# Patient Record
Sex: Female | Born: 1960 | Race: Black or African American | Hispanic: No | Marital: Married | Smoking: Former smoker
Health system: Southern US, Community
[De-identification: ages and names within clinical notes are randomized; demographics above are authoritative.]

## PROBLEM LIST (undated history)

## (undated) DIAGNOSIS — R079 Chest pain, unspecified: Secondary | ICD-10-CM

## (undated) DIAGNOSIS — I10 Essential (primary) hypertension: Secondary | ICD-10-CM

## (undated) DIAGNOSIS — C50919 Malignant neoplasm of unspecified site of unspecified female breast: Secondary | ICD-10-CM

## (undated) DIAGNOSIS — K529 Noninfective gastroenteritis and colitis, unspecified: Secondary | ICD-10-CM

## (undated) DIAGNOSIS — F329 Major depressive disorder, single episode, unspecified: Secondary | ICD-10-CM

## (undated) DIAGNOSIS — B009 Herpesviral infection, unspecified: Secondary | ICD-10-CM

## (undated) DIAGNOSIS — F32A Depression, unspecified: Secondary | ICD-10-CM

## (undated) DIAGNOSIS — T7840XA Allergy, unspecified, initial encounter: Secondary | ICD-10-CM

## (undated) DIAGNOSIS — R002 Palpitations: Secondary | ICD-10-CM

## (undated) DIAGNOSIS — L509 Urticaria, unspecified: Secondary | ICD-10-CM

## (undated) DIAGNOSIS — M1990 Unspecified osteoarthritis, unspecified site: Secondary | ICD-10-CM

## (undated) DIAGNOSIS — Z9221 Personal history of antineoplastic chemotherapy: Secondary | ICD-10-CM

## (undated) DIAGNOSIS — D649 Anemia, unspecified: Secondary | ICD-10-CM

## (undated) DIAGNOSIS — R011 Cardiac murmur, unspecified: Secondary | ICD-10-CM

## (undated) DIAGNOSIS — E7800 Pure hypercholesterolemia, unspecified: Secondary | ICD-10-CM

## (undated) DIAGNOSIS — R519 Headache, unspecified: Secondary | ICD-10-CM

## (undated) DIAGNOSIS — R51 Headache: Secondary | ICD-10-CM

## (undated) DIAGNOSIS — E119 Type 2 diabetes mellitus without complications: Secondary | ICD-10-CM

## (undated) DIAGNOSIS — Z973 Presence of spectacles and contact lenses: Secondary | ICD-10-CM

## (undated) DIAGNOSIS — G039 Meningitis, unspecified: Secondary | ICD-10-CM

## (undated) DIAGNOSIS — F419 Anxiety disorder, unspecified: Secondary | ICD-10-CM

## (undated) DIAGNOSIS — K219 Gastro-esophageal reflux disease without esophagitis: Secondary | ICD-10-CM

## (undated) DIAGNOSIS — I499 Cardiac arrhythmia, unspecified: Secondary | ICD-10-CM

## (undated) DIAGNOSIS — J309 Allergic rhinitis, unspecified: Secondary | ICD-10-CM

## (undated) DIAGNOSIS — C801 Malignant (primary) neoplasm, unspecified: Secondary | ICD-10-CM

## (undated) DIAGNOSIS — Z923 Personal history of irradiation: Secondary | ICD-10-CM

## (undated) DIAGNOSIS — G935 Compression of brain: Secondary | ICD-10-CM

## (undated) HISTORY — DX: Essential (primary) hypertension: I10

## (undated) HISTORY — DX: Anemia, unspecified: D64.9

## (undated) HISTORY — DX: Herpesviral infection, unspecified: B00.9

## (undated) HISTORY — DX: Allergy, unspecified, initial encounter: T78.40XA

## (undated) HISTORY — PX: EYE SURGERY: SHX253

## (undated) HISTORY — PX: BRAIN SURGERY: SHX531

## (undated) HISTORY — PX: TUBAL LIGATION: SHX77

## (undated) HISTORY — DX: Palpitations: R00.2

## (undated) HISTORY — DX: Malignant (primary) neoplasm, unspecified: C80.1

## (undated) HISTORY — DX: Depression, unspecified: F32.A

## (undated) HISTORY — DX: Urticaria, unspecified: L50.9

## (undated) HISTORY — DX: Chest pain, unspecified: R07.9

## (undated) HISTORY — DX: Type 2 diabetes mellitus without complications: E11.9

## (undated) HISTORY — DX: Major depressive disorder, single episode, unspecified: F32.9

## (undated) HISTORY — DX: Malignant neoplasm of unspecified site of unspecified female breast: C50.919

## (undated) HISTORY — DX: Unspecified osteoarthritis, unspecified site: M19.90

## (undated) HISTORY — PX: BREAST SURGERY: SHX581

## (undated) HISTORY — DX: Noninfective gastroenteritis and colitis, unspecified: K52.9

## (undated) HISTORY — DX: Cardiac murmur, unspecified: R01.1

## (undated) HISTORY — DX: Allergic rhinitis, unspecified: J30.9

---

## 2002-12-13 ENCOUNTER — Encounter: Payer: Self-pay | Admitting: Family Medicine

## 2002-12-13 ENCOUNTER — Ambulatory Visit (HOSPITAL_COMMUNITY): Admission: RE | Admit: 2002-12-13 | Discharge: 2002-12-13 | Payer: Self-pay | Admitting: Family Medicine

## 2003-04-06 HISTORY — PX: BREAST EXCISIONAL BIOPSY: SUR124

## 2003-04-20 ENCOUNTER — Ambulatory Visit (HOSPITAL_COMMUNITY): Admission: RE | Admit: 2003-04-20 | Discharge: 2003-04-20 | Payer: Self-pay | Admitting: General Surgery

## 2004-04-09 ENCOUNTER — Ambulatory Visit (HOSPITAL_COMMUNITY): Admission: RE | Admit: 2004-04-09 | Discharge: 2004-04-09 | Payer: Self-pay | Admitting: Family Medicine

## 2005-04-21 ENCOUNTER — Ambulatory Visit (HOSPITAL_COMMUNITY): Admission: RE | Admit: 2005-04-21 | Discharge: 2005-04-21 | Payer: Self-pay | Admitting: Family Medicine

## 2006-09-29 ENCOUNTER — Ambulatory Visit (HOSPITAL_COMMUNITY): Admission: RE | Admit: 2006-09-29 | Discharge: 2006-09-29 | Payer: Self-pay | Admitting: Internal Medicine

## 2006-10-08 ENCOUNTER — Ambulatory Visit (HOSPITAL_COMMUNITY): Admission: RE | Admit: 2006-10-08 | Discharge: 2006-10-08 | Payer: Self-pay | Admitting: Family Medicine

## 2006-10-12 ENCOUNTER — Inpatient Hospital Stay (HOSPITAL_COMMUNITY): Admission: AD | Admit: 2006-10-12 | Discharge: 2006-10-16 | Payer: Self-pay | Admitting: Family Medicine

## 2006-11-25 ENCOUNTER — Ambulatory Visit (HOSPITAL_COMMUNITY): Admission: RE | Admit: 2006-11-25 | Discharge: 2006-11-25 | Payer: Self-pay | Admitting: Family Medicine

## 2007-03-13 HISTORY — PX: ABDOMINAL HYSTERECTOMY: SHX81

## 2007-03-13 HISTORY — PX: RIGHT OOPHORECTOMY: SHX2359

## 2007-03-23 ENCOUNTER — Inpatient Hospital Stay (HOSPITAL_COMMUNITY): Admission: RE | Admit: 2007-03-23 | Discharge: 2007-03-25 | Payer: Self-pay | Admitting: Obstetrics & Gynecology

## 2007-03-23 ENCOUNTER — Encounter: Payer: Self-pay | Admitting: Obstetrics & Gynecology

## 2007-04-28 ENCOUNTER — Ambulatory Visit (HOSPITAL_COMMUNITY): Admission: RE | Admit: 2007-04-28 | Discharge: 2007-04-28 | Payer: Self-pay | Admitting: Family Medicine

## 2007-07-07 DIAGNOSIS — R079 Chest pain, unspecified: Secondary | ICD-10-CM

## 2007-07-07 HISTORY — DX: Chest pain, unspecified: R07.9

## 2007-08-18 ENCOUNTER — Ambulatory Visit: Payer: Self-pay | Admitting: Orthopedic Surgery

## 2007-09-28 ENCOUNTER — Ambulatory Visit: Payer: Self-pay | Admitting: Orthopedic Surgery

## 2007-09-30 ENCOUNTER — Telehealth: Payer: Self-pay | Admitting: Orthopedic Surgery

## 2007-10-06 ENCOUNTER — Ambulatory Visit (HOSPITAL_COMMUNITY): Admission: RE | Admit: 2007-10-06 | Discharge: 2007-10-06 | Payer: Self-pay | Admitting: Orthopedic Surgery

## 2007-10-24 ENCOUNTER — Ambulatory Visit: Payer: Self-pay | Admitting: Orthopedic Surgery

## 2007-10-24 DIAGNOSIS — IMO0002 Reserved for concepts with insufficient information to code with codable children: Secondary | ICD-10-CM | POA: Insufficient documentation

## 2007-11-10 ENCOUNTER — Telehealth: Payer: Self-pay | Admitting: Orthopedic Surgery

## 2007-11-18 ENCOUNTER — Encounter: Admission: RE | Admit: 2007-11-18 | Discharge: 2007-11-18 | Payer: Self-pay | Admitting: Orthopedic Surgery

## 2007-12-09 ENCOUNTER — Encounter: Admission: RE | Admit: 2007-12-09 | Discharge: 2007-12-09 | Payer: Self-pay | Admitting: Orthopedic Surgery

## 2008-01-02 ENCOUNTER — Ambulatory Visit: Payer: Self-pay | Admitting: Orthopedic Surgery

## 2008-02-28 ENCOUNTER — Encounter: Admission: RE | Admit: 2008-02-28 | Discharge: 2008-02-28 | Payer: Self-pay | Admitting: Orthopedic Surgery

## 2008-03-29 ENCOUNTER — Ambulatory Visit (HOSPITAL_COMMUNITY): Admission: RE | Admit: 2008-03-29 | Discharge: 2008-03-29 | Payer: Self-pay | Admitting: Family Medicine

## 2008-04-04 ENCOUNTER — Ambulatory Visit: Payer: Self-pay | Admitting: Orthopedic Surgery

## 2008-04-12 ENCOUNTER — Emergency Department (HOSPITAL_COMMUNITY): Admission: EM | Admit: 2008-04-12 | Discharge: 2008-04-12 | Payer: Self-pay | Admitting: Emergency Medicine

## 2008-05-01 ENCOUNTER — Telehealth: Payer: Self-pay | Admitting: Orthopedic Surgery

## 2008-05-01 ENCOUNTER — Encounter: Payer: Self-pay | Admitting: Orthopedic Surgery

## 2008-06-05 ENCOUNTER — Ambulatory Visit: Payer: Self-pay | Admitting: Cardiology

## 2008-06-06 ENCOUNTER — Encounter (INDEPENDENT_AMBULATORY_CARE_PROVIDER_SITE_OTHER): Payer: Self-pay | Admitting: Family Medicine

## 2008-06-06 ENCOUNTER — Observation Stay (HOSPITAL_COMMUNITY): Admission: EM | Admit: 2008-06-06 | Discharge: 2008-06-06 | Payer: Self-pay | Admitting: Emergency Medicine

## 2008-06-22 ENCOUNTER — Encounter: Payer: Self-pay | Admitting: Orthopedic Surgery

## 2008-06-28 ENCOUNTER — Emergency Department (HOSPITAL_COMMUNITY): Admission: EM | Admit: 2008-06-28 | Discharge: 2008-06-29 | Payer: Self-pay | Admitting: Emergency Medicine

## 2008-07-03 ENCOUNTER — Encounter (HOSPITAL_COMMUNITY): Admission: RE | Admit: 2008-07-03 | Discharge: 2008-08-02 | Payer: Self-pay | Admitting: Family Medicine

## 2008-07-06 DIAGNOSIS — K529 Noninfective gastroenteritis and colitis, unspecified: Secondary | ICD-10-CM

## 2008-07-06 HISTORY — DX: Noninfective gastroenteritis and colitis, unspecified: K52.9

## 2008-10-26 ENCOUNTER — Encounter
Admission: RE | Admit: 2008-10-26 | Discharge: 2008-10-26 | Payer: Self-pay | Admitting: Physical Medicine & Rehabilitation

## 2009-07-01 ENCOUNTER — Ambulatory Visit (HOSPITAL_COMMUNITY): Admission: RE | Admit: 2009-07-01 | Discharge: 2009-07-01 | Payer: Self-pay | Admitting: Family Medicine

## 2009-09-11 ENCOUNTER — Ambulatory Visit (HOSPITAL_COMMUNITY): Admission: RE | Admit: 2009-09-11 | Discharge: 2009-09-11 | Payer: Self-pay | Admitting: Family Medicine

## 2009-09-11 ENCOUNTER — Encounter: Payer: Self-pay | Admitting: Orthopedic Surgery

## 2009-10-03 ENCOUNTER — Ambulatory Visit: Payer: Self-pay | Admitting: Orthopedic Surgery

## 2009-10-03 DIAGNOSIS — IMO0002 Reserved for concepts with insufficient information to code with codable children: Secondary | ICD-10-CM | POA: Insufficient documentation

## 2009-10-03 DIAGNOSIS — M1710 Unilateral primary osteoarthritis, unspecified knee: Secondary | ICD-10-CM

## 2010-03-25 ENCOUNTER — Ambulatory Visit (HOSPITAL_COMMUNITY): Admission: RE | Admit: 2010-03-25 | Discharge: 2010-03-25 | Payer: Self-pay | Admitting: Family Medicine

## 2010-07-27 ENCOUNTER — Encounter: Payer: Self-pay | Admitting: Internal Medicine

## 2010-07-27 ENCOUNTER — Encounter: Payer: Self-pay | Admitting: Orthopedic Surgery

## 2010-08-05 NOTE — Miscellaneous (Signed)
Summary: neuro ref  Clinical Lists Changes  Orders: Added new Referral order of Neurosurgeon Referral (Neurosurgeon) - Signed

## 2010-08-05 NOTE — Progress Notes (Signed)
Summary: RE: ESI appt  Phone Note Outgoing Call   Call placed by: Cammie Sickle,  Nov 10, 2007 2:13 PM Call placed to: Patient Summary of Call: I called patient & lft msg re: ESI appt fol'g phone w/DRI/Judy.  Darel Hong has called pt & lft msg w/pt re: sched'g of first ESI. Initial call taken by: Cammie Sickle,  Nov 10, 2007 2:15 PM  Follow-up for Phone Call        called pt to fol/up. Lft msg on machine. Follow-up by: Cammie Sickle,  Nov 15, 2007 11:37 AM  Additional Follow-up for Phone Call Additional follow up Details #1::        pt had appt at [facility] today Additional Follow-up by: Cammie Sickle,  Nov 18, 2007 8:34 AM

## 2010-08-05 NOTE — Progress Notes (Signed)
Summary: Neurosurgeon referral.  Phone Note Outgoing Call   Call placed by: Waldon Reining,  May 01, 2008 12:06 PM Call placed to: Specialist Action Taken: Information Sent Summary of Call: I faxed a referral to [facility] on this patient for DDD of L-spine.

## 2010-08-05 NOTE — Assessment & Plan Note (Signed)
Summary: F/U AFTER 2ND ESI/MEDICAID/BSF    History of Present Illness: I saw [name] [name] in the office today for a followup visit.  She is a 50 years old woman with the complaint of: back pain and  leg pain    Follow up after 2nd injection, ESI.  After 2nd injection, pain came back, but not as bad.  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 1-2 as needed pain    Nabumetone 500 Mg Tabs (Nabumetone) .Marland Kitchen... 1 by mouth two times a day  Orders: Est. Patient Level II (09811)   Medications Added to Medication List This Visit: 1)  Neurontin 100 Mg Caps (Gabapentin) .... 2 by mouth hs   Patient Instructions: 1)  Continue etodolac and neurontin 2)  Walk at least 15 minutes a day 3)  Come back in 3 months for recheck on back   Prescriptions: NEURONTIN 100 MG  CAPS (GABAPENTIN) 2 by mouth hs  #60 x 2   Entered and Authorized by:   Fuller Canada MD   Signed by:   Fuller Canada MD on 01/02/2008   Method used:   Print then Give to Patient   RxID:   [id number]  ]

## 2010-08-05 NOTE — Assessment & Plan Note (Signed)
Summary: REVIEW MRI RESULTS/LUMBAR SPINE/[place] MEDICAID/CAF    History of Present Illness: I saw [name] [name] in the office today for a followup visit.  She is a 50 years old woman with the complaint of:  reviewing MRI results of lumbar spine taken 10-06-07 at [facility].  Pain still comes and goes in left hip down left leg.  Mostly at night. Not constant.  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 1-2 as needed pain    Nabumetone 500 Mg Tabs (Nabumetone) .Marland Kitchen... 1 by mouth two times a day  Orders: Misc. Referral (Misc. Ref) Est. 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  Orders: Est. Patient Level III (60454)   Problem # 3:  ARTHRITIS, BACK (ICD-721.90) Assessment: New  Orders: Est. Patient Level III (09811)   Medications Added to Medication List This Visit: 1)  Neurontin 100 Mg Caps (Gabapentin) .... 2 hs 2)  Ultracet 37.5-325 Mg Tabs (Tramadol-acetaminophen) .Marland Kitchen.. 1-2 as needed pain 3)  Nabumetone 500 Mg Tabs (Nabumetone) .Marland Kitchen.. 1 by mouth two times a day   Patient Instructions: 1)  ESI L4-5 2)  start antiinflammatory and ultracet  3)  call us after 2nd epidural shot to get appt to see [place].    Prescriptions: NABUMETONE 500 MG  TABS (NABUMETONE) 1 by mouth two times a day  #60 x 1   Entered and Authorized by:   Fuller Canada MD   Signed by:   Fuller Canada MD on 10/24/2007   Method used:   Print then Give to Patient   RxID:   [id number] ULTRACET 37.5-325 MG  TABS (TRAMADOL-ACETAMINOPHEN) 1-2 as needed pain  #90  x 1   Entered and Authorized by:   Fuller Canada MD   Signed by:   Fuller Canada MD on 10/24/2007   Method used:   Historical   RxID:   [id number]  ]

## 2010-08-05 NOTE — Progress Notes (Signed)
Summary: Neurosurgeon appointment.  Phone Note Outgoing Call   Call placed by: Waldon Reining,  May 01, 2008 4:34 PM Call placed to: Patient Action Taken: Phone Call Completed, Appt scheduled Summary of Call: I called to give the patient her appointment at [facility] with Dr. Lovell Sheehan on 06-22-08 at 9:40am. Patient is aware to take her films.

## 2010-08-05 NOTE — Letter (Signed)
Summary: External Correspondence  External Correspondence   Imported By: Elvera Maria 07/17/2008 10:55:45  _____________________________________________________________________  External Attachment:    Type:   Image     Comment:   Nelida Meuse

## 2010-08-05 NOTE — Assessment & Plan Note (Signed)
Summary: left leg still hurting wants diff. med/medicaid/bsf    History of Present Illness: I saw Alton Miskell in the office today for a followup visit.  She is a 50 years old woman with the complaint of:  left hip pain radiating to her calf.  Patient was last seen on 08-18-07 here for this and was given a dose pack. She states that it helped for a few days, but then her pain came back.    She still takes etodolac. The pain is worse at night when lying down.      Current Allergies: No known allergies      Review of Systems  Neuro      Complains of weakness.   Physical Exam  Msk:      The general apperance was normal.  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 Patient Level II (16109)    Patient Instructions: 1)  MRI L spine  2)  f/u visit     ]

## 2010-08-05 NOTE — Progress Notes (Signed)
Summary: MRI APPOINTMENT  Phone Note Outgoing Call   Call placed by: Waldon Reining,  September 30, 2007 10:29 AM Call placed to: Patient Action Taken: Phone Call Completed, Appt scheduled, Information Sent Reason for Call: Confirm/change Appt Summary of Call: I GAVE THE PATIENT HER MRI APPOINTMENT AT [place] ON 10-06-07 AT 11:00, 10:30 TO REGISTER. PATIENT HAS MEDICAID, NO PRECERT REQUIRED. PATIENT WIL FOLLOW UP HERE FOR HER RESULTS.

## 2010-08-05 NOTE — Letter (Signed)
Summary: History form  History form   Imported By: Jacklynn Ganong 10/09/2009 09:12:29  _____________________________________________________________________  External Attachment:    Type:   Image     Comment:   External Document

## 2010-08-05 NOTE — Assessment & Plan Note (Signed)
Summary: new problem rt knee pain [facility]r at [facility] 09/11/09/bcbs/bsf   Vital Signs:  Patient profile:   50 year old female Height:      62 inches Weight:      174 pounds Pulse rate:   72 / minute Resp:     16 per minute  Vitals Entered By: Fuller Canada MD (October 03, 2009 9:30 AM)  Visit Type:  new problem Referring Provider:  Dr. Lubertha South Primary Provider:  Dr. Gerda Diss  CC:  right knee pain.  History of Present Illness:  50 year-old female with RIGHT knee pain for several years. Denies any injury. She is on diclofenac for back pain. We saw her for her back sometime in the past, and we send her to neurosurgery and she is now on diclofenac twice a day. This does not seem to help her knee pain. She denies any swelling complains of numbness and tingling pain when she is climbing stairs. Her pain is dull and throbbing it comes and goes. She gets up in the morning and night not after exercise. Other than the stairclimbing. It started gradually    Xrays right knee [facility] 09/11/09.  Meds: Atenolol, Citalopram,, Diclofenac 75 two times a day, Tylenol arthritis.  FYI has been seen in our office for back pain, we referred to Neurosurgeon.    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 Cardiovascular:  Complains of palpitations and murmurs; denies chest pain and fainting. Respiratory:  Complains of short of breath; denies wheezing, couch, tightness, pain on inspiration, and snoring . Gastrointestinal:  Complains of nausea; denies heartburn, vomiting, diarrhea, constipation, and blood in your stools. Genitourinary:  Denies frequency, urgency, difficulty urinating, painful urination, flank pain, and bleeding in urine. Neurologic:  Complains of numbness and tingling; denies unsteady  gait, dizziness, tremors, and seizure. Musculoskeletal:  Complains of joint pain and stiffness; denies swelling, instability, redness, heat, and muscle pain. Endocrine:  Denies excessive thirst, exessive urination, and heat or cold intolerance. Psychiatric:  Complains of anxiety; denies nervousness, depression, and hallucinations. Skin:  Denies changes in the skin, poor healing, rash, itching, and redness. HEENT:  Denies blurred or double vision, eye pain, redness, and watering. Immunology:  Denies seasonal allergies, sinus problems, and allergic to bee stings. Hemoatologic:  Complains of brusing; denies easy bleeding.  Physical Exam  Additional Exam:  GEN: well developed, well nourished, normal grooming and hygiene, no deformity and normal body habitus.   CDV: pulses are normal, no edema, no erythema. no tenderness  Lymph: normal lymph nodes   Skin: no rashes, skin lesions or open sores   NEURO: normal coordination, reflexes, sensation.   Psyche: awake, alert and oriented. Mood normal   Gait: minimal  RIGHT knee No effusion, but quite a bit of medial joint line tenderness Full range of motion Normal strength No instability  Her McMurray's signs seem to be positive     Impression & Recommendations:  Problem # 1:  DEGENERATIVE JOINT DISEASE, RIGHT KNEE (ICD-715.96) Assessment New  4 views of the knee were done at the hospital. She has mild arthritic changes noted. Report reviewed and films reviewed.  We gave her a RIGHT knee injection started her on some exercises. No surgical treatment needed at this time.  Verbal consent was obtained. The knee was prepped with alcohol and  ethyl chloride. 1 cc of depomedrol 40mg /cc and 4 cc of lidocaine 1% was injected. there were no complications.  Her updated medication list for this problem includes:    Diclofenac Sodium 75 Mg Tbec (Diclofenac sodium) .Marland Kitchen... 1 by mouth two times a day  Orders: New Patient Level III (16109) Joint  Aspirate / Injection, Large (20610) Depo- Medrol 40mg  (J1030)  Patient Instructions: 1)  You have received an injection of cortisone today. You may experience increased pain at the injection site. Apply ice pack to the area for 20 minutes every 2 hours and take 2 xtra strength tylenol every 8 hours. This increased pain will usually resolve in 24 hours. The injection will take effect in 3-10 days.  2)  Exercises for the knee 3 x a week for the next 6 weeks  3)  Please schedule a follow-up appointment as needed.

## 2010-10-01 ENCOUNTER — Ambulatory Visit (INDEPENDENT_AMBULATORY_CARE_PROVIDER_SITE_OTHER): Payer: BC Managed Care – PPO | Admitting: Internal Medicine

## 2010-10-01 DIAGNOSIS — N824 Other female intestinal-genital tract fistulae: Secondary | ICD-10-CM

## 2010-10-01 DIAGNOSIS — R198 Other specified symptoms and signs involving the digestive system and abdomen: Secondary | ICD-10-CM

## 2010-10-05 HISTORY — PX: COLONOSCOPY: SHX174

## 2010-10-06 ENCOUNTER — Encounter (INDEPENDENT_AMBULATORY_CARE_PROVIDER_SITE_OTHER): Payer: BC Managed Care – PPO | Admitting: Internal Medicine

## 2010-10-06 ENCOUNTER — Ambulatory Visit (HOSPITAL_COMMUNITY)
Admission: RE | Admit: 2010-10-06 | Discharge: 2010-10-06 | Disposition: A | Discharge status: Home / Self Care | Payer: BC Managed Care – PPO | Source: Ambulatory Visit | Attending: Internal Medicine | Admitting: Internal Medicine

## 2010-10-06 ENCOUNTER — Other Ambulatory Visit (INDEPENDENT_AMBULATORY_CARE_PROVIDER_SITE_OTHER): Payer: Self-pay | Admitting: Internal Medicine

## 2010-10-06 DIAGNOSIS — K6289 Other specified diseases of anus and rectum: Secondary | ICD-10-CM | POA: Insufficient documentation

## 2010-10-06 DIAGNOSIS — R198 Other specified symptoms and signs involving the digestive system and abdomen: Secondary | ICD-10-CM | POA: Insufficient documentation

## 2010-10-06 DIAGNOSIS — R6889 Other general symptoms and signs: Secondary | ICD-10-CM

## 2010-10-06 DIAGNOSIS — K6389 Other specified diseases of intestine: Secondary | ICD-10-CM

## 2010-10-12 NOTE — Consult Note (Addendum)
[name], [name]             ACCOUNT NO.:  [id number]  MEDICAL RECORD NO.:  [id number]           PATIENT TYPE:  LOCATION:                                 FACILITY:  PHYSICIAN:  Lionel December, M.D.    DATE OF BIRTH:  1961-06-17  DATE OF CONSULTATION: DATE OF DISCHARGE:                                CONSULTATION   REASON FOR CONSULTATION:  Change in her bowel habits.  HISTORY OF PRESENT ILLNESS:  [name] [name] is a 50 year old black female, presenting today with complaints that when she eats she feels bubbles in her stomach and her stomach gurgles.  She states that she is not having any abdominal pain.  She also states when she has BM, she has to push and strain to have a BM.  Her symptoms have been occurring for over a year.  She also states that after she eats that within 30 minutes, she feels the urge to go to the bathroom and does not make it.  She will have stool coming out of her vagina.  This has been occurring for about a month.  She says when her stools come from her vagina they are mushy. Her last GYN exam was 6 months ago and it was normal.  Her last episode of stool coming from her vagina was about a month ago.  She says her appetite has been good.  There has been no weight loss.  She usually has a bowel movement about 3 times a week.  She has had no rectal bleeding. She does give a history of having blood in her urine and she has followed up with a urologist with that.  When she has a bowel movement, they are usually normal size.  She does say that sometimes her stools are long and pencil thin.  She does state she occasionally has mucous if she uses a suppository, though she is not constipated.  She denies any acid reflux.  There has been no melena or rectal bleeding.  ALLERGIES:  There are no known allergies.  HOME MEDICATIONS:  Include: 1. Protonix 40 mg a day. 2. Estrogen 1 mg a day. 3. Atenolol 50 mg a day. 4. Meloxicam 50 mg as needed. 5. Citalopram 20 mg 3  a day. 6. Valacyclovir 1 g a day. 7. Tylenol PM one at night. 8. Mega Red fish oil one a day.  She has had a partial hysterectomy.  She has a history of heart palpitations and arthritis.  FAMILY HISTORY:  Her mother is deceased from a brain aneurysm.  Her father is deceased from an MI at age 33.  She has 3 sisters in good health, 6 brothers in good health, 1 brother is deceased with lung cancer.  SOCIAL HISTORY:  She is married.  She works at [employer].  She does not drink, smoke, or do drugs.  She has 4 children in good health.  OBJECTIVE:  She has natural teeth.  Her oral mucosa is moist.  Her conjunctivae is pink.  Her sclerae are anicteric.  Her thyroid is normal.  There is no cervical lymphadenopathy.  Her lungs are clear. Her abdomen is  soft.  Bowel sounds are positive.  Her stool was guaiac negative today.  There is no edema to her extremities.  ASSESSMENT:  [name] [name] is a 50 year old female presenting with changes in her bowel habits.  She also is given a history that she is having stool from her vagina at times.  A fistula does need to be ruled out.  RECOMMENDATIONS:  We will schedule a diagnostic colonoscopy with Dr. Karilyn Cota in the near future.  Once we have the colonoscopy, she will probably be referred to a gynecologist and further recommendations once the colonoscopy is complete.    ______________________________ Dorene Ar, NP   ______________________________ Lionel December, M.D.    TS/MEDQ  D:  10/01/2010  T:  10/02/2010  Job:  [id number]  cc:   Dr. Gerda Diss  Electronically Signed by Dorene Ar PA on 10/07/2010 08:43:23 AM Electronically Signed by Lionel December M.D. on 10/12/2010 02:02:12 PM

## 2010-10-12 NOTE — Op Note (Signed)
  [name] [name], [name] [name]             ACCOUNT NO.:  [id number]  MEDICAL RECORD NO.:  [id number]           PATIENT TYPE:  O  LOCATION:  [facility]                          FACILITY:  [facility]  PHYSICIAN:  Lionel December, M.D.    DATE OF BIRTH:  [birth year]  DATE OF PROCEDURE: DATE OF DISCHARGE:                              OPERATIVE REPORT   PROCEDURE:  Colonoscopy.  INDICATIONS:  [name] [name] is a 50 year old African American female who has noted change in her bowel habits for the last 1 year.  At times, she feels constipated, other times she is unable to defecate or may have an urgency.  She also gives history of passing stool per vagina.  She has not experienced any rectal bleeding or melena.  FAMILY HISTORY:  Negative for IBD or CRC.  Procedure risk reviewed with the patient, informed consent was obtained.  MEDS FOR CONSCIOUS SEDATION:  Demerol 50 mg IV, Versed 6 mg IV in divided dose.  FINDINGS:  Procedure performed in endoscopy suite.  The patient's vital signs and O2 sat were monitored during the procedure and remained stable.  The patient was placed in left lateral recumbent position. Rectal examination performed.  No abnormality noted on external or digital exam.  Pentax videoscope was placed through rectum and advanced under vision into sigmoid colon and beyond.  Preparation was excellent. Scope was passed into cecum which was identified by appendiceal orifice and ileocecal valve.  Short segment of GI was also examined and was normal.  As the scope was withdrawn, colonic mucosa was carefully examined and was normal throughout.  Mucosa at proximal rectum was normal.  The distal rectal mucosa revealed loss of vascularity and friability and few small linear erosions.  Biopsy was taken for histology.  Scope was retroflexed to examine anorectal junction, which was otherwise unremarkable.  Endoscope was then withdrawn.  Withdrawal time was 11 minutes.  The patient tolerated the procedure  well.  FINAL DIAGNOSES: 1. Normal terminal ileum. 2. 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 3. No changes or abnormalities noted to suggest rectovaginal fistula. 4. 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  RECOMMENDATIONS:  High-fiber diet plus fiber supplement 3 to 4 g daily. If she becomes constipated, she can take Colace 2 tablets daily.  She will make an appointment to see Dr. Despina Hidden for pelvic exam.     Lionel December, M.D.     NR/MEDQ  D:  10/06/2010  T:  10/07/2010  Job:  [id number]  cc:   Lorin Picket A. Gerda Diss, MD Fax: [phone]  Lazaro Arms, M.D. Fax: [phone]  Electronically Signed by Lionel December M.D. on 10/12/2010 02:02:17 PM

## 2010-11-18 NOTE — Consult Note (Signed)
[name] [name], [name] [name]             ACCOUNT NO.:  [id number]   MEDICAL RECORD NO.:  [id number]          PATIENT TYPE:  OBV   LOCATION:  A318                          FACILITY:  [facility]   PHYSICIAN:  Gerrit Friends. Dietrich Pates, MD, FACCDATE OF BIRTH:  1960-10-16   DATE OF CONSULTATION:  06/06/2008  DATE OF DISCHARGE:                                 CONSULTATION   REFERRING PHYSICIAN:  Donna Bernard, M.D.   REASON FOR CONSULTATION:  Chest pain.   HISTORY OF PRESENT ILLNESS:  Ms. Allinson is a 50 year old female patient  with no known history of CAD who has taken atenolol for a number of  years secondary to history of palpitations who was sitting in class  yesterday around 8 p.m. when she developed sudden onset of mid thoracic  back pain that she described as a dull ache.  She had never had this  before.  There was no radiation of her pain but she did begin to note  some discomfort in her left arm as well as her jaw.  She tried multiple  things to make herself feel better.  Lying down on her back actually  made her chest feel somewhat heavy.  She denied any associated shortness  of breath, nausea or diaphoresis.  She denied any syncope.  She really  denies any aggravating factors.  She came to the emergency room by  private vehicle and was given nitroglycerin.  She says that one  sublingual nitroglycerin help resolve her back pain.  She did undergo CT  scanning secondary to an elevated D-dimer that was negative for  pulmonary embolism, aortic aneurysm or dissection.  Her cardiac enzymes  have been negative thus far and she is currently pain-free.  We are now  asked to further evaluate.   PAST MEDICAL HISTORY:  She has a history of iron deficiency anemia in  the setting of fibroid tumors.  She is status post total abdominal  hysterectomy and right salpingo-oophorectomy in September 2008.  She  also has a history palpitations on beta-blocker therapy as outlined  above.  She is status post  bilateral tubal ligation as well as left  lumpectomy secondary to a benign mass in October of 2004.  She did have  an echocardiogram in October 2005 that demonstrated a normal ejection  fraction of 55-65%.  She denies any history of hypertension, diabetes,  hyperlipidemia.  Anxiety.   ALLERGIES:  DOXYCYCLINE.   HOME MEDICATIONS:  1. Atenolol 50 mg 1 tablet daily.  2. Pepcid 20 mg 1 tablet daily p.r.n.  3. Clonazepam as directed.  4. Trazodone 50 mg q.h.s.   SOCIAL HISTORY:  The patient lives in [place].  She is unmarried.  She has four children.  She used to work part time at the [employer] but is currently unemployed.  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  She smoked cigarettes for approximately 8 years at less than a pack per  day and quit some 10 years ago.  She denies alcohol or drug abuse.   FAMILY HISTORY:  Significant for a stroke in her mother  in her 69s and  myocardial infarction in her father in his 2s.   REVIEW OF SYSTEMS:  Please see HPI.  She denies any fevers, chills,  dysuria, hematuria, nausea, vomiting, diarrhea, recent bright red blood  per rectum or melena.  She does have a history of what sounds like  hemorrhoidal bleeding in the distant past.  She denies dysphagia or  odynophagia.  She denies orthopnea, PND or pedal edema.  The rest of the  review of systems are negative.   PHYSICAL EXAM:  GENERAL:  She is a well-nourished, well-developed female  in no acute distress.  VITAL SIGNS:  Blood pressure is 118/57, pulse 66, respirations 18,  temperature 98.1, oxygen saturation is 99% on room air.  HEENT:  Normal.  NECK:  Without JVD.  LYMPHS:  Without lymphadenopathy.  ENDOCRINE:  Without thyromegaly.  CARDIAC:  Normal S1, S2.  Regular rate and rhythm.  No murmurs, clicks  or gallops.  LUNGS:  Clear to auscultation bilaterally.  No wheezing, no rhonchi, no  rales.  SKIN:  Without rash.  ABDOMEN:  Soft, nontender with normoactive bowel sounds.   No  organomegaly.  EXTREMITIES:  Without clubbing, cyanosis or edema.  MUSCULOSKELETAL:  Without joint deformity.  NEUROLOGIC:  She is alert and oriented x3.  Cranial nerves II-XII  grossly intact.   Chest x-ray no acute disease.  Chest CT as outlined above.  EKG sinus  rhythm, heart rate 71, normal axis, nonspecific ST-T wave changes, poor  R-wave progression.   LABS:  White count 12,400, hemoglobin 11.7, hematocrit 35.7, platelet  count 200,000, sodium 139, potassium 3.8, BUN 6, creatinine 0.77,  glucose 112, D-dimer 2.3, BNP less than 30.  Point of care markers  negative x2.  CK 114, CK-MB 4.5, troponin-I 0.05, INR 1.0, magnesium  1.9.   ASSESSMENT AND PLAN:  1. Atypical chest and back pain in a 50 year old female patient with a      remote history of tobacco abuse in the past and cardiac risk      factors only significant for coronary artery disease albeit in a      female over 9 years old.  Her cardiac enzymes have been negative      thus far.  Her chest x-ray and chest CT have also been unremarkable      as well as her EKG.  Her pain is resolved now.  She does note some      discomfort with lying down yesterday.  Also her discomfort did      improve with nitroglycerin.  We will obtain an echocardiogram as      well as cycle her enzymes further.  After further review with Dr.      Dietrich Pates we have decided to proceed with inpatient stress Myoview      testing today.  She will be done later this afternoon.  She will be      continued on aspirin therapy as well as p.r.n. nitroglycerin for      now.  Further recommendations to follow once we know the results of      her stress test.  2. Palpitations.  She is probably experiencing symptomatic PVCs.  She      has a good number of these on telemetry.  Her atenolol will be      increased to 75 mg daily.  As noted above an echocardiogram will be      checked and TSH will also be checked.  3. Leukocytosis.  This will be per her primary  care physician.   Thank you very much for the consultation.  We will be glad to follow the  patient throughout the remainder of this admission.  Further  recommendations to follow.      Tereso Newcomer, PA-C      Gerrit Friends. Dietrich Pates, MD, [facility]  Electronically Signed    SW/MEDQ  D:  06/06/2008  T:  06/06/2008  Job:  [id number]

## 2010-11-18 NOTE — H&P (Signed)
NAMERUMAYSA, SABATINO NO.:  [id number]   MEDICAL RECORD NO.:  [id number]          PATIENT TYPE:  AMB   LOCATION:  DAY                           FACILITY:  [facility]   PHYSICIAN:  Lazaro Arms, M.D.   DATE OF BIRTH:  April 10, 1961   DATE OF ADMISSION:  03/23/2007  DATE OF DISCHARGE:  LH                              HISTORY & PHYSICAL   [name] [name] is a 50 year old African-American female whom I saw in  consultation in the hospital back in April of this year.  She is gravida  4, para 4, status post tubal ligation who was admitted for acute onset  of abdominal pain.  She was found to have a significantly enlarged  fibroid uterus with degeneration present.  Her uterus was 18 to 20-week  size.  She was just beginning a new job and wanted to delay surgery  until she started it and at this point has subsequently unfortunately  been laid off.  As a result, now would be a good time to proceed.  Again, on exam, she does have an 18 to 20-week size fibroid uterus.  She  was also significantly anemic, and we gave her intravenous iron when she  was in the hospital previously.  Her most recent hemoglobin and  hematocrit were stable at 11 and 33, respectively.   PAST MEDICAL HISTORY:  Hypertension for which she takes atenolol.   PAST SURGICAL HISTORY:  1. Tubal ligation.  2. Breast biopsy.   PAST OBSTETRICAL HISTORY:  Four vaginal deliveries.   REVIEW OF SYSTEMS:  As per the HPI, otherwise negative.   PHYSICAL EXAMINATION:  HEENT:  Unremarkable.  NECK:  Thyroid is normal.  LUNGS:  Clear.  HEART:  Regular rate and rhythm without regurgitation or gallop.  BREASTS:  Deferred.  ABDOMEN:  Benign.  No hepatosplenomegaly.  She does have palpated uterus  in the midline up to the umbilicus.  PELVIC:  Normal external genitalia.  Vagina pink and moist without  discharge.  Cervix is parous without lesions.  Uterus is 18 to 20-week  size.  Adnexa cannot be palpated because the uterus extends  to the  pelvic sidewalls.  EXTREMITIES:  Warm with no edema.  NEUROLOGIC:  Exam is grossly intact.   IMPRESSION:  1. Enlarged fibroid uterus, 18 to 20-week size.  2. Pelvic pain.  3. Menorrhagia.  4. Resolved anemia.   PLAN:  The patient is admitted for TAH.  She understands the risks,  benefits, indications, and alternatives and will proceed.      Lazaro Arms, M.D.  Electronically Signed     LHE/MEDQ  D:  03/22/2007  T:  03/22/2007  Job:  [id number]

## 2010-11-18 NOTE — Discharge Summary (Signed)
[name], [name]             ACCOUNT NO.:  [id number]   MEDICAL RECORD NO.:  [id number]          PATIENT TYPE:  INP   LOCATION:  A320                          FACILITY:  [facility]   PHYSICIAN:  Lazaro Arms, M.D.   DATE OF BIRTH:  Jun 05, 1961   DATE OF ADMISSION:  03/23/2007  DATE OF DISCHARGE:  09/19/2008LH                               DISCHARGE SUMMARY   DISCHARGE DIAGNOSES:  1. Status post total abdominal hysterectomy and right salpingo-      oophorectomy.  2. Unremarkable postoperative course.   PROCEDURE:  TAH/RSO.   Please refer to the history and physical and the operative report for  details of admission to the hospital.   HOSPITAL COURSE:  The patient was admitted postoperatively.  She did  quite well.  She tolerated clear liquids and a regular diet well without  symptoms, was ambulatory and progression with normal early bowel  function.  Her incision was clean, dry, and intact.  She remained  afebrile.  Her hemoglobin and hematocrit dropped down to her levels that  she had back in April at 9 and 27 and then 8.6 and 26.7.  At the time of  discharge, her white count was 11,500.  This was an expected drop with  her intraoperative blood loss and the size of her uterus.  She was  extensively ambulatory without symptoms.  She was discharged to home on  the morning of postop #2 in good, stable condition to follow up in the  office next week on the 24th as scheduled.  She was given Percocet 5/325  for pain.  We will stop her iron for now.  We will restart it probably  about 2 weeks after surgery.      Lazaro Arms, M.D.  Electronically Signed     LHE/MEDQ  D:  03/25/2007  T:  03/25/2007  Job:  [id number]

## 2010-11-18 NOTE — H&P (Signed)
NAMEPHELAN, GOERS             ACCOUNT NO.:  [id number]   MEDICAL RECORD NO.:  [id number]          PATIENT TYPE:  OBV   LOCATION:  A318                          FACILITY:  [facility]   PHYSICIAN:  Dorris Singh, DO    DATE OF BIRTH:  [birth year]   DATE OF ADMISSION:  06/05/2008  DATE OF DISCHARGE:  LH                              HISTORY & PHYSICAL   CHIEF COMPLAINT:  The patient is a 50 year old African American female  who presented to the emergency room with a chief complaint of back pain.   HISTORY OF PRESENT ILLNESS:  The patient is a patient Dr. Fletcher Anon and  she came in because this back pain on the left side started to radiate  to her jaw.  She took a Zantac without any relief.  She did not say that  she had any chest pain or epigastric pain but the back pain started  today and it was waxing and waning.  It was located in the bilateral  midline of her back and radiated to the right jaw and right neck and is  characterized as a dull pain, 8 out of 10 at is worst.  The pain is now  2 out of 10 while she was the emergency room.  It was aggravated by  nothing.  It was relieved by nothing.   PAST MEDICAL HISTORY:  The patient's past medical history significant  for:  1. Arthritis.  2. Palpitations.   FAMILY HISTORY:  Significant for CAD and cancer.   PAST SURGICAL HISTORY:  She has had hysterectomy.   SOCIAL HISTORY:  She is nonsmoker, nondrinker.  No drug abuse.  Lives  with daughter.   ALLERGIES:  None.   MEDICATIONS:  1. Atenolol 50 mg once a day.  2. Clonazepam.  There is 40, but I do not think that is the right dose      once a day.  3. Pepcid 20 mg.   She has some other medication she does not know the need for.   REVIEW OF SYSTEMS:  CONSTITUTIONALLY: Negative for fever, chills.  HEENT:  Negative for eye pain or visual changes.  Negative for nasal  congestion and sore throat.  CARDIOVASCULAR:  Positive for right-sided  chest pain.  RESPIRATORY:  Negative for  cough, shortness of breath.  GASTROINTESTINAL:  Negative for vomiting or diarrhea.  GU: Negative  dysuria, frequency. MUSCULOSKELETAL: Positive for back pain.  SKIN:  Negative for pruritus or rash.  NEURO:  Negative for headache and  weakness.  PSYCHIATRIC:  Negative for anxiety, depression.   PHYSICAL EXAMINATION:  VITAL SIGNS:  Blood pressure 120/64, pulse rate  64, respirations 20, temperature 97.8.  GENERAL:  The patient is a well-developed, well-nourished [place]  American female who is in no acute distress.  HEENT:  Head is normocephalic, atraumatic.  Eyes are EOMI.  PERRLA.  Ears: PMI visualized bilaterally.  Turbinates are moist.  NECK:  Throat is clear.  No erythema noted.  HEART:  Regular rate and rhythm.  No murmur, rub or gallop.  LUNGS:  Clear to auscultation bilaterally.  No rales, wheezes or  rhonchi.  ABDOMEN:  Soft, nontender and no organomegaly.  Bowel sounds are  present.  EXTREMITIES:  No edema, ecchymosis or cyanosis.  Good strength.  NEURO:  Cranial II-XII are grossly intact and the patient is alert and  oriented x3.   LABORATORY DATA:  The patient's EKG is 69 beats per minute. Normal sinus  rhythm with PVCs.  CT scan shows no acute changes.  Her cardiac markers  were negative. Two-view of the chest showed no acute disease.  She had  two sets of cardiac markers.  Second set was also negative.  Sodium was  137, potassium 3.8, chloride 104, carbon dioxide 28, glucose 108, BUN 9,  creatinine 0.79.  White count 13.4, hemoglobin 12.8, hematocrit 31.8,  platelet count 234,000.   ASSESSMENT/PLAN:  Chest pain.  Will have the patient admitted to  observation.  Will go ahead and order cardiology consult.  Will repeat  her cardiac markers.  Due to it being on the right side as well, will  get an abdominal ultrasound and do deep venous thrombosis and  gastrointestinal prophylaxis.  Will also place her on her home  medications and will transfer her care to Dr. Gerda Diss on  December 2.  We  will continue to monitor and change therapy as necessary.      Dorris Singh, DO  Electronically Signed     CB/MEDQ  D:  06/06/2008  T:  06/06/2008  Job:  [id number]

## 2010-11-18 NOTE — Op Note (Signed)
NAMECORI, JUSTUS             ACCOUNT NO.:  [id number]   MEDICAL RECORD NO.:  [id number]          PATIENT TYPE:  INP   LOCATION:  A320                          FACILITY:  [facility]   PHYSICIAN:  Lazaro Arms, M.D.   DATE OF BIRTH:  05-29-1961   DATE OF PROCEDURE:  03/23/2007  DATE OF DISCHARGE:                               OPERATIVE REPORT   PREOPERATIVE DIAGNOSES:  1. Enlarged fibroid uterus.  2. Pelvic pain.  3. Dysmenorrhea.  4. Anemia, resolved on intravenous iron.   POSTOPERATIVE DIAGNOSES:  1. Enlarged fibroid uterus.  2. Pelvic pain.  3. Dysmenorrhea.  4. Anemia, resolved on intravenous iron.   PROCEDURE:  Total abdominal hysterectomy and right salpingo-  oophorectomy.   SURGEON:  Lazaro Arms, M.D.   ANESTHESIA:  General endotracheal anesthesia.   FINDINGS:  The patient had an 18-20-week size fibroid uterus with  multiple, probably greater than 10 fibroids present.  Her right tube and  ovary, I did try to salvage those.  It was because of an enlarged  uterus, really there was no room between the ovary and the tube, and in  taking the utero-ovarian ligament down, I was unable to spare the  ovarian vein.  I left it in to see if it would stop bleeding by the time  we finished, so I had to take that because there was really no room  between the ovary and the uterus.   DESCRIPTION OF PROCEDURE:  The patient was taken to the operating room  and placed in the supine position where she underwent general  endotracheal anesthesia.  The vagina was prepped.  The Foley catheter  was placed.  The abdomen was prepped and draped in the usual sterile  fashion.  A Pfannenstiel skin incision was made and carried down sharply  to the rectus fascia and scored in the midline and extended laterally.  The fascia was taken off the muscle superiorly and inferiorly without  difficulty.  The muscles were divided.  The peritoneal cavity was  entered.  The uterus was delivered through  the incision.  The uterine  cornu were grasped.  The round ligament was suture ligated and cut.  The  utero-ovarian ligaments were isolated.  A vascular window was made  bilaterally.  They were clamped, cut and double suture ligated.  There  was really no room between the right ovary and the uterus, and  liberating the ovary, the ovarian vein was oozing, and I decided to wait  until the end of the procedure to see if it would stop.  Of course it  did not.  The vesicouterine serosal flap was created.  The uterine  vessels were skeletonized, clamped, cut and suture ligated.  A pedicle  was taken down the cervix through the Cardinal ligament and the pedicle  was clamped, cut and suture ligated.  The uterine fundus was amputated.  An Alexis self-retaining wound retractor was then placed.  The upper  abdomen was then packed away.  Serial pedicles were taken down the  cervix through the Cardinal ligament, each pedicle being clamped, cut  and suture  ligated.  The vagina was then cross-clamped.  The cervix was  removed.  The vaginal angles were sutured in place.  The vagina was  closed with interrupted figure-of-eight sutures.  The pelvis was  irrigated vigorously.  She did have a large vein  running horizontally  along the bladder, and a couple of interrupted #3-0 Monocryl sutures  were placed, because of the bleeding from this sort of venous plexus on  the bladder surface.  We stayed very superficial.  Really did more of an  imbricating suture than anything else.  The pelvis was irrigated  vigorously.  I then clamped the infundibular pelvic ligament on the  right.  There was a double suture ligation.  I removed the right tube  and ovary.  All pedicles were then found to be hemostatic.  Intercede  was placed over the vaginal cuff.  All specimens were removed and sent  to pathology.  The self-retaining retractor was removed.  The upper  abdomen wound packs were removed.  The muscles and peritoneum  were  reapproximated loosely.  The fascia was closed using #0 Vicryl running.  The subcutaneous tissues were made hemostatic and irrigated.  The skin  was closed using skin staples.   The patient tolerated the procedure well.  She experienced 500 mL of  blood loss.  She was taken to the recovery room in good stable  condition.  All counts were correct x3.  She received Ancef  prophylactically.      Lazaro Arms, M.D.  Electronically Signed     LHE/MEDQ  D:  03/23/2007  T:  03/23/2007  Job:  [id number]

## 2010-11-21 NOTE — Op Note (Signed)
   NAMENORVELL, URESTE                       ACCOUNT NO.:  [id number]   MEDICAL RECORD NO.:  [id number]                   PATIENT TYPE:  AMB   LOCATION:  DAY                                  FACILITY:  [facility]   PHYSICIAN:  Jerolyn Shin C. Katrinka Blazing, M.D.                DATE OF BIRTH:  1960/09/23   DATE OF PROCEDURE:  04/20/2003  DATE OF DISCHARGE:                                 OPERATIVE REPORT   PREOPERATIVE DIAGNOSIS:  Left breast mass.   POSTOPERATIVE DIAGNOSIS:  Left breast mass.   PROCEDURE:  Left partial mastectomy.   SURGEON:  Dirk Dress. Katrinka Blazing, M.D.   DESCRIPTION:  Under general anesthesia, the patient's left breast was  prepped and draped in a sterile field.  Supraareolar curvilinear incision  was made from about the 11 o'clock position to the 1 o'clock position.  The  mass was grasped with an Allis clamp.  Using electrocautery, the mass was  totally excised.  It was sent to pathology.  Deep tissues were closed with 3-  0 and 2-0 Biosyn.  Skin was closed with subcuticular 4-0 Dexon.  The patient  tolerated the procedure well.  Sterile dressing was placed.  She was  awakened from anesthesia uneventfully, transferred to a bed, an taken to the  postanesthetic care unit for further monitoring.      ___________________________________________                                            Dirk Dress. Katrinka Blazing, M.D.   LCS/MEDQ  D:  04/20/2003  T:  04/20/2003  Job:  [id number]

## 2010-11-21 NOTE — Consult Note (Signed)
[name] [name], [name] [name]             ACCOUNT NO.:  [id number]   MEDICAL RECORD NO.:  [id number]          PATIENT TYPE:  INP   LOCATION:  A302                          FACILITY:  [facility]   PHYSICIAN:  Lazaro Arms, M.D.   DATE OF BIRTH:  Mar 14, 1961   DATE OF CONSULTATION:  10/13/2006  DATE OF DISCHARGE:                                 CONSULTATION   [name] [name] is a 50 year old African-American lady gravida 4, para 4,  status post a tubal ligation who has been having lower pelvic and  abdominal pain for the past 4 days.  She is known to have an enlarged  fibroid uterus over the past couple of years.  She states her periods  remain pretty regular, she has, occasionally they are heaving with  clotting but for the most part they are just pretty normal according to  her history.  Unrelated to bleeding, she began hurting 4 days ago,  having low grade fever, a little nausea, no vomiting, no diarrhea.  She  presented to Dr. Gerda Diss, did a white count, she had a white count done  yesterday which was 20,000 and he called me, we talked about the  clinical scenario and I thought it was a good chance she could have a  degenerating myoma vs. pelvic inflammatory disease and we discussed  those possibilities.  He felt like she needed to come in for hospital  management.  He also decided to get a CT scan, to evaluate for any other  intraabdominal or pelvic processes, which I concurred with.  She was  admitted, placed on doxycycline and Rocephin and is to get her CT scan  this morning.  Her white blood cell count this morning is down to  15,000.  Her white blood cell count this morning is down to 15,000.  She  does have iron deficiency anemia, her hemoglobin is 9.8, hematocrit  29.5, her MCV is 74.5 and RDW is high at 15.6, all pointing to an iron  deficiency process probably consistent with chronically heavy menses.  Additionally, the patient has an elevated glucose of 215 and Dr. Gerda Diss  is going to  evaluate that as well.   PAST MEDICAL HISTORY:  Hypertension.  She takes atenolol.   PAST SURGICAL HISTORY:  She has had a tubal ligation and a breast biopsy  which was benign.   PAST OB:  She has had 4 vaginal deliveries.   REVIEW OF SYSTEMS:  As per HPI, otherwise negative.   SOCIALLY:  She is unemployed.  Does not drink or smoke.   DIRECT HISTORY AND PHYSICAL:  UPPER ABDOMINAL EXAM:  Benign.  No  tenderness at all.  LOWER ABDOMINAL EXAM:  She is quite tender with some voluntary guarding  but no rebound.  PELVIC EXAM:  Deferred.  Dr. Gerda Diss performed that yesterday.  She has  had an ultrasound which revealed, that was on the 4th, she had an  ultrasound which showed normal endometrial stripe, enlarged fibroid,  uterus some fibroids, the largest of which was 6.1 x 5.5 x 4.5 cm and  there was no mention of central necrosis  or fluid consistent with  degeneration.   IMPRESSION:  Enlarged fibroid uterus with degeneration versus pelvic  inflammatory disease or both.   PLAN:  Patient is admitted to have IV doxycycline and Rocephin.  Certainly, in light of an elevated white count and tenderness and some  increased discharge that she reports, cultures are pending, but I want  to cover her for the next week or so to ensure there is no infectious  process.  If indeed her pain continues, and I think it will, as well as  her overall history, I have recommended an abdominal hysterectomy and  she understands and agrees with that recommendation.  At the present,  barring any unforeseen problems, I recommend she have 48 hours of IV  antibiotics and then to be discharged home on oral antibiotics and I  will set her up for a hysterectomy next week.  We will await CT scan  report and, of course, follow her serial white counts.  I am also going  to, in the meantime, order some intravenous iron while she is in the  hospital to help build up her iron stores both in the short term and  long  term.      Lazaro Arms, M.D.  Electronically Signed     LHE/MEDQ  D:  10/13/2006  T:  10/13/2006  Job:  [id number]   cc:   Donna Bernard, M.D.  Fax: [phone]

## 2010-11-21 NOTE — H&P (Signed)
   [name] [name], [name] [name]                       ACCOUNT NO.:  [id number]   MEDICAL RECORD NO.:  [id number]                   PATIENT TYPE:  AMB   LOCATION:  DAY                                  FACILITY:  [facility]   PHYSICIAN:  Jerolyn Shin C. Katrinka Blazing, M.D.                DATE OF BIRTH:  1960-10-31   DATE OF ADMISSION:  04/19/2003  DATE OF DISCHARGE:                                HISTORY & PHYSICAL   A 50 year old female with a history of a palpable mass of her left breast  for about three weeks.  The patient had a mammogram done three months ago  that was normal. She denies pain.  She has not had nipple drainage.  There  is no family history of breast disease, tumors, or cancers. She has not had  hormonal therapy.  She took oral contraceptives for six years but stopped  over 14 years ago.  She has mild breast tenderness around the time of her  periods, but she does not have breast tenderness at this time.  She has a  definite poorly defined but palpable mass in the upper portion of her  breast.  She has great concern and does not wish to have it followed up.  She has asked that the mass be excised.   PAST MEDICAL HISTORY:  Long history of depression.   MEDICATIONS:  1. Zoloft 100 mg daily.  2. Trazodone 50 mg q.h.s.  3. Valtrex 1 g daily.   PAST SURGICAL HISTORY:  Tubal ligation in 1991.   FAMILY HISTORY:  Positive for hypertension, atherosclerotic heart disease.   REVIEW OF SYSTEMS:  Otherwise negative.   PHYSICAL EXAMINATION:  VITAL SIGNS:  Blood pressure 142/82, pulse 80,  respirations 20, weight 155 pounds.  HEENT:  Unremarkable.  NECK:  Supple without JVD or bruits.  CHEST:  Clear to auscultation.  HEART:  Regular rate and rhythm without murmur, gallop, or rub.  ABDOMEN:  Soft, nontender.  No masses.  BREASTS:  Unremarkable except for fullness at the 12o'clock position of the  left breast.  There is a slightly mobile, poorly defined mass in this area.  Axilla without  adenopathy.  EXTREMITIES:  No cyanosis, clubbing, or edema.  NEUROLOGIC:  No focal motor, sensory, or cerebellar deficits.    IMPRESSION:  1. Left breast mass.  2. Depression.   PLAN:  Left partial mastectomy.     ___________________________________________                                         Dirk Dress Katrinka Blazing, M.D.   LCS/MEDQ  D:  04/19/2003  T:  04/19/2003  Job:  [id number]

## 2010-11-21 NOTE — Procedures (Signed)
[name] [name], [name] [name]             ACCOUNT NO.:  [id number]   MEDICAL RECORD NO.:  [id number]          PATIENT TYPE:  OUT   LOCATION:  RAD                           FACILITY:  [facility]   PHYSICIAN:  Madaline Savage, M.D.DATE OF BIRTH:  [birth year]   DATE OF PROCEDURE:  04/09/2004  DATE OF DISCHARGE:                                  ECHOCARDIOGRAM   INDICATIONS FOR PROCEDURE:  New diagnosis of hypertension, palpitations.  The study is being done to assess left ventricular function.   STUDY:  Technically the study was good and adequate for interpretation.   RESULTS:  1.  Aortic valve:  The aortic valve appears to be trileaflet with excellent      leaflet excursion and no evidence of either stenosis or regurgitation.  2.  Mitral valve:  The mitral apparatus was structurally normal and there      was normal function.  3.  Tricuspid valve:  The tricuspid had delicate leaflets with normal      leaflet excursion and was normal.  4.  Pulmonic valve:  Not well seen.  5.  Aorta:  Normal aortic root dimension 2.3.  No sclerotic change noted in      the root.  6.  Left atrium:  Normal left atrial size of 4.  No evidence of mass or      thrombus.  7.  Right atrium:  Grossly normal.  8.  Right ventricle:  Normal.  9.  Left ventricle:  Normal LV size, normal LV thickness, normal LV systolic      function.  Ejection fraction estimate 55-65%.  LV end-systolic dimension      2.9.  LV end-diastolic dimension 4.1.  Septal and posterior wall dimensions 1.  1.  Pericardium:  No effusion noted.   FINAL DIAGNOSES:  1.  Normal echocardiogram.  2.  Normal left ventricular systolic function.  3.  No evidence of left ventricular hypertrophy.  4.  No pericardial effusion.  5.  No valvular pathology.       ___________________________________________  Madaline Savage, M.D.    WHG/MEDQ  D:  04/10/2004  T:  04/10/2004  Job:  [id number]   cc:   Donna Bernard, M.D.  [address]  [place]  [place] [address]  Fax: [phone]

## 2010-11-21 NOTE — Discharge Summary (Signed)
[name] [name], [name] [name]             ACCOUNT NO.:  [id number]   MEDICAL RECORD NO.:  [id number]          PATIENT TYPE:  INP   LOCATION:  A302                          FACILITY:  [facility]   PHYSICIAN:  Donna Bernard, M.D.DATE OF BIRTH:  12-31-1960   DATE OF ADMISSION:  10/12/2006  DATE OF DISCHARGE:  04/12/2008LH                               DISCHARGE SUMMARY   FINAL DIAGNOSES:  1. Abdominal pain secondary to degenerative fibroid with potential      infectious component.  2. Iron deficiency anemia.  3. Impaired fasting glucose.   FINAL DISPOSITION:  1. Patient discharged to home.  2. Patient to follow up with Dr. Despina Hidden in 2 weeks.  3. Biaxin 500 mg twice per day for 10 days.  4. Vicodin one every 4-6 hours as needed for pain.  5. Hold aspirin.  6. Maintain other usual medications.   HISTORY AND PHYSICAL:  Please see H&P as dictated.   HOSPITAL COURSE:  This patient is a 50 year old female who presented to  the office the day of admission with complaints of fairly severe low  abdominal pain.  She had been seen recently with probable PID.  The  patient returned stating she has had nausea, significant abdominal pain  and low grade fever.  We admitted her to the hospital.  She was started  on IV antibiotics.  CBC showed elevated white blood count at 15,000.  Due to pelvic component, GYN folks were consulted.  A scan was  performed.  Dr. Despina Hidden felt this was likely demonstrative of a  degenerative fibroid with potentially an infectious component.  He  recommended hysterectomy.  Patient is in the midst of starting at a very  crucial job and would like to hold off for now and Dr. Despina Hidden was in  agreement with the patient's plans.   Over several days of IV antibiotics, the patient gradually improved.  The day of discharge, she was feeling better and discharged home with  the diagnosis and disposition as noted above.  The patient was cautioned  at length that if she worsens or abdominal  pain persists or she develops  fever, vomiting or worsening discomfort, that she needs to get on with  definitive therapy of her problem.  Patient expresses understanding.  She was discharged home with diagnosis and disposition as noted above.     Donna Bernard, M.D.  Electronically Signed    WSL/MEDQ  D:  11/01/2006  T:  11/01/2006  Job:  [id number]

## 2010-11-21 NOTE — H&P (Signed)
[name] [name], [name] [name]             ACCOUNT NO.:  [id number]   MEDICAL RECORD NO.:  [id number]          PATIENT TYPE:  INP   LOCATION:  A302                          FACILITY:  [facility]   PHYSICIAN:  Donna Bernard, M.D.DATE OF BIRTH:  Dec 18, 1960   DATE OF ADMISSION:  10/12/2006  DATE OF DISCHARGE:  LH                              HISTORY & PHYSICAL   CHIEF COMPLAINT:  Abdominal pain.   SUBJECTIVE:  This patient is a 50 year old female with a prior  relatively benign medical history. She comes to our office episodically  and in recent years has mainly gone [facility] the [facility]. She is on  several medicines, and she has no idea of what they are, and she gets  them free at the free clinic, so there is no pharmacy to call at the  time of admission. The patient notes low abdominal pain.  This has been  going off and on for the past 4-5 days. Of note, she had some fatigue  and tiredness in the last couple weeks.  In fact, she presented to our  nurse practitioner last week with nonspecific fatigue and achiness. The  patient was having minimal discomfort in her pelvic area at that time.  Pelvic exam revealed an enlarged uterus. The patient was sent for an  ultrasound.  This showed a very significant grouping of fibroids.  Consultation with the GYN folks was initiated, but the patient did not  have a chance to go there by the time she represented to office. The  patient notes lower abdominal pain, worsening in the last several days.  This is associated with discharge at times.  The patient does have a  history of PID.  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  The patient has had nausea.  Her highest measured temperature is been  100.  No significant vomiting.  The patient claims compliance with her  medicines with what appears to be acyclovir for recurrent herpes,  unknown dose; none known blood pressure pill; an unknown medicine for  anxiety.   PAST SURGERIES:  Remote tubal  ligation.   FAMILY HISTORY:  Positive for hypertension, coronary artery disease,  hyperlipidemia   ALLERGIES:  None true but sensitive to DOXYCYCLINE.   SOCIAL HISTORY:  The patient is unemployed x6 months, has a daughter at  home who has four children and now has a fifth one on the way, and the  patient admits this being an obvious stress for her. No current tobacco  or alcohol use.   PHYSICAL EXAMINATION:  VITAL SIGNS:  Temperature 99.9, blood pressure  104/68.  GENERAL:  Alert, some distress.  HEENT: Normal.  NECK:  Supple.  LUNGS:  Clear.  HEART:  Regular rate and rhythm.  ABDOMEN:  Upper abdomen nontender.  Diffuse lower abdominal tenderness.  Positive guarding.  No true rebound.  Bowel sounds present.  PELVIC:  Very inflamed cervix, no true ulceration.  GC, chlamydia,  herpes tests all done. Positive adnexal tenderness.  Positive suprapubic  mass.  EXTREMITIES:  Thin, pulses good.  NEUROLOGIC:  No focal neurological deficit.  SIGNIFICANT LABORATORY DATA:  CBC 15,000 white blood count.  MET-7:  Glucose a couple of hours postprandial is 215.  Potassium level showed  3.2.   Recent ultrasound as noted.   IMPRESSION:  1. Progressive lower abdominal pain with white blood count and fever.      Question pelvic inflammatory disease equivalent versus degeneration      of a fibroid.  2. Hyperglycemia.  3. Unknown medications through the free clinic.  4. Significant stress.   PLAN:  1. Admit for IV antibiotics, specifically Rocephin and doxycycline.  2. GYN consultation.  3. CT scan of the abdomen and pelvis tomorrow.  4. Further orders as noted chart, frequent Accu-Cheks.      Donna Bernard, M.D.  Electronically Signed     WSL/MEDQ  D:  10/13/2006  T:  10/13/2006  Job:  [id number]

## 2010-12-24 ENCOUNTER — Other Ambulatory Visit: Payer: Self-pay | Admitting: Family Medicine

## 2010-12-24 DIAGNOSIS — Z139 Encounter for screening, unspecified: Secondary | ICD-10-CM

## 2010-12-29 ENCOUNTER — Ambulatory Visit (HOSPITAL_COMMUNITY)
Admission: RE | Admit: 2010-12-29 | Discharge: 2010-12-29 | Disposition: A | Discharge status: Home / Self Care | Payer: BC Managed Care – PPO | Source: Ambulatory Visit | Attending: Family Medicine | Admitting: Family Medicine

## 2010-12-29 DIAGNOSIS — Z1231 Encounter for screening mammogram for malignant neoplasm of breast: Secondary | ICD-10-CM | POA: Insufficient documentation

## 2010-12-29 DIAGNOSIS — Z139 Encounter for screening, unspecified: Secondary | ICD-10-CM

## 2011-04-06 LAB — COMPREHENSIVE METABOLIC PANEL
ALT: 28
AST: 21
Albumin: 4.1
Alkaline Phosphatase: 62
BUN: 9
CO2: 30
Calcium: 9.3
Chloride: 105
Creatinine, Ser: 0.96
GFR calc Af Amer: >60
GFR calc non Af Amer: >60
Glucose, Bld: 87
Potassium: 4.1
Sodium: 138
Total Bilirubin: 0.5
Total Protein: 7

## 2011-04-06 LAB — CBC
HCT: 39.2
Hemoglobin: 12.9
MCHC: 33.1
MCV: 85.7
Platelets: 219
RBC: 4.57
RDW: 14.2
WBC: 11.2 — ABNORMAL HIGH

## 2011-04-06 LAB — DIFFERENTIAL
Basophils Absolute: 0
Basophils Relative: 0
Eosinophils Absolute: 0.2
Eosinophils Relative: 2
Lymphocytes Relative: 23
Lymphs Abs: 2.6
Monocytes Absolute: 0.7
Monocytes Relative: 6
Neutro Abs: 7.7
Neutrophils Relative %: 69

## 2011-04-06 LAB — URINALYSIS, ROUTINE W REFLEX MICROSCOPIC
Bilirubin Urine: NEGATIVE
Glucose, UA: NEGATIVE
Hgb urine dipstick: NEGATIVE
Ketones, ur: NEGATIVE
Nitrite: NEGATIVE
Protein, ur: NEGATIVE
Specific Gravity, Urine: 1.015
Urobilinogen, UA: 0.2
pH: 6

## 2011-04-06 LAB — PREGNANCY, URINE: Preg Test, Ur: NEGATIVE

## 2011-04-06 LAB — LIPASE, BLOOD: Lipase: 21

## 2011-04-10 LAB — DIFFERENTIAL
Basophils Absolute: 0 10*3/uL (ref 0.0–0.1)
Basophils Absolute: 0 10*3/uL (ref 0.0–0.1)
Basophils Relative: 0 % (ref 0–1)
Basophils Relative: 0 % (ref 0–1)
Eosinophils Absolute: 0.1 10*3/uL (ref 0.0–0.7)
Eosinophils Absolute: 0.2 10*3/uL (ref 0.0–0.7)
Eosinophils Relative: 1 % (ref 0–5)
Eosinophils Relative: 1 % (ref 0–5)
Lymphocytes Relative: 23 % (ref 12–46)
Lymphocytes Relative: 24 % (ref 12–46)
Lymphs Abs: 3 10*3/uL (ref 0.7–4.0)
Lymphs Abs: 3.1 10*3/uL (ref 0.7–4.0)
Monocytes Absolute: 0.7 10*3/uL (ref 0.1–1.0)
Monocytes Absolute: 0.9 10*3/uL (ref 0.1–1.0)
Monocytes Relative: 6 % (ref 3–12)
Monocytes Relative: 6 % (ref 3–12)
Neutro Abs: 8.5 10*3/uL — ABNORMAL HIGH (ref 1.7–7.7)
Neutro Abs: 9.2 10*3/uL — ABNORMAL HIGH (ref 1.7–7.7)
Neutrophils Relative %: 68 % (ref 43–77)
Neutrophils Relative %: 69 % (ref 43–77)

## 2011-04-10 LAB — CARDIAC PANEL(CRET KIN+CKTOT+MB+TROPI)
CK, MB: 1.2 ng/mL (ref 0.3–4.0)
CK, MB: 1.7 ng/mL (ref 0.3–4.0)
CK, MB: 4.5 ng/mL — ABNORMAL HIGH (ref 0.3–4.0)
Relative Index: 1 (ref 0.0–2.5)
Relative Index: 1.3 (ref 0.0–2.5)
Relative Index: 3.9 — ABNORMAL HIGH (ref 0.0–2.5)
Total CK: 114 U/L (ref 7–177)
Total CK: 126 U/L (ref 7–177)
Total CK: 127 U/L (ref 7–177)
Troponin I: 0.01 ng/mL (ref 0.00–0.06)
Troponin I: 0.02 ng/mL (ref 0.00–0.06)
Troponin I: 0.05 ng/mL (ref 0.00–0.06)

## 2011-04-10 LAB — BASIC METABOLIC PANEL
BUN: 6 mg/dL (ref 6–23)
BUN: 9 mg/dL (ref 6–23)
CO2: 26 mEq/L (ref 19–32)
CO2: 28 mEq/L (ref 19–32)
Calcium: 8.7 mg/dL (ref 8.4–10.5)
Calcium: 9.4 mg/dL (ref 8.4–10.5)
Chloride: 104 mEq/L (ref 96–112)
Chloride: 104 mEq/L (ref 96–112)
Creatinine, Ser: 0.77 mg/dL (ref 0.4–1.2)
Creatinine, Ser: 0.79 mg/dL (ref 0.4–1.2)
GFR calc Af Amer: >60 mL/min (ref >60)
GFR calc Af Amer: >60 mL/min (ref >60)
GFR calc non Af Amer: >60 mL/min (ref >60)
GFR calc non Af Amer: >60 mL/min (ref >60)
Glucose, Bld: 108 mg/dL — ABNORMAL HIGH (ref 70–99)
Glucose, Bld: 112 mg/dL — ABNORMAL HIGH (ref 70–99)
Potassium: 3.8 mEq/L (ref 3.5–5.1)
Potassium: 3.8 mEq/L (ref 3.5–5.1)
Sodium: 137 mEq/L (ref 135–145)
Sodium: 139 mEq/L (ref 135–145)

## 2011-04-10 LAB — CBC
HCT: 35.7 % — ABNORMAL LOW (ref 36.0–46.0)
HCT: 38.1 % (ref 36.0–46.0)
Hemoglobin: 11.7 g/dL — ABNORMAL LOW (ref 12.0–15.0)
Hemoglobin: 12.8 g/dL (ref 12.0–15.0)
MCHC: 32.8 g/dL (ref 30.0–36.0)
MCHC: 33.5 g/dL (ref 30.0–36.0)
MCV: 84.3 fL (ref 78.0–100.0)
MCV: 84.4 fL (ref 78.0–100.0)
Platelets: 200 10*3/uL (ref 150–400)
Platelets: 234 10*3/uL (ref 150–400)
RBC: 4.23 MIL/uL (ref 3.87–5.11)
RBC: 4.52 MIL/uL (ref 3.87–5.11)
RDW: 14.4 % (ref 11.5–15.5)
RDW: 14.5 % (ref 11.5–15.5)
WBC: 12.4 10*3/uL — ABNORMAL HIGH (ref 4.0–10.5)
WBC: 13.4 10*3/uL — ABNORMAL HIGH (ref 4.0–10.5)

## 2011-04-10 LAB — PHOSPHORUS: Phosphorus: 4.7 mg/dL — ABNORMAL HIGH (ref 2.3–4.6)

## 2011-04-10 LAB — PROTIME-INR
INR: 1 (ref 0.00–1.49)
Prothrombin Time: 13.1 seconds (ref 11.6–15.2)

## 2011-04-10 LAB — POCT CARDIAC MARKERS
CKMB, poc: <1 ng/mL — ABNORMAL LOW (ref 1.0–8.0)
CKMB, poc: <1 ng/mL — ABNORMAL LOW (ref 1.0–8.0)
Myoglobin, poc: 103 ng/mL (ref 12–200)
Myoglobin, poc: 80 ng/mL (ref 12–200)
Troponin i, poc: <0.05 ng/mL (ref 0.00–0.09)
Troponin i, poc: <0.05 ng/mL (ref 0.00–0.09)

## 2011-04-10 LAB — B-NATRIURETIC PEPTIDE (CONVERTED LAB): Pro B Natriuretic peptide (BNP): <30 pg/mL (ref 0.0–100.0)

## 2011-04-10 LAB — APTT: aPTT: 33 seconds (ref 24–37)

## 2011-04-10 LAB — D-DIMER, QUANTITATIVE (NOT AT ARMC): D-Dimer, Quant: 2.3 ug/mL-FEU — ABNORMAL HIGH (ref 0.00–0.48)

## 2011-04-10 LAB — TSH: TSH: 4.643 u[IU]/mL — ABNORMAL HIGH (ref 0.350–4.500)

## 2011-04-10 LAB — MAGNESIUM: Magnesium: 1.9 mg/dL (ref 1.5–2.5)

## 2011-04-16 LAB — COMPREHENSIVE METABOLIC PANEL
ALT: 17
AST: 16
Albumin: 3.8
Alkaline Phosphatase: 48
BUN: 8
CO2: 30
Calcium: 8.9
Chloride: 106
Creatinine, Ser: 0.84
GFR calc Af Amer: >60
GFR calc non Af Amer: >60
Glucose, Bld: 101 — ABNORMAL HIGH
Potassium: 3.8
Sodium: 140
Total Bilirubin: 0.6
Total Protein: 6.6

## 2011-04-16 LAB — URINALYSIS, ROUTINE W REFLEX MICROSCOPIC
Bilirubin Urine: NEGATIVE
Glucose, UA: NEGATIVE
Hgb urine dipstick: NEGATIVE
Ketones, ur: NEGATIVE
Nitrite: NEGATIVE
Protein, ur: NEGATIVE
Specific Gravity, Urine: 1.015
Urobilinogen, UA: 1
pH: 8

## 2011-04-16 LAB — CBC
HCT: 26.7 — ABNORMAL LOW
HCT: 27.7 — ABNORMAL LOW
HCT: 35.2 — ABNORMAL LOW
Hemoglobin: 11.6 — ABNORMAL LOW
Hemoglobin: 8.9 — ABNORMAL LOW
Hemoglobin: 9.3 — ABNORMAL LOW
MCHC: 33
MCHC: 33.4
MCHC: 33.5
MCV: 83.1
MCV: 83.2
MCV: 83.3
Platelets: 171
Platelets: 197
Platelets: 228
RBC: 3.21 — ABNORMAL LOW
RBC: 3.33 — ABNORMAL LOW
RBC: 4.23
RDW: 15.5 — ABNORMAL HIGH
RDW: 15.6 — ABNORMAL HIGH
RDW: 15.7 — ABNORMAL HIGH
WBC: 11 — ABNORMAL HIGH
WBC: 11.5 — ABNORMAL HIGH
WBC: 16.6 — ABNORMAL HIGH

## 2011-04-16 LAB — DIFFERENTIAL
Basophils Absolute: 0
Basophils Absolute: 0
Basophils Absolute: 0
Basophils Relative: 0
Basophils Relative: 0
Basophils Relative: 0
Eosinophils Absolute: 0
Eosinophils Absolute: 0.1
Eosinophils Absolute: 0.1
Eosinophils Relative: 0
Eosinophils Relative: 1
Eosinophils Relative: 1
Lymphocytes Relative: 18
Lymphocytes Relative: 26
Lymphocytes Relative: 9 — ABNORMAL LOW
Lymphs Abs: 1.5
Lymphs Abs: 2
Lymphs Abs: 2.9
Monocytes Absolute: 0.1 — ABNORMAL LOW
Monocytes Absolute: 0.6
Monocytes Absolute: 0.6
Monocytes Relative: 1 — ABNORMAL LOW
Monocytes Relative: 6
Monocytes Relative: 6
Neutro Abs: 14.9 — ABNORMAL HIGH
Neutro Abs: 7.8 — ABNORMAL HIGH
Neutro Abs: 8.2 — ABNORMAL HIGH
Neutrophils Relative %: 68
Neutrophils Relative %: 75
Neutrophils Relative %: 90 — ABNORMAL HIGH

## 2011-04-16 LAB — URINE MICROSCOPIC-ADD ON

## 2011-04-16 LAB — CROSSMATCH
ABO/RH(D): O POS
Antibody Screen: NEGATIVE

## 2011-04-16 LAB — HCG, QUANTITATIVE, PREGNANCY: hCG, Beta Chain, Quant, S: 2

## 2011-04-16 LAB — ABO/RH: ABO/RH(D): O POS

## 2012-01-21 ENCOUNTER — Other Ambulatory Visit: Payer: Self-pay | Admitting: Family Medicine

## 2012-01-21 ENCOUNTER — Ambulatory Visit (HOSPITAL_COMMUNITY)
Admission: RE | Admit: 2012-01-21 | Discharge: 2012-01-21 | Disposition: A | Discharge status: Home / Self Care | Payer: BC Managed Care – PPO | Source: Ambulatory Visit | Attending: Family Medicine | Admitting: Family Medicine

## 2012-01-21 DIAGNOSIS — R0602 Shortness of breath: Secondary | ICD-10-CM

## 2012-01-21 DIAGNOSIS — M79609 Pain in unspecified limb: Secondary | ICD-10-CM | POA: Insufficient documentation

## 2012-01-21 DIAGNOSIS — R079 Chest pain, unspecified: Secondary | ICD-10-CM | POA: Insufficient documentation

## 2012-06-27 ENCOUNTER — Ambulatory Visit (INDEPENDENT_AMBULATORY_CARE_PROVIDER_SITE_OTHER): Payer: BC Managed Care – PPO | Admitting: Cardiology

## 2012-06-27 ENCOUNTER — Encounter: Payer: Self-pay | Admitting: Cardiology

## 2012-06-27 ENCOUNTER — Encounter: Payer: Self-pay | Admitting: *Deleted

## 2012-06-27 VITALS — BP 92/60 | HR 64 | Ht 61.0 in | Wt 173.0 lb

## 2012-06-27 DIAGNOSIS — M1710 Unilateral primary osteoarthritis, unspecified knee: Secondary | ICD-10-CM

## 2012-06-27 DIAGNOSIS — R002 Palpitations: Secondary | ICD-10-CM

## 2012-06-27 DIAGNOSIS — Z0189 Encounter for other specified special examinations: Secondary | ICD-10-CM

## 2012-06-27 DIAGNOSIS — IMO0002 Reserved for concepts with insufficient information to code with codable children: Secondary | ICD-10-CM

## 2012-06-27 DIAGNOSIS — M479 Spondylosis, unspecified: Secondary | ICD-10-CM

## 2012-06-27 DIAGNOSIS — R079 Chest pain, unspecified: Secondary | ICD-10-CM

## 2012-06-27 NOTE — Progress Notes (Deleted)
Name: [name] [name]    DOB: 1961/03/21  Age: 51 y.o.  MR#: [id number]       PCP:  Harlow Asa, MD      Insurance: @PAYORNAME @   CC:   No chief complaint on file.  MEDICATION BOTTLES  LAST SEEN BY [facility] IN HOSPITAL 2009 FOR CP - NEVER HAD CHEST PAIN SINCE THAT EPISODE C/O PALPITATIONS - LIMITED CAFFEINE USE   VS BP 92/60  Pulse 64  Ht 5\' 1"  (1.549 m)  Wt 173 lb (78.472 kg)  BMI 32.69 kg/m2  SpO2 98%  Weights Current Weight  06/27/12 173 lb (78.472 kg)  10/03/09 174 lb (78.926 kg)  08/18/07 162 lb (73.483 kg)    Blood Pressure  BP Readings from Last 3 Encounters:  06/27/12 92/60     Admit date:  (Not on file) Last encounter with RMR:  Visit date not found   Allergy Allergies  Allergen Reactions  . Doxycycline Other (See Comments)    Unknown    Current Outpatient Prescriptions  Medication Sig Dispense Refill  . atenolol (TENORMIN) 50 MG tablet Take 50 mg by mouth daily.       . citalopram (CELEXA) 40 MG tablet Take 20 mg by mouth daily.       . indomethacin (INDOCIN) 25 MG capsule Take 25 mg by mouth as needed.      . pantoprazole (PROTONIX) 40 MG tablet Take 40 mg by mouth daily.      . valACYclovir (VALTREX) 1000 MG tablet         Discontinued Meds:   There are no discontinued medications.  Patient Active Problem List  Diagnosis  . DEGENERATIVE JOINT DISEASE, RIGHT KNEE  . ARTHRITIS, BACK  . DISC DEGENERATION  . SCIATICA    LABS No visits with results within 3 Month(s) from this visit. Latest known visit with results is:  Admission on 06/06/2008, Discharged on 06/06/2008  Component Date Value  . Sodium 06/05/2008 137   . Potassium 06/05/2008 3.8   . Chloride 06/05/2008 104   . CO2 06/05/2008 28   . Glucose, Bld 06/05/2008 108*  . BUN 06/05/2008 9   . Creatinine, Ser 06/05/2008 0.79   . Calcium 06/05/2008 9.4   . GFR calc non Af Amer 06/05/2008 >60   . GFR calc Af Amer 06/05/2008                     Value:>60                                The  eGFR has been calculated                         using the MDRD equation.                         This calculation has not been                         validated in all clinical  . WBC 06/05/2008 13.4*  . RBC 06/05/2008 4.52   . Hemoglobin 06/05/2008 12.8   . HCT 06/05/2008 38.1   . MCV 06/05/2008 84.3   . MCHC 06/05/2008 33.5   . RDW 06/05/2008 14.5   . Platelets 06/05/2008 234   . Neutrophils Relative 06/05/2008 69   . Neutro Abs  06/05/2008 9.2*  . Lymphocytes Relative 06/05/2008 23   . Lymphs Abs 06/05/2008 3.1   . Monocytes Relative 06/05/2008 6   . Monocytes Absolute 06/05/2008 0.9   . Eosinophils Relative 06/05/2008 1   . Eosinophils Absolute 06/05/2008 0.2   . Basophils Relative 06/05/2008 0   . Basophils Absolute 06/05/2008 0.0   . D-Dimer, Quant 06/05/2008 *                   Value:2.30                                AT THE INHOUSE ESTABLISHED CUTOFF                         VALUE OF 0.48 ug/mL FEU,                         THIS ASSAY HAS BEEN DOCUMENTED                         IN THE LITERATURE TO HAVE  . Myoglobin, poc 06/06/2008 80.0   . CKMB, poc 06/06/2008 <1.0*  . Troponin i, poc 06/06/2008 <0.05   . Comment 06/06/2008                     Value:                               TROPONIN VALUES IN THE RANGE                         OF 0.00-0.09 ng/mL SHOW                         NO INDICATION OF                         MYOCARDIAL INJURY.  . Myoglobin, poc 06/06/2008 103   . CKMB, poc 06/06/2008 <1.0*  . Troponin i, poc 06/06/2008 <0.05   . Comment 06/06/2008                     Value:                               TROPONIN VALUES IN THE RANGE                         OF 0.00-0.09 ng/mL SHOW                         NO INDICATION OF                         MYOCARDIAL INJURY.  . Sodium 06/06/2008 139   . Potassium 06/06/2008 3.8   . Chloride 06/06/2008 104   . CO2 06/06/2008 26   . Glucose, Bld 06/06/2008 112*  . BUN 06/06/2008 6   . Creatinine, Ser 06/06/2008  0.77   . Calcium 06/06/2008 8.7   . GFR calc non Af Amer 06/06/2008 >60   . GFR calc Af Denyse Dago 06/06/2008  Value:>60                                The eGFR has been calculated                         using the MDRD equation.                         This calculation has not been                         validated in all clinical  . WBC 06/06/2008 12.4*  . RBC 06/06/2008 4.23   . Hemoglobin 06/06/2008 11.7*  . HCT 06/06/2008 35.7*  . MCV 06/06/2008 84.4   . MCHC 06/06/2008 32.8   . RDW 06/06/2008 14.4   . Platelets 06/06/2008 200   . Neutrophils Relative 06/06/2008 68   . Neutro Abs 06/06/2008 8.5*  . Lymphocytes Relative 06/06/2008 24   . Lymphs Abs 06/06/2008 3.0   . Monocytes Relative 06/06/2008 6   . Monocytes Absolute 06/06/2008 0.7   . Eosinophils Relative 06/06/2008 1   . Eosinophils Absolute 06/06/2008 0.1   . Basophils Relative 06/06/2008 0   . Basophils Absolute 06/06/2008 0.0   . Magnesium 06/06/2008 1.9   . Phosphorus 06/06/2008 4.7*  . Prothrombin Time 06/06/2008 13.1   . INR 06/06/2008 1.0   . aPTT 06/06/2008 33   . TSH 06/06/2008 4.643 ***Test methodology is 3rd generation TSH****  . Pro B Natriuretic peptid* 06/06/2008 <30.0   . Total CK 06/06/2008 114   . CK, MB 06/06/2008 4.5*  . Troponin I 06/06/2008                     Value:0.05                                NO INDICATION OF                         MYOCARDIAL INJURY.  . Relative Index 06/06/2008 3.9*  . Total CK 06/06/2008 127   . CK, MB 06/06/2008 1.7   . Troponin I 06/06/2008                     Value:0.02                                NO INDICATION OF                         MYOCARDIAL INJURY.  . Relative Index 06/06/2008 1.3   . Total CK 06/06/2008 126   . CK, MB 06/06/2008 1.2   . Troponin I 06/06/2008                     Value:0.01                                NO INDICATION OF                         MYOCARDIAL INJURY.  Marland Kitchen  Relative Index 06/06/2008 1.0      Results  for this Opt Visit:     Results for orders placed during the hospital encounter of 06/06/08  BASIC METABOLIC PANEL      Component Value Range   Sodium 137  135 - 145 mEq/L   Potassium 3.8  3.5 - 5.1 mEq/L   Chloride 104  96 - 112 mEq/L   CO2 28  19 - 32 mEq/L   Glucose, Bld 108 (*) 70 - 99 mg/dL   BUN 9  6 - 23 mg/dL   Creatinine, Ser 8.29  0.4 - 1.2 mg/dL   Calcium 9.4  8.4 - 56.2 mg/dL   GFR calc non Af Amer >60  >60 mL/min   GFR calc Af Amer    >60 mL/min   Value: >60            The eGFR has been calculated     using the MDRD equation.     This calculation has not been     validated in all clinical  CBC      Component Value Range   WBC 13.4 (*) 4.0 - 10.5 K/uL   RBC 4.52  3.87 - 5.11 MIL/uL   Hemoglobin 12.8  12.0 - 15.0 g/dL   HCT 13.0  86.5 - 78.4 %   MCV 84.3  78.0 - 100.0 fL   MCHC 33.5  30.0 - 36.0 g/dL   RDW 69.6  29.5 - 28.4 %   Platelets 234  150 - 400 K/uL  DIFFERENTIAL      Component Value Range   Neutrophils Relative 69  43 - 77 %   Neutro Abs 9.2 (*) 1.7 - 7.7 K/uL   Lymphocytes Relative 23  12 - 46 %   Lymphs Abs 3.1  0.7 - 4.0 K/uL   Monocytes Relative 6  3 - 12 %   Monocytes Absolute 0.9  0.1 - 1.0 K/uL   Eosinophils Relative 1  0 - 5 %   Eosinophils Absolute 0.2  0.0 - 0.7 K/uL   Basophils Relative 0  0 - 1 %   Basophils Absolute 0.0  0.0 - 0.1 K/uL  D-DIMER, QUANTITATIVE      Component Value Range   D-Dimer, Quant   (*) 0.00 - 0.48 ug/mL-FEU   Value: 2.30            AT THE INHOUSE ESTABLISHED CUTOFF     VALUE OF 0.48 ug/mL FEU,     THIS ASSAY HAS BEEN DOCUMENTED     IN THE LITERATURE TO HAVE  POCT CARDIAC MARKERS      Component Value Range   Myoglobin, poc 80.0  12 - 200 ng/mL   CKMB, poc <1.0 (*) 1.0 - 8.0 ng/mL   Troponin i, poc <0.05  0.00 - 0.09 ng/mL   Comment       Value:            TROPONIN VALUES IN THE RANGE     OF 0.00-0.09 ng/mL SHOW     NO INDICATION OF     MYOCARDIAL INJURY.  POCT CARDIAC MARKERS      Component Value Range    Myoglobin, poc 103  12 - 200 ng/mL   CKMB, poc <1.0 (*) 1.0 - 8.0 ng/mL   Troponin i, poc <0.05  0.00 - 0.09 ng/mL   Comment       Value:            TROPONIN VALUES  IN THE RANGE     OF 0.00-0.09 ng/mL SHOW     NO INDICATION OF     MYOCARDIAL INJURY.  BASIC METABOLIC PANEL      Component Value Range   Sodium 139  135 - 145 mEq/L   Potassium 3.8  3.5 - 5.1 mEq/L   Chloride 104  96 - 112 mEq/L   CO2 26  19 - 32 mEq/L   Glucose, Bld 112 (*) 70 - 99 mg/dL   BUN 6  6 - 23 mg/dL   Creatinine, Ser 5.62  0.4 - 1.2 mg/dL   Calcium 8.7  8.4 - 13.0 mg/dL   GFR calc non Af Amer >60  >60 mL/min   GFR calc Af Amer    >60 mL/min   Value: >60            The eGFR has been calculated     using the MDRD equation.     This calculation has not been     validated in all clinical  CBC      Component Value Range   WBC 12.4 (*) 4.0 - 10.5 K/uL   RBC 4.23  3.87 - 5.11 MIL/uL   Hemoglobin 11.7 (*) 12.0 - 15.0 g/dL   HCT 86.5 (*) 78.4 - 69.6 %   MCV 84.4  78.0 - 100.0 fL   MCHC 32.8  30.0 - 36.0 g/dL   RDW 29.5  28.4 - 13.2 %   Platelets 200  150 - 400 K/uL  DIFFERENTIAL      Component Value Range   Neutrophils Relative 68  43 - 77 %   Neutro Abs 8.5 (*) 1.7 - 7.7 K/uL   Lymphocytes Relative 24  12 - 46 %   Lymphs Abs 3.0  0.7 - 4.0 K/uL   Monocytes Relative 6  3 - 12 %   Monocytes Absolute 0.7  0.1 - 1.0 K/uL   Eosinophils Relative 1  0 - 5 %   Eosinophils Absolute 0.1  0.0 - 0.7 K/uL   Basophils Relative 0  0 - 1 %   Basophils Absolute 0.0  0.0 - 0.1 K/uL  MAGNESIUM      Component Value Range   Magnesium 1.9  1.5 - 2.5 mg/dL  PHOSPHORUS      Component Value Range   Phosphorus 4.7 (*) 2.3 - 4.6 mg/dL  PROTIME-INR      Component Value Range   Prothrombin Time 13.1  11.6 - 15.2 seconds   INR 1.0  0.00 - 1.49  APTT      Component Value Range   aPTT 33  24 - 37 seconds  TSH      Component Value Range   TSH 4.643 ***Test methodology is 3rd generation TSH*** (*) 0.350 - 4.500 uIU/mL   B-NATRIURETIC PEPTIDE (CONVERTED LAB)      Component Value Range   Pro B Natriuretic peptide (BNP) <30.0  0.0 - 100.0 pg/mL  CARDIAC PANEL(CRET KIN+CKTOT+MB+TROPI)      Component Value Range   Total CK 114  7 - 177 U/L   CK, MB 4.5 (*) 0.3 - 4.0 ng/mL   Troponin I    0.00 - 0.06 ng/mL   Value: 0.05            NO INDICATION OF     MYOCARDIAL INJURY.   Relative Index 3.9 (*) 0.0 - 2.5  CARDIAC PANEL(CRET KIN+CKTOT+MB+TROPI)      Component Value Range   Total CK 127  7 - 177 U/L   CK, MB 1.7  0.3 - 4.0 ng/mL   Troponin I    0.00 - 0.06 ng/mL   Value: 0.02            NO INDICATION OF     MYOCARDIAL INJURY.   Relative Index 1.3  0.0 - 2.5  CARDIAC PANEL(CRET KIN+CKTOT+MB+TROPI)      Component Value Range   Total CK 126  7 - 177 U/L   CK, MB 1.2  0.3 - 4.0 ng/mL   Troponin I    0.00 - 0.06 ng/mL   Value: 0.01            NO INDICATION OF     MYOCARDIAL INJURY.   Relative Index 1.0  0.0 - 2.5    EKG No orders found for this or any previous visit.   Prior Assessment and Plan Problem List as of 06/27/2012          DEGENERATIVE JOINT DISEASE, RIGHT KNEE   ARTHRITIS, BACK   DISC DEGENERATION   SCIATICA       Imaging: No results found.   FRS Calculation: Score not calculated. Missing: Total Cholesterol, HDL

## 2012-06-27 NOTE — Patient Instructions (Addendum)
Your physician recommends that you schedule a follow-up appointment in: 1 month  Your physician has recommended that you wear an event monitor. Event monitors are medical devices that record the heart's electrical activity. Doctors most often [place] these monitors to diagnose arrhythmias. Arrhythmias are problems with the speed or rhythm of the heartbeat. The monitor is a small, portable device. You can wear one while you do your normal daily activities. This is usually used to diagnose what is causing palpitations/syncope (passing out).

## 2012-06-28 ENCOUNTER — Encounter: Payer: Self-pay | Admitting: Cardiology

## 2012-06-28 DIAGNOSIS — R079 Chest pain, unspecified: Secondary | ICD-10-CM | POA: Insufficient documentation

## 2012-06-28 DIAGNOSIS — Z0189 Encounter for other specified special examinations: Secondary | ICD-10-CM | POA: Insufficient documentation

## 2012-06-28 DIAGNOSIS — R002 Palpitations: Secondary | ICD-10-CM | POA: Insufficient documentation

## 2012-06-28 NOTE — Assessment & Plan Note (Signed)
The patient has had a good response to treatment with beta blocker, but continues to note intermittent mild symptoms, which likely reflect PACs and/or PVCs.  The possibility of a somewhat more worrisome arrhythmia such as PSVT or paroxysmal atrial fibrillation is certainly a consideration.  Since episodes do not occur every day or every other day, Holter monitoring will not likely be of benefit.  We will proceed with event recording and reassess this nice woman in one month.  She is relatively bradycardic at rest, which may preclude a further increase in her dose of atenolol.  A combined beta blocker/partial agonist such as acebutolol might be a reasonable alternative for her.

## 2012-06-28 NOTE — Progress Notes (Signed)
Patient ID: [name] [name], female   DOB: Apr 07, 1961, 51 y.o.   MRN: [id number]  HPI: Cardiology consultation kindly requested by Dr. Gerda Diss for evaluation of palpitations prior to planned treatment with appetite suppressant medication.  I previously had the opportunity to evaluate and this nice woman for chest discomfort and palpitations when she was admitted to hospital 4 years ago  Testing revealed no significant cardiac abnormalities.  Treatment with beta blocker has substantially suppressed her palpitations, but she continues to experience brief episodes every few days.  She does not find these particularly troublesome or worrisome.  She has regained approximately 40 pounds of a previous substantial weight loss and wishes to attempt to once again achieve an ideal weight.  Current Outpatient Prescriptions on File Prior to Visit  Medication Sig Dispense Refill  . atenolol (TENORMIN) 50 MG tablet Take 50 mg by mouth daily.       . citalopram (CELEXA) 40 MG tablet Take 20 mg by mouth daily.       . pantoprazole (PROTONIX) 40 MG tablet Take 40 mg by mouth daily.       Allergies  Allergen Reactions  . Doxycycline Other (See Comments)    Unknown     Past Medical History  Diagnosis Date  . Chest pain 2009    Consultation-[facility], negative chest CT; nl echo in 2005; h/o palpitations  . Palpitations   . Degenerative joint disease     Past Surgical History  Procedure Date  . Colonoscopy 10/2010    proctitis; melanosis coli  . Tubal ligation   . Total abdominal hysterectomy 03/2007    fibroids; iron deficiency anemia  . Breast excisional biopsy 04/2003    Left; benign disease    Family History  Problem Relation Age of Onset  . Aneurysm Mother     Cerebral  . Coronary artery disease Father   . Lung cancer Brother     History   Social History  . Marital Status: Married    Spouse Name: N/A    Number of Children: 4  . Years of Education: N/A   Occupational History  . Factory work  [employer]   Social History Main Topics  . Smoking status: Former Smoker -- 0.5 packs/day for 15 years    Types: Cigarettes    Quit date: 07/06/2001  . Smokeless tobacco: Never Used  . Alcohol Use: No  . Drug Use: No  . Sexually Active: Not on file   Other Topics Concern  . Not on file   Social History Narrative  . No narrative on file   ROS: Denies chest discomfort, orthopnea, PND, pedal edema, lightheadedness or syncope.  She notes mild dyspnea with moderate to marked exertion.  Colonoscopy in 2012 revealed proctitis and melanosis coli.  She has arthritic discomfort of her shoulders and back and reports intermittent anxiety.  All other systems reviewed and are negative.  PHYSICAL EXAM: BP 92/60  Pulse 64  Ht 5\' 1"  (1.549 m)  Wt 78.472 kg (173 lb)  BMI 32.69 kg/m2  SpO2 98%  General-Well-developed; no acute distress Body Habitus-moderately overweight HEENT-[place]/AT; PERRL; EOM intact; conjunctiva and lids nl Neck-No JVD; no carotid bruits Endocrine-No thyromegaly Lungs-Clear lung fields; resonant percussion; normal I-to-E ratio Cardiovascular- normal PMI; normal S1 and S2 Abdomen-BS normal; soft and non-tender without masses or organomegaly Musculoskeletal-No deformities, cyanosis or clubbing Neurologic-Nl cranial nerves; symmetric strength and tone Skin- Warm, no significant lesions Extremities-Nl distal pulses; no edema  EKG:  Tracing performed in  09/2010 obtained and reviewed.  Normal sinus rhythm; borderline low voltage; otherwise normal  ASSESSMENT AND PLAN:  Trenton Bing, MD 06/28/2012 10:11 AM

## 2012-07-05 ENCOUNTER — Emergency Department (HOSPITAL_COMMUNITY): Payer: BC Managed Care – PPO

## 2012-07-05 ENCOUNTER — Emergency Department (HOSPITAL_COMMUNITY)
Admission: EM | Admit: 2012-07-05 | Discharge: 2012-07-05 | Disposition: A | Discharge status: Home / Self Care | Payer: BC Managed Care – PPO | Attending: Emergency Medicine | Admitting: Emergency Medicine

## 2012-07-05 ENCOUNTER — Encounter (HOSPITAL_COMMUNITY): Payer: Self-pay | Admitting: Emergency Medicine

## 2012-07-05 DIAGNOSIS — R630 Anorexia: Secondary | ICD-10-CM | POA: Insufficient documentation

## 2012-07-05 DIAGNOSIS — R002 Palpitations: Secondary | ICD-10-CM | POA: Insufficient documentation

## 2012-07-05 DIAGNOSIS — IMO0001 Reserved for inherently not codable concepts without codable children: Secondary | ICD-10-CM | POA: Insufficient documentation

## 2012-07-05 DIAGNOSIS — F3289 Other specified depressive episodes: Secondary | ICD-10-CM | POA: Insufficient documentation

## 2012-07-05 DIAGNOSIS — J029 Acute pharyngitis, unspecified: Secondary | ICD-10-CM | POA: Insufficient documentation

## 2012-07-05 DIAGNOSIS — R51 Headache: Secondary | ICD-10-CM | POA: Insufficient documentation

## 2012-07-05 DIAGNOSIS — R059 Cough, unspecified: Secondary | ICD-10-CM | POA: Insufficient documentation

## 2012-07-05 DIAGNOSIS — F329 Major depressive disorder, single episode, unspecified: Secondary | ICD-10-CM | POA: Insufficient documentation

## 2012-07-05 DIAGNOSIS — Z8679 Personal history of other diseases of the circulatory system: Secondary | ICD-10-CM | POA: Insufficient documentation

## 2012-07-05 DIAGNOSIS — H6120 Impacted cerumen, unspecified ear: Secondary | ICD-10-CM | POA: Insufficient documentation

## 2012-07-05 DIAGNOSIS — Z87891 Personal history of nicotine dependence: Secondary | ICD-10-CM | POA: Insufficient documentation

## 2012-07-05 DIAGNOSIS — H9209 Otalgia, unspecified ear: Secondary | ICD-10-CM | POA: Insufficient documentation

## 2012-07-05 DIAGNOSIS — Z8619 Personal history of other infectious and parasitic diseases: Secondary | ICD-10-CM | POA: Insufficient documentation

## 2012-07-05 DIAGNOSIS — Z79899 Other long term (current) drug therapy: Secondary | ICD-10-CM | POA: Insufficient documentation

## 2012-07-05 DIAGNOSIS — Z8719 Personal history of other diseases of the digestive system: Secondary | ICD-10-CM | POA: Insufficient documentation

## 2012-07-05 DIAGNOSIS — R05 Cough: Secondary | ICD-10-CM | POA: Insufficient documentation

## 2012-07-05 DIAGNOSIS — Z9889 Other specified postprocedural states: Secondary | ICD-10-CM | POA: Insufficient documentation

## 2012-07-05 DIAGNOSIS — J3489 Other specified disorders of nose and nasal sinuses: Secondary | ICD-10-CM | POA: Insufficient documentation

## 2012-07-05 DIAGNOSIS — B349 Viral infection, unspecified: Secondary | ICD-10-CM

## 2012-07-05 DIAGNOSIS — Z2089 Contact with and (suspected) exposure to other communicable diseases: Secondary | ICD-10-CM | POA: Insufficient documentation

## 2012-07-05 DIAGNOSIS — B9789 Other viral agents as the cause of diseases classified elsewhere: Secondary | ICD-10-CM | POA: Insufficient documentation

## 2012-07-05 DIAGNOSIS — M5106 Intervertebral disc disorders with myelopathy, lumbar region: Secondary | ICD-10-CM | POA: Insufficient documentation

## 2012-07-05 LAB — INFLUENZA PANEL BY PCR (TYPE A & B)
H1N1 flu by pcr: NOT DETECTED
Influenza A By PCR: NEGATIVE
Influenza B By PCR: NEGATIVE

## 2012-07-05 LAB — RAPID STREP SCREEN (MED CTR MEBANE ONLY): Streptococcus, Group A Screen (Direct): NEGATIVE

## 2012-07-05 MED ORDER — IBUPROFEN 800 MG PO TABS: 800.0000 mg | ORAL_TABLET | Freq: Three times a day (TID) | ORAL | Status: DC

## 2012-07-05 MED ORDER — KETOROLAC TROMETHAMINE 60 MG/2ML IM SOLN
60.0000 mg | Freq: Once | INTRAMUSCULAR | Status: AC
Start: 2012-07-05 — End: 2012-07-05
  Administered 2012-07-05: 60 mg via INTRAMUSCULAR
  Filled 2012-07-05: qty 2

## 2012-07-05 NOTE — ED Notes (Signed)
Fever off and on for two weeks. Others symptoms started 4 days ago. Pt reports grandkids having cold symptoms.

## 2012-07-05 NOTE — ED Notes (Signed)
Pt reports that she has been sick for 2 weeks, with flu-like symptoms.  Low grade fever at times

## 2012-07-05 NOTE — ED Provider Notes (Signed)
History  This chart was scribed for [name] Octave, MD by Shari Heritage, ED Scribe. The patient was seen in room APA12/APA12. Patient's care was started at 0715.  CSN: [id number]  Arrival date & time 07/05/12  0706   First MD Initiated Contact with Patient 07/05/12 0715      Chief Complaint  Patient presents with  . Fever  . Nasal Congestion  . Cough    The history is provided by the patient. No language interpreter was used.    HPI Comments: [name] [name] is a 51 y.o. female who presents to the Emergency Department complaining of intermittent fever, nasal congestion and intermittent non-productive cough onset 2 weeks ago. Tmax at home was 101.8. There is associated generalized body aches, mild sore throat pain, moderate frontal headache and sinus pressure. Patient denies vomiting, abdominal pain or chest pain. She also reports some mild discomfort in her right ear and has decreased food and fluid intake. Patient has sick contacts - grandchildren have had similar symptoms. Patient has not had a flu shot this season. Patient states that she was seen at Urgent Care over the weekend and her flu screen was negative. She did not have a chest x-ray. She says that she was diagnosed with a bacterial infection, but could not state which one. She has been taking Cefuroxime 250 mg twice a day in addition to Mucinex. She states that she has also been taking Tylenol Cold with no relief. Patient has a medical history of heart palpitations. She has a 21-day monitor and says that she just took it off. Other medical history includes DJD (lumbosacral), herpes simplex II, colitis. Surgical history includes colonoscopy, tubal ligation, abdominal hysterectomy, breast excisional biopsy.  PCP - Gerda Diss   Past Medical History  Diagnosis Date  . Chest pain 2009    Consultation-[facility], negative chest CT; nl echo in 2005; h/o palpitations  . Palpitations   . Degenerative joint disease     + degenerative  joint disease of the lumbosacral spine  . Colitis 2010    self-limited  . Depression   . Herpes simplex type II infection     Past Surgical History  Procedure Date  . Colonoscopy 10/2010    proctitis; melanosis coli  . Tubal ligation   . Total abdominal hysterectomy 03/2007    fibroids; iron deficiency anemia  . Breast excisional biopsy 04/2003    Left; benign disease    Family History  Problem Relation Age of Onset  . Aneurysm Mother     Cerebral  . Coronary artery disease Father   . Lung cancer Brother     History  Substance Use Topics  . Smoking status: Former Smoker -- 0.5 packs/day for 15 years    Types: Cigarettes    Quit date: 07/06/2001  . Smokeless tobacco: Never Used  . Alcohol Use: No    OB History    Grav Para Term Preterm Abortions TAB SAB Ect Mult Living                  Review of Systems A complete 10 system review of systems was obtained and all systems are negative except as noted in the HPI and PMH.   Allergies  Doxycycline  Home Medications   Current Outpatient Rx  Name  Route  Sig  Dispense  Refill  . ATENOLOL 50 MG PO TABS   Oral   Take 50 mg by mouth daily.          Marland Kitchen  CITALOPRAM HYDROBROMIDE 40 MG PO TABS   Oral   Take 20 mg by mouth daily.          . INDOMETHACIN 25 MG PO CAPS   Oral   Take 25 mg by mouth as needed.         Marland Kitchen PANTOPRAZOLE SODIUM 40 MG PO TBEC   Oral   Take 40 mg by mouth daily.         Marland Kitchen VALACYCLOVIR HCL 1 G PO TABS                 BP 114/72  Pulse 100  Temp 99 F (37.2 C) (Oral)  Resp 20  Ht 5\' 1"  (1.549 m)  Wt 171 lb (77.565 kg)  BMI 32.31 kg/m2  SpO2 98%  Physical Exam  Constitutional: She is oriented to person, place, and time. She appears well-developed and well-nourished. No distress.  HENT:  Head: Normocephalic and atraumatic.  Left Ear: Tympanic membrane normal.  Nose: Rhinorrhea present. Right sinus exhibits no maxillary sinus tenderness and no frontal sinus tenderness.    Mouth/Throat: Oropharynx is clear and moist and mucous membranes are normal. No oropharyngeal exudate or posterior oropharyngeal erythema.       Right ear cerumen impaction. Oropharynx clear. No asymmetry. Uvula midline. No meningismus. Left TM normal.  Eyes: Conjunctivae normal are normal.  Neck: Neck supple.  Cardiovascular: Normal rate and regular rhythm.   No murmur heard. Pulmonary/Chest: Effort normal. No respiratory distress. She has no wheezes. She has no rales.  Abdominal: Soft. Bowel sounds are normal. She exhibits no distension. There is no tenderness. There is no rebound and no guarding.  Musculoskeletal: Normal range of motion. She exhibits no edema and no tenderness.  Lymphadenopathy:    She has no cervical adenopathy.  Neurological: She is alert and oriented to person, place, and time.  Skin: Skin is warm and dry.  Psychiatric: Her behavior is normal.    ED Course  Procedures (including critical care time) DIAGNOSTIC STUDIES: Oxygen Saturation is 98% on room air, normal by my interpretation.    COORDINATION OF CARE: 7:24 AM- Patient informed of current plan for treatment and evaluation and agrees with plan at this time.     Labs Reviewed  RAPID STREP SCREEN  INFLUENZA PANEL BY PCR    Dg Chest 2 View  07/05/2012  *RADIOLOGY REPORT*  Clinical Data: Fever, cough and congestion.  CHEST - 2 VIEW  Comparison: 01/21/2012.  Findings: The cardiac silhouette, mediastinal and hilar contours are normal and stable.  The lungs are clear.  No pleural effusion. The bony thorax is intact.  IMPRESSION: No acute cardiopulmonary findings.   Original Report Authenticated By: Rudie Meyer, M.D.      No diagnosis found.    MDM  One week of intermittent cough, congestion, body aches, chills, fever for 2 weeks of 101. Did not receive flu shot, multiple sick contacts at home. No nausea, vomiting, abdominal pain or chest pain.  Flu like symptoms.  No meningeal signs. Nontoxic  appearing, lungs clear. Supportive care for likely influenza.  Antipyretics, hydration, PCP followup. Flu swab sent.  I personally performed the services described in this documentation, which was scribed in my presence. The recorded information has been reviewed and is accurate.    [name] Octave, MD 07/05/12 [phone]

## 2012-08-04 ENCOUNTER — Other Ambulatory Visit: Payer: Self-pay | Admitting: Cardiology

## 2012-08-04 DIAGNOSIS — R002 Palpitations: Secondary | ICD-10-CM

## 2012-08-10 ENCOUNTER — Ambulatory Visit: Payer: BC Managed Care – PPO | Admitting: Cardiology

## 2012-08-19 ENCOUNTER — Ambulatory Visit: Payer: BC Managed Care – PPO | Admitting: Cardiology

## 2012-09-02 ENCOUNTER — Encounter: Payer: Self-pay | Admitting: Adult Health

## 2012-09-02 ENCOUNTER — Ambulatory Visit (INDEPENDENT_AMBULATORY_CARE_PROVIDER_SITE_OTHER): Payer: BC Managed Care – PPO | Admitting: Adult Health

## 2012-09-02 VITALS — BP 144/92 | HR 60 | Ht 61.0 in | Wt 161.4 lb

## 2012-09-02 DIAGNOSIS — R011 Cardiac murmur, unspecified: Secondary | ICD-10-CM | POA: Insufficient documentation

## 2012-09-02 DIAGNOSIS — R079 Chest pain, unspecified: Secondary | ICD-10-CM

## 2012-09-02 DIAGNOSIS — I10 Essential (primary) hypertension: Secondary | ICD-10-CM

## 2012-09-02 DIAGNOSIS — R002 Palpitations: Secondary | ICD-10-CM

## 2012-09-02 NOTE — Progress Notes (Deleted)
Name: [name] [name]    DOB: 1960/12/09  Age: 52 y.o.  MR#: [id number]       PCP:  Harlow Asa, MD      Insurance: Payor: BLUE [employer]  Plan: BCBS PPO OUT OF STATE  Product Type: *No Product type*    CC:   No chief complaint on file.   VS Filed Vitals:   09/02/12 1112  BP: 144/92  Pulse: 60  Height: 5\' 1"  (1.549 m)  Weight: 161 lb 6.4 oz (73.211 kg)  SpO2: 95%    Weights Current Weight  09/02/12 161 lb 6.4 oz (73.211 kg)  07/05/12 171 lb (77.565 kg)  06/27/12 173 lb (78.472 kg)    Blood Pressure  BP Readings from Last 3 Encounters:  09/02/12 144/92  07/05/12 114/72  06/27/12 92/60     Admit date:  (Not on file) Last encounter with RMR:  Visit date not found   Allergy Doxycycline  Current Outpatient Prescriptions  Medication Sig Dispense Refill  . atenolol (TENORMIN) 50 MG tablet Take 50 mg by mouth daily.       . citalopram (CELEXA) 40 MG tablet Take 20 mg by mouth daily.       Marland Kitchen ibuprofen (ADVIL,MOTRIN) 800 MG tablet Take 1 tablet (800 mg total) by mouth 3 (three) times daily.  21 tablet  0  . indomethacin (INDOCIN) 25 MG capsule Take 25 mg by mouth as needed.      . pantoprazole (PROTONIX) 40 MG tablet Take 40 mg by mouth daily.      Marland Kitchen PREMARIN vaginal cream Place 0.5 g vaginally as needed.       . valACYclovir (VALTREX) 1000 MG tablet        No current facility-administered medications for this visit.    Discontinued Meds:    Medications Discontinued During This Encounter  Medication Reason  . traZODone (DESYREL) 50 MG tablet Error  . cefUROXime (CEFTIN) 250 MG tablet Error    Patient Active Problem List  Diagnosis  . DEGENERATIVE JOINT DISEASE, RIGHT KNEE  . DISC DEGENERATION  . Chest pain  . Palpitations  . Laboratory test    LABS    Component Value Date/Time   NA 139 06/06/2008 0545   NA 137 06/05/2008 2338   NA 138 04/12/2008 1725   K 3.8 06/06/2008 0545   K 3.8 06/05/2008 2338   K 4.1 04/12/2008 1725   CL 104 06/06/2008 0545   CL  104 06/05/2008 2338   CL 105 04/12/2008 1725   CO2 26 06/06/2008 0545   CO2 28 06/05/2008 2338   CO2 30 04/12/2008 1725   GLUCOSE 112* 06/06/2008 0545   GLUCOSE 108* 06/05/2008 2338   GLUCOSE 87 04/12/2008 1725   BUN 6 06/06/2008 0545   BUN 9 06/05/2008 2338   BUN 9 04/12/2008 1725   CREATININE 0.77 06/06/2008 0545   CREATININE 0.79 06/05/2008 2338   CREATININE 0.96 04/12/2008 1725   CALCIUM 8.7 06/06/2008 0545   CALCIUM 9.4 06/05/2008 2338   CALCIUM 9.3 04/12/2008 1725   GFRNONAA >60 06/06/2008 0545   GFRNONAA >60 06/05/2008 2338   GFRNONAA >60 04/12/2008 1725   GFRAA  Value: >60        The eGFR has been calculated using the MDRD equation. This calculation has not been validated in all clinical 06/06/2008 0545   GFRAA  Value: >60        The eGFR has been calculated using the MDRD equation. This calculation has not  been validated in all clinical 06/05/2008 2338   GFRAA  Value: >60        The eGFR has been calculated using the MDRD equation. This calculation has not been validated in all clinical 04/12/2008 1725   CMP     Component Value Date/Time   NA 139 06/06/2008 0545   K 3.8 06/06/2008 0545   CL 104 06/06/2008 0545   CO2 26 06/06/2008 0545   GLUCOSE 112* 06/06/2008 0545   BUN 6 06/06/2008 0545   CREATININE 0.77 06/06/2008 0545   CALCIUM 8.7 06/06/2008 0545   PROT 7.0 04/12/2008 1725   ALBUMIN 4.1 04/12/2008 1725   AST 21 04/12/2008 1725   ALT 28 04/12/2008 1725   ALKPHOS 62 04/12/2008 1725   BILITOT 0.5 04/12/2008 1725   GFRNONAA >60 06/06/2008 0545   GFRAA  Value: >60        The eGFR has been calculated using the MDRD equation. This calculation has not been validated in all clinical 06/06/2008 0545       Component Value Date/Time   WBC 12.4* 06/06/2008 0545   WBC 13.4* 06/05/2008 2338   WBC 11.2* 04/12/2008 1725   HGB 11.7* 06/06/2008 0545   HGB 12.8 06/05/2008 2338   HGB 12.9 04/12/2008 1725   HCT 35.7* 06/06/2008 0545   HCT 38.1 06/05/2008 2338   HCT 39.2 04/12/2008 1725   MCV 84.4 06/06/2008 0545    MCV 84.3 06/05/2008 2338   MCV 85.7 04/12/2008 1725    Lipid Panel  No results found for this basename: chol, trig, hdl, cholhdl, vldl, ldlcalc    ABG No results found for this basename: phart, pco2, pco2art, po2, po2art, hco3, tco2, acidbasedef, o2sat     Lab Results  Component Value Date   TSH 4.643 ***Test methodology is 3rd generation TSH**** 06/06/2008   BNP (last 3 results) No results found for this basename: PROBNP,  in the last 8760 hours Cardiac Panel (last 3 results) No results found for this basename: CKTOTAL, CKMB, TROPONINI, RELINDX,  in the last 72 hours  Iron/TIBC/Ferritin No results found for this basename: iron, tibc, ferritin     EKG Orders placed in visit on 08/04/12  . CARDIAC EVENT MONITOR     Prior Assessment and Plan Problem List as of 09/02/2012     ICD-9-CM   DEGENERATIVE JOINT DISEASE, RIGHT KNEE   DISC DEGENERATION   Chest pain   Palpitations   Last Assessment & Plan   06/27/2012 Office Visit Written 06/28/2012 10:24 AM by Kathlen Brunswick, MD     The patient has had a good response to treatment with beta blocker, but continues to note intermittent mild symptoms, which likely reflect PACs and/or PVCs.  The possibility of a somewhat more worrisome arrhythmia such as PSVT or paroxysmal atrial fibrillation is certainly a consideration.  Since episodes do not occur every day or every other day, Holter monitoring will not likely be of benefit.  We will proceed with event recording and reassess this nice woman in one month.  She is relatively bradycardic at rest, which may preclude a further increase in her dose of atenolol.  A combined beta blocker/partial agonist such as acebutolol might be a reasonable alternative for her.    Laboratory test       Imaging: No results found.

## 2012-09-02 NOTE — Assessment & Plan Note (Signed)
She is asymptomatic today. Last echo was in 2009, and found to be normal. Will have echo repeated on next office visit if murmur continues to be prominent.

## 2012-09-02 NOTE — Assessment & Plan Note (Signed)
No further complaints of this.

## 2012-09-02 NOTE — Progress Notes (Signed)
   HPI: [name] [name] is a 52 y/o patient of Dr. Dietrich Pates we are seeing for ongoing assessment and treatment of chest pain, palpitations, and hypertension. She has a cardiac monitor placed to evaluate this. She had no medication changes prior to her evaluation. She comes today without complaint with the exception of fatigue. She has had no further chest discomfort, or new symptoms of dyspnea or dizziness.  Allergies  Allergen Reactions  . Doxycycline Other (See Comments)    Unknown    Current Outpatient Prescriptions  Medication Sig Dispense Refill  . atenolol (TENORMIN) 50 MG tablet Take 50 mg by mouth daily.       . citalopram (CELEXA) 40 MG tablet Take 20 mg by mouth daily.       Marland Kitchen ibuprofen (ADVIL,MOTRIN) 800 MG tablet Take 1 tablet (800 mg total) by mouth 3 (three) times daily.  21 tablet  0  . indomethacin (INDOCIN) 25 MG capsule Take 25 mg by mouth as needed.      . pantoprazole (PROTONIX) 40 MG tablet Take 40 mg by mouth daily.      Marland Kitchen PREMARIN vaginal cream Place 0.5 g vaginally as needed.       . valACYclovir (VALTREX) 1000 MG tablet        No current facility-administered medications for this visit.    Past Medical History  Diagnosis Date  . Chest pain 2009    Consultation-[facility], negative chest CT; nl echo in 2005; h/o palpitations  . Palpitations   . Degenerative joint disease     + degenerative joint disease of the lumbosacral spine  . Colitis 2010    self-limited  . Depression   . Herpes simplex type II infection     Past Surgical History  Procedure Laterality Date  . Colonoscopy  10/2010    proctitis; melanosis coli  . Tubal ligation    . Total abdominal hysterectomy  03/2007    fibroids; iron deficiency anemia  . Breast excisional biopsy  04/2003    Left; benign disease    ROS: Review of systems complete and found to be negative unless listed above  PHYSICAL EXAM BP 144/92  Pulse 60  Ht 5\' 1"  (1.549 m)  Wt 161 lb 6.4 oz (73.211 kg)  BMI 30.51 kg/m2   SpO2 95%  General: Well developed, well nourished, in no acute distress Head: Eyes PERRLA, No xanthomas.   Normal cephalic and atramatic  Lungs: Clear bilaterally to auscultation and percussion. Heart: HRRR S1 S2,1/6 systolic murmur non radiating.   Pulses are 2+ & equal.            No carotid bruit. No JVD.  No abdominal bruits. No femoral bruits. Abdomen: Bowel sounds are positive, abdomen soft and non-tender without masses or                  Hernia's noted. Msk:  Back normal, normal gait. Normal strength and tone for age. Extremities: No clubbing, cyanosis or edema.  DP +1 Neuro: Alert and oriented X 3. Psych:  Good affect, responds appropriately    ASSESSMENT AND PLAN

## 2012-09-02 NOTE — Assessment & Plan Note (Signed)
I have reviewed the cardiac monitor and she was found to be in predominately NSR, with occasional PAC's. No other arrhythmias are seen. She is given reassurance. Continue tenormin.

## 2012-09-02 NOTE — Patient Instructions (Addendum)
Your physician recommends that you schedule a follow-up appointment in: 5 MONTHS   

## 2012-09-02 NOTE — Assessment & Plan Note (Signed)
Blood pressure is mildly elevated today compared to previous readings. I have checked it manually in the exam room. Reading 146/88. Will continue to watch this, as this is the first reading with elevation. She is to keep a record of BP at home. If remains elevated, will consider adding HCTZ to medication regimen on next visit.

## 2012-09-29 ENCOUNTER — Encounter: Payer: Self-pay | Admitting: *Deleted

## 2012-09-30 ENCOUNTER — Encounter: Payer: Self-pay | Admitting: Obstetrics & Gynecology

## 2012-09-30 ENCOUNTER — Ambulatory Visit (INDEPENDENT_AMBULATORY_CARE_PROVIDER_SITE_OTHER): Payer: BC Managed Care – PPO | Admitting: Obstetrics & Gynecology

## 2012-09-30 VITALS — Ht 61.0 in | Wt 171.0 lb

## 2012-09-30 DIAGNOSIS — Z01419 Encounter for gynecological examination (general) (routine) without abnormal findings: Secondary | ICD-10-CM

## 2012-09-30 DIAGNOSIS — Z0000 Encounter for general adult medical examination without abnormal findings: Secondary | ICD-10-CM

## 2012-09-30 DIAGNOSIS — Z1212 Encounter for screening for malignant neoplasm of rectum: Secondary | ICD-10-CM

## 2012-09-30 NOTE — Patient Instructions (Signed)
Pelvic Exam A pelvic (gynecologic) exam is an exam of a woman's outer and inner genitals and reproductive organs. At age 52, or before a woman starts to have sexual intercourse, she should have her first pelvic exam. Pelvic exams allow your caregiver to check on normal development and screen for health problems. These exams should be done regularly throughout a woman's life. Usually, a general physical exam is done first. An exam of the breasts is also done. At this visit, you can ask questions about your health, body, menstrual cycles, sex, and birth control methods. Your caregiver will also ask you questions about your health, family health, menstrual periods, immunizations, and if you are sexually active. The information shared between you and your caregiver is kept confidential. REASONS FOR A PELVIC EXAM  Annual exam and Pap test. A Pap test removes cells from the cervix gently with a spatula and a small brush. The cells are tested for infection, precancer, and cancer.  A Pap test is done to screen for cervical cancer.  The first Pap test should be done at age 21.  Between ages 21 and 29, Pap tests are repeated every 2 years.  Beginning at age 30, you are advised to have a Pap test every 3 years as long as your past 3 Pap tests have been normal.  Some women have medical problems that increase the chance of getting cervical cancer. Talk to your caregiver about these problems. It is especially important to talk to your caregiver if a new problem develops soon after your last Pap test. In these cases, your caregiver may recommend more frequent screening and Pap tests.  The above recommendations are the same for women who have or have not gotten the vaccine for HPV (Human Papillomavirus).  If you had a hysterectomy for a problem that was not cancer or a condition that could lead to cancer, then you no longer need Pap tests. However, even if you no longer need a Pap test, a regular exam is a good  idea to make sure no other problems are starting.   If you are between ages 65 and 70, and you have had normal Pap tests going back 10 years, you no longer need Pap tests. However, even if you no longer need a Pap test, a regular exam is a good idea to make sure no other problems are starting.   If you have had past treatment for cervical cancer or a condition that could lead to cancer, you need Pap tests and screening for cancer for at least 20 years after your treatment.  If Pap tests have been discontinued, risk factors (such as a new sexual partner) need to be re-assessed to determine if screening should be resumed.  Some women may need screenings more often if they are at high risk for cervical cancer.  Make sure your female organs are normal and functioning correctly.  Evaluate a mass or other symptoms that suggest a reproductive system cancer.  Explore why you are not able to get pregnant (infertility).  Find a cause for vaginal discharge, itching, or burning.  Get certain types of birth control or start hormone therapy.  Look for causes of urinary incontinence or sexual problems.  Look for signs of sexually transmitted infection (STI).  Follow the progression of labor.  Determine if pregnancy is present or how far advanced the pregnancy is.  You have severe cramps during your menstrual period.  You have pain during sexual intercourse.  You have abnormal   menstrual periods.  You have no menstrual period by the age of 16. PROCEDURE   A pelvic exam is usually painless but may cause mild discomfort.  In unusual circumstances or in young girls, medicines may be used for comfort. A pelvic exam is not done routinely before a girl is sexually active. Special circumstances such as rape, trauma, or medical problems may require an exam.  You will remove all your clothes and will be given a gown. Usually, there is a nurse in the room during the exam and you can have someone  from your family with you also.  The general physical exam will be done first.  Before the pelvic exam starts, the woman lies down on her back on a special table. She puts the heels of her feet into foot rests (stirrups) with her legs apart. A gown, cloth, or paper drape is usually placed over her belly (abdomen) and legs. First, the caregiver checks the normal arrangement of body parts of the outer genitals. This includes the clitoris, vaginal opening, hymen, labia, and the perineal area between the vagina and rectum. The labia are the skin folds surrounding the vaginal opening. The tube that carries urine (urethra) is also examined.  An internal exam is done next. First, the caregiver inserts an instrument called a speculum into the vagina. The speculum has lubricant on it. The speculum helps hold the vaginal walls apart. The caregiver can then examine the vagina and cervix, which is the opening to the womb (uterus). Cultures of any discharge may be taken to check for an infection. A Pap test may be done.  After the internal exam is done, the speculum is removed. The caregiver uses latex gloves with a lubricant on the fingers to gently press against various pelvic organs from inside the vagina while the other hand is on the lower belly. The caregiver will note any tenderness or abnormalities.  If a pelvic exam is done on a woman who is thought to be in labor, her caregiver can check on the baby and how far her cervix has opened.  Following the exam, you will get dressed and can speak with your caregiver.  Ask your caregiver when and how often you should return for future visits. Finding out the results of your test Ask when your test results will be ready. Make sure you get your test results. TO HAVE A HEALTHY LIFESTYLE:  Follow your caregiver's advice regarding follow-up and future visits.  Get the necessary immunizations according to your age and any traveling you may do.  Eat a balanced,  nourishing diet.  Get plenty of rest and sleep.  Exercise regularly.  Maintain a healthy weight.  Do not smoke or take illegal drugs.  Drink alcohol in moderation or not at all.  If you are sexually active, use some form of birth control if you do not plan to get pregnant.  If you are sexually active, practice safe sex by using a condom to protect against sexually transmitted disease (STD).  Get help or counseling if you have emotional problems. Document Released: 09/12/2002 Document Revised: 09/14/2011 Document Reviewed: 09/18/2009 [employer] Patient Information 2013 [employer].  

## 2012-10-02 ENCOUNTER — Encounter: Payer: Self-pay | Admitting: Obstetrics & Gynecology

## 2012-10-02 NOTE — Progress Notes (Signed)
Patient ID: [name] [name], female   DOB: Jun 01, 1961, 52 y.o.   MRN: [id number] Subjective:     [name] [name] is a 52 y.o. female here for a routine exam.  Current complaints: passing of stool per vagina for past 2 years, colonoscopy by Dr Karilyn Cota was normal(I arranged it).  Personal health questionnaire reviewed: yes.   Gynecologic History No LMP recorded. Patient has had a hysterectomy. Contraception: status post hysterectomy Last Pap: 2008, year of her hysterectomy. Results were: normal Last mammogram: 2013. Results were: normal  Obstetric History OB History   Grav Para Term Preterm Abortions TAB SAB Ect Mult Living   4 4 4       4      # Outc Date GA Lbr Len/2nd Wgt Sex Del Anes PTL Lv   1 TRM         Yes   2 TRM         Yes   3 TRM         Yes   4 TRM         Yes       The following portions of the patient's history were reviewed and updated as appropriate: allergies, current medications, past family history, past medical history, past social history, past surgical history and problem list.  Review of Systems  Review of Systems  Constitutional: Negative for fever, chills, weight loss, malaise/fatigue and diaphoresis.  HENT: Negative for hearing loss, ear pain, nosebleeds, congestion, sore throat, neck pain, tinnitus and ear discharge.   Eyes: Negative for blurred vision, double vision, photophobia, pain, discharge and redness.  Respiratory: Negative for cough, hemoptysis, sputum production, shortness of breath, wheezing and stridor.   Cardiovascular: Negative for chest pain, palpitations, orthopnea, claudication, leg swelling and PND.  Gastrointestinal: Negative for abdominal pain. Negative for heartburn, nausea, vomiting, diarrhea, constipation, blood in stool and melena.  Genitourinary: Negative for dysuria, urgency, frequency, hematuria and flank pain, + stool per vagina when she does not go immediately when she has the sensation, no gas per vagina  Musculoskeletal:  Negative for myalgias, back pain, joint pain and falls.  Skin: Negative for itching and rash.  Neurological: Negative for dizziness, tingling, tremors, sensory change, speech change, focal weakness, seizures, loss of consciousness, weakness and headaches.  Endo/Heme/Allergies: Negative for environmental allergies and polydipsia. Does not bruise/bleed easily.  Psychiatric/Behavioral: Negative for depression, suicidal ideas, hallucinations, memory loss and substance abuse. The patient is not nervous/anxious and does not have insomnia.         Objective:    Ht 5\' 1"  (1.549 m)  Wt 171 lb (77.565 kg)  BMI 32.33 kg/m2  General Appearance:    Alert, cooperative, no distress, appears stated age  Head:    Normocephalic, without obvious abnormality, atraumatic  Eyes:    PERRL, conjunctiva/corneas clear, EOM's intact, fundi    benign, both eyes  Ears:    Normal TM's and external ear canals, both ears  Nose:   Nares normal, septum midline, mucosa normal, no drainage    or sinus tenderness  Throat:   Lips, mucosa, and tongue normal; teeth and gums normal  Neck:   Supple, symmetrical, trachea midline, no adenopathy;    thyroid:  no enlargement/tenderness/nodules; no carotid   bruit or JVD  Back:     Symmetric, no curvature, ROM normal, no CVA tenderness  Lungs:     Clear to auscultation bilaterally, respirations unlabored  Chest Wall:    No tenderness or deformity  Heart:    Regular rate and rhythm, S1 and S2 normal, no murmur, rub   or gallop  Breast Exam:    No tenderness, masses, or nipple abnormality  Abdomen:     Soft, non-tender, bowel sounds active all four quadrants,    no masses, no organomegaly  Genitalia:    Normal female without lesion, discharge or tenderness, vagina post menopausal, cuff intact, no stool redness lesion of any kind seen, no evidence of stool per vagina today  Rectal:    Normal tone, normal prostate, no masses or tenderness;   guaiac negative stool  Extremities:    Extremities normal, atraumatic, no cyanosis or edema  Pulses:   2+ and symmetric all extremities  Skin:   Skin color, texture, turgor normal, no rashes or lesions  Lymph nodes:   Cervical, supraclavicular, and axillary nodes normal  Neurologic:   CNII-XII intact, normal strength, sensation and reflexes    throughout      Assessment:    Healthy female exam.  Patient history of stool per vagina x 2 years   Plan:    i discussed with patient the difficulty at times finding a small fistula, patient aware and i said I would call and talk with dr Tyron Russell to find best study to order.

## 2012-10-15 ENCOUNTER — Other Ambulatory Visit: Payer: Self-pay | Admitting: Family Medicine

## 2012-10-17 NOTE — Telephone Encounter (Signed)
Follow up visit 09/02/12

## 2012-10-27 ENCOUNTER — Other Ambulatory Visit: Payer: Self-pay | Admitting: Family Medicine

## 2012-12-06 ENCOUNTER — Telehealth: Payer: Self-pay | Admitting: Obstetrics & Gynecology

## 2012-12-12 ENCOUNTER — Encounter: Payer: Self-pay | Admitting: *Deleted

## 2012-12-14 ENCOUNTER — Encounter: Payer: Self-pay | Admitting: Nurse Practitioner

## 2012-12-14 ENCOUNTER — Ambulatory Visit (INDEPENDENT_AMBULATORY_CARE_PROVIDER_SITE_OTHER): Payer: BC Managed Care – PPO | Admitting: Nurse Practitioner

## 2012-12-14 VITALS — BP 123/80 | HR 68 | Wt 176.6 lb

## 2012-12-14 DIAGNOSIS — N951 Menopausal and female climacteric states: Secondary | ICD-10-CM

## 2012-12-14 DIAGNOSIS — F32A Depression, unspecified: Secondary | ICD-10-CM | POA: Insufficient documentation

## 2012-12-14 DIAGNOSIS — K219 Gastro-esophageal reflux disease without esophagitis: Secondary | ICD-10-CM

## 2012-12-14 DIAGNOSIS — F3289 Other specified depressive episodes: Secondary | ICD-10-CM

## 2012-12-14 DIAGNOSIS — F329 Major depressive disorder, single episode, unspecified: Secondary | ICD-10-CM

## 2012-12-14 DIAGNOSIS — E669 Obesity, unspecified: Secondary | ICD-10-CM | POA: Insufficient documentation

## 2012-12-14 MED ORDER — PHENTERMINE HCL 37.5 MG PO TABS: 37.5000 mg | ORAL_TABLET | Freq: Every day | ORAL | Status: DC

## 2012-12-14 NOTE — Assessment & Plan Note (Signed)
Stopped Belviq, no weight loss noted. Restart phentermine 37.5 mg every morning, no patient has taken this without difficulty in the past. DC if any problems including palpitations. Recheck in 3 months.

## 2012-12-14 NOTE — Progress Notes (Signed)
Subjective:  Presents for routine followup. Is working about 56 hours per week. Has experienced more fatigue since her hours have increased. Takes Tylenol PM to help her sleep. Stopped trazodone due to headaches. Did not see any weight loss on Belviq. Would like to restart her phentermine. Has taken this without difficulty in the past. Has a history of palpitations but none specifically with phentermine use. Recent checkup with her cardiologist, heart rate is actually low but no changes in her atenolol. No chest pain or shortness of breath. Reflux is stable, uses Protonix on a when necessary basis. No abdominal pain. Bowels normal limit. Uses her estrogen cream once a month for vaginal dryness.  Objective:   BP 123/80  Pulse 68  Wt 176 lb 9.6 oz (80.105 kg)  BMI 33.39 kg/m2 NAD. Alert, oriented. Lungs clear. Heart regular rate rhythm. Abdomen soft nondistended nontender.  Assessment:Chronic depression  GERD (gastroesophageal reflux disease)  Morbid obesity  Menopausal vaginal dryness  Plan: Continue current meds as directed. Discussed importance of stress reduction. Luvena OTC as directed for vaginal health. Restart phentermine 37.5 mg 1 by mouth every morning. DC if any adverse affects including palpitations. Recheck in 3 months, call back sooner if any problems.

## 2012-12-14 NOTE — Patient Instructions (Signed)
Luvena 2-3 x per week as needed for vaginal health 

## 2012-12-14 NOTE — Assessment & Plan Note (Signed)
Currently on Celexa 40 mg daily. Working 56 hours per week. Discussed importance of stress reduction. Continue Tylenol PM as directed for insomnia.

## 2012-12-14 NOTE — Assessment & Plan Note (Signed)
Stable using Protonix on a when necessary basis.

## 2013-01-30 ENCOUNTER — Other Ambulatory Visit: Payer: Self-pay | Admitting: Family Medicine

## 2013-02-08 ENCOUNTER — Telehealth: Payer: Self-pay | Admitting: *Deleted

## 2013-02-08 NOTE — Telephone Encounter (Signed)
Referral info from Dr Aldona Bar received.  Call to patient to schedule appointment.  LMTCB.

## 2013-02-08 NOTE — Telephone Encounter (Signed)
Patient returned call and appointment scheduled for 02-22-13 at 1100.  Patient needed AM appt since works second shift.  (but not at 7am).  Is this appointment ok?

## 2013-02-08 NOTE — Telephone Encounter (Signed)
I think this date is fine.  I could not really see another date that would work well given the time of day we are targeting for her due to her work schedule.    Please inform Dr. Mechele Collin office of the appointment date and time:  [phone].  Thanks.

## 2013-02-08 NOTE — Telephone Encounter (Signed)
Call to Dr Mechele Collin office and notified Angie of patient's appointment date and time.

## 2013-02-09 ENCOUNTER — Other Ambulatory Visit (HOSPITAL_COMMUNITY): Payer: Self-pay | Admitting: Family Medicine

## 2013-02-22 ENCOUNTER — Encounter: Payer: Self-pay | Admitting: Obstetrics and Gynecology

## 2013-02-22 ENCOUNTER — Ambulatory Visit (INDEPENDENT_AMBULATORY_CARE_PROVIDER_SITE_OTHER): Payer: BC Managed Care – PPO | Admitting: Obstetrics and Gynecology

## 2013-02-22 VITALS — BP 122/80 | HR 60 | Ht 61.0 in | Wt 163.0 lb

## 2013-02-22 DIAGNOSIS — N823 Fistula of vagina to large intestine: Secondary | ICD-10-CM

## 2013-02-22 DIAGNOSIS — N824 Other female intestinal-genital tract fistulae: Secondary | ICD-10-CM

## 2013-02-22 NOTE — Progress Notes (Signed)
Patient ID: [name] [name], female   DOB: [birth year], 52 y.o.   MRN: [id number]  52 y.o.   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  Patient has bowel material coming through the inside of the vagina. No pain.  No chronic vaginal mucusy drainage. Some some rectal urgency for 2 -3 years also. No true fecal incontinence from the anal opening.  Some gas control problems.   4 vaginal deliveries.   Had some minor tearing with the second delivery, 32 year ago - delivered prematurely, 4 pounds.  Used forceps.   No other sutures with the other deliveries.  Status post abdominal hysterectomy at [facility] with Dr. Despina Hidden - fibroids.   Uncertain if ovaries were removed.   Diagnosis of colitis in 2010 - colonoscopy by Dr. Karilyn Cota. No treatment given.   Had a lot of abdominal problems in the past.  History of bloating.  Can have pain episodes, but not in a long time.    Patient's last menstrual period was 12/04/2008.           Last mammogram:  [facility] - WNL    Family History  Problem Relation Age of Onset  . Aneurysm Mother     Cerebral  . Hypertension Mother   . Hyperlipidemia Mother   . Stroke Mother   . Coronary artery disease Father   . Hypertension Father   . Hyperlipidemia Father   . Lung cancer Brother   . Cancer Brother     Patient Active Problem List   Diagnosis Date Noted  . Morbid obesity 12/14/2012  . GERD (gastroesophageal reflux disease) 12/14/2012  . Chronic depression 12/14/2012  . Heart murmur, systolic 09/02/2012  . Essential hypertension 09/02/2012  . Laboratory test 06/28/2012  . Chest pain   . Palpitations   . DEGENERATIVE JOINT DISEASE, RIGHT KNEE 10/03/2009  . DISC DEGENERATION 10/24/2007    Past Medical History  Diagnosis Date  . Chest pain 2009    Consultation-[facility], negative chest CT; nl echo in 2005; h/o palpitations  . Palpitations   . Degenerative joint  disease     + degenerative joint disease of the lumbosacral spine  . Colitis 2010    self-limited  . Depression   . Herpes simplex type II infection   . Allergic rhinitis   . Back pain   . Fibroid   . Heart murmur   . STD (sexually transmitted disease)     HSV II    Past Surgical History  Procedure Laterality Date  . Colonoscopy  10/2010    proctitis; melanosis coli  . Tubal ligation    . Breast excisional biopsy  04/2003    Left; benign disease  . Abdominal hysterectomy  03/2007    fibroids  . Right oophorectomy  03/2007  . Abdominal hysterectomy  2010    TAH ?BSO--done in [place], [place]    Allergies: Doxycycline  Current Outpatient Prescriptions  Medication Sig Dispense Refill  . atenolol (TENORMIN) 50 MG tablet TAKE 1 TABLET BY MOUTH EVERY DAY FOR PALPITATION  90 tablet  1  . celecoxib (CELEBREX) 200 MG capsule Take 200 mg by mouth daily.      . citalopram (CELEXA) 40 MG tablet TAKE 1 TABLET BY MOUTH EVERY DAY  90 tablet  1  . pantoprazole (PROTONIX) 40 MG tablet Take 40 mg by mouth daily.      Marland Kitchen  phentermine (ADIPEX-P) 37.5 MG tablet Take 1 tablet (37.5 mg total) by mouth daily before breakfast.  30 tablet  2  . PREMARIN vaginal cream Place 0.5 g vaginally as needed.       . valACYclovir (VALTREX) 1000 MG tablet TAKE 1 TABLET BY MOUTH DAILY  30 tablet  5  . ibuprofen (ADVIL,MOTRIN) 800 MG tablet Take 1 tablet (800 mg total) by mouth 3 (three) times daily.  21 tablet  0   No current facility-administered medications for this visit.    ROS: Pertinent items are noted in HPI.  Social Hx:  Passenger transport manager.  Exam:    BP 122/80  Pulse 60  Ht 5\' 1"  (1.549 m)  Wt 163 lb (73.936 kg)  BMI 30.81 kg/m2  LMP 12/04/2008   Wt Readings from Last 3 Encounters:  02/22/13 163 lb (73.936 kg)  12/14/12 176 lb 9.6 oz (80.105 kg)  09/30/12 171 lb (77.565 kg)     Ht Readings from Last 3 Encounters:  02/22/13 5\' 1"  (1.549 m)  09/30/12 5\' 1"  (1.549 m)  09/02/12 5\' 1"  (1.549 m)     General appearance: alert, cooperative and appears stated age Lungs: clear to auscultation bilaterally Breasts: Inspection negative, No nipple retraction or dimpling, No nipple discharge or bleeding, No axillary or supraclavicular adenopathy, Normal to palpation without dominant masses Heart: regular rate and rhythm Abdomen: soft, non-tender; bowel sounds normal; no masses,  no organomegaly Neurologic: Grossly normal   Pelvic: External genitalia:  no lesions              Urethra:  normal appearing urethra with no masses, tenderness or lesions              Bartholins and Skenes: normal                 Vagina: normal appearing vagina with normal color and discharge, no lesions              Cervix:  absent                   Bimanual Exam:  Uterus: absent                                      Adnexa: nontender and no masses                                      Rectovaginal: Confirms                                      Anus:  normal sphincter tone, no lesions  Colposcopy of vagina - No fistula identifiable in vagina or perineum.  Assessment Rectovaginal fistula.  No fistula identifiable on colposcopic exam today.   Diagnosis of colitis. Fecal urgency.   Plan  We discussed using a methylene soaked gauze rectally to identify the fistula tract. Will refer to Dr. Karie Soda for further evaluation and coordination of surgical care as the patient has potential for inflammatory bowel disease as well as need for a sphincter repair. I told the patient I would be happy to be present for her surgical care.  An After Visit Summary was printed and given to the patient.

## 2013-02-22 NOTE — Patient Instructions (Signed)
We will refer you to Dr. Karie Soda of [facility].

## 2013-03-01 ENCOUNTER — Telehealth: Payer: Self-pay | Admitting: Obstetrics and Gynecology

## 2013-03-01 NOTE — Telephone Encounter (Signed)
Referral has been placed but appt not scheduled.  Call back to patient and left message that I will follow up with this in am to facilitate scheduling.

## 2013-03-01 NOTE — Telephone Encounter (Signed)
Pt thought Dr. Edward Jolly was going to refer her to another doctor and she has not heard anything.

## 2013-03-02 NOTE — Telephone Encounter (Signed)
Call to home/cell and left message about appt with Dr. Michaell Cowing on 03-13-13 at 2:30, arriving at 2 pm. Phone number given to reschedule.

## 2013-03-08 ENCOUNTER — Other Ambulatory Visit: Payer: Self-pay | Admitting: Family Medicine

## 2013-03-08 DIAGNOSIS — Z09 Encounter for follow-up examination after completed treatment for conditions other than malignant neoplasm: Secondary | ICD-10-CM

## 2013-03-13 ENCOUNTER — Ambulatory Visit (HOSPITAL_COMMUNITY)
Admission: RE | Admit: 2013-03-13 | Discharge: 2013-03-13 | Disposition: A | Discharge status: Home / Self Care | Payer: BC Managed Care – PPO | Source: Ambulatory Visit | Attending: Family Medicine | Admitting: Family Medicine

## 2013-03-13 ENCOUNTER — Ambulatory Visit (INDEPENDENT_AMBULATORY_CARE_PROVIDER_SITE_OTHER): Payer: Self-pay | Admitting: Surgery

## 2013-03-13 DIAGNOSIS — Z1231 Encounter for screening mammogram for malignant neoplasm of breast: Secondary | ICD-10-CM | POA: Insufficient documentation

## 2013-03-13 DIAGNOSIS — Z09 Encounter for follow-up examination after completed treatment for conditions other than malignant neoplasm: Secondary | ICD-10-CM

## 2013-03-17 ENCOUNTER — Other Ambulatory Visit: Payer: Self-pay | Admitting: Family Medicine

## 2013-03-17 ENCOUNTER — Ambulatory Visit: Payer: BC Managed Care – PPO | Admitting: Nurse Practitioner

## 2013-03-17 DIAGNOSIS — R928 Other abnormal and inconclusive findings on diagnostic imaging of breast: Secondary | ICD-10-CM

## 2013-03-21 ENCOUNTER — Encounter (INDEPENDENT_AMBULATORY_CARE_PROVIDER_SITE_OTHER): Payer: Self-pay | Admitting: Surgery

## 2013-03-21 ENCOUNTER — Ambulatory Visit (INDEPENDENT_AMBULATORY_CARE_PROVIDER_SITE_OTHER): Payer: BC Managed Care – PPO | Admitting: Surgery

## 2013-03-21 VITALS — BP 100/78 | HR 69 | Temp 97.0°F | Ht 61.0 in | Wt 160.8 lb

## 2013-03-21 DIAGNOSIS — N824 Other female intestinal-genital tract fistulae: Secondary | ICD-10-CM

## 2013-03-21 DIAGNOSIS — N823 Fistula of vagina to large intestine: Secondary | ICD-10-CM | POA: Insufficient documentation

## 2013-03-21 NOTE — Patient Instructions (Addendum)
Please obtain x-ray study (CT scan of pelvis with rectal enema contrast) to help document evidence of a fistula.  Please call us when that result is done for further planning.  A rectovaginal fistula is an abnormal connection between the lower portion of your large intestine - your rectum - and your vagina. Contents of your bowel can leak through the fistula, meaning you might pass gas or stool through your vagina.   A rectovaginal fistula may result from an injury during childbirth, Crohn's disease or other inflammatory bowel disease, radiation treatment or cancer in the pelvic area, or a complication following surgery in the pelvic area.   The symptoms of a rectovaginal fistula often cause emotional distress as well as physical discomfort, which can impact self-esteem and intimate relationships. Though bringing up the subject with your doctor may be difficult, it's important to have a rectovaginal fistula evaluated. Some rectovaginal fistulas may close on their own, but most need to be repaired surgically.  Depending on the size and location of the fistula, you may have minor symptoms or significant problems with continence and hygiene. Signs and symptoms of a rectovaginal fistula may include:  .Passage of gas, stool or pus from your vagina  .A foul-smelling vaginal discharge  .Recurrent vaginal or urinary tract infections  .Irritation or pain in the vulva, vagina and the area between your vagina and anus (perineum)  .Pain during sexual intercourse   A rectovaginal fistula may form as a result of:  .Injuries during childbirth. Injuries during delivery are the most common cause of rectovaginal fistulas. Such injuries include tears in the perineum that extend to the bowel or an infection or tear of an episiotomy - a surgical incision to enlarge the perineum during vaginal delivery. These may happen following a long, difficult labor. Fistulas occurring from childbirth may also involve injury to your  anal sphincter, the rings of muscle at the end of the rectum that help you hold in stool.  .Crohn's disease. The second most common cause of rectovaginal fistulas, Crohn's disease is a type of inflammatory bowel disease in which the lining of your digestive tract becomes inflamed. Most women with Crohn's disease never develop a rectovaginal fistula, but having Crohn's disease does increase your risk of the condition.  .Cancer or radiation treatment in your pelvic area. A cancerous tumor in your rectum, cervix, vagina, uterus or anal canal can lead to development of a rectovaginal fistula. Radiation therapy for cancers in these areas can also put you at risk of developing a fistula. A fistula caused by radiation usually forms within two years following the treatment.  .Surgery involving your vagina, perineum, rectum or anus. Prior surgery in your lower pelvic region, such as removal of your uterus (hysterectomy), in rare cases can lead to development of a fistula.  .Other causes. Rarely, a rectovaginal fistula may be caused by infections in your anus or rectum; infections of small, bulging pouches in your digestive tract (diverticulitis); long-term inflammation of your colon and rectum (ulcerative colitis); or vaginal injury other than during childbirth.   Physical exam   Your doctor will perform a physical exam to try to locate the rectovaginal fistula and check for a possible tumor mass, infection or abscess. The doctor's exam includes inspecting your vagina, anus and the area between them (perineum) with a gloved hand.   Unless the fistula is very low in the vagina and readily visible, your doctor may use a speculum to see the inside of your vagina. An instrument similar  to a speculum, called a proctoscope, may be inserted into your anus and rectum to check for problems. Your doctor may take a sample of tissue for lab analysis (biopsy) during the procedure.   Tests for identifying fistulas   Often a  fistula isn't found during the physical exam. Your doctor may recommend other tests, such as those below, to locate and evaluate a rectovaginal fistula. These tests can also help your medical team in planning for surgery.  .Contrast tests. A vaginogram or a barium enema can help identify a fistula located in the upper rectum. These tests use a contrast material to show either the vagina or the bowel on an X-ray image.  .Blue dye test. This test involves placing a tampon into your vagina, then injecting blue dye into your rectum. Blue staining on the tampon shows the presence of a fistula.  .Computerized tomography (CT) scan. A CT scan of your abdomen and pelvis provides more detail than a standard X-ray does. The CT scan can help locate a fistula and determine its cause.  .Magnetic resonance imaging (MRI). This test creates images of soft tissues in your body. MRI can show the location of a fistula, as well as involvement of pelvic organs or the presence of a tumor.  .Anorectal ultrasound. This procedure uses sound waves to produce a video image of your anus and rectum. Your doctor inserts a narrow, wand-like instrument into your anus and rectum. Anorectal ultrasound can evaluate the structure of your anal sphincter and may show injury caused during childbirth.  .Anorectal manometry. This test measures the sensitivity and function of your rectum and can provide useful information about your rectal sphincter and your ability to control stool passage. This test does not locate fistulas, but it can help in planning to repair the fistula.  .Other tests. If your doctor suspects you have inflammatory bowel disease, he or she may order a colonoscopy to view your colon. During the procedure, your doctor can take small samples of tissue (biopsy) for lab analysis, which can help confirm the diagnosis of Crohn's disease.  Sometimes fistulas heal on their own, but most people need surgery to close or repair the abnormal  connection. Before an operation can be done, the skin and other tissue around the fistula must be healthy, with no signs of infection or inflammation. Your doctor may advise a waiting period of three to six months before surgery to ensure the surrounding tissue is healthy and see if the fistula closes on its own.   Surgery to close a fistula may be done by a gynecologic or colorectal surgeon. The goal is to remove the fistula tract and close the opening by sewing together healthy tissue. Surgical options include:  .Sewing an anal fistula plug or patch of biologic tissue into the fistula to allow your tissue to grow into the patch and heal the fistula.  .Using a tissue graft taken from a nearby part of your body or folding a flap of healthy tissue over the fistula opening.  .Doing more complicated surgical repair if the anal sphincter muscles also are damaged or if there's scarring or tissue damage from radiation or Crohn's disease.  Marland KitchenPerforming a colostomy before repairing a fistula in more complex or recurrent cases to divert stool through an opening in your abdomen instead of through your rectum. This may be needed if you've had tissue damage or scarring from previous surgery or radiation treatment, an ongoing infection or significant fecal contamination, a cancerous tumor, or an  abscess. If a colostomy is needed, your surgeon may wait eight to 12 weeks before repairing the fistula. Usually after about three to six months and confirmation that your fistula has healed, the colostomy can be reversed and normal bowel function is restored.    Good hygiene can help ease discomfort and reduce the chance of vaginal or urinary tract infections while waiting for repair.  Reyes Ivan with water. Gently wash your outer genital area with warm water each time you experience vaginal discharge or passage of stool. A shower is a good option.  Marland KitchenAvoid irritants. Soap can dry and irritate your skin, but a gentle unscented soap  may be necessary in moderation. Avoid harsh or scented soap and scented tampons and pads. Vaginal douches can increase your chance of infection.  .Dry thoroughly. Allow the area to air-dry after washing, or gently pat the area dry with toilet paper or a clean washcloth.  .Avoid rubbing with dry toilet paper. Pre-moistened, alcohol-free, unscented towelettes or wipes or moistened cotton balls may be a good alternative for cleaning the area.  .Use a cold compress. Apply a cold compress, such as a washcloth, to the folds at the opening of the vagina (labia).  Marland KitchenApply a cream or powder. Moisture-barrier creams help keep irritated skin from having direct contact with liquid or stool. Nonmedicated talcum powder or cornstarch also may help relieve discomfort. Ask your doctor to recommend a product. Be sure the area is clean and dry before you apply any cream or powder.  .Wear cotton underwear and loose clothing. Tight clothing can restrict airflow, making skin problems worse. Change soiled underwear quickly. Products such as absorbent pads, disposable underwear or adult diapers can help if you're passing liquid or stool, but be sure they have an absorbent wicking layer on top.   GETTING TO GOOD BOWEL HEALTH. Irregular bowel habits such as constipation and diarrhea can lead to many problems over time.  Having one soft bowel movement a day is the most important way to prevent further problems.  The anorectal canal is designed to handle stretching and feces to safely manage our ability to get rid of solid waste (feces, poop, stool) out of our body.  BUT, hard constipated stools can act like ripping concrete bricks and diarrhea can be a burning fire to this very sensitive area of our body, causing inflamed hemorrhoids, anal fissures, increasing risk is perirectal abscesses, abdominal pain/bloating, an making irritable bowel worse.     The goal: ONE SOFT BOWEL MOVEMENT A DAY!  To have soft, regular bowel movements:     Drink at least 8 tall glasses of water a day.     Take plenty of fiber.  Fiber is the undigested part of plant food that passes into the colon, acting s "natures broom" to encourage bowel motility and movement.  Fiber can absorb and hold large amounts of water. This results in a larger, bulkier stool, which is soft and easier to pass. Work gradually over several weeks up to 6 servings a day of fiber (25g a day even more if needed) in the form of: o Vegetables -- Root (potatoes, carrots, turnips), leafy green (lettuce, salad greens, celery, spinach), or cooked high residue (cabbage, broccoli, etc) o Fruit -- Fresh (unpeeled skin & pulp), Dried (prunes, apricots, cherries, etc ),  or stewed ( applesauce)  o Whole grain breads, pasta, etc (whole wheat)  o Bran cereals    Bulking Agents -- This type of water-retaining fiber generally is easily obtained each  day by one of the following:  o Psyllium bran -- The psyllium plant is remarkable because its ground seeds can retain so much water. This product is available as Metamucil, Konsyl, Effersyllium, Per Diem Fiber, or the less expensive generic preparation in drug and health food stores. Although labeled a laxative, it really is not a laxative.  o Methylcellulose -- This is another fiber derived from wood which also retains water. It is available as Citrucel. o Polyethylene Glycol - and "artificial" fiber commonly called Miralax or Glycolax.  It is helpful for people with gassy or bloated feelings with regular fiber o Flax Seed - a less gassy fiber than psyllium   No reading or other relaxing activity while on the toilet. If bowel movements take longer than 5 minutes, you are too constipated   AVOID CONSTIPATION.  High fiber and water intake usually takes care of this.  Sometimes a laxative is needed to stimulate more frequent bowel movements, but    Laxatives are not a good long-term solution as it can wear the colon out. o Osmotics (Milk of Magnesia,  Fleets phosphosoda, Magnesium citrate, MiraLax, GoLytely) are safer than  o Stimulants (Senokot, Castor Oil, Dulcolax, Ex Lax)    o Do not take laxatives for more than 7days in a row.    IF SEVERELY CONSTIPATED, try a Bowel Retraining Program: o Do not use laxatives.  o Eat a diet high in roughage, such as bran cereals and leafy vegetables.  o Drink six (6) ounces of prune or apricot juice each morning.  o Eat two (2) large servings of stewed fruit each day.  o Take one (1) heaping tablespoon of a psyllium-based bulking agent twice a day. Use sugar-free sweetener when possible to avoid excessive calories.  o Eat a normal breakfast.  o Set aside 15 minutes after breakfast to sit on the toilet, but do not strain to have a bowel movement.  o If you do not have a bowel movement by the third day, use an enema and repeat the above steps.    Controlling diarrhea o Switch to liquids and simpler foods for a few days to avoid stressing your intestines further. o Avoid dairy products (especially milk & ice cream) for a short time.  The intestines often can lose the ability to digest lactose when stressed. o Avoid foods that cause gassiness or bloating.  Typical foods include beans and other legumes, cabbage, broccoli, and dairy foods.  Every person has some sensitivity to other foods, so listen to our body and avoid those foods that trigger problems for you. o Adding fiber (Citrucel, Metamucil, psyllium, Miralax) gradually can help thicken stools by absorbing excess fluid and retrain the intestines to act more normally.  Slowly increase the dose over a few weeks.  Too much fiber too soon can backfire and cause cramping & bloating. o Probiotics (such as active yogurt, Align, etc) may help repopulate the intestines and colon with normal bacteria and calm down a sensitive digestive tract.  Most studies show it to be of mild help, though, and such products can be costly. o Medicines:   Bismuth subsalicylate (ex.  Kayopectate, Pepto Bismol) every 30 minutes for up to 6 doses can help control diarrhea.  Avoid if pregnant.   Loperamide (Immodium) can slow down diarrhea.  Start with two tablets (4mg  total) first and then try one tablet every 6 hours.  Avoid if you are having fevers or severe pain.  If you are not better or  start feeling worse, stop all medicines and call your doctor for advice o Call your doctor if you are getting worse or not better.  Sometimes further testing (cultures, endoscopy, X-ray studies, bloodwork, etc) may be needed to help diagnose and treat the cause of the diarrhea. o

## 2013-03-21 NOTE — Progress Notes (Addendum)
Subjective:     Patient ID: [name] [name], female   DOB: Jul 10, 1960, 52 y.o.   MRN: [id number]  HPI  TU SHIMMEL  Dec 12, 1960 [id number]  Patient Care Team: Merlyn Albert, MD as PCP - General (Family Medicine) Kathlen Brunswick, MD as Attending Physician (Cardiology) Malissa Hippo, MD as Consulting Physician (Gastroenterology) Melony Overly, MD as Consulting Physician (Obstetrics and Gynecology)  This patient is a 52 y.o.female who presents today for surgical evaluation at the request of Dr. Edward Jolly.   Reason for visit: Rectovaginal fistula  Pleasant female that has been struggling with drainage out her vagina for some time.  Has been going on for the past three years.  Moderate volume of stool coming out of the vagina.  It happens about once a month.  She usually has mild constipation with a bowel movement every other day.  No purulence.  No blood.  It is not coming out the anus.  Has noticed some mild soiling but no severe fecal incontinence.  Mild incontinence to flatus.  No difficulty with urinary incontinence.  No history of urinary tract infections.    She has had for natural vaginal deliveries with one spontaneous episiotomy - repaired.  No surgical episiotomy.  She has never had hemorrhoid problems or fissures.  No history of diverticulitis.  No history of prior colon problems.  No personal nor family history of GI/colon cancer, inflammatory bowel disease, irritable bowel syndrome, allergy such as Celiac Sprue, dietary/dairy problems, colitis, ulcers nor gastritis.  No recent sick contacts/gastroenteritis.  No travel outside the country.  No changes in diet.  She did have an episode of mild proctitis on colonoscopy two years ago.  Biopsies negative for inflammatory bowel disease.    Patient Active Problem List   Diagnosis Date Noted  . Obesity (BMI 30-39.9) 12/14/2012  . GERD (gastroesophageal reflux disease) 12/14/2012  . Chronic depression 12/14/2012  . Heart murmur,  systolic 09/02/2012  . Essential hypertension 09/02/2012  . Laboratory test 06/28/2012  . Chest pain   . Palpitations   . DEGENERATIVE JOINT DISEASE, RIGHT KNEE 10/03/2009  . DISC DEGENERATION 10/24/2007    Past Medical History  Diagnosis Date  . Chest pain 2009    Consultation-Rothbart, negative chest CT; nl echo in 2005; h/o palpitations  . Palpitations   . Degenerative joint disease     + degenerative joint disease of the lumbosacral spine  . Colitis 2010    not IBD  . Depression   . Herpes simplex type II infection   . Allergic rhinitis   . Back pain   . Fibroid   . Heart murmur     Past Surgical History  Procedure Laterality Date  . Colonoscopy  10/2010    proctitis; melanosis coli  . Tubal ligation    . Breast excisional biopsy  04/2003    Left; benign disease  . Right oophorectomy  03/13/2007  . Abdominal hysterectomy  03/13/2007    TAH ?BSO--Dr Cheron Every, [place]    History   Social History  . Marital Status: Married    Spouse Name: N/A    Number of Children: 4  . Years of Education: N/A   Occupational History  . Factory work [employer]   Social History Main Topics  . Smoking status: Former Smoker -- 0.50 packs/day for 15 years    Types: Cigarettes    Quit date: 07/06/1982  . Smokeless tobacco: Never Used  . Alcohol Use: No  .  Drug Use: No  . Sexual Activity: Yes    Partners: Male    Birth Control/ Protection: Surgical     Comment: TAH   Other Topics Concern  . Not on file   Social History Narrative  . No narrative on file    Family History  Problem Relation Age of Onset  . Aneurysm Mother     Cerebral  . Hypertension Mother   . Hyperlipidemia Mother   . Stroke Mother   . Coronary artery disease Father   . Hypertension Father   . Hyperlipidemia Father   . Lung cancer Brother   . Cancer Brother     Lung    Current Outpatient Prescriptions  Medication Sig Dispense Refill  . atenolol (TENORMIN) 50 MG tablet TAKE 1 TABLET BY  MOUTH EVERY DAY FOR PALPITATION  90 tablet  1  . citalopram (CELEXA) 40 MG tablet TAKE 1 TABLET BY MOUTH EVERY DAY  90 tablet  1  . phentermine (ADIPEX-P) 37.5 MG tablet Take 1 tablet (37.5 mg total) by mouth daily before breakfast.  30 tablet  2  . PREMARIN vaginal cream Place 0.5 g vaginally as needed.       . valACYclovir (VALTREX) 1000 MG tablet TAKE 1 TABLET BY MOUTH DAILY  30 tablet  5   No current facility-administered medications for this visit.     Allergies  Allergen Reactions  . Doxycycline Other (See Comments)    Unknown    BP 100/78  Pulse 69  Temp(Src) 97 F (36.1 C) (Temporal)  Ht 5\' 1"  (1.549 m)  Wt 160 lb 12.8 oz (72.938 kg)  BMI 30.4 kg/m2  SpO2 98%  LMP 12/04/2008  Mm Digital Screening  03/14/2013   CLINICAL DATA:  Screening.  EXAM: DIGITAL SCREENING BILATERAL MAMMOGRAM WITH CAD  COMPARISON:  Previous exam(s)  ACR Breast Density Category b: There is a scattered fibroglandular pattern.  FINDINGS: In the left breast, a possible mass warrants further evaluation with spot compression views and possibly ultrasound. In the right breast, no masses or malignant type calcifications are identified. Images were processed with CAD.  IMPRESSION: Further evaluation is suggested for possible mass in the left breast.  RECOMMENDATION: Diagnostic mammogram and possibly ultrasound of the left breast. (Code:FI-L-12M)  The patient will be contacted regarding the findings, and additional imaging will be scheduled.  BI-RADS CATEGORY  0: Incomplete. Need additional imaging evaluation and/or prior mammograms for comparison.   Electronically Signed   By: Gordan Payment   On: 03/14/2013 11:08     Review of Systems  Constitutional: Negative for fever, chills, diaphoresis, appetite change and fatigue.  HENT: Negative for ear pain, sore throat, trouble swallowing, neck pain and ear discharge.   Eyes: Negative for photophobia, discharge and visual disturbance.  Respiratory: Negative for cough,  choking, chest tightness and shortness of breath.   Cardiovascular: Positive for palpitations. Negative for chest pain and leg swelling.  Gastrointestinal: Negative for nausea, vomiting, abdominal pain, diarrhea, constipation, blood in stool, abdominal distention, anal bleeding and rectal pain.  Endocrine: Negative for cold intolerance and heat intolerance.  Genitourinary: Positive for vaginal discharge. Negative for dysuria, urgency, frequency, flank pain, vaginal bleeding, difficulty urinating, vaginal pain, menstrual problem and pelvic pain.  Musculoskeletal: Negative for myalgias and gait problem.  Skin: Negative for color change, pallor and rash.  Allergic/Immunologic: Negative for environmental allergies, food allergies and immunocompromised state.  Neurological: Negative for dizziness, speech difficulty, weakness and numbness.  Hematological: Negative for adenopathy.  Psychiatric/Behavioral: Negative  for confusion and agitation. The patient is not nervous/anxious.        Objective:   Physical Exam  Constitutional: She is oriented to person, place, and time. She appears well-developed and well-nourished. No distress.  HENT:  Head: Normocephalic.  Mouth/Throat: Oropharynx is clear and moist. No oropharyngeal exudate.  Eyes: Conjunctivae and EOM are normal. Pupils are equal, round, and reactive to light. No scleral icterus.  Neck: Normal range of motion. Neck supple. No tracheal deviation present.  Cardiovascular: Normal rate, regular rhythm and intact distal pulses.   Pulmonary/Chest: Effort normal and breath sounds normal. No stridor. No respiratory distress. She exhibits no tenderness.  Abdominal: Soft. She exhibits no distension and no mass. There is no tenderness. Hernia confirmed negative in the right inguinal area and confirmed negative in the left inguinal area.  Genitourinary: No vaginal discharge found.    Exam done with assistance of female Medical Assistant in the room.   Perianal skin clean with good hygiene.  No pruritis.  No pilonidal disease.  No fissure.  No abscess/fistula.    No external skin tags / hemorrhoids of significance.  Tolerates digital and anoscopic rectal exam.  Normal sphincter tone.  No rectal masses.  Hemorrhoidal piles WNL.  No fistula opening   Musculoskeletal: Normal range of motion. She exhibits no tenderness.       Right elbow: She exhibits normal range of motion.       Left elbow: She exhibits normal range of motion.       Right wrist: She exhibits normal range of motion.       Left wrist: She exhibits normal range of motion.       Right hand: Normal strength noted.       Left hand: Normal strength noted.  Lymphadenopathy:       Head (right side): No posterior auricular adenopathy present.       Head (left side): No posterior auricular adenopathy present.    She has no cervical adenopathy.    She has no axillary adenopathy.       Right: No inguinal adenopathy present.       Left: No inguinal adenopathy present.  Neurological: She is alert and oriented to person, place, and time. No cranial nerve deficit. She exhibits normal muscle tone. Coordination normal.  Skin: Skin is warm and dry. No rash noted. She is not diaphoretic. No erythema.  Psychiatric: She has a normal mood and affect. Her behavior is normal. Judgment and thought content normal.       Assessment:     Intermittent feculent drainage per vagina suspicious for rectovaginal fistula.  Proximal location most likely     Plan:     I reviewed her numerous studies and prior notes from other consultants.  I had a long discussion with the patient.  Discussed with the colorectal partner, Dr. Romie Levee.  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  I suspect it is more proximal at the vaginal cuff.  I need evidence objectively of this.  It is always a challenge to do it endoscopically/by physical exam.  I discussed with the radiologist, Dr. Audie Pinto.  I  think a water-soluble contrast in the rectum with a CT scan would give [place] the best 3-D picture of this area over a regular simple barium enema.  If that fails, consider trying to set up a methylene blue enema was clean tampon in the vagina to see if there is any spillage.  Backup  plan OR examination under anesthesia with rigid proctoscopy.   If such a fistula is confirmed, then the patient would benefit from low anterior rectosigmoid resection, vaginal repair, omental patching of vaginal repair.  This would be the best technique to excise the fistula and prevent recurrence:  The anatomy & physiology of the digestive tract was discussed.  The pathophysiology of rectovaginal fistula was discussed.  Natural history risks without surgery was discussed. I worked to give an overview of the disease and the frequent need to have multispecialty involvement.   I feel the risks of no intervention will lead to serious problems that outweigh the operative risks; therefore, I recommended surgery to treat the pathology.  Perineal approach with advancement flap for low fistulas was discussed.   Laparoscopic & open techniques for possible partial proctocolectomy for higher rectovaginal fistulas were discussed.  Repair of the vaginal opening with possible need for intervening material to prevent recurrence such as omentum, muscle, mesh, etc was discussed.  Possible fecal diversion by ostomy was discussed.  We will work to preserve anal & pelvic floor function without sacrificing cure.  Risks such as bleeding, infection, abscess, leak, recurrence with reoperation, possible ostomy, hernia, heart attack, death, and other risks were discussed.  I noted a good likelihood this will help address the problem.   Goals of post-operative recovery were discussed as well.  We will work to minimize complications.  An educational handout on the pathology was given as well.  Questions were answered.    The patient expresses  understanding.

## 2013-03-22 ENCOUNTER — Ambulatory Visit (INDEPENDENT_AMBULATORY_CARE_PROVIDER_SITE_OTHER): Payer: BC Managed Care – PPO | Admitting: Nurse Practitioner

## 2013-03-22 ENCOUNTER — Encounter: Payer: Self-pay | Admitting: Nurse Practitioner

## 2013-03-22 VITALS — BP 114/72 | Ht 61.0 in | Wt 162.2 lb

## 2013-03-22 DIAGNOSIS — N6320 Unspecified lump in the left breast, unspecified quadrant: Secondary | ICD-10-CM

## 2013-03-22 DIAGNOSIS — N63 Unspecified lump in unspecified breast: Secondary | ICD-10-CM

## 2013-03-22 MED ORDER — PHENTERMINE HCL 37.5 MG PO TABS: 37.5000 mg | ORAL_TABLET | Freq: Every day | ORAL | Status: DC

## 2013-03-22 NOTE — Progress Notes (Signed)
Subjective:  Has noticed a lump in her left breast x 2 weeks. Minimally tender. Also noticed on mammogram on 03/08/13. No FMH of breast cancer. Has diagnostic mammo scheduled.  Also would like to continue Adipex. Has done well with weight loss. Denies any adverse effects.  Objective:   BP 114/72  Ht 5\' 1"  (1.549 m)  Wt 162 lb 3.2 oz (73.573 kg)  BMI 30.66 kg/m2  LMP 12/04/2008 NAD. Alert, oriented. Lungs clear. Heart RRR. Left breast: dense tissue with fine nodularity. Firm mobile nodule noted outer part of breast at approx. 1 o'clock. Nontender. No lymphadenopathy noted. Has lost approx 14 lbs since her visit in June.  Assessment: Breast mass, left  Morbid obesity  Plan:  Meds ordered this encounter  Medications  . Celecoxib (CELEBREX PO)    Sig: Take by mouth daily.  . phentermine (ADIPEX-P) 37.5 MG tablet    Sig: Take 1 tablet (37.5 mg total) by mouth daily before breakfast.    Dispense:  30 tablet    Refill:  2    Order Specific Question:  Supervising Provider    Answer:  Riccardo Dubin   Will need to stop Adipex after this RX is complete. Follow-up for diagnostic mammo as planned.

## 2013-03-23 ENCOUNTER — Ambulatory Visit (HOSPITAL_COMMUNITY): Payer: BC Managed Care – PPO

## 2013-03-27 ENCOUNTER — Ambulatory Visit (HOSPITAL_COMMUNITY)
Admission: RE | Admit: 2013-03-27 | Discharge: 2013-03-27 | Disposition: A | Discharge status: Home / Self Care | Payer: BC Managed Care – PPO | Source: Ambulatory Visit | Attending: Surgery | Admitting: Surgery

## 2013-03-27 DIAGNOSIS — N898 Other specified noninflammatory disorders of vagina: Secondary | ICD-10-CM | POA: Insufficient documentation

## 2013-03-27 DIAGNOSIS — N823 Fistula of vagina to large intestine: Secondary | ICD-10-CM

## 2013-03-27 MED ORDER — IOHEXOL 300 MG/ML  SOLN: 80.0000 mL | Freq: Once | INTRAMUSCULAR | Status: AC | PRN

## 2013-03-28 ENCOUNTER — Other Ambulatory Visit: Payer: BC Managed Care – PPO

## 2013-03-29 ENCOUNTER — Other Ambulatory Visit: Payer: Self-pay | Admitting: Family Medicine

## 2013-03-29 ENCOUNTER — Ambulatory Visit (HOSPITAL_COMMUNITY)
Admission: RE | Admit: 2013-03-29 | Discharge: 2013-03-29 | Disposition: A | Discharge status: Home / Self Care | Payer: BC Managed Care – PPO | Source: Ambulatory Visit | Attending: Family Medicine | Admitting: Family Medicine

## 2013-03-29 ENCOUNTER — Encounter (HOSPITAL_COMMUNITY): Payer: Self-pay

## 2013-03-29 DIAGNOSIS — C773 Secondary and unspecified malignant neoplasm of axilla and upper limb lymph nodes: Secondary | ICD-10-CM | POA: Insufficient documentation

## 2013-03-29 DIAGNOSIS — R928 Other abnormal and inconclusive findings on diagnostic imaging of breast: Secondary | ICD-10-CM

## 2013-03-29 DIAGNOSIS — N6320 Unspecified lump in the left breast, unspecified quadrant: Secondary | ICD-10-CM

## 2013-03-29 DIAGNOSIS — C50919 Malignant neoplasm of unspecified site of unspecified female breast: Secondary | ICD-10-CM | POA: Insufficient documentation

## 2013-03-29 DIAGNOSIS — R599 Enlarged lymph nodes, unspecified: Secondary | ICD-10-CM | POA: Insufficient documentation

## 2013-03-29 MED ORDER — LIDOCAINE HCL (PF) 2 % IJ SOLN
10.0000 mL | Freq: Once | INTRAMUSCULAR | Status: AC
  Administered 2013-03-29: 10 mL

## 2013-03-29 MED ORDER — LIDOCAINE HCL (PF) 2 % IJ SOLN
INTRAMUSCULAR | Status: AC
  Administered 2013-03-29: 10 mL
  Filled 2013-03-29: qty 10

## 2013-03-29 NOTE — Progress Notes (Signed)
Biopsy complete, no signs of distress.

## 2013-03-30 ENCOUNTER — Other Ambulatory Visit (HOSPITAL_COMMUNITY): Payer: BC Managed Care – PPO

## 2013-03-31 ENCOUNTER — Telehealth: Payer: Self-pay | Admitting: Family Medicine

## 2013-03-31 DIAGNOSIS — C50919 Malignant neoplasm of unspecified site of unspecified female breast: Secondary | ICD-10-CM | POA: Diagnosis present

## 2013-03-31 HISTORY — DX: Malignant neoplasm of unspecified site of unspecified female breast: C50.919

## 2013-03-31 NOTE — Telephone Encounter (Signed)
Patient is calling because she wants her biopsy results given to her TODAY. I explained to her this may not happen due to seeing patients. But she again told me she wanted them TODAY so she would not have to go through the weekend wondering. She said she was told that they would be given to her no later than today.

## 2013-03-31 NOTE — Telephone Encounter (Signed)
Spoke with [facility] and they stated it takes at least 7 days for pathology results from biopsy to come back and that the result were not back at this time. Patient notified.

## 2013-04-03 ENCOUNTER — Telehealth (INDEPENDENT_AMBULATORY_CARE_PROVIDER_SITE_OTHER): Payer: Self-pay

## 2013-04-03 ENCOUNTER — Other Ambulatory Visit: Payer: Self-pay | Admitting: Family Medicine

## 2013-04-03 DIAGNOSIS — C50919 Malignant neoplasm of unspecified site of unspecified female breast: Secondary | ICD-10-CM

## 2013-04-03 NOTE — Telephone Encounter (Signed)
Leigh calling to notify us that our pt just got diagnosed with breast cancer last week with positive lymph node. [facility] pt to the multidisciniplinary clinic on 10//8/14. Notified Dr Michaell Cowing and definitely the breast cancer needs to be dealt with first before the rectovaginal fistula. I will contact the pt.

## 2013-04-03 NOTE — Telephone Encounter (Signed)
Called pt to let her know that we were aware of the new diagnosis of breast cancer and Dr Michaell Cowing is notified of the new diagnosis as well. Per Dr Michaell Cowing the breast cancer will definitely come first for care instead of the rectovaginal fistula. I advised pt that we were here when she is ready to deal with the fistula but for now our stuff can be put on hold until she calls [place] back after getting the breast cancer taken care of. The pt understands and will call us when she is ready to deal with the rectovaginal fistula.

## 2013-04-06 ENCOUNTER — Telehealth: Payer: Self-pay | Admitting: *Deleted

## 2013-04-06 DIAGNOSIS — Z170 Estrogen receptor positive status [ER+]: Secondary | ICD-10-CM | POA: Insufficient documentation

## 2013-04-06 DIAGNOSIS — C50412 Malignant neoplasm of upper-outer quadrant of left female breast: Secondary | ICD-10-CM

## 2013-04-06 NOTE — Telephone Encounter (Signed)
Confirmed [facility] for 04/12/13 at 0800.  Instructions and contact information given. 

## 2013-04-11 ENCOUNTER — Ambulatory Visit
Admission: RE | Admit: 2013-04-11 | Discharge: 2013-04-11 | Disposition: A | Discharge status: Home / Self Care | Payer: BC Managed Care – PPO | Source: Ambulatory Visit | Attending: Family Medicine | Admitting: Family Medicine

## 2013-04-11 DIAGNOSIS — C50919 Malignant neoplasm of unspecified site of unspecified female breast: Secondary | ICD-10-CM

## 2013-04-11 MED ORDER — GADOBENATE DIMEGLUMINE 529 MG/ML IV SOLN
15.0000 mL | Freq: Once | INTRAVENOUS | Status: AC | PRN
  Administered 2013-04-11: 15 mL via INTRAVENOUS

## 2013-04-12 ENCOUNTER — Encounter: Payer: Self-pay | Admitting: Oncology

## 2013-04-12 ENCOUNTER — Encounter: Payer: Self-pay | Admitting: *Deleted

## 2013-04-12 ENCOUNTER — Telehealth: Payer: Self-pay

## 2013-04-12 ENCOUNTER — Ambulatory Visit (HOSPITAL_BASED_OUTPATIENT_CLINIC_OR_DEPARTMENT_OTHER): Payer: BC Managed Care – PPO | Admitting: Oncology

## 2013-04-12 ENCOUNTER — Ambulatory Visit (HOSPITAL_BASED_OUTPATIENT_CLINIC_OR_DEPARTMENT_OTHER): Payer: BC Managed Care – PPO | Admitting: Surgery

## 2013-04-12 ENCOUNTER — Telehealth: Payer: Self-pay | Admitting: Oncology

## 2013-04-12 ENCOUNTER — Other Ambulatory Visit: Payer: Self-pay | Admitting: Family Medicine

## 2013-04-12 ENCOUNTER — Ambulatory Visit: Payer: BC Managed Care – PPO

## 2013-04-12 ENCOUNTER — Ambulatory Visit
Admission: RE | Admit: 2013-04-12 | Discharge: 2013-04-12 | Disposition: A | Discharge status: Home / Self Care | Payer: BC Managed Care – PPO | Source: Ambulatory Visit | Attending: Radiation Oncology | Admitting: Radiation Oncology

## 2013-04-12 ENCOUNTER — Telehealth (INDEPENDENT_AMBULATORY_CARE_PROVIDER_SITE_OTHER): Payer: Self-pay | Admitting: *Deleted

## 2013-04-12 ENCOUNTER — Other Ambulatory Visit (HOSPITAL_BASED_OUTPATIENT_CLINIC_OR_DEPARTMENT_OTHER): Payer: BC Managed Care – PPO | Admitting: Lab

## 2013-04-12 ENCOUNTER — Ambulatory Visit: Payer: BC Managed Care – PPO | Attending: Surgery | Admitting: Physical Therapy

## 2013-04-12 VITALS — BP 117/71 | HR 67 | Temp 98.4°F | Resp 19 | Ht 61.0 in | Wt 161.5 lb

## 2013-04-12 DIAGNOSIS — C50919 Malignant neoplasm of unspecified site of unspecified female breast: Secondary | ICD-10-CM | POA: Insufficient documentation

## 2013-04-12 DIAGNOSIS — C50412 Malignant neoplasm of upper-outer quadrant of left female breast: Secondary | ICD-10-CM

## 2013-04-12 DIAGNOSIS — R293 Abnormal posture: Secondary | ICD-10-CM | POA: Insufficient documentation

## 2013-04-12 DIAGNOSIS — C50419 Malignant neoplasm of upper-outer quadrant of unspecified female breast: Secondary | ICD-10-CM

## 2013-04-12 DIAGNOSIS — R928 Other abnormal and inconclusive findings on diagnostic imaging of breast: Secondary | ICD-10-CM

## 2013-04-12 DIAGNOSIS — C773 Secondary and unspecified malignant neoplasm of axilla and upper limb lymph nodes: Secondary | ICD-10-CM

## 2013-04-12 DIAGNOSIS — C50912 Malignant neoplasm of unspecified site of left female breast: Secondary | ICD-10-CM

## 2013-04-12 DIAGNOSIS — IMO0001 Reserved for inherently not codable concepts without codable children: Secondary | ICD-10-CM | POA: Insufficient documentation

## 2013-04-12 DIAGNOSIS — Z170 Estrogen receptor positive status [ER+]: Secondary | ICD-10-CM | POA: Insufficient documentation

## 2013-04-12 LAB — CBC WITH DIFFERENTIAL/PLATELET
BASO%: 0.7 % (ref 0.0–2.0)
Basophils Absolute: 0.1 10*3/uL (ref 0.0–0.1)
EOS%: 1.7 % (ref 0.0–7.0)
Eosinophils Absolute: 0.2 10*3/uL (ref 0.0–0.5)
HCT: 38.3 % (ref 34.8–46.6)
HGB: 12.6 g/dL (ref 11.6–15.9)
LYMPH%: 26.8 % (ref 14.0–49.7)
MCH: 27.4 pg (ref 25.1–34.0)
MCHC: 32.8 g/dL (ref 31.5–36.0)
MCV: 83.4 fL (ref 79.5–101.0)
MONO#: 0.7 10*3/uL (ref 0.1–0.9)
MONO%: 6.9 % (ref 0.0–14.0)
NEUT#: 6.3 10*3/uL (ref 1.5–6.5)
NEUT%: 63.9 % (ref 38.4–76.8)
Platelets: 255 10*3/uL (ref 145–400)
RBC: 4.58 10*6/uL (ref 3.70–5.45)
RDW: 14.9 % — ABNORMAL HIGH (ref 11.2–14.5)
WBC: 9.9 10*3/uL (ref 3.9–10.3)
lymph#: 2.6 10*3/uL (ref 0.9–3.3)

## 2013-04-12 LAB — COMPREHENSIVE METABOLIC PANEL (CC13)
ALT: 30 U/L (ref 0–55)
AST: 19 U/L (ref 5–34)
Albumin: 3.8 g/dL (ref 3.5–5.0)
Alkaline Phosphatase: 78 U/L (ref 40–150)
Anion Gap: 9 mEq/L (ref 3–11)
BUN: 7.1 mg/dL (ref 7.0–26.0)
CO2: 28 mEq/L (ref 22–29)
Calcium: 9.8 mg/dL (ref 8.4–10.4)
Chloride: 105 mEq/L (ref 98–109)
Creatinine: 0.8 mg/dL (ref 0.6–1.1)
Glucose: 95 mg/dl (ref 70–140)
Potassium: 3.9 mEq/L (ref 3.5–5.1)
Sodium: 143 mEq/L (ref 136–145)
Total Bilirubin: 0.4 mg/dL (ref 0.20–1.20)
Total Protein: 7.6 g/dL (ref 6.4–8.3)

## 2013-04-12 NOTE — Progress Notes (Unsigned)
[facility] Clinical Social Work Clinic Note Clinical Social Work met with Pt and her husband during breast clinic to offer support, discuss [facility] resources/programs, review distress screen and assess for other needs.  Pt appears and reports to have good support to assist her. Pt reports some concerns with anxiety and takes medication to assist her. She denied concerns with depression currently. CSW discussed other counseling resources and support groups and Pt may attend those as well. She scored a 5 on her distress screen, which indicates moderate distress. CSW discussed this with her further and other resources to assist. She is very open to additional support and is open to an Marketing executive and agrees for CSW to make this referral.   Clinical Social Work interventions: Supportive listening, problem solving, resource and referral.   Pt agrees to reach out to CSW as needed and is aware of how to contact support team.  Doreen Salvage, LCSW Clinical Social Worker [name] Wednesday, Thursday and Friday Phone: [phone] Fax: [phone]

## 2013-04-12 NOTE — Progress Notes (Addendum)
Re:   [name] KIRCHOFF DOB:   Aug 21, 1960 MRN:   [id number]  [facility]  ASSESSMENT AND PLAN: 1.  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  I discussed a multidisciplinary approach to the treatment of breast cancer, which includes medical oncology and radiation oncology.  I discussed the surgical options of lumpectomy vs. mastectomy.  If mastectomy, there is the possibility of reconstruction.   I discussed the options of lymph node biopsy.  The treatment plan depends on the pathologic staging of the tumor and the patient's personal wishes.  The risks of surgery include, but are not limited to, bleeding, infection, the need for further surgery, and nerve injury.  The final surgical decision of lumpectomy vs mastectomy is still up in the air.  If all the areas in her right breast are malignant, I still may be able to do a partial mastectomy (lumpectomy), but with a cosmetic deformity.  There is also the consideration that she could participate in one of two studies - NSABP B-51 or [facility] [phone].  Plan: 1) Biopsy of right breast x 2 this Friday, 10/9, 2) Power port placement [Scheduled 04/21/2013] , 3) Neoadjuvant chemotx (possible trial), 4)  Lumpectomy vs mastectomy, depending on path and response, 5) Axillary dissection vs. SLNBx depending on trial.  [Core biopsies 04/18/2013 - Inferior area shows high grade DCIS, Superior area shows fat necrosis.  Both thought to be concordant.  DN  04/19/2013]  2.  Palpitations  Sees Dr. Elvera Lennox. Rothbart 3.  Osteoarthritis of both hips 4.  On Celebrex  To stop this one week before surgery 5.  To stop Premarin  6.  Anxiety 7.  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 Michaell Cowing for this.  A CT scan 03/27/2013 showed no direct evidence of a rectovaginal fistula.  Dr. Michaell Cowing has suggested another test to try to "document" the  fistula.  That is on hold during for now as she goes through the treatment for breast cancer.  The patient will keep [place] informed about her symptoms.  REFERRING PHYSICIAN: Harlow Asa, MD  HISTORY OF PRESENT ILLNESS: [name] [name] is a 52 y.o. (DOB: 1960-09-06)  AA  female whose primary care physician is LUKING,W S, MD and [facility] for a new left breast cancer. Her husband is with her.  She gets a mammogram every year.  She felt a lump in the upper outer part of her left breast.  She has no family history of breast cancer.  She takes a Premarin on a PRN basis.  It sounds like she takes no more that a few tabs a month.  She will stop this.  She had a hysterectomy for fibroids in 2010.  She had a mammogram at [facility] on 03/13/2013 which found a left breast mass.  On 03/29/2013 she had a left breast biopsy.  The biopsy (Accession: [id number]) showed IDC and metastatic carcinoma to a left axillary lymph node. MRI 04/11/2013 shows - 3 abnormal enhancing masses in the upper-outer quadrant of left breast encompassing a total area of the 6.4 x 2.4 x 4 cm as described. The largest, superior and posterior mass was previously biopsied proven cancer. The 2 other smaller masses seen are suspicious for neoplasm.  She is for biopsy of these two other areas this Friday, 10/9.   Past Medical History  Diagnosis Date  .  Chest pain 2009    Consultation-Rothbart, negative chest CT; nl echo in 2005; h/o palpitations  . Palpitations   . Degenerative joint disease     + degenerative joint disease of the lumbosacral spine  . Colitis 2010    not IBD  . Depression   . Herpes simplex type II infection   . Allergic rhinitis   . Back pain   . Fibroid   . Heart murmur   . Breast cancer      Past Surgical History  Procedure Laterality Date  . Colonoscopy  10/2010    proctitis; melanosis coli  . Tubal ligation    . Breast excisional biopsy  04/2003    Left; benign disease  . Right oophorectomy   03/13/2007  . Abdominal hysterectomy  03/13/2007    TAH ?BSO--Dr Cheron Every, [place]      Current Outpatient Prescriptions  Medication Sig Dispense Refill  . atenolol (TENORMIN) 50 MG tablet TAKE 1 TABLET BY MOUTH EVERY DAY FOR PALPITATION  90 tablet  1  . Celecoxib (CELEBREX PO) Take by mouth daily.      . citalopram (CELEXA) 40 MG tablet TAKE 1 TABLET BY MOUTH EVERY DAY  90 tablet  1  . phentermine (ADIPEX-P) 37.5 MG tablet Take 1 tablet (37.5 mg total) by mouth daily before breakfast.  30 tablet  2  . PREMARIN vaginal cream Place 0.5 g vaginally as needed.       . valACYclovir (VALTREX) 1000 MG tablet TAKE 1 TABLET BY MOUTH DAILY  30 tablet  5   No current facility-administered medications for this visit.      Allergies  Allergen Reactions  . Doxycycline Other (See Comments)    Unknown    REVIEW OF SYSTEMS: Skin:  No history of rash.  No history of abnormal moles. Infection:  No history of hepatitis or HIV.  No history of MRSA. Neurologic:  No history of stroke.  No history of seizure.  No history of headaches. Cardiac:  Palpitations.  Has seen Dr. Dietrich Pates. Pulmonary:  Does not smoke cigarettes.  No asthma or bronchitis.  No OSA/CPAP.  Endocrine:  No diabetes. No thyroid disease. Gastrointestinal:  Has seen Dr. Michaell Cowing for possible colo-vesicle fistula, but CT scan was negative.  Had negative colonoscopy by Dr. Karilyn Cota in 11/03/2010.  For now, her fistula evaluation and treatment are on hold. Urologic:  No history of kidney stones.  No history of bladder infections. Musculoskeletal:  Osteoarthritis of both hips, on Celebrex.  She cannot remember the name of the orthopedists. Hematologic:  No bleeding disorder.  On Celebrex. Psycho-social:  The patient is oriented.   History of anxiety.  SOCIAL and FAMILY HISTORY: Married. Husband, Christiane Ha, with her. She has 4 children.  3 daughters and 1 son. Ages 48, 57, 85, 41. Works as a Passenger transport manager at [employer]  PHYSICAL  EXAM: LMP 12/04/2008  General: WN AA F who is alert and generally healthy appearing.  HEENT: Normal. Pupils equal. Neck: Supple. No mass.  No thyroid mass. Lymph Nodes:  No supraclavicular, cervical, or axillary nodes.  I paid particular attention to the left axilla where her biopsy was positive, but I did not feel a mass. Breasts:  Right - normal   Left - Fullness at 2 o'clock.  Not a discreet mass. Lungs: Clear to auscultation and symmetric breath sounds.  Heart:  RRR. No murmur or rub. Abdomen: Soft. No mass. No tenderness. No hernia. Normal bowel sounds.  Rectal: Not done. Extremities:  Good strength and ROM  in upper and lower extremities. Neurologic:  Grossly intact to motor and sensory function. Psychiatric: Has normal mood and affect. Behavior is normal.   DATA REVIEWED: Epic notes.  Ovidio Kin, MD,  [facility] [address]  [address]   [place], [place]    [address] Phone:  [phone] FAX:  [phone]

## 2013-04-12 NOTE — Progress Notes (Signed)
Checked in new pt with no financial concerns. °

## 2013-04-12 NOTE — Progress Notes (Signed)
Radiation Oncology         [phone]) [phone] ________________________________  Initial outpatient Consultation - Date: 04/12/2013   Name: [name] [name] MRN: [id number]   DOB: December 27, 1960  REFERRING PHYSICIAN: Kandis Cocking, MD  DIAGNOSIS: [id number]  HISTORY OF PRESENT ILLNESS::[name] [name] is a 52 y.o. female  Who underwent a screening mammogram. She was found to have a mass in the left breast. Ultrasound confirmed a 1.5 x 1.2 x 1 6 cm mass. Multiple abnormal appearing lymph nodes were also noted. MRI showed 2 additional masses anterior and inferior to the mass seen on the mammogram. In total the masses measured 6.4 x 2.4 x 4.0 cm. 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 Lymph node was also biopsied which was positive. Ultrasound guided biopsy of the other masses seen on MRI is scheduled. Neoadjuvant chemotherapy has been recommended. She lives in [place]. She is G6 P4 with her first live birth 52. Menarche at 59. She is postmenopausal. She has never been on hormone replacement.  PREVIOUS RADIATION THERAPY: No  PAST MEDICAL HISTORY:  has a past medical history of Chest pain (2009); Palpitations; Degenerative joint disease; Colitis (2010); Depression; Herpes simplex type II infection; Allergic rhinitis; Back pain; Fibroid; Heart murmur; and Breast cancer.    PAST SURGICAL HISTORY: Past Surgical History  Procedure Laterality Date  . Colonoscopy  10/2010    proctitis; melanosis coli  . Tubal ligation    . Breast excisional biopsy  04/2003    Left; benign disease  . Right oophorectomy  03/13/2007  . Abdominal hysterectomy  03/13/2007    TAH ?BSO--Dr Cheron Every, [place]    FAMILY HISTORY:  Family History  Problem Relation Age of Onset  . Aneurysm Mother     Cerebral  . Hypertension Mother   . Hyperlipidemia Mother   . Stroke Mother   . Coronary artery disease  Father   . Hypertension Father   . Hyperlipidemia Father   . Lung cancer Brother   . Cancer Brother     Lung    SOCIAL HISTORY:  History  Substance Use Topics  . Smoking status: Former Smoker -- 0.50 packs/day for 15 years    Types: Cigarettes    Quit date: 07/06/1982  . Smokeless tobacco: Never Used  . Alcohol Use: No    ALLERGIES: Doxycycline  MEDICATIONS:  Current Outpatient Prescriptions  Medication Sig Dispense Refill  . atenolol (TENORMIN) 50 MG tablet TAKE 1 TABLET BY MOUTH EVERY DAY FOR PALPITATION  90 tablet  1  . Celecoxib (CELEBREX PO) Take by mouth daily.      . citalopram (CELEXA) 40 MG tablet TAKE 1 TABLET BY MOUTH EVERY DAY  90 tablet  1  . phentermine (ADIPEX-P) 37.5 MG tablet Take 1 tablet (37.5 mg total) by mouth daily before breakfast.  30 tablet  2  . PREMARIN vaginal cream Place 0.5 g vaginally as needed.       . valACYclovir (VALTREX) 1000 MG tablet TAKE 1 TABLET BY MOUTH DAILY  30 tablet  5   No current facility-administered medications for this encounter.    REVIEW OF SYSTEMS:  A 15 point review of systems is documented in the electronic medical record. This was obtained by the nursing staff. However, I reviewed this with the patient to discuss relevant findings and make appropriate changes.  Pertinent items are noted in  HPI.  PHYSICAL EXAM:  She is a pleasant female in no distress sitting comfortably examining table. She has no lymphedema. She has good strength in her bilateral upper extremities. No palpable supra-particular or cervical lymph nodes. He has a palpable mass in the upper outer quadrant of the left breast. She has no palpable axillary lymph nodes. In particular not able to palpate any lymph nodes in the left axilla. ECoG performance status 0.  LABORATORY DATA:  Lab Results  Component Value Date   WBC 9.9 04/12/2013   HGB 12.6 04/12/2013   HCT 38.3 04/12/2013   MCV 83.4 04/12/2013   PLT 255 04/12/2013   Lab Results  Component Value Date     NA 143 04/12/2013   K 3.9 04/12/2013   CL 104 06/06/2008   CO2 28 04/12/2013   Lab Results  Component Value Date   ALT 30 04/12/2013   AST 19 04/12/2013   ALKPHOS 78 04/12/2013   BILITOT 0.40 04/12/2013     RADIOGRAPHY: Ct Pelvis W Contrast  03/27/2013   CLINICAL DATA:  Evaluate for rectovaginal fistula. Three years of drainage from vagina.  EXAM: CT PELVIS WITH CONTRAST  TECHNIQUE: Multidetector CT imaging of the pelvis was performed using the standard protocol following the bolus administration of intravenous contrast.  CONTRAST:  100 cc Omnipaque 300  COMPARISON:  Clinic note of 03/21/2013. Prior abdominal pelvic CT of 04/25/2010.  FINDINGS: A rectal catheter. Moderate opacification of the rectum and sigmoid with contrast. No evidence of direct communication to the vagina. No extra colonic fluid collection identified. Normal imaged pelvic small bowel loops.  High pelvis partially excluded. No pelvic adenopathy. Normal urinary bladder, without gas within. Hysterectomy. Normal appearance of the vaginal cuff, without air or contrast identified within. No significant free fluid. No adnexal mass identified.  No acute osseous abnormality.  IMPRESSION: 1. No direct evidence of rectovaginal fistula. Moderate sigmoid and rectal contrast opacification without contrast identified within the vagina. 2. Prior hysterectomy, without acute pelvic process. 3. High pelvis partially excluded.   Electronically Signed   By: Jeronimo Greaves   On: 03/27/2013 17:35   US Breast Left  03/29/2013   *RADIOLOGY REPORT*  Clinical Data:  The patient was recalled from screening for possible left breast mass  DIGITAL DIAGNOSTIC LEFT MAMMOGRAM  AND LEFT BREAST ULTRASOUND:  Comparison:  Priors including 03/13/2013  Findings:  ACR Breast Density Category a:  The breast tissue is almost entirely fatty.  Within the upper-outer quadrant of the left breast there is a 2.5 cm irregular mass which persists on additional spot compression views.   This will be further evaluated with ultrasound.  On physical exam, I palpate a small firm mass within the upper- outer quadrant of the left breast.  Ultrasound is performed, showing a 1.5 x 1.2 x 1.6 cm irregular hypoechoic mass within the left breast 2 o'clock position 8 cm from the nipple.  Additionally there are two adjacent enlarged left axillary lymph nodes with the largest measuring up to 2.5 cm.  IMPRESSION: Suspicious left breast mass with enlarged left axillary lymph nodes. Findings are worrisome for primary breast malignancy and metastatic adenopathy.  Further evaluation with ultrasound-guided core needle biopsy is recommended.  This will be performed today 03/29/2013.  RECOMMENDATION: Ultrasound-guided core needle biopsy of left breast mass and left axillary lymph node.  I have discussed the findings and recommendations with the patient. Results were also provided in writing at the conclusion of the visit.  If applicable, a reminder  letter will be sent to the patient regarding the next appointment.  BI-RADS CATEGORY 4:  Suspicious abnormality - biopsy should be considered.   Original Report Authenticated By: Annia Belt, M.D   Mr Breast Bilateral W Wo Contrast  04/11/2013   CLINICAL DATA:  Recent diagnosis of left breast cancer with metastatic left axillary lymph node  EXAM: MR BILATERAL BREAST WITHOUT AND WITH CONTRAST  LABS:  none  TECHNIQUE: Multiplanar, multisequence MR images of both breasts were obtained prior to and following the intravenous administration of 15ml of MultiHance.  THREE-DIMENSIONAL MR IMAGE RENDERING ON INDEPENDENT WORKSTATION:  Three-dimensional MR images were rendered by post-processing of the original MR data on an independent workstation. The three-dimensional MR images were interpreted, and findings are reported in the following complete MRI report for this study.  COMPARISON:  Previous exams  FINDINGS: Breast composition: b:  Scattered fibroglandular tissue  Background  parenchymal enhancement: Mild  Right breast: No mass or abnormal enhancement.  Left breast: In the upper outer quadrant left breast, there are 3 abnormal enhancing masses encompassing an area of 6.4 cm anterior posterior dimension, 2.4 cm in diameter, 4.0 cm craniocaudal dimension. The largest mass is the most superior and posterior and was the mass previously biopsy with a clip artifact present. This mass measures 2.7cm AP, 1.4 cm cc, 1.8 cm diameter and shows washout kinetics. The 2nd mass is inferior to the biopsy mass showing washout kinetics. This mass measures 1.6 cm AP, 2.1 cm cc, 1 cm diameter. The third mass is the most anterior and smallest mass just beneath the skin. This mass measures 1.3 cm AP, 0.5 cm diameter, 0.8 cm CC showing washout kinetics.  Lymph nodes: There are abnormal enlarged lymph nodes in the left axilla consistent with patient's known left metastatic axillary lymph node.  Ancillary findings:  None.  IMPRESSION: There are 3 abnormal enhancing masses in the upper-outer quadrant of left breast encompassing a total area of the 6.4 x 2.4 x 4 cm as described. The largest, superior and posterior mass was previously biopsied proven cancer. The 2 other smaller masses seen are suspicious for neoplasm.  RECOMMENDATION: A second-look ultrasound with biopsy of the 2 smaller masses not previously biopsied in the upper outer quadrant left breast. If the masses cannot be seen on ultrasound, then MR guided core biopsy is recommended if breast conservation is desired.   Electronically Signed   By: Sherian Rein M.D.   On: 04/11/2013 11:35   Mm Digital Diagnostic Unilat L  03/29/2013   *RADIOLOGY REPORT*  Clinical Data:  The patient was recalled from screening for possible left breast mass  DIGITAL DIAGNOSTIC LEFT MAMMOGRAM  AND LEFT BREAST ULTRASOUND:  Comparison:  Priors including 03/13/2013  Findings:  ACR Breast Density Category a:  The breast tissue is almost entirely fatty.  Within the upper-outer  quadrant of the left breast there is a 2.5 cm irregular mass which persists on additional spot compression views.  This will be further evaluated with ultrasound.  On physical exam, I palpate a small firm mass within the upper- outer quadrant of the left breast.  Ultrasound is performed, showing a 1.5 x 1.2 x 1.6 cm irregular hypoechoic mass within the left breast 2 o'clock position 8 cm from the nipple.  Additionally there are two adjacent enlarged left axillary lymph nodes with the largest measuring up to 2.5 cm.  IMPRESSION: Suspicious left breast mass with enlarged left axillary lymph nodes. Findings are worrisome for primary breast malignancy and metastatic  adenopathy.  Further evaluation with ultrasound-guided core needle biopsy is recommended.  This will be performed today 03/29/2013.  RECOMMENDATION: Ultrasound-guided core needle biopsy of left breast mass and left axillary lymph node.  I have discussed the findings and recommendations with the patient. Results were also provided in writing at the conclusion of the visit.  If applicable, a reminder letter will be sent to the patient regarding the next appointment.  BI-RADS CATEGORY 4:  Suspicious abnormality - biopsy should be considered.   Original Report Authenticated By: Annia Belt, M.D   [place] Lt Breast Bx W Loc Dev 1st Lesion Img Bx Spec US Guide  04/03/2013   **ADDENDUM** CREATED: 04/03/2013 11:22:16  I spoke with the patient by telephone on 03/31/2013 to discuss pathology results.  Pathology demonstrates  invasive ductal carcinoma and metastatic left axillary lymphadenopathy.  These results were also discussed with the referring physician's office.  The patient reports no problems at the biopsy site.  All questions were answered.  Recommendations: Surgical consultation is recommended and has been scheduled with the [facility] on 04/12/2013.  Additionally the patient will be scheduled for a breast MRI and contacted with an appointment.  **END ADDENDUM** SIGNED  BY: Antonieta Loveless, M.D  03/29/2013   *RADIOLOGY REPORT*  Clinical Data:  Patient with concerning left breast mass and enlarged left axillary lymph node.  ULTRASOUND GUIDED VACUUM ASSISTED CORE BIOPSY OF THE LEFT BREAST AND LEFT AXILLARY LYMPH NODE  Comparison: Previous exams.  I met with the patient and we discussed the procedure of ultrasound- guided biopsy, including benefits and alternatives.  We discussed the high likelihood of a successful procedure. We discussed the risks of the procedure including infection, bleeding, tissue injury, clip migration, and inadequate sampling.  Informed written consent was given. The usual time-out protocol was performed immediately prior to the procedure.  Using sterile technique and 2% Lidocaine as local anesthetic, under direct ultrasound visualization, a 12 gauge vacuum-assisted device was used to perform biopsy of irregular left breast mass using a medial approach. At the conclusion of the procedure, a wing shaped tissue marker clip was deployed into the biopsy cavity and determined to be in appropriate position by post procedure mammogram.  Using sterile technique and 2% Lidocaine as local anesthetic, under direct ultrasound visualization, a 12 gauge vacuum-assisted device was used to perform biopsy of enlarged left axillary lymph node using a medial approach. At the conclusion of the procedure, a ribbion shaped tissue marker clip was deployed into the biopsy cavity and determined to be in appropriate position by post procedure mammogram.  IMPRESSION: Ultrasound-guided biopsy of left breast mass and left axillary lymph node.  No apparent complications.   Original Report Authenticated By: Annia Belt, M.D   [place] Lt Plc Breast Loc Dev   Ea Add Lesion  Inc US Guide  04/03/2013   **ADDENDUM** CREATED: 04/03/2013 11:22:16  I spoke with the patient by telephone on 03/31/2013 to discuss pathology results.  Pathology demonstrates  invasive ductal carcinoma and metastatic left  axillary lymphadenopathy.  These results were also discussed with the referring physician's office.  The patient reports no problems at the biopsy site.  All questions were answered.  Recommendations: Surgical consultation is recommended and has been scheduled with the [facility] on 04/12/2013.  Additionally the patient will be scheduled for a breast MRI and contacted with an appointment.  **END ADDENDUM** SIGNED BY: Antonieta Loveless, M.D  03/29/2013   *RADIOLOGY REPORT*  Clinical Data:  Patient with concerning left breast mass  and enlarged left axillary lymph node.  ULTRASOUND GUIDED VACUUM ASSISTED CORE BIOPSY OF THE LEFT BREAST AND LEFT AXILLARY LYMPH NODE  Comparison: Previous exams.  I met with the patient and we discussed the procedure of ultrasound- guided biopsy, including benefits and alternatives.  We discussed the high likelihood of a successful procedure. We discussed the risks of the procedure including infection, bleeding, tissue injury, clip migration, and inadequate sampling.  Informed written consent was given. The usual time-out protocol was performed immediately prior to the procedure.  Using sterile technique and 2% Lidocaine as local anesthetic, under direct ultrasound visualization, a 12 gauge vacuum-assisted device was used to perform biopsy of irregular left breast mass using a medial approach. At the conclusion of the procedure, a wing shaped tissue marker clip was deployed into the biopsy cavity and determined to be in appropriate position by post procedure mammogram.  Using sterile technique and 2% Lidocaine as local anesthetic, under direct ultrasound visualization, a 12 gauge vacuum-assisted device was used to perform biopsy of enlarged left axillary lymph node using a medial approach. At the conclusion of the procedure, a ribbion shaped tissue marker clip was deployed into the biopsy cavity and determined to be in appropriate position by post procedure mammogram.  IMPRESSION: Ultrasound-guided  biopsy of left breast mass and left axillary lymph node.  No apparent complications.   Original Report Authenticated By: Annia Belt, M.D   IMPRESSION: T1cN1 left breast cancer  PLAN:We discussed the role of radiation and decreasing local failures in patients who undergo lumpectomy. If she ultimately has negative biopsies of these other area seen on MRI she would be a candidate for breast conservation. If she is found have more disease on her biopsy she would be a candidate for mastectomy. Either way she will require radiation because she is node positive. It is possible she undergoes chemotherapy she would be a candidate or one of our neoadjuvant sentinel lymph node studies. I will meet with her during chemotherapy to go and more detail. We talked in general terms today about the process of radiation and the placement tattoos. We discussed 6 weeks of treatment as an outpatient. We discussed skin redness and darkness. We discussed the use of breath hold technique for cardiac sparing. She met with surgery, medical oncology, physical therapy and a member of our patient family support staff. She may ultimately decide to have her care and eating. If that is the case she would not be helpful for study we refer her for standard radiation with Dr. Thersa Salt there. I spent 40 minutes  face to face with the patient and more than 50% of that time was spent in counseling and/or coordination of care.   ------------------------------------------------  Lurline Hare, MD

## 2013-04-12 NOTE — Progress Notes (Signed)
ID: Nita Sells OB: 02-23-1961  MR#: [id number]  NWG#:[id number]  PCP: Harlow Asa, MD GYN:   SU:  OTHER MD: Barron Alvine  CHIEF COMPLAINT: "I have a lump in my breast"  HISTORY OF PRESENT ILLNESS: Lakeisa palpated a mass in her left breast in late August 2014. She was already scheduled for screening mammography at any pen and this was performed 03/13/2013. Indeed a possible mass was noted in the left breast and on 03/29/2013 the patient underwent diagnostic left mammography and left ultrasonography. This showed a 2.5 cm irregular mass in the upper outer quadrant of the left breast. This was palpable.by ultrasound there was a 1.6 cm irregular hypoechoic mass in the area in question, and in addition to enlarged left axillary lymph nodes were noted, the largest measuring 2.5 cm.  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  Bilateral breast MRI 04/11/2013 shows 3 abnormal enhancing masses in the upper outer quadrant of the left breast measuring altogether 6.4 cm. The largest separate mass measured 2.7 cm. There were abnormal or large lymph nodes in the left axilla. Ultrasound guided biopsy of the 2 smaller masses has been scheduled.  The patient's subsequent history is as detailed below   INTERVAL HISTORY: Magon was seen in the multidisciplinary breast cancer clinic 04/12/2013 accompanied by her husband Christiane Ha   REVIEW OF SYSTEMS: Aside from the mass there were no specific symptoms leading to for mammography. 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 Estelle Grumbles. However CT scan of the pelvis 03/27/2013 showed no  direct evidence of a rectovaginal fistula. A detailed review of systems today was otherwise entirely negative   PAST MEDICAL HISTORY: Past Medical History  Diagnosis Date  . Chest pain 2009    Consultation-[facility], negative chest CT; nl echo in 2005; h/o palpitations  . Palpitations   . Degenerative joint disease     + degenerative joint disease of the lumbosacral spine  . Colitis 2010    not IBD  . Depression   . Herpes simplex type II infection   . Allergic rhinitis   . Back pain   . Fibroid   . Heart murmur   . Breast cancer     PAST SURGICAL HISTORY: Past Surgical History  Procedure Laterality Date  . Colonoscopy  10/2010    proctitis; melanosis coli  . Tubal ligation    . Breast excisional biopsy  04/2003    Left; benign disease  . Right oophorectomy  03/13/2007  . Abdominal hysterectomy  03/13/2007    TAH ?BSO--Dr Cheron Every, [place]    FAMILY HISTORY Family History  Problem Relation Age of Onset  . Aneurysm Mother     Cerebral  . Hypertension Mother   . Hyperlipidemia Mother   . Stroke Mother   . Coronary artery disease Father   . Hypertension Father   . Hyperlipidemia Father   . Lung cancer Brother   . Cancer Brother     Lung  The patient's father died at the age of 10 from a myocardial infarction. The patient's mother died at the age of 88 from a stroke. The patient  had 6 brothers, 2 sisters. One brother has a history of lung cancer in the setting of tobacco abuse. Another brother had multiple myeloma. There is no history of breast or ovarian cancer in the family  GYNECOLOGIC HISTORY:  Menarche age 33, first live birth age 83, the patient is GX P4. She status post abdominal hysterectomy with bilateral salpingo-oophorectomy. She never took hormone replacement.   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 Her husband [name] works in Designer, fashion/clothing. Daughter Candida Peeling lives at home with the patient. She is not  employed. Son Zanaria Morell lives in [place] and is not employed. Daughter [name] lives in [place] and is a Community education officer. Daughter [name] is a Child psychotherapist in [place]. The patient has 10 grandchildren. She attends the [employer]     ADVANCED DIRECTIVES: Not in place   HEALTH MAINTENANCE: History  Substance Use Topics  . Smoking status: Former Smoker -- 0.50 packs/day for 15 years    Types: Cigarettes    Quit date: 07/06/1982  . Smokeless tobacco: Never Used  . Alcohol Use: No     Colonoscopy:2012  PAP:2014   Bone density:  Lipid panel:  Allergies  Allergen Reactions  . Doxycycline Other (See Comments)    Unknown    Current Outpatient Prescriptions  Medication Sig Dispense Refill  . atenolol (TENORMIN) 50 MG tablet TAKE 1 TABLET BY MOUTH EVERY DAY FOR PALPITATION  90 tablet  1  . citalopram (CELEXA) 40 MG tablet TAKE 1 TABLET BY MOUTH EVERY DAY  90 tablet  1  . PREMARIN vaginal cream Place 0.5 g vaginally as needed.       . valACYclovir (VALTREX) 1000 MG tablet TAKE 1 TABLET BY MOUTH DAILY  30 tablet  5  . Celecoxib (CELEBREX PO) Take by mouth daily.      . phentermine (ADIPEX-P) 37.5 MG tablet Take 1 tablet (37.5 mg total) by mouth daily before breakfast.  30 tablet  2   No current facility-administered medications for this visit.    OBJECTIVE: Middle-aged [place] American woman who appears well  Filed Vitals:   04/12/13 0854  BP: 117/71  Pulse: 67  Temp: 98.4 F (36.9 C)  Resp: 19     Body mass index is 30.53 kg/(m^2).    ECOG FS:0 - Asymptomatic  Ocular: Sclerae unicteric, pupils equal, round and reactive to light Ear-nose-throat: Oropharynx clear, dentition fair Lymphatic: No cervical or supraclavicular adenopathy Lungs no rales or rhonchi, good excursion bilaterally Heart regular rate and rhythm, no murmur appreciated Abd soft, nontender, positive bowel sounds MSK no focal spinal tenderness, no joint  edema Neuro: non-focal, well-oriented, appropriate affect Breasts: The right breast is unremarkable. In the left breast, upper outer quadrant, it is possible to feel a hard edge of the mass, which is very hard to delineate in terms of size. There are no skin or nipple changes of concern. I do not palpate obvious left axillary adenopathy.   LAB RESULTS:  CMP     Component Value Date/Time   NA 143 04/12/2013 0839   NA 139 06/06/2008 0545   K 3.9 04/12/2013 0839   K 3.8 06/06/2008 0545   CL 104 06/06/2008 0545   CO2 28 04/12/2013 0839   CO2 26 06/06/2008 0545   GLUCOSE 95 04/12/2013 0839   GLUCOSE 112* 06/06/2008 0545   BUN 7.1 04/12/2013 0839   BUN 6 06/06/2008 0545  CREATININE 0.8 04/12/2013 0839   CREATININE 0.77 06/06/2008 0545   CALCIUM 9.8 04/12/2013 0839   CALCIUM 8.7 06/06/2008 0545   PROT 7.6 04/12/2013 0839   PROT 7.0 04/12/2008 1725   ALBUMIN 3.8 04/12/2013 0839   ALBUMIN 4.1 04/12/2008 1725   AST 19 04/12/2013 0839   AST 21 04/12/2008 1725   ALT 30 04/12/2013 0839   ALT 28 04/12/2008 1725   ALKPHOS 78 04/12/2013 0839   ALKPHOS 62 04/12/2008 1725   BILITOT 0.40 04/12/2013 0839   BILITOT 0.5 04/12/2008 1725   GFRNONAA >60 06/06/2008 0545   GFRAA  Value: >60        The eGFR has been calculated using the MDRD equation. This calculation has not been validated in all clinical 06/06/2008 0545    I No results found for this basename: SPEP, UPEP,  kappa and lambda light chains    Lab Results  Component Value Date   WBC 9.9 04/12/2013   NEUTROABS 6.3 04/12/2013   HGB 12.6 04/12/2013   HCT 38.3 04/12/2013   MCV 83.4 04/12/2013   PLT 255 04/12/2013      Chemistry      Component Value Date/Time   NA 143 04/12/2013 0839   NA 139 06/06/2008 0545   K 3.9 04/12/2013 0839   K 3.8 06/06/2008 0545   CL 104 06/06/2008 0545   CO2 28 04/12/2013 0839   CO2 26 06/06/2008 0545   BUN 7.1 04/12/2013 0839   BUN 6 06/06/2008 0545   CREATININE 0.8 04/12/2013 0839   CREATININE 0.77 06/06/2008 0545      Component  Value Date/Time   CALCIUM 9.8 04/12/2013 0839   CALCIUM 8.7 06/06/2008 0545   ALKPHOS 78 04/12/2013 0839   ALKPHOS 62 04/12/2008 1725   AST 19 04/12/2013 0839   AST 21 04/12/2008 1725   ALT 30 04/12/2013 0839   ALT 28 04/12/2008 1725   BILITOT 0.40 04/12/2013 0839   BILITOT 0.5 04/12/2008 1725       No results found for this basename: LABCA2    No components found with this basename: LABCA125    No results found for this basename: INR,  in the last 168 hours  Urinalysis    Component Value Date/Time   COLORURINE YELLOW 04/12/2008 1552   APPEARANCEUR CLEAR 04/12/2008 1552   LABSPEC 1.015 04/12/2008 1552   PHURINE 6.0 04/12/2008 1552   GLUCOSEU NEGATIVE 04/12/2008 1552   HGBUR NEGATIVE 04/12/2008 1552   BILIRUBINUR NEGATIVE 04/12/2008 1552   KETONESUR NEGATIVE 04/12/2008 1552   PROTEINUR NEGATIVE 04/12/2008 1552   UROBILINOGEN 0.2 04/12/2008 1552   NITRITE NEGATIVE 04/12/2008 1552   LEUKOCYTESUR NEGATIVE MICROSCOPIC NOT DONE ON URINES WITH NEGATIVE PROTEIN, BLOOD, LEUKOCYTES, NITRITE, OR GLUCOSE <1000 mg/dL. 04/12/2008 1552    STUDIES: Ct Pelvis W Contrast  03/27/2013   CLINICAL DATA:  Evaluate for rectovaginal fistula. Three years of drainage from vagina.  EXAM: CT PELVIS WITH CONTRAST  TECHNIQUE: Multidetector CT imaging of the pelvis was performed using the standard protocol following the bolus administration of intravenous contrast.  CONTRAST:  100 cc Omnipaque 300  COMPARISON:  Clinic note of 03/21/2013. Prior abdominal pelvic CT of 04/25/2010.  FINDINGS: A rectal catheter. Moderate opacification of the rectum and sigmoid with contrast. No evidence of direct communication to the vagina. No extra colonic fluid collection identified. Normal imaged pelvic small bowel loops.  High pelvis partially excluded. No pelvic adenopathy. Normal urinary bladder, without gas within. Hysterectomy. Normal appearance of the vaginal cuff,  without air or contrast identified within. No significant free fluid. No  adnexal mass identified.  No acute osseous abnormality.  IMPRESSION: 1. No direct evidence of rectovaginal fistula. Moderate sigmoid and rectal contrast opacification without contrast identified within the vagina. 2. Prior hysterectomy, without acute pelvic process. 3. High pelvis partially excluded.   Electronically Signed   By: Jeronimo Greaves   On: 03/27/2013 17:35   US Breast Left  03/29/2013   *RADIOLOGY REPORT*  Clinical Data:  The patient was recalled from screening for possible left breast mass  DIGITAL DIAGNOSTIC LEFT MAMMOGRAM  AND LEFT BREAST ULTRASOUND:  Comparison:  Priors including 03/13/2013  Findings:  ACR Breast Density Category a:  The breast tissue is almost entirely fatty.  Within the upper-outer quadrant of the left breast there is a 2.5 cm irregular mass which persists on additional spot compression views.  This will be further evaluated with ultrasound.  On physical exam, I palpate a small firm mass within the upper- outer quadrant of the left breast.  Ultrasound is performed, showing a 1.5 x 1.2 x 1.6 cm irregular hypoechoic mass within the left breast 2 o'clock position 8 cm from the nipple.  Additionally there are two adjacent enlarged left axillary lymph nodes with the largest measuring up to 2.5 cm.  IMPRESSION: Suspicious left breast mass with enlarged left axillary lymph nodes. Findings are worrisome for primary breast malignancy and metastatic adenopathy.  Further evaluation with ultrasound-guided core needle biopsy is recommended.  This will be performed today 03/29/2013.  RECOMMENDATION: Ultrasound-guided core needle biopsy of left breast mass and left axillary lymph node.  I have discussed the findings and recommendations with the patient. Results were also provided in writing at the conclusion of the visit.  If applicable, a reminder letter will be sent to the patient regarding the next appointment.  BI-RADS CATEGORY 4:  Suspicious abnormality - biopsy should be considered.    Original Report Authenticated By: Annia Belt, M.D   Mr Breast Bilateral W Wo Contrast  04/11/2013   CLINICAL DATA:  Recent diagnosis of left breast cancer with metastatic left axillary lymph node  EXAM: MR BILATERAL BREAST WITHOUT AND WITH CONTRAST  LABS:  none  TECHNIQUE: Multiplanar, multisequence MR images of both breasts were obtained prior to and following the intravenous administration of 15ml of MultiHance.  THREE-DIMENSIONAL MR IMAGE RENDERING ON INDEPENDENT WORKSTATION:  Three-dimensional MR images were rendered by post-processing of the original MR data on an independent workstation. The three-dimensional MR images were interpreted, and findings are reported in the following complete MRI report for this study.  COMPARISON:  Previous exams  FINDINGS: Breast composition: b:  Scattered fibroglandular tissue  Background parenchymal enhancement: Mild  Right breast: No mass or abnormal enhancement.  Left breast: In the upper outer quadrant left breast, there are 3 abnormal enhancing masses encompassing an area of 6.4 cm anterior posterior dimension, 2.4 cm in diameter, 4.0 cm craniocaudal dimension. The largest mass is the most superior and posterior and was the mass previously biopsy with a clip artifact present. This mass measures 2.7cm AP, 1.4 cm cc, 1.8 cm diameter and shows washout kinetics. The 2nd mass is inferior to the biopsy mass showing washout kinetics. This mass measures 1.6 cm AP, 2.1 cm cc, 1 cm diameter. The third mass is the most anterior and smallest mass just beneath the skin. This mass measures 1.3 cm AP, 0.5 cm diameter, 0.8 cm CC showing washout kinetics.  Lymph nodes: There are abnormal enlarged lymph nodes  in the left axilla consistent with patient's known left metastatic axillary lymph node.  Ancillary findings:  None.  IMPRESSION: There are 3 abnormal enhancing masses in the upper-outer quadrant of left breast encompassing a total area of the 6.4 x 2.4 x 4 cm as described. The  largest, superior and posterior mass was previously biopsied proven cancer. The 2 other smaller masses seen are suspicious for neoplasm.  RECOMMENDATION: A second-look ultrasound with biopsy of the 2 smaller masses not previously biopsied in the upper outer quadrant left breast. If the masses cannot be seen on ultrasound, then MR guided core biopsy is recommended if breast conservation is desired.   Electronically Signed   By: Sherian Rein M.D.   On: 04/11/2013 11:35   Mm Digital Diagnostic Unilat L  03/29/2013   *RADIOLOGY REPORT*  Clinical Data:  The patient was recalled from screening for possible left breast mass  DIGITAL DIAGNOSTIC LEFT MAMMOGRAM  AND LEFT BREAST ULTRASOUND:  Comparison:  Priors including 03/13/2013  Findings:  ACR Breast Density Category a:  The breast tissue is almost entirely fatty.  Within the upper-outer quadrant of the left breast there is a 2.5 cm irregular mass which persists on additional spot compression views.  This will be further evaluated with ultrasound.  On physical exam, I palpate a small firm mass within the upper- outer quadrant of the left breast.  Ultrasound is performed, showing a 1.5 x 1.2 x 1.6 cm irregular hypoechoic mass within the left breast 2 o'clock position 8 cm from the nipple.  Additionally there are two adjacent enlarged left axillary lymph nodes with the largest measuring up to 2.5 cm.  IMPRESSION: Suspicious left breast mass with enlarged left axillary lymph nodes. Findings are worrisome for primary breast malignancy and metastatic adenopathy.  Further evaluation with ultrasound-guided core needle biopsy is recommended.  This will be performed today 03/29/2013.  RECOMMENDATION: Ultrasound-guided core needle biopsy of left breast mass and left axillary lymph node.  I have discussed the findings and recommendations with the patient. Results were also provided in writing at the conclusion of the visit.  If applicable, a reminder letter will be sent to the  patient regarding the next appointment.  BI-RADS CATEGORY 4:  Suspicious abnormality - biopsy should be considered.   Original Report Authenticated By: Annia Belt, M.D   [place] Lt Breast Bx W Loc Dev 1st Lesion Img Bx Spec US Guide  04/03/2013   **ADDENDUM** CREATED: 04/03/2013 11:22:16  I spoke with the patient by telephone on 03/31/2013 to discuss pathology results.  Pathology demonstrates  invasive ductal carcinoma and metastatic left axillary lymphadenopathy.  These results were also discussed with the referring physician's office.  The patient reports no problems at the biopsy site.  All questions were answered.  Recommendations: Surgical consultation is recommended and has been scheduled with the [facility] on 04/12/2013.  Additionally the patient will be scheduled for a breast MRI and contacted with an appointment.  **END ADDENDUM** SIGNED BY: Antonieta Loveless, M.D  03/29/2013   *RADIOLOGY REPORT*  Clinical Data:  Patient with concerning left breast mass and enlarged left axillary lymph node.  ULTRASOUND GUIDED VACUUM ASSISTED CORE BIOPSY OF THE LEFT BREAST AND LEFT AXILLARY LYMPH NODE  Comparison: Previous exams.  I met with the patient and we discussed the procedure of ultrasound- guided biopsy, including benefits and alternatives.  We discussed the high likelihood of a successful procedure. We discussed the risks of the procedure including infection, bleeding, tissue injury, clip migration, and  inadequate sampling.  Informed written consent was given. The usual time-out protocol was performed immediately prior to the procedure.  Using sterile technique and 2% Lidocaine as local anesthetic, under direct ultrasound visualization, a 12 gauge vacuum-assisted device was used to perform biopsy of irregular left breast mass using a medial approach. At the conclusion of the procedure, a wing shaped tissue marker clip was deployed into the biopsy cavity and determined to be in appropriate position by post procedure mammogram.   Using sterile technique and 2% Lidocaine as local anesthetic, under direct ultrasound visualization, a 12 gauge vacuum-assisted device was used to perform biopsy of enlarged left axillary lymph node using a medial approach. At the conclusion of the procedure, a ribbion shaped tissue marker clip was deployed into the biopsy cavity and determined to be in appropriate position by post procedure mammogram.  IMPRESSION: Ultrasound-guided biopsy of left breast mass and left axillary lymph node.  No apparent complications.   Original Report Authenticated By: Annia Belt, M.D   [place] Lt Plc Breast Loc Dev   Ea Add Lesion  Inc US Guide  04/03/2013   **ADDENDUM** CREATED: 04/03/2013 11:22:16  I spoke with the patient by telephone on 03/31/2013 to discuss pathology results.  Pathology demonstrates  invasive ductal carcinoma and metastatic left axillary lymphadenopathy.  These results were also discussed with the referring physician's office.  The patient reports no problems at the biopsy site.  All questions were answered.  Recommendations: Surgical consultation is recommended and has been scheduled with the [facility] on 04/12/2013.  Additionally the patient will be scheduled for a breast MRI and contacted with an appointment.  **END ADDENDUM** SIGNED BY: Antonieta Loveless, M.D  03/29/2013   *RADIOLOGY REPORT*  Clinical Data:  Patient with concerning left breast mass and enlarged left axillary lymph node.  ULTRASOUND GUIDED VACUUM ASSISTED CORE BIOPSY OF THE LEFT BREAST AND LEFT AXILLARY LYMPH NODE  Comparison: Previous exams.  I met with the patient and we discussed the procedure of ultrasound- guided biopsy, including benefits and alternatives.  We discussed the high likelihood of a successful procedure. We discussed the risks of the procedure including infection, bleeding, tissue injury, clip migration, and inadequate sampling.  Informed written consent was given. The usual time-out protocol was performed immediately prior to the  procedure.  Using sterile technique and 2% Lidocaine as local anesthetic, under direct ultrasound visualization, a 12 gauge vacuum-assisted device was used to perform biopsy of irregular left breast mass using a medial approach. At the conclusion of the procedure, a wing shaped tissue marker clip was deployed into the biopsy cavity and determined to be in appropriate position by post procedure mammogram.  Using sterile technique and 2% Lidocaine as local anesthetic, under direct ultrasound visualization, a 12 gauge vacuum-assisted device was used to perform biopsy of enlarged left axillary lymph node using a medial approach. At the conclusion of the procedure, a ribbion shaped tissue marker clip was deployed into the biopsy cavity and determined to be in appropriate position by post procedure mammogram.  IMPRESSION: Ultrasound-guided biopsy of left breast mass and left axillary lymph node.  No apparent complications.   Original Report Authenticated By: Annia Belt, M.D    ASSESSMENT: 52 y.o. [place], [place] woman status post left breast biopsy 03/29/2013 for a clinical T2-T3 pN1, stage IIB-IIIA invasive ductal carcinoma, grade 2-3, estrogen receptor 39% positive (with moderate staining intensity), progesterone receptor negative, with an MIB-1 of 83%, and HER-2 amplified by CISH, with a ratio of 7.5 and 15  HER-2 copies per cell  PLAN: We spent the better part of today's hour-long appointment discussing the biology of breast cancer in general, and the specifics of the patient's tumor in particular. She understands that with a total area of tumor or possibly as large as 6.4 cm, and a documented positive lymph node, she will need staging studies, and we are setting her up for a CT of the chest and a PET scan in the near future.  She understands that whether she receives chemotherapy first or surgery first the ultimate result is the same. In her case we suggest chemotherapy first because it will make the surgery  easier and make it more likely for her to keep the breast. We do need to biopsy the 2 additional areas in the same breast which may or may not be malignant and that is being scheduled for later this week. She will need a port placed and an echocardiogram.  The plan is going to be for treatment with carboplatin, docetaxel and trastuzumab/ pertuzumab. If she obtains a complete pathologic response in the breast she will qualify for the 2 studies we have available, depending on whether she has residual positive lymph nodes or not. These studies were mentioned today, but not discussed in detail.  I have made her a return appointment with me later this month. By then we will have results of the restaging studies, echo, and she will have had her port. She may wish to receive all her chemotherapy in the evening clinic. She does understand that from that point of view of physician's that is unsettled, as there is a locums presently but we still do not know who is going to be the physician in charge they're beginning January. She may wish to start her treatments here and continue they're, however all her treatments here, or hot all her treatments there, at her discretion.  Shanice has a good understanding of the overall plan. She knows to call for any problems that may develop before her next visit here Lowella Dell, MD   04/12/2013 10:17 AM

## 2013-04-12 NOTE — Telephone Encounter (Signed)
, °

## 2013-04-12 NOTE — Telephone Encounter (Signed)
Received a call from [facility] at the [facility] who wanted to let Dr. Ezzard Standing know that they will see the patient Monday, 04/17/13.  She said there were some date discrepancies regarding which date she would be seen by them in his note.  She just wanted to make sure he was aware of the date they will see her.

## 2013-04-13 ENCOUNTER — Telehealth: Payer: Self-pay | Admitting: Obstetrics and Gynecology

## 2013-04-13 ENCOUNTER — Encounter: Payer: Self-pay | Admitting: Oncology

## 2013-04-13 ENCOUNTER — Encounter (HOSPITAL_COMMUNITY): Payer: Self-pay | Admitting: Pharmacy Technician

## 2013-04-13 ENCOUNTER — Other Ambulatory Visit: Payer: Self-pay | Admitting: *Deleted

## 2013-04-13 NOTE — Telephone Encounter (Signed)
Phone call to acknowledge that patient was just diagnosed with breast cancer and is in the planning stages for her care.  She thanked me for calling saying that she was feeling alone this morning and was glad to know that some one cares about her.  I encouraged her to follow through with any peer referrals that the Cancer Center makes for her to discuss breast cancer with another patient who has already gone through treatment.

## 2013-04-13 NOTE — Progress Notes (Signed)
Put fmla form on nurse's desk °

## 2013-04-14 ENCOUNTER — Ambulatory Visit
Admission: RE | Admit: 2013-04-14 | Discharge: 2013-04-14 | Disposition: A | Discharge status: Home / Self Care | Payer: BC Managed Care – PPO | Source: Ambulatory Visit | Attending: Family Medicine | Admitting: Family Medicine

## 2013-04-14 ENCOUNTER — Encounter: Payer: Self-pay | Admitting: Oncology

## 2013-04-14 ENCOUNTER — Other Ambulatory Visit: Payer: Self-pay | Admitting: Family Medicine

## 2013-04-14 ENCOUNTER — Telehealth: Payer: Self-pay | Admitting: Oncology

## 2013-04-14 DIAGNOSIS — R928 Other abnormal and inconclusive findings on diagnostic imaging of breast: Secondary | ICD-10-CM

## 2013-04-14 NOTE — Progress Notes (Signed)
Put fmla form in registration desk °

## 2013-04-14 NOTE — Telephone Encounter (Signed)
, °

## 2013-04-17 ENCOUNTER — Encounter (HOSPITAL_COMMUNITY): Payer: Self-pay | Admitting: Pharmacy Technician

## 2013-04-17 ENCOUNTER — Other Ambulatory Visit: Payer: BC Managed Care – PPO

## 2013-04-18 ENCOUNTER — Ambulatory Visit
Admission: RE | Admit: 2013-04-18 | Discharge: 2013-04-18 | Disposition: A | Discharge status: Home / Self Care | Payer: BC Managed Care – PPO | Source: Ambulatory Visit | Attending: Family Medicine | Admitting: Family Medicine

## 2013-04-18 DIAGNOSIS — R928 Other abnormal and inconclusive findings on diagnostic imaging of breast: Secondary | ICD-10-CM

## 2013-04-18 MED ORDER — GADOBENATE DIMEGLUMINE 529 MG/ML IV SOLN
15.0000 mL | Freq: Once | INTRAVENOUS | Status: AC | PRN
  Administered 2013-04-18: 15 mL via INTRAVENOUS

## 2013-04-18 NOTE — Progress Notes (Signed)
CMET 04/12/13 on EPIC, CBC with diff 04/12/13 on EPIC, Chest x-ray 07/05/12 on EPIC

## 2013-04-18 NOTE — Patient Instructions (Addendum)
[address] Gilkey  04/18/2013   Your procedure is scheduled on: 04/21/13  Report to [facility] at 5:15 AM.  Call this number if you have problems the morning of surgery [phone]: [phone]   Remember:   Do not eat food or drink liquids After Midnight.   Do not wear jewelry, make-up or nail polish.  Do not wear lotions, powders, or perfumes. You may wear deodorant.  Do not shave 48 hours prior to surgery. Men may shave face and neck.  Do not bring valuables to the hospital.  Contacts, dentures or bridgework may not be worn into surgery.    Patients discharged the day of surgery will not be allowed to drive home.  Name and phone number of your driver: Nicola Girt (husband) [phone]   Birdie Sons, RN  pre op nurse call if needed [phone]    FAILURE TO FOLLOW THESE INSTRUCTIONS MAY RESULT IN CANCELLATION OF YOUR SURGERY   Patient Signature: ___________________________________________

## 2013-04-19 ENCOUNTER — Encounter (HOSPITAL_COMMUNITY)
Admission: RE | Admit: 2013-04-19 | Discharge: 2013-04-19 | Disposition: A | Discharge status: Home / Self Care | Payer: BC Managed Care – PPO | Source: Ambulatory Visit | Attending: Surgery | Admitting: Surgery

## 2013-04-19 ENCOUNTER — Encounter (HOSPITAL_COMMUNITY): Payer: Self-pay

## 2013-04-19 ENCOUNTER — Telehealth: Payer: Self-pay | Admitting: *Deleted

## 2013-04-19 HISTORY — DX: Anxiety disorder, unspecified: F41.9

## 2013-04-19 HISTORY — DX: Gastro-esophageal reflux disease without esophagitis: K21.9

## 2013-04-19 HISTORY — DX: Pure hypercholesterolemia, unspecified: E78.00

## 2013-04-19 NOTE — Telephone Encounter (Signed)
Spoke with patient concerning [facility] 04/12/13.  Patient denies any questions or concerns regarding her diagnosis or treatment plan at this time.  I confirmed her scheduled appointments for ECHO,Chemo class, port placement, CT/PET and f/u with Dr. Darnelle Catalan.  I verified that she had contact information and encouraged her to call for any needs.  Patient verbalized understanding.

## 2013-04-20 ENCOUNTER — Encounter: Payer: Self-pay | Admitting: *Deleted

## 2013-04-20 ENCOUNTER — Ambulatory Visit (HOSPITAL_COMMUNITY)
Admission: RE | Admit: 2013-04-20 | Discharge: 2013-04-20 | Disposition: A | Discharge status: Home / Self Care | Payer: BC Managed Care – PPO | Source: Ambulatory Visit | Attending: Oncology | Admitting: Oncology

## 2013-04-20 ENCOUNTER — Other Ambulatory Visit: Payer: BC Managed Care – PPO

## 2013-04-20 DIAGNOSIS — I519 Heart disease, unspecified: Secondary | ICD-10-CM

## 2013-04-20 DIAGNOSIS — C50912 Malignant neoplasm of unspecified site of left female breast: Secondary | ICD-10-CM

## 2013-04-20 DIAGNOSIS — Z01818 Encounter for other preprocedural examination: Secondary | ICD-10-CM | POA: Insufficient documentation

## 2013-04-20 DIAGNOSIS — C50919 Malignant neoplasm of unspecified site of unspecified female breast: Secondary | ICD-10-CM | POA: Insufficient documentation

## 2013-04-20 NOTE — Progress Notes (Signed)
Echocardiogram 2D Echocardiogram has been performed.  [name] [name] 04/20/2013, 11:13 AM

## 2013-04-21 ENCOUNTER — Encounter (HOSPITAL_COMMUNITY): Payer: BC Managed Care – PPO | Admitting: Anesthesiology

## 2013-04-21 ENCOUNTER — Ambulatory Visit (HOSPITAL_COMMUNITY): Payer: BC Managed Care – PPO | Admitting: Anesthesiology

## 2013-04-21 ENCOUNTER — Ambulatory Visit (HOSPITAL_COMMUNITY)
Admission: RE | Admit: 2013-04-21 | Discharge: 2013-04-21 | Disposition: A | Discharge status: Home / Self Care | Payer: BC Managed Care – PPO | Source: Ambulatory Visit | Attending: Surgery | Admitting: Surgery

## 2013-04-21 ENCOUNTER — Encounter (HOSPITAL_COMMUNITY): Payer: Self-pay | Admitting: *Deleted

## 2013-04-21 ENCOUNTER — Encounter (HOSPITAL_COMMUNITY)
Admission: RE | Disposition: A | Discharge status: Home / Self Care | Payer: Self-pay | Source: Ambulatory Visit | Attending: Surgery

## 2013-04-21 ENCOUNTER — Ambulatory Visit (HOSPITAL_COMMUNITY): Payer: BC Managed Care – PPO

## 2013-04-21 DIAGNOSIS — M169 Osteoarthritis of hip, unspecified: Secondary | ICD-10-CM | POA: Insufficient documentation

## 2013-04-21 DIAGNOSIS — C50412 Malignant neoplasm of upper-outer quadrant of left female breast: Secondary | ICD-10-CM

## 2013-04-21 DIAGNOSIS — Z79899 Other long term (current) drug therapy: Secondary | ICD-10-CM | POA: Insufficient documentation

## 2013-04-21 DIAGNOSIS — C50419 Malignant neoplasm of upper-outer quadrant of unspecified female breast: Secondary | ICD-10-CM

## 2013-04-21 DIAGNOSIS — F411 Generalized anxiety disorder: Secondary | ICD-10-CM | POA: Insufficient documentation

## 2013-04-21 DIAGNOSIS — R002 Palpitations: Secondary | ICD-10-CM | POA: Insufficient documentation

## 2013-04-21 DIAGNOSIS — M1610 Unilateral primary osteoarthritis, unspecified hip: Secondary | ICD-10-CM | POA: Insufficient documentation

## 2013-04-21 DIAGNOSIS — C50919 Malignant neoplasm of unspecified site of unspecified female breast: Secondary | ICD-10-CM | POA: Insufficient documentation

## 2013-04-21 HISTORY — PX: PORTACATH PLACEMENT: SHX2246

## 2013-04-21 SURGERY — INSERTION, TUNNELED CENTRAL VENOUS DEVICE, WITH PORT
Anesthesia: General | Site: Chest | Laterality: Right | Wound class: Clean

## 2013-04-21 MED ORDER — CEFAZOLIN SODIUM-DEXTROSE 2-3 GM-% IV SOLR
INTRAVENOUS | Status: AC
  Filled 2013-04-21: qty 50

## 2013-04-21 MED ORDER — LACTATED RINGERS IV SOLN: INTRAVENOUS | Status: DC

## 2013-04-21 MED ORDER — LACTATED RINGERS IV SOLN
INTRAVENOUS | Status: DC | PRN
  Administered 2013-04-21: 07:00:00 via INTRAVENOUS

## 2013-04-21 MED ORDER — SODIUM CHLORIDE 0.9 % IR SOLN
Status: DC | PRN
  Administered 2013-04-21: 1000 mL

## 2013-04-21 MED ORDER — LIDOCAINE HCL 1 % IJ SOLN
INTRAMUSCULAR | Status: AC
  Filled 2013-04-21: qty 40

## 2013-04-21 MED ORDER — HYDROMORPHONE HCL PF 1 MG/ML IJ SOLN: 0.2500 mg | INTRAMUSCULAR | Status: DC | PRN

## 2013-04-21 MED ORDER — MIDAZOLAM HCL 5 MG/5ML IJ SOLN
INTRAMUSCULAR | Status: DC | PRN
  Administered 2013-04-21: 2 mg via INTRAVENOUS

## 2013-04-21 MED ORDER — HEPARIN SOD (PORK) LOCK FLUSH 100 UNIT/ML IV SOLN
INTRAVENOUS | Status: DC | PRN
  Administered 2013-04-21: 500 [IU] via INTRAVENOUS

## 2013-04-21 MED ORDER — CEFAZOLIN SODIUM-DEXTROSE 2-3 GM-% IV SOLR
2.0000 g | INTRAVENOUS | Status: AC
  Administered 2013-04-21: 2 g via INTRAVENOUS

## 2013-04-21 MED ORDER — ONDANSETRON HCL 4 MG/2ML IJ SOLN
INTRAMUSCULAR | Status: DC | PRN
  Administered 2013-04-21: 4 mg via INTRAMUSCULAR

## 2013-04-21 MED ORDER — PROMETHAZINE HCL 25 MG/ML IJ SOLN: 6.2500 mg | INTRAMUSCULAR | Status: DC | PRN

## 2013-04-21 MED ORDER — LIDOCAINE HCL (PF) 1 % IJ SOLN
INTRAMUSCULAR | Status: DC | PRN
  Administered 2013-04-21: 7 mL

## 2013-04-21 MED ORDER — HYDROCODONE-ACETAMINOPHEN 5-325 MG PO TABS: 1.0000 | ORAL_TABLET | Freq: Four times a day (QID) | ORAL | Status: DC | PRN

## 2013-04-21 MED ORDER — LIDOCAINE HCL (CARDIAC) 20 MG/ML IV SOLN
INTRAVENOUS | Status: DC | PRN
  Administered 2013-04-21: 50 mg via INTRAVENOUS

## 2013-04-21 MED ORDER — HEPARIN SOD (PORK) LOCK FLUSH 100 UNIT/ML IV SOLN
INTRAVENOUS | Status: AC
  Filled 2013-04-21: qty 10

## 2013-04-21 MED ORDER — FENTANYL CITRATE 0.05 MG/ML IJ SOLN
INTRAMUSCULAR | Status: DC | PRN
  Administered 2013-04-21: 100 ug via INTRAVENOUS

## 2013-04-21 MED ORDER — PROPOFOL 10 MG/ML IV BOLUS
INTRAVENOUS | Status: DC | PRN
  Administered 2013-04-21: 160 mg via INTRAVENOUS

## 2013-04-21 MED ORDER — SODIUM CHLORIDE 0.9 % IR SOLN
Freq: Once | Status: AC
  Administered 2013-04-21: 08:00:00
  Filled 2013-04-21: qty 1.2

## 2013-04-21 SURGICAL SUPPLY — 41 items
APL SKNCLS STERI-STRIP NONHPOA (GAUZE/BANDAGES/DRESSINGS) ×1
BAG DECANTER FOR FLEXI CONT (MISCELLANEOUS) ×2 IMPLANT
BENZOIN TINCTURE PRP APPL 2/3 (GAUZE/BANDAGES/DRESSINGS) ×2 IMPLANT
BLADE SURG SZ10 CARB STEEL (BLADE) ×2 IMPLANT
CHLORAPREP W/TINT 10.5 ML (MISCELLANEOUS) ×2 IMPLANT
CLOTH BEACON ORANGE TIMEOUT ST (SAFETY) IMPLANT
DECANTER SPIKE VIAL GLASS SM (MISCELLANEOUS) ×2 IMPLANT
DRAPE C-ARM 42X120 X-RAY (DRAPES) ×2 IMPLANT
DRAPE LAPAROTOMY TRNSV 102X78 (DRAPE) ×2 IMPLANT
ELECT REM PT RETURN 9FT ADLT (ELECTROSURGICAL) ×2
ELECTRODE REM PT RTRN 9FT ADLT (ELECTROSURGICAL) ×1 IMPLANT
GAUZE SPONGE 2X2 8PLY STRL LF (GAUZE/BANDAGES/DRESSINGS) IMPLANT
GAUZE SPONGE 4X4 16PLY XRAY LF (GAUZE/BANDAGES/DRESSINGS) ×2 IMPLANT
GLOVE BIOGEL PI IND STRL 7.0 (GLOVE) ×1 IMPLANT
GLOVE BIOGEL PI INDICATOR 7.0 (GLOVE) ×1
GLOVE SURG SIGNA 7.5 PF LTX (GLOVE) ×2 IMPLANT
GOWN PREVENTION PLUS LG XLONG (DISPOSABLE) IMPLANT
GOWN STRL REIN XL XLG (GOWN DISPOSABLE) ×4 IMPLANT
IV CATHETER PAT DEVICE CLC2000 (IV SETS) IMPLANT
KIT BASIN OR (CUSTOM PROCEDURE TRAY) ×2 IMPLANT
KIT PORT POWER 8FR ISP CVUE (Catheter) ×2 IMPLANT
KIT POWER CATH 8FR (Catheter) IMPLANT
MARKER SKIN DUAL TIP RULER LAB (MISCELLANEOUS) IMPLANT
NEEDLE HYPO 22GX1.5 SAFETY (NEEDLE) ×2 IMPLANT
NEEDLE HYPO 25X1 1.5 SAFETY (NEEDLE) ×2 IMPLANT
NS IRRIG 1000ML POUR BTL (IV SOLUTION) ×2 IMPLANT
PACK BASIC VI WITH GOWN DISP (CUSTOM PROCEDURE TRAY) ×2 IMPLANT
PENCIL BUTTON HOLSTER BLD 10FT (ELECTRODE) ×2 IMPLANT
SPONGE GAUZE 2X2 STER 10/PKG (GAUZE/BANDAGES/DRESSINGS)
SPONGE GAUZE 4X4 12PLY (GAUZE/BANDAGES/DRESSINGS) ×2 IMPLANT
STAPLER VISISTAT 35W (STAPLE) ×2 IMPLANT
STRIP CLOSURE SKIN 1/2X4 (GAUZE/BANDAGES/DRESSINGS) ×2 IMPLANT
STRIP CLOSURE SKIN 1/4X4 (GAUZE/BANDAGES/DRESSINGS) ×2 IMPLANT
SUT VIC AB 3-0 SH 18 (SUTURE) ×2 IMPLANT
SUT VIC AB 5-0 PS2 18 (SUTURE) ×2 IMPLANT
SYR BULB IRRIGATION 50ML (SYRINGE) ×2 IMPLANT
SYR CONTROL 10ML LL (SYRINGE) ×2 IMPLANT
SYRINGE 10CC LL (SYRINGE) ×2 IMPLANT
TAPE CLOTH SURG 4X10 WHT LF (GAUZE/BANDAGES/DRESSINGS) ×2 IMPLANT
TOWEL OR 17X26 10 PK STRL BLUE (TOWEL DISPOSABLE) ×2 IMPLANT
WATER STERILE IRR 1500ML POUR (IV SOLUTION) IMPLANT

## 2013-04-21 NOTE — Transfer of Care (Signed)
Immediate Anesthesia Transfer of Care Note  Patient: [name] [name]  Procedure(s) Performed: Procedure(s): INSERTION PORT-A-CATH (Right)  Patient Location: PACU  Anesthesia Type:General  Level of Consciousness: awake, alert  and oriented  Airway & Oxygen Therapy: Patient Spontanous Breathing and Patient connected to face mask oxygen  Post-op Assessment: Report given to PACU RN and Post -op Vital signs reviewed and stable  Post vital signs: Reviewed and stable  Complications: No apparent anesthesia complications

## 2013-04-21 NOTE — Discharge Instructions (Signed)
[facility] - DISCHARGE INSTRUCTIONS TO PATIENT  Activity:  Driving - May drive tomorrow, if doing well   Lifting - Take it easy for 2 or 3 days, but then no limit  Wound Care:   Leave incision dry for 48 hours, then may shower.  Leave steristrips until they fall off on their own.  Diet:  As tolerated  Follow up appointment:  Call Dr. Allene Pyo office [facility]) at [phone] for an appointment in 2 to 3 weeks.  Medications and dosages:  Resume your home medications.  You have a prescription for:  Vicodin.  Call Dr. Ezzard Standing or his office  [phone]) if you have:  Temperature greater than 100.4,  Persistent nausea and vomiting,  Severe uncontrolled pain,  Redness, tenderness, or signs of infection (pain, swelling, redness, odor or green/yellow discharge around the site),  Difficulty breathing, headache or visual disturbances,  Any other questions or concerns you may have after discharge.  In an emergency, call 911 or go to an Emergency Department at a nearby hospital.

## 2013-04-21 NOTE — Anesthesia Postprocedure Evaluation (Signed)
Anesthesia Post Note  Patient: [name] [name]  Procedure(s) Performed: Procedure(s) (LRB): INSERTION PORT-A-CATH (Right)  Anesthesia type: General  Patient location: PACU  Post pain: Pain level controlled  Post assessment: Post-op Vital signs reviewed  Last Vitals:  Filed Vitals:   04/21/13 1034  BP: 111/58  Pulse: 59  Temp: 36.9 C  Resp: 16    Post vital signs: Reviewed  Level of consciousness: sedated  Complications: No apparent anesthesia complications

## 2013-04-21 NOTE — Progress Notes (Signed)
Short Stay Phase 2 post op. Reported to me by Leonel Ramsay that CXR was done in PACU and report is in computer

## 2013-04-21 NOTE — Op Note (Signed)
04/21/2013  8:33 AM  PATIENT:  [name] [name], 52 y.o., female MRN: [id number] DOB: Jun 17, 1961  PREOP DIAGNOSIS:  left breast cancer, anticipate chemotherapy  POSTOP DIAGNOSIS:   Left breast cancer, anticipate chemotherapy  PROCEDURE:   Procedure(s): INSERTION PORT-A-CATH  (Clear Vue Bard Power Port)  SURGEON:   Ovidio Kin, M.D.  ASSISTANT:   None  ANESTHESIA:   general  Anesthesiologist: Einar Pheasant, MD CRNA: Enriqueta Shutter  General  EBL:  minimal  ml  COUNTS CORRECT:  YES  INDICATIONS FOR PROCEDURE:  [name] is a 52 y.o. (DOB: 1961-02-25) AA female whose primary care physician is Harlow Asa, MD and comes for power port placement for the treatment of left breast cancer.  Dr. Darnelle Catalan is her treating oncologist.   The indications and risks of the surgery were explained to the patient.  The risks include, but are not limited to, infection, bleeding, pneumothorax, nerve injury, and thrombosis of the vein.  OPERATIVE NOTE:  The patient was taken to Room #6 at [facility].  Anesthesia was provided by Anesthesiologist: Einar Pheasant, MD CRNA: Enriqueta Shutter.  At the beginning of the operation, the patient was given 2 gm Ancef, had a roll placed under her back, and had the upper chest/neck prepped with Chloroprep and draped.   A time out was held and the surgery checklist reviewed.   The patient was placed in Trendelenburg position.  The right subclavian vein was accessed with a 16 gauge needle and a guide wire threaded through the needle into the vein.  The position of the wire was checked with fluoroscopy.   I then developed a pocket in the upper inner aspect of the right chest for the port reservoir.  I used the Clear Vue Bard [employer] for venous access.  The reservoir was sewn in place with a 3-0 Vicryl suture.  The reservoir had been flushed with dilute (10 units/cc) heparin.   I then passed the silastic tubing from the reservoir incision  to the subclavian stick site and used the 8 French introducer to pass it into the vein.  The tip of the silastic catheter was position at the junction of the SVC and the right atrium under fluoroscopy.  The silastic catheter was then attached to the port with the bayonet device.     The entire port and tubing were checked with fluoroscopy and then the port was flushed with 4 cc concentrated heparin (100 units/cc).   The wounds were then closed with 3-0 vicryl subcutaneous sutures and the skin closed with a 5-0 Vicryl suture.  The skin was painted with tincture of benzoin and steri-stripped.   The patient was transferred to the recovery room in good condition.  The sponge and needle count were correct at the end of the case.  A CXR is ordered for port placement and pending at the time of this note.  Ovidio Kin, MD, [facility] Pager: [phone] Office phone:  [phone]

## 2013-04-21 NOTE — H&P (View-Only) (Signed)
 Re:   [name] [name] DOB:   12/21/1960 MRN:   [id number]  [facility]  ASSESSMENT AND PLAN: 1.  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  I discussed a multidisciplinary approach to the treatment of breast cancer, which includes medical oncology and radiation oncology.  I discussed the surgical options of lumpectomy vs. mastectomy.  If mastectomy, there is the possibility of reconstruction.   I discussed the options of lymph node biopsy.  The treatment plan depends on the pathologic staging of the tumor and the patient's personal wishes.  The risks of surgery include, but are not limited to, bleeding, infection, the need for further surgery, and nerve injury.  The final surgical decision of lumpectomy vs mastectomy is still up in the air.  If all the areas in her right breast are malignant, I still may be able to do a partial mastectomy (lumpectomy), but with a cosmetic deformity.  There is also the consideration that she could participate in one of two studies - NSABP B-51 or [facility] [id number].  Plan: 1) Biopsy of right breast x 2 this Friday, 10/9, 2) Power port placement [Scheduled 04/21/2013] , 3) Neoadjuvant chemotx (possible trial), 4)  Lumpectomy vs mastectomy, depending on path and response, 5) Axillary dissection vs. SLNBx depending on trial.  [Core biopsies 04/18/2013 - Inferior area shows high grade DCIS, Superior area shows fat necrosis.  Both thought to be concordant.  DN  04/19/2013]  2.  Palpitations  Sees Dr. R. Rothbart 3.  Osteoarthritis of both hips 4.  On Celebrex  To stop this one week before surgery 5.  To stop Premarin  6.  Anxiety 7.  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 Gross for this.  A CT scan 03/27/2013 showed no direct evidence of a rectovaginal fistula.  Dr. Gross has suggested another test to try to "document" the  fistula.  That is on hold during for now as she goes through the treatment for breast cancer.  The patient will keep us informed about her symptoms.  REFERRING PHYSICIAN: LUKING,W S, MD  HISTORY OF PRESENT ILLNESS: [name] [name] is a 52 y.o. (DOB: 04/20/1961)  AA  female whose primary care physician is LUKING,W S, MD and [facility] a new left breast cancer. Her husband is with her.  She gets a mammogram every year.  She felt a lump in the upper outer part of her left breast.  She has no family history of breast cancer.  She takes a Premarin on a PRN basis.  It sounds like she takes no more that a few tabs a month.  She will stop this.  She had a hysterectomy for fibroids in 2010.  She had a mammogram at [place] on 03/13/2013 which found a left breast mass.  On 03/29/2013 she had a left breast biopsy.  The biopsy (Accession: [id number]) showed IDC and metastatic carcinoma to a left axillary lymph node. MRI 04/11/2013 shows - 3 abnormal enhancing masses in the upper-outer quadrant of left breast encompassing a total area of the 6.4 x 2.4 x 4 cm as described. The largest, superior and posterior mass was previously biopsied proven cancer. The 2 other smaller masses seen are suspicious for neoplasm.  She is for biopsy of these two other areas this Friday, 10/9.   Past Medical History  Diagnosis Date  .   Chest pain 2009    Consultation-Rothbart, negative chest CT; nl echo in 2005; h/o palpitations  . Palpitations   . Degenerative joint disease     + degenerative joint disease of the lumbosacral spine  . Colitis 2010    not IBD  . Depression   . Herpes simplex type II infection   . Allergic rhinitis   . Back pain   . Fibroid   . Heart murmur   . Breast cancer      Past Surgical History  Procedure Laterality Date  . Colonoscopy  10/2010    proctitis; melanosis coli  . Tubal ligation    . Breast excisional biopsy  04/2003    Left; benign disease  . Right oophorectomy   03/13/2007  . Abdominal hysterectomy  03/13/2007    TAH ?BSO--Dr Eure, [place], [place]      Current Outpatient Prescriptions  Medication Sig Dispense Refill  . atenolol (TENORMIN) 50 MG tablet TAKE 1 TABLET BY MOUTH EVERY DAY FOR PALPITATION  90 tablet  1  . Celecoxib (CELEBREX PO) Take by mouth daily.      . citalopram (CELEXA) 40 MG tablet TAKE 1 TABLET BY MOUTH EVERY DAY  90 tablet  1  . phentermine (ADIPEX-P) 37.5 MG tablet Take 1 tablet (37.5 mg total) by mouth daily before breakfast.  30 tablet  2  . PREMARIN vaginal cream Place 0.5 g vaginally as needed.       . valACYclovir (VALTREX) 1000 MG tablet TAKE 1 TABLET BY MOUTH DAILY  30 tablet  5   No current facility-administered medications for this visit.      Allergies  Allergen Reactions  . Doxycycline Other (See Comments)    Unknown    REVIEW OF SYSTEMS: Skin:  No history of rash.  No history of abnormal moles. Infection:  No history of hepatitis or HIV.  No history of MRSA. Neurologic:  No history of stroke.  No history of seizure.  No history of headaches. Cardiac:  Palpitations.  Has seen Dr. Rothbart. Pulmonary:  Does not smoke cigarettes.  No asthma or bronchitis.  No OSA/CPAP.  Endocrine:  No diabetes. No thyroid disease. Gastrointestinal:  Has seen Dr. Gross for possible colo-vesicle fistula, but CT scan was negative.  Had negative colonoscopy by Dr. Rehman in 11/03/2010.  For now, her fistula evaluation and treatment are on hold. Urologic:  No history of kidney stones.  No history of bladder infections. Musculoskeletal:  Osteoarthritis of both hips, on Celebrex.  She cannot remember the name of the orthopedists. Hematologic:  No bleeding disorder.  On Celebrex. Psycho-social:  The patient is oriented.   History of anxiety.  SOCIAL and FAMILY HISTORY: Married. Husband, Jonathan, with her. She has 4 children.  3 daughters and 1 son. Ages 32, 31, 29, 22. Works as a filler operator at [employer]  PHYSICAL  EXAM: LMP 12/04/2008  General: WN AA F who is alert and generally healthy appearing.  HEENT: Normal. Pupils equal. Neck: Supple. No mass.  No thyroid mass. Lymph Nodes:  No supraclavicular, cervical, or axillary nodes.  I paid particular attention to the left axilla where her biopsy was positive, but I did not feel a mass. Breasts:  Right - normal   Left - Fullness at 2 o'clock.  Not a discreet mass. Lungs: Clear to auscultation and symmetric breath sounds.  Heart:  RRR. No murmur or rub. Abdomen: Soft. No mass. No tenderness. No hernia. Normal bowel sounds.  Rectal: Not done. Extremities:    Good strength and ROM  in upper and lower extremities. Neurologic:  Grossly intact to motor and sensory function. Psychiatric: Has normal mood and affect. Behavior is normal.   DATA REVIEWED: Epic notes.  Lenzy Kerschner, MD,  FACS [facility] [address]  [address]   [place], [place]    [id number] Phone:  [phone] FAX:  [phone]  

## 2013-04-21 NOTE — Anesthesia Preprocedure Evaluation (Addendum)
Anesthesia Evaluation  Patient identified by MRN, date of birth, ID band Patient awake    Reviewed: Allergy & Precautions, H&P , NPO status , Patient's Chart, lab work & pertinent test results  Airway Mallampati: II TM Distance: >3 FB Neck ROM: Full    Dental  (+) Teeth Intact and Dental Advisory Given   Pulmonary former smoker,  breath sounds clear to auscultation  Pulmonary exam normal       Cardiovascular hypertension, Pt. on medications and Pt. on home beta blockers + Valvular Problems/Murmurs Rhythm:Regular Rate:Normal + Systolic murmurs    Neuro/Psych Anxiety Depression negative neurological ROS     GI/Hepatic Neg liver ROS, GERD-  ,  Endo/Other  negative endocrine ROS  Renal/GU negative Renal ROS  negative genitourinary   Musculoskeletal  (+) Arthritis -,   Abdominal   Peds  Hematology negative hematology ROS (+)   Anesthesia Other Findings   Reproductive/Obstetrics negative OB ROS Breast CA                          Anesthesia Physical Anesthesia Plan  ASA: II  Anesthesia Plan: General   Post-op Pain Management:    Induction: Intravenous  Airway Management Planned: LMA  Additional Equipment:   Intra-op Plan:   Post-operative Plan: Extubation in OR  Informed Consent: I have reviewed the patients History and Physical, chart, labs and discussed the procedure including the risks, benefits and alternatives for the proposed anesthesia with the patient or authorized representative who has indicated his/her understanding and acceptance.   Dental advisory given  Plan Discussed with: CRNA  Anesthesia Plan Comments:         Anesthesia Quick Evaluation

## 2013-04-21 NOTE — Interval H&P Note (Signed)
History and Physical Interval Note:  04/21/2013 7:35 AM  [name] [name]  has presented today for surgery, with the diagnosis of left breast cancer  The various methods of treatment have been discussed with the patient and family. I reviewed path report with patient.  Her husband is here.  After consideration of risks, benefits and other options for treatment, the patient has consented to  Procedure(s): INSERTION PORT-A-CATH (Left) as a surgical intervention .    The patient's history has been reviewed, patient examined, no change in status, stable for surgery.  I have reviewed the patient's chart and labs.  Questions were answered to the patient's satisfaction.     Hunter Bachar H

## 2013-04-24 ENCOUNTER — Encounter (HOSPITAL_COMMUNITY): Payer: Self-pay | Admitting: Surgery

## 2013-04-24 ENCOUNTER — Ambulatory Visit: Payer: BC Managed Care – PPO | Admitting: Oncology

## 2013-04-26 ENCOUNTER — Encounter (HOSPITAL_COMMUNITY)
Admission: RE | Admit: 2013-04-26 | Discharge: 2013-04-26 | Disposition: A | Discharge status: Home / Self Care | Payer: BC Managed Care – PPO | Source: Ambulatory Visit | Attending: Oncology | Admitting: Oncology

## 2013-04-26 ENCOUNTER — Ambulatory Visit (HOSPITAL_COMMUNITY)
Admission: RE | Admit: 2013-04-26 | Discharge: 2013-04-26 | Disposition: A | Discharge status: Home / Self Care | Payer: BC Managed Care – PPO | Source: Ambulatory Visit | Attending: Oncology | Admitting: Oncology

## 2013-04-26 DIAGNOSIS — C773 Secondary and unspecified malignant neoplasm of axilla and upper limb lymph nodes: Secondary | ICD-10-CM | POA: Insufficient documentation

## 2013-04-26 DIAGNOSIS — C50919 Malignant neoplasm of unspecified site of unspecified female breast: Secondary | ICD-10-CM | POA: Insufficient documentation

## 2013-04-26 DIAGNOSIS — C50912 Malignant neoplasm of unspecified site of left female breast: Secondary | ICD-10-CM

## 2013-04-26 LAB — GLUCOSE, CAPILLARY: Glucose-Capillary: 90 mg/dL (ref 70–99)

## 2013-04-26 MED ORDER — FLUDEOXYGLUCOSE F - 18 (FDG) INJECTION
19.1000 | Freq: Once | INTRAVENOUS | Status: AC | PRN
  Administered 2013-04-26: 19.1 via INTRAVENOUS

## 2013-04-26 MED ORDER — IOHEXOL 300 MG/ML  SOLN
80.0000 mL | Freq: Once | INTRAMUSCULAR | Status: AC | PRN
  Administered 2013-04-26: 80 mL via INTRAVENOUS

## 2013-04-27 ENCOUNTER — Telehealth: Payer: Self-pay | Admitting: *Deleted

## 2013-04-27 ENCOUNTER — Other Ambulatory Visit: Payer: Self-pay | Admitting: Family Medicine

## 2013-04-27 ENCOUNTER — Ambulatory Visit (HOSPITAL_BASED_OUTPATIENT_CLINIC_OR_DEPARTMENT_OTHER): Payer: BC Managed Care – PPO | Admitting: Oncology

## 2013-04-27 VITALS — BP 113/69 | HR 64 | Temp 98.0°F | Resp 20 | Ht 61.0 in | Wt 167.9 lb

## 2013-04-27 DIAGNOSIS — C50419 Malignant neoplasm of upper-outer quadrant of unspecified female breast: Secondary | ICD-10-CM

## 2013-04-27 DIAGNOSIS — Z170 Estrogen receptor positive status [ER+]: Secondary | ICD-10-CM

## 2013-04-27 DIAGNOSIS — C50412 Malignant neoplasm of upper-outer quadrant of left female breast: Secondary | ICD-10-CM

## 2013-04-27 MED ORDER — PROCHLORPERAZINE MALEATE 10 MG PO TABS: 10.0000 mg | ORAL_TABLET | Freq: Four times a day (QID) | ORAL | Status: DC | PRN

## 2013-04-27 MED ORDER — LORAZEPAM 0.5 MG PO TABS: 0.5000 mg | ORAL_TABLET | Freq: Four times a day (QID) | ORAL | Status: DC | PRN

## 2013-04-27 MED ORDER — LIDOCAINE-PRILOCAINE 2.5-2.5 % EX CREA: TOPICAL_CREAM | CUTANEOUS | Status: DC | PRN

## 2013-04-27 MED ORDER — DEXAMETHASONE 4 MG PO TABS: 8.0000 mg | ORAL_TABLET | Freq: Two times a day (BID) | ORAL | Status: DC

## 2013-04-27 MED ORDER — ONDANSETRON HCL 8 MG PO TABS: 8.0000 mg | ORAL_TABLET | Freq: Two times a day (BID) | ORAL | Status: DC

## 2013-04-27 NOTE — Progress Notes (Signed)
ID: [name] [name] OB: 07-21-1960  MR#: [id number]  NWG#:[id number]  PCP: Harlow Asa, MD GYN:   SU:  OTHER MD: Barron Alvine  CHIEF COMPLAINT: "I have a lump in my breast"  HISTORY OF PRESENT ILLNESS: [name] palpated a mass in her left breast in late August 2014. She was already scheduled for screening mammography at any pen and this was performed 03/13/2013. Indeed a possible mass was noted in the left breast and on 03/29/2013 the patient underwent diagnostic left mammography and left ultrasonography. This showed a 2.5 cm irregular mass in the upper outer quadrant of the left breast. This was palpable.by ultrasound there was a 1.6 cm irregular hypoechoic mass in the area in question, and in addition to enlarged left axillary lymph nodes were noted, the largest measuring 2.5 cm.  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  Bilateral breast MRI 04/11/2013 shows 3 abnormal enhancing masses in the upper outer quadrant of the left breast measuring altogether 6.4 cm. The largest separate mass measured 2.7 cm. There were abnormal or large lymph nodes in the left axilla. Ultrasound guided biopsy of the 2 smaller masses has been scheduled.  The patient's subsequent history is as detailed below   INTERVAL HISTORY: [name] returns today for followup of her breast cancer accompanied by her husband Christiane Ha. Since her last visit here she had staging scans which luckily showed no evidence of metastatic disease. She had an echocardiogram which shows a well preserved ejection fraction. She came to "chemotherapy school" in very much appreciated the  information she received there. Finally she had a port placed. She is now ready to start chemotherapy.  REVIEW OF SYSTEMS: She tolerated the port placement without significant problems. There was in particular no significant pain, and no bleeding. She has a history of irregular heartbeat, but this has not been a recent problem. She has mild headaches occasional. She describes herself as anxious and depressed, but this is not apparent in her interactions year. A detailed review of systems was otherwise noncontributory  PAST MEDICAL HISTORY: Past Medical History  Diagnosis Date  . Chest pain 2009    Consultation-[facility], negative chest CT; nl echo in 2005; h/o palpitations  . Palpitations   . Degenerative joint disease     + degenerative joint disease of the lumbosacral spine  . Colitis 2010    not IBD  . Herpes simplex type II infection   . Allergic rhinitis   . Hypercholesteremia     "slightly high"  . Anxiety   . Heart murmur     "small"  . GERD (gastroesophageal reflux disease)     "a little"  . Breast cancer 03/31/13    left    PAST SURGICAL HISTORY: Past Surgical History  Procedure Laterality Date  . Colonoscopy  10/2010    proctitis; melanosis coli  . Tubal ligation    . Breast excisional biopsy  04/2003    Left; benign disease  . Right oophorectomy  03/13/2007  . Abdominal hysterectomy  03/13/2007    TAH ?BSO--Dr Cheron Every, [place]  . Eye surgery Left     "fix lazy eye"  .  Portacath placement Right 04/21/2013    Procedure: INSERTION PORT-A-CATH;  Surgeon: Kandis Cocking, MD;  Location: [facility];  Service: General;  Laterality: Right;    FAMILY HISTORY Family History  Problem Relation Age of Onset  . Aneurysm Mother     Cerebral  . Hypertension Mother   . Hyperlipidemia Mother   . Stroke Mother   . Coronary artery disease Father   . Hypertension Father   . Hyperlipidemia Father   . Lung cancer Brother   . Cancer Brother     Lung  The patient's father died at  the age of 46 from a myocardial infarction. The patient's mother died at the age of 60 from a stroke. The patient had 6 brothers, 2 sisters. One brother has a history of lung cancer in the setting of tobacco abuse. Another brother had multiple myeloma. There is no history of breast or ovarian cancer in the family  GYNECOLOGIC HISTORY:  Menarche age 29, first live birth age 34, the patient is GX P4. She status post abdominal hysterectomy with bilateral salpingo-oophorectomy. She never took hormone replacement.   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 Her husband [name] works in Designer, fashion/clothing. Daughter Candida Peeling lives at home with the patient. She is not employed. Son Robbye Dede lives in [place] and is not employed. Daughter [name] lives in [place] and is a Community education officer. Daughter [name] is a Child psychotherapist in [place]. The patient has 10 grandchildren. She attends the [employer]     ADVANCED DIRECTIVES: Not in place   HEALTH MAINTENANCE: History  Substance Use Topics  . Smoking status: Former Smoker -- 0.50 packs/day for 15 years    Types: Cigarettes    Quit date: 07/06/1982  . Smokeless tobacco: Never Used  . Alcohol Use: No     Colonoscopy:2012  PAP:2014   Bone density:  Lipid panel:  Allergies  Allergen Reactions  . Doxycycline Other (See Comments)    Unknown    Current Outpatient Prescriptions  Medication Sig Dispense Refill  . citalopram (CELEXA) 40 MG tablet Take 40 mg by mouth every evening.      . diphenhydramine-acetaminophen (TYLENOL PM) 25-500 MG TABS Take 2 tablets by mouth at bedtime.      . valACYclovir (VALTREX) 1000 MG tablet Take 1,000 mg by mouth daily.      Marland Kitchen atenolol (TENORMIN) 50 MG tablet TAKE 1 TABLET BY MOUTH EVERY DAY FOR PALPITATION  90 tablet  1  . dexamethasone (DECADRON) 4 MG tablet Take 2 tablets (8 mg total) by mouth 2 (two) times daily with a  meal. Take two times a day the day before Taxotere. Then take two times a day starting the day after chemo for 3 days.  30 tablet  1  . lidocaine-prilocaine (EMLA) cream Apply topically as needed.  30 g  0  . LORazepam (ATIVAN) 0.5 MG tablet Take 1 tablet (0.5 mg total) by mouth every 6 (six) hours as needed (Nausea or vomiting).  30 tablet  0  . ondansetron (ZOFRAN) 8 MG tablet Take 1 tablet (8 mg total) by mouth 2 (two) times daily. Take two times a day starting the day after chemo for 3 days. Then take two times a day as needed for nausea or vomiting.  30 tablet  1  . prochlorperazine (COMPAZINE) 10 MG tablet Take 1 tablet (10 mg total) by mouth every 6 (six)  hours as needed (Nausea or vomiting).  30 tablet  1   No current facility-administered medications for this visit.   Facility-Administered Medications Ordered in Other Visits  Medication Dose Route Frequency Provider Last Rate Last Dose  . CARBOplatin (PARAPLATIN) 730 mg in sodium chloride 0.9 % 250 mL chemo infusion  730 mg Intravenous Once Lowella Dell, MD      . DOCEtaxel (TAXOTERE) 130 mg in dextrose 5 % 250 mL chemo infusion  75 mg/m2 (Treatment Plan Actual) Intravenous Once Lowella Dell, MD      . heparin lock flush 100 unit/mL  500 Units Intracatheter Once PRN Lowella Dell, MD      . ondansetron (ZOFRAN) IVPB 16 mg  16 mg Intravenous Once Lowella Dell, MD   16 mg at 05/01/13 1318  . sodium chloride 0.9 % injection 10 mL  10 mL Intracatheter PRN Lowella Dell, MD        OBJECTIVE: Middle-aged [place] American woman in no acute distress Filed Vitals:   04/27/13 0845  BP: 113/69  Pulse: 64  Temp: 98 F (36.7 C)  Resp: 20     Body mass index is 31.74 kg/(m^2).    ECOG FS:0 - Asymptomatic  Ocular: Sclerae unicteric, pupils equal, round and reactive to light Ear-nose-throat: Oropharynx clear, dentition fair Lymphatic: No cervical or supraclavicular adenopathy Lungs no rales or rhonchi, good excursion  bilaterally Heart regular rate and rhythm, no murmur appreciated Abd soft, nontender, positive bowel sounds MSK no focal spinal tenderness, no joint edema Neuro: non-focal, well-oriented, appropriate affect Breasts: The right breast is unremarkable. In the left breast upper outer quadrant coming up laterally it is possible to palpate the hard edge of the mass, which is otherwise hard to measure overall.. There are no skin or nipple changes of concern. I do not palpate obvious left axillary adenopathy.   LAB RESULTS:  CMP     Component Value Date/Time   NA 143 04/12/2013 0839   NA 139 06/06/2008 0545   K 3.9 04/12/2013 0839   K 3.8 06/06/2008 0545   CL 104 06/06/2008 0545   CO2 28 04/12/2013 0839   CO2 26 06/06/2008 0545   GLUCOSE 95 04/12/2013 0839   GLUCOSE 112* 06/06/2008 0545   BUN 7.1 04/12/2013 0839   BUN 6 06/06/2008 0545   CREATININE 0.8 04/12/2013 0839   CREATININE 0.77 06/06/2008 0545   CALCIUM 9.8 04/12/2013 0839   CALCIUM 8.7 06/06/2008 0545   PROT 7.6 04/12/2013 0839   PROT 7.0 04/12/2008 1725   ALBUMIN 3.8 04/12/2013 0839   ALBUMIN 4.1 04/12/2008 1725   AST 19 04/12/2013 0839   AST 21 04/12/2008 1725   ALT 30 04/12/2013 0839   ALT 28 04/12/2008 1725   ALKPHOS 78 04/12/2013 0839   ALKPHOS 62 04/12/2008 1725   BILITOT 0.40 04/12/2013 0839   BILITOT 0.5 04/12/2008 1725   GFRNONAA >60 06/06/2008 0545   GFRAA  Value: >60        The eGFR has been calculated using the MDRD equation. This calculation has not been validated in all clinical 06/06/2008 0545    I No results found for this basename: SPEP,  UPEP,   kappa and lambda light chains    Lab Results  Component Value Date   WBC 9.9 04/12/2013   NEUTROABS 6.3 04/12/2013   HGB 12.6 04/12/2013   HCT 38.3 04/12/2013   MCV 83.4 04/12/2013   PLT 255 04/12/2013      Chemistry  Component Value Date/Time   NA 143 04/12/2013 0839   NA 139 06/06/2008 0545   K 3.9 04/12/2013 0839   K 3.8 06/06/2008 0545   CL 104 06/06/2008 0545   CO2 28  04/12/2013 0839   CO2 26 06/06/2008 0545   BUN 7.1 04/12/2013 0839   BUN 6 06/06/2008 0545   CREATININE 0.8 04/12/2013 0839   CREATININE 0.77 06/06/2008 0545      Component Value Date/Time   CALCIUM 9.8 04/12/2013 0839   CALCIUM 8.7 06/06/2008 0545   ALKPHOS 78 04/12/2013 0839   ALKPHOS 62 04/12/2008 1725   AST 19 04/12/2013 0839   AST 21 04/12/2008 1725   ALT 30 04/12/2013 0839   ALT 28 04/12/2008 1725   BILITOT 0.40 04/12/2013 0839   BILITOT 0.5 04/12/2008 1725       No results found for this basename: LABCA2    No components found with this basename: LABCA125    No results found for this basename: INR,  in the last 168 hours  Urinalysis    Component Value Date/Time   COLORURINE YELLOW 04/12/2008 1552   APPEARANCEUR CLEAR 04/12/2008 1552   LABSPEC 1.015 04/12/2008 1552   PHURINE 6.0 04/12/2008 1552   GLUCOSEU NEGATIVE 04/12/2008 1552   HGBUR NEGATIVE 04/12/2008 1552   BILIRUBINUR NEGATIVE 04/12/2008 1552   KETONESUR NEGATIVE 04/12/2008 1552   PROTEINUR NEGATIVE 04/12/2008 1552   UROBILINOGEN 0.2 04/12/2008 1552   NITRITE NEGATIVE 04/12/2008 1552   LEUKOCYTESUR NEGATIVE MICROSCOPIC NOT DONE ON URINES WITH NEGATIVE PROTEIN, BLOOD, LEUKOCYTES, NITRITE, OR GLUCOSE <1000 mg/dL. 04/12/2008 1552    STUDIES: Transthoracic Echocardiography  Patient: Normajean, Nash MR #: [id number] Study Date: 04/20/2013 Gender: F Age: 83 Height: 154.9cm Weight: 74.4kg BSA: 1.39m^2 Pt. Status: Room:  PERFORMING [facility] ATTENDING Magrinat, Cicero Duck ORDERING Magrinat, Cicero Duck REFERRING Magrinat, Cicero Duck REFERRING Ardyth Gal SONOGRAPHER Jimmy Reel cc:  ------------------------------------------------------------ LV EF: 60% - 65%  ------------------------------------------------------------ Indications: V58.11 Chemotherapy Evaluation.  ------------------------------------------------------------ Study Conclusions  - Left ventricle: The cavity size was normal.  Wall thickness was normal. Systolic function was normal. The estimated ejection fraction was in the range of 60% to 65%. Wall motion was normal; there were no regional wall motion abnormalities. Features are consistent with a pseudonormal left ventricular filling pattern, with concomitant abnormal relaxation and increased filling pressure (grade 2 diastolic dysfunction).    Ct Chest W Contrast  04/26/2013   CLINICAL DATA:  Breast cancer staging, new diagnosis. The chemotherapy yet.  EXAM: CT CHEST WITH CONTRAST  TECHNIQUE: Multidetector CT imaging of the chest was performed during intravenous contrast administration.  CONTRAST:  80mL OMNIPAQUE IOHEXOL 300 MG/ML  SOLN  COMPARISON:  PET-CT scan same day  FINDINGS: Surgical clips in a left lateral breast mass noted. The left lateral breast mass measures 20 mm x 14 mm in axial dimension. Enlarged rounded abnormal left axillary lymph nodes. These lymph nodes measure up to 16 mm short axis (image 7).  No evidence of supraclavicular or infraclavicular lymphadenopathy. No internal mammary adenopathy. No mediastinal adenopathy. No pericardial fluid. Esophagus is normal.  Review of the lung parenchyma demonstrates no suspicious pulmonary nodules.  Limited view of the upper abdomen demonstrates normal adrenal glands. No focal hepatic lesion in limited examination of the liver.  Insert negative then  IMPRESSION: 1. Left lateral breast mass consistent with primary carcinoma.  2. Local nodal metastasis to the left axilla.  3.  No central nodal metastasis or pulmonary metastasis.   Electronically Signed  By: Genevive Bi M.D.   On: 04/26/2013 16:41   04/11/2013   CLINICAL DATA:  Recent diagnosis of left breast cancer with metastatic left axillary lymph node  EXAM: MR BILATERAL BREAST WITHOUT AND WITH CONTRAST  LABS:  none  TECHNIQUE: Multiplanar, multisequence MR images of both breasts were obtained prior to and following the intravenous administration of 15ml  of MultiHance.  THREE-DIMENSIONAL MR IMAGE RENDERING ON INDEPENDENT WORKSTATION:  Three-dimensional MR images were rendered by post-processing of the original MR data on an independent workstation. The three-dimensional MR images were interpreted, and findings are reported in the following complete MRI report for this study.  COMPARISON:  Previous exams  FINDINGS: Breast composition: b:  Scattered fibroglandular tissue  Background parenchymal enhancement: Mild  Right breast: No mass or abnormal enhancement.  Left breast: In the upper outer quadrant left breast, there are 3 abnormal enhancing masses encompassing an area of 6.4 cm anterior posterior dimension, 2.4 cm in diameter, 4.0 cm craniocaudal dimension. The largest mass is the most superior and posterior and was the mass previously biopsy with a clip artifact present. This mass measures 2.7cm AP, 1.4 cm cc, 1.8 cm diameter and shows washout kinetics. The 2nd mass is inferior to the biopsy mass showing washout kinetics. This mass measures 1.6 cm AP, 2.1 cm cc, 1 cm diameter. The third mass is the most anterior and smallest mass just beneath the skin. This mass measures 1.3 cm AP, 0.5 cm diameter, 0.8 cm CC showing washout kinetics.  Lymph nodes: There are abnormal enlarged lymph nodes in the left axilla consistent with patient's known left metastatic axillary lymph node.  Ancillary findings:  None.  IMPRESSION: There are 3 abnormal enhancing masses in the upper-outer quadrant of left breast encompassing a total area of the 6.4 x 2.4 x 4 cm as described. The largest, superior and posterior mass was previously biopsied proven cancer. The 2 other smaller masses seen are suspicious for neoplasm.  RECOMMENDATION: A second-look ultrasound with biopsy of the 2 smaller masses not previously biopsied in the upper outer quadrant left breast. If the masses cannot be seen on ultrasound, then MR guided core biopsy is recommended if breast conservation is desired.    Electronically Signed   By: Sherian Rein M.D.   On: 04/11/2013 11:35   Nm Pet Image Initial (pi) Skull Base To Thigh  04/26/2013   CLINICAL DATA:  Initial treatment strategy for breast carcinoma. Right breast cancer with local nodal metastasis to the axilla. Invasive ductal carcinoma.  EXAM: NUCLEAR MEDICINE PET SKULL BASE TO THIGH  FASTING BLOOD GLUCOSE:  Value:  90 mg/dl  TECHNIQUE: 40.9 mCi W-11 FDG was injected intravenously. CT data was obtained and used for attenuation correction and anatomic localization only. (This was not acquired as a diagnostic CT examination.) Additional exam technical data entered on technologist worksheet.  COMPARISON:  Chest CT 04/26/2013  FINDINGS: NECK  No hypermetabolic lymph nodes in the neck.  CHEST  There is a mildly hypermetabolic 19 mm mass in the left lateral breast with associated biopsy clip (image 73, series 2). There are intensely hypermetabolic left axillary lymph nodes with SUV max = max 17.4 measuring up to 16 mm short axis. The most superior medial lymph node metastasis is a small subpectoral is noted measuring 6 mm (image 65, series 2).  There are no hypermetabolic infraclavicular lymph nodes are internal mammary lymph nodes. There is metabolic activity associated with the right Port-A-Cath injection. No hypermetabolic right axillary lymph nodes or  breast masses.  No suspicious pulmonary nodules. No hypermetabolic mediastinal lymph nodes.  ABDOMEN/PELVIS  No abnormal hypermetabolic activity within the liver, pancreas, adrenal glands, or spleen. No hypermetabolic lymph nodes in the abdomen or pelvis.  SKELETON  No focal hypermetabolic activity to suggest skeletal metastasis.  IMPRESSION: 1.  Mildly hypermetabolic left lateral breast mass.  2. Intensely hypermetabolic nodal metastasis in the left axilla.  3. No evidence of central hypermetabolic nodal metastasis.  4. No evidence of distant metastasis within the abdomen or pelvis or skeleton.   Electronically  Signed   By: Genevive Bi M.D.   On: 04/26/2013 16:53   ASSESSMENT: 52 y.o. [place], [place] woman status post left breast biopsy 03/29/2013 for a clinical T2-T3 pN1, stage IIB-IIIA invasive ductal carcinoma, grade 2-3, estrogen receptor 39% positive (with moderate staining intensity), progesterone receptor negative, with an MIB-1 of 83%, and HER-2 amplified by CISH, with a ratio of 7.5 and 15 HER-2 copies per cell  (1) trastuzumab/ pertuzumab/ carboplatin/docetaxel started 05/01/2013, to be repeated every 3 weeks x6. The patient will receive Neulasta on day 2 at [facility] per her request.   PLAN: Desiray is finally ready to start her treatments. We again reviewed the possible toxicities, side effects and complications of the agents she is receiving. In addition to Neulasta on day 2, she will have lab work on that week after each treatment. She will have echocardiograms every 3 months while receiving anti-HER-2 treatment.  Today I wrote her a prescription for dexamethasone, lorazepam, ondansetron, and prochlorperazine and gave her written instructions on how to take these medications. I also asked her to take Claritin beginning the day of Neulasta and continuing for 2 days thereafter.  The patient will see [place] again next week. I have asked her to keep a diary so that we can best help her manage side effects as they occur. Otherwise she knows to call for any problems that may develop before the next visit here, and particularly should any fever developed.  Lowella Dell, MD   05/01/2013 1:24 PM

## 2013-04-27 NOTE — Telephone Encounter (Signed)
appts made and printed. Pt is aware that tx will follow. i emailed MW to add the tx...td

## 2013-04-27 NOTE — Telephone Encounter (Signed)
Per staff message and POF I have scheduled appts.  JMW  

## 2013-04-28 ENCOUNTER — Other Ambulatory Visit: Payer: Self-pay | Admitting: Oncology

## 2013-04-28 ENCOUNTER — Telehealth: Payer: Self-pay | Admitting: *Deleted

## 2013-04-28 NOTE — Telephone Encounter (Signed)
Faxed Treatment plan and orders to [facility] in [place] at [facility] for patient to get Day 2 Neulasta. Annice Pih asks to have chemo nurse call [phone] on Monday for time on Tuesday 10/28.

## 2013-05-01 ENCOUNTER — Encounter: Payer: Self-pay | Admitting: Oncology

## 2013-05-01 ENCOUNTER — Other Ambulatory Visit: Payer: Self-pay | Admitting: Physician Assistant

## 2013-05-01 ENCOUNTER — Telehealth: Payer: Self-pay | Admitting: *Deleted

## 2013-05-01 ENCOUNTER — Telehealth: Payer: Self-pay | Admitting: Oncology

## 2013-05-01 ENCOUNTER — Ambulatory Visit (HOSPITAL_BASED_OUTPATIENT_CLINIC_OR_DEPARTMENT_OTHER): Payer: BC Managed Care – PPO

## 2013-05-01 VITALS — BP 137/74 | HR 71 | Temp 98.8°F | Resp 20

## 2013-05-01 DIAGNOSIS — C50412 Malignant neoplasm of upper-outer quadrant of left female breast: Secondary | ICD-10-CM

## 2013-05-01 DIAGNOSIS — Z5112 Encounter for antineoplastic immunotherapy: Secondary | ICD-10-CM

## 2013-05-01 DIAGNOSIS — Z5111 Encounter for antineoplastic chemotherapy: Secondary | ICD-10-CM

## 2013-05-01 DIAGNOSIS — C50419 Malignant neoplasm of upper-outer quadrant of unspecified female breast: Secondary | ICD-10-CM

## 2013-05-01 MED ORDER — HEPARIN SOD (PORK) LOCK FLUSH 100 UNIT/ML IV SOLN
500.0000 [IU] | Freq: Once | INTRAVENOUS | Status: AC | PRN
  Administered 2013-05-01: 500 [IU]
  Filled 2013-05-01: qty 5

## 2013-05-01 MED ORDER — DEXAMETHASONE SODIUM PHOSPHATE 20 MG/5ML IJ SOLN
INTRAMUSCULAR | Status: AC
  Filled 2013-05-01: qty 5

## 2013-05-01 MED ORDER — SODIUM CHLORIDE 0.9 % IV SOLN
840.0000 mg | Freq: Once | INTRAVENOUS | Status: AC
  Administered 2013-05-01: 840 mg via INTRAVENOUS
  Filled 2013-05-01: qty 28

## 2013-05-01 MED ORDER — DEXAMETHASONE SODIUM PHOSPHATE 20 MG/5ML IJ SOLN
20.0000 mg | Freq: Once | INTRAMUSCULAR | Status: AC
  Administered 2013-05-01: 20 mg via INTRAVENOUS

## 2013-05-01 MED ORDER — ONDANSETRON 16 MG/50ML IVPB (CHCC)
16.0000 mg | Freq: Once | INTRAVENOUS | Status: AC
  Administered 2013-05-01: 16 mg via INTRAVENOUS

## 2013-05-01 MED ORDER — SODIUM CHLORIDE 0.9 % IJ SOLN
10.0000 mL | INTRAMUSCULAR | Status: DC | PRN
  Administered 2013-05-01: 10 mL
  Filled 2013-05-01: qty 10

## 2013-05-01 MED ORDER — ACETAMINOPHEN 325 MG PO TABS
ORAL_TABLET | ORAL | Status: AC
  Filled 2013-05-01: qty 2

## 2013-05-01 MED ORDER — DIPHENHYDRAMINE HCL 25 MG PO CAPS
25.0000 mg | ORAL_CAPSULE | Freq: Once | ORAL | Status: AC
  Administered 2013-05-01: 25 mg via ORAL

## 2013-05-01 MED ORDER — DIPHENHYDRAMINE HCL 25 MG PO CAPS
ORAL_CAPSULE | ORAL | Status: AC
  Filled 2013-05-01: qty 1

## 2013-05-01 MED ORDER — SODIUM CHLORIDE 0.9 % IV SOLN
Freq: Once | INTRAVENOUS | Status: AC
  Administered 2013-05-01: 09:00:00 via INTRAVENOUS

## 2013-05-01 MED ORDER — TRASTUZUMAB CHEMO INJECTION 440 MG
8.0000 mg/kg | Freq: Once | INTRAVENOUS | Status: AC
  Administered 2013-05-01: 609 mg via INTRAVENOUS
  Filled 2013-05-01: qty 29

## 2013-05-01 MED ORDER — DOCETAXEL CHEMO INJECTION 160 MG/16ML
75.0000 mg/m2 | Freq: Once | INTRAVENOUS | Status: AC
  Administered 2013-05-01: 130 mg via INTRAVENOUS
  Filled 2013-05-01: qty 13

## 2013-05-01 MED ORDER — SODIUM CHLORIDE 0.9 % IV SOLN
732.6000 mg | Freq: Once | INTRAVENOUS | Status: AC
  Administered 2013-05-01: 730 mg via INTRAVENOUS
  Filled 2013-05-01: qty 73

## 2013-05-01 MED ORDER — ACETAMINOPHEN 325 MG PO TABS
650.0000 mg | ORAL_TABLET | Freq: Once | ORAL | Status: AC
  Administered 2013-05-01: 650 mg via ORAL

## 2013-05-01 NOTE — Telephone Encounter (Signed)
Patient needs to have Neulasta arranged at [facility] due to insurance/reimbursement issues. Spoke with Mazie at [facility], patient for injection at 1pm on Tuesday. Called [place] with [facility] and cancelled previously faxed orders.

## 2013-05-01 NOTE — Progress Notes (Signed)
Enrolled pt in the Neulasta First Step program.  I faxed signed form and activated card today.  °

## 2013-05-01 NOTE — Progress Notes (Signed)
Patient observed x1 hour after first dose perjeta without complications. No acute distress noted.

## 2013-05-01 NOTE — Telephone Encounter (Signed)
Pt came by , gave her appt for lab and ML, emailed michelle regarding chemo, also called [facility] , left message regarding lab request that labs on non  chemo day pr wants to draw labs @ [facility], called [facility] RN to change order on computer since labs @ [facility] use [facility] program, pt is aware  of all appt

## 2013-05-01 NOTE — Patient Instructions (Addendum)
[facility] Discharge Instructions for Patients Receiving Chemotherapy  Today you received the following chemotherapy agents: Herceptin, Perjeta, Taxotere, Carboplatin   To help prevent nausea and vomiting after your treatment, we encourage you to take your nausea medication as prescribed. Take Dexamethasone (steroid) and zofran (anti-nausea) twice a day for 3 days starting the day after chemotherapy. Then you may take zofran as needed for nausea. You received these drugs via IV while in the infusion room on the day of your treatment.    If you develop nausea and vomiting that is not controlled by your nausea medication, call the clinic.   BELOW ARE SYMPTOMS THAT SHOULD BE REPORTED IMMEDIATELY:  *FEVER GREATER THAN 100.5 F  *CHILLS WITH OR WITHOUT FEVER  NAUSEA AND VOMITING THAT IS NOT CONTROLLED WITH YOUR NAUSEA MEDICATION  *UNUSUAL SHORTNESS OF BREATH  *UNUSUAL BRUISING OR BLEEDING  TENDERNESS IN MOUTH AND THROAT WITH OR WITHOUT PRESENCE OF ULCERS  *URINARY PROBLEMS  *BOWEL PROBLEMS  UNUSUAL RASH Items with * indicate a potential emergency and should be followed up as soon as possible.  Feel free to call the clinic you have any questions or concerns. The clinic phone number is [phone].

## 2013-05-01 NOTE — Progress Notes (Signed)
Applied to [employer] for copay assistance for Herceptin and Perjeta.  Waiting for response.

## 2013-05-02 ENCOUNTER — Telehealth: Payer: Self-pay | Admitting: *Deleted

## 2013-05-02 ENCOUNTER — Encounter (HOSPITAL_COMMUNITY): Payer: BC Managed Care – PPO | Attending: Oncology

## 2013-05-02 VITALS — BP 120/72 | HR 78

## 2013-05-02 DIAGNOSIS — C50412 Malignant neoplasm of upper-outer quadrant of left female breast: Secondary | ICD-10-CM

## 2013-05-02 DIAGNOSIS — C50419 Malignant neoplasm of upper-outer quadrant of unspecified female breast: Secondary | ICD-10-CM

## 2013-05-02 DIAGNOSIS — Z5189 Encounter for other specified aftercare: Secondary | ICD-10-CM

## 2013-05-02 MED ORDER — PEGFILGRASTIM INJECTION 6 MG/0.6ML
6.0000 mg | Freq: Once | SUBCUTANEOUS | Status: AC
  Administered 2013-05-02: 6 mg via SUBCUTANEOUS

## 2013-05-02 MED ORDER — PEGFILGRASTIM INJECTION 6 MG/0.6ML
SUBCUTANEOUS | Status: AC
  Filled 2013-05-02: qty 0.6

## 2013-05-02 NOTE — Telephone Encounter (Signed)
Per staff message and POF I have scheduled appts.  JMW  

## 2013-05-02 NOTE — Progress Notes (Signed)
Neulasta 6 mg given sub-q in right lower abd.  Tolerated well.

## 2013-05-04 ENCOUNTER — Encounter (INDEPENDENT_AMBULATORY_CARE_PROVIDER_SITE_OTHER): Payer: Self-pay | Admitting: Surgery

## 2013-05-04 ENCOUNTER — Ambulatory Visit (INDEPENDENT_AMBULATORY_CARE_PROVIDER_SITE_OTHER): Payer: BC Managed Care – PPO | Admitting: Surgery

## 2013-05-04 VITALS — BP 126/78 | HR 68 | Temp 98.0°F | Resp 18 | Ht 63.0 in | Wt 164.0 lb

## 2013-05-04 DIAGNOSIS — C50412 Malignant neoplasm of upper-outer quadrant of left female breast: Secondary | ICD-10-CM

## 2013-05-04 DIAGNOSIS — C50419 Malignant neoplasm of upper-outer quadrant of unspecified female breast: Secondary | ICD-10-CM

## 2013-05-04 NOTE — Progress Notes (Signed)
Re:   [name] [name] DOB:   20-Dec-1960 MRN:   [id number]  [facility]  ASSESSMENT AND PLAN: 1.  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 SLNBx depending on trial.  She'll see me back in 2 months.  Plan for chemotx is about 16 weeks.  1a.  Power port placed 04/21/2013 - D Sharvil Hoey  Wound looks good. 1b.  Biopsy of superior area of left breast showed fat necrosis - 04/18/2013  2.  Palpitations  Sees Dr. Elvera Lennox. Rothbart 3.  Osteoarthritis of both hips 4.  On Celebrex  To stop this one week before surgery 5.  To stop Premarin  6.  Anxiety 7.  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 Michaell Cowing for this.  A CT scan 03/27/2013 showed no direct evidence of a rectovaginal fistula.  Dr. Michaell Cowing has suggested another test to try to "document" the fistula.  That is on hold during for now as she goes through the treatment for breast cancer.  The patient will keep [place] informed about her symptoms.  REFERRING PHYSICIAN: Harlow Asa, MD  HISTORY OF PRESENT ILLNESS: [name] [name] is a 52 y.o. (DOB: Mar 29, 1961)  AA  female whose primary care physician is LUKING,W S, MD and comes for follow up of right power port placement. She had her first chemotx and did well with this. She had no questions.  I'll see her back every 2 months while she is getting chemotx.  History of breast cancer: She gets a mammogram every year.  She felt a lump in the upper outer part of her left breast.  She has no family history of breast cancer.  She takes a Premarin on a PRN basis.  It sounds like she takes no more that a few tabs a month.  She will stop this.  She had a hysterectomy for fibroids in 2010.  She had a mammogram at [facility] on 03/13/2013  which found a left breast mass.  On 03/29/2013 she had a left breast biopsy.  The biopsy (Accession: [id number]) showed IDC and metastatic carcinoma to a left axillary lymph node. MRI 04/11/2013 shows - 3 abnormal enhancing masses in the upper-outer quadrant of left breast encompassing a total area of the 6.4 x 2.4 x 4 cm as described. The largest, superior and posterior mass was previously biopsied proven cancer. The 2 other smaller masses seen are suspicious for neoplasm.  She is for biopsy of these two other areas this Friday, 10/9.   Past Medical History  Diagnosis Date  . Chest pain 2009    Consultation-Rothbart, negative chest CT; nl echo in 2005; h/o palpitations  . Palpitations   . Degenerative joint disease     + degenerative joint disease of the lumbosacral spine  . Colitis 2010    not IBD  . Herpes simplex type II infection   . Allergic rhinitis   . Hypercholesteremia     "slightly high"  . Anxiety   . Heart murmur     "small"  . GERD (gastroesophageal reflux disease)     "a little"  . Breast cancer 03/31/13    left     Past Surgical History  Procedure Laterality Date  . Colonoscopy  10/2010  proctitis; melanosis coli  . Tubal ligation    . Breast excisional biopsy  04/2003    Left; benign disease  . Right oophorectomy  03/13/2007  . Abdominal hysterectomy  03/13/2007    TAH ?BSO--Dr Cheron Every, [place]  . Eye surgery Left     "fix lazy eye"  . Portacath placement Right 04/21/2013    Procedure: INSERTION PORT-A-CATH;  Surgeon: Kandis Cocking, MD;  Location: [facility];  Service: General;  Laterality: Right;      Current Outpatient Prescriptions  Medication Sig Dispense Refill  . atenolol (TENORMIN) 50 MG tablet TAKE 1 TABLET BY MOUTH EVERY DAY FOR PALPITATION  90 tablet  1  . citalopram (CELEXA) 40 MG tablet Take 40 mg by mouth every evening.      Marland Kitchen dexamethasone (DECADRON) 4 MG tablet Take 2 tablets (8 mg total) by mouth 2 (two) times daily with a meal. Take two times  a day the day before Taxotere. Then take two times a day starting the day after chemo for 3 days.  30 tablet  1  . diphenhydramine-acetaminophen (TYLENOL PM) 25-500 MG TABS Take 2 tablets by mouth at bedtime.      . lidocaine-prilocaine (EMLA) cream Apply topically as needed.  30 g  0  . LORazepam (ATIVAN) 0.5 MG tablet Take 1 tablet (0.5 mg total) by mouth every 6 (six) hours as needed (Nausea or vomiting).  30 tablet  0  . ondansetron (ZOFRAN) 8 MG tablet Take 1 tablet (8 mg total) by mouth 2 (two) times daily. Take two times a day starting the day after chemo for 3 days. Then take two times a day as needed for nausea or vomiting.  30 tablet  1  . prochlorperazine (COMPAZINE) 10 MG tablet Take 1 tablet (10 mg total) by mouth every 6 (six) hours as needed (Nausea or vomiting).  30 tablet  1  . valACYclovir (VALTREX) 1000 MG tablet Take 1,000 mg by mouth daily.       No current facility-administered medications for this visit.      Allergies  Allergen Reactions  . Doxycycline Other (See Comments)    Unknown    REVIEW OF SYSTEMS: Cardiac:  Palpitations.  Has seen Dr. Dietrich Pates. Gastrointestinal:  Has seen Dr. Michaell Cowing for possible colo-vesicle fistula, but CT scan was negative.  Had negative colonoscopy by Dr. Karilyn Cota in 11/03/2010.  For now, her fistula evaluation and treatment are on hold. Musculoskeletal:  Osteoarthritis of both hips, on Celebrex.  She cannot remember the name of the orthopedists. Hematologic:  No bleeding disorder.  On Celebrex. Psycho-social:  The patient is oriented.   History of anxiety.  SOCIAL and FAMILY HISTORY: Married. Husband, Christiane Ha, with her. She has 4 children.  3 daughters and 1 son. Ages 77, 78, 59, 54. Works as a Passenger transport manager at [employer]  PHYSICAL EXAM: BP 126/78  Pulse 68  Temp(Src) 98 F (36.7 C)  Resp 18  Ht 5\' 3"  (1.6 m)  Wt 164 lb (74.39 kg)  BMI 29.06 kg/m2  LMP 12/04/2008  General: WN AA F who is alert and generally healthy  appearing.  Breasts:  Right - port in upper inner right breast   Left - Fullness at 2 o'clock.  Not a discreet mass.  DATA REVIEWED: Epic notes.  Ovidio Kin, MD,  [facility] [address]  [address]   [place], [place]    [address] Phone:  [phone] FAX:  [phone]

## 2013-05-05 ENCOUNTER — Telehealth: Payer: Self-pay | Admitting: Oncology

## 2013-05-05 NOTE — Telephone Encounter (Signed)
Taslked to pt and gave her appt for November 2014 lab, Ml and chemo

## 2013-05-08 ENCOUNTER — Ambulatory Visit: Payer: BC Managed Care – PPO

## 2013-05-09 ENCOUNTER — Other Ambulatory Visit (HOSPITAL_BASED_OUTPATIENT_CLINIC_OR_DEPARTMENT_OTHER): Payer: BC Managed Care – PPO | Admitting: Lab

## 2013-05-09 ENCOUNTER — Encounter: Payer: Self-pay | Admitting: Physician Assistant

## 2013-05-09 ENCOUNTER — Telehealth: Payer: Self-pay | Admitting: *Deleted

## 2013-05-09 ENCOUNTER — Ambulatory Visit: Payer: BC Managed Care – PPO

## 2013-05-09 ENCOUNTER — Ambulatory Visit (HOSPITAL_BASED_OUTPATIENT_CLINIC_OR_DEPARTMENT_OTHER): Payer: BC Managed Care – PPO | Admitting: Physician Assistant

## 2013-05-09 VITALS — BP 118/62 | HR 67 | Temp 97.4°F | Resp 18 | Ht 63.0 in | Wt 162.3 lb

## 2013-05-09 DIAGNOSIS — C50419 Malignant neoplasm of upper-outer quadrant of unspecified female breast: Secondary | ICD-10-CM

## 2013-05-09 DIAGNOSIS — C50412 Malignant neoplasm of upper-outer quadrant of left female breast: Secondary | ICD-10-CM

## 2013-05-09 LAB — COMPREHENSIVE METABOLIC PANEL (CC13)
ALT: 69 U/L — ABNORMAL HIGH (ref 0–55)
AST: 25 U/L (ref 5–34)
Albumin: 4.1 g/dL (ref 3.5–5.0)
Alkaline Phosphatase: 86 U/L (ref 40–150)
Anion Gap: 15 mEq/L — ABNORMAL HIGH (ref 3–11)
BUN: 20.1 mg/dL (ref 7.0–26.0)
CO2: 25 mEq/L (ref 22–29)
Calcium: 10 mg/dL (ref 8.4–10.4)
Chloride: 98 mEq/L (ref 98–109)
Creatinine: 0.9 mg/dL (ref 0.6–1.1)
Glucose: 79 mg/dl (ref 70–140)
Potassium: 3.8 mEq/L (ref 3.5–5.1)
Sodium: 138 mEq/L (ref 136–145)
Total Bilirubin: 0.3 mg/dL (ref 0.20–1.20)
Total Protein: 8.2 g/dL (ref 6.4–8.3)

## 2013-05-09 LAB — CBC WITH DIFFERENTIAL/PLATELET
BASO%: 0.4 % (ref 0.0–2.0)
Basophils Absolute: 0.1 10*3/uL (ref 0.0–0.1)
EOS%: 0.1 % (ref 0.0–7.0)
Eosinophils Absolute: 0 10*3/uL (ref 0.0–0.5)
HCT: 40 % (ref 34.8–46.6)
HGB: 13 g/dL (ref 11.6–15.9)
LYMPH%: 20.6 % (ref 14.0–49.7)
MCH: 26.9 pg (ref 25.1–34.0)
MCHC: 32.5 g/dL (ref 31.5–36.0)
MCV: 82.8 fL (ref 79.5–101.0)
MONO#: 3.6 10*3/uL — ABNORMAL HIGH (ref 0.1–0.9)
MONO%: 12.4 % (ref 0.0–14.0)
NEUT#: 19.3 10*3/uL — ABNORMAL HIGH (ref 1.5–6.5)
NEUT%: 66.5 % (ref 38.4–76.8)
Platelets: 250 10*3/uL (ref 145–400)
RBC: 4.83 10*6/uL (ref 3.70–5.45)
RDW: 13.7 % (ref 11.2–14.5)
WBC: 29 10*3/uL — ABNORMAL HIGH (ref 3.9–10.3)
lymph#: 6 10*3/uL — ABNORMAL HIGH (ref 0.9–3.3)

## 2013-05-09 NOTE — Progress Notes (Signed)
ID: [name] [name] OB: Nov 16, 1960  MR#: [id number]  WUJ#:[id number]  PCP: Harlow Asa, MD GYN:   Conley Simmonds, MD  SU: Ovidio Kin, MD RADONC:  Lurline Hare, MD OTHER MD: Regino Schultze, MD;  Estelle Grumbles, MD  CHIEF COMPLAINT:  Left Breast Cancer  HISTORY OF PRESENT ILLNESS: [name] [name] palpated a mass in her left breast in late August 2014. She was already scheduled for screening mammography at [facility] and this was performed 03/13/2013. Indeed a possible mass was noted in the left breast and on 03/29/2013 the patient underwent diagnostic left mammography and left ultrasonography. This showed a 2.5 cm irregular mass in the upper outer quadrant of the left breast. This was palpable.by ultrasound there was a 1.6 cm irregular hypoechoic mass in the area in question, and in addition to enlarged left axillary lymph nodes were noted, the largest measuring 2.5 cm.  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  Bilateral breast MRI 04/11/2013 shows 3 abnormal enhancing masses in the upper outer quadrant of the left breast measuring altogether 6.4 cm. The largest separate mass measured 2.7 cm. There were abnormal or large lymph nodes in the left axilla. Ultrasound guided biopsy of the 2 smaller masses has been scheduled.  The patient's subsequent history is as detailed below   INTERVAL HISTORY: [name] [name] returns alone today for followup of her breast cancer. Since her last visit here she initiated her neoadjuvant chemotherapy, and is currently day 9 cycle 1 of 6 planned q. three-week doses of docetaxel/carboplatin given with trastuzumab/pertuzumab given in the  neoadjuvant setting. She receives Neulasta on day 2 for granulocyte support, and these are given through our [facility] office per her request.  Mikiah tolerate treatment well overall. Her biggest complaints are some continuing fatigue and diarrhea which lasted for approximately 3 days last week. The diarrhea began after the chemotherapy. She had 4-5 watery stools daily for 3 days, and this then resolved without any medication. She's not having regular bowel movements. She's had no blood or mucus in the stool. She denies any abdominal pain. She also denies any fevers or chills. She's had some occasional queasiness, but no actual nausea and no emesis.   REVIEW OF SYSTEMS: [name] [name] denies any skin changes or rashes and has had no abnormal bleeding. She's had no mouth ulcers or oral sensitivity. Her appetite is fairly good. She's had no change in urinary habits. She denies any cough, shortness of breath, orthopnea, chest pain, palpitations, or peripheral swelling. She had no bony pain following Neulasta injection, and in fact denies any pain at all today. She's had no signs of numbness or tingling in the upper or lower extremities. She has occasional mild headaches, not enough to take medication.  A detailed review of systems is otherwise noncontributory.    PAST MEDICAL HISTORY: Past Medical History  Diagnosis Date  . Chest pain 2009    Consultation-Rothbart, negative chest CT; nl echo in 2005; h/o palpitations  . Palpitations   . Degenerative joint disease     + degenerative joint disease of the lumbosacral spine  . Colitis 2010    not IBD  . Herpes simplex  type II infection   . Allergic rhinitis   . Hypercholesteremia     "slightly high"  . Anxiety   . Heart murmur     "small"  . GERD (gastroesophageal reflux disease)     "a little"  . Breast cancer 03/31/13    left    PAST SURGICAL HISTORY: Past Surgical History  Procedure Laterality Date  . Colonoscopy  10/2010    proctitis;  melanosis coli  . Tubal ligation    . Breast excisional biopsy  04/2003    Left; benign disease  . Right oophorectomy  03/13/2007  . Abdominal hysterectomy  03/13/2007    TAH ?BSO--Dr Cheron Every, [place]  . Eye surgery Left     "fix lazy eye"  . Portacath placement Right 04/21/2013    Procedure: INSERTION PORT-A-CATH;  Surgeon: Kandis Cocking, MD;  Location: [facility];  Service: General;  Laterality: Right;    FAMILY HISTORY Family History  Problem Relation Age of Onset  . Aneurysm Mother     Cerebral  . Hypertension Mother   . Hyperlipidemia Mother   . Stroke Mother   . Coronary artery disease Father   . Hypertension Father   . Hyperlipidemia Father   . Lung cancer Brother   . Cancer Brother     Lung  The patient's father died at the age of 22 from a myocardial infarction. The patient's mother died at the age of 40 from a stroke. The patient had 6 brothers, 2 sisters. One brother has a history of lung cancer in the setting of tobacco abuse. Another brother had multiple myeloma. There is no history of breast or ovarian cancer in the family  GYNECOLOGIC HISTORY:  Menarche age 13, first live birth age 52, the patient is GX P4. She status post abdominal hysterectomy with bilateral salpingo-oophorectomy. She never took hormone replacement.   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 Her husband [name] works in Designer, fashion/clothing. Daughter Candida Peeling lives at home with the patient. She is not employed. Son Nautica Hotz lives in [place] and is not employed. Daughter [name] lives in [place] and is a Community education officer. Daughter [name] is a Child psychotherapist in [place]. The patient has 10 grandchildren. She attends the [employer]     ADVANCED DIRECTIVES: Not in place   HEALTH MAINTENANCE: History  Substance Use Topics  . Smoking status: Former Smoker -- 0.50 packs/day for 15 years    Types: Cigarettes     Quit date: 07/06/1982  . Smokeless tobacco: Never Used  . Alcohol Use: No     Colonoscopy:2012  PAP:2014   Bone density: Never  Lipid panel:    Allergies  Allergen Reactions  . Doxycycline Other (See Comments)    Unknown    Current Outpatient Prescriptions  Medication Sig Dispense Refill  . atenolol (TENORMIN) 50 MG tablet TAKE 1 TABLET BY MOUTH EVERY DAY FOR PALPITATION  90 tablet  1  . citalopram (CELEXA) 40 MG tablet Take 40 mg by mouth every evening.      Marland Kitchen dexamethasone (DECADRON) 4 MG tablet Take 2 tablets (8 mg total) by mouth 2 (two) times daily with a meal. Take two times a day the day before Taxotere. Then take two times a day starting the day after chemo for 3 days.  30 tablet  1  . diphenhydramine-acetaminophen (TYLENOL PM) 25-500 MG TABS Take 2 tablets by mouth at  bedtime.      . lidocaine-prilocaine (EMLA) cream Apply topically as needed.  30 g  0  . LORazepam (ATIVAN) 0.5 MG tablet Take 1 tablet (0.5 mg total) by mouth every 6 (six) hours as needed (Nausea or vomiting).  30 tablet  0  . ondansetron (ZOFRAN) 8 MG tablet Take 1 tablet (8 mg total) by mouth 2 (two) times daily. Take two times a day starting the day after chemo for 3 days. Then take two times a day as needed for nausea or vomiting.  30 tablet  1  . prochlorperazine (COMPAZINE) 10 MG tablet Take 1 tablet (10 mg total) by mouth every 6 (six) hours as needed (Nausea or vomiting).  30 tablet  1  . valACYclovir (VALTREX) 1000 MG tablet Take 1,000 mg by mouth daily.       No current facility-administered medications for this visit.    OBJECTIVE: Middle-aged [place] American woman in no acute distress Filed Vitals:   05/09/13 1417  BP: 118/62  Pulse: 67  Temp: 97.4 F (36.3 C)  Resp: 18     Body mass index is 28.76 kg/(m^2).    ECOG FS:0 - Asymptomatic Filed Weights   05/09/13 1417  Weight: 162 lb 4.8 oz (73.619 kg)   Physical Exam: HEENT:  Sclerae anicteric.  Oropharynx clear. No ulcerations.  No evidence of candidiasis.  NODES:  No cervical or supraclavicular lymphadenopathy palpated.  BREAST EXAM:  Deferred. Axillae are benign bilaterally with no palpable lymphadenopathy. LUNGS:  Clear to auscultation bilaterally.  No wheezes or rhonchi HEART:  Regular rate and rhythm.  ABDOMEN:  Soft, nontender.  Positive bowel sounds.  MSK:  No focal spinal tenderness to palpation. Full range of motion in the upper extremities. EXTREMITIES:  No peripheral edema.   SKIN:  No nailbed changes. Port is intact with no erythema, edema, or evidence of infection. NEURO:  Nonfocal. Well oriented.  Positive affect.     LAB RESULTS:   Lab Results  Component Value Date   WBC 29.0* 05/09/2013   NEUTROABS 19.3* 05/09/2013   HGB 13.0 05/09/2013   HCT 40.0 05/09/2013   MCV 82.8 05/09/2013   PLT 250 05/09/2013      Chemistry      Component Value Date/Time   NA 143 04/12/2013 0839   NA 139 06/06/2008 0545   K 3.9 04/12/2013 0839   K 3.8 06/06/2008 0545   CL 104 06/06/2008 0545   CO2 28 04/12/2013 0839   CO2 26 06/06/2008 0545   BUN 7.1 04/12/2013 0839   BUN 6 06/06/2008 0545   CREATININE 0.8 04/12/2013 0839   CREATININE 0.77 06/06/2008 0545      Component Value Date/Time   CALCIUM 9.8 04/12/2013 0839   CALCIUM 8.7 06/06/2008 0545   ALKPHOS 78 04/12/2013 0839   ALKPHOS 62 04/12/2008 1725   AST 19 04/12/2013 0839   AST 21 04/12/2008 1725   ALT 30 04/12/2013 0839   ALT 28 04/12/2008 1725   BILITOT 0.40 04/12/2013 0839   BILITOT 0.5 04/12/2008 1725       STUDIES:  Echocardiogram on 04/20/2013 showed an ejection fraction of 60-65%.     ASSESSMENT: 52 y.o. [place], [place] woman   (1)  status post left breast biopsy 03/29/2013 for a clinical T2-T3 pN1, stage IIB-IIIA invasive ductal carcinoma, grade 2-3, estrogen receptor 39% positive (with moderate staining intensity), progesterone receptor negative, with an MIB-1 of 83%, and HER-2 amplified by CISH, with a ratio of 7.5 and 15 HER-2  copies per cell  (2)  receiving neoadjuvant chemotherapy, with trastuzumab/ pertuzumab/ carboplatin/docetaxel started 05/01/2013, to be repeated every 3 weeks x6. The patient will receive Neulasta on day 2 at [facility] per her request.   PLAN: Anel  tolerated her first cycle of chemotherapy quite well. We did discuss the diarrhea and instructed her to try one tablet of Imodium after every 2 watery stools if this occurs again. If it does not resolve within 24 hours she should contact her office to let us know. I encouraged her to keep herself very well hydrated as well.  I will see the patient back in 2 weeks on November 17 for repeat labs and physical exam prior to day 1 cycle 2 of chemotherapy. We reviewed her scheduled through early January, and we'll get her appointment scheduled to get her through the holiday season.  Cashlyn voices understanding and agreement with this plan, and will call with any changes or problems.    Kimmie Doren, PA-C   05/09/2013 2:40 PM

## 2013-05-09 NOTE — Telephone Encounter (Signed)
appts made and printed. Pt is aware that i lm w/ Mazie @ [facility] to get her injs. Pt is also aware that her tx's will be  Adjusted and added. i sent MW an email....td

## 2013-05-11 ENCOUNTER — Other Ambulatory Visit: Payer: Self-pay

## 2013-05-15 ENCOUNTER — Ambulatory Visit: Payer: BC Managed Care – PPO

## 2013-05-18 ENCOUNTER — Ambulatory Visit (INDEPENDENT_AMBULATORY_CARE_PROVIDER_SITE_OTHER): Payer: BC Managed Care – PPO | Admitting: Nurse Practitioner

## 2013-05-18 ENCOUNTER — Encounter: Payer: Self-pay | Admitting: Nurse Practitioner

## 2013-05-18 VITALS — BP 128/70 | Ht 61.0 in | Wt 169.4 lb

## 2013-05-18 DIAGNOSIS — F411 Generalized anxiety disorder: Secondary | ICD-10-CM

## 2013-05-18 DIAGNOSIS — R4589 Other symptoms and signs involving emotional state: Secondary | ICD-10-CM

## 2013-05-18 MED ORDER — ALPRAZOLAM 1 MG PO TABS: ORAL_TABLET | ORAL | Status: DC

## 2013-05-22 ENCOUNTER — Ambulatory Visit (HOSPITAL_BASED_OUTPATIENT_CLINIC_OR_DEPARTMENT_OTHER): Payer: BC Managed Care – PPO | Admitting: Physician Assistant

## 2013-05-22 ENCOUNTER — Encounter: Payer: Self-pay | Admitting: Oncology

## 2013-05-22 ENCOUNTER — Ambulatory Visit (HOSPITAL_BASED_OUTPATIENT_CLINIC_OR_DEPARTMENT_OTHER): Payer: BC Managed Care – PPO

## 2013-05-22 ENCOUNTER — Ambulatory Visit (HOSPITAL_BASED_OUTPATIENT_CLINIC_OR_DEPARTMENT_OTHER): Payer: BC Managed Care – PPO | Admitting: Lab

## 2013-05-22 ENCOUNTER — Encounter: Payer: Self-pay | Admitting: Physician Assistant

## 2013-05-22 ENCOUNTER — Other Ambulatory Visit: Payer: Self-pay | Admitting: Oncology

## 2013-05-22 ENCOUNTER — Encounter: Payer: Self-pay | Admitting: Nurse Practitioner

## 2013-05-22 ENCOUNTER — Encounter: Payer: Self-pay | Admitting: *Deleted

## 2013-05-22 ENCOUNTER — Other Ambulatory Visit: Payer: BC Managed Care – PPO | Admitting: Lab

## 2013-05-22 VITALS — BP 116/67 | HR 70 | Temp 98.6°F | Resp 18 | Ht 61.0 in | Wt 168.3 lb

## 2013-05-22 DIAGNOSIS — C50419 Malignant neoplasm of upper-outer quadrant of unspecified female breast: Secondary | ICD-10-CM

## 2013-05-22 DIAGNOSIS — Z170 Estrogen receptor positive status [ER+]: Secondary | ICD-10-CM

## 2013-05-22 DIAGNOSIS — L708 Other acne: Secondary | ICD-10-CM

## 2013-05-22 DIAGNOSIS — F411 Generalized anxiety disorder: Secondary | ICD-10-CM

## 2013-05-22 DIAGNOSIS — C50412 Malignant neoplasm of upper-outer quadrant of left female breast: Secondary | ICD-10-CM

## 2013-05-22 DIAGNOSIS — R197 Diarrhea, unspecified: Secondary | ICD-10-CM | POA: Insufficient documentation

## 2013-05-22 DIAGNOSIS — R0989 Other specified symptoms and signs involving the circulatory and respiratory systems: Secondary | ICD-10-CM

## 2013-05-22 DIAGNOSIS — Z5111 Encounter for antineoplastic chemotherapy: Secondary | ICD-10-CM

## 2013-05-22 DIAGNOSIS — R0609 Other forms of dyspnea: Secondary | ICD-10-CM

## 2013-05-22 DIAGNOSIS — R21 Rash and other nonspecific skin eruption: Secondary | ICD-10-CM

## 2013-05-22 DIAGNOSIS — Z5112 Encounter for antineoplastic immunotherapy: Secondary | ICD-10-CM

## 2013-05-22 LAB — CBC WITH DIFFERENTIAL/PLATELET
BASO%: 0.3 % (ref 0.0–2.0)
Basophils Absolute: 0 10*3/uL (ref 0.0–0.1)
EOS%: 0 % (ref 0.0–7.0)
Eosinophils Absolute: 0 10*3/uL (ref 0.0–0.5)
HCT: 32.5 % — ABNORMAL LOW (ref 34.8–46.6)
HGB: 10.7 g/dL — ABNORMAL LOW (ref 11.6–15.9)
LYMPH%: 20.7 % (ref 14.0–49.7)
MCH: 27.5 pg (ref 25.1–34.0)
MCHC: 32.9 g/dL (ref 31.5–36.0)
MCV: 83.5 fL (ref 79.5–101.0)
MONO#: 0.9 10*3/uL (ref 0.1–0.9)
MONO%: 9.4 % (ref 0.0–14.0)
NEUT#: 6.9 10*3/uL — ABNORMAL HIGH (ref 1.5–6.5)
NEUT%: 69.6 % (ref 38.4–76.8)
Platelets: 227 10*3/uL (ref 145–400)
RBC: 3.89 10*6/uL (ref 3.70–5.45)
RDW: 14.6 % — ABNORMAL HIGH (ref 11.2–14.5)
WBC: 10 10*3/uL (ref 3.9–10.3)
lymph#: 2.1 10*3/uL (ref 0.9–3.3)
nRBC: 0 % (ref 0–0)

## 2013-05-22 MED ORDER — DEXAMETHASONE SODIUM PHOSPHATE 20 MG/5ML IJ SOLN
INTRAMUSCULAR | Status: AC
  Filled 2013-05-22: qty 5

## 2013-05-22 MED ORDER — DOCETAXEL CHEMO INJECTION 160 MG/16ML
75.0000 mg/m2 | Freq: Once | INTRAVENOUS | Status: AC
  Administered 2013-05-22: 130 mg via INTRAVENOUS
  Filled 2013-05-22: qty 13

## 2013-05-22 MED ORDER — ACETAMINOPHEN 325 MG PO TABS
650.0000 mg | ORAL_TABLET | Freq: Once | ORAL | Status: AC
  Administered 2013-05-22: 650 mg via ORAL

## 2013-05-22 MED ORDER — ACETAMINOPHEN 325 MG PO TABS
ORAL_TABLET | ORAL | Status: AC
  Filled 2013-05-22: qty 2

## 2013-05-22 MED ORDER — DIPHENHYDRAMINE HCL 25 MG PO CAPS
ORAL_CAPSULE | ORAL | Status: AC
  Filled 2013-05-22: qty 2

## 2013-05-22 MED ORDER — CLINDAMYCIN PHOSPHATE 1 % EX GEL: Freq: Two times a day (BID) | CUTANEOUS | Status: DC

## 2013-05-22 MED ORDER — DIPHENHYDRAMINE HCL 25 MG PO CAPS
25.0000 mg | ORAL_CAPSULE | Freq: Once | ORAL | Status: AC
Start: 2013-05-22 — End: 2013-05-22
  Administered 2013-05-22: 25 mg via ORAL

## 2013-05-22 MED ORDER — ONDANSETRON 16 MG/50ML IVPB (CHCC)
INTRAVENOUS | Status: AC
  Filled 2013-05-22: qty 16

## 2013-05-22 MED ORDER — CHOLESTYRAMINE 4 G PO PACK: 4.0000 g | PACK | Freq: Two times a day (BID) | ORAL | Status: DC | PRN

## 2013-05-22 MED ORDER — SODIUM CHLORIDE 0.9 % IV SOLN
Freq: Once | INTRAVENOUS | Status: AC
  Administered 2013-05-22: 10:00:00 via INTRAVENOUS

## 2013-05-22 MED ORDER — SODIUM CHLORIDE 0.9 % IJ SOLN
10.0000 mL | INTRAMUSCULAR | Status: DC | PRN
Start: 2013-05-22 — End: 2013-05-22
  Administered 2013-05-22: 10 mL
  Filled 2013-05-22: qty 10

## 2013-05-22 MED ORDER — DEXAMETHASONE SODIUM PHOSPHATE 20 MG/5ML IJ SOLN
20.0000 mg | Freq: Once | INTRAMUSCULAR | Status: AC
  Administered 2013-05-22: 20 mg via INTRAVENOUS

## 2013-05-22 MED ORDER — SODIUM CHLORIDE 0.9 % IV SOLN
420.0000 mg | Freq: Once | INTRAVENOUS | Status: AC
  Administered 2013-05-22: 420 mg via INTRAVENOUS
  Filled 2013-05-22: qty 14

## 2013-05-22 MED ORDER — ONDANSETRON 16 MG/50ML IVPB (CHCC)
16.0000 mg | Freq: Once | INTRAVENOUS | Status: AC
  Administered 2013-05-22: 16 mg via INTRAVENOUS

## 2013-05-22 MED ORDER — TRASTUZUMAB CHEMO INJECTION 440 MG
6.0000 mg/kg | Freq: Once | INTRAVENOUS | Status: AC
  Administered 2013-05-22: 462 mg via INTRAVENOUS
  Filled 2013-05-22: qty 22

## 2013-05-22 MED ORDER — HEPARIN SOD (PORK) LOCK FLUSH 100 UNIT/ML IV SOLN
500.0000 [IU] | Freq: Once | INTRAVENOUS | Status: AC | PRN
  Administered 2013-05-22: 500 [IU]
  Filled 2013-05-22: qty 5

## 2013-05-22 MED ORDER — SODIUM CHLORIDE 0.9 % IV SOLN
732.6000 mg | Freq: Once | INTRAVENOUS | Status: AC
  Administered 2013-05-22: 730 mg via INTRAVENOUS
  Filled 2013-05-22: qty 73

## 2013-05-22 NOTE — Progress Notes (Signed)
Mailed after appt letter to pt. 

## 2013-05-22 NOTE — Assessment & Plan Note (Signed)
Use Xanax during the day for stress. Do not take within 4 hours of Ativan. Continue other meds as directed. Call back if no improvement. Otherwise, recheck in 3 months.

## 2013-05-22 NOTE — Progress Notes (Signed)
Pt is approved for a copay card for Perjeta for $24,000 from 05/01/13 - 04/30/14.  I forwarded the approval letter to Peyton Najjar with the Billing dept.

## 2013-05-22 NOTE — Patient Instructions (Signed)
[facility] Discharge Instructions for Patients Receiving Chemotherapy  Today you received the following chemotherapy agents: Taxotere, Carboplatin, Perjeta, Herceptin  To help prevent nausea and vomiting after your treatment, we encourage you to take your nausea medication.   If you develop nausea and vomiting that is not controlled by your nausea medication, call the clinic.   BELOW ARE SYMPTOMS THAT SHOULD BE REPORTED IMMEDIATELY:  *FEVER GREATER THAN 100.5 F  *CHILLS WITH OR WITHOUT FEVER  NAUSEA AND VOMITING THAT IS NOT CONTROLLED WITH YOUR NAUSEA MEDICATION  *UNUSUAL SHORTNESS OF BREATH  *UNUSUAL BRUISING OR BLEEDING  TENDERNESS IN MOUTH AND THROAT WITH OR WITHOUT PRESENCE OF ULCERS  *URINARY PROBLEMS  *BOWEL PROBLEMS  UNUSUAL RASH Items with * indicate a potential emergency and should be followed up as soon as possible.  Feel free to call the clinic you have any questions or concerns. The clinic phone number is [phone].

## 2013-05-22 NOTE — Progress Notes (Signed)
ID: [name] OB: 01/17/1961  MR#: [id number]  WUJ#:[id number]  PCP: [name] Asa, MD ; [name] Don, NP GYN:   [name] Simmonds, MD  SU: [name] Kin, MD RADONC:  [name] Hare, MD OTHER MD: [name] Schultze, MD;  [name] Grumbles, MD  CHIEF COMPLAINT:  Left Breast Cancer  HISTORY OF PRESENT ILLNESS: [name] [name] palpated a mass in her left breast in late August 2014. She was already scheduled for screening mammography at [facility] and this was performed 03/13/2013. Indeed a possible mass was noted in the left breast and on 03/29/2013 the patient underwent diagnostic left mammography and left ultrasonography. This showed a 2.5 cm irregular mass in the upper outer quadrant of the left breast. This was palpable.by ultrasound there was a 1.6 cm irregular hypoechoic mass in the area in question, and in addition to enlarged left axillary lymph nodes were noted, the largest measuring 2.5 cm.  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  Bilateral breast MRI 04/11/2013 shows 3 abnormal enhancing masses in the upper outer quadrant of the left breast measuring altogether 6.4 cm. The largest separate mass measured 2.7 cm. There were abnormal or large lymph nodes in the left axilla. Ultrasound guided biopsy of the 2 smaller masses has been scheduled.  The patient's subsequent history is as detailed below   INTERVAL HISTORY: [name] [name] returns alone today for followup of her breast cancer. She is due today for her second of 6 planned q. three-week cycles of  docetaxel/carboplatin given with trastuzumab/pertuzumab given in the neoadjuvant setting. She receives Neulasta on day  2 for granulocyte support, and these are given through our [facility] office per her request.  [name] [name] is feeling reasonably well today. She has continued to have some loose bowel movements, and developed diarrhea following cycle 1. She has tried Lomotil with some relief. She's had no blood or mucus in the stool, and denies any abdominal pain. She had only one isolated episode of fever, the highest reported temp being 101 approximately 10 days ago. This resolved, and she's had no elevated temps since that time.   She has lost most of her hair, and her scalp is still a little bit sore. She has some shortness of breath with exertion which is a little more pronounced this week. She developed acne on the face when taking the dexamethasone, but denies any additional skin changes or rashes elsewhere. Fortunately, she continues to deny any signs of peripheral neuropathy, with no numbness or tingling in either the upper or lower extremities.    REVIEW OF SYSTEMS: Otherwise [name] [name] has had no chills or night sweats. She denies any abnormal bruising or bleeding, other than some slight blood in the mucus when she blows her nose. She's had no actual epistasis. She's had no mouth ulcers or oral sensitivity. Her appetite is good, and she denies any problems with nausea or emesis. . She's had no change in urinary habits. She denies any cough,  phlegm production,  orthopnea, chest pain, palpitations, or peripheral swelling.  she currently denies any unusual myalgias, arthralgias, or bony pain, and also denies any abnormal headaches other than those apparently associated with sinus  congestion. She denies any dizziness or change in vision.  A detailed review of systems is otherwise noncontributory.    PAST MEDICAL HISTORY: Past Medical History  Diagnosis Date  . Chest pain 2009    Consultation-[name] [name], negative chest CT; nl echo in 2005; h/o palpitations  . Palpitations   . Degenerative joint disease     +  degenerative joint disease of the lumbosacral spine  . Colitis 2010    not IBD  . Herpes simplex type II infection   . Allergic rhinitis   . Hypercholesteremia     "slightly high"  . Anxiety   . Heart murmur     "small"  . GERD (gastroesophageal reflux disease)     "a little"  . Breast cancer 03/31/13    left    PAST SURGICAL HISTORY: Past Surgical History  Procedure Laterality Date  . Colonoscopy  10/2010    proctitis; melanosis coli  . Tubal ligation    . Breast excisional biopsy  04/2003    Left; benign disease  . Right oophorectomy  03/13/2007  . Abdominal hysterectomy  03/13/2007    TAH ?BSO--Dr [name] [name], [place]  . Eye surgery Left     "fix lazy eye"  . Portacath placement Right 04/21/2013    Procedure: INSERTION PORT-A-CATH;  Surgeon: [name] Cocking, MD;  Location: [facility];  Service: General;  Laterality: Right;    FAMILY HISTORY Family History  Problem Relation Age of Onset  . Aneurysm Mother     Cerebral  . Hypertension Mother   . Hyperlipidemia Mother   . Stroke Mother   . Coronary artery disease Father   . Hypertension Father   . Hyperlipidemia Father   . Lung cancer Brother   . Cancer Brother     Lung  The patient's father died at the age of 71 from a myocardial infarction. The patient's mother died at the age of 7 from a stroke. The patient had 6 brothers, 2 sisters. One brother has a history of lung cancer in the setting of tobacco abuse. Another brother had multiple myeloma. There is no history of breast or ovarian cancer in the family  GYNECOLOGIC HISTORY:  Menarche age 7, first live birth age 18, the patient is GX P4. She status post abdominal hysterectomy with bilateral salpingo-oophorectomy. She never took hormone replacement.   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 Her husband [name] [name] works in Designer, fashion/clothing. Daughter [name] [name] lives at home with the patient. She is not employed. Son [name] [name] lives in [place] and is not employed. Daughter [name] [name] lives in [place] and is a Community education officer. Daughter [name] [name] is a Child psychotherapist in [place]. The patient has 10 grandchildren. She attends [employer]     ADVANCED DIRECTIVES: Not in place   HEALTH MAINTENANCE:  (Updated 05/22/2013)  History  Substance Use Topics  . Smoking status: Former Smoker -- 0.50 packs/day for 15 years    Types: Cigarettes    Quit date: 07/06/1982  . Smokeless tobacco: Never Used  . Alcohol Use: No     Colonoscopy:2012  PAP:2014   Bone density: Never  Lipid panel:    Allergies  Allergen Reactions  . Doxycycline Other (See Comments)    Unknown    Current Outpatient Prescriptions  Medication Sig Dispense Refill  . ALPRAZolam (XANAX) 1 MG tablet 1 po TID prn anxiety  90 tablet  0  .  atenolol (TENORMIN) 50 MG tablet TAKE 1 TABLET BY MOUTH [name] DAY FOR PALPITATION  90 tablet  1  . citalopram (CELEXA) 40 MG tablet Take 40 mg by mouth [name] evening.      Marland Kitchen dexamethasone (DECADRON) 4 MG tablet Take 2 tablets (8 mg total) by mouth 2 (two) times daily with a meal. Take two times a day the day before Taxotere. Then take two times a day starting the day after chemo for 3 days.  30 tablet  1  . diphenhydramine-acetaminophen (TYLENOL PM) 25-500 MG TABS Take 2 tablets by mouth at bedtime.      . lidocaine-prilocaine (EMLA) cream Apply topically as needed.  30 g  0  . LORazepam (ATIVAN) 0.5 MG tablet Take 1 tablet (0.5 mg total) by mouth [name] 6 (six) hours as needed (Nausea or vomiting).  30 tablet  0  . ondansetron (ZOFRAN) 8 MG tablet Take 1 tablet (8 mg total) by mouth 2 (two) times daily. Take two times a day starting the day after chemo for 3 days. Then take two times a day as needed for nausea or vomiting.  30 tablet  1  . prochlorperazine (COMPAZINE) 10 MG tablet Take 1 tablet (10 mg total) by mouth [name] 6 (six) hours as needed (Nausea or vomiting).   30 tablet  1  . valACYclovir (VALTREX) 1000 MG tablet Take 1,000 mg by mouth daily.      . cholestyramine (QUESTRAN) 4 G packet Take 1 packet (4 g total) by mouth 2 (two) times daily as needed (diarrhea).  30 each  1  . clindamycin (CLINDAGEL) 1 % gel Apply topically 2 (two) times daily.  30 g  1   No current facility-administered medications for this visit.    OBJECTIVE: Middle-aged [place] American woman who appears comfortable and is in no acute distress Filed Vitals:   05/22/13 0832  BP: 116/67  Pulse: 70  Temp: 98.6 F (37 C)  Resp: 18     Body mass index is 31.82 kg/(m^2).    ECOG FS:1 Filed Weights   05/22/13 0832  Weight: 168 lb 4.8 oz (76.34 kg)   Physical Exam: HEENT:  Sclerae anicteric.  Oropharynx clear. No ulcerations. No evidence of candidiasis. Buccal mucosa is pink and moist.  NODES:  No cervical or supraclavicular lymphadenopathy palpated.  BREAST EXAM:  Right breast is unremarkable. There is a palpable thickening measuring approximately 2.5 cm in the upper outer quadrant of the left breast. Axillae are benign bilaterally with no palpable lymphadenopathy. LUNGS:  Clear to auscultation bilaterally.  No wheezes or rhonchi HEART:  Regular rate and rhythm. Soft systolic murmur  ABDOMEN:  Soft, nontender.  Positive bowel sounds.  MSK:  No focal spinal tenderness to palpation. Full range of motion in the upper extremities. EXTREMITIES:  No peripheral edema.   SKIN:   mild acneform rash on the face, primarily around the nose and mouth. No nailbed changes. Port is intact with no erythema, edema, or evidence of infection. NEURO:  Nonfocal. Well oriented.  Positive affect.     LAB RESULTS:   Lab Results  Component Value Date   WBC 10.0 05/22/2013   NEUTROABS 6.9* 05/22/2013   HGB 10.7* 05/22/2013   HCT 32.5* 05/22/2013   MCV 83.5 05/22/2013   PLT 227 05/22/2013      Chemistry      Component Value Date/Time   NA 138 05/09/2013 1402   NA 139 06/06/2008 0545    K 3.8 05/09/2013 1402  K 3.8 06/06/2008 0545   CL 104 06/06/2008 0545   CO2 25 05/09/2013 1402   CO2 26 06/06/2008 0545   BUN 20.1 05/09/2013 1402   BUN 6 06/06/2008 0545   CREATININE 0.9 05/09/2013 1402   CREATININE 0.77 06/06/2008 0545      Component Value Date/Time   CALCIUM 10.0 05/09/2013 1402   CALCIUM 8.7 06/06/2008 0545   ALKPHOS 86 05/09/2013 1402   ALKPHOS 62 04/12/2008 1725   AST 25 05/09/2013 1402   AST 21 04/12/2008 1725   ALT 69* 05/09/2013 1402   ALT 28 04/12/2008 1725   BILITOT 0.30 05/09/2013 1402   BILITOT 0.5 04/12/2008 1725       STUDIES:  Echocardiogram on 04/20/2013 showed an ejection fraction of 60-65%.     ASSESSMENT: 52 y.o. [name] [name], [place] woman   (1)  status post left breast biopsy 03/29/2013 for a clinical T2-T3 pN1, stage IIB-IIIA invasive ductal carcinoma, grade 2-3, estrogen receptor 39% positive (with moderate staining intensity), progesterone receptor negative, with an MIB-1 of 83%, and HER-2 amplified by CISH, with a ratio of 7.5 and 15 HER-2 copies per cell  (2) receiving neoadjuvant chemotherapy, with trastuzumab/ pertuzumab/ carboplatin/docetaxel started 05/01/2013, to be repeated [name] 3 weeks x6. The patient will receive Neulasta on day 2 at [facility] per her request.     PLAN: Marrianne will proceed to treatment today as scheduled for day 1 cycle 2 of docetaxel/carboplatin given along with trastuzumab/pertuzumab.  She scheduled for her Neulasta injection at [facility], November 18, and I will see her next week on November 25 for assessment of chemotoxicity. Of note, she is due for her next echo in mid January.  I am prescribing clindamycin 1% gel to apply to acneform rash up to twice daily as needed. This rash is likely secondary to the oral dexamethasone. I've also given her prescription for Questran powder to use up to twice daily as needed for diarrhea, but I have asked her contact us if the diarrhea worsens. Certainly, she also  understands to contact us with any fevers of 100.3 or above. She will again utilize Claritin following the Neulasta injection, but might want to try Claritin-D for some mild sinus congestion which is likely contributing to her headaches.    I have given Sarena all of this information in writing today, and she voices her understanding and agreement with the above. She knows to call prior to her appointment next week with any changes or problems.  I will mention that Braeleigh was having some increased anxiety and was prescribed Xanax by her primary care physician. She does use lorazepam, but only in the evenings following chemotherapy, and was advised not to use these 2 medications together.   [name] Winterhalter, PA-C   05/22/2013 9:34 AM

## 2013-05-22 NOTE — Progress Notes (Signed)
Subjective:  Presents complaints of a flareup of her anxiety due to situational stress. Patient is undergoing chemotherapy for breast cancer. Having some anxiety symptoms during the day. Takes lorazepam rarely for nausea vomiting, does not seem to help her anxiety. Denies suicidal thoughts or ideation. Overall positive outlook on her condition. Her employer has been working with her about missing work.  Objective:   NAD. Alert, oriented. Cheerful affect. Lungs clear. Heart RRR.   Assessment:Anxiety as acute reaction to exceptional stress  Chronic depression  Plan: Meds ordered this encounter  Medications  . ALPRAZolam (XANAX) 1 MG tablet    Sig: 1 po TID prn anxiety    Dispense:  90 tablet    Refill:  0    Order Specific Question:  Supervising Provider    Answer:  Merlyn Albert [[id number]]   Use Xanax during the day for stress. Do not take within 4 hours of Ativan. Continue other meds as directed. Call back if no improvement. Otherwise, recheck in 3 months.

## 2013-05-23 ENCOUNTER — Encounter (HOSPITAL_COMMUNITY): Payer: BC Managed Care – PPO | Attending: Oncology

## 2013-05-23 ENCOUNTER — Telehealth: Payer: Self-pay

## 2013-05-23 DIAGNOSIS — C50419 Malignant neoplasm of upper-outer quadrant of unspecified female breast: Secondary | ICD-10-CM | POA: Insufficient documentation

## 2013-05-23 DIAGNOSIS — C50412 Malignant neoplasm of upper-outer quadrant of left female breast: Secondary | ICD-10-CM

## 2013-05-23 DIAGNOSIS — Z5189 Encounter for other specified aftercare: Secondary | ICD-10-CM

## 2013-05-23 MED ORDER — PEGFILGRASTIM INJECTION 6 MG/0.6ML
6.0000 mg | Freq: Once | SUBCUTANEOUS | Status: AC
  Administered 2013-05-23: 6 mg via SUBCUTANEOUS

## 2013-05-23 MED ORDER — PEGFILGRASTIM INJECTION 6 MG/0.6ML
SUBCUTANEOUS | Status: AC
  Filled 2013-05-23: qty 0.6

## 2013-05-23 NOTE — Progress Notes (Signed)
[name] [name] presents today for injection per MD orders. Neulasta 6mg  administered SQ in right Abdomen. Administration without incident. Patient tolerated well.

## 2013-05-23 NOTE — Telephone Encounter (Signed)
Spoke with Ms. Ostrom and told her that this nurse confirmed with the pharmacy and mar from 05-22-13 and she did receive the Projeta.  It did not print on the after visit summary. Patient satisfied with this information. Reviewed with the patient that she can call the main office number and ask to speak with Dr. Darrall Dears nurse if she has questions so she does not have to come to the office directly.  Pt. Verbalized understanding.

## 2013-05-24 ENCOUNTER — Other Ambulatory Visit: Payer: Self-pay | Admitting: Certified Registered Nurse Anesthetist

## 2013-05-30 ENCOUNTER — Encounter: Payer: Self-pay | Admitting: Physician Assistant

## 2013-05-30 ENCOUNTER — Telehealth: Payer: Self-pay | Admitting: Oncology

## 2013-05-30 ENCOUNTER — Ambulatory Visit (HOSPITAL_BASED_OUTPATIENT_CLINIC_OR_DEPARTMENT_OTHER): Payer: BC Managed Care – PPO | Admitting: Physician Assistant

## 2013-05-30 ENCOUNTER — Other Ambulatory Visit (HOSPITAL_BASED_OUTPATIENT_CLINIC_OR_DEPARTMENT_OTHER): Payer: BC Managed Care – PPO | Admitting: Lab

## 2013-05-30 VITALS — BP 108/70 | HR 74 | Temp 98.6°F | Resp 18 | Ht 61.0 in | Wt 162.9 lb

## 2013-05-30 DIAGNOSIS — C50419 Malignant neoplasm of upper-outer quadrant of unspecified female breast: Secondary | ICD-10-CM

## 2013-05-30 DIAGNOSIS — C50412 Malignant neoplasm of upper-outer quadrant of left female breast: Secondary | ICD-10-CM

## 2013-05-30 DIAGNOSIS — R198 Other specified symptoms and signs involving the digestive system and abdomen: Secondary | ICD-10-CM

## 2013-05-30 DIAGNOSIS — D649 Anemia, unspecified: Secondary | ICD-10-CM | POA: Insufficient documentation

## 2013-05-30 HISTORY — DX: Anemia, unspecified: D64.9

## 2013-05-30 LAB — CBC WITH DIFFERENTIAL/PLATELET
BASO%: 0.4 % (ref 0.0–2.0)
Basophils Absolute: 0 10*3/uL (ref 0.0–0.1)
EOS%: 0.1 % (ref 0.0–7.0)
Eosinophils Absolute: 0 10*3/uL (ref 0.0–0.5)
HCT: 34.3 % — ABNORMAL LOW (ref 34.8–46.6)
HGB: 11.4 g/dL — ABNORMAL LOW (ref 11.6–15.9)
LYMPH%: 26.7 % (ref 14.0–49.7)
MCH: 27.6 pg (ref 25.1–34.0)
MCHC: 33.1 g/dL (ref 31.5–36.0)
MCV: 83.5 fL (ref 79.5–101.0)
MONO#: 0.7 10*3/uL (ref 0.1–0.9)
MONO%: 8.8 % (ref 0.0–14.0)
NEUT#: 5.3 10*3/uL (ref 1.5–6.5)
NEUT%: 64 % (ref 38.4–76.8)
Platelets: 196 10*3/uL (ref 145–400)
RBC: 4.11 10*6/uL (ref 3.70–5.45)
RDW: 14.5 % (ref 11.2–14.5)
WBC: 8.3 10*3/uL (ref 3.9–10.3)
lymph#: 2.2 10*3/uL (ref 0.9–3.3)

## 2013-05-30 LAB — COMPREHENSIVE METABOLIC PANEL (CC13)
ALT: 63 U/L — ABNORMAL HIGH (ref 0–55)
AST: 23 U/L (ref 5–34)
Albumin: 3.8 g/dL (ref 3.5–5.0)
Alkaline Phosphatase: 89 U/L (ref 40–150)
Anion Gap: 11 mEq/L (ref 3–11)
BUN: 12.2 mg/dL (ref 7.0–26.0)
CO2: 25 mEq/L (ref 22–29)
Calcium: 9.4 mg/dL (ref 8.4–10.4)
Chloride: 104 mEq/L (ref 98–109)
Creatinine: 0.9 mg/dL (ref 0.6–1.1)
Glucose: 97 mg/dl (ref 70–140)
Potassium: 3.5 mEq/L (ref 3.5–5.1)
Sodium: 140 mEq/L (ref 136–145)
Total Bilirubin: 0.47 mg/dL (ref 0.20–1.20)
Total Protein: 7.3 g/dL (ref 6.4–8.3)

## 2013-05-30 NOTE — Progress Notes (Signed)
ID: [name] OB: 1961-06-13  MR#: [id number]  WGN#:[id number]  PCP: Harlow Asa, MD ; Sherie Don, NP GYN:   Conley Simmonds, MD  SU: Ovidio Kin, MD RADONC:  Lurline Hare, MD OTHER MD: Regino Schultze, MD;  Estelle Grumbles, MD  CHIEF COMPLAINT:  Left Breast Cancer/Neoadjuvant Chemo  HISTORY OF PRESENT ILLNESS: [name] [name] palpated a mass in her left breast in late August 2014. She was already scheduled for screening mammography at [facility] and this was performed 03/13/2013. Indeed a possible mass was noted in the left breast and on 03/29/2013 the patient underwent diagnostic left mammography and left ultrasonography. This showed a 2.5 cm irregular mass in the upper outer quadrant of the left breast. This was palpable.by ultrasound there was a 1.6 cm irregular hypoechoic mass in the area in question, and in addition to enlarged left axillary lymph nodes were noted, the largest measuring 2.5 cm.  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  Bilateral breast MRI 04/11/2013 shows 3 abnormal enhancing masses in the upper outer quadrant of the left breast measuring altogether 6.4 cm. The largest separate mass measured 2.7 cm. There were abnormal or large lymph nodes in the left axilla. Ultrasound guided biopsy of the 2 smaller masses has been scheduled.  The patient's subsequent history is as detailed below   INTERVAL HISTORY: [name] [name] returns alone today for followup of her locally advanced left breast cancer. She is currently day 9 cycle 2 of 6 planned q. three-week cycles of  docetaxel/carboplatin given with trastuzumab/pertuzumab given in the  neoadjuvant setting. She receives Neulasta on day 2 for granulocyte support, and these are given through our [facility] office per her request.  [name] [name] is recovering slowly from cycle 2, and found a second cycle of little harder to tolerate. She's had bowel changes, and tells me that would in the same day she can go from constipated to having diarrhea. This is of course a little hard to manage. She's afraid that if she takes stool softeners the diarrhea will worsen, and if she takes anything for the diarrhea, it would cause the constipation to worsen. She also has a history of a vaginal fistula and this is causing some subsequent irritation. She denies any abnormal bleeding and no evidence of blood in the stool. She's had no excessive abdominal cramping. She is trying to keep herself well hydrated with water, and is eating a somewhat bland diet.  Otherwise [name] [name] feels tired. She has some shortness of breath with exertion but denies any orthopnea or cough. She has had no chest pain or palpitations and denies any peripheral swelling.  REVIEW OF SYSTEMS: [name] [name] has had no fevers, chills or night sweats. She denies any skin changes or rashes. She's had no mouth ulcers or oral sensitivity. Her appetite is fair  and she denies any problems with nausea or emesis. She's had no change in urinary habits, and denies dysuria or hematuria. She has occasional headache she attributes to stress. Her vision is a little blurry. She denies any dizziness. She does have some continued anxiety.   She currently denies any unusual myalgias, arthralgias, or bony pain.  A  detailed review of systems is otherwise noncontributory.    PAST MEDICAL HISTORY: Past Medical History  Diagnosis Date  . Chest pain 2009    Consultation-[facility], negative chest CT; nl echo in 2005; h/o palpitations  . Palpitations   . Degenerative joint disease     + degenerative joint disease of the lumbosacral spine  . Colitis 2010    not IBD   . Herpes simplex type II infection   . Allergic rhinitis   . Hypercholesteremia     "slightly high"  . Anxiety   . Heart murmur     "small"  . GERD (gastroesophageal reflux disease)     "a little"  . Breast cancer 03/31/13    left  . Cancer   . Depression   . Hypertension   . Anemia, unspecified 05/30/2013    PAST SURGICAL HISTORY: Past Surgical History  Procedure Laterality Date  . Colonoscopy  10/2010    proctitis; melanosis coli  . Tubal ligation    . Breast excisional biopsy  04/2003    Left; benign disease  . Right oophorectomy  03/13/2007  . Abdominal hysterectomy  03/13/2007    TAH ?BSO--Dr Cheron Every, [place]  . Eye surgery Left     "fix lazy eye"  . Portacath placement Right 04/21/2013    Procedure: INSERTION PORT-A-CATH;  Surgeon: Kandis Cocking, MD;  Location: [facility];  Service: General;  Laterality: Right;    FAMILY HISTORY Family History  Problem Relation Age of Onset  . Aneurysm Mother     Cerebral  . Hypertension Mother   . Hyperlipidemia Mother   . Stroke Mother   . Coronary artery disease Father   . Hypertension Father   . Hyperlipidemia Father   . Lung cancer Brother   . Cancer Brother     Lung  The patient's father died at the age of 51 from a myocardial infarction. The patient's mother died at the age of 62 from a stroke. The patient had 6 brothers, 2 sisters. One brother has a history of lung cancer in the setting of tobacco abuse. Another brother had multiple myeloma. There is no history of breast or ovarian cancer in the family  GYNECOLOGIC HISTORY:  Menarche age 51, first live birth age 40, the patient is GX P4. She status post abdominal hysterectomy with bilateral salpingo-oophorectomy. She never took hormone replacement.   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 Her husband [name] works in Designer, fashion/clothing. Daughter Candida Peeling lives at home with the patient. She is not employed. Son Margaretha Mahan lives in [place] and is not employed. Daughter [name] lives in [place] and is a Community education officer. Daughter [name] is a Child psychotherapist in [place]. The patient has 10 grandchildren. She attends [employer]     ADVANCED DIRECTIVES: Not in place   HEALTH MAINTENANCE:  (Updated 05/30/2013)  History  Substance Use Topics  . Smoking status: Former Smoker -- 0.50 packs/day for 15 years    Types: Cigarettes    Quit date: 07/06/1982  . Smokeless tobacco: Never Used  . Alcohol Use: No     Colonoscopy:2012  PAP:2014   Bone density: Never  Lipid panel: Not on file    Allergies  Allergen Reactions  . Doxycycline Other (See Comments)    Unknown    Current Outpatient Prescriptions  Medication Sig Dispense Refill  . ALPRAZolam (XANAX) 1 MG tablet 1  po TID prn anxiety  90 tablet  0  . atenolol (TENORMIN) 50 MG tablet TAKE 1 TABLET BY MOUTH EVERY DAY FOR PALPITATION  90 tablet  1  . cholestyramine (QUESTRAN) 4 G packet Take 1 packet (4 g total) by mouth 2 (two) times daily as needed (diarrhea).  30 each  1  . citalopram (CELEXA) 40 MG tablet Take 40 mg by mouth every evening.      . clindamycin (CLINDAGEL) 1 % gel Apply topically 2 (two) times daily.  30 g  1  . dexamethasone (DECADRON) 4 MG tablet Take 2 tablets (8 mg total) by mouth 2 (two) times daily with a meal. Take two times a day the day before Taxotere. Then take two times a day starting the day after chemo for 3 days.  30 tablet  1  . diphenhydramine-acetaminophen (TYLENOL PM) 25-500 MG TABS Take 2 tablets by mouth at bedtime.      . lidocaine-prilocaine (EMLA) cream Apply topically as needed.  30 g  0  . LORazepam (ATIVAN) 0.5 MG tablet Take 1 tablet (0.5 mg total) by mouth every 6 (six) hours as needed (Nausea or vomiting).  30 tablet  0  . ondansetron (ZOFRAN) 8 MG tablet Take 1 tablet (8 mg total) by mouth 2 (two) times daily. Take two times a day starting the day after  chemo for 3 days. Then take two times a day as needed for nausea or vomiting.  30 tablet  1  . prochlorperazine (COMPAZINE) 10 MG tablet Take 1 tablet (10 mg total) by mouth every 6 (six) hours as needed (Nausea or vomiting).  30 tablet  1  . valACYclovir (VALTREX) 1000 MG tablet Take 1,000 mg by mouth daily.       No current facility-administered medications for this visit.    OBJECTIVE: Middle-aged [place] American [name] who appears tired but is in no acute distress Filed Vitals:   05/30/13 0904  BP: 108/70  Pulse: 74  Temp: 98.6 F (37 C)  Resp: 18     Body mass index is 30.8 kg/(m^2).    ECOG FS:1 Filed Weights   05/30/13 0904  Weight: 162 lb 14.4 oz (73.891 kg)   Physical Exam: HEENT:  Sclerae anicteric.  Oropharynx clear. No ulcerations or evidence of candidiasis. NODES:  No cervical or supraclavicular lymphadenopathy palpated.  BREAST EXAM:  Deferred.  Axillae are benign bilaterally with no palpable lymphadenopathy. LUNGS:  Clear to auscultation bilaterally.  No wheezes or rhonchi. Good excursion bilaterally.  HEART:  Regular rate and rhythm. Soft systolic murmur  ABDOMEN:  Soft, nontender to palpation .  Positive bowel sounds.  MSK:  No focal spinal tenderness to palpation. Full range of motion in the upper extremities. EXTREMITIES:  No peripheral edema.   SKIN:    previously noted  acneform rash on the face has resolved. No nailbed changesare noted. Port is intact with no erythema, edema, or evidence of  cellulitis/infection. NEURO:  Nonfocal. Well oriented.  [name] [name].    LAB RESULTS:   Lab Results  Component Value Date   WBC 8.3 05/30/2013   NEUTROABS 5.3 05/30/2013   HGB 11.4* 05/30/2013   HCT 34.3* 05/30/2013   MCV 83.5 05/30/2013   PLT 196 05/30/2013      Chemistry      Component Value Date/Time   NA 138 05/09/2013 1402   NA 139 06/06/2008 0545   K 3.8 05/09/2013 1402   K 3.8 06/06/2008 0545   CL 104 06/06/2008  0545   CO2 25 05/09/2013 1402   CO2  26 06/06/2008 0545   BUN 20.1 05/09/2013 1402   BUN 6 06/06/2008 0545   CREATININE 0.9 05/09/2013 1402   CREATININE 0.77 06/06/2008 0545      Component Value Date/Time   CALCIUM 10.0 05/09/2013 1402   CALCIUM 8.7 06/06/2008 0545   ALKPHOS 86 05/09/2013 1402   ALKPHOS 62 04/12/2008 1725   AST 25 05/09/2013 1402   AST 21 04/12/2008 1725   ALT 69* 05/09/2013 1402   ALT 28 04/12/2008 1725   BILITOT 0.30 05/09/2013 1402   BILITOT 0.5 04/12/2008 1725       STUDIES:  Echocardiogram on 04/20/2013 showed an ejection fraction of 60-65%.     ASSESSMENT: 53 y.o. [name] [name], [name] [name]   (1)  status post left breast biopsy 03/29/2013 for a clinical T2-T3 pN1, stage IIB-IIIA invasive ductal carcinoma, grade 2-3, estrogen receptor 39% positive (with moderate staining intensity), progesterone receptor negative, with an MIB-1 of 83%, and HER-2 amplified by CISH, with a ratio of 7.5 and 15 HER-2 copies per cell  (2) receiving neoadjuvant chemotherapy, with trastuzumab/ pertuzumab/ carboplatin/docetaxel started 05/01/2013, to be repeated every 3 weeks x6. The patient will receive Neulasta on day 2 at [facility] per her request.     PLAN: I really don't have an easy answer regarding [name] [name]'s daily fluctuation between constipation and loose stools. I have encouraged her to keep herself well hydrated, and to stay away from foods she knows are likely to constipate her.  I also recommended that she try a product such as A&D ointment for the irritation.    Otherwise, she will return to see me on December 8 in anticipation of day 1 cycle 3 of treatment. She will have a repeat breast MRI soon thereafter, approximately December 11, to assess response to treatment. She'll need to see her surgeon, Dr. Ezzard Standing, following the breast MRI for routine physical exam in mid December.  She's also due for her next echocardiogram and will be seeing Dr. Gala Romney in late January.  All of this was reviewed with the patient  today. She voices understanding and agreement with our plan, and will call with any changes or problems prior to her appointment here on December 8.  I have asked her specifically to contact us if her bowel issues worsen.  [name] Shenoy, PA-C   05/30/2013 9:30 AM

## 2013-06-02 ENCOUNTER — Telehealth: Payer: Self-pay | Admitting: *Deleted

## 2013-06-02 ENCOUNTER — Other Ambulatory Visit: Payer: Self-pay | Admitting: Certified Registered Nurse Anesthetist

## 2013-06-02 NOTE — Telephone Encounter (Signed)
Per staff message and POF I have scheduled appts.  JMW  

## 2013-06-03 ENCOUNTER — Encounter: Payer: Self-pay | Admitting: Oncology

## 2013-06-08 ENCOUNTER — Telehealth: Payer: Self-pay | Admitting: Oncology

## 2013-06-08 NOTE — Telephone Encounter (Signed)
per 11/25 pof pt to have breast mri 12/11 - see Dr. Ezzard Standing mid dec - echo/bensimhon late jan 2015. pt already on schedule w/Dr. Dani Gobble 07/12/13 - per AB this is ok. s/w heather @ [facility] who has placed pt on recall for ech/bensimhon late jan and lmonvm for Pam (mri screener) @ [facility] re contacting pt w/breast mri appt - order in EPIC. call pt w/this inform but was not able to reach her at primary # or lm. lmonvm for pt on cell. no other orders.

## 2013-06-08 NOTE — Telephone Encounter (Signed)
both Lynden Ang and Pam from [employer] asking that I have pt call [facility] to schedule brest mri. per [facility] procedure now is that we call w/pt name/DOB and pt is to call in for appt. s/w pt she is aware and was given phone #. pt also made aware that 1st available appt w/be around 12/14 per Pam.

## 2013-06-12 ENCOUNTER — Encounter: Payer: Self-pay | Admitting: Physician Assistant

## 2013-06-12 ENCOUNTER — Ambulatory Visit
Admission: RE | Admit: 2013-06-12 | Discharge: 2013-06-12 | Disposition: A | Discharge status: Home / Self Care | Payer: BC Managed Care – PPO | Source: Ambulatory Visit | Attending: Oncology | Admitting: Oncology

## 2013-06-12 ENCOUNTER — Other Ambulatory Visit: Payer: BC Managed Care – PPO | Admitting: Lab

## 2013-06-12 ENCOUNTER — Ambulatory Visit (HOSPITAL_BASED_OUTPATIENT_CLINIC_OR_DEPARTMENT_OTHER): Payer: BC Managed Care – PPO | Admitting: Physician Assistant

## 2013-06-12 ENCOUNTER — Other Ambulatory Visit (HOSPITAL_BASED_OUTPATIENT_CLINIC_OR_DEPARTMENT_OTHER): Payer: BC Managed Care – PPO | Admitting: Lab

## 2013-06-12 ENCOUNTER — Ambulatory Visit (HOSPITAL_BASED_OUTPATIENT_CLINIC_OR_DEPARTMENT_OTHER): Payer: BC Managed Care – PPO

## 2013-06-12 VITALS — BP 114/72 | HR 81 | Temp 97.7°F | Resp 18 | Ht 61.0 in | Wt 166.3 lb

## 2013-06-12 DIAGNOSIS — Z5112 Encounter for antineoplastic immunotherapy: Secondary | ICD-10-CM

## 2013-06-12 DIAGNOSIS — Z5111 Encounter for antineoplastic chemotherapy: Secondary | ICD-10-CM

## 2013-06-12 DIAGNOSIS — C50412 Malignant neoplasm of upper-outer quadrant of left female breast: Secondary | ICD-10-CM

## 2013-06-12 DIAGNOSIS — Z170 Estrogen receptor positive status [ER+]: Secondary | ICD-10-CM

## 2013-06-12 DIAGNOSIS — C50419 Malignant neoplasm of upper-outer quadrant of unspecified female breast: Secondary | ICD-10-CM

## 2013-06-12 LAB — CBC WITH DIFFERENTIAL/PLATELET
BASO%: 0.3 % (ref 0.0–2.0)
Basophils Absolute: 0 10*3/uL (ref 0.0–0.1)
EOS%: 0.1 % (ref 0.0–7.0)
Eosinophils Absolute: 0 10*3/uL (ref 0.0–0.5)
HCT: 31 % — ABNORMAL LOW (ref 34.8–46.6)
HGB: 10.3 g/dL — ABNORMAL LOW (ref 11.6–15.9)
LYMPH%: 30.2 % (ref 14.0–49.7)
MCH: 27.8 pg (ref 25.1–34.0)
MCHC: 33.2 g/dL (ref 31.5–36.0)
MCV: 83.6 fL (ref 79.5–101.0)
MONO#: 0.6 10*3/uL (ref 0.1–0.9)
MONO%: 9 % (ref 0.0–14.0)
NEUT#: 4 10*3/uL (ref 1.5–6.5)
NEUT%: 60.4 % (ref 38.4–76.8)
Platelets: 201 10*3/uL (ref 145–400)
RBC: 3.71 10*6/uL (ref 3.70–5.45)
RDW: 15.8 % — ABNORMAL HIGH (ref 11.2–14.5)
WBC: 6.7 10*3/uL (ref 3.9–10.3)
lymph#: 2 10*3/uL (ref 0.9–3.3)
nRBC: 0 % (ref 0–0)

## 2013-06-12 MED ORDER — HEPARIN SOD (PORK) LOCK FLUSH 100 UNIT/ML IV SOLN
500.0000 [IU] | Freq: Once | INTRAVENOUS | Status: AC | PRN
  Administered 2013-06-12: 500 [IU]
  Filled 2013-06-12: qty 5

## 2013-06-12 MED ORDER — CARBOPLATIN CHEMO INTRADERMAL TEST DOSE 100MCG/0.02ML: 100.0000 ug | Freq: Once | INTRADERMAL | Status: DC

## 2013-06-12 MED ORDER — SODIUM CHLORIDE 0.9 % IV SOLN
420.0000 mg | Freq: Once | INTRAVENOUS | Status: AC
  Administered 2013-06-12: 420 mg via INTRAVENOUS
  Filled 2013-06-12: qty 14

## 2013-06-12 MED ORDER — ACETAMINOPHEN 325 MG PO TABS
ORAL_TABLET | ORAL | Status: AC
  Filled 2013-06-12: qty 2

## 2013-06-12 MED ORDER — DIPHENHYDRAMINE HCL 25 MG PO CAPS
25.0000 mg | ORAL_CAPSULE | Freq: Once | ORAL | Status: AC
  Administered 2013-06-12: 25 mg via ORAL

## 2013-06-12 MED ORDER — ONDANSETRON 16 MG/50ML IVPB (CHCC)
16.0000 mg | Freq: Once | INTRAVENOUS | Status: AC
  Administered 2013-06-12: 16 mg via INTRAVENOUS

## 2013-06-12 MED ORDER — ACETAMINOPHEN 325 MG PO TABS
650.0000 mg | ORAL_TABLET | Freq: Once | ORAL | Status: AC
  Administered 2013-06-12: 650 mg via ORAL

## 2013-06-12 MED ORDER — TRASTUZUMAB CHEMO INJECTION 440 MG
6.0000 mg/kg | Freq: Once | INTRAVENOUS | Status: AC
  Administered 2013-06-12: 462 mg via INTRAVENOUS
  Filled 2013-06-12: qty 22

## 2013-06-12 MED ORDER — SODIUM CHLORIDE 0.9 % IV SOLN
732.6000 mg | Freq: Once | INTRAVENOUS | Status: AC
  Administered 2013-06-12: 730 mg via INTRAVENOUS
  Filled 2013-06-12: qty 73

## 2013-06-12 MED ORDER — SODIUM CHLORIDE 0.9 % IV SOLN
Freq: Once | INTRAVENOUS | Status: AC
  Administered 2013-06-12: 20 mL/h via INTRAVENOUS

## 2013-06-12 MED ORDER — GADOBENATE DIMEGLUMINE 529 MG/ML IV SOLN
15.0000 mL | Freq: Once | INTRAVENOUS | Status: AC | PRN
  Administered 2013-06-12: 15 mL via INTRAVENOUS

## 2013-06-12 MED ORDER — DEXAMETHASONE SODIUM PHOSPHATE 20 MG/5ML IJ SOLN
INTRAMUSCULAR | Status: AC
  Filled 2013-06-12: qty 5

## 2013-06-12 MED ORDER — ONDANSETRON 16 MG/50ML IVPB (CHCC)
INTRAVENOUS | Status: AC
  Filled 2013-06-12: qty 16

## 2013-06-12 MED ORDER — DEXAMETHASONE SODIUM PHOSPHATE 20 MG/5ML IJ SOLN
20.0000 mg | Freq: Once | INTRAMUSCULAR | Status: AC
  Administered 2013-06-12: 20 mg via INTRAVENOUS

## 2013-06-12 MED ORDER — SODIUM CHLORIDE 0.9 % IV SOLN
75.0000 mg/m2 | Freq: Once | INTRAVENOUS | Status: AC
  Administered 2013-06-12: 130 mg via INTRAVENOUS
  Filled 2013-06-12: qty 13

## 2013-06-12 MED ORDER — DIPHENHYDRAMINE HCL 25 MG PO CAPS
ORAL_CAPSULE | ORAL | Status: AC
  Filled 2013-06-12: qty 1

## 2013-06-12 MED ORDER — SODIUM CHLORIDE 0.9 % IJ SOLN
10.0000 mL | INTRAMUSCULAR | Status: DC | PRN
  Administered 2013-06-12: 10 mL
  Filled 2013-06-12: qty 10

## 2013-06-12 NOTE — Progress Notes (Signed)
ID: [name] [name] OB: [birth year]  MR#: [id number]  WUJ#:[id number]  PCP: Harlow Asa, MD ; Sherie Don, NP GYN:   Conley Simmonds, MD  SU: Ovidio Kin, MD RADONC:  Lurline Hare, MD OTHER MD: Regino Schultze, MD;  Estelle Grumbles, MD  CHIEF COMPLAINT:  Left Breast Cancer/Neoadjuvant Chemo  HISTORY OF PRESENT ILLNESS: [name] [name] palpated a mass in her left breast in late August 2014. She was already scheduled for screening mammography at [name] and this was performed 03/13/2013. Indeed a possible mass was noted in the left breast and on 03/29/2013 the patient underwent diagnostic left mammography and left ultrasonography. This showed a 2.5 cm irregular mass in the upper outer quadrant of the left breast. This was palpable.by ultrasound there was a 1.6 cm irregular hypoechoic mass in the area in question, and in addition to enlarged left axillary lymph nodes were noted, the largest measuring 2.5 cm.  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  Bilateral breast MRI 04/11/2013 shows 3 abnormal enhancing masses in the upper outer quadrant of the left breast measuring altogether 6.4 cm. The largest separate mass measured 2.7 cm. There were abnormal or large lymph nodes in the left axilla. Ultrasound guided biopsy of the 2 smaller masses has been scheduled.  The patient's subsequent history is as detailed below   INTERVAL HISTORY: [name] [name] returns today accompanied by her husband for followup of her locally advanced left breast cancer. She is due for day 1 cycle 3 of 6 planned q. three-week cycles of  docetaxel/carboplatin given with trastuzumab/pertuzumab  given in the neoadjuvant setting. She receives Neulasta on day 2 for granulocyte support, and these are given through our [facility] office per her request.  [name] [name] a breast MRI earlier today, but unfortunately I do not yet have those results. She herself feels that the breast mass is shrinking. She has recovered well from cycle 2. Her bowels have returned to normal, currently with neither diarrhea nor constipation. She's keeping herself well hydrated.   [name] [name]  had increased pain in her shoulders and hips following the Neulasta injection, and this is gradually improving. Fortunately, she's had no problems with numbness or tingling in the extremities.    REVIEW OF SYSTEMS: [name] [name] has had no fevers, chills or night sweats. She denies any skin changes or rashes, and has had no abnormal bruising or bleeding. She's had no mouth ulcers or oral sensitivity. Her appetite is fair  and she denies any problems with nausea or emesis. She's had no change in urinary habits. She's had no cough, increased shortness of breath, orthopnea, chest pain, or palpitations.  She has had no abnormal headaches and denies any dizziness. Her vision is sometimes blurred.  She does have some continued anxiety which she feels is reasonably controlled with Xanax as prescribed by her primary care office.   She currently denies any unusual myalgias, arthralgias, or bony pain, and has had no peripheral swelling.  A detailed review of systems is otherwise noncontributory.    PAST MEDICAL HISTORY: Past Medical History  Diagnosis Date  . Chest pain 2009    Consultation-[facility], negative chest CT; nl echo in 2005;  h/o palpitations  . Palpitations   . Degenerative joint disease     + degenerative joint disease of the lumbosacral spine  . Colitis 2010    not IBD  . Herpes simplex type II infection   . Allergic rhinitis   . Hypercholesteremia     "slightly high"  . Anxiety   . Heart murmur     "small"  . GERD  (gastroesophageal reflux disease)     "a little"  . Breast cancer 03/31/13    left  . Cancer   . Depression   . Hypertension   . Anemia, unspecified 05/30/2013    PAST SURGICAL HISTORY: Past Surgical History  Procedure Laterality Date  . Colonoscopy  10/2010    proctitis; melanosis coli  . Tubal ligation    . Breast excisional biopsy  04/2003    Left; benign disease  . Right oophorectomy  03/13/2007  . Abdominal hysterectomy  03/13/2007    TAH ?BSO--Dr Cheron Every, [place]  . Eye surgery Left     "fix lazy eye"  . Portacath placement Right 04/21/2013    Procedure: INSERTION PORT-A-CATH;  Surgeon: Kandis Cocking, MD;  Location: [facility];  Service: General;  Laterality: Right;    FAMILY HISTORY Family History  Problem Relation Age of Onset  . Aneurysm Mother     Cerebral  . Hypertension Mother   . Hyperlipidemia Mother   . Stroke Mother   . Coronary artery disease Father   . Hypertension Father   . Hyperlipidemia Father   . Lung cancer Brother   . Cancer Brother     Lung  The patient's father died at the age of 65 from a myocardial infarction. The patient's mother died at the age of 49 from a stroke. The patient had 6 brothers, 2 sisters. One brother has a history of lung cancer in the setting of tobacco abuse. Another brother had multiple myeloma. There is no history of breast or ovarian cancer in the family  GYNECOLOGIC HISTORY:  Menarche age 10, first live birth age 39, the patient is GX P4. She status post abdominal hysterectomy with bilateral salpingo-oophorectomy. She never took hormone replacement.   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 Her husband [name] works in Designer, fashion/clothing. Daughter Candida Peeling lives at home with the patient. She is not employed. Son [name] lives in [place] and is not employed. Daughter [name] lives in [place] and is a Community education officer. Daughter [name] is a Child psychotherapist  in [place]. The patient has 10 grandchildren. She attends the [employer]     ADVANCED DIRECTIVES: Not in place   HEALTH MAINTENANCE:  (Updated 05/30/2013)  History  Substance Use Topics  . Smoking status: Former Smoker -- 0.50 packs/day for 15 years    Types: Cigarettes    Quit date: 07/06/1982  . Smokeless tobacco: Never Used  . Alcohol Use: No     Colonoscopy:2012  PAP:2014   Bone density: Never  Lipid panel: Not on file    Allergies  Allergen Reactions  . Doxycycline Other (See Comments)    Unknown    Current Outpatient Prescriptions  Medication Sig Dispense Refill  . ALPRAZolam (XANAX) 1 MG tablet 1 po TID prn anxiety  90 tablet  0  . atenolol (TENORMIN) 50 MG tablet TAKE 1 TABLET BY MOUTH EVERY DAY FOR PALPITATION  90 tablet  1  . cholestyramine (QUESTRAN) 4  G packet Take 1 packet (4 g total) by mouth 2 (two) times daily as needed (diarrhea).  30 each  1  . citalopram (CELEXA) 40 MG tablet Take 40 mg by mouth every evening.      . clindamycin (CLINDAGEL) 1 % gel Apply topically 2 (two) times daily.  30 g  1  . dexamethasone (DECADRON) 4 MG tablet Take 2 tablets (8 mg total) by mouth 2 (two) times daily with a meal. Take two times a day the day before Taxotere. Then take two times a day starting the day after chemo for 3 days.  30 tablet  1  . diphenhydramine-acetaminophen (TYLENOL PM) 25-500 MG TABS Take 2 tablets by mouth at bedtime.      . lidocaine-prilocaine (EMLA) cream Apply topically as needed.  30 g  0  . LORazepam (ATIVAN) 0.5 MG tablet Take 1 tablet (0.5 mg total) by mouth every 6 (six) hours as needed (Nausea or vomiting).  30 tablet  0  . ondansetron (ZOFRAN) 8 MG tablet Take 1 tablet (8 mg total) by mouth 2 (two) times daily. Take two times a day starting the day after chemo for 3 days. Then take two times a day as needed for nausea or vomiting.  30 tablet  1  . prochlorperazine (COMPAZINE) 10 MG tablet Take 1 tablet (10 mg total)  by mouth every 6 (six) hours as needed (Nausea or vomiting).  30 tablet  1  . valACYclovir (VALTREX) 1000 MG tablet Take 1,000 mg by mouth daily.       No current facility-administered medications for this visit.   Facility-Administered Medications Ordered in Other Visits  Medication Dose Route Frequency Provider Last Rate Last Dose  . sodium chloride 0.9 % injection 10 mL  10 mL Intracatheter PRN Lowella Dell, MD   10 mL at 06/12/13 1529    OBJECTIVE: Middle-aged [place] American woman who is in no acute distress Filed Vitals:   06/12/13 1016  BP: 114/72  Pulse: 81  Temp: 97.7 F (36.5 C)  Resp: 18     Body mass index is 31.44 kg/(m^2).    ECOG FS:1 Filed Weights   06/12/13 1016  Weight: 166 lb 4.8 oz (75.433 kg)   Physical Exam: HEENT:  Sclerae anicteric.  Oropharynx clear and moist. No ulcerations or evidence of candidiasis. NODES:  No cervical or supraclavicular lymphadenopathy palpated.  BREAST EXAM:     Right breast is unremarkable. In the upper outer quadrant of the left breast there is a palpable thickening measuring less than 2 cm, less mass like than when previously examined. Axillae are benign bilaterally, and I was unable to palpate any lymphadenopathy today.  LUNGS:  Clear to auscultation bilaterally.  No wheezes or rhonchi.   HEART:  Regular rate and rhythm. Soft systolic murmur  ABDOMEN:  Soft, nontender to palpation .  Positive bowel sounds.  MSK:  No focal spinal tenderness to palpation. Good range of motion in the upper extremities. EXTREMITIES:  No peripheral edema.   SKIN:   No skin changes noted. No nail dyscrasia noted.  Port is intact with no erythema, edema, or evidence of  cellulitis/infection. NEURO:  Nonfocal. Well oriented.  Fatigued affect.    LAB RESULTS:   Lab Results  Component Value Date   WBC 6.7 06/12/2013   NEUTROABS 4.0 06/12/2013   HGB 10.3* 06/12/2013   HCT 31.0* 06/12/2013   MCV 83.6 06/12/2013   PLT 201 06/12/2013       Chemistry  Component Value Date/Time   NA 140 05/30/2013 0831   NA 139 06/06/2008 0545   K 3.5 05/30/2013 0831   K 3.8 06/06/2008 0545   CL 104 06/06/2008 0545   CO2 25 05/30/2013 0831   CO2 26 06/06/2008 0545   BUN 12.2 05/30/2013 0831   BUN 6 06/06/2008 0545   CREATININE 0.9 05/30/2013 0831   CREATININE 0.77 06/06/2008 0545      Component Value Date/Time   CALCIUM 9.4 05/30/2013 0831   CALCIUM 8.7 06/06/2008 0545   ALKPHOS 89 05/30/2013 0831   ALKPHOS 62 04/12/2008 1725   AST 23 05/30/2013 0831   AST 21 04/12/2008 1725   ALT 63* 05/30/2013 0831   ALT 28 04/12/2008 1725   BILITOT 0.47 05/30/2013 0831   BILITOT 0.5 04/12/2008 1725       STUDIES:  Echocardiogram on 04/20/2013 showed an ejection fraction of 60-65%.     ASSESSMENT: 52 y.o. [place], [place] woman   (1)  status post left breast biopsy 03/29/2013 for a clinical T2-T3 pN1, stage IIB-IIIA invasive ductal carcinoma, grade 2-3, estrogen receptor 39% positive (with moderate staining intensity), progesterone receptor negative, with an MIB-1 of 83%, and HER-2 amplified by CISH, with a ratio of 7.5 and 15 HER-2 copies per cell  (2) receiving neoadjuvant chemotherapy, with trastuzumab/ pertuzumab/ carboplatin/docetaxel started 05/01/2013, to be repeated every 3 weeks x6. The patient will receive Neulasta on day 2 at [facility] per her request.     PLAN: .[name] [name] will proceed to treatment today as scheduled for her third cycle of neoadjuvant chemotherapy as scheduled. She'll receive her Neulasta injection tomorrow through our  [facility] office. I will see her again next week on December 15 for assessment of chemotoxicity, and we will be able to review her breast MRI results at that time. She's already scheduled to meet with her surgeon in early January as well.  She's also due for her next echocardiogram and will be seeing Dr. Gala Romney in late January.  All of this was reviewed with the patient today. She voices  understanding and agreement with our plan, and will call with any changes or problems prior to her appointment here next week.   [name] Scale, PA-C   06/12/2013 5:53 PM

## 2013-06-12 NOTE — Patient Instructions (Signed)
[facility] Discharge Instructions for Patients Receiving Chemotherapy  Today you received the following chemotherapy agents Taxotere/Carboplatin/Herceptin/Perjeta.  To help prevent nausea and vomiting after your treatment, we encourage you to take your nausea medication as directed.   If you develop nausea and vomiting that is not controlled by your nausea medication, call the clinic.   BELOW ARE SYMPTOMS THAT SHOULD BE REPORTED IMMEDIATELY:  *FEVER GREATER THAN 100.5 F  *CHILLS WITH OR WITHOUT FEVER  NAUSEA AND VOMITING THAT IS NOT CONTROLLED WITH YOUR NAUSEA MEDICATION  *UNUSUAL SHORTNESS OF BREATH  *UNUSUAL BRUISING OR BLEEDING  TENDERNESS IN MOUTH AND THROAT WITH OR WITHOUT PRESENCE OF ULCERS  *URINARY PROBLEMS  *BOWEL PROBLEMS  UNUSUAL RASH Items with * indicate a potential emergency and should be followed up as soon as possible.  Feel free to call the clinic you have any questions or concerns. The clinic phone number is [phone].

## 2013-06-13 ENCOUNTER — Encounter (HOSPITAL_COMMUNITY): Payer: BC Managed Care – PPO | Attending: Oncology

## 2013-06-13 ENCOUNTER — Encounter: Payer: Self-pay | Admitting: Oncology

## 2013-06-13 VITALS — BP 119/69 | HR 85 | Resp 16

## 2013-06-13 DIAGNOSIS — C50419 Malignant neoplasm of upper-outer quadrant of unspecified female breast: Secondary | ICD-10-CM | POA: Insufficient documentation

## 2013-06-13 DIAGNOSIS — C50412 Malignant neoplasm of upper-outer quadrant of left female breast: Secondary | ICD-10-CM

## 2013-06-13 DIAGNOSIS — Z5189 Encounter for other specified aftercare: Secondary | ICD-10-CM

## 2013-06-13 MED ORDER — PEGFILGRASTIM INJECTION 6 MG/0.6ML
SUBCUTANEOUS | Status: AC
  Filled 2013-06-13: qty 0.6

## 2013-06-13 MED ORDER — PEGFILGRASTIM INJECTION 6 MG/0.6ML
6.0000 mg | Freq: Once | SUBCUTANEOUS | Status: AC
  Administered 2013-06-13: 6 mg via SUBCUTANEOUS

## 2013-06-19 ENCOUNTER — Encounter: Payer: Self-pay | Admitting: Physician Assistant

## 2013-06-19 ENCOUNTER — Ambulatory Visit (HOSPITAL_BASED_OUTPATIENT_CLINIC_OR_DEPARTMENT_OTHER): Payer: BC Managed Care – PPO | Admitting: Physician Assistant

## 2013-06-19 ENCOUNTER — Other Ambulatory Visit (HOSPITAL_BASED_OUTPATIENT_CLINIC_OR_DEPARTMENT_OTHER): Payer: BC Managed Care – PPO

## 2013-06-19 ENCOUNTER — Telehealth: Payer: Self-pay | Admitting: *Deleted

## 2013-06-19 VITALS — BP 119/78 | HR 81 | Temp 98.9°F | Resp 18 | Wt 159.9 lb

## 2013-06-19 DIAGNOSIS — K219 Gastro-esophageal reflux disease without esophagitis: Secondary | ICD-10-CM

## 2013-06-19 DIAGNOSIS — R12 Heartburn: Secondary | ICD-10-CM

## 2013-06-19 DIAGNOSIS — Z170 Estrogen receptor positive status [ER+]: Secondary | ICD-10-CM

## 2013-06-19 DIAGNOSIS — G44219 Episodic tension-type headache, not intractable: Secondary | ICD-10-CM | POA: Insufficient documentation

## 2013-06-19 DIAGNOSIS — R519 Headache, unspecified: Secondary | ICD-10-CM

## 2013-06-19 DIAGNOSIS — C50412 Malignant neoplasm of upper-outer quadrant of left female breast: Secondary | ICD-10-CM

## 2013-06-19 DIAGNOSIS — F32A Depression, unspecified: Secondary | ICD-10-CM

## 2013-06-19 DIAGNOSIS — R110 Nausea: Secondary | ICD-10-CM

## 2013-06-19 DIAGNOSIS — F411 Generalized anxiety disorder: Secondary | ICD-10-CM

## 2013-06-19 DIAGNOSIS — K5900 Constipation, unspecified: Secondary | ICD-10-CM

## 2013-06-19 DIAGNOSIS — C50419 Malignant neoplasm of upper-outer quadrant of unspecified female breast: Secondary | ICD-10-CM

## 2013-06-19 DIAGNOSIS — F329 Major depressive disorder, single episode, unspecified: Secondary | ICD-10-CM

## 2013-06-19 LAB — COMPREHENSIVE METABOLIC PANEL (CC13)
ALT: 36 U/L (ref 0–55)
AST: 28 U/L (ref 5–34)
Albumin: 4.4 g/dL (ref 3.5–5.0)
Alkaline Phosphatase: 113 U/L (ref 40–150)
Anion Gap: 12 mEq/L — ABNORMAL HIGH (ref 3–11)
BUN: 19.8 mg/dL (ref 7.0–26.0)
CO2: 27 mEq/L (ref 22–29)
Calcium: 10.1 mg/dL (ref 8.4–10.4)
Chloride: 100 mEq/L (ref 98–109)
Creatinine: 1.5 mg/dL — ABNORMAL HIGH (ref 0.6–1.1)
Glucose: 106 mg/dl (ref 70–140)
Potassium: 3.4 mEq/L — ABNORMAL LOW (ref 3.5–5.1)
Sodium: 139 mEq/L (ref 136–145)
Total Bilirubin: 0.48 mg/dL (ref 0.20–1.20)
Total Protein: 7.9 g/dL (ref 6.4–8.3)

## 2013-06-19 LAB — CBC WITH DIFFERENTIAL/PLATELET
BASO%: 0.3 % (ref 0.0–2.0)
Basophils Absolute: 0 10*3/uL (ref 0.0–0.1)
EOS%: 0.1 % (ref 0.0–7.0)
Eosinophils Absolute: 0 10*3/uL (ref 0.0–0.5)
HCT: 35.6 % (ref 34.8–46.6)
HGB: 11.7 g/dL (ref 11.6–15.9)
LYMPH%: 16.5 % (ref 14.0–49.7)
MCH: 28 pg (ref 25.1–34.0)
MCHC: 32.9 g/dL (ref 31.5–36.0)
MCV: 85.3 fL (ref 79.5–101.0)
MONO#: 3.2 10*3/uL — ABNORMAL HIGH (ref 0.1–0.9)
MONO%: 17.2 % — ABNORMAL HIGH (ref 0.0–14.0)
NEUT#: 12.2 10*3/uL — ABNORMAL HIGH (ref 1.5–6.5)
NEUT%: 65.9 % (ref 38.4–76.8)
Platelets: 338 10*3/uL (ref 145–400)
RBC: 4.17 10*6/uL (ref 3.70–5.45)
RDW: 15.8 % — ABNORMAL HIGH (ref 11.2–14.5)
WBC: 18.5 10*3/uL — ABNORMAL HIGH (ref 3.9–10.3)
lymph#: 3.1 10*3/uL (ref 0.9–3.3)

## 2013-06-19 NOTE — Telephone Encounter (Signed)
appts made and printed. Pt is aware that tx will be added. i emailed MW to add the tx. Pt is also aware that i lm for [name] [name] @ [facility] to get her scheduled for her injection...td

## 2013-06-19 NOTE — Telephone Encounter (Signed)
Per staff message and POF I have scheduled appts.  JMW  

## 2013-06-19 NOTE — Progress Notes (Signed)
ID: [name] [name] OB: May 22, 1961  MR#: [id number]  WUJ#:[id number]  PCP: Harlow Asa, MD ; Sherie Don, NP GYN:   Conley Simmonds, MD  SU: Ovidio Kin, MD RADONC:  Lurline Hare, MD OTHER MD: Regino Schultze, MD;  Estelle Grumbles, MD  CHIEF COMPLAINT:  Left Breast Cancer/Neoadjuvant Chemo  HISTORY OF PRESENT ILLNESS: [name] [name] palpated a mass in her left breast in late August 2014. She was already scheduled for screening mammography at [facility] and this was performed 03/13/2013. Indeed a possible mass was noted in the left breast and on 03/29/2013 the patient underwent diagnostic left mammography and left ultrasonography. This showed a 2.5 cm irregular mass in the upper outer quadrant of the left breast. This was palpable.by ultrasound there was a 1.6 cm irregular hypoechoic mass in the area in question, and in addition to enlarged left axillary lymph nodes were noted, the largest measuring 2.5 cm.  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  Bilateral breast MRI 04/11/2013 shows 3 abnormal enhancing masses in the upper outer quadrant of the left breast measuring altogether 6.4 cm. The largest separate mass measured 2.7 cm. There were abnormal or large lymph nodes in the left axilla. Ultrasound guided biopsy of the 2 smaller masses has been scheduled.  The patient's subsequent history is as detailed below   INTERVAL HISTORY: [name] [name] returns alone today for followup of her locally advanced left breast cancer. She is currently day 8 cycle 3 of 6 planned q. three-week cycles of  docetaxel/carboplatin given with trastuzumab/pertuzumab given in the  neoadjuvant setting. She receives Neulasta on day 2 for granulocyte support, and these are given through our [facility] office per her request.  [name] [name] "feels terrible" today. She's had an increased amount of fatigue since her treatment last week. She feels very weak and tired today. Her to biggest complaints are increased constipation and a persistent headache. These have both been a problem since her treatment one week ago, and I will note that she has been taking ondansetron twice daily for continued nausea. She also has problems with reflux, but tells me that omeprazole causes diarrhea.    REVIEW OF SYSTEMS: [name] [name] has had no fevers of 100 or above.  She occasionally has episodes of chills. She denies any skin changes or rashes, and has had no abnormal bruising or bleeding. She's had no mouth ulcers or oral sensitivity, although she notes that her voice has been more hoarse than usual. Her appetite is reduced.  She's had some intermittent nausea this past week, but no emesis. She's been taking only half of the recommended dose of oral dexamethasone following treatment. She's also been taking only the ondansetron, but none of the prochlorperazine. She's had no change in urinary habits. She's had no cough or phlegm production. She continues to have shortness of breath with exertion which is stable. She denies any orthopnea, and has had no increased swelling in her feet and ankles. She's had no chest pain or pressure, but notes occasional palpitations which she says might be associated with anxiety.   She has had no dizziness. Her vision is sometimes blurred.    She currently  denies any unusual myalgias, arthralgias, or bony pain, and has had no peripheral neuropathy.  A detailed review of systems is otherwise noncontributory.    PAST MEDICAL HISTORY: Past Medical History  Diagnosis Date  . Chest pain 2009    Consultation-[facility], negative chest CT; nl echo in 2005; h/o palpitations  .  Palpitations   . Degenerative joint disease     + degenerative joint disease of the lumbosacral spine  . Colitis 2010    not IBD  . Herpes simplex type II infection   . Allergic rhinitis   . Hypercholesteremia     "slightly high"  . Anxiety   . Heart murmur     "small"  . GERD (gastroesophageal reflux disease)     "a little"  . Breast cancer 03/31/13    left  . Cancer   . Depression   . Hypertension   . Anemia, unspecified 05/30/2013    PAST SURGICAL HISTORY: Past Surgical History  Procedure Laterality Date  . Colonoscopy  10/2010    proctitis; melanosis coli  . Tubal ligation    . Breast excisional biopsy  04/2003    Left; benign disease  . Right oophorectomy  03/13/2007  . Abdominal hysterectomy  03/13/2007    TAH ?BSO--Dr Cheron Every, [place]  . Eye surgery Left     "fix lazy eye"  . Portacath placement Right 04/21/2013    Procedure: INSERTION PORT-A-CATH;  Surgeon: Kandis Cocking, MD;  Location: [facility];  Service: General;  Laterality: Right;    FAMILY HISTORY Family History  Problem Relation Age of Onset  . Aneurysm Mother     Cerebral  . Hypertension Mother   . Hyperlipidemia Mother   . Stroke Mother   . Coronary artery disease Father   . Hypertension Father   . Hyperlipidemia Father   . Lung cancer Brother   . Cancer Brother     Lung  The patient's father died at the age of 40 from a myocardial infarction. The patient's mother died at the age of 76 from a stroke. The patient had 6 brothers, 2 sisters. One brother has a history of lung cancer in the setting of tobacco abuse. Another brother had multiple myeloma. There is no history of breast or ovarian cancer in the family  GYNECOLOGIC HISTORY:  Menarche age 40, first live birth age 1, the patient is GX P4. She status post abdominal hysterectomy with bilateral salpingo-oophorectomy. She never took hormone replacement.   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 Her husband  [name] works in Designer, fashion/clothing. Daughter Candida Peeling lives at home with the patient. She is not employed. Son Thelia Tanksley lives in [place] and is not employed. Daughter [name] lives in [place] and is a Community education officer. Daughter [name] is a Child psychotherapist in [place]. The patient has 10 grandchildren. She attends the [employer]     ADVANCED DIRECTIVES: Not in place   HEALTH MAINTENANCE:  (Updated 05/30/2013)  History  Substance Use Topics  . Smoking status: Former Smoker -- 0.50 packs/day for 15 years    Types: Cigarettes    Quit date: 07/06/1982  . Smokeless tobacco: Never Used  . Alcohol Use: No     Colonoscopy:2012  PAP:2014   Bone density: Never  Lipid panel: Not on file    Allergies  Allergen Reactions  . Doxycycline Other (See Comments)    Unknown  Current Outpatient Prescriptions  Medication Sig Dispense Refill  . ALPRAZolam (XANAX) 1 MG tablet 1 po TID prn anxiety  90 tablet  0  . atenolol (TENORMIN) 50 MG tablet TAKE 1 TABLET BY MOUTH EVERY DAY FOR PALPITATION  90 tablet  1  . cholestyramine (QUESTRAN) 4 G packet Take 1 packet (4 g total) by mouth 2 (two) times daily as needed (diarrhea).  30 each  1  . citalopram (CELEXA) 40 MG tablet Take 40 mg by mouth every evening.      . clindamycin (CLINDAGEL) 1 % gel Apply topically 2 (two) times daily.  30 g  1  . dexamethasone (DECADRON) 4 MG tablet Take 2 tablets (8 mg total) by mouth 2 (two) times daily with a meal. Take two times a day the day before Taxotere. Then take two times a day starting the day after chemo for 3 days.  30 tablet  1  . diphenhydramine-acetaminophen (TYLENOL PM) 25-500 MG TABS Take 2 tablets by mouth at bedtime.      . lidocaine-prilocaine (EMLA) cream Apply topically as needed.  30 g  0  . LORazepam (ATIVAN) 0.5 MG tablet Take 1 tablet (0.5 mg total) by mouth every 6 (six) hours as needed (Nausea or vomiting).  30 tablet  0  . ondansetron  (ZOFRAN) 8 MG tablet Take 1 tablet (8 mg total) by mouth 2 (two) times daily. Take two times a day starting the day after chemo for 3 days. Then take two times a day as needed for nausea or vomiting.  30 tablet  1  . prochlorperazine (COMPAZINE) 10 MG tablet Take 1 tablet (10 mg total) by mouth every 6 (six) hours as needed (Nausea or vomiting).  30 tablet  1  . valACYclovir (VALTREX) 1000 MG tablet Take 1,000 mg by mouth daily.       No current facility-administered medications for this visit.    OBJECTIVE: Middle-aged [place] American woman who is in no acute distress Filed Vitals:   06/19/13 1121  BP: 119/78  Pulse: 81  Temp: 98.9 F (37.2 C)  Resp: 18     Body mass index is 30.23 kg/(m^2).    ECOG FS:2 Filed Weights   06/19/13 1121  Weight: 159 lb 14.4 oz (72.53 kg)   Physical Exam: HEENT:  Sclerae anicteric.  Oropharynx clear and moist. No ulcerations or evidence of candidiasis. No pharyngeal erythema. NODES:  No cervical or supraclavicular lymphadenopathy palpated.  BREAST EXAM:    Deferred. Axillae are benign bilaterally, and I was unable to palpate any lymphadenopathy today.  LUNGS:  Clear to auscultation bilaterally.  No wheezes or rhonchi.   HEART:  Regular rate and rhythm. Soft systolic murmur, unchanged  ABDOMEN:  Soft, nontender to palpation .  Positive, normoactive bowel sounds.  MSK:  No focal spinal tenderness to palpation. Good range of motion in the upper extremities. EXTREMITIES:  No peripheral edema.   SKIN:   No skin changes noted. No nail dyscrasia noted.  Port is intact with no erythema, edema, or evidence of  cellulitis/infection. NEURO:  Nonfocal. Well oriented.  Fatigued affect.    LAB RESULTS:   Lab Results  Component Value Date   WBC 18.5* 06/19/2013   NEUTROABS 12.2* 06/19/2013   HGB 11.7 06/19/2013   HCT 35.6 06/19/2013   MCV 85.3 06/19/2013   PLT 338 06/19/2013      Chemistry      Component Value Date/Time   NA 139 06/19/2013 1058   NA  139 06/06/2008 0545   K 3.4* 06/19/2013 1058   K 3.8 06/06/2008 0545   CL 104 06/06/2008 0545   CO2 27 06/19/2013 1058   CO2 26 06/06/2008 0545   BUN 19.8 06/19/2013 1058   BUN 6 06/06/2008 0545   CREATININE 1.5* 06/19/2013 1058   CREATININE 0.77 06/06/2008 0545      Component Value Date/Time   CALCIUM 10.1 06/19/2013 1058   CALCIUM 8.7 06/06/2008 0545   ALKPHOS 113 06/19/2013 1058   ALKPHOS 62 04/12/2008 1725   AST 28 06/19/2013 1058   AST 21 04/12/2008 1725   ALT 36 06/19/2013 1058   ALT 28 04/12/2008 1725   BILITOT 0.48 06/19/2013 1058   BILITOT 0.5 04/12/2008 1725       STUDIES:  Echocardiogram on 04/20/2013 showed an ejection fraction of 60-65%.  Mr Breast Bilateral W Wo Contrast  06/12/2013   CLINICAL DATA:  Personal history of left breast cancer. The patient is currently on chemotherapy. Assess for chemotherapy response.  EXAM: BILATERAL BREAST MRI WITH AND WITHOUT CONTRAST  LABS:  Does not apply  TECHNIQUE: Multiplanar, multisequence MR images of both breasts were obtained prior to and following the intravenous administration of 15ml of MultiHance.  THREE-DIMENSIONAL MR IMAGE RENDERING ON INDEPENDENT WORKSTATION:  Three-dimensional MR images were rendered by post-processing of the original MR data on an independent workstation. The three-dimensional MR images were interpreted, and findings are reported in the following complete MRI report for this study. Three dimensional images were evaluated at the independent DynaCad workstation  COMPARISON:  Previous exams  FINDINGS: Breast composition: b.  Scattered fibroglandular tissue  Background parenchymal enhancement: Mild  Right breast: No mass or abnormal enhancement.  Left breast: The previously noted 2 mass/biopsy proven cancer in the upper-outer quadrant left breast are significantly smaller compared to the previous exam. The largest mass is in the superior posterior upper-outer quadrant left breast currently measuring 1.6 cm diameter x 2  cm AP x 1.1 cm CC. The 2nd mass is inferior to the largest mass and currently measures 1.5 cm diameter x 0.8 cm AP x 0.9 cm cc. Both of these previously biopsied proving cancers demonstrate slow persistent kinetics. The previously biopsy proven fat necrosis in the superior anterior left breast is smaller currently measuring 0.8 cm diameter x 0.8 cm AP x 0.5 cm CC.  Lymph nodes: There is significant interval decrease in size of left axillary lymph nodes. The largest lymph node currently measures measures 1 cm.  Ancillary findings:  None.  IMPRESSION: Previously biopsy proven cancers in the upper-outer quadrant left breast are significantly smaller compared to the prior exam. The previously noted enlarged lymph nodes in the left axilla are significantly smaller, largest measures 1 cm currently.  RECOMMENDATION: Treatment plan  BI-RADS CATEGORY  6: Known biopsy-proven malignancy - appropriate action should be taken.   Electronically Signed   By: Sherian Rein M.D.   On: 06/12/2013 12:38     ASSESSMENT: 52 y.o. [place], [place] woman   (1)  status post left breast biopsy 03/29/2013 for a clinical T2-T3 pN1, stage IIB-IIIA invasive ductal carcinoma, grade 2-3, estrogen receptor 39% positive (with moderate staining intensity), progesterone receptor negative, with an MIB-1 of 83%, and HER-2 amplified by CISH, with a ratio of 7.5 and 15 HER-2 copies per cell  (2) receiving neoadjuvant chemotherapy, with trastuzumab/ pertuzumab/ carboplatin/docetaxel started 05/01/2013, to be repeated every 3 weeks x6. The patient will receive [facility] 2 [facility] per her request.  PLAN: Over half of our 30 minute appointment today was spent in addressing Mileydi's concerns, reviewing possible treatments, reviewing her recent MRI results, and coordinating care.   I will note that the breast MRI last week that showed significant decrease in the size of the breast masses and the left axillary lymph node. She is  pleased with those results, and is scheduled to meet with her surgeon in early January.  She scheduled to return in 2 weeks on December 29 in anticipation of day 1 cycle 4 of neoadjuvant chemotherapy. I am concerned that the ondansetron might be one of the culprit for both her constipation and her headaches. Accordingly, she will make no changes to her current dose of dexamethasone. I have, however, suggested that she replace the ondansetron with prochlorperazine, one tablet 4 times daily for 3 days following chemotherapy, then every 6 hours as needed. If she does need the ondansetron for additional nausea control, I suggested that she take it along with 12.5 mg of Benadryl to see if this helps prevent the headache.  Hopefully the decreasing ondansetron will also improve her constipation, but if not she knows to take Colace and/or MiraLAX as needed to keep her bowels moving regularly.  She does not want a prescription for an acid reducer at this time, since omeprazole has caused diarrhea in the past. She is going to try an over-the-counter product such as Zantac to see if this helps with the heartburn.  All of this was reviewed with the patient today. She voices understanding and agreement with our plan, and will call with any changes or problems prior to her appointment here next week.   Drayson Dorko, PA-C   06/19/2013 3:07 PM

## 2013-07-03 ENCOUNTER — Telehealth: Payer: Self-pay | Admitting: *Deleted

## 2013-07-03 ENCOUNTER — Telehealth: Payer: Self-pay | Admitting: Oncology

## 2013-07-03 ENCOUNTER — Ambulatory Visit (HOSPITAL_COMMUNITY)
Admission: RE | Admit: 2013-07-03 | Discharge: 2013-07-03 | Disposition: A | Discharge status: Home / Self Care | Payer: BC Managed Care – PPO | Source: Ambulatory Visit | Attending: Physician Assistant | Admitting: Physician Assistant

## 2013-07-03 ENCOUNTER — Ambulatory Visit (HOSPITAL_BASED_OUTPATIENT_CLINIC_OR_DEPARTMENT_OTHER): Payer: BC Managed Care – PPO | Admitting: Physician Assistant

## 2013-07-03 ENCOUNTER — Other Ambulatory Visit: Payer: Self-pay | Admitting: *Deleted

## 2013-07-03 ENCOUNTER — Other Ambulatory Visit: Payer: BC Managed Care – PPO | Admitting: Lab

## 2013-07-03 ENCOUNTER — Ambulatory Visit: Payer: BC Managed Care – PPO

## 2013-07-03 ENCOUNTER — Ambulatory Visit (HOSPITAL_BASED_OUTPATIENT_CLINIC_OR_DEPARTMENT_OTHER): Payer: BC Managed Care – PPO

## 2013-07-03 ENCOUNTER — Encounter: Payer: Self-pay | Admitting: Physician Assistant

## 2013-07-03 ENCOUNTER — Other Ambulatory Visit (HOSPITAL_BASED_OUTPATIENT_CLINIC_OR_DEPARTMENT_OTHER): Payer: BC Managed Care – PPO

## 2013-07-03 VITALS — BP 107/70 | HR 94 | Temp 100.7°F | Resp 20 | Ht 61.0 in | Wt 166.2 lb

## 2013-07-03 DIAGNOSIS — R509 Fever, unspecified: Secondary | ICD-10-CM

## 2013-07-03 DIAGNOSIS — F411 Generalized anxiety disorder: Secondary | ICD-10-CM

## 2013-07-03 DIAGNOSIS — D696 Thrombocytopenia, unspecified: Secondary | ICD-10-CM

## 2013-07-03 DIAGNOSIS — C50919 Malignant neoplasm of unspecified site of unspecified female breast: Secondary | ICD-10-CM | POA: Insufficient documentation

## 2013-07-03 DIAGNOSIS — G8929 Other chronic pain: Secondary | ICD-10-CM

## 2013-07-03 DIAGNOSIS — R0602 Shortness of breath: Secondary | ICD-10-CM

## 2013-07-03 DIAGNOSIS — C50412 Malignant neoplasm of upper-outer quadrant of left female breast: Secondary | ICD-10-CM

## 2013-07-03 DIAGNOSIS — R05 Cough: Secondary | ICD-10-CM

## 2013-07-03 DIAGNOSIS — R059 Cough, unspecified: Secondary | ICD-10-CM

## 2013-07-03 DIAGNOSIS — C50419 Malignant neoplasm of upper-outer quadrant of unspecified female breast: Secondary | ICD-10-CM

## 2013-07-03 DIAGNOSIS — N390 Urinary tract infection, site not specified: Secondary | ICD-10-CM

## 2013-07-03 DIAGNOSIS — M549 Dorsalgia, unspecified: Secondary | ICD-10-CM

## 2013-07-03 DIAGNOSIS — D649 Anemia, unspecified: Secondary | ICD-10-CM

## 2013-07-03 DIAGNOSIS — R51 Headache: Secondary | ICD-10-CM

## 2013-07-03 LAB — CBC WITH DIFFERENTIAL/PLATELET
BASO%: 0.2 % (ref 0.0–2.0)
Basophils Absolute: 0 10*3/uL (ref 0.0–0.1)
EOS%: 0 % (ref 0.0–7.0)
Eosinophils Absolute: 0 10*3/uL (ref 0.0–0.5)
HCT: 26.1 % — ABNORMAL LOW (ref 34.8–46.6)
HGB: 8.6 g/dL — ABNORMAL LOW (ref 11.6–15.9)
LYMPH%: 10.6 % — ABNORMAL LOW (ref 14.0–49.7)
MCH: 28.1 pg (ref 25.1–34.0)
MCHC: 33 g/dL (ref 31.5–36.0)
MCV: 85.3 fL (ref 79.5–101.0)
MONO#: 1.2 10*3/uL — ABNORMAL HIGH (ref 0.1–0.9)
MONO%: 14.8 % — ABNORMAL HIGH (ref 0.0–14.0)
NEUT#: 6.2 10*3/uL (ref 1.5–6.5)
NEUT%: 74.4 % (ref 38.4–76.8)
Platelets: 71 10*3/uL — ABNORMAL LOW (ref 145–400)
RBC: 3.06 10*6/uL — ABNORMAL LOW (ref 3.70–5.45)
RDW: 17.4 % — ABNORMAL HIGH (ref 11.2–14.5)
WBC: 8.3 10*3/uL (ref 3.9–10.3)
lymph#: 0.9 10*3/uL (ref 0.9–3.3)

## 2013-07-03 LAB — URINALYSIS, MICROSCOPIC - CHCC
Bilirubin (Urine): NEGATIVE
Glucose: NEGATIVE mg/dL
Ketones: NEGATIVE mg/dL
Nitrite: NEGATIVE
Protein: NEGATIVE mg/dL
Specific Gravity, Urine: 1.005 (ref 1.003–1.035)
Urobilinogen, UR: 0.2 mg/dL (ref 0.2–1)
pH: 8 (ref 4.6–8.0)

## 2013-07-03 MED ORDER — CIPROFLOXACIN HCL 500 MG PO TABS: 500.0000 mg | ORAL_TABLET | Freq: Two times a day (BID) | ORAL | Status: DC

## 2013-07-03 NOTE — Telephone Encounter (Signed)
Per staff message and POF I have scheduled appts.  JMW  

## 2013-07-03 NOTE — Telephone Encounter (Signed)
Per PA request. Called pt to inform her of chest x-ray and urinalysis results. Informed patient to keep taking the Cipro just in case there may be an early UTI.Told her the urine will be sent for a culture as well. Pt verbalized understanding and had no further questions. Message will be forwarded to Zollie Scale, PA-C.

## 2013-07-03 NOTE — Progress Notes (Signed)
ID: [name] OB: 03/02/1961  MR#: [id number]  WUJ#:[id number]  PCP: Harlow Asa, MD ; Sherie Don, NP GYN:   Conley Simmonds, MD  SU: Ovidio Kin, MD RADONC:  Lurline Hare, MD OTHER MD: Regino Schultze, MD;  Estelle Grumbles, MD  CHIEF COMPLAINT:  Left Breast Cancer/Neoadjuvant Chemo  HISTORY OF PRESENT ILLNESS: [name] [name] palpated a mass in her left breast in late August 2014. She was already scheduled for screening mammography at [facility] and this was performed 03/13/2013. Indeed a possible mass was noted in the left breast and on 03/29/2013 the patient underwent diagnostic left mammography and left ultrasonography. This showed a 2.5 cm irregular mass in the upper outer quadrant of the left breast. This was palpable.by ultrasound there was a 1.6 cm irregular hypoechoic mass in the area in question, and in addition to enlarged left axillary lymph nodes were noted, the largest measuring 2.5 cm.  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  Bilateral breast MRI 04/11/2013 shows 3 abnormal enhancing masses in the upper outer quadrant of the left breast measuring altogether 6.4 cm. The largest separate mass measured 2.7 cm. There were abnormal or large lymph nodes in the left axilla. Ultrasound guided biopsy of the 2 smaller masses has been scheduled.  The patient's subsequent history is as detailed below   INTERVAL HISTORY: [name] [name] returns today accompanied by her husband for followup of her locally advanced left breast cancer. She is due for day 1 cycle 4 of 6 planned q. three-week cycles of neoadjuvant docetaxel/carboplatin given with  trastuzumab/pertuzumab. She receives Neulasta on day 2 for granulocyte support, and these are given through our [facility] office per her request.  [name] [name] has had a difficult time recovering since cycle 3. She tells me she has been running "low grade fevers" for the past week, with the highest recording temp being 102.1 two days ago. In the office today, her temperature is elevated at 100.7. Fortunately, she is not neutropenic today, with an adequate ANC of 6.2.  Her appetite is decreased, but she denies any nausea, emesis, or change in bowel habits. She's had an increased cough which is dry and nonproductive. She continues to have some shortness of breath which is stable. She has chronic back pain which is stable. She continues to have headaches which have not worsened, and she denies any change in vision or dizziness.  REVIEW OF SYSTEMS: [name] [name] denies any skin changes or rashes, and has had no abnormal bruising or bleeding. She's had no mouth ulcers or oral sensitivity, and denies any problems swallowing.  She denies any orthopnea, and has had no increased swelling in her feet and ankles. She's had no chest pain or pressure.    She currently denies any unusual myalgias, arthralgias, or bony pain,  other than the back pain noted above, and has had no peripheral  swelling or neuropathy.  A detailed review of systems is otherwise noncontributory.    PAST MEDICAL HISTORY: Past Medical History  Diagnosis Date  . Chest pain 2009    Consultation-[facility], negative chest CT; nl echo in 2005; h/o palpitations  . Palpitations   . Degenerative joint  disease     + degenerative joint disease of the lumbosacral spine  . Colitis 2010    not IBD  . Herpes simplex type II infection   . Allergic rhinitis   . Hypercholesteremia     "slightly high"  . Anxiety   . Heart murmur     "small"  . GERD (gastroesophageal reflux disease)     "a little"  . Breast cancer 03/31/13    left  . Cancer   .  Depression   . Hypertension   . Anemia, unspecified 05/30/2013    PAST SURGICAL HISTORY: Past Surgical History  Procedure Laterality Date  . Colonoscopy  10/2010    proctitis; melanosis coli  . Tubal ligation    . Breast excisional biopsy  04/2003    Left; benign disease  . Right oophorectomy  03/13/2007  . Abdominal hysterectomy  03/13/2007    TAH ?BSO--Dr Cheron Every, [place]  . Eye surgery Left     "fix lazy eye"  . Portacath placement Right 04/21/2013    Procedure: INSERTION PORT-A-CATH;  Surgeon: Kandis Cocking, MD;  Location: [facility];  Service: General;  Laterality: Right;    FAMILY HISTORY Family History  Problem Relation Age of Onset  . Aneurysm Mother     Cerebral  . Hypertension Mother   . Hyperlipidemia Mother   . Stroke Mother   . Coronary artery disease Father   . Hypertension Father   . Hyperlipidemia Father   . Lung cancer Brother   . Cancer Brother     Lung  The patient's father died at the age of 16 from a myocardial infarction. The patient's mother died at the age of 32 from a stroke. The patient had 6 brothers, 2 sisters. One brother has a history of lung cancer in the setting of tobacco abuse. Another brother had multiple myeloma. There is no history of breast or ovarian cancer in the family  GYNECOLOGIC HISTORY:  Menarche age 55, first live birth age 74, the patient is GX P4. She status post abdominal hysterectomy with bilateral salpingo-oophorectomy. She never took hormone replacement.   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 Her husband [name] works in Designer, fashion/clothing. Daughter Candida Peeling lives at home with the patient. She is not employed. Son Jizel Cheeks lives in [place] and is not employed. Daughter [name] lives in [place] and is a Community education officer. Daughter [name] is a Child psychotherapist in [place]. The patient has 10 grandchildren. She attends the [employer]     ADVANCED DIRECTIVES: Not in place   HEALTH MAINTENANCE:  (Updated 05/30/2013)  History  Substance Use Topics  . Smoking status: Former Smoker -- 0.50 packs/day for 15 years    Types: Cigarettes    Quit date: 07/06/1982  . Smokeless tobacco: Never Used  . Alcohol Use: No     Colonoscopy:2012  PAP:2014   Bone density: Never  Lipid panel: Not on file    Allergies  Allergen Reactions  . Doxycycline Other (See Comments)    Unknown    Current Outpatient Prescriptions  Medication Sig Dispense Refill  . ALPRAZolam (XANAX) 1 MG tablet 1 po TID prn anxiety  90 tablet  0  . atenolol (TENORMIN) 50 MG tablet TAKE 1 TABLET BY MOUTH EVERY DAY FOR PALPITATION  90 tablet  1  . citalopram (CELEXA) 40 MG tablet Take 40 mg by mouth every evening.      Marland Kitchen  clindamycin (CLINDAGEL) 1 % gel Apply topically 2 (two) times daily.  30 g  1  . dexamethasone (DECADRON) 4 MG tablet Take 2 tablets (8 mg total) by mouth 2 (two) times daily with a meal. Take two times a day the day before Taxotere. Then take two times a day starting the day after chemo for 3 days.  30 tablet  1  . diphenhydramine-acetaminophen (TYLENOL PM) 25-500 MG TABS Take 2 tablets by mouth at bedtime.      . lidocaine-prilocaine (EMLA) cream Apply topically as needed.  30 g  0  . LORazepam (ATIVAN) 0.5 MG tablet Take 1 tablet (0.5 mg total) by mouth every 6 (six) hours as needed (Nausea or vomiting).  30 tablet  0  . ondansetron (ZOFRAN) 8 MG tablet Take 1 tablet (8 mg total) by mouth 2 (two) times daily. Take two times a day starting the day after chemo for 3 days. Then take two times a day as needed for nausea or vomiting.  30 tablet  1  . valACYclovir (VALTREX) 1000 MG tablet Take 1,000 mg by mouth daily.      . cholestyramine (QUESTRAN) 4 G packet Take 1 packet (4 g total) by mouth 2 (two) times daily as needed (diarrhea).  30 each  1  . ciprofloxacin (CIPRO) 500 MG tablet Take 1 tablet (500 mg total) by mouth 2 (two) times  daily.  14 tablet  0  . prochlorperazine (COMPAZINE) 10 MG tablet Take 1 tablet (10 mg total) by mouth every 6 (six) hours as needed (Nausea or vomiting).  30 tablet  1   No current facility-administered medications for this visit.    OBJECTIVE: Middle-aged [place] American woman who appears weak and tired but is  in no acute distress Filed Vitals:   07/03/13 0914  BP: 107/70  Pulse: 94  Temp: 100.7 F (38.2 C)  Resp: 20     Body mass index is 31.42 kg/(m^2).    ECOG FS:2 Filed Weights   07/03/13 0914  Weight: 166 lb 3.2 oz (75.388 kg)   Physical Exam: HEENT:  Sclerae anicteric.  Oropharynx clear and moist. No ulcerations or oropharyngeal  candidiasis.  Neck is supple.  NODES:  No cervical or supraclavicular lymphadenopathy palpated.  BREAST EXAM:    Deferred. Axillae are benign bilaterally, and I was unable to palpate any lymphadenopathy today.  LUNGS:  Clear to auscultation bilaterally With good excursion.  No wheezes or rhonchi.   HEART:  Regular rate and rhythm. Systolic murmur, unchanged  ABDOMEN:  Soft, nontender to palpation .  Positive bowel sounds.  MSK:  No focal spinal tenderness to palpation. Good range of motion in the upper extremities. EXTREMITIES:  No peripheral edema.   SKIN:   No skin changes noted. No nail dyscrasia noted.  Port is intact with no erythema, edema, or evidence of  cellulitis/infection. NEURO:  Nonfocal. Well oriented.  Fatigued affect.    LAB RESULTS:   Lab Results  Component Value Date   WBC 8.3 07/03/2013   NEUTROABS 6.2 07/03/2013   HGB 8.6* 07/03/2013   HCT 26.1* 07/03/2013   MCV 85.3 07/03/2013   PLT 71* 07/03/2013      Chemistry      Component Value Date/Time   NA 139 06/19/2013 1058   NA 139 06/06/2008 0545   K 3.4* 06/19/2013 1058   K 3.8 06/06/2008 0545   CL 104 06/06/2008 0545   CO2 27 06/19/2013 1058   CO2 26 06/06/2008 0545  BUN 19.8 06/19/2013 1058   BUN 6 06/06/2008 0545   CREATININE 1.5* 06/19/2013 1058    CREATININE 0.77 06/06/2008 0545      Component Value Date/Time   CALCIUM 10.1 06/19/2013 1058   CALCIUM 8.7 06/06/2008 0545   ALKPHOS 113 06/19/2013 1058   ALKPHOS 62 04/12/2008 1725   AST 28 06/19/2013 1058   AST 21 04/12/2008 1725   ALT 36 06/19/2013 1058   ALT 28 04/12/2008 1725   BILITOT 0.48 06/19/2013 1058   BILITOT 0.5 04/12/2008 1725       STUDIES:  DG Chest 2 View  07/03/2013   CLINICAL DATA: Breast cancer with new onset cough and shortness of  breath.  EXAM:  CHEST 2 VIEW  COMPARISON: CT chest 04/26/2013 and chest radiograph 04/21/2013.  FINDINGS:  Trachea is midline. Heart size normal. Right subclavian Port-A-Cath  tip is in the SVC. Lungs are clear. No pleural fluid.  IMPRESSION:  No acute findings.  Electronically Signed  By: Leanna Battles M.D.  On: 07/03/2013 11:01   Echocardiogram on 04/20/2013 showed an ejection fraction of 60-65%.  This is due to be repeated in late January.   Mr Breast Bilateral W Wo Contrast  06/12/2013   CLINICAL DATA:  Personal history of left breast cancer. The patient is currently on chemotherapy. Assess for chemotherapy response.  EXAM: BILATERAL BREAST MRI WITH AND WITHOUT CONTRAST  LABS:  Does not apply  TECHNIQUE: Multiplanar, multisequence MR images of both breasts were obtained prior to and following the intravenous administration of 15ml of MultiHance.  THREE-DIMENSIONAL MR IMAGE RENDERING ON INDEPENDENT WORKSTATION:  Three-dimensional MR images were rendered by post-processing of the original MR data on an independent workstation. The three-dimensional MR images were interpreted, and findings are reported in the following complete MRI report for this study. Three dimensional images were evaluated at the independent DynaCad workstation  COMPARISON:  Previous exams  FINDINGS: Breast composition: b.  Scattered fibroglandular tissue  Background parenchymal enhancement: Mild  Right breast: No mass or abnormal enhancement.  Left breast: The  previously noted 2 mass/biopsy proven cancer in the upper-outer quadrant left breast are significantly smaller compared to the previous exam. The largest mass is in the superior posterior upper-outer quadrant left breast currently measuring 1.6 cm diameter x 2 cm AP x 1.1 cm CC. The 2nd mass is inferior to the largest mass and currently measures 1.5 cm diameter x 0.8 cm AP x 0.9 cm cc. Both of these previously biopsied proving cancers demonstrate slow persistent kinetics. The previously biopsy proven fat necrosis in the superior anterior left breast is smaller currently measuring 0.8 cm diameter x 0.8 cm AP x 0.5 cm CC.  Lymph nodes: There is significant interval decrease in size of left axillary lymph nodes. The largest lymph node currently measures measures 1 cm.  Ancillary findings:  None.  IMPRESSION: Previously biopsy proven cancers in the upper-outer quadrant left breast are significantly smaller compared to the prior exam. The previously noted enlarged lymph nodes in the left axilla are significantly smaller, largest measures 1 cm currently.  RECOMMENDATION: Treatment plan  BI-RADS CATEGORY  6: Known biopsy-proven malignancy - appropriate action should be taken.   Electronically Signed   By: Sherian Rein M.D.   On: 06/12/2013 12:38     ASSESSMENT: 52 y.o. [place], [place] woman   (1)  status post left breast biopsy 03/29/2013 for a clinical T2-T3 pN1, stage IIB-IIIA invasive ductal carcinoma, grade 2-3, estrogen receptor 39% positive (with moderate staining intensity), progesterone  receptor negative, with an MIB-1 of 83%, and HER-2 amplified by CISH, with a ratio of 7.5 and 15 HER-2 copies per cell  (2) receiving neoadjuvant chemotherapy, with trastuzumab/ pertuzumab/ carboplatin/docetaxel started 05/01/2013, to be repeated every 3 weeks x6. The patient will receive Neulasta on day 2 at [facility] per her request.     PLAN: We're going to hold [place] as treatment today, partially due to a  febrile illness, and also due to thrombocytopenia. We will continue to follow her labs very closely. Fortunately, the chest x-ray which is detailed above showed no evidence of infection process and was totally stable. We are obtaining a urinalysis and culture for further evaluation as well.  Fortunately, she is not neutropenic at this time. With the ongoing fever, however, I am going to start her on Cipro at 500 mg twice daily for 7 days.   I will plan on seeing patient again next Monday, January 5, and we will hope to proceed with cycle 4 of her neoadjuvant chemotherapy at that time. She is already scheduled to see Dr. Ezzard Standing on January 7, and will be seeing Dr. Darnelle Catalan for assessment chemotoxicity on January 12. She will continue to receive her Neulasta injections on day 2 of each cycle at [facility].  I have asked [name] [name] to contact us with any spikes in temperature, or if any of her other complaints worsen. She and her husband both voice understanding and agreement with this plan and will call with any changes or problems.  Of note, her next echocardiogram will be due in late January, and that has been requested.   Zollie Scale, PA-C   07/03/2013 4:49 PM

## 2013-07-04 ENCOUNTER — Telehealth: Payer: Self-pay | Admitting: Oncology

## 2013-07-04 ENCOUNTER — Ambulatory Visit (HOSPITAL_COMMUNITY): Payer: BC Managed Care – PPO

## 2013-07-04 LAB — URINE CULTURE

## 2013-07-04 NOTE — Telephone Encounter (Signed)
, °

## 2013-07-07 ENCOUNTER — Other Ambulatory Visit (HOSPITAL_BASED_OUTPATIENT_CLINIC_OR_DEPARTMENT_OTHER): Payer: BC Managed Care – PPO

## 2013-07-07 DIAGNOSIS — C50419 Malignant neoplasm of upper-outer quadrant of unspecified female breast: Secondary | ICD-10-CM

## 2013-07-07 DIAGNOSIS — C50412 Malignant neoplasm of upper-outer quadrant of left female breast: Secondary | ICD-10-CM

## 2013-07-07 LAB — CBC WITH DIFFERENTIAL/PLATELET
BASO%: 0.6 % (ref 0.0–2.0)
Basophils Absolute: 0 10*3/uL (ref 0.0–0.1)
EOS%: 1.4 % (ref 0.0–7.0)
Eosinophils Absolute: 0.1 10*3/uL (ref 0.0–0.5)
HCT: 27.8 % — ABNORMAL LOW (ref 34.8–46.6)
HGB: 9.2 g/dL — ABNORMAL LOW (ref 11.6–15.9)
LYMPH%: 32 % (ref 14.0–49.7)
MCH: 28.8 pg (ref 25.1–34.0)
MCHC: 33 g/dL (ref 31.5–36.0)
MCV: 87.4 fL (ref 79.5–101.0)
MONO#: 0.7 10*3/uL (ref 0.1–0.9)
MONO%: 14.1 % — ABNORMAL HIGH (ref 0.0–14.0)
NEUT#: 2.5 10*3/uL (ref 1.5–6.5)
NEUT%: 51.9 % (ref 38.4–76.8)
Platelets: 80 10*3/uL — ABNORMAL LOW (ref 145–400)
RBC: 3.18 10*6/uL — ABNORMAL LOW (ref 3.70–5.45)
RDW: 18.6 % — ABNORMAL HIGH (ref 11.2–14.5)
WBC: 4.9 10*3/uL (ref 3.9–10.3)
lymph#: 1.6 10*3/uL (ref 0.9–3.3)

## 2013-07-10 ENCOUNTER — Ambulatory Visit (HOSPITAL_BASED_OUTPATIENT_CLINIC_OR_DEPARTMENT_OTHER): Payer: BC Managed Care – PPO

## 2013-07-10 ENCOUNTER — Encounter: Payer: Self-pay | Admitting: Physician Assistant

## 2013-07-10 ENCOUNTER — Other Ambulatory Visit: Payer: Self-pay | Admitting: Oncology

## 2013-07-10 ENCOUNTER — Telehealth: Payer: Self-pay | Admitting: *Deleted

## 2013-07-10 ENCOUNTER — Telehealth: Payer: Self-pay | Admitting: Oncology

## 2013-07-10 ENCOUNTER — Other Ambulatory Visit (HOSPITAL_BASED_OUTPATIENT_CLINIC_OR_DEPARTMENT_OTHER): Payer: BC Managed Care – PPO

## 2013-07-10 ENCOUNTER — Ambulatory Visit (HOSPITAL_BASED_OUTPATIENT_CLINIC_OR_DEPARTMENT_OTHER): Payer: BC Managed Care – PPO | Admitting: Physician Assistant

## 2013-07-10 VITALS — BP 115/74 | HR 74 | Temp 98.0°F | Resp 18 | Ht 61.0 in | Wt 158.6 lb

## 2013-07-10 DIAGNOSIS — Z5111 Encounter for antineoplastic chemotherapy: Secondary | ICD-10-CM

## 2013-07-10 DIAGNOSIS — C50419 Malignant neoplasm of upper-outer quadrant of unspecified female breast: Secondary | ICD-10-CM

## 2013-07-10 DIAGNOSIS — C50412 Malignant neoplasm of upper-outer quadrant of left female breast: Secondary | ICD-10-CM

## 2013-07-10 DIAGNOSIS — D696 Thrombocytopenia, unspecified: Secondary | ICD-10-CM

## 2013-07-10 DIAGNOSIS — L600 Ingrowing nail: Secondary | ICD-10-CM

## 2013-07-10 DIAGNOSIS — Z5112 Encounter for antineoplastic immunotherapy: Secondary | ICD-10-CM

## 2013-07-10 DIAGNOSIS — D649 Anemia, unspecified: Secondary | ICD-10-CM

## 2013-07-10 DIAGNOSIS — Z171 Estrogen receptor negative status [ER-]: Secondary | ICD-10-CM

## 2013-07-10 DIAGNOSIS — L03039 Cellulitis of unspecified toe: Secondary | ICD-10-CM | POA: Insufficient documentation

## 2013-07-10 DIAGNOSIS — L03032 Cellulitis of left toe: Secondary | ICD-10-CM

## 2013-07-10 LAB — CBC WITH DIFFERENTIAL/PLATELET
BASO%: 0.2 % (ref 0.0–2.0)
Basophils Absolute: 0 10*3/uL (ref 0.0–0.1)
EOS%: 1.6 % (ref 0.0–7.0)
Eosinophils Absolute: 0.1 10*3/uL (ref 0.0–0.5)
HCT: 28.1 % — ABNORMAL LOW (ref 34.8–46.6)
HGB: 9.3 g/dL — ABNORMAL LOW (ref 11.6–15.9)
LYMPH%: 32.8 % (ref 14.0–49.7)
MCH: 28.2 pg (ref 25.1–34.0)
MCHC: 33.1 g/dL (ref 31.5–36.0)
MCV: 85.2 fL (ref 79.5–101.0)
MONO#: 0.6 10*3/uL (ref 0.1–0.9)
MONO%: 9.2 % (ref 0.0–14.0)
NEUT#: 3.5 10*3/uL (ref 1.5–6.5)
NEUT%: 56.2 % (ref 38.4–76.8)
Platelets: 134 10*3/uL — ABNORMAL LOW (ref 145–400)
RBC: 3.3 10*6/uL — ABNORMAL LOW (ref 3.70–5.45)
RDW: 18.1 % — ABNORMAL HIGH (ref 11.2–14.5)
WBC: 6.2 10*3/uL (ref 3.9–10.3)
lymph#: 2 10*3/uL (ref 0.9–3.3)

## 2013-07-10 LAB — COMPREHENSIVE METABOLIC PANEL (CC13)
ALT: 28 U/L (ref 0–55)
AST: 25 U/L (ref 5–34)
Albumin: 3.6 g/dL (ref 3.5–5.0)
Alkaline Phosphatase: 59 U/L (ref 40–150)
Anion Gap: 9 mEq/L (ref 3–11)
BUN: 7.8 mg/dL (ref 7.0–26.0)
CO2: 26 mEq/L (ref 22–29)
Calcium: 9.1 mg/dL (ref 8.4–10.4)
Chloride: 107 mEq/L (ref 98–109)
Creatinine: 0.8 mg/dL (ref 0.6–1.1)
Glucose: 84 mg/dl (ref 70–140)
Potassium: 3.5 mEq/L (ref 3.5–5.1)
Sodium: 142 mEq/L (ref 136–145)
Total Bilirubin: 0.36 mg/dL (ref 0.20–1.20)
Total Protein: 7 g/dL (ref 6.4–8.3)

## 2013-07-10 MED ORDER — CEPHALEXIN 500 MG PO CAPS: 500.0000 mg | ORAL_CAPSULE | Freq: Three times a day (TID) | ORAL | Status: DC

## 2013-07-10 MED ORDER — ACETAMINOPHEN 325 MG PO TABS
ORAL_TABLET | ORAL | Status: AC
  Filled 2013-07-10: qty 2

## 2013-07-10 MED ORDER — DEXAMETHASONE SODIUM PHOSPHATE 20 MG/5ML IJ SOLN
INTRAMUSCULAR | Status: AC
  Filled 2013-07-10: qty 5

## 2013-07-10 MED ORDER — SODIUM CHLORIDE 0.9 % IV SOLN
420.0000 mg | Freq: Once | INTRAVENOUS | Status: AC
  Administered 2013-07-10: 420 mg via INTRAVENOUS
  Filled 2013-07-10: qty 14

## 2013-07-10 MED ORDER — DIPHENHYDRAMINE HCL 25 MG PO CAPS
ORAL_CAPSULE | ORAL | Status: AC
  Filled 2013-07-10: qty 1

## 2013-07-10 MED ORDER — ONDANSETRON 16 MG/50ML IVPB (CHCC)
INTRAVENOUS | Status: AC
  Filled 2013-07-10: qty 16

## 2013-07-10 MED ORDER — TRASTUZUMAB CHEMO INJECTION 440 MG
6.0000 mg/kg | Freq: Once | INTRAVENOUS | Status: AC
  Administered 2013-07-10: 462 mg via INTRAVENOUS
  Filled 2013-07-10: qty 22

## 2013-07-10 MED ORDER — DEXAMETHASONE SODIUM PHOSPHATE 20 MG/5ML IJ SOLN
20.0000 mg | Freq: Once | INTRAMUSCULAR | Status: AC
  Administered 2013-07-10: 20 mg via INTRAVENOUS

## 2013-07-10 MED ORDER — SODIUM CHLORIDE 0.9 % IV SOLN
75.0000 mg/m2 | Freq: Once | INTRAVENOUS | Status: AC
  Administered 2013-07-10: 130 mg via INTRAVENOUS
  Filled 2013-07-10: qty 13

## 2013-07-10 MED ORDER — SODIUM CHLORIDE 0.9 % IV SOLN
Freq: Once | INTRAVENOUS | Status: AC
  Administered 2013-07-10: 13:00:00 via INTRAVENOUS

## 2013-07-10 MED ORDER — HEPARIN SOD (PORK) LOCK FLUSH 100 UNIT/ML IV SOLN
500.0000 [IU] | Freq: Once | INTRAVENOUS | Status: AC | PRN
  Administered 2013-07-10: 500 [IU]
  Filled 2013-07-10: qty 5

## 2013-07-10 MED ORDER — SODIUM CHLORIDE 0.9 % IJ SOLN
10.0000 mL | INTRAMUSCULAR | Status: DC | PRN
  Administered 2013-07-10: 10 mL
  Filled 2013-07-10: qty 10

## 2013-07-10 MED ORDER — ONDANSETRON 16 MG/50ML IVPB (CHCC)
16.0000 mg | Freq: Once | INTRAVENOUS | Status: AC
  Administered 2013-07-10: 16 mg via INTRAVENOUS

## 2013-07-10 MED ORDER — DIPHENHYDRAMINE HCL 25 MG PO CAPS
25.0000 mg | ORAL_CAPSULE | Freq: Once | ORAL | Status: AC
  Administered 2013-07-10: 25 mg via ORAL

## 2013-07-10 MED ORDER — ACETAMINOPHEN 325 MG PO TABS
650.0000 mg | ORAL_TABLET | Freq: Once | ORAL | Status: AC
  Administered 2013-07-10: 650 mg via ORAL

## 2013-07-10 MED ORDER — SODIUM CHLORIDE 0.9 % IV SOLN
732.6000 mg | Freq: Once | INTRAVENOUS | Status: AC
  Administered 2013-07-10: 730 mg via INTRAVENOUS
  Filled 2013-07-10: qty 73

## 2013-07-10 NOTE — Progress Notes (Signed)
ID: [name] [name] OB: 05/26/1961  MR#: [id number]  WCB#:[id number]  PCP: Rubbie Battiest, MD ; Pearson Forster, NP GYN:   Josefa Half, MD  SU: Alphonsa Overall, MD [place]:  Thea Silversmith, MD OTHER MD: Freida Busman, MD;  Neysa Bonito, MD  CHIEF COMPLAINT:  Left Breast Cancer/Neoadjuvant Chemo  HISTORY OF PRESENT ILLNESS: [name] [name] palpated a mass in her left breast in late August 2014. She was already scheduled for screening mammography at [facility] and this was performed 03/13/2013. Indeed a possible mass was noted in the left breast and on 03/29/2013 the patient underwent diagnostic left mammography and left ultrasonography. This showed a 2.5 cm irregular mass in the upper outer quadrant of the left breast. This was palpable.by ultrasound there was a 1.6 cm irregular hypoechoic mass in the area in question, and in addition to enlarged left axillary lymph nodes were noted, the largest measuring 2.5 cm.  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  Bilateral breast MRI 04/11/2013 shows 3 abnormal enhancing masses in the upper outer quadrant of the left breast measuring altogether 6.4 cm. The largest separate mass measured 2.7 cm. There were abnormal or large lymph nodes in the left axilla. Ultrasound guided biopsy of the 2 smaller masses has been scheduled.  The patient's subsequent history is as detailed below   INTERVAL HISTORY: [name] [name] returns alone today for followup of her locally advanced left breast cancer. She is due for day 1 cycle 4 of 6 planned q. three-week cycles of neoadjuvant docetaxel/carboplatin given with trastuzumab/pertuzumab.   She  receives Neulasta on day 2 for granulocyte support, and these are given through our [facility] office per her request.  Treatment was held one week ago due to illness. Rip Harbour had been running "low grade fevers" but fortunately was not neutropenic.  She felt horrible.  Both a chest x-ray and urinalysis/urine culture were unremarkable. She was started on Cipro. Her fever resolved within 24 hours and she tells me she felt better within 3 days. She is feeling well today.  In fact, [name] [name]'s only other complaint today is an "ingrown toenail" on the left great toe. The area is sore to touch. She's had no nail bed changes elsewhere.  REVIEW OF SYSTEMS: [place]'s energy level has improved. She has had no additional skin changes other than hyperpigmentation, and has had no abnormal bruising or bleeding. She's had no mouth ulcers or oral sensitivity, and denies any problems swallowing.  She denies any cough, shortness of breath, orthopnea, peripheral edema, chest pain, or pressure.  She currently denies any unusual myalgias, arthralgias, or bony pain, and has had no peripheral neuropathy.  A detailed review of systems is otherwise noncontributory.    PAST MEDICAL HISTORY: Past Medical History  Diagnosis Date  . Chest pain 2009    Consultation-[facility], negative chest CT; nl echo in 2005; h/o palpitations  . Palpitations   . Degenerative joint disease     + degenerative joint disease of the lumbosacral spine  . Colitis 2010    not IBD  . Herpes simplex type II infection   . Allergic rhinitis   . Hypercholesteremia     "  slightly high"  . Anxiety   . Heart murmur     "small"  . GERD (gastroesophageal reflux disease)     "a little"  . Breast cancer 03/31/13    left  . Cancer   . Depression   . Hypertension   . Anemia, unspecified 05/30/2013    PAST SURGICAL HISTORY: Past Surgical History  Procedure Laterality Date  . Colonoscopy  10/2010    proctitis; melanosis coli  . Tubal ligation     . Breast excisional biopsy  04/2003    Left; benign disease  . Right oophorectomy  03/13/2007  . Abdominal hysterectomy  03/13/2007    TAH ?BSO--Dr Levin Bacon, [place]  . Eye surgery Left     "fix lazy eye"  . Portacath placement Right 04/21/2013    Procedure: INSERTION PORT-A-CATH;  Surgeon: [name], MD;  Location: [facility]S;  Service: General;  Laterality: Right;    FAMILY HISTORY Family History  Problem Relation Age of Onset  . Aneurysm Mother     Cerebral  . Hypertension Mother   . Hyperlipidemia Mother   . Stroke Mother   . Coronary artery disease Father   . Hypertension Father   . Hyperlipidemia Father   . Lung cancer Brother   . Cancer Brother     Lung  The patient's father died at the age of 19 from a myocardial infarction. The patient's mother died at the age of 70 from a stroke. The patient had 6 brothers, 2 sisters. One brother has a history of lung cancer in the setting of tobacco abuse. Another brother had multiple myeloma. There is no history of breast or ovarian cancer in the family  GYNECOLOGIC HISTORY:  Menarche age 27, first live birth age 89, the patient is [place] P4. She status post abdominal hysterectomy with bilateral salpingo-oophorectomy. She never took hormone replacement.   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 Her husband [name] works in Char[employer] fundraiser. Daughter [name] [name] lives at home with the patient. She is not employed. Son [name] [name] lives in [place] and is not employed. Daughter [name] [name] lives in [place] and is a Barrister's clerk. Daughter [name] [name] is a Education officer, museum in [place]. The patient has 10 grandchildren. She attends [employer]     ADVANCED DIRECTIVES: Not in place   HEALTH MAINTENANCE:  (Updated 05/30/2013)  History  Substance Use Topics  . Smoking status: Former Smoker -- 0.50 packs/day for 15 years    Types: Cigarettes    Quit  date: 07/06/1982  . Smokeless tobacco: Never Used  . Alcohol Use: No     Colonoscopy:2012  PAP:2014   Bone density: Never  Lipid panel: Not on file    Allergies  Allergen Reactions  . Doxycycline Other (See Comments)    Unknown    Current Outpatient Prescriptions  Medication Sig Dispense Refill  . ALPRAZolam (XANAX) 1 MG tablet 1 po TID prn anxiety  90 tablet  0  . atenolol (TENORMIN) 50 MG tablet TAKE 1 TABLET BY MOUTH EVERY DAY FOR PALPITATION  90 tablet  1  . citalopram (CELEXA) 40 MG tablet Take 40 mg by mouth every evening.      Marland Kitchen dexamethasone (DECADRON) 4 MG tablet Take 2 tablets (8 mg total) by mouth 2 (two) times daily with a meal. Take two times a day the day before Taxotere. Then take two times a day starting the  day after chemo for 3 days.  30 tablet  1  . diphenhydramine-acetaminophen (TYLENOL PM) 25-500 MG TABS Take 2 tablets by mouth at bedtime.      . lidocaine-prilocaine (EMLA) cream Apply topically as needed.  30 g  0  . valACYclovir (VALTREX) 1000 MG tablet Take 1,000 mg by mouth daily.      . cephALEXin (KEFLEX) 500 MG capsule Take 1 capsule (500 mg total) by mouth 3 (three) times daily. X 10 days  30 capsule  0  . cholestyramine (QUESTRAN) 4 G packet Take 1 packet (4 g total) by mouth 2 (two) times daily as needed (diarrhea).  30 each  1  . clindamycin (CLINDAGEL) 1 % gel Apply topically 2 (two) times daily.  30 g  1  . LORazepam (ATIVAN) 0.5 MG tablet Take 1 tablet (0.5 mg total) by mouth every 6 (six) hours as needed (Nausea or vomiting).  30 tablet  0  . ondansetron (ZOFRAN) 8 MG tablet Take 1 tablet (8 mg total) by mouth 2 (two) times daily. Take two times a day starting the day after chemo for 3 days. Then take two times a day as needed for nausea or vomiting.  30 tablet  1  . prochlorperazine (COMPAZINE) 10 MG tablet Take 1 tablet (10 mg total) by mouth every 6 (six) hours as needed (Nausea or vomiting).  30 tablet  1   No current facility-administered  medications for this visit.   Facility-Administered Medications Ordered in Other Visits  Medication Dose Route Frequency Provider Last Rate Last Dose  . sodium chloride 0.9 % injection 10 mL  10 mL Intracatheter PRN Chauncey Cruel, MD   10 mL at 07/10/13 1627    OBJECTIVE: Middle-aged [place] American woman who appears weak and tired but is  in no acute distress Filed Vitals:   07/10/13 1116  BP: 115/74  Pulse: 74  Temp: 98 F (36.7 C)  Resp: 18     Body mass index is 29.98 kg/(m^2).    ECOG FS:2 Filed Weights   07/10/13 1116  Weight: 158 lb 9.6 oz (71.94 kg)   Physical Exam: HEENT:  Sclerae anicteric.  Oropharynx clear.  Buccal mucosa is pink and moist. No ulcerations or oropharyngeal  candidiasis.  Neck is supple.  NODES:  No cervical or supraclavicular lymphadenopathy palpated.  BREAST EXAM:    Right breast is unremarkable. Was unable to palpate a measurable mass in the left breast today. There no obvious skin changes noted.  Axillae are benign bilaterally, and I was unable to palpate any lymphadenopathy  on either the right or left today .  LUNGS:  Clear to auscultation bilaterally.  No wheezes or rhonchi.   HEART:  Regular rate and rhythm. Systolic murmur, unchanged  ABDOMEN:  Soft, nontender to palpation .  Positive bowel sounds.  MSK:  No focal spinal tenderness to palpation. Good range of motion in the upper extremities. EXTREMITIES:  No peripheral edema.   SKIN:   No skin changes noted other than mild hyperpigmentation in the palms of the hands bilaterally. D. 10 now on the left great toe is thickened and yellow, and the surrounding tissue is erythematous and tender to touch. There is no evidence of drainage or bleeding.    Port is intact with no erythema, edema, or evidence of  cellulitis/infection. NEURO:  Nonfocal. Well oriented.   positive affect.    LAB RESULTS:   Lab Results  Component Value Date   WBC 6.2 07/10/2013  NEUTROABS 3.5 07/10/2013   HGB 9.3*  07/10/2013   HCT 28.1* 07/10/2013   MCV 85.2 07/10/2013   PLT 134* 07/10/2013      Chemistry      Component Value Date/Time   NA 142 07/10/2013 1107   NA 139 06/06/2008 0545   K 3.5 07/10/2013 1107   K 3.8 06/06/2008 0545   CL 104 06/06/2008 0545   CO2 26 07/10/2013 1107   CO2 26 06/06/2008 0545   BUN 7.8 07/10/2013 1107   BUN 6 06/06/2008 0545   CREATININE 0.8 07/10/2013 1107   CREATININE 0.77 06/06/2008 0545      Component Value Date/Time   CALCIUM 9.1 07/10/2013 1107   CALCIUM 8.7 06/06/2008 0545   ALKPHOS 59 07/10/2013 1107   ALKPHOS 62 04/12/2008 1725   AST 25 07/10/2013 1107   AST 21 04/12/2008 1725   ALT 28 07/10/2013 1107   ALT 28 04/12/2008 1725   BILITOT 0.36 07/10/2013 1107   BILITOT 0.5 04/12/2008 1725       STUDIES:  DG Chest 2 View  07/03/2013   CLINICAL DATA: Breast cancer with new onset cough and shortness of  breath.  EXAM:  CHEST 2 VIEW  COMPARISON: CT chest 04/26/2013 and chest radiograph 04/21/2013.  FINDINGS:  Trachea is midline. Heart size normal. Right subclavian Port-A-Cath  tip is in the SVC. Lungs are clear. No pleural fluid.  IMPRESSION:  No acute findings.  Electronically Signed  By: Lorin Picket M.D.  On: 07/03/2013 11:01   Echocardiogram on 04/20/2013 showed an ejection fraction of 60-65%.  This is due to be repeated in late January, and it has been ordered but not yet scheduled as of 07/10/2013.   Mr Breast Bilateral W Wo Contrast  06/12/2013   CLINICAL DATA:  Personal history of left breast cancer. The patient is currently on chemotherapy. Assess for chemotherapy response.  EXAM: BILATERAL BREAST MRI WITH AND WITHOUT CONTRAST  LABS:  Does not apply  TECHNIQUE: Multiplanar, multisequence MR images of both breasts were obtained prior to and following the intravenous administration of 38m of MultiHance.  THREE-DIMENSIONAL MR IMAGE RENDERING ON INDEPENDENT WORKSTATION:  Three-dimensional MR images were rendered by post-processing of the original MR data on an  independent workstation. The three-dimensional MR images were interpreted, and findings are reported in the following complete MRI report for this study. Three dimensional images were evaluated at the independent DynaCad workstation  COMPARISON:  Previous exams  FINDINGS: Breast composition: b.  Scattered fibroglandular tissue  Background parenchymal enhancement: Mild  Right breast: No mass or abnormal enhancement.  Left breast: The previously noted 2 mass/biopsy proven cancer in the upper-outer quadrant left breast are significantly smaller compared to the previous exam. The largest mass is in the superior posterior upper-outer quadrant left breast currently measuring 1.6 cm diameter x 2 cm AP x 1.1 cm CC. The 2nd mass is inferior to the largest mass and currently measures 1.5 cm diameter x 0.8 cm AP x 0.9 cm cc. Both of these previously biopsied proving cancers demonstrate slow persistent kinetics. The previously biopsy proven fat necrosis in the superior anterior left breast is smaller currently measuring 0.8 cm diameter x 0.8 cm AP x 0.5 cm CC.  Lymph nodes: There is significant interval decrease in size of left axillary lymph nodes. The largest lymph node currently measures measures 1 cm.  Ancillary findings:  None.  IMPRESSION: Previously biopsy proven cancers in the upper-outer quadrant left breast are significantly smaller compared to the prior exam.  The previously noted enlarged lymph nodes in the left axilla are significantly smaller, largest measures 1 cm currently.  RECOMMENDATION: Treatment plan  BI-RADS CATEGORY  6: Known biopsy-proven malignancy - appropriate action should be taken.   Electronically Signed   By: Abelardo Diesel M.D.   On: 06/12/2013 12:38     ASSESSMENT: 53 y.o. [place], [place] woman   (1)  status post left breast biopsy 03/29/2013 for a clinical T2-T3 pN1, stage IIB-IIIA invasive ductal carcinoma, grade 2-3, estrogen receptor 39% positive (with moderate staining intensity),  progesterone receptor negative, with an MIB-1 of 83%, and HER-2 amplified by CISH, with a ratio of 7.5 and 15 HER-2 copies per cell  (2) receiving neoadjuvant chemotherapy, with trastuzumab/ pertuzumab/ carboplatin/docetaxel started 05/01/2013, to be repeated every 3 weeks x6. The patient will receive Neulasta on day 2 at [facility] per her request.     PLAN: Donnetta will proceed to treatment today as scheduled for her fourth cycle of docetaxel/carboplatin given with trastuzumab/pertuzumab.  She will receive her Neulasta injection at  [facility] tomorrow. She is scheduled to see her surgeon, Dr. Lucia Gaskins, later this week to discuss her upcoming surgery.  She'll return to see Dr. Jana Hakim next week for assessment of chemotoxicity on 07/17/2013.   With regard to her left toenail, I would like to wait until after she completes chemotherapy before any procedures are performed on the toenail if at all possible. I have suggested that she soaks the foot at least twice daily with either warm water and Epsom salts, or warm water and peroxide. I am starting her on a course of Keflex, 500 mg by mouth 3 times a day for 10 days to cover her for cellulitis. I would like to start her on fluconazole, however she is also on Celexa 40 mg daily and there is a chance of a possible interaction. Accordingly, I have also recommended that she try applying Vicks Vapor Rub underneath the nail to discourage fungal infection.  I have asked her to contact us, however, if the pain worsens, the toe becomes more erythematous, become swollen, or has evidence of drainage.  As noted above, I have ordered a repeat echocardiogram for later this month, but for some reason this has not been ordered yet through Dr. Clayborne Dana office. We will followup on this next week if it has still not been scheduled.  Laveta voices her understanding and agreement with this plan and will call with any changes or problems.   Micah Flesher, PA-C    07/10/2013 4:34 PM

## 2013-07-10 NOTE — Patient Instructions (Signed)
[place] Discharge Instructions for Patients Receiving Chemotherapy  Today you received the following chemotherapy agents HERCEPTIN, PERJETA, DOCETACEL, CARBOPLATIN  To help prevent nausea and vomiting after your treatment, we encourage you to take your nausea medication AS YOU HAVE BEEN DIRECTED BY DR Jana Hakim   If you develop nausea and vomiting that is not controlled by your nausea medication, call the clinic.   BELOW ARE SYMPTOMS THAT SHOULD BE REPORTED IMMEDIATELY:  *FEVER GREATER THAN 100.5 F  *CHILLS WITH OR WITHOUT FEVER  NAUSEA AND VOMITING THAT IS NOT CONTROLLED WITH YOUR NAUSEA MEDICATION  *UNUSUAL SHORTNESS OF BREATH  *UNUSUAL BRUISING OR BLEEDING  TENDERNESS IN MOUTH AND THROAT WITH OR WITHOUT PRESENCE OF ULCERS  *URINARY PROBLEMS  *BOWEL PROBLEMS  UNUSUAL RASH Items with * indicate a potential emergency and should be followed up as soon as possible.  Feel free to call the clinic you have any questions or concerns. The clinic phone number is [phone].

## 2013-07-10 NOTE — Telephone Encounter (Signed)
, °

## 2013-07-10 NOTE — Telephone Encounter (Signed)
Per staff phone call and POF I have schedueld appts.  JMW  

## 2013-07-11 ENCOUNTER — Encounter (HOSPITAL_COMMUNITY): Payer: BC Managed Care – PPO | Attending: Oncology

## 2013-07-11 VITALS — BP 110/63 | HR 64 | Temp 98.0°F | Resp 16

## 2013-07-11 DIAGNOSIS — C50419 Malignant neoplasm of upper-outer quadrant of unspecified female breast: Secondary | ICD-10-CM | POA: Insufficient documentation

## 2013-07-11 DIAGNOSIS — Z5189 Encounter for other specified aftercare: Secondary | ICD-10-CM

## 2013-07-11 DIAGNOSIS — C50412 Malignant neoplasm of upper-outer quadrant of left female breast: Secondary | ICD-10-CM

## 2013-07-11 MED ORDER — PEGFILGRASTIM INJECTION 6 MG/0.6ML
SUBCUTANEOUS | Status: AC
  Filled 2013-07-11: qty 0.6

## 2013-07-11 MED ORDER — PEGFILGRASTIM INJECTION 6 MG/0.6ML
6.0000 mg | Freq: Once | SUBCUTANEOUS | Status: AC
  Administered 2013-07-11: 6 mg via SUBCUTANEOUS

## 2013-07-11 NOTE — Progress Notes (Signed)
[name] [name] presents today for injection per the provider's orders.  Neulasta administered without incident; see MAR for injection details.  Patient tolerated procedure well and without incident.  No questions or complaints noted at this time.

## 2013-07-12 ENCOUNTER — Ambulatory Visit (INDEPENDENT_AMBULATORY_CARE_PROVIDER_SITE_OTHER): Payer: BC Managed Care – PPO | Admitting: Surgery

## 2013-07-12 ENCOUNTER — Encounter (INDEPENDENT_AMBULATORY_CARE_PROVIDER_SITE_OTHER): Payer: Self-pay | Admitting: Surgery

## 2013-07-12 ENCOUNTER — Ambulatory Visit (HOSPITAL_COMMUNITY): Payer: BC Managed Care – PPO

## 2013-07-12 VITALS — BP 126/62 | HR 63 | Temp 98.0°F | Resp 18 | Ht 61.0 in | Wt 161.0 lb

## 2013-07-12 DIAGNOSIS — C50419 Malignant neoplasm of upper-outer quadrant of unspecified female breast: Secondary | ICD-10-CM

## 2013-07-12 DIAGNOSIS — C50412 Malignant neoplasm of upper-outer quadrant of left female breast: Secondary | ICD-10-CM

## 2013-07-12 NOTE — Progress Notes (Signed)
Re:   [name] [name] DOB:   May 31, 1961 MRN:   [id number]  [facility]  ASSESSMENT AND PLAN: 1.  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 SLNBx depending on trial.  Receiving neoadjuvant chemotherapy, with trastuzumab/ pertuzumab/ carboplatin/docetaxel started 05/01/2013.  Tumor appears to be responding by MRI and PE.  She'll see me back in about 6 weeks, which be at the end of her chemotherapy.  We will then plan our next step.  1a.  Power port placed 04/21/2013 - D Homer Pfeifer  Wound looks good. 1b.  Biopsy of superior area of left breast showed fat necrosis - 04/18/2013  2.  Palpitations  Sees Dr. Elvera Lennox. Rothbart 3.  Osteoarthritis of both hips 4.  On Celebrex  To stop this one week before surgery 5.  To stop Premarin  6.  Anxiety 7.  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  Will address this after the end of her treatment for her breast cancer.  She has seen Dr. Michaell Cowing for this.  A CT scan 03/27/2013 showed no direct evidence of a rectovaginal fistula.  Dr. Michaell Cowing has suggested another test to try to "document" the fistula.  That is on hold during for now as she goes through the treatment for breast cancer.   8.  Trouble with left great toenail.  REFERRING PHYSICIAN: Harlow Asa, MD  HISTORY OF PRESENT ILLNESS: [name] [name] is a 53 y.o. (DOB: 01-28-1961)  AA  female whose primary care physician is LUKING,W S, MD and comes for follow up of left breast cancer. She is doing well.  She has tolerated the chemotx fairly well.  She has had some trouble with   MRI - 06/12/2013 - UOQ left breast cancer smaller.  Previously enlarged left axillary lymph node is significantly smaller.  (I've tried to  look at these on Epic, but the computer is slow/quirky and I cannot easily review the films).  History of breast cancer: She gets a mammogram every year.  She felt a lump in the upper outer part of her left breast.  She has no family history of breast cancer.  She takes a Premarin on a PRN basis.  It sounds like she takes no more that a few tabs a month.  She will stop this.  She had a hysterectomy for fibroids in 2010.  She had a mammogram at [facility] on 03/13/2013 which found a left breast mass.  On 03/29/2013 she had a left breast biopsy.  The biopsy (Accession: [id number]) showed IDC and metastatic carcinoma to a left axillary lymph node. MRI 04/11/2013 shows - 3 abnormal enhancing masses in the upper-outer quadrant of left breast encompassing a total area of the 6.4 x 2.4 x 4 cm as described. The largest, superior and posterior mass was previously biopsied proven cancer. The 2 other smaller masses seen are suspicious for neoplasm.  She is for biopsy of these two other areas this Friday, 10/9.   Past Medical History  Diagnosis Date  . Chest pain 2009    Consultation-Rothbart, negative chest CT; nl echo in 2005; h/o palpitations  . Palpitations   . Degenerative joint disease     + degenerative joint disease of the lumbosacral  spine  . Colitis 2010    not IBD  . Herpes simplex type II infection   . Allergic rhinitis   . Hypercholesteremia     "slightly high"  . Anxiety   . Heart murmur     "small"  . GERD (gastroesophageal reflux disease)     "a little"  . Breast cancer 03/31/13    left  . Cancer   . Depression   . Hypertension   . Anemia, unspecified 05/30/2013     Past Surgical History  Procedure Laterality Date  . Colonoscopy  10/2010    proctitis; melanosis coli  . Tubal ligation    . Breast excisional biopsy  04/2003    Left; benign disease  . Right oophorectomy  03/13/2007  . Abdominal hysterectomy  03/13/2007    TAH ?BSO--Dr Cheron Every, [place]  . Eye surgery Left      "fix lazy eye"  . Portacath placement Right 04/21/2013    Procedure: INSERTION PORT-A-CATH;  Surgeon: Kandis Cocking, MD;  Location: [facility];  Service: General;  Laterality: Right;      Current Outpatient Prescriptions  Medication Sig Dispense Refill  . ALPRAZolam (XANAX) 1 MG tablet 1 po TID prn anxiety  90 tablet  0  . atenolol (TENORMIN) 50 MG tablet TAKE 1 TABLET BY MOUTH EVERY DAY FOR PALPITATION  90 tablet  1  . cephALEXin (KEFLEX) 500 MG capsule Take 1 capsule (500 mg total) by mouth 3 (three) times daily. X 10 days  30 capsule  0  . cholestyramine (QUESTRAN) 4 G packet Take 1 packet (4 g total) by mouth 2 (two) times daily as needed (diarrhea).  30 each  1  . citalopram (CELEXA) 40 MG tablet Take 40 mg by mouth every evening.      . clindamycin (CLINDAGEL) 1 % gel Apply topically 2 (two) times daily.  30 g  1  . dexamethasone (DECADRON) 4 MG tablet Take 2 tablets (8 mg total) by mouth 2 (two) times daily with a meal. Take two times a day the day before Taxotere. Then take two times a day starting the day after chemo for 3 days.  30 tablet  1  . diphenhydramine-acetaminophen (TYLENOL PM) 25-500 MG TABS Take 2 tablets by mouth at bedtime.      . lidocaine-prilocaine (EMLA) cream Apply topically as needed.  30 g  0  . LORazepam (ATIVAN) 0.5 MG tablet Take 1 tablet (0.5 mg total) by mouth every 6 (six) hours as needed (Nausea or vomiting).  30 tablet  0  . ondansetron (ZOFRAN) 8 MG tablet Take 1 tablet (8 mg total) by mouth 2 (two) times daily. Take two times a day starting the day after chemo for 3 days. Then take two times a day as needed for nausea or vomiting.  30 tablet  1  . prochlorperazine (COMPAZINE) 10 MG tablet Take 1 tablet (10 mg total) by mouth every 6 (six) hours as needed (Nausea or vomiting).  30 tablet  1  . valACYclovir (VALTREX) 1000 MG tablet Take 1,000 mg by mouth daily.       No current facility-administered medications for this visit.      Allergies  Allergen  Reactions  . Doxycycline Other (See Comments)    Unknown    REVIEW OF SYSTEMS: Cardiac:  Palpitations.  Has seen Dr. Dietrich Pates. Gastrointestinal:  Has seen Dr. Michaell Cowing for possible colo-vesicle fistula, but CT scan was negative.  Had negative colonoscopy by Dr. Karilyn Cota in 11/03/2010.  For now, her fistula evaluation and treatment are on hold. Musculoskeletal:  Osteoarthritis of both hips, on Celebrex.  She cannot remember the name of the orthopedists. Hematologic:  No bleeding disorder.  On Celebrex. Psycho-social:  The patient is oriented.   History of anxiety.  SOCIAL and FAMILY HISTORY: Married. Husband, Christiane Ha, with her. She has 4 children.  3 daughters and 1 son. Ages 54, 69, 35, 62. Works as a Passenger transport manager at [employer]  PHYSICAL EXAM: BP 126/62  Pulse 63  Temp(Src) 98 F (36.7 C)  Resp 18  Ht 5\' 1"  (1.549 m)  Wt 161 lb (73.029 kg)  BMI 30.44 kg/m2  LMP 12/04/2008  General: WN AA F who is alert and generally healthy appearing.  NECK:  Supple.  No thyroid mass. LYMPH NODES:  No cervical, supraclavicular, or axillary adenopathy. BREASTS -  RIGHT:  No palpable mass or nodule.  No nipple discharge.  Port in upper inner right breast   LEFT:  I cannot feel a specific mass.  But she did not have a specific mass on initial exam. UPPER EXTREMITIES:  No evidence of lymphedema.  DATA REVIEWED: Epic notes.  Ovidio Kin, MD,  [facility] [address]  [address]   [place], [place]    [address] Phone:  [phone] FAX:  [phone]

## 2013-07-13 ENCOUNTER — Ambulatory Visit (INDEPENDENT_AMBULATORY_CARE_PROVIDER_SITE_OTHER): Payer: BC Managed Care – PPO | Admitting: Surgery

## 2013-07-17 ENCOUNTER — Telehealth: Payer: Self-pay | Admitting: *Deleted

## 2013-07-17 ENCOUNTER — Other Ambulatory Visit (HOSPITAL_BASED_OUTPATIENT_CLINIC_OR_DEPARTMENT_OTHER): Payer: BC Managed Care – PPO

## 2013-07-17 ENCOUNTER — Telehealth: Payer: Self-pay | Admitting: Oncology

## 2013-07-17 ENCOUNTER — Ambulatory Visit (HOSPITAL_BASED_OUTPATIENT_CLINIC_OR_DEPARTMENT_OTHER): Payer: BC Managed Care – PPO | Admitting: Oncology

## 2013-07-17 VITALS — BP 121/78 | HR 71 | Temp 98.1°F | Resp 18 | Ht 61.0 in | Wt 158.0 lb

## 2013-07-17 DIAGNOSIS — C50412 Malignant neoplasm of upper-outer quadrant of left female breast: Secondary | ICD-10-CM

## 2013-07-17 DIAGNOSIS — Z170 Estrogen receptor positive status [ER+]: Secondary | ICD-10-CM

## 2013-07-17 DIAGNOSIS — R6889 Other general symptoms and signs: Secondary | ICD-10-CM

## 2013-07-17 DIAGNOSIS — R5383 Other fatigue: Secondary | ICD-10-CM

## 2013-07-17 DIAGNOSIS — R5381 Other malaise: Secondary | ICD-10-CM

## 2013-07-17 DIAGNOSIS — C50419 Malignant neoplasm of upper-outer quadrant of unspecified female breast: Secondary | ICD-10-CM

## 2013-07-17 DIAGNOSIS — D649 Anemia, unspecified: Secondary | ICD-10-CM

## 2013-07-17 LAB — CBC WITH DIFFERENTIAL/PLATELET
BASO%: 0 % (ref 0.0–2.0)
Basophils Absolute: 0 10*3/uL (ref 0.0–0.1)
EOS%: 0.3 % (ref 0.0–7.0)
Eosinophils Absolute: 0 10*3/uL (ref 0.0–0.5)
HCT: 28.9 % — ABNORMAL LOW (ref 34.8–46.6)
HGB: 9.7 g/dL — ABNORMAL LOW (ref 11.6–15.9)
LYMPH%: 54.2 % — ABNORMAL HIGH (ref 14.0–49.7)
MCH: 28.4 pg (ref 25.1–34.0)
MCHC: 33.6 g/dL (ref 31.5–36.0)
MCV: 84.8 fL (ref 79.5–101.0)
MONO#: 0.9 10*3/uL (ref 0.1–0.9)
MONO%: 22.1 % — ABNORMAL HIGH (ref 0.0–14.0)
NEUT#: 0.9 10*3/uL — ABNORMAL LOW (ref 1.5–6.5)
NEUT%: 23.4 % — ABNORMAL LOW (ref 38.4–76.8)
Platelets: 114 10*3/uL — ABNORMAL LOW (ref 145–400)
RBC: 3.41 10*6/uL — ABNORMAL LOW (ref 3.70–5.45)
RDW: 17.7 % — ABNORMAL HIGH (ref 11.2–14.5)
WBC: 3.9 10*3/uL (ref 3.9–10.3)
lymph#: 2.1 10*3/uL (ref 0.9–3.3)

## 2013-07-17 MED ORDER — ALPRAZOLAM 1 MG PO TABS: ORAL_TABLET | ORAL | Status: DC

## 2013-07-17 NOTE — Telephone Encounter (Signed)
, °

## 2013-07-17 NOTE — Progress Notes (Signed)
ID: [name] OB: Feb 16, 1961  MR#: [id number]  UUV#:[id number]  PCP: Rubbie Battiest, MD ; Pearson Forster, NP GYN:   Josefa Half, MD  SU: Alphonsa Overall, MD [place]:  Thea Silversmith, MD OTHER MD: Freida Busman, MD;  Neysa Bonito, MD  CHIEF COMPLAINT:  Left Breast Cancer/Neoadjuvant Chemo  HISTORY OF PRESENT ILLNESS: [name] [name] palpated a mass in her left breast in late August 2014. She was already scheduled for screening mammography at [facility] and this was performed 03/13/2013. Indeed a possible mass was noted in the left breast and on 03/29/2013 the patient underwent diagnostic left mammography and left ultrasonography. This showed a 2.5 cm irregular mass in the upper outer quadrant of the left breast. This was palpable.by ultrasound there was a 1.6 cm irregular hypoechoic mass in the area in question, and in addition to enlarged left axillary lymph nodes were noted, the largest measuring 2.5 cm.  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  Bilateral breast MRI 04/11/2013 shows 3 abnormal enhancing masses in the upper outer quadrant of the left breast measuring altogether 6.4 cm. The largest separate mass measured 2.7 cm. There were abnormal or large lymph nodes in the left axilla. Ultrasound guided biopsy of the 2 smaller masses has been scheduled.  The patient's subsequent history is as detailed below   INTERVAL HISTORY: [name] [name] returns today for followup of her breast cancer. This is day 8 cycle 4 of her 6 planned cycles of carboplatin/ docetaxel/ trastuzumab/ pertuzumab  REVIEW OF SYSTEMS: [name] [name]' has had no peripheral neuropathy symptoms. She  does feel fatigued, but part of the problem is that her daughter with 3 grandchildren age 20, 81 and 4 have moved into her house and this is creating some stress. She does describe her fatigue as mild to moderate. She keeps a runny nose. She had problems with diarrhea and constipation with her first 3 cycles of chemotherapy but not with the fourth. Her appetite is poorand she has lost approximately 11 pounds since November. There have been no unusual headaches, visual changes, cough, phlegm production, pleurisy, or problems with bladder habits. She is moderately anxious. A detailed review of systems today was otherwise noncontributory   PAST MEDICAL HISTORY: Past Medical History  Diagnosis Date  . Chest pain 2009    Consultation-Rothbart, negative chest CT; nl echo in 2005; h/o palpitations  . Palpitations   . Degenerative joint disease     + degenerative joint disease of the lumbosacral spine  . Colitis 2010    not IBD  . Herpes simplex type II infection   . Allergic rhinitis   . Hypercholesteremia     "slightly high"  . Anxiety   . Heart murmur     "small"  . GERD (gastroesophageal reflux disease)     "a little"  . Breast cancer 03/31/13    left  . Cancer   . Depression   . Hypertension   . Anemia, unspecified 05/30/2013    PAST SURGICAL HISTORY: Past Surgical History  Procedure Laterality Date  . Colonoscopy  10/2010    proctitis; melanosis coli  . Tubal ligation    . Breast excisional  biopsy  04/2003    Left; benign disease  . Right oophorectomy  03/13/2007  . Abdominal hysterectomy  03/13/2007    TAH ?BSO--Dr Levin Bacon, [place]  . Eye surgery Left     "fix lazy eye"  . Portacath placement Right 04/21/2013    Procedure: INSERTION PORT-A-CATH;  Surgeon: [name], MD;  Location: [facility];  Service: General;  Laterality: Right;    FAMILY HISTORY Family History  Problem Relation Age of Onset  . Aneurysm Mother     Cerebral  . Hypertension Mother   . Hyperlipidemia  Mother   . Stroke Mother   . Coronary artery disease Father   . Hypertension Father   . Hyperlipidemia Father   . Lung cancer Brother   . Cancer Brother     Lung  The patient's father died at the age of 83 from a myocardial infarction. The patient's mother died at the age of 29 from a stroke. The patient had 6 brothers, 2 sisters. One brother has a history of lung cancer in the setting of tobacco abuse. Another brother had multiple myeloma. There is no history of breast or ovarian cancer in the family  GYNECOLOGIC HISTORY:  Menarche age 45, first live birth age 89, the patient is [name] [name] P4. She status post abdominal hysterectomy with bilateral salpingo-oophorectomy. She never took hormone replacement.   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 Her husband [name] works in Charity fundraiser. Daughter [name] lives at home with the patient. She is not employed. Son Kennady Zimmerle lives in [place] and is not employed. Daughter Maggie Schwalbe Dillard lives in [place] and is a Barrister's clerk. Daughter Ernst Spell is a Education officer, museum in [place]. The patient has 10 grandchildren. She attends the [employer]     ADVANCED DIRECTIVES: Not in place   HEALTH MAINTENANCE:  (Updated 05/30/2013)  History  Substance Use Topics  . Smoking status: Former Smoker -- 0.50 packs/day for 15 years    Types: Cigarettes    Quit date: 07/06/1982  . Smokeless tobacco: Never Used  . Alcohol Use: No     Colonoscopy:2012  PAP:2014   Bone density: Never  Lipid panel: Not on file    Allergies  Allergen Reactions  . Doxycycline Other (See Comments)    Unknown    Current Outpatient Prescriptions  Medication Sig Dispense Refill  . ALPRAZolam (XANAX) 1 MG tablet 1 po TID prn anxiety  90 tablet  0  . atenolol (TENORMIN) 50 MG tablet TAKE 1 TABLET BY MOUTH EVERY DAY FOR PALPITATION  90 tablet  1  . cephALEXin (KEFLEX) 500 MG capsule Take 1  capsule (500 mg total) by mouth 3 (three) times daily. X 10 days  30 capsule  0  . cholestyramine (QUESTRAN) 4 G packet Take 1 packet (4 g total) by mouth 2 (two) times daily as needed (diarrhea).  30 each  1  . citalopram (CELEXA) 40 MG tablet Take 40 mg by mouth every evening.      . clindamycin (CLINDAGEL) 1 % gel Apply topically 2 (two) times daily.  30 g  1  . dexamethasone (DECADRON) 4 MG tablet Take 2 tablets (8 mg total) by mouth 2 (two) times daily with a meal. Take two times a day the day before Taxotere. Then take two times a day starting the day after chemo for 3 days.  30 tablet  1  . diphenhydramine-acetaminophen (TYLENOL  PM) 25-500 MG TABS Take 2 tablets by mouth at bedtime.      . lidocaine-prilocaine (EMLA) cream Apply topically as needed.  30 g  0  . LORazepam (ATIVAN) 0.5 MG tablet Take 1 tablet (0.5 mg total) by mouth every 6 (six) hours as needed (Nausea or vomiting).  30 tablet  0  . ondansetron (ZOFRAN) 8 MG tablet Take 1 tablet (8 mg total) by mouth 2 (two) times daily. Take two times a day starting the day after chemo for 3 days. Then take two times a day as needed for nausea or vomiting.  30 tablet  1  . prochlorperazine (COMPAZINE) 10 MG tablet Take 1 tablet (10 mg total) by mouth every 6 (six) hours as needed (Nausea or vomiting).  30 tablet  1  . valACYclovir (VALTREX) 1000 MG tablet Take 1,000 mg by mouth daily.       No current facility-administered medications for this visit.    OBJECTIVE: Middle-aged [place] American woman in no acute distress Filed Vitals:   07/17/13 1353  BP: 121/78  Pulse: 71  Temp: 98.1 F (36.7 C)  Resp: 18     Body mass index is 29.87 kg/(m^2).    ECOG FS:1 Filed Weights   07/17/13 1353  Weight: 158 lb (71.668 kg)   Sclerae unicteric, pupils equal and round Oropharynx clear and moist-- no thrush No cervical or supraclavicular adenopathy Lungs no rales or rhonchi Heart regular rate and rhythm Abd soft, nontender, positive bowel  sounds MSK no focal spinal tenderness, no upper extremity lymphedema Neuro: nonfocal, well oriented, appropriate affect Breasts: I do not palpate a left breast mass and there are no skin or nipple changes of concern. The right axilla is benign. The left breast is unremarkable NEURO:  Nonfocal. Well oriented.   Positive affect. Skin:some darkening of the skin particularly involving the fingers. Port is intact   LAB RESULTS:   Lab Results  Component Value Date   WBC 3.9 07/17/2013   NEUTROABS 0.9* 07/17/2013   HGB 9.7* 07/17/2013   HCT 28.9* 07/17/2013   MCV 84.8 07/17/2013   PLT 114* 07/17/2013      Chemistry      Component Value Date/Time   NA 142 07/10/2013 1107   NA 139 06/06/2008 0545   K 3.5 07/10/2013 1107   K 3.8 06/06/2008 0545   CL 104 06/06/2008 0545   CO2 26 07/10/2013 1107   CO2 26 06/06/2008 0545   BUN 7.8 07/10/2013 1107   BUN 6 06/06/2008 0545   CREATININE 0.8 07/10/2013 1107   CREATININE 0.77 06/06/2008 0545      Component Value Date/Time   CALCIUM 9.1 07/10/2013 1107   CALCIUM 8.7 06/06/2008 0545   ALKPHOS 59 07/10/2013 1107   ALKPHOS 62 04/12/2008 1725   AST 25 07/10/2013 1107   AST 21 04/12/2008 1725   ALT 28 07/10/2013 1107   ALT 28 04/12/2008 1725   BILITOT 0.36 07/10/2013 1107   BILITOT 0.5 04/12/2008 1725       STUDIES:  DG Chest 2 View  07/03/2013   CLINICAL DATA: Breast cancer with new onset cough and shortness of  breath.  EXAM:  CHEST 2 VIEW  COMPARISON: CT chest 04/26/2013 and chest radiograph 04/21/2013.  FINDINGS:  Trachea is midline. Heart size normal. Right subclavian Port-A-Cath  tip is in the SVC. Lungs are clear. No pleural fluid.  IMPRESSION:  No acute findings.  Electronically Signed  By: Lorin Picket M.D.  On: 07/03/2013 11:01  Echocardiogram on 04/20/2013 showed an ejection fraction of 60-65%.  This is due to be repeated in late January, and it has been ordered but not yet scheduled as of 07/10/2013.   Mr Breast Bilateral W Wo  Contrast  06/12/2013   CLINICAL DATA:  Personal history of left breast cancer. The patient is currently on chemotherapy. Assess for chemotherapy response.  EXAM: BILATERAL BREAST MRI WITH AND WITHOUT CONTRAST  LABS:  Does not apply  TECHNIQUE: Multiplanar, multisequence MR images of both breasts were obtained prior to and following the intravenous administration of 21ml of MultiHance.  THREE-DIMENSIONAL MR IMAGE RENDERING ON INDEPENDENT WORKSTATION:  Three-dimensional MR images were rendered by post-processing of the original MR data on an independent workstation. The three-dimensional MR images were interpreted, and findings are reported in the following complete MRI report for this study. Three dimensional images were evaluated at the independent DynaCad workstation  COMPARISON:  Previous exams  FINDINGS: Breast composition: b.  Scattered fibroglandular tissue  Background parenchymal enhancement: Mild  Right breast: No mass or abnormal enhancement.  Left breast: The previously noted 2 mass/biopsy proven cancer in the upper-outer quadrant left breast are significantly smaller compared to the previous exam. The largest mass is in the superior posterior upper-outer quadrant left breast currently measuring 1.6 cm diameter x 2 cm AP x 1.1 cm CC. The 2nd mass is inferior to the largest mass and currently measures 1.5 cm diameter x 0.8 cm AP x 0.9 cm cc. Both of these previously biopsied proving cancers demonstrate slow persistent kinetics. The previously biopsy proven fat necrosis in the superior anterior left breast is smaller currently measuring 0.8 cm diameter x 0.8 cm AP x 0.5 cm CC.  Lymph nodes: There is significant interval decrease in size of left axillary lymph nodes. The largest lymph node currently measures measures 1 cm.  Ancillary findings:  None.  IMPRESSION: Previously biopsy proven cancers in the upper-outer quadrant left breast are significantly smaller compared to the prior exam. The previously noted  enlarged lymph nodes in the left axilla are significantly smaller, largest measures 1 cm currently.  RECOMMENDATION: Treatment plan  BI-RADS CATEGORY  6: Known biopsy-proven malignancy - appropriate action should be taken.   Electronically Signed   By: Abelardo Diesel M.D.   On: 06/12/2013 12:38     ASSESSMENT: 53 y.o. [place], [place] woman   (1)  status post left breast biopsy 03/29/2013 for a clinical T2-T3 pN1, stage IIB-IIIA invasive ductal carcinoma, grade 2-3, estrogen receptor 39% positive (with moderate staining intensity), progesterone receptor negative, with an MIB-1 of 83%, and HER-2 amplified by CISH, with a ratio of 7.5 and 15 HER-2 copies per cell  (2) receiving neoadjuvant chemotherapy, with trastuzumab/ pertuzumab/ carboplatin/docetaxel started 05/01/2013, to be repeated every 3 weeks x6. The patient will receive Neulasta on day 2 at [facility] per her request.     PLAN: [name] [name] is tolerating treatment well overall. She does feel tired, and she is moderately anemic, accounting for that. We do have an echocardiogram scheduled before her next treatment, which will be January 26. She will then see [place] early February and then have her final chemotherapy dose on February 16.  She is already scheduled to see her surgeonDr. Varney Biles 25. On February 23 she will have a second breast MRI so that can make a definitive decision regarding surgery at the 08/30/2013 visit  I don't have a simple solution for the anemia. Certainly if the hemoglobindrops further we can consider a transfusion. Destinee  has a good understanding of the overall plan and agrees with it. She knows to call for any problems that may develop before next visit here.    Chauncey Cruel, MD   07/17/2013 1:57 PM

## 2013-07-17 NOTE — Telephone Encounter (Signed)
Per staff phone call I have adjusted 2/16 appt

## 2013-07-24 ENCOUNTER — Ambulatory Visit: Payer: BC Managed Care – PPO

## 2013-07-24 ENCOUNTER — Other Ambulatory Visit: Payer: BC Managed Care – PPO

## 2013-07-24 ENCOUNTER — Ambulatory Visit (HOSPITAL_BASED_OUTPATIENT_CLINIC_OR_DEPARTMENT_OTHER): Payer: BC Managed Care – PPO | Admitting: Hematology and Oncology

## 2013-07-24 ENCOUNTER — Other Ambulatory Visit (HOSPITAL_BASED_OUTPATIENT_CLINIC_OR_DEPARTMENT_OTHER): Payer: BC Managed Care – PPO

## 2013-07-24 ENCOUNTER — Telehealth: Payer: Self-pay | Admitting: *Deleted

## 2013-07-24 ENCOUNTER — Other Ambulatory Visit: Payer: Self-pay | Admitting: *Deleted

## 2013-07-24 VITALS — BP 124/73 | HR 81 | Temp 98.7°F | Resp 18 | Ht 61.0 in | Wt 156.2 lb

## 2013-07-24 DIAGNOSIS — C50419 Malignant neoplasm of upper-outer quadrant of unspecified female breast: Secondary | ICD-10-CM

## 2013-07-24 DIAGNOSIS — E860 Dehydration: Secondary | ICD-10-CM

## 2013-07-24 DIAGNOSIS — C50412 Malignant neoplasm of upper-outer quadrant of left female breast: Secondary | ICD-10-CM

## 2013-07-24 DIAGNOSIS — Z170 Estrogen receptor positive status [ER+]: Secondary | ICD-10-CM

## 2013-07-24 DIAGNOSIS — D649 Anemia, unspecified: Secondary | ICD-10-CM

## 2013-07-24 DIAGNOSIS — R197 Diarrhea, unspecified: Secondary | ICD-10-CM

## 2013-07-24 DIAGNOSIS — R21 Rash and other nonspecific skin eruption: Secondary | ICD-10-CM

## 2013-07-24 LAB — CBC WITH DIFFERENTIAL/PLATELET
BASO%: 0.2 % (ref 0.0–2.0)
Basophils Absolute: 0 10*3/uL (ref 0.0–0.1)
EOS%: 0 % (ref 0.0–7.0)
Eosinophils Absolute: 0 10*3/uL (ref 0.0–0.5)
HCT: 28.9 % — ABNORMAL LOW (ref 34.8–46.6)
HGB: 9.8 g/dL — ABNORMAL LOW (ref 11.6–15.9)
LYMPH%: 15.9 % (ref 14.0–49.7)
MCH: 29.2 pg (ref 25.1–34.0)
MCHC: 33.9 g/dL (ref 31.5–36.0)
MCV: 86 fL (ref 79.5–101.0)
MONO#: 1.1 10*3/uL — ABNORMAL HIGH (ref 0.1–0.9)
MONO%: 8.6 % (ref 0.0–14.0)
NEUT#: 9.9 10*3/uL — ABNORMAL HIGH (ref 1.5–6.5)
NEUT%: 75.3 % (ref 38.4–76.8)
Platelets: 76 10*3/uL — ABNORMAL LOW (ref 145–400)
RBC: 3.36 10*6/uL — ABNORMAL LOW (ref 3.70–5.45)
RDW: 17.7 % — ABNORMAL HIGH (ref 11.2–14.5)
WBC: 13.1 10*3/uL — ABNORMAL HIGH (ref 3.9–10.3)
lymph#: 2.1 10*3/uL (ref 0.9–3.3)
nRBC: 0 % (ref 0–0)

## 2013-07-24 MED ORDER — SODIUM CHLORIDE 0.9 % IV SOLN: INTRAVENOUS | Status: DC

## 2013-07-24 MED ORDER — SODIUM CHLORIDE 0.9 % IV SOLN: INTRAVENOUS | Status: AC

## 2013-07-24 NOTE — Telephone Encounter (Signed)
Pt called to this RN stating onset of diarrhea over the weekend not relieved with medication recommended.  Per inquiry Chrisa states diarrhea as " watery and too numerous to count ".  Per discussion this RN discussed need for stool sample for evaluation if diarrhea related to compromised immune system and overgrowth of normal bacteria in colon.  Pt will come in now and provide sample- She will then wait to speak to this RN.

## 2013-07-24 NOTE — Progress Notes (Signed)
ID: [name] [name] OB: 05/28/1961  MR#: [id number]  CSN#:[id number]  PCP: Rubbie Battiest, MD ; Pearson Forster, NP GYN:   Josefa Half, MD  SU: Alphonsa Overall, MD [place]:  Thea Silversmith, MD OTHER MD: Freida Busman, MD;  Neysa Bonito, MD  CHIEF COMPLAINT:  Skin rash and diarrhea  HISTORY OF PRESENT ILLNESS: [name] [name] palpated a mass in her left breast in late August 2014. She was already scheduled for screening mammography at [facility] and this was performed 03/13/2013. Indeed a possible mass was noted in the left breast and on 03/29/2013 the patient underwent diagnostic left mammography and left ultrasonography. This showed a 2.5 cm irregular mass in the upper outer quadrant of the left breast. This was palpable.by ultrasound there was a 1.6 cm irregular hypoechoic mass in the area in question, and in addition to enlarged left axillary lymph nodes were noted, the largest measuring 2.5 cm.  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  Bilateral breast MRI 04/11/2013 shows 3 abnormal enhancing masses in the upper outer quadrant of the left breast measuring altogether 6.4 cm. The largest separate mass measured 2.7 cm. There were abnormal or large lymph nodes in the left axilla. Ultrasound guided biopsy of the 2 smaller masses has been scheduled.  The patient's subsequent history is as detailed below   INTERVAL HISTORY: [name] is here today with the complaints of skin rash and also diarrhea. Skin rash started first followed by diarrhea. She is getting 6-8 episodes of loose stool per day for the past 2 days not associated with any mucous or blood. She also  complains of itching and burning sensation in the skin rash. She says that it's more pronounced in both both upper extremities, face, chest and back. She is not having any fever, no chills, no shortness of breath no chest pains. She is not able to eat good or drink fluids because of her diarrhea [name] [name] returns today for followup of her breast cancer. This is day 8 cycle 4 of her 6 planned cycles of carboplatin/ docetaxel/ trastuzumab/ pertuzumab  REVIEW OF SYSTEMS: All 14 review of systems have been assessed and pertinent symptoms are listed in interval history  PAST MEDICAL HISTORY: Past Medical History  Diagnosis Date  . Chest pain 2009    Consultation-Rothbart, negative chest CT; nl echo in 2005; h/o palpitations  . Palpitations   . Degenerative joint disease     + degenerative joint disease of the lumbosacral spine  . Colitis 2010    not IBD  . Herpes simplex type II infection   . Allergic rhinitis   . Hypercholesteremia     "slightly high"  . Anxiety   . Heart murmur     "small"  . GERD (gastroesophageal reflux disease)     "a little"  . Breast cancer 03/31/13    left  . Cancer   . Depression   . Hypertension   . Anemia, unspecified 05/30/2013    PAST SURGICAL HISTORY: Past Surgical History  Procedure Laterality Date  . Colonoscopy  10/2010    proctitis; melanosis coli  . Tubal ligation    . Breast excisional  biopsy  04/2003    Left; benign disease  . Right oophorectomy  03/13/2007  . Abdominal hysterectomy  03/13/2007    TAH ?BSO--Dr Levin Bacon, [place]  . Eye surgery Left     "fix lazy eye"  . Portacath placement Right 04/21/2013    Procedure: INSERTION PORT-A-CATH;  Surgeon: [name], MD;  Location: [facility];  Service: General;  Laterality: Right;    FAMILY HISTORY Family History  Problem Relation Age of Onset  . Aneurysm Mother     Cerebral  . Hypertension Mother   . Hyperlipidemia Mother   . Stroke Mother   . Coronary artery disease Father   .  Hypertension Father   . Hyperlipidemia Father   . Lung cancer Brother   . Cancer Brother     Lung  The patient's father died at the age of 67 from a myocardial infarction. The patient's mother died at the age of 84 from a stroke. The patient had 6 brothers, 2 sisters. One brother has a history of lung cancer in the setting of tobacco abuse. Another brother had multiple myeloma. There is no history of breast or ovarian cancer in the family  GYNECOLOGIC HISTORY:  Menarche age 75, first live birth age 84, the patient is [place] P4. She status post abdominal hysterectomy with bilateral salpingo-oophorectomy. She never took hormone replacement.   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 Her husband [name] works in Charity fundraiser. Daughter [name] [name] lives at home with the patient. She is not employed. Son [name] [name] lives in [place] and is not employed. Daughter [name] [name] lives in [place] and is a Barrister's clerk. Daughter [name] [name] is a Education officer, museum in [place]. The patient has 10 grandchildren. She attends [employer]     ADVANCED DIRECTIVES: Not in place   HEALTH MAINTENANCE:  (Updated 05/30/2013)  History  Substance Use Topics  . Smoking status: Former Smoker -- 0.50 packs/day for 15 years    Types: Cigarettes    Quit date: 07/06/1982  . Smokeless tobacco: Never Used  . Alcohol Use: No     Colonoscopy:2012  PAP:2014   Bone density: Never  Lipid panel: Not on file    Allergies  Allergen Reactions  . Doxycycline Other (See Comments)    Unknown    Current Outpatient Prescriptions  Medication Sig Dispense Refill  . ALPRAZolam (XANAX) 1 MG tablet 1 po TID prn anxiety  90 tablet  0  . atenolol (TENORMIN) 50 MG tablet TAKE 1 TABLET BY MOUTH EVERY DAY FOR PALPITATION  90 tablet  1  . cholestyramine (QUESTRAN) 4 G packet Take 1 packet (4 g total) by mouth 2 (two) times daily as needed  (diarrhea).  30 each  1  . citalopram (CELEXA) 40 MG tablet Take 40 mg by mouth every evening.      . diphenhydramine-acetaminophen (TYLENOL PM) 25-500 MG TABS Take 2 tablets by mouth at bedtime.      . lidocaine-prilocaine (EMLA) cream Apply topically as needed.  30 g  0  . LORazepam (ATIVAN) 0.5 MG tablet Take 1 tablet (0.5 mg total) by mouth every 6 (six) hours as needed (Nausea or vomiting).  30 tablet  0  . ondansetron (ZOFRAN) 8 MG tablet Take 1 tablet (8 mg total) by mouth 2 (two) times daily. Take two times a day starting the day after chemo for 3 days. Then take two times a  day as needed for nausea or vomiting.  30 tablet  1  . valACYclovir (VALTREX) 1000 MG tablet Take 1,000 mg by mouth daily.      . clindamycin (CLINDAGEL) 1 % gel Apply topically 2 (two) times daily.  30 g  1  . dexamethasone (DECADRON) 4 MG tablet Take 2 tablets (8 mg total) by mouth 2 (two) times daily with a meal. Take two times a day the day before Taxotere. Then take two times a day starting the day after chemo for 3 days.  30 tablet  1  . prochlorperazine (COMPAZINE) 10 MG tablet Take 1 tablet (10 mg total) by mouth every 6 (six) hours as needed (Nausea or vomiting).  30 tablet  1   Current Facility-Administered Medications  Medication Dose Route Frequency Provider Last Rate Last Dose  . 0.9 %  sodium chloride infusion   Intravenous Continuous Wilmon Arms, MD        OBJECTIVE: Middle-aged African American woman in no acute distress Filed Vitals:   07/24/13 1225  BP: 124/73  Pulse: 81  Temp: 98.7 F (37.1 C)  Resp: 18     Body mass index is 29.53 kg/(m^2).    ECOG FS:1 Filed Weights   07/24/13 1225  Weight: 156 lb 3.2 oz (70.852 kg)   Sclerae unicteric, pupils equal and round Oropharynx clear and moist-- no thrush No cervical or supraclavicular adenopathy Lungs no rales or rhonchi Heart regular rate and rhythm Abd soft, nontender, positive bowel sounds MSK no focal spinal tenderness, no upper  extremity lymphedema Neuro: nonfocal, well oriented, appropriate affect Breasts: I do not palpate a left breast mass and there are no skin or nipple changes of concern. The right axilla is benign. The left breast is unremarkable NEURO:  Nonfocal. Well oriented.   Positive affect. Skin: Skin rash noted in both upper extremities and the chest back and face these are maculopapular lesions. Port is intact   LAB RESULTS:   Lab Results  Component Value Date   WBC 13.1* 07/24/2013   NEUTROABS 9.9* 07/24/2013   HGB 9.8* 07/24/2013   HCT 28.9* 07/24/2013   MCV 86.0 07/24/2013   PLT 76* 07/24/2013      Chemistry      Component Value Date/Time   NA 142 07/10/2013 1107   NA 139 06/06/2008 0545   K 3.5 07/10/2013 1107   K 3.8 06/06/2008 0545   CL 104 06/06/2008 0545   CO2 26 07/10/2013 1107   CO2 26 06/06/2008 0545   BUN 7.8 07/10/2013 1107   BUN 6 06/06/2008 0545   CREATININE 0.8 07/10/2013 1107   CREATININE 0.77 06/06/2008 0545      Component Value Date/Time   CALCIUM 9.1 07/10/2013 1107   CALCIUM 8.7 06/06/2008 0545   ALKPHOS 59 07/10/2013 1107   ALKPHOS 62 04/12/2008 1725   AST 25 07/10/2013 1107   AST 21 04/12/2008 1725   ALT 28 07/10/2013 1107   ALT 28 04/12/2008 1725   BILITOT 0.36 07/10/2013 1107   BILITOT 0.5 04/12/2008 1725       STUDIES:  DG Chest 2 View  07/03/2013   CLINICAL DATA: Breast cancer with new onset cough and shortness of  breath.  EXAM:  CHEST 2 VIEW  COMPARISON: CT chest 04/26/2013 and chest radiograph 04/21/2013.  FINDINGS:  Trachea is midline. Heart size normal. Right subclavian Port-A-Cath  tip is in the SVC. Lungs are clear. No pleural fluid.  IMPRESSION:  No acute findings.  Electronically Signed  By: Lorin Picket M.D.  On: 07/03/2013 11:01   Echocardiogram on 04/20/2013 showed an ejection fraction of 60-65%.  This is due to be repeated in late January, and it has been ordered but not yet scheduled as of 07/10/2013.   Mr Breast Bilateral W Wo  Contrast  06/12/2013   CLINICAL DATA:  Personal history of left breast cancer. The patient is currently on chemotherapy. Assess for chemotherapy response.  EXAM: BILATERAL BREAST MRI WITH AND WITHOUT CONTRAST  LABS:  Does not apply  TECHNIQUE: Multiplanar, multisequence MR images of both breasts were obtained prior to and following the intravenous administration of 21m of MultiHance.  THREE-DIMENSIONAL MR IMAGE RENDERING ON INDEPENDENT WORKSTATION:  Three-dimensional MR images were rendered by post-processing of the original MR data on an independent workstation. The three-dimensional MR images were interpreted, and findings are reported in the following complete MRI report for this study. Three dimensional images were evaluated at the independent DynaCad workstation  COMPARISON:  Previous exams  FINDINGS: Breast composition: b.  Scattered fibroglandular tissue  Background parenchymal enhancement: Mild  Right breast: No mass or abnormal enhancement.  Left breast: The previously noted 2 mass/biopsy proven cancer in the upper-outer quadrant left breast are significantly smaller compared to the previous exam. The largest mass is in the superior posterior upper-outer quadrant left breast currently measuring 1.6 cm diameter x 2 cm AP x 1.1 cm CC. The 2nd mass is inferior to the largest mass and currently measures 1.5 cm diameter x 0.8 cm AP x 0.9 cm cc. Both of these previously biopsied proving cancers demonstrate slow persistent kinetics. The previously biopsy proven fat necrosis in the superior anterior left breast is smaller currently measuring 0.8 cm diameter x 0.8 cm AP x 0.5 cm CC.  Lymph nodes: There is significant interval decrease in size of left axillary lymph nodes. The largest lymph node currently measures measures 1 cm.  Ancillary findings:  None.  IMPRESSION: Previously biopsy proven cancers in the upper-outer quadrant left breast are significantly smaller compared to the prior exam. The previously noted  enlarged lymph nodes in the left axilla are significantly smaller, largest measures 1 cm currently.  RECOMMENDATION: Treatment plan  BI-RADS CATEGORY  6: Known biopsy-proven malignancy - appropriate action should be taken.   Electronically Signed   By: WAbelardo DieselM.D.   On: 06/12/2013 12:38     ASSESSMENT/PLAN: 53[place]   (1)  status post left breast biopsy 03/29/2013 for a clinical T2-T3 pN1, stage IIB-IIIA invasive ductal carcinoma, grade 2-3, estrogen receptor 39% positive (with moderate staining intensity), progesterone receptor negative, with an MIB-1 of 83%, and HER-2 amplified by CISH, with a ratio of 7.5 and 15 HER-2 copies per cell  (2) receiving neoadjuvant chemotherapy, with trastuzumab/ pertuzumab/ carboplatin/docetaxel started 05/01/2013, to be repeated every 3 weeks x6. [facility]chocardiogram scheduled before her next treatment, which will be January 26.  She is  scheduled to see her surgeonDr. NVarney Biles25. On February 23 she will have a second breast MRI so that can make a definitive decision regarding surgery at the 08/30/2013 visit   (3) Diarrhea final etiology to be determined: Will order the stool workup including c-diff, ova, parasites and stool cultures  (4) skin rash most likely chemotherapy related/secondary to doubt viral etiologies: Since there is no relief with Benadryl , I have asked the patient to take dexamethasone 4 mg by mouth twice daily  for 2 days for intense itching and burning of the skin, and instructed to apply Aquaphor cream twice daily to the affected area  (5) Dehydration secondary to #3: We'll arrange for 1 L of IV fluids with normal saline. We'll also perform the serum electrolytes  (6) Anemia most likely secondary to chemotherapy-induced versus rule out coexisting iron deficiency: Will order the iron panel and ferritin  Follow up visit in 1 week   She  knows to call for any problems that may develop before next visit here.    Wilmon Arms, MD   07/24/2013 1:41 PM

## 2013-07-25 ENCOUNTER — Ambulatory Visit (HOSPITAL_COMMUNITY): Payer: BC Managed Care – PPO

## 2013-07-25 ENCOUNTER — Other Ambulatory Visit: Payer: Self-pay | Admitting: Oncology

## 2013-07-25 ENCOUNTER — Other Ambulatory Visit: Payer: Self-pay | Admitting: Hematology and Oncology

## 2013-07-25 DIAGNOSIS — R197 Diarrhea, unspecified: Secondary | ICD-10-CM

## 2013-07-25 DIAGNOSIS — E860 Dehydration: Secondary | ICD-10-CM

## 2013-07-25 DIAGNOSIS — C50412 Malignant neoplasm of upper-outer quadrant of left female breast: Secondary | ICD-10-CM

## 2013-07-25 LAB — CLOSTRIDIUM DIFFICILE BY PCR: Toxigenic C. Difficile by PCR: NEGATIVE

## 2013-07-25 MED ORDER — ALPRAZOLAM 1 MG PO TABS: ORAL_TABLET | ORAL | Status: DC

## 2013-07-26 ENCOUNTER — Ambulatory Visit (HOSPITAL_BASED_OUTPATIENT_CLINIC_OR_DEPARTMENT_OTHER)
Admission: RE | Admit: 2013-07-26 | Discharge: 2013-07-26 | Disposition: A | Discharge status: Home / Self Care | Payer: BC Managed Care – PPO | Source: Ambulatory Visit | Attending: Cardiology | Admitting: Cardiology

## 2013-07-26 ENCOUNTER — Ambulatory Visit (HOSPITAL_COMMUNITY)
Admission: RE | Admit: 2013-07-26 | Discharge: 2013-07-26 | Disposition: A | Discharge status: Home / Self Care | Payer: BC Managed Care – PPO | Source: Ambulatory Visit | Attending: Internal Medicine | Admitting: Internal Medicine

## 2013-07-26 ENCOUNTER — Encounter (HOSPITAL_COMMUNITY): Payer: Self-pay

## 2013-07-26 VITALS — BP 108/66 | HR 75 | Ht 60.0 in | Wt 159.4 lb

## 2013-07-26 DIAGNOSIS — I519 Heart disease, unspecified: Secondary | ICD-10-CM | POA: Insufficient documentation

## 2013-07-26 DIAGNOSIS — C50419 Malignant neoplasm of upper-outer quadrant of unspecified female breast: Secondary | ICD-10-CM

## 2013-07-26 DIAGNOSIS — R002 Palpitations: Secondary | ICD-10-CM

## 2013-07-26 DIAGNOSIS — C50412 Malignant neoplasm of upper-outer quadrant of left female breast: Secondary | ICD-10-CM

## 2013-07-26 DIAGNOSIS — F430 Acute stress reaction: Secondary | ICD-10-CM

## 2013-07-26 DIAGNOSIS — F411 Generalized anxiety disorder: Secondary | ICD-10-CM

## 2013-07-26 DIAGNOSIS — R079 Chest pain, unspecified: Secondary | ICD-10-CM

## 2013-07-26 LAB — STOOL CULTURE

## 2013-07-26 NOTE — Patient Instructions (Signed)
Follow up in 3 months with an ECHO 

## 2013-07-26 NOTE — Progress Notes (Signed)
Patient ID: [name] [name], female   DOB: [birth year], 53 y.o.   MRN: [id number] Referring MD: Dr [name] [name] PCP: [name] Battiest, MD ; [name] Forster, NP  GYN: [name] Half, MD  SU: [name] Overall, MD  [place]: [name] Silversmith, MD Cardiologist: Dr [name] [name].   HPI: [name] [name] is a 53 year old with history of L breast cancer diagnosed 03/2013 T2-T3 pN1, stage IIB-IIIA invasive ductal carcinoma, grade 2-3, estrogen receptor 39% positive (with moderate staining intensity), progesterone receptor negative, with an MIB-1 of 83%, and HER-2 amplified by CISH, HTN, Depression, herpes simplex II, DJD, palpitations --on atenolol, referred to the cardio-onc clinic by Dr [name] [name] due to Herceptin. She has no history of coronary disease. .    Plan to receive trastuzumab/ pertuzumab/ carboplatin/docetaxel. This was started 05/01/13 to be repeated every 3 weeks x 6.   Complains rash noted on her trunk. Oncology aware. Mild dyspnea with exertion. Complains of fatigue. No palpitations. Does not exercise.   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 Lives with her husband and daughter. Former smoker quit 15 years ago  FH: Mother: cerebral aneurysm, HTN, Hyperlipidemia, CVA,       Father:  Deceased MI at age 11 and died. CAD, HTN, Hyper lipidemia,        Brother: Lung Cancer   Review of Systems:     Cardiac Review of Systems: {Y] = yes '[ ]'  = no  Chest Pain [    ]  Resting SOB [   ] Exertional SOB  [  ]  Orthopnea [  ]   Pedal Edema [   ]    Palpitations [  ] Syncope  [  ]   Presyncope [   ]  General Review of Systems: [Y] = yes [  ]=no Constitional: recent weight change [  ]; anorexia [  ]; fatigue [Y  ]; nausea [  ]; night sweats [  ]; fever [  ]; or chills [  ];                                                                                                                                          Dental: poor dentition[  ]; Last Dentist  visit:   Eye : blurred vision [  ]; diplopia [   ]; vision changes [  ];  Amaurosis fugax[  ]; Resp: cough [  ];  wheezing[  ];  hemoptysis[  ]; shortness of breath[  ]; paroxysmal nocturnal dyspnea[  ]; dyspnea on exertion[  ]; or orthopnea[  ];  GI:  gallstones[  ], vomiting[  ];  dysphagia[  ]; melena[  ];  hematochezia [  ]; heartburn[  ];   Hx of  Colonoscopy[  ]; GU: kidney stones [  ]; hematuria[  ];  dysuria [  ];  nocturia[  ];  history of     obstruction [  ];                 Skin: rash , swelling[Y  ];, hair loss[ Y ];  peripheral edema[  ];  or itching[  ]; Musculosketetal: myalgias[  ];  joint swelling[  ];  joint erythema[  ];  joint pain[  ];  back pain[  ];  Heme/Lymph: bruising[  ];  bleeding[  ];  anemia[  ];  Neuro: TIA[  ];  headaches[  ];  stroke[  ];  vertigo[  ];  seizures[  ];   paresthesias[  ];  difficulty walking[  ];  Psych:depression[  ]; anxiety[  ];  Endocrine: diabetes[  ];  thyroid dysfunction[  ];  Immunizations: Flu [  ]; Pneumococcal[  ];  Other:    Past Medical History  Diagnosis Date  . Chest pain 2009    Consultation-Rothbart, negative chest CT; nl echo in 2005; h/o palpitations  . Palpitations   . Degenerative joint disease     + degenerative joint disease of the lumbosacral spine  . Colitis 2010    not IBD  . Herpes simplex type II infection   . Allergic rhinitis   . Hypercholesteremia     "slightly high"  . Anxiety   . Heart murmur     "small"  . GERD (gastroesophageal reflux disease)     "a little"  . Breast cancer 03/31/13    left  . Cancer   . Depression   . Hypertension   . Anemia, unspecified 05/30/2013    Current Outpatient Prescriptions  Medication Sig Dispense Refill  . ALPRAZolam (XANAX) 1 MG tablet 1 po TID prn anxiety  90 tablet  0  . atenolol (TENORMIN) 50 MG tablet TAKE 1 TABLET BY MOUTH EVERY DAY FOR PALPITATION  90 tablet  1  . cholestyramine (QUESTRAN) 4 G packet Take 1 packet (4 g total) by mouth 2 (two) times  daily as needed (diarrhea).  30 each  1  . citalopram (CELEXA) 40 MG tablet Take 40 mg by mouth every evening.      . clindamycin (CLINDAGEL) 1 % gel Apply topically 2 (two) times daily.  30 g  1  . dexamethasone (DECADRON) 4 MG tablet Take 2 tablets (8 mg total) by mouth 2 (two) times daily with a meal. Take two times a day the day before Taxotere. Then take two times a day starting the day after chemo for 3 days.  30 tablet  1  . diphenhydramine-acetaminophen (TYLENOL PM) 25-500 MG TABS Take 2 tablets by mouth at bedtime.      . lidocaine-prilocaine (EMLA) cream Apply topically as needed.  30 g  0  . LORazepam (ATIVAN) 0.5 MG tablet Take 1 tablet (0.5 mg total) by mouth every 6 (six) hours as needed (Nausea or vomiting).  30 tablet  0  . ondansetron (ZOFRAN) 8 MG tablet Take 1 tablet (8 mg total) by mouth 2 (two) times daily. Take two times a day starting the day after chemo for 3 days. Then take two times a day as needed for nausea or vomiting.  30 tablet  1  . prochlorperazine (COMPAZINE) 10 MG tablet Take 1 tablet (10 mg total) by mouth every 6 (six) hours as needed (Nausea or vomiting).  30 tablet  1  . valACYclovir (VALTREX) 1000 MG tablet Take 1,000 mg by mouth daily.  No current facility-administered medications for this encounter.     Allergies  Allergen Reactions  . Doxycycline Other (See Comments)    Unknown    History   Social History  . Marital Status: Married    Spouse Name: N/A    Number of Children: 4  . Years of Education: N/A   Occupational History  . Factory work [employer]   Social History Main Topics  . Smoking status: Former Smoker -- 0.50 packs/day for 15 years    Types: Cigarettes    Quit date: 07/06/1982  . Smokeless tobacco: Never Used  . Alcohol Use: No  . Drug Use: No  . Sexual Activity: Yes    Partners: Male    Birth Control/ Protection: Surgical     Comment: TAH   Other Topics Concern  . Not on file   Social History Narrative   . No narrative on file    Family History  Problem Relation Age of Onset  . Aneurysm Mother     Cerebral  . Hypertension Mother   . Hyperlipidemia Mother   . Stroke Mother   . Coronary artery disease Father   . Hypertension Father   . Hyperlipidemia Father   . Lung cancer Brother   . Cancer Brother     Lung    PHYSICAL EXAM: Filed Vitals:   07/26/13 1005  BP: 108/66  Pulse: 75   General:  Well appearing. No respiratory difficulty HEENT: normal Neck: supple. no JVD. Carotids 2+ bilat; no bruits. No lymphadenopathy or thryomegaly appreciated. Cor: PMI nondisplaced. Regular rate & rhythm. No rubs, gallops or murmurs. Lungs: clear Abdomen: soft, nontender, nondistended. No hepatosplenomegaly. No bruits or masses. Good bowel sounds. Extremities: no cyanosis, clubbing, rash, edema Neuro: alert & oriented x 3, cranial nerves grossly intact. moves all 4 extremities w/o difficulty. Affect pleasant. Skin : Rash noted on trunk      Results for orders placed in visit on 07/25/13 (from the past 24 hour(s))  CLOSTRIDIUM DIFFICILE BY PCR     Status: None   Collection Time    07/25/13 11:53 AM      Result Value Range   C difficile by pcr Negative  Negative   Narrative:    Performed at:  [facility]               [place], [place] [address]   No results found.   ASSESSMENT & PLAN:  1. L Breast Cancer- receiving trastuzumab/ pertuzumab/ carboplatin/docetaxel started 05/01/2013 followed by Herceptin every 3 weeks for 1 year. Explained the purpose of the cardio-oncology clinic. Plan to follow every 3 months with an ECHO while she is on Herceptin. I reviewed echo today and discussed with patient.  Good EF, strain parameters, and tissue dopplers parameters.   2. Palpitations- on atenolol  Loralie Champagne 07/26/2013

## 2013-07-27 ENCOUNTER — Other Ambulatory Visit: Payer: Self-pay | Admitting: Oncology

## 2013-07-28 ENCOUNTER — Other Ambulatory Visit: Payer: Self-pay | Admitting: Physician Assistant

## 2013-07-28 ENCOUNTER — Other Ambulatory Visit: Payer: Self-pay | Admitting: *Deleted

## 2013-07-31 ENCOUNTER — Ambulatory Visit (HOSPITAL_BASED_OUTPATIENT_CLINIC_OR_DEPARTMENT_OTHER): Payer: BC Managed Care – PPO | Admitting: Physician Assistant

## 2013-07-31 ENCOUNTER — Ambulatory Visit (HOSPITAL_COMMUNITY)
Admission: RE | Admit: 2013-07-31 | Discharge: 2013-07-31 | Disposition: A | Discharge status: Home / Self Care | Payer: BC Managed Care – PPO | Source: Ambulatory Visit | Attending: Oncology | Admitting: Oncology

## 2013-07-31 ENCOUNTER — Other Ambulatory Visit (HOSPITAL_BASED_OUTPATIENT_CLINIC_OR_DEPARTMENT_OTHER): Payer: BC Managed Care – PPO

## 2013-07-31 ENCOUNTER — Ambulatory Visit (HOSPITAL_BASED_OUTPATIENT_CLINIC_OR_DEPARTMENT_OTHER): Payer: BC Managed Care – PPO

## 2013-07-31 ENCOUNTER — Encounter: Payer: Self-pay | Admitting: Physician Assistant

## 2013-07-31 ENCOUNTER — Other Ambulatory Visit: Payer: Self-pay | Admitting: Hematology and Oncology

## 2013-07-31 VITALS — BP 124/72 | HR 68 | Temp 97.1°F | Resp 16

## 2013-07-31 VITALS — BP 104/66 | HR 90 | Temp 98.3°F | Resp 18 | Ht 60.0 in | Wt 162.1 lb

## 2013-07-31 DIAGNOSIS — D649 Anemia, unspecified: Secondary | ICD-10-CM

## 2013-07-31 DIAGNOSIS — Z5111 Encounter for antineoplastic chemotherapy: Secondary | ICD-10-CM

## 2013-07-31 DIAGNOSIS — R5383 Other fatigue: Secondary | ICD-10-CM

## 2013-07-31 DIAGNOSIS — Z170 Estrogen receptor positive status [ER+]: Secondary | ICD-10-CM

## 2013-07-31 DIAGNOSIS — C50412 Malignant neoplasm of upper-outer quadrant of left female breast: Secondary | ICD-10-CM

## 2013-07-31 DIAGNOSIS — R5381 Other malaise: Secondary | ICD-10-CM

## 2013-07-31 DIAGNOSIS — R51 Headache: Secondary | ICD-10-CM

## 2013-07-31 DIAGNOSIS — Z5112 Encounter for antineoplastic immunotherapy: Secondary | ICD-10-CM

## 2013-07-31 DIAGNOSIS — E860 Dehydration: Secondary | ICD-10-CM

## 2013-07-31 DIAGNOSIS — B370 Candidal stomatitis: Secondary | ICD-10-CM

## 2013-07-31 DIAGNOSIS — R197 Diarrhea, unspecified: Secondary | ICD-10-CM

## 2013-07-31 DIAGNOSIS — C50419 Malignant neoplasm of upper-outer quadrant of unspecified female breast: Secondary | ICD-10-CM

## 2013-07-31 LAB — COMPREHENSIVE METABOLIC PANEL (CC13)
ALT: 27 U/L (ref 0–55)
AST: 17 U/L (ref 5–34)
Albumin: 3.4 g/dL — ABNORMAL LOW (ref 3.5–5.0)
Alkaline Phosphatase: 57 U/L (ref 40–150)
Anion Gap: 8 mEq/L (ref 3–11)
BUN: 8.9 mg/dL (ref 7.0–26.0)
CO2: 28 mEq/L (ref 22–29)
Calcium: 9 mg/dL (ref 8.4–10.4)
Chloride: 107 mEq/L (ref 98–109)
Creatinine: 0.7 mg/dL (ref 0.6–1.1)
Glucose: 117 mg/dl (ref 70–140)
Potassium: 3.1 mEq/L — ABNORMAL LOW (ref 3.5–5.1)
Sodium: 144 mEq/L (ref 136–145)
Total Bilirubin: 0.24 mg/dL (ref 0.20–1.20)
Total Protein: 6 g/dL — ABNORMAL LOW (ref 6.4–8.3)

## 2013-07-31 LAB — CBC WITH DIFFERENTIAL/PLATELET
BASO%: 0.1 % (ref 0.0–2.0)
Basophils Absolute: 0 10*3/uL (ref 0.0–0.1)
EOS%: 0 % (ref 0.0–7.0)
Eosinophils Absolute: 0 10*3/uL (ref 0.0–0.5)
HCT: 25 % — ABNORMAL LOW (ref 34.8–46.6)
HGB: 8.2 g/dL — ABNORMAL LOW (ref 11.6–15.9)
LYMPH%: 20.9 % (ref 14.0–49.7)
MCH: 29.4 pg (ref 25.1–34.0)
MCHC: 32.8 g/dL (ref 31.5–36.0)
MCV: 89.6 fL (ref 79.5–101.0)
MONO#: 0.8 10*3/uL (ref 0.1–0.9)
MONO%: 9.2 % (ref 0.0–14.0)
NEUT#: 6.1 10*3/uL (ref 1.5–6.5)
NEUT%: 69.8 % (ref 38.4–76.8)
Platelets: 193 10*3/uL (ref 145–400)
RBC: 2.79 10*6/uL — ABNORMAL LOW (ref 3.70–5.45)
RDW: 19.8 % — ABNORMAL HIGH (ref 11.2–14.5)
WBC: 8.7 10*3/uL (ref 3.9–10.3)
lymph#: 1.8 10*3/uL (ref 0.9–3.3)
nRBC: 0 % (ref 0–0)

## 2013-07-31 LAB — ABO/RH: ABO/RH(D): O POS

## 2013-07-31 LAB — IRON AND TIBC CHCC
%SAT: 22 % (ref 21–57)
Iron: 58 ug/dL (ref 41–142)
TIBC: 269 ug/dL (ref 236–444)
UIBC: 210 ug/dL (ref 120–384)

## 2013-07-31 LAB — FERRITIN CHCC: Ferritin: 241 ng/ml (ref 9–269)

## 2013-07-31 LAB — PREPARE RBC (CROSSMATCH)

## 2013-07-31 LAB — MAGNESIUM (CC13): Magnesium: 1.4 mg/dl — CL (ref 1.5–2.5)

## 2013-07-31 MED ORDER — ACETAMINOPHEN 325 MG PO TABS
ORAL_TABLET | ORAL | Status: AC
  Filled 2013-07-31: qty 2

## 2013-07-31 MED ORDER — ACETAMINOPHEN 325 MG PO TABS
650.0000 mg | ORAL_TABLET | Freq: Once | ORAL | Status: AC
  Administered 2013-07-31: 650 mg via ORAL

## 2013-07-31 MED ORDER — DIPHENHYDRAMINE HCL 25 MG PO CAPS: 25.0000 mg | ORAL_CAPSULE | Freq: Once | ORAL | Status: DC

## 2013-07-31 MED ORDER — DIPHENHYDRAMINE HCL 25 MG PO CAPS
25.0000 mg | ORAL_CAPSULE | Freq: Once | ORAL | Status: AC
  Administered 2013-07-31: 25 mg via ORAL

## 2013-07-31 MED ORDER — SODIUM CHLORIDE 0.9 % IV SOLN
732.6000 mg | Freq: Once | INTRAVENOUS | Status: AC
  Administered 2013-07-31: 730 mg via INTRAVENOUS
  Filled 2013-07-31: qty 73

## 2013-07-31 MED ORDER — DEXAMETHASONE SODIUM PHOSPHATE 20 MG/5ML IJ SOLN
20.0000 mg | Freq: Once | INTRAMUSCULAR | Status: AC
  Administered 2013-07-31: 20 mg via INTRAVENOUS

## 2013-07-31 MED ORDER — SODIUM CHLORIDE 0.9 % IV SOLN
250.0000 mL | Freq: Once | INTRAVENOUS | Status: AC
  Administered 2013-07-31: 250 mL via INTRAVENOUS

## 2013-07-31 MED ORDER — ONDANSETRON 16 MG/50ML IVPB (CHCC)
INTRAVENOUS | Status: AC
  Filled 2013-07-31: qty 16

## 2013-07-31 MED ORDER — ONDANSETRON 16 MG/50ML IVPB (CHCC)
16.0000 mg | Freq: Once | INTRAVENOUS | Status: AC
  Administered 2013-07-31: 16 mg via INTRAVENOUS

## 2013-07-31 MED ORDER — ACETAMINOPHEN 325 MG PO TABS: 650.0000 mg | ORAL_TABLET | Freq: Once | ORAL | Status: DC

## 2013-07-31 MED ORDER — SODIUM CHLORIDE 0.9 % IV SOLN
75.0000 mg/m2 | Freq: Once | INTRAVENOUS | Status: AC
  Administered 2013-07-31: 130 mg via INTRAVENOUS
  Filled 2013-07-31: qty 13

## 2013-07-31 MED ORDER — SODIUM CHLORIDE 0.9 % IV SOLN
Freq: Once | INTRAVENOUS | Status: AC
  Administered 2013-07-31: 10:00:00 via INTRAVENOUS

## 2013-07-31 MED ORDER — DIPHENHYDRAMINE HCL 25 MG PO CAPS
ORAL_CAPSULE | ORAL | Status: AC
  Filled 2013-07-31: qty 1

## 2013-07-31 MED ORDER — MAGNESIUM OXIDE 400 (241.3 MG) MG PO TABS: 400.0000 mg | ORAL_TABLET | Freq: Every day | ORAL | Status: DC

## 2013-07-31 MED ORDER — DEXAMETHASONE SODIUM PHOSPHATE 20 MG/5ML IJ SOLN
INTRAMUSCULAR | Status: AC
  Filled 2013-07-31: qty 5

## 2013-07-31 MED ORDER — TRASTUZUMAB CHEMO INJECTION 440 MG
6.0000 mg/kg | Freq: Once | INTRAVENOUS | Status: AC
  Administered 2013-07-31: 462 mg via INTRAVENOUS
  Filled 2013-07-31: qty 22

## 2013-07-31 MED ORDER — MAGIC MOUTHWASH: 5.0000 mL | Freq: Four times a day (QID) | ORAL | Status: DC

## 2013-07-31 MED ORDER — POTASSIUM CHLORIDE CRYS ER 20 MEQ PO TBCR: 20.0000 meq | EXTENDED_RELEASE_TABLET | Freq: Two times a day (BID) | ORAL | Status: DC

## 2013-07-31 NOTE — Patient Instructions (Addendum)
[place] Discharge Instructions for Patients Receiving Chemotherapy  Today you received the following chemotherapy agents:  Taxotere, Carboplatin and Herceptin  To help prevent nausea and vomiting after your treatment, we encourage you to take your nausea medication as ordered per MD.   If you develop nausea and vomiting that is not controlled by your nausea medication, call the clinic.   BELOW ARE SYMPTOMS THAT SHOULD BE REPORTED IMMEDIATELY:  *FEVER GREATER THAN 100.5 F  *CHILLS WITH OR WITHOUT FEVER  NAUSEA AND VOMITING THAT IS NOT CONTROLLED WITH YOUR NAUSEA MEDICATION  *UNUSUAL SHORTNESS OF BREATH  *UNUSUAL BRUISING OR BLEEDING  TENDERNESS IN MOUTH AND THROAT WITH OR WITHOUT PRESENCE OF ULCERS  *URINARY PROBLEMS  *BOWEL PROBLEMS  UNUSUAL RASH Items with * indicate a potential emergency and should be followed up as soon as possible.  Feel free to call the clinic you have any questions or concerns. The clinic phone number is [phone].   Blood Transfusion  A blood transfusion replaces your blood or some of its parts. Blood is replaced when you have lost blood because of surgery, an accident, or for severe blood conditions like anemia. You can donate blood to be used on yourself if you have a planned surgery. If you lose blood during that surgery, your own blood can be given back to you. Any blood given to you is checked to make sure it matches your blood type. Your temperature, blood pressure, and heart rate (vital signs) will be checked often.  GET HELP RIGHT AWAY IF:   You feel sick to your stomach (nauseous) or throw up (vomit).  You have watery poop (diarrhea).  You have shortness of breath or trouble breathing.  You have blood in your pee (urine) or have dark colored pee.  You have chest pain or tightness.  Your eyes or skin turn yellow (jaundice).  You have a temperature by mouth above 102 F (38.9 C), not controlled by  medicine.  You start to shake and have chills.  You develop a a red rash (hives) or feel itchy.  You develop lightheadedness or feel confused.  You develop back, joint, or muscle pain.  You do not feel hungry (lost appetite).  You feel tired, restless, or nervous.  You develop belly (abdominal) cramps. Document Released: 09/18/2008 Document Revised: 09/14/2011 Document Reviewed: 09/18/2008 [facility] Patient Information 2014 [place], [place].

## 2013-07-31 NOTE — Progress Notes (Signed)
ID: [name] [name] OB: 04-11-1961  MR#: [id number]  XYI#:[id number]  PCP: Rubbie Battiest, MD ; Pearson Forster, NP GYN:   Josefa Half, MD  SU: Alphonsa Overall, MD [place]:  Thea Silversmith, MD OTHER MD: Freida Busman, MD;  Neysa Bonito, MD  CHIEF COMPLAINT:  Left Breast Cancer/Neoadjuvant Chemo  HISTORY OF PRESENT ILLNESS: [name] [name] palpated a mass in her left breast in late August 2014. She was already scheduled for screening mammography at [facility] and this was performed 03/13/2013. Indeed a possible mass was noted in the left breast and on 03/29/2013 the patient underwent diagnostic left mammography and left ultrasonography. This showed a 2.5 cm irregular mass in the upper outer quadrant of the left breast. This was palpable.by ultrasound there was a 1.6 cm irregular hypoechoic mass in the area in question, and in addition to enlarged left axillary lymph nodes were noted, the largest measuring 2.5 cm.  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  Bilateral breast MRI 04/11/2013 shows 3 abnormal enhancing masses in the upper outer quadrant of the left breast measuring altogether 6.4 cm. The largest separate mass measured 2.7 cm. There were abnormal or large lymph nodes in the left axilla. Ultrasound guided biopsy of the 2 smaller masses has been scheduled.  The patient's subsequent history is as detailed below   INTERVAL HISTORY: [name] [name] returns today accompanied by her husband for followup of her locally advanced left breast cancer.  She is due for day 1 cycle 5 of 6 planned q. three-week doses of carboplatin/ docetaxel/ trastuzumab/ pertuzumab given in the  neoadjuvant setting. She receives Neulasta injections on day 2, given at [facility].  [name] [name] is feeling better today than when she was evaluated one week ago. Fatigue is still her biggest complaint. She received supportive IV fluids last week. Her diarrhea eventually improved, and both C. difficile and stool cultures were negative on 07/25/2013. She is eating and drinking better. She still has some occasional loose stools, but they have definitely improved. She also had a rash last week which has resolved, although she continues to have some mild itching. Both the rash and diarrhea seemed to begin somewhere around days 6-10 following chemotherapy.  REVIEW OF SYSTEMS: Aadvika denies any fevers or chills. She's had no signs of abnormal bleeding. Her energy level is still very low. She does have some shortness of breath with exertion, but denies any orthopnea, cough, phlegm production, peripheral swelling, chest pain, or palpitations. She has a runny nose, but no epistasis. She denies any significant nausea or emesis. She's had no change in urinary habits. She continues to have headaches, almost on a daily basis, for which she is taking Tylenol. She also admits to an increased amount of stress in her life. Her daughter just moved back in with her, along with her 3 children all under the age of 31. Rip Harbour denies any change in vision. She currently denies any unusual myalgias, arthralgias, bony pain, peripheral swelling, or increased peripheral neuropathy.  A detailed review of systems is otherwise stable and noncontributory.   PAST MEDICAL HISTORY: Past Medical History  Diagnosis Date  . Chest pain 2009  ID: [name] [name] OB: 04-11-1961  MR#: [id number]  XYI#:[id number]  PCP: Rubbie Battiest, MD ; Pearson Forster, NP GYN:   Josefa Half, MD  SU: Alphonsa Overall, MD [place]:  Thea Silversmith, MD OTHER MD: Freida Busman, MD;  Neysa Bonito, MD  CHIEF COMPLAINT:  Left Breast Cancer/Neoadjuvant Chemo  HISTORY OF PRESENT ILLNESS: [name] [name] palpated a mass in her left breast in late August 2014. She was already scheduled for screening mammography at [facility] and this was performed 03/13/2013. Indeed a possible mass was noted in the left breast and on 03/29/2013 the patient underwent diagnostic left mammography and left ultrasonography. This showed a 2.5 cm irregular mass in the upper outer quadrant of the left breast. This was palpable.by ultrasound there was a 1.6 cm irregular hypoechoic mass in the area in question, and in addition to enlarged left axillary lymph nodes were noted, the largest measuring 2.5 cm.  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  Bilateral breast MRI 04/11/2013 shows 3 abnormal enhancing masses in the upper outer quadrant of the left breast measuring altogether 6.4 cm. The largest separate mass measured 2.7 cm. There were abnormal or large lymph nodes in the left axilla. Ultrasound guided biopsy of the 2 smaller masses has been scheduled.  The patient's subsequent history is as detailed below   INTERVAL HISTORY: [name] [name] returns today accompanied by her husband for followup of her locally advanced left breast cancer.  She is due for day 1 cycle 5 of 6 planned q. three-week doses of carboplatin/ docetaxel/ trastuzumab/ pertuzumab given in the  neoadjuvant setting. She receives Neulasta injections on day 2, given at [facility].  [name] [name] is feeling better today than when she was evaluated one week ago. Fatigue is still her biggest complaint. She received supportive IV fluids last week. Her diarrhea eventually improved, and both C. difficile and stool cultures were negative on 07/25/2013. She is eating and drinking better. She still has some occasional loose stools, but they have definitely improved. She also had a rash last week which has resolved, although she continues to have some mild itching. Both the rash and diarrhea seemed to begin somewhere around days 6-10 following chemotherapy.  REVIEW OF SYSTEMS: Aadvika denies any fevers or chills. She's had no signs of abnormal bleeding. Her energy level is still very low. She does have some shortness of breath with exertion, but denies any orthopnea, cough, phlegm production, peripheral swelling, chest pain, or palpitations. She has a runny nose, but no epistasis. She denies any significant nausea or emesis. She's had no change in urinary habits. She continues to have headaches, almost on a daily basis, for which she is taking Tylenol. She also admits to an increased amount of stress in her life. Her daughter just moved back in with her, along with her 3 children all under the age of 31. Rip Harbour denies any change in vision. She currently denies any unusual myalgias, arthralgias, bony pain, peripheral swelling, or increased peripheral neuropathy.  A detailed review of systems is otherwise stable and noncontributory.   PAST MEDICAL HISTORY: Past Medical History  Diagnosis Date  . Chest pain 2009  ID: [name] [name] OB: 04-11-1961  MR#: [id number]  XYI#:[id number]  PCP: Rubbie Battiest, MD ; Pearson Forster, NP GYN:   Josefa Half, MD  SU: Alphonsa Overall, MD [place]:  Thea Silversmith, MD OTHER MD: Freida Busman, MD;  Neysa Bonito, MD  CHIEF COMPLAINT:  Left Breast Cancer/Neoadjuvant Chemo  HISTORY OF PRESENT ILLNESS: [name] [name] palpated a mass in her left breast in late August 2014. She was already scheduled for screening mammography at [facility] and this was performed 03/13/2013. Indeed a possible mass was noted in the left breast and on 03/29/2013 the patient underwent diagnostic left mammography and left ultrasonography. This showed a 2.5 cm irregular mass in the upper outer quadrant of the left breast. This was palpable.by ultrasound there was a 1.6 cm irregular hypoechoic mass in the area in question, and in addition to enlarged left axillary lymph nodes were noted, the largest measuring 2.5 cm.  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  Bilateral breast MRI 04/11/2013 shows 3 abnormal enhancing masses in the upper outer quadrant of the left breast measuring altogether 6.4 cm. The largest separate mass measured 2.7 cm. There were abnormal or large lymph nodes in the left axilla. Ultrasound guided biopsy of the 2 smaller masses has been scheduled.  The patient's subsequent history is as detailed below   INTERVAL HISTORY: [name] [name] returns today accompanied by her husband for followup of her locally advanced left breast cancer.  She is due for day 1 cycle 5 of 6 planned q. three-week doses of carboplatin/ docetaxel/ trastuzumab/ pertuzumab given in the  neoadjuvant setting. She receives Neulasta injections on day 2, given at [facility].  [name] [name] is feeling better today than when she was evaluated one week ago. Fatigue is still her biggest complaint. She received supportive IV fluids last week. Her diarrhea eventually improved, and both C. difficile and stool cultures were negative on 07/25/2013. She is eating and drinking better. She still has some occasional loose stools, but they have definitely improved. She also had a rash last week which has resolved, although she continues to have some mild itching. Both the rash and diarrhea seemed to begin somewhere around days 6-10 following chemotherapy.  REVIEW OF SYSTEMS: Aadvika denies any fevers or chills. She's had no signs of abnormal bleeding. Her energy level is still very low. She does have some shortness of breath with exertion, but denies any orthopnea, cough, phlegm production, peripheral swelling, chest pain, or palpitations. She has a runny nose, but no epistasis. She denies any significant nausea or emesis. She's had no change in urinary habits. She continues to have headaches, almost on a daily basis, for which she is taking Tylenol. She also admits to an increased amount of stress in her life. Her daughter just moved back in with her, along with her 3 children all under the age of 31. Rip Harbour denies any change in vision. She currently denies any unusual myalgias, arthralgias, bony pain, peripheral swelling, or increased peripheral neuropathy.  A detailed review of systems is otherwise stable and noncontributory.   PAST MEDICAL HISTORY: Past Medical History  Diagnosis Date  . Chest pain 2009  ID: [name] [name] OB: 04-11-1961  MR#: [id number]  XYI#:[id number]  PCP: Rubbie Battiest, MD ; Pearson Forster, NP GYN:   Josefa Half, MD  SU: Alphonsa Overall, MD [place]:  Thea Silversmith, MD OTHER MD: Freida Busman, MD;  Neysa Bonito, MD  CHIEF COMPLAINT:  Left Breast Cancer/Neoadjuvant Chemo  HISTORY OF PRESENT ILLNESS: [name] [name] palpated a mass in her left breast in late August 2014. She was already scheduled for screening mammography at [facility] and this was performed 03/13/2013. Indeed a possible mass was noted in the left breast and on 03/29/2013 the patient underwent diagnostic left mammography and left ultrasonography. This showed a 2.5 cm irregular mass in the upper outer quadrant of the left breast. This was palpable.by ultrasound there was a 1.6 cm irregular hypoechoic mass in the area in question, and in addition to enlarged left axillary lymph nodes were noted, the largest measuring 2.5 cm.  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  Bilateral breast MRI 04/11/2013 shows 3 abnormal enhancing masses in the upper outer quadrant of the left breast measuring altogether 6.4 cm. The largest separate mass measured 2.7 cm. There were abnormal or large lymph nodes in the left axilla. Ultrasound guided biopsy of the 2 smaller masses has been scheduled.  The patient's subsequent history is as detailed below   INTERVAL HISTORY: [name] [name] returns today accompanied by her husband for followup of her locally advanced left breast cancer.  She is due for day 1 cycle 5 of 6 planned q. three-week doses of carboplatin/ docetaxel/ trastuzumab/ pertuzumab given in the  neoadjuvant setting. She receives Neulasta injections on day 2, given at [facility].  [name] [name] is feeling better today than when she was evaluated one week ago. Fatigue is still her biggest complaint. She received supportive IV fluids last week. Her diarrhea eventually improved, and both C. difficile and stool cultures were negative on 07/25/2013. She is eating and drinking better. She still has some occasional loose stools, but they have definitely improved. She also had a rash last week which has resolved, although she continues to have some mild itching. Both the rash and diarrhea seemed to begin somewhere around days 6-10 following chemotherapy.  REVIEW OF SYSTEMS: Aadvika denies any fevers or chills. She's had no signs of abnormal bleeding. Her energy level is still very low. She does have some shortness of breath with exertion, but denies any orthopnea, cough, phlegm production, peripheral swelling, chest pain, or palpitations. She has a runny nose, but no epistasis. She denies any significant nausea or emesis. She's had no change in urinary habits. She continues to have headaches, almost on a daily basis, for which she is taking Tylenol. She also admits to an increased amount of stress in her life. Her daughter just moved back in with her, along with her 3 children all under the age of 31. Rip Harbour denies any change in vision. She currently denies any unusual myalgias, arthralgias, bony pain, peripheral swelling, or increased peripheral neuropathy.  A detailed review of systems is otherwise stable and noncontributory.   PAST MEDICAL HISTORY: Past Medical History  Diagnosis Date  . Chest pain 2009  ID: [name] [name] OB: 04-11-1961  MR#: [id number]  XYI#:[id number]  PCP: Rubbie Battiest, MD ; Pearson Forster, NP GYN:   Josefa Half, MD  SU: Alphonsa Overall, MD [place]:  Thea Silversmith, MD OTHER MD: Freida Busman, MD;  Neysa Bonito, MD  CHIEF COMPLAINT:  Left Breast Cancer/Neoadjuvant Chemo  HISTORY OF PRESENT ILLNESS: [name] [name] palpated a mass in her left breast in late August 2014. She was already scheduled for screening mammography at [facility] and this was performed 03/13/2013. Indeed a possible mass was noted in the left breast and on 03/29/2013 the patient underwent diagnostic left mammography and left ultrasonography. This showed a 2.5 cm irregular mass in the upper outer quadrant of the left breast. This was palpable.by ultrasound there was a 1.6 cm irregular hypoechoic mass in the area in question, and in addition to enlarged left axillary lymph nodes were noted, the largest measuring 2.5 cm.  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  Bilateral breast MRI 04/11/2013 shows 3 abnormal enhancing masses in the upper outer quadrant of the left breast measuring altogether 6.4 cm. The largest separate mass measured 2.7 cm. There were abnormal or large lymph nodes in the left axilla. Ultrasound guided biopsy of the 2 smaller masses has been scheduled.  The patient's subsequent history is as detailed below   INTERVAL HISTORY: [name] [name] returns today accompanied by her husband for followup of her locally advanced left breast cancer.  She is due for day 1 cycle 5 of 6 planned q. three-week doses of carboplatin/ docetaxel/ trastuzumab/ pertuzumab given in the  neoadjuvant setting. She receives Neulasta injections on day 2, given at [facility].  [name] [name] is feeling better today than when she was evaluated one week ago. Fatigue is still her biggest complaint. She received supportive IV fluids last week. Her diarrhea eventually improved, and both C. difficile and stool cultures were negative on 07/25/2013. She is eating and drinking better. She still has some occasional loose stools, but they have definitely improved. She also had a rash last week which has resolved, although she continues to have some mild itching. Both the rash and diarrhea seemed to begin somewhere around days 6-10 following chemotherapy.  REVIEW OF SYSTEMS: Aadvika denies any fevers or chills. She's had no signs of abnormal bleeding. Her energy level is still very low. She does have some shortness of breath with exertion, but denies any orthopnea, cough, phlegm production, peripheral swelling, chest pain, or palpitations. She has a runny nose, but no epistasis. She denies any significant nausea or emesis. She's had no change in urinary habits. She continues to have headaches, almost on a daily basis, for which she is taking Tylenol. She also admits to an increased amount of stress in her life. Her daughter just moved back in with her, along with her 3 children all under the age of 31. Rip Harbour denies any change in vision. She currently denies any unusual myalgias, arthralgias, bony pain, peripheral swelling, or increased peripheral neuropathy.  A detailed review of systems is otherwise stable and noncontributory.   PAST MEDICAL HISTORY: Past Medical History  Diagnosis Date  . Chest pain 2009

## 2013-07-31 NOTE — Progress Notes (Signed)
Blood continues at 185 cc/hr with no signs of distress.

## 2013-08-01 ENCOUNTER — Other Ambulatory Visit: Payer: Self-pay | Admitting: Oncology

## 2013-08-01 ENCOUNTER — Telehealth: Payer: Self-pay | Admitting: Oncology

## 2013-08-01 ENCOUNTER — Encounter: Payer: Self-pay | Admitting: Oncology

## 2013-08-01 ENCOUNTER — Ambulatory Visit (HOSPITAL_BASED_OUTPATIENT_CLINIC_OR_DEPARTMENT_OTHER): Payer: BC Managed Care – PPO

## 2013-08-01 ENCOUNTER — Encounter: Payer: Self-pay | Admitting: *Deleted

## 2013-08-01 ENCOUNTER — Ambulatory Visit (HOSPITAL_COMMUNITY): Payer: BC Managed Care – PPO

## 2013-08-01 VITALS — BP 143/87 | HR 65 | Temp 97.8°F | Resp 18

## 2013-08-01 DIAGNOSIS — Z5189 Encounter for other specified aftercare: Secondary | ICD-10-CM

## 2013-08-01 DIAGNOSIS — D649 Anemia, unspecified: Secondary | ICD-10-CM

## 2013-08-01 DIAGNOSIS — C50419 Malignant neoplasm of upper-outer quadrant of unspecified female breast: Secondary | ICD-10-CM

## 2013-08-01 DIAGNOSIS — C50412 Malignant neoplasm of upper-outer quadrant of left female breast: Secondary | ICD-10-CM

## 2013-08-01 LAB — PREPARE RBC (CROSSMATCH)

## 2013-08-01 MED ORDER — DIPHENHYDRAMINE HCL 25 MG PO CAPS
25.0000 mg | ORAL_CAPSULE | Freq: Once | ORAL | Status: AC
  Administered 2013-08-01: 25 mg via ORAL

## 2013-08-01 MED ORDER — HEPARIN SOD (PORK) LOCK FLUSH 100 UNIT/ML IV SOLN
500.0000 [IU] | Freq: Every day | INTRAVENOUS | Status: AC | PRN
  Administered 2013-08-01: 500 [IU]
  Filled 2013-08-01: qty 5

## 2013-08-01 MED ORDER — DIPHENHYDRAMINE HCL 25 MG PO CAPS
ORAL_CAPSULE | ORAL | Status: AC
  Filled 2013-08-01: qty 1

## 2013-08-01 MED ORDER — PEGFILGRASTIM INJECTION 6 MG/0.6ML
6.0000 mg | Freq: Once | SUBCUTANEOUS | Status: AC
  Administered 2013-08-01: 6 mg via SUBCUTANEOUS
  Filled 2013-08-01: qty 0.6

## 2013-08-01 MED ORDER — SODIUM CHLORIDE 0.9 % IJ SOLN
10.0000 mL | INTRAMUSCULAR | Status: AC | PRN
  Administered 2013-08-01: 10 mL
  Filled 2013-08-01: qty 10

## 2013-08-01 MED ORDER — ACETAMINOPHEN 325 MG PO TABS
650.0000 mg | ORAL_TABLET | Freq: Once | ORAL | Status: AC
  Administered 2013-08-01: 650 mg via ORAL

## 2013-08-01 MED ORDER — ACETAMINOPHEN 325 MG PO TABS
ORAL_TABLET | ORAL | Status: AC
  Filled 2013-08-01: qty 2

## 2013-08-01 MED ORDER — SODIUM CHLORIDE 0.9 % IV SOLN
250.0000 mL | Freq: Once | INTRAVENOUS | Status: AC
  Administered 2013-08-01: 250 mL via INTRAVENOUS

## 2013-08-01 NOTE — Progress Notes (Signed)
Chaplain made initial visit in infusion room. Pt was present with friend. She stated that she was doing well but asked that chaplain keep her in prayers. Chaplain will follow up as necessary.

## 2013-08-01 NOTE — Patient Instructions (Addendum)
Blood Transfusion Information WHAT IS A BLOOD TRANSFUSION? A transfusion is the replacement of blood or some of its parts. Blood is made up of multiple cells which provide different functions.  Red blood cells carry oxygen and are used for blood loss replacement.  White blood cells fight against infection.  Platelets control bleeding.  Plasma helps clot blood.  Other blood products are available for specialized needs, such as hemophilia or other clotting disorders. BEFORE THE TRANSFUSION  Who gives blood for transfusions?   You may be able to donate blood to be used at a later date on yourself (autologous donation).  Relatives can be asked to donate blood. This is generally not any safer than if you have received blood from a stranger. The same precautions are taken to ensure safety when a relative's blood is donated.  Healthy volunteers who are fully evaluated to make sure their blood is safe. This is blood bank blood. Transfusion therapy is the safest it has ever been in the practice of medicine. Before blood is taken from a donor, a complete history is taken to make sure that person has no history of diseases nor engages in risky social behavior (examples are intravenous drug use or sexual activity with multiple partners). The donor's travel history is screened to minimize risk of transmitting infections, such as malaria. The donated blood is tested for signs of infectious diseases, such as HIV and hepatitis. The blood is then tested to be sure it is compatible with you in order to minimize the chance of a transfusion reaction. If you or a relative donates blood, this is often done in anticipation of surgery and is not appropriate for emergency situations. It takes many days to process the donated blood. RISKS AND COMPLICATIONS Although transfusion therapy is very safe and saves many lives, the main dangers of transfusion include:   Getting an infectious disease.  Developing a  transfusion reaction. This is an allergic reaction to something in the blood you were given. Every precaution is taken to prevent this. The decision to have a blood transfusion has been considered carefully by your caregiver before blood is given. Blood is not given unless the benefits outweigh the risks. AFTER THE TRANSFUSION  Right after receiving a blood transfusion, you will usually feel much better and more energetic. This is especially true if your red blood cells have gotten low (anemic). The transfusion raises the level of the red blood cells which carry oxygen, and this usually causes an energy increase.  The nurse administering the transfusion will monitor you carefully for complications. HOME CARE INSTRUCTIONS  No special instructions are needed after a transfusion. You may find your energy is better. Speak with your caregiver about any limitations on activity for underlying diseases you may have. SEEK MEDICAL CARE IF:   Your condition is not improving after your transfusion.  You develop redness or irritation at the intravenous (IV) site. SEEK IMMEDIATE MEDICAL CARE IF:  Any of the following symptoms occur over the next 12 hours:  Shaking chills.  You have a temperature by mouth above 102 F (38.9 C), not controlled by medicine.  Chest, back, or muscle pain.  People around you feel you are not acting correctly or are confused.  Shortness of breath or difficulty breathing.  Dizziness and fainting.  You get a rash or develop hives.  You have a decrease in urine output.  Your urine turns a dark color or changes to pink, red, or brown. Any of the following   symptoms occur over the next 10 days:  You have a temperature by mouth above 102 F (38.9 C), not controlled by medicine.  Shortness of breath.  Weakness after normal activity.  The white part of the eye turns yellow (jaundice).  You have a decrease in the amount of urine or are urinating less often.  Your  urine turns a dark color or changes to pink, red, or brown. Document Released: 06/19/2000 Document Revised: 09/14/2011 Document Reviewed: 02/06/2008 [employer] Patient Information 2014 [employer].   Pegfilgrastim injection What is this medicine? PEGFILGRASTIM (peg fil GRA stim) helps the body make more white blood cells. It is used to prevent infection in people with low amounts of white blood cells following cancer treatment. This medicine may be used for other purposes; ask your health care provider or pharmacist if you have questions. COMMON BRAND NAME(S): Neulasta What should I tell my health care provider before I take this medicine? They need to know if you have any of these conditions: -sickle cell disease -an unusual or allergic reaction to pegfilgrastim, filgrastim, E.coli protein, other medicines, foods, dyes, or preservatives -pregnant or trying to get pregnant -breast-feeding How should I use this medicine? This medicine is for injection under the skin. It is usually given by a health care professional in a hospital or clinic setting. If you get this medicine at home, you will be taught how to prepare and give this medicine. Do not shake this medicine. Use exactly as directed. Take your medicine at regular intervals. Do not take your medicine more often than directed. It is important that you put your used needles and syringes in a special sharps container. Do not put them in a trash can. If you do not have a sharps container, call your pharmacist or healthcare provider to get one. Talk to your pediatrician regarding the use of this medicine in children. While this drug may be prescribed for children who weigh more than 45 kg for selected conditions, precautions do apply Overdosage: If you think you have taken too much of this medicine contact a poison control center or emergency room at once. NOTE: This medicine is only for you. Do not share this medicine with others. What if I  miss a dose? If you miss a dose, take it as soon as you can. If it is almost time for your next dose, take only that dose. Do not take double or extra doses. What may interact with this medicine? -lithium -medicines for growth therapy This list may not describe all possible interactions. Give your health care provider a list of all the medicines, herbs, non-prescription drugs, or dietary supplements you use. Also tell them if you smoke, drink alcohol, or use illegal drugs. Some items may interact with your medicine. What should I watch for while using this medicine? Visit your doctor for regular check ups. You will need important blood work done while you are taking this medicine. What side effects may I notice from receiving this medicine? Side effects that you should report to your doctor or health care professional as soon as possible: -allergic reactions like skin rash, itching or hives, swelling of the face, lips, or tongue -breathing problems -fever -pain, redness, or swelling where injected -shoulder pain -stomach or side pain Side effects that usually do not require medical attention (report to your doctor or health care professional if they continue or are bothersome): -aches, pains -headache -loss of appetite -nausea, vomiting -unusually tired This list may not describe all   possible side effects. Call your doctor for medical advice about side effects. You may report side effects to FDA at 1-800-FDA-1088. Where should I keep my medicine? Keep out of the reach of children. Store in a refrigerator between 2 and 8 degrees C (36 and 46 degrees F). Do not freeze. Keep in carton to protect from light. Throw away this medicine if it is left out of the refrigerator for more than 48 hours. Throw away any unused medicine after the expiration date. NOTE: This sheet is a summary. It may not cover all possible information. If you have questions about this medicine, talk to your doctor,  pharmacist, or health care provider.  2014, [employer]. (2008-01-23 15:41:44)  

## 2013-08-01 NOTE — Telephone Encounter (Signed)
, °

## 2013-08-02 ENCOUNTER — Other Ambulatory Visit: Payer: Self-pay | Admitting: Hematology and Oncology

## 2013-08-02 ENCOUNTER — Other Ambulatory Visit: Payer: Self-pay | Admitting: *Deleted

## 2013-08-02 DIAGNOSIS — C50919 Malignant neoplasm of unspecified site of unspecified female breast: Secondary | ICD-10-CM

## 2013-08-02 LAB — TYPE AND SCREEN
ABO/RH(D): O POS
Antibody Screen: NEGATIVE
Unit division: 0
Unit division: 0

## 2013-08-03 ENCOUNTER — Telehealth: Payer: Self-pay | Admitting: Oncology

## 2013-08-03 NOTE — Telephone Encounter (Signed)
, °

## 2013-08-04 ENCOUNTER — Other Ambulatory Visit (HOSPITAL_BASED_OUTPATIENT_CLINIC_OR_DEPARTMENT_OTHER): Payer: BC Managed Care – PPO

## 2013-08-04 DIAGNOSIS — C50412 Malignant neoplasm of upper-outer quadrant of left female breast: Secondary | ICD-10-CM

## 2013-08-04 DIAGNOSIS — C50419 Malignant neoplasm of upper-outer quadrant of unspecified female breast: Secondary | ICD-10-CM

## 2013-08-04 DIAGNOSIS — C50919 Malignant neoplasm of unspecified site of unspecified female breast: Secondary | ICD-10-CM

## 2013-08-04 DIAGNOSIS — D649 Anemia, unspecified: Secondary | ICD-10-CM

## 2013-08-04 LAB — CBC WITH DIFFERENTIAL/PLATELET
BASO%: 1.2 % (ref 0.0–2.0)
Basophils Absolute: 0.2 10*3/uL — ABNORMAL HIGH (ref 0.0–0.1)
EOS%: 0 % (ref 0.0–7.0)
Eosinophils Absolute: 0 10*3/uL (ref 0.0–0.5)
HCT: 39.4 % (ref 34.8–46.6)
HGB: 13 g/dL (ref 11.6–15.9)
LYMPH%: 10.4 % — ABNORMAL LOW (ref 14.0–49.7)
MCH: 30.3 pg (ref 25.1–34.0)
MCHC: 33.1 g/dL (ref 31.5–36.0)
MCV: 91.6 fL (ref 79.5–101.0)
MONO#: 0.2 10*3/uL (ref 0.1–0.9)
MONO%: 1.1 % (ref 0.0–14.0)
NEUT#: 16.8 10*3/uL — ABNORMAL HIGH (ref 1.5–6.5)
NEUT%: 87.3 % — ABNORMAL HIGH (ref 38.4–76.8)
Platelets: 173 10*3/uL (ref 145–400)
RBC: 4.3 10*6/uL (ref 3.70–5.45)
RDW: 18.4 % — ABNORMAL HIGH (ref 11.2–14.5)
WBC: 19.2 10*3/uL — ABNORMAL HIGH (ref 3.9–10.3)
lymph#: 2 10*3/uL (ref 0.9–3.3)

## 2013-08-04 LAB — HOLD TUBE, BLOOD BANK

## 2013-08-04 LAB — MAGNESIUM (CC13): Magnesium: 1.7 mg/dl (ref 1.5–2.5)

## 2013-08-04 LAB — COMPREHENSIVE METABOLIC PANEL (CC13)
ALT: 136 U/L — ABNORMAL HIGH (ref 0–55)
AST: 52 U/L — ABNORMAL HIGH (ref 5–34)
Albumin: 4 g/dL (ref 3.5–5.0)
Alkaline Phosphatase: 98 U/L (ref 40–150)
Anion Gap: 10 mEq/L (ref 3–11)
BUN: 16.4 mg/dL (ref 7.0–26.0)
CO2: 25 mEq/L (ref 22–29)
Calcium: 9.5 mg/dL (ref 8.4–10.4)
Chloride: 103 mEq/L (ref 98–109)
Creatinine: 0.8 mg/dL (ref 0.6–1.1)
Glucose: 76 mg/dl (ref 70–140)
Potassium: 3.8 mEq/L (ref 3.5–5.1)
Sodium: 139 mEq/L (ref 136–145)
Total Bilirubin: 0.91 mg/dL (ref 0.20–1.20)
Total Protein: 7.1 g/dL (ref 6.4–8.3)

## 2013-08-07 ENCOUNTER — Other Ambulatory Visit (HOSPITAL_BASED_OUTPATIENT_CLINIC_OR_DEPARTMENT_OTHER): Payer: BC Managed Care – PPO

## 2013-08-07 ENCOUNTER — Ambulatory Visit (HOSPITAL_BASED_OUTPATIENT_CLINIC_OR_DEPARTMENT_OTHER): Payer: BC Managed Care – PPO | Admitting: Hematology and Oncology

## 2013-08-07 ENCOUNTER — Other Ambulatory Visit: Payer: Self-pay

## 2013-08-07 ENCOUNTER — Ambulatory Visit (HOSPITAL_BASED_OUTPATIENT_CLINIC_OR_DEPARTMENT_OTHER): Payer: BC Managed Care – PPO

## 2013-08-07 VITALS — BP 128/73 | HR 89 | Temp 98.7°F | Resp 18 | Ht 60.0 in | Wt 156.7 lb

## 2013-08-07 DIAGNOSIS — E860 Dehydration: Secondary | ICD-10-CM

## 2013-08-07 DIAGNOSIS — C50919 Malignant neoplasm of unspecified site of unspecified female breast: Secondary | ICD-10-CM

## 2013-08-07 DIAGNOSIS — D649 Anemia, unspecified: Secondary | ICD-10-CM

## 2013-08-07 DIAGNOSIS — R197 Diarrhea, unspecified: Secondary | ICD-10-CM

## 2013-08-07 DIAGNOSIS — C50419 Malignant neoplasm of upper-outer quadrant of unspecified female breast: Secondary | ICD-10-CM

## 2013-08-07 LAB — CBC WITH DIFFERENTIAL/PLATELET
BASO%: 0.4 % (ref 0.0–2.0)
Basophils Absolute: 0 10*3/uL (ref 0.0–0.1)
EOS%: 0.1 % (ref 0.0–7.0)
Eosinophils Absolute: 0 10*3/uL (ref 0.0–0.5)
HCT: 40.5 % (ref 34.8–46.6)
HGB: 13.6 g/dL (ref 11.6–15.9)
LYMPH%: 25.5 % (ref 14.0–49.7)
MCH: 30.6 pg (ref 25.1–34.0)
MCHC: 33.6 g/dL (ref 31.5–36.0)
MCV: 91 fL (ref 79.5–101.0)
MONO#: 1.6 10*3/uL — ABNORMAL HIGH (ref 0.1–0.9)
MONO%: 27 % — ABNORMAL HIGH (ref 0.0–14.0)
NEUT#: 2.9 10*3/uL (ref 1.5–6.5)
NEUT%: 47 % (ref 38.4–76.8)
Platelets: 138 10*3/uL — ABNORMAL LOW (ref 145–400)
RBC: 4.46 10*6/uL (ref 3.70–5.45)
RDW: 17.8 % — ABNORMAL HIGH (ref 11.2–14.5)
WBC: 6.1 10*3/uL (ref 3.9–10.3)
lymph#: 1.6 10*3/uL (ref 0.9–3.3)

## 2013-08-07 LAB — COMPREHENSIVE METABOLIC PANEL (CC13)
ALT: 95 U/L — ABNORMAL HIGH (ref 0–55)
AST: 37 U/L — ABNORMAL HIGH (ref 5–34)
Albumin: 4.2 g/dL (ref 3.5–5.0)
Alkaline Phosphatase: 90 U/L (ref 40–150)
Anion Gap: 12 mEq/L — ABNORMAL HIGH (ref 3–11)
BUN: 10.7 mg/dL (ref 7.0–26.0)
CO2: 26 mEq/L (ref 22–29)
Calcium: 9.9 mg/dL (ref 8.4–10.4)
Chloride: 103 mEq/L (ref 98–109)
Creatinine: 0.9 mg/dL (ref 0.6–1.1)
Glucose: 88 mg/dl (ref 70–140)
Potassium: 3.7 mEq/L (ref 3.5–5.1)
Sodium: 141 mEq/L (ref 136–145)
Total Bilirubin: 0.71 mg/dL (ref 0.20–1.20)
Total Protein: 7.6 g/dL (ref 6.4–8.3)

## 2013-08-07 MED ORDER — SODIUM CHLORIDE 0.9 % IJ SOLN
10.0000 mL | INTRAMUSCULAR | Status: DC | PRN
  Administered 2013-08-07: 10 mL via INTRAVENOUS
  Filled 2013-08-07: qty 10

## 2013-08-07 MED ORDER — SODIUM CHLORIDE 0.9 % IV SOLN
INTRAVENOUS | Status: AC
  Administered 2013-08-07: 14:00:00 via INTRAVENOUS

## 2013-08-07 MED ORDER — POTASSIUM CHLORIDE ER 10 MEQ PO TBCR
EXTENDED_RELEASE_TABLET | ORAL | Status: DC
Start: 2013-08-07 — End: 2013-08-14

## 2013-08-07 MED ORDER — HEPARIN SOD (PORK) LOCK FLUSH 100 UNIT/ML IV SOLN
500.0000 [IU] | Freq: Once | INTRAVENOUS | Status: AC
  Administered 2013-08-07: 500 [IU] via INTRAVENOUS
  Filled 2013-08-07: qty 5

## 2013-08-07 NOTE — Patient Instructions (Signed)
Dehydration, Adult Dehydration is when you lose more fluids from the body than you take in. Vital organs like the kidneys, brain, and heart cannot function without a proper amount of fluids and salt. Any loss of fluids from the body can cause dehydration.  CAUSES   Vomiting.  Diarrhea.  Excessive sweating.  Excessive urine output.  Fever. SYMPTOMS  Mild dehydration  Thirst.  Dry lips.  Slightly dry mouth. Moderate dehydration  Very dry mouth.  Sunken eyes.  Skin does not bounce back quickly when lightly pinched and released.  Dark urine and decreased urine production.  Decreased tear production.  Headache. Severe dehydration  Very dry mouth.  Extreme thirst.  Rapid, weak pulse (more than 100 beats per minute at rest).  Cold hands and feet.  Not able to sweat in spite of heat and temperature.  Rapid breathing.  Blue lips.  Confusion and lethargy.  Difficulty being awakened.  Minimal urine production.  No tears. DIAGNOSIS  Your caregiver will diagnose dehydration based on your symptoms and your exam. Blood and urine tests will help confirm the diagnosis. The diagnostic evaluation should also identify the cause of dehydration. TREATMENT  Treatment of mild or moderate dehydration can often be done at home by increasing the amount of fluids that you drink. It is best to drink small amounts of fluid more often. Drinking too much at one time can make vomiting worse. Refer to the home care instructions below. Severe dehydration needs to be treated at the hospital where you will probably be given intravenous (IV) fluids that contain water and electrolytes. HOME CARE INSTRUCTIONS   Ask your caregiver about specific rehydration instructions.  Drink enough fluids to keep your urine clear or pale yellow.  Drink small amounts frequently if you have nausea and vomiting.  Eat as you normally do.  Avoid:  Foods or drinks high in sugar.  Carbonated  drinks.  Juice.  Extremely hot or cold fluids.  Drinks with caffeine.  Fatty, greasy foods.  Alcohol.  Tobacco.  Overeating.  Gelatin desserts.  Wash your hands well to avoid spreading bacteria and viruses.  Only take over-the-counter or prescription medicines for pain, discomfort, or fever as directed by your caregiver.  Ask your caregiver if you should continue all prescribed and over-the-counter medicines.  Keep all follow-up appointments with your caregiver. SEEK MEDICAL CARE IF:  You have abdominal pain and it increases or stays in one area (localizes).  You have a rash, stiff neck, or severe headache.  You are irritable, sleepy, or difficult to awaken.  You are weak, dizzy, or extremely thirsty. SEEK IMMEDIATE MEDICAL CARE IF:   You are unable to keep fluids down or you get worse despite treatment.  You have frequent episodes of vomiting or diarrhea.  You have blood or green matter (bile) in your vomit.  You have blood in your stool or your stool looks black and tarry.  You have not urinated in 6 to 8 hours, or you have only urinated a small amount of very dark urine.  You have a fever.  You faint. MAKE SURE YOU:   Understand these instructions.  Will watch your condition.  Will get help right away if you are not doing well or get worse. Document Released: 06/22/2005 Document Revised: 09/14/2011 Document Reviewed: 02/09/2011 [employer] Patient Information 2014 [employer].  

## 2013-08-07 NOTE — Telephone Encounter (Signed)
Spoke with patient in chemo about stopping magnesium and changing 20 meq of Potassium daily to 50meq of potassium every other day per Dr Earnest Conroy.  Prescription was escribed to [facility] in [place].  Pt verbalized understanding. Will call back if she has any questions.

## 2013-08-07 NOTE — Progress Notes (Signed)
ID: [name] [name] OB: [birth year]  MR#: [id number]  CSN#:[id number]  PCP: Rubbie Battiest, MD ; Pearson Forster, NP GYN:   Josefa Half, MD  SU: Alphonsa Overall, MD [place]:  Thea Silversmith, MD OTHER MD: Freida Busman, MD;  Neysa Bonito, MD  DIAGNOSIS: left breast cancer  CHIEF COMPLAINT:  Follow up visit after chemotherapy  HISTORY OF PRESENT ILLNESS: [name] palpated a mass in her left breast in late August 2014. She was already scheduled for screening mammography at [facility] and this was performed 03/13/2013. Indeed a possible mass was noted in the left breast and on 03/29/2013 the patient underwent diagnostic left mammography and left ultrasonography. This showed a 2.5 cm irregular mass in the upper outer quadrant of the left breast. This was palpable.by ultrasound there was a 1.6 cm irregular hypoechoic mass in the area in question, and in addition to enlarged left axillary lymph nodes were noted, the largest measuring 2.5 cm.  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  Bilateral breast MRI 04/11/2013 shows 3 abnormal enhancing masses in the upper outer quadrant of the left breast measuring altogether 6.4 cm. The largest separate mass measured 2.7 cm. There were abnormal or large lymph nodes in the left axilla. Ultrasound guided biopsy of the 2 smaller masses has been scheduled.  The patient's subsequent history is as detailed below   INTERVAL HISTORY: [name] is here today for followup visit. Since the time of last visit her skin rash is completely resolved. Diarrhea is improved to 2-3 times daily. She still complains of tiredness, dry mouth,  taste change and decrease in appetite. The stool workup for C. difficile and stool cultures were negative. She also complains of mild dizziness. She says that she did not take any breakfast this morning. Denies any fever, shortness of breath, chest pains, tingling or numbness. She did receive 2 units of packed RBCs last week for anemia and she feels slightly better after blood transfusion   REVIEW OF SYSTEMS: All 14 review of systems have been assessed and pertinent symptoms are listed in interval history  PAST MEDICAL HISTORY: Past Medical History  Diagnosis Date  . Chest pain 2009    Consultation-[facility], negative chest CT; nl echo in 2005; h/o palpitations  . Palpitations   . Degenerative joint disease     + degenerative joint disease of the lumbosacral spine  . Colitis 2010    not IBD  . Herpes simplex type II infection   . Allergic rhinitis   . Hypercholesteremia     "slightly high"  . Anxiety   . Heart murmur     "small"  . GERD (gastroesophageal reflux disease)     "a little"  . Breast cancer 03/31/13    left  . Cancer   . Depression   . Hypertension   . Anemia, unspecified 05/30/2013    PAST SURGICAL HISTORY: Past Surgical History  Procedure Laterality Date  . Colonoscopy  10/2010    proctitis; melanosis coli  . Tubal ligation    . Breast excisional biopsy  04/2003    Left; benign disease  . Right oophorectomy  03/13/2007  . Abdominal  hysterectomy  03/13/2007    TAH ?BSO--Dr Levin Bacon, [place]  . Eye surgery Left     "fix lazy eye"  . Portacath placement Right 04/21/2013    Procedure: INSERTION PORT-A-CATH;  Surgeon: [name], MD;  Location: [facility];  Service: General;  Laterality: Right;    FAMILY HISTORY Family History  Problem Relation Age of Onset  . Aneurysm Mother     Cerebral  . Hypertension Mother   . Hyperlipidemia Mother   . Stroke Mother   . Coronary artery disease Father   . Hypertension Father   . Hyperlipidemia Father   . Lung  cancer Brother   . Cancer Brother     Lung  The patient's father died at the age of 65 from a myocardial infarction. The patient's mother died at the age of 73 from a stroke. The patient had 6 brothers, 2 sisters. One brother has a history of lung cancer in the setting of tobacco abuse. Another brother had multiple myeloma. There is no history of breast or ovarian cancer in the family  GYNECOLOGIC HISTORY:  Menarche age 46, first live birth age 51, the patient is [place] P4. She status post abdominal hysterectomy with bilateral salpingo-oophorectomy. She never took hormone replacement.   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 Her husband [name] works in Charity fundraiser. Daughter [name] lives at home with the patient. She is not employed. Son Tuana Hoheisel lives in [place] and is not employed. Daughter Maggie Schwalbe Dillard lives in [place] and is a Barrister's clerk. Daughter Ernst Spell is a Education officer, museum in [place]. The patient has 10 grandchildren. She attends [employer]     ADVANCED DIRECTIVES: Not in place   HEALTH MAINTENANCE:  (Updated 05/30/2013)  History  Substance Use Topics  . Smoking status: Former Smoker -- 0.50 packs/day for 15 years    Types: Cigarettes    Quit date: 07/06/1982  . Smokeless tobacco: Never Used  . Alcohol Use: No     Colonoscopy:2012  PAP:2014   Bone density: Never  Lipid panel: Not on file    Allergies  Allergen Reactions  . Doxycycline Other (See Comments)    Unknown    Current Outpatient Prescriptions  Medication Sig Dispense Refill  . ALPRAZolam (XANAX) 1 MG tablet 1 po TID prn anxiety  90 tablet  0  . atenolol (TENORMIN) 50 MG tablet TAKE 1 TABLET BY MOUTH EVERY DAY FOR PALPITATION  90 tablet  1  . cholestyramine (QUESTRAN) 4 G packet Take 1 packet (4 g total) by mouth 2 (two) times daily as needed (diarrhea).  30 each  1  . citalopram (CELEXA) 40 MG tablet  Take 40 mg by mouth every evening.      . clindamycin (CLINDAGEL) 1 % gel Apply topically 2 (two) times daily.  30 g  1  . dexamethasone (DECADRON) 4 MG tablet Take 2 tablets (8 mg total) by mouth 2 (two) times daily with a meal. Take two times a day the day before Taxotere. Then take two times a day starting the day after chemo for 3 days.  30 tablet  1  . diphenhydramine-acetaminophen (TYLENOL PM) 25-500 MG TABS Take 2 tablets by mouth at bedtime.      . lidocaine-prilocaine (EMLA) cream Apply topically as needed.  30 g  0  . LORazepam (ATIVAN) 0.5 MG tablet TAKE 1 TABLET EVERY 6 HOURS AS NEEDED FOR  NAUSEA OR VOMITING  30 tablet  0  . magnesium oxide (MAG-OX) 400 (241.3 MG) MG tablet Take 1 tablet (400 mg total) by mouth daily.  10 tablet  1  . ondansetron (ZOFRAN) 8 MG tablet Take 1 tablet (8 mg total) by mouth 2 (two) times daily. Take two times a day starting the day after chemo for 3 days. Then take two times a day as needed for nausea or vomiting.  30 tablet  1  . potassium chloride SA (K-DUR,KLOR-CON) 20 MEQ tablet Take 1 tablet (20 mEq total) by mouth 2 (two) times daily.  20 tablet  1  . valACYclovir (VALTREX) 1000 MG tablet Take 1,000 mg by mouth daily.      . Alum & Mag Hydroxide-Simeth (MAGIC MOUTHWASH) SOLN Take 5 mLs by mouth 4 (four) times daily. X 7 - 10 days  240 mL  1  . prochlorperazine (COMPAZINE) 10 MG tablet Take 1 tablet (10 mg total) by mouth every 6 (six) hours as needed (Nausea or vomiting).  30 tablet  1   Current Facility-Administered Medications  Medication Dose Route Frequency Provider Last Rate Last Dose  . 0.9 %  sodium chloride infusion   Intravenous Continuous Wilmon Arms, MD        OBJECTIVE: Middle-aged African American woman in no acute distress Filed Vitals:   08/07/13 1129  BP: 97/63  Pulse: 89  Temp: 98.7 F (37.1 C)  Resp: 18     Body mass index is 30.6 kg/(m^2).    ECOG FS:1 Filed Weights   08/07/13 1129  Weight: 156 lb 11.2 oz (71.079 kg)    HEENT PERRLA, Sclerae -anicteric, pupils equal and round Oropharynx clear and moist-- no thrush No cervical or supraclavicular adenopathy Lungs no rales or rhonchi Heart regular rate and rhythm Abd soft, nontender, positive bowel sounds MSK no focal spinal tenderness, no upper extremity lymphedema Neuro: nonfocal, well oriented, appropriate affect Breasts: I did not palpate a left breast mass and there are no skin or nipple changes of concern. The right axilla is benign. The left breast is unremarkable NEURO:  Nonfocal. Well oriented.   Positive affect. Skin: Skin rash resolved  LAB RESULTS:   Lab Results  Component Value Date   WBC 6.1 08/07/2013   NEUTROABS 2.9 08/07/2013   HGB 13.6 08/07/2013   HCT 40.5 08/07/2013   MCV 91.0 08/07/2013   PLT 138* 08/07/2013      Chemistry      Component Value Date/Time   NA 141 08/07/2013 1057   NA 139 06/06/2008 0545   K 3.7 08/07/2013 1057   K 3.8 06/06/2008 0545   CL 104 06/06/2008 0545   CO2 26 08/07/2013 1057   CO2 26 06/06/2008 0545   BUN 10.7 08/07/2013 1057   BUN 6 06/06/2008 0545   CREATININE 0.9 08/07/2013 1057   CREATININE 0.77 06/06/2008 0545      Component Value Date/Time   CALCIUM 9.9 08/07/2013 1057   CALCIUM 8.7 06/06/2008 0545   ALKPHOS 90 08/07/2013 1057   ALKPHOS 62 04/12/2008 1725   AST 37* 08/07/2013 1057   AST 21 04/12/2008 1725   ALT 95* 08/07/2013 1057   ALT 28 04/12/2008 1725   BILITOT 0.71 08/07/2013 1057   BILITOT 0.5 04/12/2008 1725       STUDIES:  DG Chest 2 View  07/03/2013   CLINICAL DATA: Breast cancer with new onset cough and shortness of  breath.  EXAM:  CHEST 2 VIEW  COMPARISON: CT chest  04/26/2013 and chest radiograph 04/21/2013.  FINDINGS:  Trachea is midline. Heart size normal. Right subclavian Port-A-Cath  tip is in the SVC. Lungs are clear. No pleural fluid.  IMPRESSION:  No acute findings.  Electronically Signed  By: Lorin Picket M.D.  On: 07/03/2013 11:01   Echocardiogram on 04/20/2013 showed an  ejection fraction of 60-65%.  This is due to be repeated in late January, and it has been ordered but not yet scheduled as of 07/10/2013.   Mr Breast Bilateral W Wo Contrast  06/12/2013   CLINICAL DATA:  Personal history of left breast cancer. The patient is currently on chemotherapy. Assess for chemotherapy response.  EXAM: BILATERAL BREAST MRI WITH AND WITHOUT CONTRAST  LABS:  Does not apply  TECHNIQUE: Multiplanar, multisequence MR images of both breasts were obtained prior to and following the intravenous administration of 47m of MultiHance.  THREE-DIMENSIONAL MR IMAGE RENDERING ON INDEPENDENT WORKSTATION:  Three-dimensional MR images were rendered by post-processing of the original MR data on an independent workstation. The three-dimensional MR images were interpreted, and findings are reported in the following complete MRI report for this study. Three dimensional images were evaluated at the independent DynaCad workstation  COMPARISON:  Previous exams  FINDINGS: Breast composition: b.  Scattered fibroglandular tissue  Background parenchymal enhancement: Mild  Right breast: No mass or abnormal enhancement.  Left breast: The previously noted 2 mass/biopsy proven cancer in the upper-outer quadrant left breast are significantly smaller compared to the previous exam. The largest mass is in the superior posterior upper-outer quadrant left breast currently measuring 1.6 cm diameter x 2 cm AP x 1.1 cm CC. The 2nd mass is inferior to the largest mass and currently measures 1.5 cm diameter x 0.8 cm AP x 0.9 cm cc. Both of these previously biopsied proving cancers demonstrate slow persistent kinetics. The previously biopsy proven fat necrosis in the superior anterior left breast is smaller currently measuring 0.8 cm diameter x 0.8 cm AP x 0.5 cm CC.  Lymph nodes: There is significant interval decrease in size of left axillary lymph nodes. The largest lymph node currently measures measures 1 cm.  Ancillary findings:   None.  IMPRESSION: Previously biopsy proven cancers in the upper-outer quadrant left breast are significantly smaller compared to the prior exam. The previously noted enlarged lymph nodes in the left axilla are significantly smaller, largest measures 1 cm currently.  RECOMMENDATION: Treatment plan  BI-RADS CATEGORY  6: Known biopsy-proven malignancy - appropriate action should be taken.   Electronically Signed   By: WAbelardo DieselM.D.   On: 06/12/2013 12:38     ASSESSMENT/PLAN: 53y.o. [place]   (1)  status post left breast biopsy 03/29/2013 for a clinical T2-T3 pN1, stage IIB-IIIA invasive ductal carcinoma, grade 2-3, estrogen receptor 39% positive (with moderate staining intensity), progesterone receptor negative, with an MIB-1 of 83%, and HER-2 amplified by CISH, with a ratio of 7.5 and 15 HER-2 copies per cell  (2) receiving neoadjuvant chemotherapy, with trastuzumab/ pertuzumab/ carboplatin/docetaxel started 05/01/2013, to be repeated every 3 weeks x6. [facility]: Echocardiogram scheduled before her next treatment, which will be January 26.   She is  scheduled to see her surgeonDr. NVarney Biles25. On February 23 she will have a second breast MRI so that can make a definitive decision regarding surgery at the 08/30/2013 visit   (3) Diarrhea most likely secondary to chemotherapy induced-improving:  stool workup including c-diff,  ova, parasites and stool cultures were negative  (4) skin rash most likely secondary to pertuzumab: Resolved. She  received [place] chemotherapy for cycle 5 on 07/31/2013. pertuzumab was omitted in view  side effects(skin rash)  (5) Dehydration secondary to #3/poor po intake: We'll arrange for 1 L of IV fluids with normal saline.   (6) Anemia most likely secondary to chemotherapy induced:  iron panel is within normal range. Her hemoglobin and hematocrit is stable after 2 units of packed  RBCs.  (7)Oral candidiasis resolved: Continue Magic mouthwash as scheduled   (8) next follow up visit in 1 week with CBC differential, CMP and to review chemotoxicities    (9) followup on 08/21/2013 for 6 cycle of neoadjuvant [facility]   chemotherapy    She voices understanding and agreement with the above plan. She will call with any changes or problems prior to her next appointment.    Wilmon Arms, MD  Medical oncology   08/07/2013 12:04 PM

## 2013-08-08 ENCOUNTER — Other Ambulatory Visit (HOSPITAL_COMMUNITY): Payer: Self-pay | Admitting: Family Medicine

## 2013-08-14 ENCOUNTER — Telehealth: Payer: Self-pay

## 2013-08-14 ENCOUNTER — Other Ambulatory Visit: Payer: BC Managed Care – PPO

## 2013-08-14 ENCOUNTER — Ambulatory Visit (HOSPITAL_BASED_OUTPATIENT_CLINIC_OR_DEPARTMENT_OTHER): Payer: BC Managed Care – PPO

## 2013-08-14 ENCOUNTER — Other Ambulatory Visit: Payer: Self-pay

## 2013-08-14 ENCOUNTER — Encounter: Payer: Self-pay | Admitting: Physician Assistant

## 2013-08-14 ENCOUNTER — Other Ambulatory Visit (HOSPITAL_BASED_OUTPATIENT_CLINIC_OR_DEPARTMENT_OTHER): Payer: BC Managed Care – PPO

## 2013-08-14 ENCOUNTER — Ambulatory Visit (HOSPITAL_BASED_OUTPATIENT_CLINIC_OR_DEPARTMENT_OTHER): Payer: BC Managed Care – PPO | Admitting: Physician Assistant

## 2013-08-14 VITALS — BP 103/70 | HR 77 | Temp 98.3°F | Resp 20 | Ht 60.0 in | Wt 157.0 lb

## 2013-08-14 DIAGNOSIS — C50419 Malignant neoplasm of upper-outer quadrant of unspecified female breast: Secondary | ICD-10-CM

## 2013-08-14 DIAGNOSIS — Z171 Estrogen receptor negative status [ER-]: Secondary | ICD-10-CM

## 2013-08-14 DIAGNOSIS — E876 Hypokalemia: Secondary | ICD-10-CM

## 2013-08-14 DIAGNOSIS — C50412 Malignant neoplasm of upper-outer quadrant of left female breast: Secondary | ICD-10-CM

## 2013-08-14 DIAGNOSIS — D696 Thrombocytopenia, unspecified: Secondary | ICD-10-CM

## 2013-08-14 DIAGNOSIS — D6481 Anemia due to antineoplastic chemotherapy: Secondary | ICD-10-CM

## 2013-08-14 DIAGNOSIS — T451X5A Adverse effect of antineoplastic and immunosuppressive drugs, initial encounter: Secondary | ICD-10-CM

## 2013-08-14 DIAGNOSIS — E860 Dehydration: Secondary | ICD-10-CM

## 2013-08-14 DIAGNOSIS — C50919 Malignant neoplasm of unspecified site of unspecified female breast: Secondary | ICD-10-CM

## 2013-08-14 DIAGNOSIS — D649 Anemia, unspecified: Secondary | ICD-10-CM

## 2013-08-14 DIAGNOSIS — R197 Diarrhea, unspecified: Secondary | ICD-10-CM

## 2013-08-14 LAB — CBC WITH DIFFERENTIAL/PLATELET
BASO%: 0.1 % (ref 0.0–2.0)
Basophils Absolute: 0 10*3/uL (ref 0.0–0.1)
EOS%: 0 % (ref 0.0–7.0)
Eosinophils Absolute: 0 10*3/uL (ref 0.0–0.5)
HCT: 32.5 % — ABNORMAL LOW (ref 34.8–46.6)
HGB: 11 g/dL — ABNORMAL LOW (ref 11.6–15.9)
LYMPH%: 14.9 % (ref 14.0–49.7)
MCH: 29.6 pg (ref 25.1–34.0)
MCHC: 33.8 g/dL (ref 31.5–36.0)
MCV: 87.4 fL (ref 79.5–101.0)
MONO#: 0.9 10*3/uL (ref 0.1–0.9)
MONO%: 7.2 % (ref 0.0–14.0)
NEUT#: 9.3 10*3/uL — ABNORMAL HIGH (ref 1.5–6.5)
NEUT%: 77.8 % — ABNORMAL HIGH (ref 38.4–76.8)
Platelets: 53 10*3/uL — ABNORMAL LOW (ref 145–400)
RBC: 3.72 10*6/uL (ref 3.70–5.45)
RDW: 15.8 % — ABNORMAL HIGH (ref 11.2–14.5)
WBC: 11.9 10*3/uL — ABNORMAL HIGH (ref 3.9–10.3)
lymph#: 1.8 10*3/uL (ref 0.9–3.3)

## 2013-08-14 LAB — COMPREHENSIVE METABOLIC PANEL (CC13)
ALT: 35 U/L (ref 0–55)
AST: 20 U/L (ref 5–34)
Albumin: 3.8 g/dL (ref 3.5–5.0)
Alkaline Phosphatase: 93 U/L (ref 40–150)
Anion Gap: 10 mEq/L (ref 3–11)
BUN: 6 mg/dL — ABNORMAL LOW (ref 7.0–26.0)
CO2: 29 mEq/L (ref 22–29)
Calcium: 9.3 mg/dL (ref 8.4–10.4)
Chloride: 104 mEq/L (ref 98–109)
Creatinine: 0.8 mg/dL (ref 0.6–1.1)
Glucose: 97 mg/dl (ref 70–140)
Potassium: 2.7 mEq/L — CL (ref 3.5–5.1)
Sodium: 144 mEq/L (ref 136–145)
Total Bilirubin: 0.47 mg/dL (ref 0.20–1.20)
Total Protein: 6.8 g/dL (ref 6.4–8.3)

## 2013-08-14 LAB — MAGNESIUM (CC13): Magnesium: 1.4 mg/dl — CL (ref 1.5–2.5)

## 2013-08-14 MED ORDER — SODIUM CHLORIDE 0.9 % IJ SOLN
10.0000 mL | INTRAMUSCULAR | Status: DC | PRN
  Administered 2013-08-14: 10 mL via INTRAVENOUS
  Filled 2013-08-14: qty 10

## 2013-08-14 MED ORDER — SODIUM CHLORIDE 0.9 % IV SOLN
INTRAVENOUS | Status: DC
  Administered 2013-08-14: 15:00:00 via INTRAVENOUS
  Filled 2013-08-14: qty 1000

## 2013-08-14 MED ORDER — HEPARIN SOD (PORK) LOCK FLUSH 100 UNIT/ML IV SOLN
500.0000 [IU] | Freq: Once | INTRAVENOUS | Status: AC
  Administered 2013-08-14: 500 [IU] via INTRAVENOUS
  Filled 2013-08-14: qty 5

## 2013-08-14 MED ORDER — POTASSIUM CHLORIDE ER 10 MEQ PO TBCR: 20.0000 meq | EXTENDED_RELEASE_TABLET | Freq: Three times a day (TID) | ORAL | Status: DC

## 2013-08-14 NOTE — Telephone Encounter (Signed)
Message copied by Marian Sorrow on Mon Aug 14, 2013  3:43 PM ------      Message from: Wilmon Arms      Created: Mon Aug 14, 2013  2:06 PM             Pl call patient to take KCL 20 meq three times daily for two days folowed by twice daily and come back on Thursday for labs            Magnesium oxide 400mg  po twice daily for 3 days            If she has diarrhoea 4-5 times/day, she should come back for iv fluids, and visit asap ------

## 2013-08-14 NOTE — Telephone Encounter (Signed)
Micah Flesher, PA saw this patient today.  Patient was given 1G magnesium and 59meq potassium in IV fluids today.  Amy also sent in new prescription for potassium and advised patient will not need PO magnesium after receiving it in her IV fluids.  Advised patient to call with any further questions.  Patient voiced understanding.

## 2013-08-14 NOTE — Patient Instructions (Signed)
Hypomagnesemia Magnesium is a common ion (mineral) in the body which is needed for metabolism. It is about how the body handles food and other chemical reactions necessary for life. Only about 2% of the magnesium in our body is found in the blood. When this is low, it is called hypomagnesemia. The blood will measure only a tiny amount of the magnesium in our body. When it is low in our blood, it does not mean that the whole body supply is low. The normal serum concentration ranges from 1.8-2.5 mEq/L. When the level gets to be less than 1.0 mEq/L, a number of problems begin to happen.  CAUSES   Receiving intravenous fluids without magnesium replacement.  Loss of magnesium from the bowel by naso-gastric suction.  Loss of magnesium from nausea and vomiting or severe diarrhea. Any of the inflammatory bowel conditions can cause this.  Abuse of alcohol often leads to low serum magnesium.  An inherited form of magnesium loss happens when the kidneys lose magnesium. This is called familial or primary hypomagnesemia.  Some medications such as diuretics also cause the loss of magnesium. SYMPTOMS  These following problems are worse if the changes in magnesium levels come on suddenly.  Tremor.  Confusion.  Muscle weakness.  Over-sensitive to sights and sounds.  Sensitive reflexes.  Depression.  Muscular fibrillations.  Over-reactivity of the nerves.  Irritability.  Psychosis.  Spasms of the hand muscles.  Tetany (where the muscles go into uncontrollable spasms). DIAGNOSIS  This condition can be diagnosed by blood tests. TREATMENT   In emergency, magnesium can be given intravenously (by vein).  If the condition is less worrisome, it can be corrected by diet. High levels of magnesium are found in green leafy vegetables, peas, beans and nuts among other things. It can also be given through medications by mouth.  If it is being caused by medications, changes can be made.  If  alcohol is a problem, help is available if there are difficulties giving it up. Document Released: 03/18/2005 Document Revised: 09/14/2011 Document Reviewed: 02/10/2008 ExitCare Patient Information 2014 [employer]. Hypokalemia Hypokalemia means that the amount of potassium in the blood is lower than normal.Potassium is a chemical, called an electrolyte, that helps regulate the amount of fluid in the body. It also stimulates muscle contraction and helps nerves function properly.Most of the body's potassium is inside of cells, and only a very small amount is in the blood. Because the amount in the blood is so small, minor changes can be life-threatening. CAUSES  Antibiotics.  Diarrhea or vomiting.  Using laxatives too much, which can cause diarrhea.  Chronic kidney disease.  Water pills (diuretics).  Eating disorders (bulimia).  Low magnesium level.  Sweating a lot. SIGNS AND SYMPTOMS  Weakness.  Constipation.  Fatigue.  Muscle cramps.  Mental confusion.  Skipped heartbeats or irregular heartbeat (palpitations).  Tingling or numbness. DIAGNOSIS  Your health care provider can diagnose hypokalemia with blood tests. In addition to checking your potassium level, your health care provider may also check other lab tests. TREATMENT Hypokalemia can be treated with potassium supplements taken by mouth or adjustments in your current medicines. If your potassium level is very low, you may need to get potassium through a vein (IV) and be monitored in the hospital. A diet high in potassium is also helpful. Foods high in potassium are:  Nuts, such as peanuts and pistachios.  Seeds, such as sunflower seeds and pumpkin seeds.  Peas, lentils, and lima beans.  Whole grain and   bran cereals and breads.  Fresh fruit and vegetables, such as apricots, avocado, bananas, cantaloupe, kiwi, oranges, tomatoes, asparagus, and potatoes.  Orange and tomato juices.  Red meats.  Fruit  yogurt. HOME CARE INSTRUCTIONS  Take all medicines as prescribed by your health care provider.  Maintain a healthy diet by including nutritious food, such as fruits, vegetables, nuts, whole grains, and lean meats.  If you are taking a laxative, be sure to follow the directions on the label. SEEK MEDICAL CARE IF:  Your weakness gets worse.  You feel your heart pounding or racing.  You are vomiting or having diarrhea.  You are diabetic and having trouble keeping your blood glucose in the normal range. SEEK IMMEDIATE MEDICAL CARE IF:  You have chest pain, shortness of breath, or dizziness.  You are vomiting or having diarrhea for more than 2 days.  You faint. MAKE SURE YOU:   Understand these instructions.  Will watch your condition.  Will get help right away if you are not doing well or get worse. Document Released: 06/22/2005 Document Revised: 04/12/2013 Document Reviewed: 12/23/2012 ExitCare Patient Information 2014 [employer].  

## 2013-08-14 NOTE — Progress Notes (Signed)
ID: [name] OB: Dec 22, 1960  MR#: [id number]  FAO#:[id number]  PCP: Harlow Asa, MD ; Sherie Don, NP GYN:   Conley Simmonds, MD  SU: Ovidio Kin, MD RADONC:  Lurline Hare, MD OTHER MD: Regino Schultze, MD;  Estelle Grumbles, MD  CHIEF COMPLAINT:  Left Breast Cancer/neoadjuvant chemotherapy   HISTORY OF PRESENT ILLNESS: [name] [name] palpated a mass in her left breast in late August 2014. She was already scheduled for screening mammography at [facility] and this was performed 03/13/2013. Indeed a possible mass was noted in the left breast and on 03/29/2013 the patient underwent diagnostic left mammography and left ultrasonography. This showed a 2.5 cm irregular mass in the upper outer quadrant of the left breast. This was palpable.by ultrasound there was a 1.6 cm irregular hypoechoic mass in the area in question, and in addition to enlarged left axillary lymph nodes were noted, the largest measuring 2.5 cm.  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  Bilateral breast MRI 04/11/2013 shows 3 abnormal enhancing masses in the upper outer quadrant of the left breast measuring altogether 6.4 cm. The largest separate mass measured 2.7 cm. There were abnormal or large lymph nodes in the left axilla. Ultrasound guided biopsy of the 2 smaller masses has been scheduled.  The patient's subsequent history is as detailed below   INTERVAL HISTORY: [name] [name] returns alone today for followup and assessment of chemotoxicity on day 15 cycle 5 of 6 planned q. three-week neoadjuvant cycles of docetaxel/cyclophosphamide given with trastuzumab/pertuzumab.  Pertuzumab was given  every 3 weeks x4 cycles, but was held 2 weeks ago with cycle 5 secondary to diarrhea.  Unfortunately, she continues to have problems with chronic diarrhea, occurring on a daily basis, as many as 3 or 4 loose stools each morning. She's taking Questran in the morning and by the afternoon, the diarrhea has resolved. She has not been taking anything at night as a preventative. She is trying to drink plenty of fluids. She tells me she "eats when she can". She has a decreased appetite with taste aversion, but fortunately denies any nausea or emesis.   REVIEW OF SYSTEMS: [name] [name]  had a slightly elevated temperature of 100.0 on Saturday. She had a mild sore throat. She took Tylenol, and by Sunday this had completely resolved. She's had no additional fevers of 100 or above, and denies any chills. She's had no additional rashes or skin changes.  She's had no signs of abnormal bruising or bleeding. She's had no change in urinary habits, no dysuria or hematuria. She's had no abnormal headaches. She sometimes feels dizzy when she first stands up, but has had no falls. She's had no change in vision. She denies any cough, increased shortness of breath, orthopnea, peripheral swelling, chest pain, or palpitations. She currently denies any unusual myalgias, arthralgias, or bony pain. She's had some mild intermittent numbness in her fingertips, and notices some sensitivity in her feet on occasion. Both of these are intermittent, and neither are affecting her day-to-day activities.  A detailed review of systems is otherwise noncontributory.   PAST MEDICAL HISTORY: Past Medical History  Diagnosis Date  . Chest pain 2009  Consultation-[facility], negative chest CT; nl echo in 2005; h/o palpitations  . Palpitations   . Degenerative joint disease     + degenerative joint disease of the lumbosacral spine  . Colitis 2010    not IBD  . Herpes simplex type II infection   . Allergic rhinitis   . Hypercholesteremia      "slightly high"  . Anxiety   . Heart murmur     "small"  . GERD (gastroesophageal reflux disease)     "a little"  . Breast cancer 03/31/13    left  . Cancer   . Depression   . Hypertension   . Anemia, unspecified 05/30/2013    PAST SURGICAL HISTORY: Past Surgical History  Procedure Laterality Date  . Colonoscopy  10/2010    proctitis; melanosis coli  . Tubal ligation    . Breast excisional biopsy  04/2003    Left; benign disease  . Right oophorectomy  03/13/2007  . Abdominal hysterectomy  03/13/2007    TAH ?BSO--Dr Cheron Every, [place]  . Eye surgery Left     "fix lazy eye"  . Portacath placement Right 04/21/2013    Procedure: INSERTION PORT-A-CATH;  Surgeon: Kandis Cocking, MD;  Location: [facility];  Service: General;  Laterality: Right;    FAMILY HISTORY Family History  Problem Relation Age of Onset  . Aneurysm Mother     Cerebral  . Hypertension Mother   . Hyperlipidemia Mother   . Stroke Mother   . Coronary artery disease Father   . Hypertension Father   . Hyperlipidemia Father   . Lung cancer Brother   . Cancer Brother     Lung  The patient's father died at the age of 23 from a myocardial infarction. The patient's mother died at the age of 46 from a stroke. The patient had 6 brothers, 2 sisters. One brother has a history of lung cancer in the setting of tobacco abuse. Another brother had multiple myeloma. There is no history of breast or ovarian cancer in the family  GYNECOLOGIC HISTORY:  Menarche age 12, first live birth age 18, the patient is GX P4. She status post abdominal hysterectomy with bilateral salpingo-oophorectomy. She never took hormone replacement.   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 Her husband [name] works in Designer, fashion/clothing. Daughter Candida Peeling lives at home with the patient. She is not employed. Son Kaelie Boisvert lives in [place] and is not employed. Daughter Darl Householder  Dillard lives in [place] and is a Community education officer. Daughter [name] is a Child psychotherapist in [place]. The patient has 10 grandchildren. She attends the [employer]     ADVANCED DIRECTIVES: Not in place   HEALTH MAINTENANCE:  (Updated February 2015)  History  Substance Use Topics  . Smoking status: Former Smoker -- 0.50 packs/day for 15 years    Types: Cigarettes    Quit date: 07/06/1982  . Smokeless tobacco: Never Used  . Alcohol Use: No     Colonoscopy:2012  PAP:2014   Bone density: Never  Lipid panel: Not on file    Allergies  Allergen Reactions  . Doxycycline Other (See Comments)    Unknown    Current Outpatient Prescriptions  Medication Sig Dispense Refill  . ALPRAZolam (XANAX) 1 MG tablet 1 po TID prn anxiety  90 tablet  0  . atenolol (TENORMIN) 50 MG tablet TAKE 1 TABLET BY MOUTH EVERY  DAY FOR PALPITATION  90 tablet  1  . cholestyramine (QUESTRAN) 4 G packet Take 1 packet (4 g total) by mouth 2 (two) times daily as needed (diarrhea).  30 each  1  . citalopram (CELEXA) 40 MG tablet Take 40 mg by mouth every evening.      . diphenhydramine-acetaminophen (TYLENOL PM) 25-500 MG TABS Take 2 tablets by mouth at bedtime.      . lidocaine-prilocaine (EMLA) cream Apply topically as needed.  30 g  0  . LORazepam (ATIVAN) 0.5 MG tablet TAKE 1 TABLET EVERY 6 HOURS AS NEEDED FOR NAUSEA OR VOMITING  30 tablet  0  . valACYclovir (VALTREX) 1000 MG tablet Take 1,000 mg by mouth daily.      . Alum & Mag Hydroxide-Simeth (MAGIC MOUTHWASH) SOLN Take 5 mLs by mouth 4 (four) times daily. X 7 - 10 days  240 mL  1  . clindamycin (CLINDAGEL) 1 % gel Apply topically 2 (two) times daily.  30 g  1  . dexamethasone (DECADRON) 4 MG tablet Take 2 tablets (8 mg total) by mouth 2 (two) times daily with a meal. Take two times a day the day before Taxotere. Then take two times a day starting the day after chemo for 3 days.  30 tablet  1  . ondansetron (ZOFRAN) 8 MG tablet  Take 1 tablet (8 mg total) by mouth 2 (two) times daily. Take two times a day starting the day after chemo for 3 days. Then take two times a day as needed for nausea or vomiting.  30 tablet  1  . potassium chloride (K-DUR) 10 MEQ tablet Take 2 tablets (20 mEq total) by mouth 3 (three) times daily. X 7 days.  Then 2 tabs by mouth twice daily or as directed  180 tablet  1  . prochlorperazine (COMPAZINE) 10 MG tablet Take 1 tablet (10 mg total) by mouth every 6 (six) hours as needed (Nausea or vomiting).  30 tablet  1  . [DISCONTINUED] potassium chloride SA (K-DUR,KLOR-CON) 20 MEQ tablet Take 1 tablet (20 mEq total) by mouth 2 (two) times daily.  20 tablet  1   Current Facility-Administered Medications  Medication Dose Route Frequency Provider Last Rate Last Dose  . sodium chloride 0.9 % 1,000 mL with potassium chloride 20 mEq, magnesium sulfate 1 g infusion   Intravenous Continuous Sandee Bernath Allegra Grana, PA-C 500 mL/hr at 08/14/13 1438      OBJECTIVE: Middle-aged [place] American woman who appears weak but is in no acute distress Filed Vitals:   08/14/13 1329  BP: 103/70  Pulse: 77  Temp: 98.3 F (36.8 C)  Resp: 20     Body mass index is 30.66 kg/(m^2).    ECOG FS:1 Filed Weights   08/14/13 1329  Weight: 157 lb (71.215 kg)   Physical Exam: HEENT:  Sclerae anicteric.  Oropharynx clear, pink, and moist. No ulcerations. No evidence of oropharyngeal candidiasis. No pharyngeal erythema. Neck is supple, trachea midline.  NODES:  No cervical or supraclavicular lymphadenopathy palpated.  BREAST EXAM:  Deferred. Axillae are benign bilaterally, no palpable lymphadenopathy. LUNGS:  Clear to auscultation bilaterally.  No wheezes or rhonchi HEART:  Regular rate and rhythm.  ABDOMEN:  Soft, nontender.  Positive, normoactive bowel sounds.  MSK:  No focal spinal tenderness to palpation. Good range of motion bilaterally in the upper extremities. No joint swelling. EXTREMITIES:  No peripheral edema.   SKIN:   Benign with no visible rashes or skin lesions. There is some  hyperpigmentation noted on the hands, and the nailbeds bilaterally. No drainage or evidence of infection. Port is intact in the right chest wall, with no erythema, edema, or evidence of infection/cellulitis. NEURO:  Nonfocal. Well oriented.  Appropriate affect.    LAB RESULTS:   Lab Results  Component Value Date   WBC 11.9* 08/14/2013   NEUTROABS 9.3* 08/14/2013   HGB 11.0* 08/14/2013   HCT 32.5* 08/14/2013   MCV 87.4 08/14/2013   PLT 53* 08/14/2013      Chemistry      Component Value Date/Time   NA 144 08/14/2013 1310   NA 139 06/06/2008 0545   K 2.7* 08/14/2013 1310   K 3.8 06/06/2008 0545   CL 104 06/06/2008 0545   CO2 29 08/14/2013 1310   CO2 26 06/06/2008 0545   BUN 6.0* 08/14/2013 1310   BUN 6 06/06/2008 0545   CREATININE 0.8 08/14/2013 1310   CREATININE 0.77 06/06/2008 0545      Component Value Date/Time   CALCIUM 9.3 08/14/2013 1310   CALCIUM 8.7 06/06/2008 0545   ALKPHOS 93 08/14/2013 1310   ALKPHOS 62 04/12/2008 1725   AST 20 08/14/2013 1310   AST 21 04/12/2008 1725   ALT 35 08/14/2013 1310   ALT 28 04/12/2008 1725   BILITOT 0.47 08/14/2013 1310   BILITOT 0.5 04/12/2008 1725     Magnesium  1.4  08/14/2013    STUDIES:     Echocardiogram on 04/20/2013 showed an ejection fraction of 60-65%.  This is due to be repeated in late January, and it has been ordered but not yet scheduled as of 07/10/2013.    ASSESSMENT/PLAN: 53 y.o. [name] [name], [place] woman   (1)  status post left breast biopsy 03/29/2013 for a clinical T2-T3 pN1, stage IIB-IIIA invasive ductal carcinoma, grade 2-3, estrogen receptor 39% positive (with moderate staining intensity), progesterone receptor negative, with an MIB-1 of 83%, and HER-2 amplified by CISH, with a ratio of 7.5 and 15 HER-2 copies per cell  (2) receiving neoadjuvant chemotherapy, with trastuzumab/ pertuzumab/ carboplatin/docetaxel started 05/01/2013, to be repeated every 3 weeks x6. Pertuzumab  has been  held after 4 cycles of secondary to diarrhea. The patient receives Neulasta on day 2 at [facility] per her request. Echocardiogram to be repeated on 08/15/2013.  Trastuzumab to be continued for total of one year.   (3) Diarrhea most likely secondary to chemotherapy (Pertuzumab)   (4) skin rash most likely secondary to pertuzumab: Resolved.   (5) Dehydration secondary to  diarrhea, with associated hypokalemia and hypomagnesemia   (6) chemotherapy-induced anemia, mildly symptomatic   PLAN: This case was reviewed with Dr. Darnelle Catalan. We are going to get the patient supportive IV fluids today (1000 mL of normal saline) in addition to 20 mEq of potassium and 1 g of magnesium IV. We will try to avoid oral magnesium and attempt to prevent additional diarrhea. She will, however, start on oral potassium supplementation, 20 mEq 3 times daily x7 days, then 20 mEq twice daily or as directed, depending on her labs. We will repeat her labs again later this week on 08/17/2013. She'll then return next Monday, 06/20/2014 for repeat labs and physical exam prior to her sixth and final dose of neoadjuvant chemotherapy. We will continue to hold the pertuzumab as with cycle 5.   [name] [name] is already scheduled for a repeat echocardiogram tomorrow, February 10. She is also scheduled for her final breast MRI on February 23 after completing neoadjuvant chemotherapy, and will see her surgeon  again on February 25 2 make final decisions regarding her surgery.   The patient voices understanding and agreement with the above plan, and will call with any changes or problems prior to her next appointment. Of note, I have encouraged her to try taking the Questran twice daily, both at night and in the morning, to see if this helps improve the diarrhea. Also encouraged her to keep herself very well hydrated. We will try to be proactive and will go ahead and schedule her for supportive IV fluids on day 9 of the next cycle when she  scheduled to see me again on February 24.    [name] Mato, PA-C     08/14/2013 4:19 PM

## 2013-08-15 ENCOUNTER — Ambulatory Visit (HOSPITAL_BASED_OUTPATIENT_CLINIC_OR_DEPARTMENT_OTHER)
Admission: RE | Admit: 2013-08-15 | Discharge: 2013-08-15 | Disposition: A | Discharge status: Home / Self Care | Payer: BC Managed Care – PPO | Source: Ambulatory Visit | Attending: Internal Medicine | Admitting: Internal Medicine

## 2013-08-15 ENCOUNTER — Telehealth: Payer: Self-pay | Admitting: *Deleted

## 2013-08-15 ENCOUNTER — Ambulatory Visit (HOSPITAL_COMMUNITY)
Admission: RE | Admit: 2013-08-15 | Discharge: 2013-08-15 | Disposition: A | Discharge status: Home / Self Care | Payer: BC Managed Care – PPO | Source: Ambulatory Visit | Attending: Family Medicine | Admitting: Family Medicine

## 2013-08-15 ENCOUNTER — Encounter (HOSPITAL_COMMUNITY): Payer: Self-pay

## 2013-08-15 VITALS — BP 98/62 | HR 71 | Ht 60.0 in | Wt 157.8 lb

## 2013-08-15 DIAGNOSIS — R011 Cardiac murmur, unspecified: Secondary | ICD-10-CM | POA: Insufficient documentation

## 2013-08-15 DIAGNOSIS — C50412 Malignant neoplasm of upper-outer quadrant of left female breast: Secondary | ICD-10-CM

## 2013-08-15 DIAGNOSIS — C50919 Malignant neoplasm of unspecified site of unspecified female breast: Secondary | ICD-10-CM

## 2013-08-15 DIAGNOSIS — I10 Essential (primary) hypertension: Secondary | ICD-10-CM | POA: Insufficient documentation

## 2013-08-15 DIAGNOSIS — R079 Chest pain, unspecified: Secondary | ICD-10-CM | POA: Insufficient documentation

## 2013-08-15 DIAGNOSIS — C50419 Malignant neoplasm of upper-outer quadrant of unspecified female breast: Secondary | ICD-10-CM

## 2013-08-15 DIAGNOSIS — R002 Palpitations: Secondary | ICD-10-CM | POA: Insufficient documentation

## 2013-08-15 NOTE — Telephone Encounter (Signed)
sw pt gv appt for labson 2?12/15 @ 10:15am. i also informed the pt that i emailed MW to add IV on 08/29/13. Pt is aware that i lm w/ Mazie to schedule her inj @ [facility]...td

## 2013-08-15 NOTE — Progress Notes (Signed)
Patient ID: [name] [name], female   DOB: 07/07/1960, 53 y.o.   MRN: [id number] Referring MD: Dr Jana Hakim PCP: Rubbie Battiest, MD ; Pearson Forster, NP  GYN: Josefa Half, MD  SU: Alphonsa Overall, MD  [place]: Thea Silversmith, MD Cardiologist: Dr Lattie Haw.   HPI: [name] [name] is a 53 year old with history of L breast cancer diagnosed 03/2013 T2-T3 pN1, stage IIB-IIIA invasive ductal carcinoma, grade 2-3, estrogen receptor 39% positive (with moderate staining intensity), progesterone receptor negative, with an MIB-1 of 83%, and HER-2 amplified by CISH, HTN, Depression, herpes simplex II, DJD, palpitations --on atenolol, referred to the cardio-onc clinic by Dr Jana Hakim due to Herceptin.   Started on trastuzumab/ pertuzumab/ carboplatin/docetaxel on 05/01/13. Pertuzumab stopped due to rash.   Next week will finish chemo and continue with Herceptin. Has struggled with several bouts of dehydration and diarrhea. Breathing ok. Now swelling or CP. Palpitations ok on atenolol.  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 Lives with her husband and daughter. Former smoker quit 15 years ago  FH: Mother: cerebral aneurysm, HTN, Hyperlipidemia, CVA,       Father:  Deceased MI at age 44 and died. CAD, HTN, Hyper lipidemia,        Brother: Lung Cancer   PHYSICAL EXAM: Filed Vitals:   08/15/13 1154  BP: 98/62  Pulse: 71   General:  Well appearing. No respiratory difficulty HEENT: normal Neck: supple. no JVD. Carotids 2+ bilat; no bruits. No lymphadenopathy or thryomegaly appreciated. Cor: PMI nondisplaced. Regular rate & rhythm. No rubs, gallops or murmurs. Lungs: clear Abdomen: soft, nontender, nondistended. No hepatosplenomegaly. No bruits or masses. Good bowel sounds. Extremities: no cyanosis, clubbing, rash, edema Neuro: alert & oriented x 3, cranial nerves grossly  intact. moves all 4 extremities w/o difficulty. Affect pleasant. Skin : Rash noted on trunk      Results for orders placed in visit on 08/14/13 (from the past 24 hour(s))  MAGNESIUM (CC13)     Status: Abnormal   Collection Time    08/14/13  1:10 PM      Result Value Range   Magnesium 1.4 (*) 1.5 - 2.5 mg/dl   Narrative:    Performed At:  [facility]               [address]               [place], [place] [address]  COMPREHENSIVE METABOLIC PANEL (GU44)     Status: Abnormal   Collection Time    08/14/13  1:10 PM      Result Value Range   Sodium 144  136 - 145 mEq/L   Potassium 2.7 (*) 3.5 - 5.1 mEq/L   Chloride 104  98 - 109 mEq/L   CO2 29  22 - 29 mEq/L   Glucose 97  70 - 140 mg/dl   BUN 6.0 (*) 7.0 - 26.0 mg/dL   Creatinine 0.8  0.6 - 1.1 mg/dL   Total Bilirubin 0.47  0.20 - 1.20 mg/dL   Alkaline Phosphatase 93  40 - 150 U/L   AST 20  5 - 34 U/L   ALT 35  0 - 55 U/L   Total Protein 6.8  6.4 - 8.3 g/dL   Albumin 3.8  3.5 - 5.0 g/dL   Calcium 9.3  8.4 - 10.4 mg/dL   Anion Gap 10  3 - 11 mEq/L   Narrative:    Performed At:  [facility].               [place], [place] [address]  CBC WITH DIFFERENTIAL     Status: Abnormal   Collection Time    08/14/13  1:10 PM      Result Value Range   WBC 11.9 (*) 3.9 - 10.3 10e3/uL   NEUT# 9.3 (*) 1.5 - 6.5 10e3/uL   HGB 11.0 (*) 11.6 - 15.9 g/dL   HCT 32.5 (*) 34.8 - 46.6 %   Platelets 53 (*) 145 - 400 10e3/uL   MCV 87.4  79.5 - 101.0 fL   MCH 29.6  25.1 - 34.0 pg   MCHC 33.8  31.5 - 36.0 g/dL   RBC 3.72  3.70 - 5.45 10e6/uL   RDW 15.8 (*) 11.2 - 14.5 %   lymph# 1.8  0.9 - 3.3 10e3/uL   MONO# 0.9  0.1 - 0.9 10e3/uL   Eosinophils Absolute 0.0  0.0 - 0.5 10e3/uL   Basophils Absolute 0.0  0.0 - 0.1 10e3/uL   NEUT% 77.8 (*) 38.4 - 76.8 %   LYMPH% 14.9  14.0 - 49.7 %   MONO% 7.2  0.0 - 14.0 %   EOS% 0.0  0.0 - 7.0 %   BASO% 0.1  0.0 - 2.0 %   Narrative:    Performed At:  [facility].               [place],  [id number]   No results found.   ASSESSMENT & PLAN:  1. L Breast Cancer    -I reviewed echos personally. EF and Doppler parameters stable. No HF on exam. Continue Herceptin.    2. Palpitations- quiescent on atenolol  Genevive Bi 08/15/2013

## 2013-08-15 NOTE — Telephone Encounter (Signed)
Per staff message and POF I have scheduled appts.  JMW  

## 2013-08-15 NOTE — Patient Instructions (Signed)
We will contact you in 3 months to schedule your next appointment and echocardiogram  

## 2013-08-15 NOTE — Addendum Note (Signed)
Encounter addended by: Scarlette Calico, RN on: 08/15/2013 12:36 PM<BR>     Documentation filed: Patient Instructions Section

## 2013-08-15 NOTE — Progress Notes (Signed)
  Echocardiogram 2D Echocardiogram has been performed.  Donata Clay 08/15/2013, 11:57 AM

## 2013-08-16 ENCOUNTER — Telehealth: Payer: Self-pay | Admitting: *Deleted

## 2013-08-16 NOTE — Telephone Encounter (Signed)
sw pt gv appt for inj @ [facility] for 08/22/13@ 3:30pm. Pt is aware...td

## 2013-08-17 ENCOUNTER — Other Ambulatory Visit (HOSPITAL_BASED_OUTPATIENT_CLINIC_OR_DEPARTMENT_OTHER): Payer: BC Managed Care – PPO

## 2013-08-17 ENCOUNTER — Other Ambulatory Visit: Payer: Self-pay | Admitting: *Deleted

## 2013-08-17 DIAGNOSIS — E876 Hypokalemia: Secondary | ICD-10-CM

## 2013-08-17 DIAGNOSIS — C50412 Malignant neoplasm of upper-outer quadrant of left female breast: Secondary | ICD-10-CM

## 2013-08-17 DIAGNOSIS — C50419 Malignant neoplasm of upper-outer quadrant of unspecified female breast: Secondary | ICD-10-CM

## 2013-08-17 DIAGNOSIS — R197 Diarrhea, unspecified: Secondary | ICD-10-CM

## 2013-08-17 DIAGNOSIS — D696 Thrombocytopenia, unspecified: Secondary | ICD-10-CM

## 2013-08-17 LAB — CBC WITH DIFFERENTIAL/PLATELET
BASO%: 0.3 % (ref 0.0–2.0)
Basophils Absolute: 0 10*3/uL (ref 0.0–0.1)
EOS%: 0 % (ref 0.0–7.0)
Eosinophils Absolute: 0 10*3/uL (ref 0.0–0.5)
HCT: 31.2 % — ABNORMAL LOW (ref 34.8–46.6)
HGB: 10.6 g/dL — ABNORMAL LOW (ref 11.6–15.9)
LYMPH%: 18.7 % (ref 14.0–49.7)
MCH: 30.6 pg (ref 25.1–34.0)
MCHC: 33.9 g/dL (ref 31.5–36.0)
MCV: 90.3 fL (ref 79.5–101.0)
MONO#: 0.7 10*3/uL (ref 0.1–0.9)
MONO%: 8.5 % (ref 0.0–14.0)
NEUT#: 6.4 10*3/uL (ref 1.5–6.5)
NEUT%: 72.5 % (ref 38.4–76.8)
Platelets: 69 10*3/uL — ABNORMAL LOW (ref 145–400)
RBC: 3.45 10*6/uL — ABNORMAL LOW (ref 3.70–5.45)
RDW: 17.2 % — ABNORMAL HIGH (ref 11.2–14.5)
WBC: 8.8 10*3/uL (ref 3.9–10.3)
lymph#: 1.6 10*3/uL (ref 0.9–3.3)

## 2013-08-17 LAB — COMPREHENSIVE METABOLIC PANEL (CC13)
ALT: 45 U/L (ref 0–55)
AST: 31 U/L (ref 5–34)
Albumin: 3.7 g/dL (ref 3.5–5.0)
Alkaline Phosphatase: 75 U/L (ref 40–150)
Anion Gap: 9 mEq/L (ref 3–11)
BUN: 4.5 mg/dL — ABNORMAL LOW (ref 7.0–26.0)
CO2: 27 mEq/L (ref 22–29)
Calcium: 9.1 mg/dL (ref 8.4–10.4)
Chloride: 105 mEq/L (ref 98–109)
Creatinine: 0.8 mg/dL (ref 0.6–1.1)
Glucose: 84 mg/dl (ref 70–140)
Potassium: 3.1 mEq/L — ABNORMAL LOW (ref 3.5–5.1)
Sodium: 142 mEq/L (ref 136–145)
Total Bilirubin: 0.41 mg/dL (ref 0.20–1.20)
Total Protein: 6.5 g/dL (ref 6.4–8.3)

## 2013-08-17 LAB — MAGNESIUM (CC13): Magnesium: 1.1 mg/dl — CL (ref 1.5–2.5)

## 2013-08-17 MED ORDER — POTASSIUM CHLORIDE ER 10 MEQ PO TBCR: 20.0000 meq | EXTENDED_RELEASE_TABLET | Freq: Three times a day (TID) | ORAL | Status: DC

## 2013-08-17 NOTE — Telephone Encounter (Signed)
Lab counts noted, patient is to double up on Klor-con, so 2 tabs, bid x 3 days. Refill sent to pharmacy. Patient is to be taking Questran bid round the clock and should add Immodium OTC in the morning and after lunch for stools 3-4times as day as reported. Low potassium and magnesium side effects discussed and informed patient to call if needed, also sent food tips to patient via [place].

## 2013-08-21 ENCOUNTER — Ambulatory Visit (HOSPITAL_BASED_OUTPATIENT_CLINIC_OR_DEPARTMENT_OTHER): Payer: BC Managed Care – PPO

## 2013-08-21 ENCOUNTER — Ambulatory Visit (HOSPITAL_BASED_OUTPATIENT_CLINIC_OR_DEPARTMENT_OTHER): Payer: BC Managed Care – PPO | Admitting: Hematology and Oncology

## 2013-08-21 ENCOUNTER — Encounter: Payer: Self-pay | Admitting: Oncology

## 2013-08-21 VITALS — BP 98/65 | HR 87 | Temp 98.2°F | Resp 18 | Ht 60.0 in | Wt 158.6 lb

## 2013-08-21 DIAGNOSIS — R197 Diarrhea, unspecified: Secondary | ICD-10-CM

## 2013-08-21 DIAGNOSIS — Z5111 Encounter for antineoplastic chemotherapy: Secondary | ICD-10-CM

## 2013-08-21 DIAGNOSIS — C50412 Malignant neoplasm of upper-outer quadrant of left female breast: Secondary | ICD-10-CM

## 2013-08-21 DIAGNOSIS — T451X5A Adverse effect of antineoplastic and immunosuppressive drugs, initial encounter: Secondary | ICD-10-CM

## 2013-08-21 DIAGNOSIS — C50419 Malignant neoplasm of upper-outer quadrant of unspecified female breast: Secondary | ICD-10-CM

## 2013-08-21 DIAGNOSIS — Z170 Estrogen receptor positive status [ER+]: Secondary | ICD-10-CM

## 2013-08-21 DIAGNOSIS — Z5112 Encounter for antineoplastic immunotherapy: Secondary | ICD-10-CM

## 2013-08-21 DIAGNOSIS — D6481 Anemia due to antineoplastic chemotherapy: Secondary | ICD-10-CM

## 2013-08-21 LAB — CBC WITH DIFFERENTIAL/PLATELET
BASO%: 0.3 % (ref 0.0–2.0)
Basophils Absolute: 0 10*3/uL (ref 0.0–0.1)
EOS%: 0 % (ref 0.0–7.0)
Eosinophils Absolute: 0 10*3/uL (ref 0.0–0.5)
HCT: 28.6 % — ABNORMAL LOW (ref 34.8–46.6)
HGB: 9.7 g/dL — ABNORMAL LOW (ref 11.6–15.9)
LYMPH%: 29.6 % (ref 14.0–49.7)
MCH: 30.3 pg (ref 25.1–34.0)
MCHC: 33.9 g/dL (ref 31.5–36.0)
MCV: 89.4 fL (ref 79.5–101.0)
MONO#: 0.8 10*3/uL (ref 0.1–0.9)
MONO%: 13 % (ref 0.0–14.0)
NEUT#: 3.5 10*3/uL (ref 1.5–6.5)
NEUT%: 57.1 % (ref 38.4–76.8)
Platelets: 158 10*3/uL (ref 145–400)
RBC: 3.2 10*6/uL — ABNORMAL LOW (ref 3.70–5.45)
RDW: 16.8 % — ABNORMAL HIGH (ref 11.2–14.5)
WBC: 6.2 10*3/uL (ref 3.9–10.3)
lymph#: 1.8 10*3/uL (ref 0.9–3.3)
nRBC: 0 % (ref 0–0)

## 2013-08-21 LAB — COMPREHENSIVE METABOLIC PANEL (CC13)
ALT: 46 U/L (ref 0–55)
AST: 29 U/L (ref 5–34)
Albumin: 3.5 g/dL (ref 3.5–5.0)
Alkaline Phosphatase: 62 U/L (ref 40–150)
Anion Gap: 8 mEq/L (ref 3–11)
BUN: 5.4 mg/dL — ABNORMAL LOW (ref 7.0–26.0)
CO2: 28 mEq/L (ref 22–29)
Calcium: 9.2 mg/dL (ref 8.4–10.4)
Chloride: 109 mEq/L (ref 98–109)
Creatinine: 0.7 mg/dL (ref 0.6–1.1)
Glucose: 82 mg/dl (ref 70–140)
Potassium: 3.7 mEq/L (ref 3.5–5.1)
Sodium: 144 mEq/L (ref 136–145)
Total Bilirubin: 0.35 mg/dL (ref 0.20–1.20)
Total Protein: 5.9 g/dL — ABNORMAL LOW (ref 6.4–8.3)

## 2013-08-21 MED ORDER — SODIUM CHLORIDE 0.9 % IV SOLN
Freq: Once | INTRAVENOUS | Status: AC
  Administered 2013-08-21: 10:00:00 via INTRAVENOUS

## 2013-08-21 MED ORDER — DEXAMETHASONE SODIUM PHOSPHATE 20 MG/5ML IJ SOLN
INTRAMUSCULAR | Status: AC
  Filled 2013-08-21: qty 5

## 2013-08-21 MED ORDER — DIPHENHYDRAMINE HCL 25 MG PO CAPS
25.0000 mg | ORAL_CAPSULE | Freq: Once | ORAL | Status: AC
  Administered 2013-08-21: 25 mg via ORAL

## 2013-08-21 MED ORDER — ONDANSETRON 16 MG/50ML IVPB (CHCC)
16.0000 mg | Freq: Once | INTRAVENOUS | Status: AC
  Administered 2013-08-21: 16 mg via INTRAVENOUS

## 2013-08-21 MED ORDER — TRASTUZUMAB CHEMO INJECTION 440 MG
6.0000 mg/kg | Freq: Once | INTRAVENOUS | Status: AC
  Administered 2013-08-21: 462 mg via INTRAVENOUS
  Filled 2013-08-21: qty 22

## 2013-08-21 MED ORDER — SODIUM CHLORIDE 0.9 % IJ SOLN
10.0000 mL | INTRAMUSCULAR | Status: DC | PRN
  Administered 2013-08-21: 10 mL
  Filled 2013-08-21: qty 10

## 2013-08-21 MED ORDER — SODIUM CHLORIDE 0.9 % IV SOLN
75.0000 mg/m2 | Freq: Once | INTRAVENOUS | Status: AC
  Administered 2013-08-21: 130 mg via INTRAVENOUS
  Filled 2013-08-21: qty 13

## 2013-08-21 MED ORDER — ONDANSETRON 16 MG/50ML IVPB (CHCC)
INTRAVENOUS | Status: AC
  Filled 2013-08-21: qty 16

## 2013-08-21 MED ORDER — DIPHENHYDRAMINE HCL 25 MG PO CAPS
ORAL_CAPSULE | ORAL | Status: AC
  Filled 2013-08-21: qty 1

## 2013-08-21 MED ORDER — HEPARIN SOD (PORK) LOCK FLUSH 100 UNIT/ML IV SOLN
500.0000 [IU] | Freq: Once | INTRAVENOUS | Status: AC | PRN
  Administered 2013-08-21: 500 [IU]
  Filled 2013-08-21: qty 5

## 2013-08-21 MED ORDER — SODIUM CHLORIDE 0.9 % IV SOLN
732.6000 mg | Freq: Once | INTRAVENOUS | Status: AC
  Administered 2013-08-21: 730 mg via INTRAVENOUS
  Filled 2013-08-21: qty 73

## 2013-08-21 MED ORDER — DEXAMETHASONE SODIUM PHOSPHATE 20 MG/5ML IJ SOLN
20.0000 mg | Freq: Once | INTRAMUSCULAR | Status: AC
  Administered 2013-08-21: 20 mg via INTRAVENOUS

## 2013-08-21 MED ORDER — ACETAMINOPHEN 325 MG PO TABS
650.0000 mg | ORAL_TABLET | Freq: Once | ORAL | Status: AC
Start: 2013-08-21 — End: 2013-08-21
  Administered 2013-08-21: 650 mg via ORAL

## 2013-08-21 MED ORDER — ACETAMINOPHEN 325 MG PO TABS
ORAL_TABLET | ORAL | Status: AC
  Filled 2013-08-21: qty 2

## 2013-08-21 NOTE — Progress Notes (Signed)
ID: [name] OB: 1960/12/14  MR#: [id number]  CSN#:[id number]  PCP: Rubbie Battiest, MD ; Pearson Forster, NP GYN:   Josefa Half, MD  SU: Alphonsa Overall, MD Biehle:  Thea Silversmith, MD OTHER MD: Freida Busman, MD;  Neysa Bonito, MD  CHIEF COMPLAINT: For initiation of neoadjuvant [facility] chemotherapy -cycle #6    DIAGNOSIS: Left Breast Cancer   HISTORY OF PRESENT ILLNESS: [name] [name] palpated a mass in her left breast in late August 2014. She was already scheduled for screening mammography at [facility] and this was performed 03/13/2013. Indeed a possible mass was noted in the left breast and on 03/29/2013 the patient underwent diagnostic left mammography and left ultrasonography. This showed a 2.5 cm irregular mass in the upper outer quadrant of the left breast. This was palpable.by ultrasound there was a 1.6 cm irregular hypoechoic mass in the area in question, and in addition to enlarged left axillary lymph nodes were noted, the largest measuring 2.5 cm.  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  Bilateral breast MRI 04/11/2013 shows 3 abnormal enhancing masses in the upper outer quadrant of the left breast measuring altogether 6.4 cm. The largest separate mass measured 2.7 cm. There were abnormal or large lymph nodes in the left axilla. Ultrasound guided biopsy of the 2 smaller masses has been scheduled.  The patient's subsequent history is as detailed below   INTERVAL HISTORY: [name] [name] returns today with her husband for initiation of cycle #6  neoadjuvant chemotherapy with TCH. pertuzumab was held in view of  skin rash and diarrhea. Her diarrhea is  much improved on questran and Imodium. Unfortunately she had to be on oral potassium supplementation for  hypokalemia that may be partly contributing to her diarrhea. At present she complains of fatigue.Denies any nausea or vomiting. She c/o  tingling and numbness in tips of fingers. She is on oral KCl 2 tablets 3 times daily.  She had echocardiogram done on 08/15/2013 that revealed ejection fraction of 55-60%. She denies any fever, chest pain, palpitations, headache, blurred vision, dizziness, blood in the stool blood in the urine. Complains of taste changes. She says that she's not able to drink enough fluids.  REVIEW OF SYSTEMS: A 14 pont  review of systems is been assessed and pertinent symptoms are mentioned in interval history  PAST MEDICAL HISTORY: Past Medical History  Diagnosis Date  . Chest pain 2009    Consultation-[facility], negative chest CT; nl echo in 2005; h/o palpitations  . Palpitations   . Degenerative joint disease     + degenerative joint disease of the lumbosacral spine  . Colitis 2010    not IBD  . Herpes simplex type II infection   . Allergic rhinitis   . Hypercholesteremia     "slightly high"  . Anxiety   . Heart murmur     "small"  . GERD (gastroesophageal reflux disease)     "a little"  . Breast cancer 03/31/13    left  . Cancer   . Depression   . Hypertension   . Anemia, unspecified 05/30/2013    PAST SURGICAL HISTORY: Past Surgical History  Procedure Laterality Date  .  Colonoscopy  10/2010    proctitis; melanosis coli  . Tubal ligation    . Breast excisional biopsy  04/2003    Left; benign disease  . Right oophorectomy  03/13/2007  . Abdominal hysterectomy  03/13/2007    TAH ?BSO--Dr Levin Bacon, [place]  . Eye surgery Left     "fix lazy eye"  . Portacath placement Right 04/21/2013    Procedure: INSERTION PORT-A-CATH;  Surgeon: [name], MD;  Location: [facility];  Service: General;  Laterality: Right;    FAMILY HISTORY Family History   Problem Relation Age of Onset  . Aneurysm Mother     Cerebral  . Hypertension Mother   . Hyperlipidemia Mother   . Stroke Mother   . Coronary artery disease Father   . Hypertension Father   . Hyperlipidemia Father   . Lung cancer Brother   . Cancer Brother     Lung  The patient's father died at the age of 62 from a myocardial infarction. The patient's mother died at the age of 74 from a stroke. The patient had 6 brothers, 2 sisters. One brother has a history of lung cancer in the setting of tobacco abuse. Another brother had multiple myeloma. There is no history of breast or ovarian cancer in the family  GYNECOLOGIC HISTORY:  Menarche age 36, first live birth age 89, the patient is [place] P4. She status post abdominal hysterectomy with bilateral salpingo-oophorectomy. She never took hormone replacement.   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 Her husband [name] works in Charity fundraiser. Daughter [name] lives at home with the patient. She is not employed. Son Orelia Brandstetter lives in [place] and is not employed. Daughter Maggie Schwalbe Dillard lives in [place] and is a Barrister's clerk. Daughter Ernst Spell is a Education officer, museum in [place]. The patient has 10 grandchildren. She attends [employer]     ADVANCED DIRECTIVES: Not in place   HEALTH MAINTENANCE:  (Updated February 2015)  History  Substance Use Topics  . Smoking status: Former Smoker -- 0.50 packs/day for 15 years    Types: Cigarettes    Quit date: 07/06/1982  . Smokeless tobacco: Never Used  . Alcohol Use: No     Colonoscopy:2012  PAP:2014   Bone density: Never  Lipid panel: Not on file    Allergies  Allergen Reactions  . Doxycycline Other (See Comments)    Unknown    Current Outpatient Prescriptions  Medication Sig Dispense Refill  . ALPRAZolam (XANAX) 1 MG tablet 1 po TID prn anxiety  90 tablet  0  .  atenolol (TENORMIN) 50 MG tablet TAKE 1 TABLET BY MOUTH EVERY DAY FOR PALPITATION  90 tablet  1  . cholestyramine (QUESTRAN) 4 G packet Take 1 packet (4 g total) by mouth 2 (two) times daily as needed (diarrhea).  30 each  1  . citalopram (CELEXA) 40 MG tablet Take 40 mg by mouth every evening.      . clindamycin (CLINDAGEL) 1 % gel Apply topically 2 (two) times daily.  30 g  1  . dexamethasone (DECADRON) 4 MG tablet Take 2 tablets (8 mg total) by mouth 2 (two) times daily with a meal. Take two times a day the day before Taxotere. Then take two times a day starting the day after chemo for 3 days.  30 tablet  1  . diphenhydramine-acetaminophen (TYLENOL PM) 25-500 MG  TABS Take 2 tablets by mouth at bedtime.      . lidocaine-prilocaine (EMLA) cream Apply topically as needed.  30 g  0  . loperamide (IMODIUM) 2 MG capsule Take 2 mg by mouth as needed for diarrhea or loose stools.      Marland Kitchen LORazepam (ATIVAN) 0.5 MG tablet TAKE 1 TABLET EVERY 6 HOURS AS NEEDED FOR NAUSEA OR VOMITING  30 tablet  0  . ondansetron (ZOFRAN) 8 MG tablet Take 1 tablet (8 mg total) by mouth 2 (two) times daily. Take two times a day starting the day after chemo for 3 days. Then take two times a day as needed for nausea or vomiting.  30 tablet  1  . potassium chloride (K-DUR) 10 MEQ tablet Take 20 mEq by mouth 3 (three) times daily. X7 days and then 66mq twice a day after      . prochlorperazine (COMPAZINE) 10 MG tablet Take 1 tablet (10 mg total) by mouth every 6 (six) hours as needed (Nausea or vomiting).  30 tablet  1  . valACYclovir (VALTREX) 1000 MG tablet Take 1,000 mg by mouth daily.      .Marland Kitchennystatin (MYCOSTATIN) 100000 UNIT/ML suspension       . potassium chloride (K-DUR) 10 MEQ tablet Take 2 tablets (20 mEq total) by mouth 3 (three) times daily. X 7 days.  Then 2 tabs by mouth twice daily or as directed  180 tablet  1  . [DISCONTINUED] potassium chloride SA (K-DUR,KLOR-CON) 20 MEQ tablet Take 1 tablet (20 mEq total) by mouth 2  (two) times daily.  20 tablet  1   No current facility-administered medications for this visit.    OBJECTIVE: Middle-aged [place] woman who appears weak but is in no acute distress Filed Vitals:   08/21/13 0909  BP: 98/65  Pulse: 87  Temp: 98.2 F (36.8 C)  Resp: 18     Body mass index is 30.97 kg/(m^2).    ECOG FS:1 Filed Weights   08/21/13 0909  Weight: 158 lb 9.6 oz (71.94 kg)   Physical Exam: HEENT:  Sclerae anicteric.  Oropharynx clear, pink, and moist. No ulcerations. No evidence of oropharyngeal candidiasis. No pharyngeal erythema. Neck is supple, trachea midline.  NODES:  No cervical or supraclavicular lymphadenopathy palpated.  BREAST EXAM:  Bilateral breast exam-no masses felt,  Axillae are benign bilaterally, no palpable lymphadenopathy. LUNGS:  Clear to auscultation bilaterally.  No wheezes or rhonchi HEART:  Regular rate and rhythm.  ABDOMEN:  Soft, nontender.  Positive, normoactive bowel sounds.  MSK:  No focal spinal tenderness to palpation. Good range of motion bilaterally in the upper extremities. No joint swelling. EXTREMITIES:  No peripheral edema.   SKIN:  Benign with no visible rashes or skin lesions. There is some hyperpigmentation noted on the hands, and the nailbeds bilaterally. No drainage or evidence of infection. Port is intact in the right chest wall, with no erythema, edema, or evidence of infection/cellulitis. NEURO:  Nonfocal. Well oriented.  Appropriate affect.    LAB RESULTS:   Lab Results  Component Value Date   WBC 6.2 08/21/2013   NEUTROABS 3.5 08/21/2013   HGB 9.7* 08/21/2013   HCT 28.6* 08/21/2013   MCV 89.4 08/21/2013   PLT 158 08/21/2013      Chemistry      Component Value Date/Time   NA 142 08/17/2013 1013   NA 139 06/06/2008 0545   K 3.1* 08/17/2013 1013   K 3.8 06/06/2008 0545   CL 104  06/06/2008 0545   CO2 27 08/17/2013 1013   CO2 26 06/06/2008 0545   BUN 4.5* 08/17/2013 1013   BUN 6 06/06/2008 0545   CREATININE 0.8  08/17/2013 1013   CREATININE 0.77 06/06/2008 0545      Component Value Date/Time   CALCIUM 9.1 08/17/2013 1013   CALCIUM 8.7 06/06/2008 0545   ALKPHOS 75 08/17/2013 1013   ALKPHOS 62 04/12/2008 1725   AST 31 08/17/2013 1013   AST 21 04/12/2008 1725   ALT 45 08/17/2013 1013   ALT 28 04/12/2008 1725   BILITOT 0.41 08/17/2013 1013   BILITOT 0.5 04/12/2008 1725     Magnesium  1.4  08/14/2013    STUDIES:     Echocardiogram on 04/20/2013 showed an ejection fraction of 60-65%.  This is due to be repeated in late January, and it has been ordered but not yet scheduled as of 07/10/2013.    ASSESSMENT/PLAN: 53 y.o. [place], [place] woman   (1)  status post left breast biopsy 03/29/2013 for a clinical T2-T3 pN1, stage IIB-IIIA invasive ductal carcinoma, grade 2-3, estrogen receptor 39% positive (with moderate staining intensity), progesterone receptor negative, with an MIB-1 of 83%, and HER-2 amplified by CISH, with a ratio of 7.5 and 15 HER-2 copies per cell  (2) receiving neoadjuvant chemotherapy, with trastuzumab/ pertuzumab/ carboplatin/docetaxel started 05/01/2013, to be repeated every 3 weeks x6. Pertuzumab  has been held after 4 cycles of secondary to diarrhea. The patient receives Neulasta on day 2 at [facility] per her request. Echocardiogram to be repeated on 08/15/2013.  Trastuzumab to be continued for total of one year.   (3) Diarrhea most likely secondary to chemotherapy (Pertuzumab)/PO KCL: Improved   (4) skin rash most likely secondary to pertuzumab: Resolved.   (5) chemotherapy-induced anemia: Iron Indices are normal. If she gets symptomatic/ hemoglobin less than 8 g/dL we'll arrange for blood transfusion  (6)She had echocardiogram done on 08/15/2013 that revealed ejection fraction of 55-60%.   PLAN: We'll continue sixth of cycle of [place] chemotherapy today. Labs are acceptable for treatment. She is also scheduled for her final breast MRI on February 23 after completing  neoadjuvant chemotherapy She will see her surgeon on 08/30/2013 to make final decisions regarding her surgery.  Continue Questran and Imodium as needed for her diarrhea Continue oral potassium supplementation 20 meq 2 times daily Followup in 1 week for CBC, CMP and magnesium level and for IV fluids   Wilmon Arms, MD  Medical oncology   08/21/2013 9:52 AM

## 2013-08-21 NOTE — Patient Instructions (Signed)
[facility] Discharge Instructions for Patients Receiving Chemotherapy  Today you received the following chemotherapy agents Herceptin, Taxol, Carboplatin.  To help prevent nausea and vomiting after your treatment, we encourage you to take your nausea medication as prescribed.    If you develop nausea and vomiting that is not controlled by your nausea medication, call the clinic.   BELOW ARE SYMPTOMS THAT SHOULD BE REPORTED IMMEDIATELY:  *FEVER GREATER THAN 100.5 F  *CHILLS WITH OR WITHOUT FEVER  NAUSEA AND VOMITING THAT IS NOT CONTROLLED WITH YOUR NAUSEA MEDICATION  *UNUSUAL SHORTNESS OF BREATH  *UNUSUAL BRUISING OR BLEEDING  TENDERNESS IN MOUTH AND THROAT WITH OR WITHOUT PRESENCE OF ULCERS  *URINARY PROBLEMS  *BOWEL PROBLEMS  UNUSUAL RASH Items with * indicate a potential emergency and should be followed up as soon as possible.  Feel free to call the clinic should you have any questions or concerns. The clinic phone number is [phone].  It was my pleasure to take care of you today!  Leeanne Rio, RN

## 2013-08-21 NOTE — Progress Notes (Signed)
Put disability form on nurse's desk. °

## 2013-08-22 ENCOUNTER — Encounter (HOSPITAL_COMMUNITY): Payer: BC Managed Care – PPO | Attending: Oncology

## 2013-08-22 ENCOUNTER — Telehealth: Payer: Self-pay | Admitting: *Deleted

## 2013-08-22 DIAGNOSIS — C50412 Malignant neoplasm of upper-outer quadrant of left female breast: Secondary | ICD-10-CM

## 2013-08-22 DIAGNOSIS — C50419 Malignant neoplasm of upper-outer quadrant of unspecified female breast: Secondary | ICD-10-CM | POA: Insufficient documentation

## 2013-08-22 DIAGNOSIS — Z5189 Encounter for other specified aftercare: Secondary | ICD-10-CM

## 2013-08-22 LAB — MAGNESIUM: Magnesium: 1.4 mg/dL — ABNORMAL LOW (ref 1.5–2.5)

## 2013-08-22 MED ORDER — PEGFILGRASTIM INJECTION 6 MG/0.6ML
SUBCUTANEOUS | Status: AC
  Filled 2013-08-22: qty 0.6

## 2013-08-22 MED ORDER — PEGFILGRASTIM INJECTION 6 MG/0.6ML
6.0000 mg | Freq: Once | SUBCUTANEOUS | Status: AC
  Administered 2013-08-22: 6 mg via SUBCUTANEOUS

## 2013-08-22 NOTE — Telephone Encounter (Signed)
Per MD request. Called to inform pt of her magnesium level(1.4)  No answer. Left a detailed message for  pt to take 1 magnesium tab daily x 5 days. If patient has any questions, she can reach me at [phone]. Message to be forwarded to Dr. Earnest Conroy.

## 2013-08-22 NOTE — Progress Notes (Signed)
[name] [name] presents today for injection per MD orders. Neulasta 6mg  administered SQ in left Abdomen. Administration without incident. Patient tolerated well.

## 2013-08-23 ENCOUNTER — Other Ambulatory Visit: Payer: Self-pay | Admitting: Oncology

## 2013-08-23 ENCOUNTER — Encounter: Payer: Self-pay | Admitting: Oncology

## 2013-08-23 DIAGNOSIS — C50419 Malignant neoplasm of upper-outer quadrant of unspecified female breast: Secondary | ICD-10-CM

## 2013-08-23 NOTE — Progress Notes (Signed)
Faxed disability form to [employer] attn: Gabriel Rung @ [phone]

## 2013-08-28 ENCOUNTER — Ambulatory Visit
Admission: RE | Admit: 2013-08-28 | Discharge: 2013-08-28 | Disposition: A | Discharge status: Home / Self Care | Payer: BC Managed Care – PPO | Source: Ambulatory Visit | Attending: Oncology | Admitting: Oncology

## 2013-08-28 DIAGNOSIS — C50412 Malignant neoplasm of upper-outer quadrant of left female breast: Secondary | ICD-10-CM

## 2013-08-28 MED ORDER — GADOBENATE DIMEGLUMINE 529 MG/ML IV SOLN
14.0000 mL | Freq: Once | INTRAVENOUS | Status: AC | PRN
  Administered 2013-08-28: 14 mL via INTRAVENOUS

## 2013-08-29 ENCOUNTER — Ambulatory Visit (HOSPITAL_BASED_OUTPATIENT_CLINIC_OR_DEPARTMENT_OTHER): Payer: BC Managed Care – PPO

## 2013-08-29 ENCOUNTER — Telehealth: Payer: Self-pay | Admitting: *Deleted

## 2013-08-29 ENCOUNTER — Ambulatory Visit (HOSPITAL_BASED_OUTPATIENT_CLINIC_OR_DEPARTMENT_OTHER): Payer: BC Managed Care – PPO | Admitting: Physician Assistant

## 2013-08-29 ENCOUNTER — Telehealth: Payer: Self-pay | Admitting: Physician Assistant

## 2013-08-29 ENCOUNTER — Other Ambulatory Visit (HOSPITAL_BASED_OUTPATIENT_CLINIC_OR_DEPARTMENT_OTHER): Payer: BC Managed Care – PPO

## 2013-08-29 ENCOUNTER — Encounter: Payer: Self-pay | Admitting: Physician Assistant

## 2013-08-29 VITALS — BP 122/85 | HR 98 | Temp 98.3°F | Resp 20

## 2013-08-29 DIAGNOSIS — D649 Anemia, unspecified: Secondary | ICD-10-CM

## 2013-08-29 DIAGNOSIS — R197 Diarrhea, unspecified: Secondary | ICD-10-CM

## 2013-08-29 DIAGNOSIS — E876 Hypokalemia: Secondary | ICD-10-CM

## 2013-08-29 DIAGNOSIS — E860 Dehydration: Secondary | ICD-10-CM

## 2013-08-29 DIAGNOSIS — R110 Nausea: Secondary | ICD-10-CM | POA: Insufficient documentation

## 2013-08-29 DIAGNOSIS — C50412 Malignant neoplasm of upper-outer quadrant of left female breast: Secondary | ICD-10-CM

## 2013-08-29 DIAGNOSIS — C50419 Malignant neoplasm of upper-outer quadrant of unspecified female breast: Secondary | ICD-10-CM

## 2013-08-29 LAB — CBC WITH DIFFERENTIAL/PLATELET
BASO%: 0.6 % (ref 0.0–2.0)
Basophils Absolute: 0.1 10*3/uL (ref 0.0–0.1)
EOS%: 0.1 % (ref 0.0–7.0)
Eosinophils Absolute: 0 10*3/uL (ref 0.0–0.5)
HCT: 32.4 % — ABNORMAL LOW (ref 34.8–46.6)
HGB: 10.9 g/dL — ABNORMAL LOW (ref 11.6–15.9)
LYMPH%: 13.6 % — ABNORMAL LOW (ref 14.0–49.7)
MCH: 29.8 pg (ref 25.1–34.0)
MCHC: 33.6 g/dL (ref 31.5–36.0)
MCV: 88.5 fL (ref 79.5–101.0)
MONO#: 4.8 10*3/uL — ABNORMAL HIGH (ref 0.1–0.9)
MONO%: 23.4 % — ABNORMAL HIGH (ref 0.0–14.0)
NEUT#: 12.9 10*3/uL — ABNORMAL HIGH (ref 1.5–6.5)
NEUT%: 62.3 % (ref 38.4–76.8)
Platelets: 157 10*3/uL (ref 145–400)
RBC: 3.66 10*6/uL — ABNORMAL LOW (ref 3.70–5.45)
RDW: 15.9 % — ABNORMAL HIGH (ref 11.2–14.5)
WBC: 20.6 10*3/uL — ABNORMAL HIGH (ref 3.9–10.3)
lymph#: 2.8 10*3/uL (ref 0.9–3.3)
nRBC: 0 % (ref 0–0)

## 2013-08-29 LAB — COMPREHENSIVE METABOLIC PANEL (CC13)
ALT: 56 U/L — ABNORMAL HIGH (ref 0–55)
AST: 29 U/L (ref 5–34)
Albumin: 3.8 g/dL (ref 3.5–5.0)
Alkaline Phosphatase: 106 U/L (ref 40–150)
Anion Gap: 12 mEq/L — ABNORMAL HIGH (ref 3–11)
BUN: 8.5 mg/dL (ref 7.0–26.0)
CO2: 24 mEq/L (ref 22–29)
Calcium: 8.8 mg/dL (ref 8.4–10.4)
Chloride: 103 mEq/L (ref 98–109)
Creatinine: 0.8 mg/dL (ref 0.6–1.1)
Glucose: 112 mg/dl (ref 70–140)
Potassium: 2.8 mEq/L — CL (ref 3.5–5.1)
Sodium: 139 mEq/L (ref 136–145)
Total Bilirubin: 0.32 mg/dL (ref 0.20–1.20)
Total Protein: 6.6 g/dL (ref 6.4–8.3)

## 2013-08-29 LAB — MAGNESIUM (CC13): Magnesium: 1.4 mg/dl — CL (ref 1.5–2.5)

## 2013-08-29 MED ORDER — SODIUM CHLORIDE 0.9 % IV SOLN
INTRAVENOUS | Status: DC
  Administered 2013-08-29: 14:00:00 via INTRAVENOUS

## 2013-08-29 MED ORDER — DIPHENHYDRAMINE HCL 50 MG/ML IJ SOLN
25.0000 mg | Freq: Once | INTRAMUSCULAR | Status: AC
Start: 2013-08-29 — End: 2013-08-29
  Administered 2013-08-29: 25 mg via INTRAVENOUS

## 2013-08-29 MED ORDER — DIPHENHYDRAMINE HCL 50 MG/ML IJ SOLN
INTRAMUSCULAR | Status: AC
  Filled 2013-08-29: qty 1

## 2013-08-29 MED ORDER — HEPARIN SOD (PORK) LOCK FLUSH 100 UNIT/ML IV SOLN
500.0000 [IU] | Freq: Once | INTRAVENOUS | Status: AC
  Administered 2013-08-29: 500 [IU] via INTRAVENOUS
  Filled 2013-08-29: qty 5

## 2013-08-29 MED ORDER — SODIUM CHLORIDE 0.9 % IV SOLN
INTRAVENOUS | Status: AC
  Administered 2013-08-29: 16:00:00 via INTRAVENOUS
  Filled 2013-08-29: qty 250

## 2013-08-29 MED ORDER — SODIUM CHLORIDE 0.9 % IJ SOLN
10.0000 mL | INTRAMUSCULAR | Status: DC | PRN
  Administered 2013-08-29: 10 mL via INTRAVENOUS
  Filled 2013-08-29: qty 10

## 2013-08-29 MED ORDER — ONDANSETRON 8 MG/NS 50 ML IVPB
INTRAVENOUS | Status: AC
  Filled 2013-08-29: qty 8

## 2013-08-29 MED ORDER — ONDANSETRON 8 MG/50ML IVPB (CHCC)
8.0000 mg | Freq: Once | INTRAVENOUS | Status: AC
  Administered 2013-08-29: 8 mg via INTRAVENOUS

## 2013-08-29 NOTE — Progress Notes (Signed)
ID: [name] OB: [birth year]  MR#: [id number]  CSN#:[id number]  PCP: Harlow Asa, MD ; Sherie Don, NP GYN:   Conley Simmonds, MD  SU: Ovidio Kin, MD RADONC:  Lurline Hare, MD OTHER MD: Regino Schultze, MD;  Estelle Grumbles, MD  CHIEF COMPLAINT:  Left Breast Cancer/Neoadjuvant Chemo   HISTORY OF PRESENT ILLNESS: [name] [name] palpated a mass in her left breast in late August 2014. She was already scheduled for screening mammography at [facility] and this was performed 03/13/2013. Indeed a possible mass was noted in the left breast and on 03/29/2013 the patient underwent diagnostic left mammography and left ultrasonography. This showed a 2.5 cm irregular mass in the upper outer quadrant of the left breast. This was palpable.by ultrasound there was a 1.6 cm irregular hypoechoic mass in the area in question, and in addition to enlarged left axillary lymph nodes were noted, the largest measuring 2.5 cm.  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  Bilateral breast MRI 04/11/2013 shows 3 abnormal enhancing masses in the upper outer quadrant of the left breast measuring altogether 6.4 cm. The largest separate mass measured 2.7 cm. There were abnormal or large lymph nodes in the left axilla. Ultrasound guided biopsy of the 2 smaller masses has been scheduled.  The patient's subsequent history is as detailed below   INTERVAL HISTORY: [name] [name] is seen today in the infusion room while receiving supportive IV fluids. Her husband is with her today, but is waiting out in the lobby. She has completed her 6 cycles of docetaxel/cyclophosphamide plus  trastuzumab/pertuzumab.  The pertuzumab, however, was held after 4 cycles secondary to severe diarrhea.   Diarrhea continues to be [name] [name]'s biggest complaint, and she is still averaging approximately 6 loose stools daily despite the use of Questran and Imodium.  She feels weak and tired. She finds it difficult to keep herself well hydrated. She has also had both low potassium and low magnesium. She continues on an oral potassium supplement 20 mEq twice daily.   Her appetite is decreased.  Her stomach feels "unsettled" and queasy. She does get some relief from prochlorperazine.  We had previously stopped ondansetron due to headaches, but she tells me she "would rather deal with the headache" and would like to try the ondansetron again for the nausea.   The good news is, [name] [name]'s breast MRI earlier this week showed no areas of enhancement in the left breast. The results of the MRI are detailed below. She scheduled to meet with her surgeon, Dr. Ezzard Standing, tomorrow to discuss her upcoming definitive surgery.    REVIEW OF SYSTEMS: [name] [name] has had no fevers or chills. She is extremely fatigued. She denies any signs of abnormal bleeding. I will mention also that she's had no blood or mucus in the stool. She's had no rashes other than hyperpigmentation, and she does have no dyscrasia bilaterally in both the upper and lower extremities. She's had no drainage from beneath the nail, but they are sensitive to touch. She is using hydrogen peroxide soaks on a daily basis. She's had no mouth ulcers or oral sensitivity. She has no problems swallowing. She denies any current cough,  phlegm production, increased shortness of breath, chest pain, or palpitations. She does have shortness of breath with exertion, but this is stable. She currently denies any abnormal headaches or dizziness. She's had some intermittent signs of neuropathy, but denies any current myalgias, arthralgias, or bony pain. She's had no peripheral  swelling.  A detailed review of systems is otherwise stable and noncontributory.   PAST MEDICAL HISTORY: Past Medical History  Diagnosis Date  . Chest pain 2009    Consultation-[facility], negative chest CT; nl echo in 2005; h/o palpitations  . Palpitations   . Degenerative joint disease     + degenerative joint disease of the lumbosacral spine  . Colitis 2010    not IBD  . Herpes simplex type II infection   . Allergic rhinitis   . Hypercholesteremia     "slightly high"  . Anxiety   . Heart murmur     "small"  . GERD (gastroesophageal reflux disease)     "a little"  . Breast cancer 03/31/13    left  . Cancer   . Depression   . Hypertension   . Anemia, unspecified 05/30/2013    PAST SURGICAL HISTORY: Past Surgical History  Procedure Laterality Date  . Colonoscopy  10/2010    proctitis; melanosis coli  . Tubal ligation    . Breast excisional biopsy  04/2003    Left; benign disease  . Right oophorectomy  03/13/2007  . Abdominal hysterectomy  03/13/2007    TAH ?BSO--Dr Cheron Every, [place]  . Eye surgery Left     "fix lazy eye"  . Portacath placement Right 04/21/2013    Procedure: INSERTION PORT-A-CATH;  Surgeon: Kandis Cocking, MD;  Location: [facility];  Service: General;  Laterality: Right;    FAMILY HISTORY Family History  Problem Relation Age of Onset  . Aneurysm Mother     Cerebral  . Hypertension Mother   . Hyperlipidemia Mother   . Stroke Mother   . Coronary artery disease Father   . Hypertension Father   . Hyperlipidemia Father   . Lung cancer Brother   . Cancer Brother     Lung  The patient's father died at the age of 16 from a myocardial infarction. The patient's mother died at the age of 86 from a stroke. The patient had 6 brothers, 2 sisters. One brother has a history of lung cancer in the setting of tobacco abuse. Another brother had multiple myeloma. There is no history of breast or ovarian cancer in the family  GYNECOLOGIC HISTORY:  Menarche age 53,  first live birth age 74, the patient is GX P4. She status post abdominal hysterectomy with bilateral salpingo-oophorectomy. She never took hormone replacement.   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 Her husband [name] [name] works in Designer, fashion/clothing. Daughter [name] [name] lives at home with the patient. She is not employed. Son [name] [name] lives in [place] and is not employed. Daughter [name] [name] lives in [place] and is a Community education officer. Daughter [name] [name] is a Child psychotherapist in [place]. The patient has 10 grandchildren. She attends the [employer]     ADVANCED DIRECTIVES: Not in place   HEALTH MAINTENANCE:  (Updated February 2015)  History  Substance Use Topics  . Smoking status: Former Smoker -- 0.50 packs/day for 15 years    Types: Cigarettes    Quit date: 07/06/1982  . Smokeless tobacco: Never Used  .  Alcohol Use: No     Colonoscopy:2012  PAP:2014   Bone density: Never  Lipid panel: Not on file    Allergies  Allergen Reactions  . Doxycycline Other (See Comments)    Unknown    Current Outpatient Prescriptions  Medication Sig Dispense Refill  . ALPRAZolam (XANAX) 1 MG tablet TAKE 1 TABLET BY MOUTH 3 TIMES A DAY AS NEEDED FOR ANXIETY  90 tablet  0  . atenolol (TENORMIN) 50 MG tablet TAKE 1 TABLET BY MOUTH EVERY DAY FOR PALPITATION  90 tablet  1  . cholestyramine (QUESTRAN) 4 G packet Take 1 packet (4 g total) by mouth 2 (two) times daily as needed (diarrhea).  30 each  1  . citalopram (CELEXA) 40 MG tablet Take 40 mg by mouth every evening.      . clindamycin (CLINDAGEL) 1 % gel Apply topically 2 (two) times daily.  30 g  1  . dexamethasone (DECADRON) 4 MG tablet Take 2 tablets (8 mg total) by mouth 2 (two) times daily with a meal. Take two times a day the day before Taxotere. Then take two times a day starting the day after chemo for 3 days.  30 tablet  1  .  diphenhydramine-acetaminophen (TYLENOL PM) 25-500 MG TABS Take 2 tablets by mouth at bedtime.      . lidocaine-prilocaine (EMLA) cream Apply topically as needed.  30 g  0  . loperamide (IMODIUM) 2 MG capsule Take 2 mg by mouth as needed for diarrhea or loose stools.      Marland Kitchen LORazepam (ATIVAN) 0.5 MG tablet TAKE 1 TABLET EVERY 6 HOURS AS NEEDED FOR NAUSEA OR VOMITING  30 tablet  0  . nystatin (MYCOSTATIN) 100000 UNIT/ML suspension       . ondansetron (ZOFRAN) 8 MG tablet Take 1 tablet (8 mg total) by mouth 2 (two) times daily. Take two times a day starting the day after chemo for 3 days. Then take two times a day as needed for nausea or vomiting.  30 tablet  1  . potassium chloride (K-DUR) 10 MEQ tablet Take 2 tablets (20 mEq total) by mouth 3 (three) times daily. X 7 days.  Then 2 tabs by mouth twice daily or as directed  180 tablet  1  . potassium chloride (K-DUR) 10 MEQ tablet Take 20 mEq by mouth 3 (three) times daily. X7 days and then twice a day after      . prochlorperazine (COMPAZINE) 10 MG tablet Take 1 tablet (10 mg total) by mouth every 6 (six) hours as needed (Nausea or vomiting).  30 tablet  1  . valACYclovir (VALTREX) 1000 MG tablet Take 1,000 mg by mouth daily.      . [DISCONTINUED] potassium chloride SA (K-DUR,KLOR-CON) 20 MEQ tablet Take 1 tablet (20 mEq total) by mouth 2 (two) times daily.  20 tablet  1   Current Facility-Administered Medications  Medication Dose Route Frequency Provider Last Rate Last Dose  . sodium chloride 0.9 % 250 mL with potassium chloride 20 mEq infusion   Intravenous Continuous Catalina Gravel, PA-C 125 mL/hr at 08/29/13 1607     Facility-Administered Medications Ordered in Other Visits  Medication Dose Route Frequency Provider Last Rate Last Dose  . 0.9 %  sodium chloride infusion   Intravenous Continuous Maven Varelas G Cieanna Stormes, PA-C 500 mL/hr at 08/29/13 1405    . heparin lock flush 100 unit/mL  500 Units Intravenous Once Lititia Sen G Bobbiejo Ishikawa, PA-C      . sodium  chloride  0.9 % injection 10 mL  10 mL Intravenous PRN Zavien Clubb Allegra Grana, PA-C        OBJECTIVE: Middle-aged [place] American woman who appears weak and tired but is in no acute distress VITAL SIGNS: Blood pressure: 122/85  Pulse: 98  Respirations: 20  Weight: 71.94 kg (as of 08/21/2013)  Height: 152.4 cm (as of 08/21/2013)  BMI: 31.0 (as of 08/21/2013)  ECOG: 2 Physical Exam: HEENT:  Sclerae anicteric.  Oropharynx clear, pink, and dry. No ulcerations. No evidence of oropharyngeal candidiasis. EXTREMITIES:  No peripheral edema.   SKIN: hyperpigmentation noted on the hands and nailbeds of the upper extremity bilaterally. There is notable nail dyscrasia with some loosening of the nails from the nail beds, but no drainage or bleeding noted. Port is intact in the right chest wall, currently accessed for infusion, with no erythema, edema, or evidence of infection/cellulitis. NEURO:  Nonfocal. Well oriented.   Fatigued affect.  Remainder of physical exam was deferred today.     LAB RESULTS:   Lab Results  Component Value Date   WBC 20.6* 08/29/2013   NEUTROABS 12.9* 08/29/2013   HGB 10.9* 08/29/2013   HCT 32.4* 08/29/2013   MCV 88.5 08/29/2013   PLT 157 08/29/2013      Chemistry      Component Value Date/Time   NA 139 08/29/2013 1455   NA 139 06/06/2008 0545   K 2.8* 08/29/2013 1455   K 3.8 06/06/2008 0545   CL 104 06/06/2008 0545   CO2 24 08/29/2013 1455   CO2 26 06/06/2008 0545   BUN 8.5 08/29/2013 1455   BUN 6 06/06/2008 0545   CREATININE 0.8 08/29/2013 1455   CREATININE 0.77 06/06/2008 0545      Component Value Date/Time   CALCIUM 8.8 08/29/2013 1455   CALCIUM 8.7 06/06/2008 0545   ALKPHOS 106 08/29/2013 1455   ALKPHOS 62 04/12/2008 1725   AST 29 08/29/2013 1455   AST 21 04/12/2008 1725   ALT 56* 08/29/2013 1455   ALT 28 04/12/2008 1725   BILITOT 0.32 08/29/2013 1455   BILITOT 0.5 04/12/2008 1725     Magnesium  1.4  08/29/2013    STUDIES:   Echocardiogram on 04/20/2013 showed an ejection  fraction of 60-65%.  This is due to be repeated in late January, and it has been ordered but not yet scheduled as of 07/10/2013.  Mr Breast Bilateral W Wo Contrast 08/28/2013   CLINICAL DATA:  Known left breast grade 2-3 invasive ductal carcinoma. Assess response to neoadjuvant chemotherapy.  EXAM: BILATERAL BREAST MRI WITH AND WITHOUT CONTRAST  TECHNIQUE: Multiplanar, multisequence MR images of both breasts were obtained prior to and following the intravenous administration of 14ml of MultiHance  THREE-DIMENSIONAL MR IMAGE RENDERING ON INDEPENDENT WORKSTATION:  Three-dimensional MR images were rendered by post-processing of the original MR data on an independent workstation. The three-dimensional MR images were interpreted, and findings are reported in the following complete MRI report for this study. Three dimensional images were evaluated at the independent DynaCad workstation  COMPARISON:  Previous MR's of the breast dated 06/12/2013 and 04/11/2013.  FINDINGS: Breast composition:  B:  scattered fibroglandular  Background parenchymal enhancement: Mild to moderate  Right breast: No mass or abnormal enhancement.  Left breast: The previously seen areas of enhancement located within the upper outer quadrant of the left breast are no longer visible.  Lymph nodes: The multiple previously seen enlarged left axillary lymph nodes have considerably decreased in size with the largest remaining  lymph node visible measuring 7 mm in size.  Ancillary findings:  None.  IMPRESSION: Excellent response to neoadjuvant chemotherapy with no significant residual enhancing masses visible within the breast. Also, the left axillary lymph nodes have markedly decreased in size.  RECOMMENDATION: Treatment plan.  BI-RADS CATEGORY  6: Known biopsy-proven malignancy - appropriate action should be taken.   Electronically Signed   By: Anselmo Pickler M.D.   On: 08/28/2013 11:27     ASSESSMENT/PLAN: 53 y.o. [name] [name], [place] woman \  (1)  status  post left breast biopsy 03/29/2013 for a clinical T2-T3 pN1, stage IIB-IIIA invasive ductal carcinoma, grade 2-3, estrogen receptor 39% positive (with moderate staining intensity), progesterone receptor negative, with an MIB-1 of 83%, and HER-2 amplified by CISH, with a ratio of 7.5 and 15 HER-2 copies per cell  (2) receiving neoadjuvant chemotherapy, with trastuzumab/ pertuzumab/ carboplatin/docetaxel started 05/01/2013, to be repeated every 3 weeks x6. Pertuzumab  held after 4 cycles of secondary to diarrhea. The patient [facility] Ne[facility] day [facility] per her request. Echocardiogram to be repeated on 08/15/2013.  Trastuzumab to be continued for total of one year.   (3) Diarrhea most likely secondary to chemotherapy (Pertuzumab), resulting in dehydration -- receiving supportive IV fluids   (4) skin rash most likely secondary to pertuzumab: Resolved.   (5) chemotherapy-induced anemia: Iron Indices are normal. If she gets symptomatic/ hemoglobin less than 8 g/dL we'll arrange for blood transfusion  (6)  hypokalemia, currently on 20 mEq by mouth twice a day, being increased to 3 times a day and also being given IV  (7)  Hypomagnesemia -- to be given IV on 08/30/2013    PLAN:  I have reviewed Zoiey's case with Dr. Darnelle Catalan, including her current labs. She will receive her supportive IV fluids today, and we are also going to give her 20 mEq of potassium chloride IV as well. We would like to have her return tomorrow for additional fluids, in addition to an additional 20 mEq IV potassium and 1 g IV magnesium sulfate.  She's also going to increase her oral potassium to 20 mEq 3 times daily rather than 2.   I encouraged the patient to continue using Questran and Imodium regularly for the diarrhea. I also encouraged her to keep herself well hydrated and to keep [place] up to date if she is unable to do so.  She scheduled to meet with her surgeon tomorrow to discuss her upcoming surgery. I  will then see her next Monday on March 2 for repeat labs and physical exam. We are scheduling her for some additional supportive IV fluids at that time as well. We will also be discussing the initiation of her single agent trastuzumab which we given every 3 weeks.  All this was reviewed in detail with the patient today, and she was given this information in writing as well. She voices her understanding and agreement with the above, and knows to calls with any changes or problems.   [name] [place]  Medical oncology   08/29/2013 4:10 PM

## 2013-08-29 NOTE — Telephone Encounter (Signed)
, °

## 2013-08-29 NOTE — Patient Instructions (Addendum)
Dehydration, Adult Dehydration means your body does not have as much fluid as it needs. Your kidneys, brain, and heart will not work properly without the right amount of fluids and salt.  HOME CARE  Ask your doctor how to replace body fluid losses (rehydrate).  Drink enough fluids to keep your pee (urine) clear or pale yellow.  Drink small amounts of fluids often if you feel sick to your stomach (nauseous) or throw up (vomit).  Eat like you normally do.  Avoid:  Foods or drinks high in sugar.  Bubbly (carbonated) drinks.  Juice.  Very hot or cold fluids.  Drinks with caffeine.  Fatty, greasy foods.  Alcohol.  Tobacco.  Eating too much.  Gelatin desserts.  Wash your hands to avoid spreading germs (bacteria, viruses).  Only take medicine as told by your doctor.  Keep all doctor visits as told. GET HELP RIGHT AWAY IF:   You cannot drink something without throwing up.  You get worse even with treatment.  Your vomit has blood in it or looks greenish.  Your poop (stool) has blood in it or looks black and tarry.  You have not peed in 6 to 8 hours.  You pee a small amount of very dark pee.  You have a fever.  You pass out (faint).  You have belly (abdominal) pain that gets worse or stays in one spot (localizes).  You have a rash, stiff neck, or bad headache.  You get easily annoyed, sleepy, or are hard to wake up.  You feel weak, dizzy, or very thirsty. MAKE SURE YOU:   Understand these instructions.  Will watch your condition.  Will get help right away if you are not doing well or get worse. Document Released: 04/18/2009 Document Revised: 09/14/2011 Document Reviewed: 02/09/2011 ExitCare Patient Information 2014 [employer].  Hypokalemia Hypokalemia means that the amount of potassium in the blood is lower than normal.Potassium is a chemical, called an electrolyte, that helps regulate the amount of fluid in the body. It also stimulates muscle  contraction and helps nerves function properly.Most of the body's potassium is inside of cells, and only a very small amount is in the blood. Because the amount in the blood is so small, minor changes can be life-threatening. CAUSES  Antibiotics.  Diarrhea or vomiting.  Using laxatives too much, which can cause diarrhea.  Chronic kidney disease.  Water pills (diuretics).  Eating disorders (bulimia).  Low magnesium level.  Sweating a lot. SIGNS AND SYMPTOMS  Weakness.  Constipation.  Fatigue.  Muscle cramps.  Mental confusion.  Skipped heartbeats or irregular heartbeat (palpitations).  Tingling or numbness. DIAGNOSIS  Your health care provider can diagnose hypokalemia with blood tests. In addition to checking your potassium level, your health care provider may also check other lab tests. TREATMENT Hypokalemia can be treated with potassium supplements taken by mouth or adjustments in your current medicines. If your potassium level is very low, you may need to get potassium through a vein (IV) and be monitored in the hospital. A diet high in potassium is also helpful. Foods high in potassium are:  Nuts, such as peanuts and pistachios.  Seeds, such as sunflower seeds and pumpkin seeds.  Peas, lentils, and lima beans.  Whole grain and bran cereals and breads.  Fresh fruit and vegetables, such as apricots, avocado, bananas, cantaloupe, kiwi, oranges, tomatoes, asparagus, and potatoes.  Orange and tomato juices.  Red meats.  Fruit yogurt. HOME CARE INSTRUCTIONS  Take all medicines as prescribed by your   health care provider.  Maintain a healthy diet by including nutritious food, such as fruits, vegetables, nuts, whole grains, and lean meats.  If you are taking a laxative, be sure to follow the directions on the label. SEEK MEDICAL CARE IF:  Your weakness gets worse.  You feel your heart pounding or racing.  You are vomiting or having diarrhea.  You are  diabetic and having trouble keeping your blood glucose in the normal range. SEEK IMMEDIATE MEDICAL CARE IF:  You have chest pain, shortness of breath, or dizziness.  You are vomiting or having diarrhea for more than 2 days.  You faint. MAKE SURE YOU:   Understand these instructions.  Will watch your condition.  Will get help right away if you are not doing well or get worse. Document Released: 06/22/2005 Document Revised: 04/12/2013 Document Reviewed: 12/23/2012 ExitCare Patient Information 2014 [employer].     

## 2013-08-29 NOTE — Telephone Encounter (Signed)
Staff message from Micah Flesher I have sccheduled appts

## 2013-08-30 ENCOUNTER — Ambulatory Visit (HOSPITAL_BASED_OUTPATIENT_CLINIC_OR_DEPARTMENT_OTHER): Payer: BC Managed Care – PPO

## 2013-08-30 ENCOUNTER — Encounter (INDEPENDENT_AMBULATORY_CARE_PROVIDER_SITE_OTHER): Payer: BC Managed Care – PPO | Admitting: Surgery

## 2013-08-30 ENCOUNTER — Ambulatory Visit (INDEPENDENT_AMBULATORY_CARE_PROVIDER_SITE_OTHER): Payer: BC Managed Care – PPO | Admitting: Surgery

## 2013-08-30 VITALS — BP 126/76 | HR 72 | Temp 98.0°F | Resp 18 | Ht 61.0 in | Wt 153.0 lb

## 2013-08-30 VITALS — BP 108/66 | HR 84 | Temp 97.7°F | Resp 18

## 2013-08-30 DIAGNOSIS — C50419 Malignant neoplasm of upper-outer quadrant of unspecified female breast: Secondary | ICD-10-CM

## 2013-08-30 DIAGNOSIS — E876 Hypokalemia: Secondary | ICD-10-CM

## 2013-08-30 DIAGNOSIS — R197 Diarrhea, unspecified: Secondary | ICD-10-CM

## 2013-08-30 DIAGNOSIS — E860 Dehydration: Secondary | ICD-10-CM

## 2013-08-30 DIAGNOSIS — C50412 Malignant neoplasm of upper-outer quadrant of left female breast: Secondary | ICD-10-CM

## 2013-08-30 MED ORDER — HEPARIN SOD (PORK) LOCK FLUSH 100 UNIT/ML IV SOLN
500.0000 [IU] | Freq: Once | INTRAVENOUS | Status: AC
  Administered 2013-08-30: 500 [IU] via INTRAVENOUS
  Filled 2013-08-30: qty 5

## 2013-08-30 MED ORDER — SODIUM CHLORIDE 0.9 % IV SOLN: INTRAVENOUS | Status: DC

## 2013-08-30 MED ORDER — SODIUM CHLORIDE 0.9 % IV SOLN
Freq: Once | INTRAVENOUS | Status: AC
  Administered 2013-08-30: 15:00:00 via INTRAVENOUS
  Filled 2013-08-30: qty 1000

## 2013-08-30 MED ORDER — SODIUM CHLORIDE 0.9 % IJ SOLN
10.0000 mL | Freq: Once | INTRAMUSCULAR | Status: AC
  Administered 2013-08-30: 10 mL via INTRAVENOUS
  Filled 2013-08-30: qty 10

## 2013-08-30 NOTE — Patient Instructions (Signed)
Dehydration, Adult Dehydration means your body does not have as much fluid as it needs. Your kidneys, brain, and heart will not work properly without the right amount of fluids and salt.  HOME CARE  Ask your doctor how to replace body fluid losses (rehydrate).  Drink enough fluids to keep your pee (urine) clear or pale yellow.  Drink small amounts of fluids often if you feel sick to your stomach (nauseous) or throw up (vomit).  Eat like you normally do.  Avoid:  Foods or drinks high in sugar.  Bubbly (carbonated) drinks.  Juice.  Very hot or cold fluids.  Drinks with caffeine.  Fatty, greasy foods.  Alcohol.  Tobacco.  Eating too much.  Gelatin desserts.  Wash your hands to avoid spreading germs (bacteria, viruses).  Only take medicine as told by your doctor.  Keep all doctor visits as told. GET HELP RIGHT AWAY IF:   You cannot drink something without throwing up.  You get worse even with treatment.  Your vomit has blood in it or looks greenish.  Your poop (stool) has blood in it or looks black and tarry.  You have not peed in 6 to 8 hours.  You pee a small amount of very dark pee.  You have a fever.  You pass out (faint).  You have belly (abdominal) pain that gets worse or stays in one spot (localizes).  You have a rash, stiff neck, or bad headache.  You get easily annoyed, sleepy, or are hard to wake up.  You feel weak, dizzy, or very thirsty. MAKE SURE YOU:   Understand these instructions.  Will watch your condition.  Will get help right away if you are not doing well or get worse. Document Released: 04/18/2009 Document Revised: 09/14/2011 Document Reviewed: 02/09/2011 [facility] Patient Information 2014 [place], [place].  Hypokalemia Hypokalemia means that the amount of potassium in the blood is lower than normal.Potassium is a chemical, called an electrolyte, that helps regulate the amount of fluid in the body. It also stimulates muscle  contraction and helps nerves function properly.Most of the body's potassium is inside of cells, and only a very small amount is in the blood. Because the amount in the blood is so small, minor changes can be life-threatening. CAUSES  Antibiotics.  Diarrhea or vomiting.  Using laxatives too much, which can cause diarrhea.  Chronic kidney disease.  Water pills (diuretics).  Eating disorders (bulimia).  Low magnesium level.  Sweating a lot. SIGNS AND SYMPTOMS  Weakness.  Constipation.  Fatigue.  Muscle cramps.  Mental confusion.  Skipped heartbeats or irregular heartbeat (palpitations).  Tingling or numbness. DIAGNOSIS  Your health care provider can diagnose hypokalemia with blood tests. In addition to checking your potassium level, your health care provider may also check other lab tests. TREATMENT Hypokalemia can be treated with potassium supplements taken by mouth or adjustments in your current medicines. If your potassium level is very low, you may need to get potassium through a vein (IV) and be monitored in the hospital. A diet high in potassium is also helpful. Foods high in potassium are:  Nuts, such as peanuts and pistachios.  Seeds, such as sunflower seeds and pumpkin seeds.  Peas, lentils, and lima beans.  Whole grain and bran cereals and breads.  Fresh fruit and vegetables, such as apricots, avocado, bananas, cantaloupe, kiwi, oranges, tomatoes, asparagus, and potatoes.  Orange and tomato juices.  Red meats.  Fruit yogurt. HOME CARE INSTRUCTIONS  Take all medicines as prescribed by your  health care provider.  Maintain a healthy diet by including nutritious food, such as fruits, vegetables, nuts, whole grains, and lean meats.  If you are taking a laxative, be sure to follow the directions on the label. SEEK MEDICAL CARE IF:  Your weakness gets worse.  You feel your heart pounding or racing.  You are vomiting or having diarrhea.  You are  diabetic and having trouble keeping your blood glucose in the normal range. SEEK IMMEDIATE MEDICAL CARE IF:  You have chest pain, shortness of breath, or dizziness.  You are vomiting or having diarrhea for more than 2 days.  You faint. MAKE SURE YOU:   Understand these instructions.  Will watch your condition.  Will get help right away if you are not doing well or get worse. Document Released: 06/22/2005 Document Revised: 04/12/2013 Document Reviewed: 12/23/2012 [facility] Patient Information 2014 [place].  Hypomagnesemia Magnesium is a common ion (mineral) in the body which is needed for metabolism. It is about how the body handles food and other chemical reactions necessary for life. Only about 2% of the magnesium in our body is found in the blood. When this is low, it is called hypomagnesemia. The blood will measure only a tiny amount of the magnesium in our body. When it is low in our blood, it does not mean that the whole body supply is low. The normal serum concentration ranges from 1.8-2.5 mEq/L. When the level gets to be less than 1.0 mEq/L, a number of problems begin to happen.  CAUSES   Receiving intravenous fluids without magnesium replacement.  Loss of magnesium from the bowel by naso-gastric suction.  Loss of magnesium from nausea and vomiting or severe diarrhea. Any of the inflammatory bowel conditions can cause this.  Abuse of alcohol often leads to low serum magnesium.  An inherited form of magnesium loss happens when the kidneys lose magnesium. This is called familial or primary hypomagnesemia.  Some medications such as diuretics also cause the loss of magnesium. SYMPTOMS  These following problems are worse if the changes in magnesium levels come on suddenly.  Tremor.  Confusion.  Muscle weakness.  Over-sensitive to sights and sounds.  Sensitive reflexes.  Depression.  Muscular fibrillations.  Over-reactivity of the  nerves.  Irritability.  Psychosis.  Spasms of the hand muscles.  Tetany (where the muscles go into uncontrollable spasms). DIAGNOSIS  This condition can be diagnosed by blood tests. TREATMENT   In emergency, magnesium can be given intravenously (by vein).  If the condition is less worrisome, it can be corrected by diet. High levels of magnesium are found in green leafy vegetables, peas, beans and nuts among other things. It can also be given through medications by mouth.  If it is being caused by medications, changes can be made.  If alcohol is a problem, help is available if there are difficulties giving it up. Document Released: 03/18/2005 Document Revised: 09/14/2011 Document Reviewed: 02/10/2008 [facility] Patient Information 2014 [place].

## 2013-08-30 NOTE — Progress Notes (Signed)
Re:   [name] [name] DOB:   Sep 06, 1960 MRN:   [id number]  [facility]  ASSESSMENT AND PLAN: 1.  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 SLNBx depending on trial.  Receiving neoadjuvant chemotherapy, with trastuzumab/ pertuzumab/ carboplatin/docetaxel started 05/01/2013.  Last chemotx 08/15/2013.  MRI 08/28/2013 - shows no residual enhancing mass.  I reviewed the films with Dr. Jean Rosenthal.  She is a candidate for lumpectomy (bracketed), but will need a left axillary node dissection. She had multiple suspicious nodes on her original MRI and still has two areas that are suspicious.  She had left the office, but I talked to her about this plan by phone.  1a.  Power port placed 04/21/2013 - D Enslie Sahota   1b.  Biopsy of superior area of left breast showed fat necrosis - 04/18/2013 2.  Palpitations  Sees Dr. Elvera Lennox. [facility] 3.  Osteoarthritis of both hips 4.  On Celebrex  To stop this one week before surgery 5.  To stop Premarin  6.  Anxiety 7.  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  Will address this after the end of her treatment for her breast cancer.  She has seen Dr. Michaell Cowing for this.  A CT scan 03/27/2013 showed no direct evidence of a rectovaginal fistula.  Dr. Michaell Cowing has suggested another test to try to "document" the fistula.  That is on hold during for now as she goes through the treatment for breast cancer.    She still has stool from her vagina.  REFERRING PHYSICIAN: Harlow Asa, MD  HISTORY OF PRESENT ILLNESS: [name] [name] is a 53 y.o. (DOB: May 23, 1961)  AA  female whose primary care physician is LUKING,W S, MD and comes for follow up of  left breast cancer.  She comes by herself. She is doing well.  She has tolerated the chemotx fairly well.  She has had some trouble with her fingernails recently.  MRI 08/28/2013 - shows no residual enhancing mass.  I reviewed the MRI with her and gave her a copy.  The report looks encouraging. I spoke briefly to Dr. Michell Heinrich about lumpectomy plans and node dissection plans.  History of breast cancer: She gets a mammogram every year.  She felt a lump in the upper outer part of her left breast.  She has no family history of breast cancer.  She takes a Premarin on a PRN basis.  It sounds like she takes no more that a few tabs a month.  She will stop this.  She had a hysterectomy for fibroids in 2010.  She had a mammogram at [facility] on 03/13/2013 which found a left breast mass.  On 03/29/2013 she had a left breast biopsy.  The biopsy (Accession: [id number]) showed IDC and metastatic carcinoma to a left axillary lymph node. MRI 04/11/2013 shows - 3 abnormal enhancing masses in the upper-outer quadrant of left breast encompassing a total area of the 6.4 x 2.4 x 4 cm as described. The largest, superior and posterior mass was previously biopsied proven cancer. The 2 other smaller masses seen are suspicious for neoplasm.  She is for biopsy of these two  other areas this Friday, 10/9.   Past Medical History  Diagnosis Date  . Chest pain 2009    Consultation-[facility], negative chest CT; nl echo in 2005; h/o palpitations  . Palpitations   . Degenerative joint disease     + degenerative joint disease of the lumbosacral spine  . Colitis 2010    not IBD  . Herpes simplex type II infection   . Allergic rhinitis   . Hypercholesteremia     "slightly high"  . Anxiety   . Heart murmur     "small"  . GERD (gastroesophageal reflux disease)     "a little"  . Breast cancer 03/31/13    left  . Cancer   . Depression   . Hypertension   . Anemia, unspecified 05/30/2013     Past Surgical History  Procedure  Laterality Date  . Colonoscopy  10/2010    proctitis; melanosis coli  . Tubal ligation    . Breast excisional biopsy  04/2003    Left; benign disease  . Right oophorectomy  03/13/2007  . Abdominal hysterectomy  03/13/2007    TAH ?BSO--Dr Cheron Every, [place]  . Eye surgery Left     "fix lazy eye"  . Portacath placement Right 04/21/2013    Procedure: INSERTION PORT-A-CATH;  Surgeon: Kandis Cocking, MD;  Location: [facility];  Service: General;  Laterality: Right;      Current Outpatient Prescriptions  Medication Sig Dispense Refill  . ALPRAZolam (XANAX) 1 MG tablet TAKE 1 TABLET BY MOUTH 3 TIMES A DAY AS NEEDED FOR ANXIETY  90 tablet  0  . atenolol (TENORMIN) 50 MG tablet TAKE 1 TABLET BY MOUTH EVERY DAY FOR PALPITATION  90 tablet  1  . cholestyramine (QUESTRAN) 4 G packet Take 1 packet (4 g total) by mouth 2 (two) times daily as needed (diarrhea).  30 each  1  . citalopram (CELEXA) 40 MG tablet Take 40 mg by mouth every evening.      . clindamycin (CLINDAGEL) 1 % gel Apply topically 2 (two) times daily.  30 g  1  . dexamethasone (DECADRON) 4 MG tablet Take 2 tablets (8 mg total) by mouth 2 (two) times daily with a meal. Take two times a day the day before Taxotere. Then take two times a day starting the day after chemo for 3 days.  30 tablet  1  . diphenhydramine-acetaminophen (TYLENOL PM) 25-500 MG TABS Take 2 tablets by mouth at bedtime.      . lidocaine-prilocaine (EMLA) cream Apply topically as needed.  30 g  0  . loperamide (IMODIUM) 2 MG capsule Take 2 mg by mouth as needed for diarrhea or loose stools.      Marland Kitchen LORazepam (ATIVAN) 0.5 MG tablet TAKE 1 TABLET EVERY 6 HOURS AS NEEDED FOR NAUSEA OR VOMITING  30 tablet  0  . ondansetron (ZOFRAN) 8 MG tablet Take 1 tablet (8 mg total) by mouth 2 (two) times daily. Take two times a day starting the day after chemo for 3 days. Then take two times a day as needed for nausea or vomiting.  30 tablet  1  . potassium chloride (K-DUR) 10 MEQ tablet Take 2  tablets (20 mEq total) by mouth 3 (three) times daily. X 7 days.  Then 2 tabs by mouth twice daily or as directed  180 tablet  1  . potassium chloride (K-DUR) 10 MEQ tablet Take 20 mEq by mouth 3 (three) times daily. X7 days and then twice  a day after      . prochlorperazine (COMPAZINE) 10 MG tablet Take 1 tablet (10 mg total) by mouth every 6 (six) hours as needed (Nausea or vomiting).  30 tablet  1  . valACYclovir (VALTREX) 1000 MG tablet Take 1,000 mg by mouth daily.      Marland Kitchen nystatin (MYCOSTATIN) 100000 UNIT/ML suspension       . [DISCONTINUED] potassium chloride SA (K-DUR,KLOR-CON) 20 MEQ tablet Take 1 tablet (20 mEq total) by mouth 2 (two) times daily.  20 tablet  1   No current facility-administered medications for this visit.      Allergies  Allergen Reactions  . Doxycycline Other (See Comments)    Unknown    REVIEW OF SYSTEMS: Cardiac:  Palpitations.  Has seen Dr. Dietrich Pates. Gastrointestinal:  Has seen Dr. Michaell Cowing for possible colo-vesicle fistula, but CT scan was negative.  Had negative colonoscopy by Dr. Karilyn Cota in 11/03/2010.  For now, her fistula evaluation and treatment are on hold. Musculoskeletal:  Osteoarthritis of both hips, on Celebrex.  She cannot remember the name of the orthopedists. Hematologic:  No bleeding disorder.  On Celebrex. Psycho-social:  The patient is oriented.   History of anxiety.  SOCIAL and FAMILY HISTORY: Married. Husband, Christiane Ha. She has 4 children.  3 daughters and 1 son. Ages 59, 32, 23, 28. Works as a Passenger transport manager at [employer]  PHYSICAL EXAM: BP 126/76  Pulse 72  Temp(Src) 98 F (36.7 C)  Resp 18  Ht 5\' 1"  (1.549 m)  Wt 153 lb (69.4 kg)  BMI 28.92 kg/m2  LMP 12/04/2008  General: WN AA F who is alert and generally healthy appearing.  NECK:  Supple.  No thyroid mass. LYMPH NODES:  No cervical, supraclavicular, or axillary adenopathy. BREASTS -  RIGHT:  No palpable mass or nodule.  No nipple discharge.  Port in upper inner  right breast   LEFT:  I cannot feel a specific mass.  But she did not have a specific mass on initial exam. UPPER EXTREMITIES:  No evidence of lymphedema.  DATA REVIEWED: Epic notes and x-ray reports.  Discussed with Dr. Pershing Cox, MD,  [facility] [address] [place]  [address]   [place], [place]    [address] Phone:  [phone] FAX:  [phone]

## 2013-09-03 HISTORY — PX: BREAST LUMPECTOMY: SHX2

## 2013-09-04 ENCOUNTER — Ambulatory Visit: Payer: BC Managed Care – PPO

## 2013-09-04 ENCOUNTER — Ambulatory Visit (HOSPITAL_BASED_OUTPATIENT_CLINIC_OR_DEPARTMENT_OTHER): Payer: BC Managed Care – PPO | Admitting: Physician Assistant

## 2013-09-04 ENCOUNTER — Telehealth: Payer: Self-pay | Admitting: *Deleted

## 2013-09-04 ENCOUNTER — Ambulatory Visit (HOSPITAL_COMMUNITY)
Admission: RE | Admit: 2013-09-04 | Discharge: 2013-09-04 | Disposition: A | Discharge status: Home / Self Care | Payer: BC Managed Care – PPO | Source: Ambulatory Visit | Attending: Oncology | Admitting: Oncology

## 2013-09-04 ENCOUNTER — Encounter: Payer: Self-pay | Admitting: Oncology

## 2013-09-04 ENCOUNTER — Other Ambulatory Visit (HOSPITAL_BASED_OUTPATIENT_CLINIC_OR_DEPARTMENT_OTHER): Payer: BC Managed Care – PPO

## 2013-09-04 ENCOUNTER — Other Ambulatory Visit: Payer: BC Managed Care – PPO

## 2013-09-04 ENCOUNTER — Ambulatory Visit (HOSPITAL_BASED_OUTPATIENT_CLINIC_OR_DEPARTMENT_OTHER): Payer: BC Managed Care – PPO

## 2013-09-04 ENCOUNTER — Encounter: Payer: Self-pay | Admitting: Physician Assistant

## 2013-09-04 VITALS — BP 106/66 | HR 89 | Temp 98.8°F | Resp 18 | Ht 61.0 in | Wt 156.0 lb

## 2013-09-04 DIAGNOSIS — C50419 Malignant neoplasm of upper-outer quadrant of unspecified female breast: Secondary | ICD-10-CM

## 2013-09-04 DIAGNOSIS — Z170 Estrogen receptor positive status [ER+]: Secondary | ICD-10-CM

## 2013-09-04 DIAGNOSIS — D649 Anemia, unspecified: Secondary | ICD-10-CM

## 2013-09-04 DIAGNOSIS — C50919 Malignant neoplasm of unspecified site of unspecified female breast: Secondary | ICD-10-CM

## 2013-09-04 DIAGNOSIS — T451X5A Adverse effect of antineoplastic and immunosuppressive drugs, initial encounter: Secondary | ICD-10-CM

## 2013-09-04 DIAGNOSIS — D696 Thrombocytopenia, unspecified: Secondary | ICD-10-CM

## 2013-09-04 DIAGNOSIS — E860 Dehydration: Secondary | ICD-10-CM

## 2013-09-04 DIAGNOSIS — E876 Hypokalemia: Secondary | ICD-10-CM

## 2013-09-04 DIAGNOSIS — B351 Tinea unguium: Secondary | ICD-10-CM

## 2013-09-04 DIAGNOSIS — C50412 Malignant neoplasm of upper-outer quadrant of left female breast: Secondary | ICD-10-CM

## 2013-09-04 DIAGNOSIS — D6481 Anemia due to antineoplastic chemotherapy: Secondary | ICD-10-CM

## 2013-09-04 DIAGNOSIS — R197 Diarrhea, unspecified: Secondary | ICD-10-CM

## 2013-09-04 LAB — CBC WITH DIFFERENTIAL/PLATELET
BASO%: 0.2 % (ref 0.0–2.0)
Basophils Absolute: 0 10*3/uL (ref 0.0–0.1)
EOS%: 0 % (ref 0.0–7.0)
Eosinophils Absolute: 0 10*3/uL (ref 0.0–0.5)
HCT: 26.7 % — ABNORMAL LOW (ref 34.8–46.6)
HGB: 8.6 g/dL — ABNORMAL LOW (ref 11.6–15.9)
LYMPH%: 13.6 % — ABNORMAL LOW (ref 14.0–49.7)
MCH: 30 pg (ref 25.1–34.0)
MCHC: 32.1 g/dL (ref 31.5–36.0)
MCV: 93.4 fL (ref 79.5–101.0)
MONO#: 1 10*3/uL — ABNORMAL HIGH (ref 0.1–0.9)
MONO%: 8.6 % (ref 0.0–14.0)
NEUT#: 8.7 10*3/uL — ABNORMAL HIGH (ref 1.5–6.5)
NEUT%: 77.6 % — ABNORMAL HIGH (ref 38.4–76.8)
Platelets: 46 10*3/uL — ABNORMAL LOW (ref 145–400)
RBC: 2.86 10*6/uL — ABNORMAL LOW (ref 3.70–5.45)
RDW: 16.7 % — ABNORMAL HIGH (ref 11.2–14.5)
WBC: 11.2 10*3/uL — ABNORMAL HIGH (ref 3.9–10.3)
lymph#: 1.5 10*3/uL (ref 0.9–3.3)

## 2013-09-04 LAB — COMPREHENSIVE METABOLIC PANEL (CC13)
ALT: 27 U/L (ref 0–55)
AST: 22 U/L (ref 5–34)
Albumin: 3.3 g/dL — ABNORMAL LOW (ref 3.5–5.0)
Alkaline Phosphatase: 70 U/L (ref 40–150)
Anion Gap: 5 mEq/L (ref 3–11)
BUN: 5.8 mg/dL — ABNORMAL LOW (ref 7.0–26.0)
CO2: 31 mEq/L — ABNORMAL HIGH (ref 22–29)
Calcium: 8.3 mg/dL — ABNORMAL LOW (ref 8.4–10.4)
Chloride: 107 mEq/L (ref 98–109)
Creatinine: 0.7 mg/dL (ref 0.6–1.1)
Glucose: 101 mg/dl (ref 70–140)
Potassium: 3.4 mEq/L — ABNORMAL LOW (ref 3.5–5.1)
Sodium: 143 mEq/L (ref 136–145)
Total Bilirubin: 0.3 mg/dL (ref 0.20–1.20)
Total Protein: 5.8 g/dL — ABNORMAL LOW (ref 6.4–8.3)

## 2013-09-04 LAB — HOLD TUBE, BLOOD BANK

## 2013-09-04 LAB — MAGNESIUM (CC13): Magnesium: 1 mg/dl — CL (ref 1.5–2.5)

## 2013-09-04 MED ORDER — CICLOPIROX 8 % EX SOLN: Freq: Every day | CUTANEOUS | Status: DC

## 2013-09-04 MED ORDER — SODIUM CHLORIDE 0.9 % IJ SOLN
10.0000 mL | INTRAMUSCULAR | Status: DC | PRN
  Administered 2013-09-04: 10 mL via INTRAVENOUS
  Filled 2013-09-04: qty 10

## 2013-09-04 MED ORDER — HEPARIN SOD (PORK) LOCK FLUSH 100 UNIT/ML IV SOLN
500.0000 [IU] | Freq: Once | INTRAVENOUS | Status: AC
  Administered 2013-09-04: 500 [IU] via INTRAVENOUS
  Filled 2013-09-04: qty 5

## 2013-09-04 MED ORDER — SODIUM CHLORIDE 0.9 % IV SOLN
INTRAVENOUS | Status: DC
  Administered 2013-09-04: 15:00:00 via INTRAVENOUS
  Filled 2013-09-04: qty 250

## 2013-09-04 NOTE — Telephone Encounter (Signed)
appts made and printed. Pt is aware that tx will be added. i emailed MW to add the tx...td 

## 2013-09-04 NOTE — Patient Instructions (Signed)
Hypomagnesemia Magnesium is a common ion (mineral) in the body which is needed for metabolism. It is about how the body handles food and other chemical reactions necessary for life. Only about 2% of the magnesium in our body is found in the blood. When this is low, it is called hypomagnesemia. The blood will measure only a tiny amount of the magnesium in our body. When it is low in our blood, it does not mean that the whole body supply is low. The normal serum concentration ranges from 1.8-2.5 mEq/L. When the level gets to be less than 1.0 mEq/L, a number of problems begin to happen.  CAUSES   Receiving intravenous fluids without magnesium replacement.  Loss of magnesium from the bowel by naso-gastric suction.  Loss of magnesium from nausea and vomiting or severe diarrhea. Any of the inflammatory bowel conditions can cause this.  Abuse of alcohol often leads to low serum magnesium.  An inherited form of magnesium loss happens when the kidneys lose magnesium. This is called familial or primary hypomagnesemia.  Some medications such as diuretics also cause the loss of magnesium. SYMPTOMS  These following problems are worse if the changes in magnesium levels come on suddenly.  Tremor.  Confusion.  Muscle weakness.  Over-sensitive to sights and sounds.  Sensitive reflexes.  Depression.  Muscular fibrillations.  Over-reactivity of the nerves.  Irritability.  Psychosis.  Spasms of the hand muscles.  Tetany (where the muscles go into uncontrollable spasms). DIAGNOSIS  This condition can be diagnosed by blood tests. TREATMENT   In emergency, magnesium can be given intravenously (by vein).  If the condition is less worrisome, it can be corrected by diet. High levels of magnesium are found in green leafy vegetables, peas, beans and nuts among other things. It can also be given through medications by mouth.  If it is being caused by medications, changes can be made.  If  alcohol is a problem, help is available if there are difficulties giving it up. Document Released: 03/18/2005 Document Revised: 09/14/2011 Document Reviewed: 02/10/2008 [employer] Patient Information 2014 [employer].        

## 2013-09-04 NOTE — Progress Notes (Signed)
ID: [name] [name] OB: June 04, 1961  MR#: [id number]  CSN#:[id number]  PCP: Harlow Asa, MD ; Sherie Don, NP GYN:   Conley Simmonds, MD  SU: Ovidio Kin, MD RADONC:  Lurline Hare, MD OTHER MD: Regino Schultze, MD;  Estelle Grumbles, MD  CHIEF COMPLAINT:  Left Breast Cancer/Neoadjuvant Chemo   BREAST CANCER HISTORY: [name] [name] palpated a mass in her left breast in late August 2014. She was already scheduled for screening mammography at [facility] and this was performed 03/13/2013. Indeed a possible mass was noted in the left breast and on 03/29/2013 the patient underwent diagnostic left mammography and left ultrasonography. This showed a 2.5 cm irregular mass in the upper outer quadrant of the left breast. This was palpable.by ultrasound there was a 1.6 cm irregular hypoechoic mass in the area in question, and in addition to enlarged left axillary lymph nodes were noted, the largest measuring 2.5 cm.  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  Bilateral breast MRI 04/11/2013 shows 3 abnormal enhancing masses in the upper outer quadrant of the left breast measuring altogether 6.4 cm. The largest separate mass measured 2.7 cm. There were abnormal or large lymph nodes in the left axilla. Ultrasound guided biopsy of the 2 smaller masses has been scheduled.  The patient's subsequent history is as detailed below   INTERVAL HISTORY: [name] [name] returns today for followup of her left breast cancer, as well as reevaluation of her diarrhea, dehydration, hypokalemia, and hypomagnesemia. She's currently day 15 cycle 6 of neoadjuvant docetaxel/cyclophosphamide plus  trastuzumab/pertuzumab given every 3 weeks. The pertuzumab has been held after 4 cycles of secondary to severe diarrhea which is gradually improving.   [name] [name] is using both Questran and Imodium on a daily basis, and although her bowel movements are still informed, there are no longer watery.  she is averaging 2-3 stools daily. She denies any blood or mucus in the stool. She's had no abdominal cramping. She denies any fevers of 100 or above. Fortunately, she also denies any problems with nausea whatsoever. Her appetite has improved, and she is trying to keep herself well hydrated. She continues on oral potassium supplement, currently 20 mEq 3 times daily.   Other than the diarrhea, her biggest complaint today are nailbed changes, with both the toenails and fingernails being thickened, loosened, and tender to touch. There is now some evidence of malodorous drainage indicating a likely fungal infection. She still soaking her nails once or twice daily with a solution of half water/half hydrogen peroxide. Recall that [name] [name] is on Celexa, 60 mg daily, which limits which oral antifungals she can take due to a possible interaction.  I will mention that she met with her surgeon last week, Dr. Ezzard Standing, and is hoping to proceed with a lumpectomy and axillary node dissection. This is in the process of being scheduled.   REVIEW OF SYSTEMS: [name] has had no chills or night sweats.  she is still extremely tired and has shortness of breath with minimal activity. She feels like her heart is racing at times. She denies any signs of abnormal bleeding whatsoever. She's had no rashes or skin changes other than the nailbed  changes detailed above. She's had no cough, phlegm production or pleurisy. She denies any orthopnea or peripheral swelling, and has had no chest pain or pressure. She's had no recent headaches, but continues to have a runny nose and some sinus congestion. She's had no dizziness or change in vision.  Currently, she denies any unusual myalgias, arthralgias, bony pain, or muscle cramping. She does have some residual numbness in the very tips of her fingers and tips of her toes. This numbness is intermittent, and not affecting any of her day-to-day activities.  A detailed review of systems is otherwise stable and noncontributory.  PAST MEDICAL HISTORY: Past Medical History  Diagnosis Date  . Chest pain 2009    Consultation-[facility], negative chest CT; nl echo in 2005; h/o palpitations  . Palpitations   . Degenerative joint disease     + degenerative joint disease of the lumbosacral spine  . Colitis 2010    not IBD  . Herpes simplex type II infection   . Allergic rhinitis   . Hypercholesteremia     "slightly high"  . Anxiety   . Heart murmur     "small"  . GERD (gastroesophageal reflux disease)     "a little"  . Breast cancer 03/31/13    left  . Cancer   . Depression   . Hypertension   . Anemia, unspecified 05/30/2013    PAST SURGICAL HISTORY: Past Surgical History  Procedure Laterality Date  . Colonoscopy  10/2010    proctitis; melanosis coli  . Tubal ligation    . Breast excisional biopsy  04/2003    Left; benign disease  . Right oophorectomy  03/13/2007  . Abdominal hysterectomy  03/13/2007    TAH ?BSO--Dr Cheron Every, [place]  . Eye surgery Left     "fix lazy eye"  . Portacath placement Right 04/21/2013    Procedure: INSERTION PORT-A-CATH;  Surgeon: Kandis Cocking, MD;  Location: [facility];  Service: General;  Laterality: Right;    FAMILY HISTORY Family History  Problem Relation Age of Onset  . Aneurysm Mother     Cerebral  . Hypertension Mother   . Hyperlipidemia Mother   . Stroke Mother   . Coronary artery disease Father   . Hypertension Father   . Hyperlipidemia Father   . Lung cancer Brother   . Cancer Brother     Lung  The patient's father died at the age of 83 from a myocardial infarction. The patient's mother died at the age of 16 from a stroke. The  patient had 6 brothers, 2 sisters. One brother has a history of lung cancer in the setting of tobacco abuse. Another brother had multiple myeloma. There is no history of breast or ovarian cancer in the family  GYNECOLOGIC HISTORY:  Menarche age 26, first live birth age 76, the patient is GX P4. She status post abdominal hysterectomy with bilateral salpingo-oophorectomy. She never took hormone replacement.   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 Her husband [name] works in Designer, fashion/clothing. Daughter Candida Peeling lives at home with the patient. She is not employed. Son Louetta Zieske lives in [place] and is not employed. Daughter [name] lives in [place] and is a Community education officer. Daughter [name] is a Child psychotherapist in [place]. The patient has 10 grandchildren. She attends the [employer]     ADVANCED DIRECTIVES: Not in place   HEALTH  MAINTENANCE:  (Updated February 2015)  History  Substance Use Topics  . Smoking status: Former Smoker -- 0.50 packs/day for 15 years    Types: Cigarettes    Quit date: 07/06/1982  . Smokeless tobacco: Never Used  . Alcohol Use: No     Colonoscopy:2012  PAP:2014   Bone density: Never  Lipid panel: Not on file    Allergies  Allergen Reactions  . Doxycycline Other (See Comments)    Unknown    Current Outpatient Prescriptions  Medication Sig Dispense Refill  . ALPRAZolam (XANAX) 1 MG tablet TAKE 1 TABLET BY MOUTH 3 TIMES A DAY AS NEEDED FOR ANXIETY  90 tablet  0  . atenolol (TENORMIN) 50 MG tablet TAKE 1 TABLET BY MOUTH EVERY DAY FOR PALPITATION  90 tablet  1  . cholestyramine (QUESTRAN) 4 G packet Take 1 packet (4 g total) by mouth 2 (two) times daily as needed (diarrhea).  30 each  1  . citalopram (CELEXA) 40 MG tablet Take 40 mg by mouth every evening.      . diphenhydramine-acetaminophen (TYLENOL PM) 25-500 MG TABS Take 2 tablets by  mouth at bedtime.      . lidocaine-prilocaine (EMLA) cream Apply topically as needed.  30 g  0  . loperamide (IMODIUM) 2 MG capsule Take 2 mg by mouth as needed for diarrhea or loose stools.      . potassium chloride (K-DUR) 10 MEQ tablet Take 2 tablets (20 mEq total) by mouth 3 (three) times daily. X 7 days.  Then 2 tabs by mouth twice daily or as directed  180 tablet  1  . valACYclovir (VALTREX) 1000 MG tablet Take 1,000 mg by mouth daily.      . ciclopirox (PENLAC) 8 % solution Apply topically at bedtime. Apply over nail & skin daily. Apply over previous coat x 7 days, remove with alcohol and continue.  6.6 mL  1  . clindamycin (CLINDAGEL) 1 % gel Apply topically 2 (two) times daily.  30 g  1  . dexamethasone (DECADRON) 4 MG tablet Take 2 tablets (8 mg total) by mouth 2 (two) times daily with a meal. Take two times a day the day before Taxotere. Then take two times a day starting the day after chemo for 3 days.  30 tablet  1  . LORazepam (ATIVAN) 0.5 MG tablet TAKE 1 TABLET EVERY 6 HOURS AS NEEDED FOR NAUSEA OR VOMITING  30 tablet  0  . nystatin (MYCOSTATIN) 100000 UNIT/ML suspension       . ondansetron (ZOFRAN) 8 MG tablet Take 1 tablet (8 mg total) by mouth 2 (two) times daily. Take two times a day starting the day after chemo for 3 days. Then take two times a day as needed for nausea or vomiting.  30 tablet  1  . prochlorperazine (COMPAZINE) 10 MG tablet Take 1 tablet (10 mg total) by mouth every 6 (six) hours as needed (Nausea or vomiting).  30 tablet  1  . [DISCONTINUED] potassium chloride SA (K-DUR,KLOR-CON) 20 MEQ tablet Take 1 tablet (20 mEq total) by mouth 2 (two) times daily.  20 tablet  1   Current Facility-Administered Medications  Medication Dose Route Frequency Provider Last Rate Last Dose  . sodium chloride 0.9 % 250 mL with magnesium sulfate 5 g infusion   Intravenous Continuous Pranavi Aure Allegra Grana, PA-C        OBJECTIVE: Middle-aged [place] American woman who appears tired but is in no  acute distress Filed Vitals:  09/04/13 1324  BP: 106/66  Pulse: 89  Temp: 98.8 F (37.1 C)  Resp: 18  Body mass index is 29.49 kg/(m^2).  ECOG: 1 Filed Weights   09/04/13 1324  Weight: 156 lb (70.761 kg)   Physical Exam: HEENT:  Sclerae anicteric.  Oropharynx clear with no evidence of ulcerations or candidiasis. Buccal mucosa is slightly pale and dry. Neck is supple, trachea midline.  NODES:  No cervical or supraclavicular lymphadenopathy palpated.  BREAST EXAM:  Deferred. Axillae are benign bilaterally, with no palpable lymphadenopathy. LUNGS:  Clear to auscultation bilaterally with fair excursion. No dullness to percussion.  No wheezes or rhonchi HEART:  Regular rate and rhythm.  ABDOMEN:  Soft, nontender.  Positive, hyperactive bowel sounds.  MSK:  No focal spinal tenderness to palpation. Good range of motion bilaterally in the upper extremities. EXTREMITIES:  No peripheral edema.  No lymphedema noted. SKIN: Significant nail dyscrasia consistent with onychomycosis noted bilaterally. There is darkening of the nail, loosening from the nailbed, and obvious drainage from some of the fingernails. Skin is mildly hyperpigmented, but otherwise clear with no additional rashes noted. NEURO:  Nonfocal. Well oriented.  Fatigued affect.      LAB RESULTS:   Lab Results  Component Value Date   WBC 11.2* 09/04/2013   NEUTROABS 8.7* 09/04/2013   HGB 8.6* 09/04/2013   HCT 26.7* 09/04/2013   MCV 93.4 09/04/2013   PLT 46* 09/04/2013      Chemistry      Component Value Date/Time   NA 143 09/04/2013 1313   NA 139 06/06/2008 0545   K 3.4* 09/04/2013 1313   K 3.8 06/06/2008 0545   CL 104 06/06/2008 0545   CO2 31* 09/04/2013 1313   CO2 26 06/06/2008 0545   BUN 5.8* 09/04/2013 1313   BUN 6 06/06/2008 0545   CREATININE 0.7 09/04/2013 1313   CREATININE 0.77 06/06/2008 0545      Component Value Date/Time   CALCIUM 8.3* 09/04/2013 1313   CALCIUM 8.7 06/06/2008 0545   ALKPHOS 70 09/04/2013 1313   ALKPHOS 62  04/12/2008 1725   AST 22 09/04/2013 1313   AST 21 04/12/2008 1725   ALT 27 09/04/2013 1313   ALT 28 04/12/2008 1725   BILITOT 0.30 09/04/2013 1313   BILITOT 0.5 04/12/2008 1725     Magnesium  1.0  09/04/2013    1.4  08/29/2013    STUDIES:   Echocardiogram on 08/15/2013 showed an ejection fraction of 55-60%.  This will be due to be repeated again in mid May 2015.   Mr Breast Bilateral W Wo Contrast 08/28/2013   CLINICAL DATA:  Known left breast grade 2-3 invasive ductal carcinoma. Assess response to neoadjuvant chemotherapy.  EXAM: BILATERAL BREAST MRI WITH AND WITHOUT CONTRAST  TECHNIQUE: Multiplanar, multisequence MR images of both breasts were obtained prior to and following the intravenous administration of 14ml of MultiHance  THREE-DIMENSIONAL MR IMAGE RENDERING ON INDEPENDENT WORKSTATION:  Three-dimensional MR images were rendered by post-processing of the original MR data on an independent workstation. The three-dimensional MR images were interpreted, and findings are reported in the following complete MRI report for this study. Three dimensional images were evaluated at the independent DynaCad workstation  COMPARISON:  Previous MR's of the breast dated 06/12/2013 and 04/11/2013.  FINDINGS: Breast composition:  B:  scattered fibroglandular  Background parenchymal enhancement: Mild to moderate  Right breast: No mass or abnormal enhancement.  Left breast: The previously seen areas of enhancement located within the upper outer quadrant of  the left breast are no longer visible.  Lymph nodes: The multiple previously seen enlarged left axillary lymph nodes have considerably decreased in size with the largest remaining lymph node visible measuring 7 mm in size.  Ancillary findings:  None.  IMPRESSION: Excellent response to neoadjuvant chemotherapy with no significant residual enhancing masses visible within the breast. Also, the left axillary lymph nodes have markedly decreased in size.  RECOMMENDATION:  Treatment plan.  BI-RADS CATEGORY  6: Known biopsy-proven malignancy - appropriate action should be taken.   Electronically Signed   By: Anselmo Pickler M.D.   On: 08/28/2013 11:27     ASSESSMENT/PLAN: 53 y.o. [place], [place] woman \  (1)  status post left breast biopsy 03/29/2013 for a clinical T2-T3 pN1, stage IIB-IIIA invasive ductal carcinoma, grade 2-3, estrogen receptor 39% positive (with moderate staining intensity), progesterone receptor negative, with an MIB-1 of 83%, and HER-2 amplified by CISH, with a ratio of 7.5 and 15 HER-2 copies per cell  (2) receiving neoadjuvant chemotherapy, with trastuzumab/ pertuzumab/ carboplatin/docetaxel started 05/01/2013, to be repeated every 3 weeks x6. Pertuzumab  held after 4 cycles of secondary to diarrhea. The patient [facility] day [facility] per her request. Echocardiogram to be repeated on 08/15/2013.  Trastuzumab to be continued for total of one year.   (3) Diarrhea most likely secondary to chemotherapy (Pertuzumab), resulting in dehydration -- receiving supportive IV fluids, improving    (4) skin rash most likely secondary to pertuzumab: Resolved.   (5) chemotherapy-induced anemia: Iron Indices are normal. now more symptomatic, and being scheduled for a blood transfusion later this week on 09/07/2013.   (6)  hypokalemia, currently on 20 mEq by mouth twice a day, being increased to 3 times a day and also being given IV  (7)  Hypomagnesemia -- receiving IV supplementation    PLAN: Over half of our 40 minute appointment today was spent in addressing the patient's concerns, answering her questions, discussing treatment options for her current issues, and coordinating care.  I have reviewed Lezlee's case with Dr. Darnelle Catalan, including her current labs. She will receive  IV magnesium sulfate today. Specifically, she will receive 250 mL of normal saline with a total of 5 g of magnesium sulfate IV, given at a rate of 50 mL per  hour (2 g of magnesium per hour). She will continue on her oral potassium at her current dose, 20 mEq 3 times daily.  We will draw blood for a type and cross today, and she is being scheduled to return later this week on March 5 for a blood transfusion, 2 units of packed red blood cells. I will recheck her metabolic panel and her magnesium level at that time as well.   [name] [name] was encouraged to keep herself well hydrated, and will use both Questran and Imodium as needed to maintain regular bowel movements. If the diarrhea worsens, she will let us know. Likewise, she will contact us with any fevers of 100 or above.  We will continue to follow [name] [name] closely with weekly labs and with weekly IV infusions with supportive IV fluids plus  magnesium/potassium as needed.  She will be due for her first single agent dose of q. three-week trastuzumab next Monday, March 9, and that is being scheduled for her as well. I will see her when she returns for labs on March 23, then again prior to her next dose of trastuzumab on March 30. As noted above, she is in the process of  scheduling her surgery, and we can reschedule any of these appointments as necessary once that is coordinated.  With regards to her onychomycosis, we're going to treat this topically with Penlac solution. This will be applied nightly to her nails and the surrounding skin. She will remove this every 7 days with alcohol and "start over", continuing this regimen until the nails improve. If we do not see improvement in the next few weeks, I will refer her to dermatology for further evaluation. Again, there is some limitation to the oral antifungals she can take due to her current 60 mg dose of Celexa which I do not want to change.  All this was reviewed in detail with the patient today, and she was given this information in writing as well. [name] [name] voices her understanding and agreement with the above, and knows to calls with any changes or  problems.   Dillard Essex  Medical Oncology   09/04/2013 3:20 PM

## 2013-09-04 NOTE — Telephone Encounter (Signed)
Per staff message and POF I have scheduled appts. Advised scheduler to move lab apapts JMW

## 2013-09-05 ENCOUNTER — Telehealth: Payer: Self-pay | Admitting: Oncology

## 2013-09-07 ENCOUNTER — Telehealth: Payer: Self-pay | Admitting: *Deleted

## 2013-09-07 ENCOUNTER — Ambulatory Visit (HOSPITAL_BASED_OUTPATIENT_CLINIC_OR_DEPARTMENT_OTHER): Payer: BC Managed Care – PPO

## 2013-09-07 ENCOUNTER — Encounter: Payer: Self-pay | Admitting: Physician Assistant

## 2013-09-07 ENCOUNTER — Other Ambulatory Visit: Payer: Self-pay | Admitting: Physician Assistant

## 2013-09-07 ENCOUNTER — Other Ambulatory Visit: Payer: Self-pay | Admitting: *Deleted

## 2013-09-07 ENCOUNTER — Other Ambulatory Visit (HOSPITAL_BASED_OUTPATIENT_CLINIC_OR_DEPARTMENT_OTHER): Payer: BC Managed Care – PPO

## 2013-09-07 VITALS — BP 104/55 | HR 70 | Temp 97.6°F | Resp 18

## 2013-09-07 DIAGNOSIS — C50419 Malignant neoplasm of upper-outer quadrant of unspecified female breast: Secondary | ICD-10-CM

## 2013-09-07 DIAGNOSIS — C50412 Malignant neoplasm of upper-outer quadrant of left female breast: Secondary | ICD-10-CM

## 2013-09-07 DIAGNOSIS — R319 Hematuria, unspecified: Secondary | ICD-10-CM

## 2013-09-07 DIAGNOSIS — D649 Anemia, unspecified: Secondary | ICD-10-CM

## 2013-09-07 DIAGNOSIS — E876 Hypokalemia: Secondary | ICD-10-CM

## 2013-09-07 LAB — URINALYSIS, MICROSCOPIC - CHCC
Bilirubin (Urine): NEGATIVE
Glucose: NEGATIVE mg/dL
Ketones: NEGATIVE mg/dL
Nitrite: NEGATIVE
Protein: 30 mg/dL
Specific Gravity, Urine: 1.015 (ref 1.003–1.035)
Urobilinogen, UR: 0.2 mg/dL (ref 0.2–1)
pH: 6 (ref 4.6–8.0)

## 2013-09-07 LAB — COMPREHENSIVE METABOLIC PANEL (CC13)
ALT: 61 U/L — ABNORMAL HIGH (ref 0–55)
AST: 47 U/L — ABNORMAL HIGH (ref 5–34)
Albumin: 3.3 g/dL — ABNORMAL LOW (ref 3.5–5.0)
Alkaline Phosphatase: 65 U/L (ref 40–150)
Anion Gap: 10 mEq/L (ref 3–11)
BUN: 5.3 mg/dL — ABNORMAL LOW (ref 7.0–26.0)
CO2: 29 mEq/L (ref 22–29)
Calcium: 9.1 mg/dL (ref 8.4–10.4)
Chloride: 107 mEq/L (ref 98–109)
Creatinine: 0.8 mg/dL (ref 0.6–1.1)
Glucose: 127 mg/dl (ref 70–140)
Potassium: 3.6 mEq/L (ref 3.5–5.1)
Sodium: 145 mEq/L (ref 136–145)
Total Bilirubin: 0.28 mg/dL (ref 0.20–1.20)
Total Protein: 6.4 g/dL (ref 6.4–8.3)

## 2013-09-07 LAB — CBC WITH DIFFERENTIAL/PLATELET
BASO%: 0.1 % (ref 0.0–2.0)
Basophils Absolute: 0 10*3/uL (ref 0.0–0.1)
EOS%: 0 % (ref 0.0–7.0)
Eosinophils Absolute: 0 10*3/uL (ref 0.0–0.5)
HCT: 26.9 % — ABNORMAL LOW (ref 34.8–46.6)
HGB: 8.9 g/dL — ABNORMAL LOW (ref 11.6–15.9)
LYMPH%: 24.4 % (ref 14.0–49.7)
MCH: 30.2 pg (ref 25.1–34.0)
MCHC: 33.1 g/dL (ref 31.5–36.0)
MCV: 91.2 fL (ref 79.5–101.0)
MONO#: 0.7 10*3/uL (ref 0.1–0.9)
MONO%: 10.3 % (ref 0.0–14.0)
NEUT#: 4.4 10*3/uL (ref 1.5–6.5)
NEUT%: 65.2 % (ref 38.4–76.8)
Platelets: 41 10*3/uL — ABNORMAL LOW (ref 145–400)
RBC: 2.95 10*6/uL — ABNORMAL LOW (ref 3.70–5.45)
RDW: 16.4 % — ABNORMAL HIGH (ref 11.2–14.5)
WBC: 6.7 10*3/uL (ref 3.9–10.3)
lymph#: 1.6 10*3/uL (ref 0.9–3.3)
nRBC: 0 % (ref 0–0)

## 2013-09-07 LAB — PREPARE RBC (CROSSMATCH)

## 2013-09-07 LAB — MAGNESIUM (CC13): Magnesium: 1.2 mg/dl — CL (ref 1.5–2.5)

## 2013-09-07 MED ORDER — ACETAMINOPHEN 325 MG PO TABS
ORAL_TABLET | ORAL | Status: AC
  Filled 2013-09-07: qty 2

## 2013-09-07 MED ORDER — HEPARIN SOD (PORK) LOCK FLUSH 100 UNIT/ML IV SOLN
500.0000 [IU] | Freq: Every day | INTRAVENOUS | Status: AC | PRN
  Administered 2013-09-07: 500 [IU]
  Filled 2013-09-07: qty 5

## 2013-09-07 MED ORDER — SODIUM CHLORIDE 0.9 % IV SOLN
INTRAVENOUS | Status: DC
  Administered 2013-09-07: 14:00:00 via INTRAVENOUS
  Filled 2013-09-07: qty 500

## 2013-09-07 MED ORDER — DIPHENHYDRAMINE HCL 25 MG PO CAPS
ORAL_CAPSULE | ORAL | Status: AC
  Filled 2013-09-07: qty 1

## 2013-09-07 MED ORDER — DIPHENHYDRAMINE HCL 25 MG PO CAPS
25.0000 mg | ORAL_CAPSULE | Freq: Once | ORAL | Status: AC
  Administered 2013-09-07: 25 mg via ORAL

## 2013-09-07 MED ORDER — SODIUM CHLORIDE 0.9 % IV SOLN
250.0000 mL | Freq: Once | INTRAVENOUS | Status: AC
  Administered 2013-09-07: 250 mL via INTRAVENOUS

## 2013-09-07 MED ORDER — SODIUM CHLORIDE 0.9 % IJ SOLN
3.0000 mL | INTRAMUSCULAR | Status: DC | PRN
  Filled 2013-09-07: qty 10

## 2013-09-07 MED ORDER — SODIUM CHLORIDE 0.9 % IJ SOLN
10.0000 mL | INTRAMUSCULAR | Status: AC | PRN
  Administered 2013-09-07: 10 mL
  Filled 2013-09-07: qty 10

## 2013-09-07 MED ORDER — ACETAMINOPHEN 325 MG PO TABS
650.0000 mg | ORAL_TABLET | Freq: Once | ORAL | Status: AC
  Administered 2013-09-07: 650 mg via ORAL

## 2013-09-07 NOTE — Progress Notes (Signed)
Patient states she has had hematuria for two days, states her temperature has been around 99.5 and having chills on and off. Patient complains of intermittent pain in her lower back occasionally, declines pain at time of assessment. Patient verbalized she is to tell an RN if she develops pain during this visit. Triage nurse aware of hematuria and talked to Dr. Jana Hakim.

## 2013-09-07 NOTE — Patient Instructions (Addendum)
Blood Transfusion Information WHAT IS A BLOOD TRANSFUSION? A transfusion is the replacement of blood or some of its parts. Blood is made up of multiple cells which provide different functions.  Red blood cells carry oxygen and are used for blood loss replacement.  White blood cells fight against infection.  Platelets control bleeding.  Plasma helps clot blood.  Other blood products are available for specialized needs, such as hemophilia or other clotting disorders. BEFORE THE TRANSFUSION  Who gives blood for transfusions?   You may be able to donate blood to be used at a later date on yourself (autologous donation).  Relatives can be asked to donate blood. This is generally not any safer than if you have received blood from a stranger. The same precautions are taken to ensure safety when a relative's blood is donated.  Healthy volunteers who are fully evaluated to make sure their blood is safe. This is blood bank blood. Transfusion therapy is the safest it has ever been in the practice of medicine. Before blood is taken from a donor, a complete history is taken to make sure that person has no history of diseases nor engages in risky social behavior (examples are intravenous drug use or sexual activity with multiple partners). The donor's travel history is screened to minimize risk of transmitting infections, such as malaria. The donated blood is tested for signs of infectious diseases, such as HIV and hepatitis. The blood is then tested to be sure it is compatible with you in order to minimize the chance of a transfusion reaction. If you or a relative donates blood, this is often done in anticipation of surgery and is not appropriate for emergency situations. It takes many days to process the donated blood. RISKS AND COMPLICATIONS Although transfusion therapy is very safe and saves many lives, the main dangers of transfusion include:   Getting an infectious disease.  Developing a  transfusion reaction. This is an allergic reaction to something in the blood you were given. Every precaution is taken to prevent this. The decision to have a blood transfusion has been considered carefully by your caregiver before blood is given. Blood is not given unless the benefits outweigh the risks. AFTER THE TRANSFUSION  Right after receiving a blood transfusion, you will usually feel much better and more energetic. This is especially true if your red blood cells have gotten low (anemic). The transfusion raises the level of the red blood cells which carry oxygen, and this usually causes an energy increase.  The nurse administering the transfusion will monitor you carefully for complications. HOME CARE INSTRUCTIONS  No special instructions are needed after a transfusion. You may find your energy is better. Speak with your caregiver about any limitations on activity for underlying diseases you may have. SEEK MEDICAL CARE IF:   Your condition is not improving after your transfusion.  You develop redness or irritation at the intravenous (IV) site. SEEK IMMEDIATE MEDICAL CARE IF:  Any of the following symptoms occur over the next 12 hours:  Shaking chills.  You have a temperature by mouth above 102 F (38.9 C), not controlled by medicine.  Chest, back, or muscle pain.  People around you feel you are not acting correctly or are confused.  Shortness of breath or difficulty breathing.  Dizziness and fainting.  You get a rash or develop hives.  You have a decrease in urine output.  Your urine turns a dark color or changes to pink, red, or brown. Any of the following   symptoms occur over the next 10 days:  You have a temperature by mouth above 102 F (38.9 C), not controlled by medicine.  Shortness of breath.  Weakness after normal activity.  The white part of the eye turns yellow (jaundice).  You have a decrease in the amount of urine or are urinating less often.  Your  urine turns a dark color or changes to pink, red, or brown. Document Released: 06/19/2000 Document Revised: 09/14/2011 Document Reviewed: 02/06/2008 [facility] Patient Information 2014 [place].   Hypomagnesemia Magnesium is a common ion (mineral) in the body which is needed for metabolism. It is about how the body handles food and other chemical reactions necessary for life. Only about 2% of the magnesium in our body is found in the blood. When this is low, it is called hypomagnesemia. The blood will measure only a tiny amount of the magnesium in our body. When it is low in our blood, it does not mean that the whole body supply is low. The normal serum concentration ranges from 1.8-2.5 mEq/L. When the level gets to be less than 1.0 mEq/L, a number of problems begin to happen.  CAUSES   Receiving intravenous fluids without magnesium replacement.  Loss of magnesium from the bowel by naso-gastric suction.  Loss of magnesium from nausea and vomiting or severe diarrhea. Any of the inflammatory bowel conditions can cause this.  Abuse of alcohol often leads to low serum magnesium.  An inherited form of magnesium loss happens when the kidneys lose magnesium. This is called familial or primary hypomagnesemia.  Some medications such as diuretics also cause the loss of magnesium. SYMPTOMS  These following problems are worse if the changes in magnesium levels come on suddenly.  Tremor.  Confusion.  Muscle weakness.  Over-sensitive to sights and sounds.  Sensitive reflexes.  Depression.  Muscular fibrillations.  Over-reactivity of the nerves.  Irritability.  Psychosis.  Spasms of the hand muscles.  Tetany (where the muscles go into uncontrollable spasms). DIAGNOSIS  This condition can be diagnosed by blood tests. TREATMENT   In emergency, magnesium can be given intravenously (by vein).  If the condition is less worrisome, it can be corrected by diet. High levels of  magnesium are found in green leafy vegetables, peas, beans and nuts among other things. It can also be given through medications by mouth.  If it is being caused by medications, changes can be made.  If alcohol is a problem, help is available if there are difficulties giving it up. Document Released: 03/18/2005 Document Revised: 09/14/2011 Document Reviewed: 02/10/2008 [facility] Patient Information 2014 [place].

## 2013-09-07 NOTE — Telephone Encounter (Signed)
In office today for lab/blood transfusion and fills out "walk-in" form for hematuria. No burning with urination or fever. Is having some back pain over her kidney area (bilateral). Last platelet count was 46,000 on 09/04/13. Will add U/A and culture today and CBC-collaborative nurse made aware. Instructed patient could be UTI or just low platelet count.

## 2013-09-07 NOTE — Progress Notes (Signed)
Shayonna received 2 units of red blood cells today for symptomatic anemia. During her visit we also obtain labs. Her magnesium is still low at 1.2 today, up only slightly from 1.0 earlier this week on 09/04/2013. This was reviewed with our pharmacists, and she was given 2 g of magnesium sulfate IV today during her blood transfusion.  I will also note that she has thrombocytopenia with platelet count today of 41,000. She has complained of some hematuria which was evaluated with a urinalysis today. This did show gross blood, but no evidence of infection or UTI. This will be evaluated further with a blood culture.  All this has been reviewed with Meggin today and she is aware of her low platelet count. She also knows to report to the emergency room if she has any uncontrollable bleeding. Otherwise, she will return on Monday, March 9, for a followup visit in routine labs.  She is due for her single agent dose of trastuzumab that day, but I will likely lengthen her infusion appointment with the expectation that she will possibly need supportive fluids, and possibly IV magnesium and/or potassium supplementation.  Micah Flesher, PA-C 09/07/2013

## 2013-09-08 ENCOUNTER — Other Ambulatory Visit (INDEPENDENT_AMBULATORY_CARE_PROVIDER_SITE_OTHER): Payer: Self-pay | Admitting: Surgery

## 2013-09-08 ENCOUNTER — Telehealth: Payer: Self-pay | Admitting: *Deleted

## 2013-09-08 ENCOUNTER — Encounter (HOSPITAL_BASED_OUTPATIENT_CLINIC_OR_DEPARTMENT_OTHER): Payer: Self-pay | Admitting: *Deleted

## 2013-09-08 DIAGNOSIS — C50412 Malignant neoplasm of upper-outer quadrant of left female breast: Secondary | ICD-10-CM

## 2013-09-08 LAB — TYPE AND SCREEN
ABO/RH(D): O POS
Antibody Screen: NEGATIVE
Unit division: 0
Unit division: 0

## 2013-09-08 NOTE — Telephone Encounter (Signed)
Called pt to follow up. Pt has low platelets. Pt denies any unusual bleeding or bruising. Pt states, " I feel a little tired, but not too bad", and thanked [place] for the call. Pt's next appt is Mar.9th will have repeat labs.Message to be forwarded to [employer], PA-C.

## 2013-09-08 NOTE — Progress Notes (Signed)
Pt finishing up chemo-anemia-had 2 units PRBC 09/07/13-hgb prior 8.6-post 8.9-having reck cc 09/11/13 Called [facility]-no bed requested and having axillary node dissection-to bring all meds and overnight bag

## 2013-09-11 ENCOUNTER — Other Ambulatory Visit: Payer: Self-pay | Admitting: Physician Assistant

## 2013-09-11 ENCOUNTER — Other Ambulatory Visit (HOSPITAL_BASED_OUTPATIENT_CLINIC_OR_DEPARTMENT_OTHER): Payer: BC Managed Care – PPO

## 2013-09-11 ENCOUNTER — Ambulatory Visit (HOSPITAL_BASED_OUTPATIENT_CLINIC_OR_DEPARTMENT_OTHER): Payer: BC Managed Care – PPO

## 2013-09-11 VITALS — BP 117/54 | HR 79 | Temp 98.0°F | Resp 20

## 2013-09-11 DIAGNOSIS — C50919 Malignant neoplasm of unspecified site of unspecified female breast: Secondary | ICD-10-CM

## 2013-09-11 DIAGNOSIS — C50412 Malignant neoplasm of upper-outer quadrant of left female breast: Secondary | ICD-10-CM

## 2013-09-11 DIAGNOSIS — C770 Secondary and unspecified malignant neoplasm of lymph nodes of head, face and neck: Secondary | ICD-10-CM

## 2013-09-11 DIAGNOSIS — E860 Dehydration: Secondary | ICD-10-CM

## 2013-09-11 DIAGNOSIS — R197 Diarrhea, unspecified: Secondary | ICD-10-CM

## 2013-09-11 DIAGNOSIS — Z5112 Encounter for antineoplastic immunotherapy: Secondary | ICD-10-CM

## 2013-09-11 LAB — MAGNESIUM (CC13): Magnesium: 1.1 mg/dl — CL (ref 1.5–2.5)

## 2013-09-11 LAB — COMPREHENSIVE METABOLIC PANEL (CC13)
ALT: 40 U/L (ref 0–55)
AST: 23 U/L (ref 5–34)
Albumin: 3.3 g/dL — ABNORMAL LOW (ref 3.5–5.0)
Alkaline Phosphatase: 51 U/L (ref 40–150)
Anion Gap: 8 mEq/L (ref 3–11)
BUN: 9.8 mg/dL (ref 7.0–26.0)
CO2: 28 mEq/L (ref 22–29)
Calcium: 8.9 mg/dL (ref 8.4–10.4)
Chloride: 107 mEq/L (ref 98–109)
Creatinine: 0.8 mg/dL (ref 0.6–1.1)
Glucose: 84 mg/dl (ref 70–140)
Potassium: 4 mEq/L (ref 3.5–5.1)
Sodium: 143 mEq/L (ref 136–145)
Total Bilirubin: 0.44 mg/dL (ref 0.20–1.20)
Total Protein: 6.2 g/dL — ABNORMAL LOW (ref 6.4–8.3)

## 2013-09-11 LAB — CBC WITH DIFFERENTIAL/PLATELET
BASO%: 0.5 % (ref 0.0–2.0)
Basophils Absolute: 0 10*3/uL (ref 0.0–0.1)
EOS%: 0 % (ref 0.0–7.0)
Eosinophils Absolute: 0 10*3/uL (ref 0.0–0.5)
HCT: 34.4 % — ABNORMAL LOW (ref 34.8–46.6)
HGB: 11.5 g/dL — ABNORMAL LOW (ref 11.6–15.9)
LYMPH%: 25.9 % (ref 14.0–49.7)
MCH: 31.2 pg (ref 25.1–34.0)
MCHC: 33.3 g/dL (ref 31.5–36.0)
MCV: 93.6 fL (ref 79.5–101.0)
MONO#: 0.7 10*3/uL (ref 0.1–0.9)
MONO%: 13.3 % (ref 0.0–14.0)
NEUT#: 3.4 10*3/uL (ref 1.5–6.5)
NEUT%: 60.3 % (ref 38.4–76.8)
Platelets: 61 10*3/uL — ABNORMAL LOW (ref 145–400)
RBC: 3.68 10*6/uL — ABNORMAL LOW (ref 3.70–5.45)
RDW: 16.7 % — ABNORMAL HIGH (ref 11.2–14.5)
WBC: 5.6 10*3/uL (ref 3.9–10.3)
lymph#: 1.5 10*3/uL (ref 0.9–3.3)

## 2013-09-11 MED ORDER — SODIUM CHLORIDE 0.9 % IJ SOLN
10.0000 mL | INTRAMUSCULAR | Status: DC | PRN
  Administered 2013-09-11: 10 mL
  Filled 2013-09-11: qty 10

## 2013-09-11 MED ORDER — TRASTUZUMAB CHEMO INJECTION 440 MG
6.0000 mg/kg | Freq: Once | INTRAVENOUS | Status: AC
  Administered 2013-09-11: 420 mg via INTRAVENOUS
  Filled 2013-09-11: qty 20

## 2013-09-11 MED ORDER — ACETAMINOPHEN 325 MG PO TABS
ORAL_TABLET | ORAL | Status: AC
  Filled 2013-09-11: qty 2

## 2013-09-11 MED ORDER — HEPARIN SOD (PORK) LOCK FLUSH 100 UNIT/ML IV SOLN
500.0000 [IU] | Freq: Once | INTRAVENOUS | Status: AC | PRN
  Administered 2013-09-11: 500 [IU]
  Filled 2013-09-11: qty 5

## 2013-09-11 MED ORDER — DIPHENHYDRAMINE HCL 25 MG PO CAPS
ORAL_CAPSULE | ORAL | Status: AC
  Filled 2013-09-11: qty 2

## 2013-09-11 MED ORDER — ACETAMINOPHEN 325 MG PO TABS
650.0000 mg | ORAL_TABLET | Freq: Once | ORAL | Status: AC
  Administered 2013-09-11: 650 mg via ORAL

## 2013-09-11 MED ORDER — DIPHENHYDRAMINE HCL 25 MG PO CAPS
50.0000 mg | ORAL_CAPSULE | Freq: Once | ORAL | Status: AC
  Administered 2013-09-11: 50 mg via ORAL

## 2013-09-11 MED ORDER — SODIUM CHLORIDE 0.9 % IV SOLN
INTRAVENOUS | Status: DC
  Administered 2013-09-11: 11:00:00 via INTRAVENOUS
  Filled 2013-09-11: qty 1000

## 2013-09-11 MED ORDER — SODIUM CHLORIDE 0.9 % IV SOLN
Freq: Once | INTRAVENOUS | Status: AC
  Administered 2013-09-11: 10:00:00 via INTRAVENOUS

## 2013-09-11 NOTE — Progress Notes (Signed)
0930: plt 61 today. Dr. Jana Hakim aware. OK to proceed.  0942: mag level 1.1 reported to Micah Flesher, PA. This RN to await further orders.

## 2013-09-11 NOTE — Progress Notes (Signed)
[name] [name] received her first dose of q. three-week single agent trastuzumab today. While here, we checked her labs. Her potassium has recovered nicely at 4.0, but her magnesium is still low today at 1.1. She will receive her trastuzumab infusion, then will receive additional IV fluids along with 8 g of magnesium sulfate. This will be given in a total of 1000 mL normal saline over 4 hours (2 g per hour). This has been calculated with our pharmacist.  She scheduled for her definitive surgery tomorrow, March 10. Of course that we'll be following her labs during hospitalization on sure. Otherwise, she scheduled to return here on Monday, 09/18/2013, for repeat labs and possible IV fluids. We will give her IV potassium and magnesium as needed until these levels have normalized. Of course we'll continue to follow her hemoglobin for her history of anemia as well.  She is scheduled to see me for routine followup, labs, and IV fluids again in 2 weeks, 09/25/2013.  Micah Flesher, PA-C 09/11/2013

## 2013-09-11 NOTE — Patient Instructions (Signed)
[facility] Discharge Instructions for Patients Receiving Chemotherapy  Today you received the following chemotherapy agent: Herceptin   To help prevent nausea and vomiting after your treatment, we encourage you to take your nausea medication as prescribed.    If you develop nausea and vomiting that is not controlled by your nausea medication, call the clinic.   BELOW ARE SYMPTOMS THAT SHOULD BE REPORTED IMMEDIATELY:  *FEVER GREATER THAN 100.5 F  *CHILLS WITH OR WITHOUT FEVER  NAUSEA AND VOMITING THAT IS NOT CONTROLLED WITH YOUR NAUSEA MEDICATION  *UNUSUAL SHORTNESS OF BREATH  *UNUSUAL BRUISING OR BLEEDING  TENDERNESS IN MOUTH AND THROAT WITH OR WITHOUT PRESENCE OF ULCERS  *URINARY PROBLEMS  *BOWEL PROBLEMS  UNUSUAL RASH Items with * indicate a potential emergency and should be followed up as soon as possible.  Feel free to call the clinic you have any questions or concerns. The clinic phone number is [phone].   Hypomagnesemia Magnesium is a common ion (mineral) in the body which is needed for metabolism. It is about how the body handles food and other chemical reactions necessary for life. Only about 2% of the magnesium in our body is found in the blood. When this is low, it is called hypomagnesemia. The blood will measure only a tiny amount of the magnesium in our body. When it is low in our blood, it does not mean that the whole body supply is low. The normal serum concentration ranges from 1.8-2.5 mEq/L. When the level gets to be less than 1.0 mEq/L, a number of problems begin to happen.  CAUSES   Receiving intravenous fluids without magnesium replacement.  Loss of magnesium from the bowel by naso-gastric suction.  Loss of magnesium from nausea and vomiting or severe diarrhea. Any of the inflammatory bowel conditions can cause this.  Abuse of alcohol often leads to low serum magnesium.  An inherited form of magnesium loss happens when the kidneys  lose magnesium. This is called familial or primary hypomagnesemia.  Some medications such as diuretics also cause the loss of magnesium. SYMPTOMS  These following problems are worse if the changes in magnesium levels come on suddenly.  Tremor.  Confusion.  Muscle weakness.  Over-sensitive to sights and sounds.  Sensitive reflexes.  Depression.  Muscular fibrillations.  Over-reactivity of the nerves.  Irritability.  Psychosis.  Spasms of the hand muscles.  Tetany (where the muscles go into uncontrollable spasms). DIAGNOSIS  This condition can be diagnosed by blood tests. TREATMENT   In emergency, magnesium can be given intravenously (by vein).  If the condition is less worrisome, it can be corrected by diet. High levels of magnesium are found in green leafy vegetables, peas, beans and nuts among other things. It can also be given through medications by mouth.  If it is being caused by medications, changes can be made.  If alcohol is a problem, help is available if there are difficulties giving it up. Document Released: 03/18/2005 Document Revised: 09/14/2011 Document Reviewed: 02/10/2008 [facility] Patient Information 2014 [place].

## 2013-09-12 ENCOUNTER — Ambulatory Visit (HOSPITAL_BASED_OUTPATIENT_CLINIC_OR_DEPARTMENT_OTHER)
Admission: RE | Admit: 2013-09-12 | Discharge: 2013-09-13 | Disposition: A | Discharge status: Home / Self Care | Payer: BC Managed Care – PPO | Source: Ambulatory Visit | Attending: Surgery | Admitting: Surgery

## 2013-09-12 ENCOUNTER — Ambulatory Visit (HOSPITAL_BASED_OUTPATIENT_CLINIC_OR_DEPARTMENT_OTHER): Payer: BC Managed Care – PPO | Admitting: Anesthesiology

## 2013-09-12 ENCOUNTER — Encounter (HOSPITAL_BASED_OUTPATIENT_CLINIC_OR_DEPARTMENT_OTHER): Payer: Self-pay | Admitting: *Deleted

## 2013-09-12 ENCOUNTER — Ambulatory Visit
Admission: RE | Admit: 2013-09-12 | Discharge: 2013-09-12 | Disposition: A | Discharge status: Home / Self Care | Payer: BC Managed Care – PPO | Source: Ambulatory Visit | Attending: Surgery | Admitting: Surgery

## 2013-09-12 ENCOUNTER — Encounter (HOSPITAL_BASED_OUTPATIENT_CLINIC_OR_DEPARTMENT_OTHER): Payer: BC Managed Care – PPO | Admitting: Anesthesiology

## 2013-09-12 ENCOUNTER — Other Ambulatory Visit (INDEPENDENT_AMBULATORY_CARE_PROVIDER_SITE_OTHER): Payer: Self-pay | Admitting: Surgery

## 2013-09-12 ENCOUNTER — Ambulatory Visit
Admission: RE | Admit: 2013-09-12 | Discharge: 2013-09-12 | Disposition: A | Discharge status: Home / Self Care | Payer: Self-pay | Source: Ambulatory Visit | Attending: Surgery | Admitting: Surgery

## 2013-09-12 ENCOUNTER — Encounter (HOSPITAL_BASED_OUTPATIENT_CLINIC_OR_DEPARTMENT_OTHER)
Admission: RE | Disposition: A | Discharge status: Home / Self Care | Payer: Self-pay | Source: Ambulatory Visit | Attending: Surgery

## 2013-09-12 DIAGNOSIS — C50412 Malignant neoplasm of upper-outer quadrant of left female breast: Secondary | ICD-10-CM

## 2013-09-12 DIAGNOSIS — C50411 Malignant neoplasm of upper-outer quadrant of right female breast: Secondary | ICD-10-CM | POA: Diagnosis present

## 2013-09-12 DIAGNOSIS — R002 Palpitations: Secondary | ICD-10-CM | POA: Insufficient documentation

## 2013-09-12 DIAGNOSIS — M169 Osteoarthritis of hip, unspecified: Secondary | ICD-10-CM | POA: Insufficient documentation

## 2013-09-12 DIAGNOSIS — D0590 Unspecified type of carcinoma in situ of unspecified breast: Secondary | ICD-10-CM

## 2013-09-12 DIAGNOSIS — F411 Generalized anxiety disorder: Secondary | ICD-10-CM | POA: Insufficient documentation

## 2013-09-12 DIAGNOSIS — M1610 Unilateral primary osteoarthritis, unspecified hip: Secondary | ICD-10-CM | POA: Insufficient documentation

## 2013-09-12 HISTORY — DX: Presence of spectacles and contact lenses: Z97.3

## 2013-09-12 HISTORY — PX: BREAST LUMPECTOMY WITH NEEDLE LOCALIZATION AND AXILLARY LYMPH NODE DISSECTION: SHX5758

## 2013-09-12 LAB — POCT HEMOGLOBIN-HEMACUE: Hemoglobin: 12.8 g/dL (ref 12.0–15.0)

## 2013-09-12 SURGERY — BREAST LUMPECTOMY WITH NEEDLE LOCALIZATION AND AXILLARY LYMPH NODE DISSECTION
Anesthesia: General | Site: Breast | Laterality: Left

## 2013-09-12 MED ORDER — FENTANYL CITRATE 0.05 MG/ML IJ SOLN
INTRAMUSCULAR | Status: AC
  Filled 2013-09-12: qty 8

## 2013-09-12 MED ORDER — ALPRAZOLAM 1 MG PO TABS
1.0000 mg | ORAL_TABLET | Freq: Three times a day (TID) | ORAL | Status: DC | PRN
  Administered 2013-09-12: 1 mg via ORAL

## 2013-09-12 MED ORDER — MIDAZOLAM HCL 2 MG/2ML IJ SOLN
INTRAMUSCULAR | Status: AC
  Filled 2013-09-12: qty 2

## 2013-09-12 MED ORDER — ONDANSETRON HCL 4 MG/2ML IJ SOLN: 4.0000 mg | Freq: Four times a day (QID) | INTRAMUSCULAR | Status: DC | PRN

## 2013-09-12 MED ORDER — PROPOFOL 10 MG/ML IV BOLUS
INTRAVENOUS | Status: DC | PRN
  Administered 2013-09-12: 200 mg via INTRAVENOUS

## 2013-09-12 MED ORDER — HEPARIN SODIUM (PORCINE) 5000 UNIT/ML IJ SOLN: 5000.0000 [IU] | Freq: Three times a day (TID) | INTRAMUSCULAR | Status: DC

## 2013-09-12 MED ORDER — OXYCODONE HCL 5 MG/5ML PO SOLN: 5.0000 mg | Freq: Once | ORAL | Status: DC | PRN

## 2013-09-12 MED ORDER — MIDAZOLAM HCL 2 MG/2ML IJ SOLN
1.0000 mg | INTRAMUSCULAR | Status: DC | PRN
  Administered 2013-09-12: 2 mg via INTRAVENOUS

## 2013-09-12 MED ORDER — LIDOCAINE HCL (CARDIAC) 20 MG/ML IV SOLN
INTRAVENOUS | Status: DC | PRN
  Administered 2013-09-12: 100 mg via INTRAVENOUS

## 2013-09-12 MED ORDER — FENTANYL CITRATE 0.05 MG/ML IJ SOLN
INTRAMUSCULAR | Status: DC | PRN
  Administered 2013-09-12: 25 ug via INTRAVENOUS
  Administered 2013-09-12: 100 ug via INTRAVENOUS
  Administered 2013-09-12: 25 ug via INTRAVENOUS

## 2013-09-12 MED ORDER — MIDAZOLAM HCL 5 MG/5ML IJ SOLN
INTRAMUSCULAR | Status: DC | PRN
  Administered 2013-09-12 (×2): 1 mg via INTRAVENOUS

## 2013-09-12 MED ORDER — ATENOLOL 50 MG PO TABS
50.0000 mg | ORAL_TABLET | Freq: Every day | ORAL | Status: DC
  Administered 2013-09-12: 50 mg via ORAL

## 2013-09-12 MED ORDER — DEXAMETHASONE SODIUM PHOSPHATE 4 MG/ML IJ SOLN
INTRAMUSCULAR | Status: DC | PRN
  Administered 2013-09-12: 10 mg via INTRAVENOUS

## 2013-09-12 MED ORDER — BUPIVACAINE-EPINEPHRINE PF 0.5-1:200000 % IJ SOLN
INTRAMUSCULAR | Status: DC | PRN
  Administered 2013-09-12: 25 mL via PERINEURAL

## 2013-09-12 MED ORDER — CEFAZOLIN SODIUM-DEXTROSE 2-3 GM-% IV SOLR
INTRAVENOUS | Status: AC
  Filled 2013-09-12: qty 50

## 2013-09-12 MED ORDER — ONDANSETRON HCL 4 MG/2ML IJ SOLN
INTRAMUSCULAR | Status: DC | PRN
  Administered 2013-09-12: 4 mg via INTRAVENOUS

## 2013-09-12 MED ORDER — OXYCODONE HCL 5 MG PO TABS: 5.0000 mg | ORAL_TABLET | Freq: Once | ORAL | Status: DC | PRN

## 2013-09-12 MED ORDER — LACTATED RINGERS IV SOLN
INTRAVENOUS | Status: DC
  Administered 2013-09-12 (×2): via INTRAVENOUS

## 2013-09-12 MED ORDER — HYDROMORPHONE HCL PF 1 MG/ML IJ SOLN
INTRAMUSCULAR | Status: AC
  Filled 2013-09-12: qty 1

## 2013-09-12 MED ORDER — VALACYCLOVIR HCL 500 MG PO TABS
1000.0000 mg | ORAL_TABLET | Freq: Every day | ORAL | Status: DC
  Administered 2013-09-12: 1000 mg via ORAL

## 2013-09-12 MED ORDER — CITALOPRAM HYDROBROMIDE 40 MG PO TABS
40.0000 mg | ORAL_TABLET | Freq: Every evening | ORAL | Status: DC
  Administered 2013-09-12: 40 mg via ORAL

## 2013-09-12 MED ORDER — CHLORHEXIDINE GLUCONATE 4 % EX LIQD: 1.0000 "application " | Freq: Once | CUTANEOUS | Status: DC

## 2013-09-12 MED ORDER — HYDROMORPHONE HCL PF 1 MG/ML IJ SOLN
0.2500 mg | INTRAMUSCULAR | Status: DC | PRN
Start: 2013-09-12 — End: 2013-09-12
  Administered 2013-09-12 (×2): 0.5 mg via INTRAVENOUS

## 2013-09-12 MED ORDER — ONDANSETRON HCL 4 MG/2ML IJ SOLN: 4.0000 mg | Freq: Once | INTRAMUSCULAR | Status: DC | PRN

## 2013-09-12 MED ORDER — FENTANYL CITRATE 0.05 MG/ML IJ SOLN
50.0000 ug | INTRAMUSCULAR | Status: DC | PRN
  Administered 2013-09-12: 100 ug via INTRAVENOUS

## 2013-09-12 MED ORDER — NYSTATIN 100000 UNIT/ML MT SUSP: 5.0000 mL | Freq: Four times a day (QID) | OROMUCOSAL | Status: DC

## 2013-09-12 MED ORDER — KCL IN DEXTROSE-NACL 20-5-0.45 MEQ/L-%-% IV SOLN
INTRAVENOUS | Status: DC
  Administered 2013-09-12: 18:00:00 via INTRAVENOUS
  Filled 2013-09-12: qty 1000

## 2013-09-12 MED ORDER — MORPHINE SULFATE 2 MG/ML IJ SOLN: 1.0000 mg | INTRAMUSCULAR | Status: DC | PRN

## 2013-09-12 MED ORDER — FENTANYL CITRATE 0.05 MG/ML IJ SOLN
INTRAMUSCULAR | Status: AC
  Filled 2013-09-12: qty 2

## 2013-09-12 MED ORDER — BUPIVACAINE HCL (PF) 0.25 % IJ SOLN
INTRAMUSCULAR | Status: DC | PRN
  Administered 2013-09-12: 15 mL

## 2013-09-12 MED ORDER — DEXAMETHASONE 4 MG PO TABS: 8.0000 mg | ORAL_TABLET | Freq: Two times a day (BID) | ORAL | Status: DC

## 2013-09-12 MED ORDER — BUPIVACAINE HCL (PF) 0.25 % IJ SOLN
INTRAMUSCULAR | Status: AC
  Filled 2013-09-12: qty 30

## 2013-09-12 MED ORDER — ONDANSETRON HCL 4 MG PO TABS: 4.0000 mg | ORAL_TABLET | Freq: Four times a day (QID) | ORAL | Status: DC | PRN

## 2013-09-12 MED ORDER — HYDROCODONE-ACETAMINOPHEN 5-325 MG PO TABS
1.0000 | ORAL_TABLET | ORAL | Status: DC | PRN
  Administered 2013-09-12 – 2013-09-13 (×2): 2 via ORAL
  Filled 2013-09-12 (×2): qty 2

## 2013-09-12 MED ORDER — CEFAZOLIN SODIUM-DEXTROSE 2-3 GM-% IV SOLR
2.0000 g | INTRAVENOUS | Status: AC
  Administered 2013-09-12: 2 g via INTRAVENOUS

## 2013-09-12 MED ORDER — CHOLESTYRAMINE 4 G PO PACK: 4.0000 g | PACK | Freq: Two times a day (BID) | ORAL | Status: DC | PRN

## 2013-09-12 SURGICAL SUPPLY — 68 items
ADH SKN CLS APL DERMABOND .7 (GAUZE/BANDAGES/DRESSINGS) ×1
APL SKNCLS STERI-STRIP NONHPOA (GAUZE/BANDAGES/DRESSINGS)
APPLIER CLIP 11 MED OPEN (CLIP) ×2
APR CLP MED 11 20 MLT OPN (CLIP) ×1
BENZOIN TINCTURE PRP APPL 2/3 (GAUZE/BANDAGES/DRESSINGS) IMPLANT
BINDER BREAST LRG (GAUZE/BANDAGES/DRESSINGS) IMPLANT
BINDER BREAST MEDIUM (GAUZE/BANDAGES/DRESSINGS) IMPLANT
BINDER BREAST XLRG (GAUZE/BANDAGES/DRESSINGS) ×2 IMPLANT
BINDER BREAST XXLRG (GAUZE/BANDAGES/DRESSINGS) IMPLANT
BIOPATCH RED 1 DISK 7.0 (GAUZE/BANDAGES/DRESSINGS) ×2 IMPLANT
BLADE SURG 10 STRL SS (BLADE) ×2 IMPLANT
BLADE SURG 15 STRL LF DISP TIS (BLADE) ×1 IMPLANT
BLADE SURG 15 STRL SS (BLADE) ×2
CANISTER SUCT 1200ML W/VALVE (MISCELLANEOUS) IMPLANT
CANISTER SUCTION 1200CC (MISCELLANEOUS) ×2 IMPLANT
CHLORAPREP W/TINT 26ML (MISCELLANEOUS) ×2 IMPLANT
CLIP APPLIE 11 MED OPEN (CLIP) ×1 IMPLANT
CLIP TI MEDIUM 6 (CLIP) ×2 IMPLANT
CLIP TI WIDE RED SMALL 6 (CLIP) ×2 IMPLANT
COVER MAYO STAND STRL (DRAPES) ×2 IMPLANT
COVER PROBE 5X48 (MISCELLANEOUS)
COVER TABLE BACK 60X90 (DRAPES) ×2 IMPLANT
DECANTER SPIKE VIAL GLASS SM (MISCELLANEOUS) IMPLANT
DERMABOND ADVANCED (GAUZE/BANDAGES/DRESSINGS) ×1
DERMABOND ADVANCED .7 DNX12 (GAUZE/BANDAGES/DRESSINGS) ×1 IMPLANT
DEVICE DUBIN W/COMP PLATE 8390 (MISCELLANEOUS) ×2 IMPLANT
DRAIN CHANNEL 19F RND (DRAIN) ×2 IMPLANT
DRAIN HEMOVAC 1/8 X 5 (WOUND CARE) IMPLANT
DRAPE LAPAROSCOPIC ABDOMINAL (DRAPES) ×2 IMPLANT
DRAPE UTILITY XL STRL (DRAPES) ×2 IMPLANT
DRSG TEGADERM 2-3/8X2-3/4 SM (GAUZE/BANDAGES/DRESSINGS) ×2 IMPLANT
ELECT COATED BLADE 2.86 ST (ELECTRODE) ×2 IMPLANT
ELECT REM PT RETURN 9FT ADLT (ELECTROSURGICAL) ×2
ELECTRODE REM PT RTRN 9FT ADLT (ELECTROSURGICAL) ×1 IMPLANT
EVACUATOR SILICONE 100CC (DRAIN) ×2 IMPLANT
GAUZE SPONGE 4X4 16PLY XRAY LF (GAUZE/BANDAGES/DRESSINGS) IMPLANT
GLOVE BIO SURGEON STRL SZ 6.5 (GLOVE) ×2 IMPLANT
GLOVE SURG SIGNA 7.5 PF LTX (GLOVE) ×4 IMPLANT
GOWN STRL REUS W/ TWL LRG LVL3 (GOWN DISPOSABLE) ×2 IMPLANT
GOWN STRL REUS W/TWL LRG LVL3 (GOWN DISPOSABLE) ×4
KIT CVR 48X5XPRB PLUP LF (MISCELLANEOUS) IMPLANT
KIT MARKER MARGIN INK (KITS) ×2 IMPLANT
NEEDLE HYPO 25X1 1.5 SAFETY (NEEDLE) ×2 IMPLANT
NS IRRIG 1000ML POUR BTL (IV SOLUTION) ×2 IMPLANT
PACK BASIN DAY SURGERY FS (CUSTOM PROCEDURE TRAY) ×2 IMPLANT
PENCIL BUTTON HOLSTER BLD 10FT (ELECTRODE) ×2 IMPLANT
PIN SAFETY STERILE (MISCELLANEOUS) ×2 IMPLANT
SHEET MEDIUM DRAPE 40X70 STRL (DRAPES) ×2 IMPLANT
SLEEVE SCD COMPRESS KNEE MED (MISCELLANEOUS) ×2 IMPLANT
SPONGE GAUZE 4X4 12PLY (GAUZE/BANDAGES/DRESSINGS) ×4 IMPLANT
SPONGE GAUZE 4X4 12PLY STER LF (GAUZE/BANDAGES/DRESSINGS) IMPLANT
SPONGE LAP 18X18 X RAY DECT (DISPOSABLE) ×4 IMPLANT
STAPLER VISISTAT 35W (STAPLE) IMPLANT
STRIP CLOSURE SKIN 1/2X4 (GAUZE/BANDAGES/DRESSINGS) IMPLANT
SUT ETHILON 2 0 FS 18 (SUTURE) ×2 IMPLANT
SUT MON AB 5-0 PS2 18 (SUTURE) ×2 IMPLANT
SUT SILK 3 0 TIES 17X18 (SUTURE)
SUT SILK 3-0 18XBRD TIE BLK (SUTURE) IMPLANT
SUT VIC AB 5-0 PS2 18 (SUTURE) IMPLANT
SUT VICRYL 3-0 CR8 SH (SUTURE) ×4 IMPLANT
SUT VICRYL AB 2 0 TIE (SUTURE) IMPLANT
SUT VICRYL AB 2 0 TIES (SUTURE)
SYR BULB 3OZ (MISCELLANEOUS) ×2 IMPLANT
SYR CONTROL 10ML LL (SYRINGE) ×2 IMPLANT
TOWEL OR 17X24 6PK STRL BLUE (TOWEL DISPOSABLE) ×2 IMPLANT
TOWEL OR NON WOVEN STRL DISP B (DISPOSABLE) IMPLANT
TUBE CONNECTING 20X1/4 (TUBING) ×2 IMPLANT
YANKAUER SUCT BULB TIP NO VENT (SUCTIONS) ×2 IMPLANT

## 2013-09-12 NOTE — Anesthesia Preprocedure Evaluation (Signed)
Anesthesia Evaluation  Patient identified by MRN, date of birth, ID band Patient awake    Reviewed: Allergy & Precautions, H&P , NPO status , Patient's Chart, lab work & pertinent test results  Airway Mallampati: I TM Distance: >3 FB Neck ROM: Full    Dental  (+) Teeth Intact, Dental Advisory Given   Pulmonary former smoker,  breath sounds clear to auscultation        Cardiovascular hypertension, Pt. on medications and Pt. on home beta blockers Rhythm:Regular Rate:Normal     Neuro/Psych    GI/Hepatic GERD-  Medicated and Controlled,  Endo/Other    Renal/GU      Musculoskeletal   Abdominal   Peds  Hematology   Anesthesia Other Findings   Reproductive/Obstetrics                           Anesthesia Physical Anesthesia Plan  ASA: II  Anesthesia Plan: General   Post-op Pain Management: MAC Combined w/ Regional for Post-op pain   Induction: Intravenous  Airway Management Planned: LMA  Additional Equipment:   Intra-op Plan:   Post-operative Plan: Extubation in OR  Informed Consent: I have reviewed the patients History and Physical, chart, labs and discussed the procedure including the risks, benefits and alternatives for the proposed anesthesia with the patient or authorized representative who has indicated his/her understanding and acceptance.   Dental advisory given  Plan Discussed with: CRNA, Anesthesiologist and Surgeon  Anesthesia Plan Comments:         Anesthesia Quick Evaluation

## 2013-09-12 NOTE — Progress Notes (Signed)
Assisted Dr. Crews with left, ultrasound guided, pectoralis block. Side rails up, monitors on throughout procedure. See vital signs in flow sheet. Tolerated Procedure well. 

## 2013-09-12 NOTE — Anesthesia Procedure Notes (Signed)
Anesthesia Regional Block:  Pectoralis block  Pre-Anesthetic Checklist: ,, timeout performed, Correct Patient, Correct Site, Correct Laterality, Correct Procedure, Correct Position, site marked, Risks and benefits discussed,  Surgical consent,  Pre-op evaluation,  At surgeon's request and post-op pain management  Laterality: Left and Upper  Prep: chloraprep       Needles:  Injection technique: Single-shot  Needle Type: Echogenic Needle     Needle Length: 9cm 9 cm Needle Gauge: 21 and 21 G    Additional Needles:  Procedures: ultrasound guided (picture in chart) Pectoralis block Narrative:  Start time: 09/12/2013 11:54 AM End time: 09/12/2013 12:04 PM Injection made incrementally with aspirations every 5 mL.  Performed by: Personally  Anesthesiologist: Lorrene Reid, MD

## 2013-09-12 NOTE — Anesthesia Postprocedure Evaluation (Signed)
  Anesthesia Post-op Note  Patient: [name] [name]  Procedure(s) Performed: Procedure(s) with comments: BREAST LUMPECTOMY WITH NEEDLE LOCALIZATION AND AXILLARY LYMPH NODE DISSECTION (Left) - and axilla  Patient Location: PACU  Anesthesia Type:General  Level of Consciousness: awake, alert  and oriented  Airway and Oxygen Therapy: Patient Spontanous Breathing  Post-op Pain: none  Post-op Assessment: Post-op Vital signs reviewed  Post-op Vital Signs: Reviewed  Complications: No apparent anesthesia complications

## 2013-09-12 NOTE — H&P (View-Only) (Signed)
Re:   [name] [name] DOB:   Sep 06, 1960 MRN:   [id number]  [facility]  ASSESSMENT AND PLAN: 1.  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 SLNBx depending on trial.  Receiving neoadjuvant chemotherapy, with trastuzumab/ pertuzumab/ carboplatin/docetaxel started 05/01/2013.  Last chemotx 08/15/2013.  MRI 08/28/2013 - shows no residual enhancing mass.  I reviewed the films with Dr. Jean Rosenthal.  She is a candidate for lumpectomy (bracketed), but will need a left axillary node dissection. She had multiple suspicious nodes on her original MRI and still has two areas that are suspicious.  She had left the office, but I talked to her about this plan by phone.  1a.  Power port placed 04/21/2013 - D Enslie Sahota   1b.  Biopsy of superior area of left breast showed fat necrosis - 04/18/2013 2.  Palpitations  Sees Dr. Elvera Lennox. Rothbart 3.  Osteoarthritis of both hips 4.  On Celebrex  To stop this one week before surgery 5.  To stop Premarin  6.  Anxiety 7.  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  Will address this after the end of her treatment for her breast cancer.  She has seen Dr. Michaell Cowing for this.  A CT scan 03/27/2013 showed no direct evidence of a rectovaginal fistula.  Dr. Michaell Cowing has suggested another test to try to "document" the fistula.  That is on hold during for now as she goes through the treatment for breast cancer.    She still has stool from her vagina.  REFERRING PHYSICIAN: Harlow Asa, MD  HISTORY OF PRESENT ILLNESS: [name] [name] is a 53 y.o. (DOB: May 23, 1961)  AA  female whose primary care physician is LUKING,W S, MD and comes for follow up of  left breast cancer.  She comes by herself. She is doing well.  She has tolerated the chemotx fairly well.  She has had some trouble with her fingernails recently.  MRI 08/28/2013 - shows no residual enhancing mass.  I reviewed the MRI with her and gave her a copy.  The report looks encouraging. I spoke briefly to Dr. Michell Heinrich about lumpectomy plans and node dissection plans.  History of breast cancer: She gets a mammogram every year.  She felt a lump in the upper outer part of her left breast.  She has no family history of breast cancer.  She takes a Premarin on a PRN basis.  It sounds like she takes no more that a few tabs a month.  She will stop this.  She had a hysterectomy for fibroids in 2010.  She had a mammogram at [facility] on 03/13/2013 which found a left breast mass.  On 03/29/2013 she had a left breast biopsy.  The biopsy (Accession: [id number]) showed IDC and metastatic carcinoma to a left axillary lymph node. MRI 04/11/2013 shows - 3 abnormal enhancing masses in the upper-outer quadrant of left breast encompassing a total area of the 6.4 x 2.4 x 4 cm as described. The largest, superior and posterior mass was previously biopsied proven cancer. The 2 other smaller masses seen are suspicious for neoplasm.  She is for biopsy of these two  other areas this Friday, 10/9.   Past Medical History  Diagnosis Date  . Chest pain 2009    Consultation-Rothbart, negative chest CT; nl echo in 2005; h/o palpitations  . Palpitations   . Degenerative joint disease     + degenerative joint disease of the lumbosacral spine  . Colitis 2010    not IBD  . Herpes simplex type II infection   . Allergic rhinitis   . Hypercholesteremia     "slightly high"  . Anxiety   . Heart murmur     "small"  . GERD (gastroesophageal reflux disease)     "a little"  . Breast cancer 03/31/13    left  . Cancer   . Depression   . Hypertension   . Anemia, unspecified 05/30/2013     Past Surgical History  Procedure  Laterality Date  . Colonoscopy  10/2010    proctitis; melanosis coli  . Tubal ligation    . Breast excisional biopsy  04/2003    Left; benign disease  . Right oophorectomy  03/13/2007  . Abdominal hysterectomy  03/13/2007    TAH ?BSO--Dr Cheron Every, [place]  . Eye surgery Left     "fix lazy eye"  . Portacath placement Right 04/21/2013    Procedure: INSERTION PORT-A-CATH;  Surgeon: Kandis Cocking, MD;  Location: [facility];  Service: General;  Laterality: Right;      Current Outpatient Prescriptions  Medication Sig Dispense Refill  . ALPRAZolam (XANAX) 1 MG tablet TAKE 1 TABLET BY MOUTH 3 TIMES A DAY AS NEEDED FOR ANXIETY  90 tablet  0  . atenolol (TENORMIN) 50 MG tablet TAKE 1 TABLET BY MOUTH EVERY DAY FOR PALPITATION  90 tablet  1  . cholestyramine (QUESTRAN) 4 G packet Take 1 packet (4 g total) by mouth 2 (two) times daily as needed (diarrhea).  30 each  1  . citalopram (CELEXA) 40 MG tablet Take 40 mg by mouth every evening.      . clindamycin (CLINDAGEL) 1 % gel Apply topically 2 (two) times daily.  30 g  1  . dexamethasone (DECADRON) 4 MG tablet Take 2 tablets (8 mg total) by mouth 2 (two) times daily with a meal. Take two times a day the day before Taxotere. Then take two times a day starting the day after chemo for 3 days.  30 tablet  1  . diphenhydramine-acetaminophen (TYLENOL PM) 25-500 MG TABS Take 2 tablets by mouth at bedtime.      . lidocaine-prilocaine (EMLA) cream Apply topically as needed.  30 g  0  . loperamide (IMODIUM) 2 MG capsule Take 2 mg by mouth as needed for diarrhea or loose stools.      Marland Kitchen LORazepam (ATIVAN) 0.5 MG tablet TAKE 1 TABLET EVERY 6 HOURS AS NEEDED FOR NAUSEA OR VOMITING  30 tablet  0  . ondansetron (ZOFRAN) 8 MG tablet Take 1 tablet (8 mg total) by mouth 2 (two) times daily. Take two times a day starting the day after chemo for 3 days. Then take two times a day as needed for nausea or vomiting.  30 tablet  1  . potassium chloride (K-DUR) 10 MEQ tablet Take 2  tablets (20 mEq total) by mouth 3 (three) times daily. X 7 days.  Then 2 tabs by mouth twice daily or as directed  180 tablet  1  . potassium chloride (K-DUR) 10 MEQ tablet Take 20 mEq by mouth 3 (three) times daily. X7 days and then twice  a day after      . prochlorperazine (COMPAZINE) 10 MG tablet Take 1 tablet (10 mg total) by mouth every 6 (six) hours as needed (Nausea or vomiting).  30 tablet  1  . valACYclovir (VALTREX) 1000 MG tablet Take 1,000 mg by mouth daily.      Marland Kitchen nystatin (MYCOSTATIN) 100000 UNIT/ML suspension       . [DISCONTINUED] potassium chloride SA (K-DUR,KLOR-CON) 20 MEQ tablet Take 1 tablet (20 mEq total) by mouth 2 (two) times daily.  20 tablet  1   No current facility-administered medications for this visit.      Allergies  Allergen Reactions  . Doxycycline Other (See Comments)    Unknown    REVIEW OF SYSTEMS: Cardiac:  Palpitations.  Has seen Dr. Dietrich Pates. Gastrointestinal:  Has seen Dr. Michaell Cowing for possible colo-vesicle fistula, but CT scan was negative.  Had negative colonoscopy by Dr. Karilyn Cota in 11/03/2010.  For now, her fistula evaluation and treatment are on hold. Musculoskeletal:  Osteoarthritis of both hips, on Celebrex.  She cannot remember the name of the orthopedists. Hematologic:  No bleeding disorder.  On Celebrex. Psycho-social:  The patient is oriented.   History of anxiety.  SOCIAL and FAMILY HISTORY: Married. Husband, Christiane Ha. She has 4 children.  3 daughters and 1 son. Ages 59, 32, 23, 28. Works as a Passenger transport manager at [employer]  PHYSICAL EXAM: BP 126/76  Pulse 72  Temp(Src) 98 F (36.7 C)  Resp 18  Ht 5\' 1"  (1.549 m)  Wt 153 lb (69.4 kg)  BMI 28.92 kg/m2  LMP 12/04/2008  General: WN AA F who is alert and generally healthy appearing.  NECK:  Supple.  No thyroid mass. LYMPH NODES:  No cervical, supraclavicular, or axillary adenopathy. BREASTS -  RIGHT:  No palpable mass or nodule.  No nipple discharge.  Port in upper inner  right breast   LEFT:  I cannot feel a specific mass.  But she did not have a specific mass on initial exam. UPPER EXTREMITIES:  No evidence of lymphedema.  DATA REVIEWED: Epic notes and x-ray reports.  Discussed with Dr. Pershing Cox, MD,  [facility] [address] [place]  [address]   [place], [place]    [address] Phone:  [phone] FAX:  [phone]

## 2013-09-12 NOTE — Transfer of Care (Signed)
Immediate Anesthesia Transfer of Care Note  Patient: [name] [name]  Procedure(s) Performed: Procedure(s) with comments: BREAST LUMPECTOMY WITH NEEDLE LOCALIZATION AND AXILLARY LYMPH NODE DISSECTION (Left) - and axilla  Patient Location: PACU  Anesthesia Type:GA combined with regional for post-op pain  Level of Consciousness: awake, sedated and patient cooperative  Airway & Oxygen Therapy: Patient Spontanous Breathing and Patient connected to face mask oxygen  Post-op Assessment: Report given to PACU RN and Post -op Vital signs reviewed and stable  Post vital signs: Reviewed and stable  Complications: No apparent anesthesia complications

## 2013-09-12 NOTE — Op Note (Signed)
09/12/2013  3:28 PM  PATIENT:  [name] [name] DOB: 03/27/1961 MRN: [id number]  PREOP DIAGNOSIS:  left breast cancer  POSTOP DIAGNOSIS:   Left breast cancer, 2 o'clock position (T2, N1 - before chemotherapy)  PROCEDURE:   Procedure(s): BREAST LUMPECTOMY WITH NEEDLE LOCALIZATION x 2 AND Left AXILLARY LYMPH NODE DISSECTION  SURGEON:   Alphonsa Overall, M.D.  ANESTHESIA:   general  Anesthesiologist: Napoleon Form, MD CRNA: Lyndee Leo, CRNA; Tawni Millers, CRNA  General  EBL:  75 cc  ml  DRAINS: 19 F Blake  LOCAL MEDICATIONS USED:   15 cc of 1/4% Marcaine.  Dr. Al Corpus did a pectoral block  SPECIMEN:   Left breast lumpectomy (2 wires), left axillary contents, highest level 2 axillary node  COUNTS CORRECT:  YES  INDICATIONS FOR PROCEDURE:  [name] [name] is a 53 y.o. (DOB: 11/14/1960) AA  female whose primary care physician is Rubbie Battiest, MD and comes for left breast lumpectomy and left axillary lymph node dissection.  She had a positive lymph node biopsy on 03/29/2013 and has undergone neoadjuvant therapy supervised by Dr. Darnell Level. Magrinat.  Her last MRI on 08/28/2013 showed no residual enhancing mass, but there was still 2 suspicious areas in her axilla.   Options for breast cancer treatment were discussed with the patient. She elected to proceed with lumpectomy and axillary lymph node dissection.     The indications and potential complications of surgery were explained to the patient. Potential complications include, but are not limited to, bleeding, infection, the need for further surgery, and nerve injury.     The patient had 2  needle loc wires placed at the 2 o'clock position of the left breast at [facility] by Dr. Hans Eden.   She also had a pectoral block by Dr. Waldo Laine in the holding area.  OPERATIVE NOTE:   The patient was taken to room # 6 at [facility] where she underwent a general anesthesia  supervised by Anesthesiologist: Napoleon Form, MD CRNA: Lyndee Leo, CRNA; Tawni Millers, CRNA. Her left breast and axilla were prepped with  ChloraPrep and sterilely draped.    A time-out and the surgical check list was reviewed.     Because of how close the wires came into the left breast relative to the axilla, I did the left breast lumpectomy first.  Also, because of close proximity, I decided to do the lumpectomy and remove the axillary lymph nodes through a single incision.  The patient had two wires coming out of the 2 o'clock position of the left breast.    I cut down around the wires and tried to take an ellipse of breast tissue around the tumor by at least 1 cm.   I excised this block of breast tissue approximately 9 cm by 7 cm  in diameter. I did take the dissection down to the pectoralis major. I painted the lumpectomy specimen with the 6 color paint kit and did a specimen mammogram which confirmed the mass, clip and the wire were all in the right position.  I also sent the specimen for gross examination.  Dr. Bobbye Riggs thought that I had clear gross margins.   I continued the left axillary dissection through the same incision.  I identified the left axillary vein.  It has two tributaries which came off below.  I divided one of those tributaries.  I identified the long thoracic nerve of Bell and the thoracodorsal nerve during the dissection and  spared these nerves.  I did identify one node behind the pectoralis minor (level 2) and took this node and sent it separately.  The axillary contents were swept out of the axilla and sent to pathology.  Hemostasis was controlled with medium clips and 3-0 vicryl sutures.   I then irrigated the wound with saline. I infiltrated approximately 30 mL of 1% local between the  Incisions.  I placed 6 clips to mark biopsy cavity, at 12, 6, and 9 o'clock. Two clips were placed on the pectoralis major.  I did not put a clip lateral, in that was the axillary dissection.  I placed a 76 F Blake drain in the axilla. I then closed  all the wounds in layers using 3-0 Vicryl sutures for the deep layer. At the skin, I closed the incisions with a 5-0 Monocryl suture. The incision was then painted with Dermabond.  A Biopatch was placed around the drain.  She had gauze place over the wounds and placed in a breast binder.   The patient tolerated the procedure well, was transported to the recovery room in good condition. Sponge and needle count were correct at the end of the case.   Final pathology is pending.  She will spend the night at the [facility].   Alphonsa Overall, MD, [facility] Pager: [phone] Office phone:  [phone]

## 2013-09-12 NOTE — Progress Notes (Signed)
2130: Dressing @ Drain site checked: 1/4 saturated (10 gauze sponges).  Removed & replaced w/2 folded gauzed sponged under drain & 10 gauze sponges over drain by Josph Macho, RN.

## 2013-09-12 NOTE — Interval H&P Note (Signed)
History and Physical Interval Note:  09/12/2013 1:03 PM  [name] [name]  has presented today for surgery, with the diagnosis of left breast cancer  The various methods of treatment have been discussed with the patient and family.   Husband, Roderic Palau, with patient today.  She wants to spend the night.  After consideration of risks, benefits and other options for treatment, the patient has consented to  Procedure(s): BREAST LUMPECTOMY WITH NEEDLE LOCALIZATION AND AXILLARY LYMPH NODE DISSECTION (Left) as a surgical intervention .  The patient's history has been reviewed, patient examined, no change in status, stable for surgery.  I have reviewed the patient's chart and labs.  Questions were answered to the patient's satisfaction.     [name] H

## 2013-09-13 ENCOUNTER — Encounter (HOSPITAL_BASED_OUTPATIENT_CLINIC_OR_DEPARTMENT_OTHER): Payer: Self-pay | Admitting: Surgery

## 2013-09-13 MED ORDER — HYDROCODONE-ACETAMINOPHEN 5-325 MG PO TABS: 1.0000 | ORAL_TABLET | Freq: Four times a day (QID) | ORAL | Status: DC | PRN

## 2013-09-13 NOTE — Discharge Summary (Signed)
Physician Discharge Summary  Patient ID:  [name] [name]  MRN: [id number]  DOB/AGE: [birth year] 53 y.o.  Admit date: 09/12/2013 Discharge date: 09/13/2013  Discharge Diagnoses:  1. 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 1a. Power port placed 04/21/2013 - D Montrelle Eddings  1b. Biopsy of superior area of left breast showed fat necrosis - 04/18/2013  2. Palpitations   Sees Dr. Elvera Lennox. Rothbart  3. Osteoarthritis of both hips  4. On Celebrex   To stop this one week before surgery  5. To stop Premarin  6. Anxiety  7. 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 Ezzard Standing  Discharged Condition: good  Hospital Course: [name] [name] is an 53 y.o. female whose primary care physician is Harlow Asa, MD and who was admitted 09/12/2013 with a chief complaint of Left breast cancer with biopsy proven cancer to left axillary lymph nodes.   She was brought to the operating room on 09/12/2013 and underwent  Left BREAST LUMPECTOMY WITH NEEDLE LOCALIZATION x 2 AND AXILLARY LYMPH NODE DISSECTION.  She spent the night at [facility].  She had some bleeding around the [place] drain.  But this is better this AM. She is doing well this AM.  Her pain is under good control.  And she is ready to go home.  The discharge instructions were reviewed with the patient.  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 Assess response to neoadjuvant chemotherapy.  EXAM: BILATERAL BREAST MRI WITH AND WITHOUT CONTRAST  TECHNIQUE: Multiplanar, multisequence MR images of both breasts were obtained prior to and following the intravenous administration of 14ml of MultiHance   IMPRESSION: Excellent response to neoadjuvant chemotherapy with no significant residual enhancing masses visible within the breast. Also, the left axillary lymph nodes have markedly decreased in size.  RECOMMENDATION: Treatment plan.  BI-RADS CATEGORY  6: Known biopsy-proven malignancy - appropriate action should be taken.   Electronically Signed   By: Anselmo Pickler M.D.   On: 08/28/2013 11:27   Mm Breast Surgical Specimen  09/12/2013   CLINICAL DATA Patient has undergone chemotherapy for left breast cancer and presents for bracketed localization 2 biopsy sites in the left upper outer quadrant prior to lumpectomy.  EXAM NEEDLE LOCALIZATION OF THE LEFT BREAST WITH MAMMO GUIDANCE  COMPARISON Previous exams.  FINDINGS Patient presents for needle localization prior to lumpectomy. I met with the patient and we discussed the procedure of needle localization including benefits and alternatives. We discussed the high likelihood of a successful procedure. We discussed the risks of the procedure, including infection, bleeding, tissue injury, and further surgery. Informed, written consent was given. The usual time-out protocol was performed immediately prior to the procedure.  Using mammographic guidance, sterile technique, 2% lidocaine, and two 7 cm modified Kopans needles, the  wing clip marking the site of prior invasive ductal carcinoma and the cylindrical clip marking the site of prior ductal carcinoma in situ were each localized using a lateromedial approach. The films were marked for Dr. Ezzard Standing.  Specimen radiograph was performed at [facility] and  confirms the 2 wires, the wing clip, the dumbbell clip in the cylindrical clip to be present in the tissue sample. The specimen was marked for pathology.  IMPRESSION Needle localization left breast. No apparent complications.  SIGNATURE  Electronically Signed   By: Cain Saupe M.D.   On: 09/12/2013 17:09   Mm Lt Plc Breast Loc Dev   1st Lesion  Inc Mammo Guide  Discharge Exam:  Filed Vitals:   09/13/13 0700  BP: 104/64  Pulse: 68  Temp: 98 F (36.7 C)  Resp:     General: WN AA F who is alert and generally healthy appearing.  Chest:  Left breast wound with dressing.  She had some drainage around the drain site.  This seems to have stopped.  Discharge Medications:     Medication List         ALPRAZolam 1 MG tablet  Commonly known as:  XANAX  TAKE 1 TABLET BY MOUTH 3 TIMES A DAY AS NEEDED FOR ANXIETY     atenolol 50 MG tablet  Commonly known as:  TENORMIN  TAKE 1 TABLET BY MOUTH EVERY DAY FOR PALPITATION     cholestyramine 4 G packet  Commonly known as:  QUESTRAN  Take 1 packet (4 g total) by mouth 2 (two) times daily as needed (diarrhea).     ciclopirox 8 % solution  Commonly known as:  PENLAC  Apply topically at bedtime. Apply over nail & skin daily. Apply over previous coat x 7 days, remove with alcohol and continue.     citalopram 40 MG tablet  Commonly known as:  CELEXA  Take 40 mg by mouth every evening.     dexamethasone 4 MG tablet  Commonly known as:  DECADRON  Take 2 tablets (8 mg total) by mouth 2 (two) times daily with a meal. Take two times a day the day before Taxotere. Then take two times a day starting the day after chemo for 3 days.     diphenhydramine-acetaminophen 25-500 MG Tabs  Commonly known as:  TYLENOL PM  Take 2 tablets by mouth at bedtime.     HYDROcodone-acetaminophen 5-325 MG per tablet  Commonly known as:  NORCO/VICODIN  Take 1-2 tablets by mouth every 6 (six) hours as needed.     lidocaine-prilocaine cream  Commonly known as:   EMLA  Apply topically as needed.     loperamide 2 MG capsule  Commonly known as:  IMODIUM  Take 2 mg by mouth as needed for diarrhea or loose stools.     LORazepam 0.5 MG tablet  Commonly known as:  ATIVAN  TAKE 1 TABLET EVERY 6 HOURS AS NEEDED FOR NAUSEA OR VOMITING     nystatin 100000 UNIT/ML suspension  Commonly known as:  MYCOSTATIN     ondansetron 8 MG tablet  Commonly known as:  ZOFRAN  Take 1 tablet (8 mg total) by mouth 2 (two) times daily. Take two times a day starting the day after chemo for 3 days. Then take two times a day as needed for nausea or vomiting.     potassium chloride 10 MEQ tablet  Commonly known as:  K-DUR  Take 2 tablets (20 mEq total) by mouth  3 (three) times daily. X 7 days.  Then 2 tabs by mouth twice daily or as directed     prochlorperazine 10 MG tablet  Commonly known as:  COMPAZINE  Take 1 tablet (10 mg total) by mouth every 6 (six) hours as needed (Nausea or vomiting).     valACYclovir 1000 MG tablet  Commonly known as:  VALTREX  Take 1,000 mg by mouth daily.        Disposition: 01-Home or Self Care      Discharge Orders   Future Appointments Provider Department Dept Phone   09/18/2013 8:15 AM [facility] 1 [facility] [phone]   09/18/2013 8:30 AM [facility]18 [facility] [phone]   09/25/2013 12:15 PM [facility] 4 [facility] [phone]   09/25/2013 1:15 PM Amy Wenda [facility] [phone]   09/25/2013 2:15 PM [facility] F18 [facility] [phone]   10/02/2013 1:45 PM [facility] 6 [facility] [phone]   10/02/2013 2:15 PM Amy Wenda [facility] [phone]   10/02/2013 3:15 PM [facility] B6 [facility] [phone]   10/09/2013 8:30 AM [facility]  Only [facility] [phone]   10/16/2013 8:30 AM [facility] Only [facility] [phone]   Future Orders Complete By Expires   Diet - low sodium heart healthy  As directed    Increase activity slowly  As directed       Activity:  Driving - May drive in 2 or 3 days, if doing well   Lifting - No lifting more than 15 pounds for one week.  But use arm otherwise as normal.  Wound Care:   Leave dressing on for two more days.  May remove bandage Friday, 09/15/2013, and shower.  Place gauze around where drain leaves skin.           Record the amount of drainage in the drain and bring those with you to the office.  Diet:  As tolerate.  Follow up appointment:  Call Dr. Allene Pyo office [facility]) at [phone] for an appointment in 1 week.  Medications and dosages:  Resume your home medications.  You have a prescription for:  Vicodin  Signed: Ovidio Kin, M.D., [facility] Office:  [phone]  09/13/2013, 8:27 AM

## 2013-09-13 NOTE — Discharge Instructions (Signed)
[facility] - DISCHARGE INSTRUCTIONS TO PATIENT  Activity:  Driving - May drive in 2 or 3 days, if doing well   Lifting - No lifting more than 15 pounds for one week.  But use arm otherwise as normal.  Wound Care:   Leave dressing on for two more days.  May remove bandage Friday, 09/15/2013, and shower.  Place gauze around where drain leaves skin.           Record the amount of drainage in the drain and bring those with you to the office.  Diet:  As tolerate.  Follow up appointment:  Call Dr. Pollie Friar office [facility]) at [phone] for an appointment in 1 week.  Medications and dosages:  Resume your home medications.  You have a prescription for:  Vicodin  Call Dr. Lucia Gaskins or his office  [phone]) if you have:  Temperature greater than 100.4,  Persistent nausea and vomiting,  Severe uncontrolled pain,  Redness, tenderness, or signs of infection (pain, swelling, redness, odor or green/yellow discharge around the site),  Difficulty breathing, headache or visual disturbances,  Any other questions or concerns you may have after discharge.  In an emergency, call 911 or go to an Emergency Department at a nearby hospital.  About my Jackson-Pratt Bulb Drain  What is a Jackson-Pratt bulb? A Jackson-Pratt is a soft, round device used to collect drainage. It is connected to a long, thin drainage catheter, which is held in place by one or two small stiches near your surgical incision site. When the bulb is squeezed, it forms a vacuum, forcing the drainage to empty into the bulb.  Emptying the Jackson-Pratt bulb- To empty the bulb: 1. Release the plug on the top of the bulb. 2. Pour the bulb's contents into a measuring container which your nurse will provide. 3. Record the time emptied and amount of drainage. Empty the drain(s) as often as your     doctor or nurse recommends.  Date                  Time                    Amount (Drain 1)                  Amount (Drain 2)  _____________________________________________________________________  _____________________________________________________________________  _____________________________________________________________________  _____________________________________________________________________  _____________________________________________________________________  _____________________________________________________________________  _____________________________________________________________________  _____________________________________________________________________  Squeezing the Jackson-Pratt Bulb- To squeeze the bulb: 1. Make sure the plug at the top of the bulb is open. 2. Squeeze the bulb tightly in your fist. You will hear air squeezing from the bulb. 3. Replace the plug while the bulb is squeezed. 4. Use a safety pin to attach the bulb to your clothing. This will keep the catheter from     pulling at the bulb insertion site.  When to call your doctor- Call your doctor if:  Drain site becomes red, swollen or hot.  You have a fever greater than 101 degrees F.  There is oozing at the drain site.  Drain falls out (apply a guaze bandage over the drain hole and secure it with tape).  Drainage increases daily not related to activity patterns. (You will usually have more drainage when you are active than when you are resting.)  Drainage has a bad odor.

## 2013-09-14 ENCOUNTER — Other Ambulatory Visit: Payer: Self-pay | Admitting: Family Medicine

## 2013-09-15 ENCOUNTER — Telehealth: Payer: Self-pay | Admitting: *Deleted

## 2013-09-15 ENCOUNTER — Telehealth (INDEPENDENT_AMBULATORY_CARE_PROVIDER_SITE_OTHER): Payer: Self-pay

## 2013-09-15 NOTE — Telephone Encounter (Signed)
Opened in error.  No new note.

## 2013-09-15 NOTE — Telephone Encounter (Signed)
Last seen 03/22/13

## 2013-09-15 NOTE — Telephone Encounter (Signed)
Pt called and was given her benign pathology results.  Also told margins were clear.  Will make Dr. Lucia Gaskins aware.

## 2013-09-18 ENCOUNTER — Other Ambulatory Visit (HOSPITAL_BASED_OUTPATIENT_CLINIC_OR_DEPARTMENT_OTHER): Payer: BC Managed Care – PPO

## 2013-09-18 ENCOUNTER — Encounter (INDEPENDENT_AMBULATORY_CARE_PROVIDER_SITE_OTHER): Payer: BC Managed Care – PPO

## 2013-09-18 ENCOUNTER — Ambulatory Visit (HOSPITAL_BASED_OUTPATIENT_CLINIC_OR_DEPARTMENT_OTHER): Payer: BC Managed Care – PPO

## 2013-09-18 ENCOUNTER — Other Ambulatory Visit: Payer: Self-pay | Admitting: Physician Assistant

## 2013-09-18 DIAGNOSIS — C50412 Malignant neoplasm of upper-outer quadrant of left female breast: Secondary | ICD-10-CM

## 2013-09-18 DIAGNOSIS — E860 Dehydration: Secondary | ICD-10-CM

## 2013-09-18 DIAGNOSIS — C50419 Malignant neoplasm of upper-outer quadrant of unspecified female breast: Secondary | ICD-10-CM

## 2013-09-18 LAB — CBC WITH DIFFERENTIAL/PLATELET
BASO%: 0.4 % (ref 0.0–2.0)
Basophils Absolute: 0 10*3/uL (ref 0.0–0.1)
EOS%: 0.6 % (ref 0.0–7.0)
Eosinophils Absolute: 0 10*3/uL (ref 0.0–0.5)
HCT: 32.1 % — ABNORMAL LOW (ref 34.8–46.6)
HGB: 10.9 g/dL — ABNORMAL LOW (ref 11.6–15.9)
LYMPH%: 24.1 % (ref 14.0–49.7)
MCH: 32.2 pg (ref 25.1–34.0)
MCHC: 34 g/dL (ref 31.5–36.0)
MCV: 94.6 fL (ref 79.5–101.0)
MONO#: 0.7 10*3/uL (ref 0.1–0.9)
MONO%: 10.9 % (ref 0.0–14.0)
NEUT#: 3.9 10*3/uL (ref 1.5–6.5)
NEUT%: 64 % (ref 38.4–76.8)
Platelets: 195 10*3/uL (ref 145–400)
RBC: 3.39 10*6/uL — ABNORMAL LOW (ref 3.70–5.45)
RDW: 17 % — ABNORMAL HIGH (ref 11.2–14.5)
WBC: 6.1 10*3/uL (ref 3.9–10.3)
lymph#: 1.5 10*3/uL (ref 0.9–3.3)

## 2013-09-18 LAB — COMPREHENSIVE METABOLIC PANEL (CC13)
ALT: 29 U/L (ref 0–55)
AST: 23 U/L (ref 5–34)
Albumin: 3.3 g/dL — ABNORMAL LOW (ref 3.5–5.0)
Alkaline Phosphatase: 49 U/L (ref 40–150)
Anion Gap: 9 mEq/L (ref 3–11)
BUN: 15.4 mg/dL (ref 7.0–26.0)
CO2: 29 mEq/L (ref 22–29)
Calcium: 9.2 mg/dL (ref 8.4–10.4)
Chloride: 106 mEq/L (ref 98–109)
Creatinine: 0.8 mg/dL (ref 0.6–1.1)
Glucose: 95 mg/dl (ref 70–140)
Potassium: 4.1 mEq/L (ref 3.5–5.1)
Sodium: 144 mEq/L (ref 136–145)
Total Bilirubin: 0.3 mg/dL (ref 0.20–1.20)
Total Protein: 6.2 g/dL — ABNORMAL LOW (ref 6.4–8.3)

## 2013-09-18 LAB — MAGNESIUM (CC13): Magnesium: 1.3 mg/dl — CL (ref 1.5–2.5)

## 2013-09-18 MED ORDER — SODIUM CHLORIDE 0.9 % IV SOLN
INTRAVENOUS | Status: DC
  Administered 2013-09-18: 10:00:00 via INTRAVENOUS
  Filled 2013-09-18: qty 1000

## 2013-09-18 MED ORDER — SODIUM CHLORIDE 0.9 % IJ SOLN
10.0000 mL | Freq: Once | INTRAMUSCULAR | Status: AC
  Administered 2013-09-18: 10 mL via INTRAVENOUS
  Filled 2013-09-18: qty 10

## 2013-09-18 MED ORDER — HEPARIN SOD (PORK) LOCK FLUSH 100 UNIT/ML IV SOLN
500.0000 [IU] | Freq: Once | INTRAVENOUS | Status: AC
  Administered 2013-09-18: 500 [IU] via INTRAVENOUS
  Filled 2013-09-18: qty 5

## 2013-09-18 NOTE — Patient Instructions (Signed)
Hypomagnesemia Magnesium is a common ion (mineral) in the body which is needed for metabolism. It is about how the body handles food and other chemical reactions necessary for life. Only about 2% of the magnesium in our body is found in the blood. When this is low, it is called hypomagnesemia. The blood will measure only a tiny amount of the magnesium in our body. When it is low in our blood, it does not mean that the whole body supply is low. The normal serum concentration ranges from 1.8-2.5 mEq/L. When the level gets to be less than 1.0 mEq/L, a number of problems begin to happen.  CAUSES   Receiving intravenous fluids without magnesium replacement.  Loss of magnesium from the bowel by naso-gastric suction.  Loss of magnesium from nausea and vomiting or severe diarrhea. Any of the inflammatory bowel conditions can cause this.  Abuse of alcohol often leads to low serum magnesium.  An inherited form of magnesium loss happens when the kidneys lose magnesium. This is called familial or primary hypomagnesemia.  Some medications such as diuretics also cause the loss of magnesium. SYMPTOMS  These following problems are worse if the changes in magnesium levels come on suddenly.  Tremor.  Confusion.  Muscle weakness.  Over-sensitive to sights and sounds.  Sensitive reflexes.  Depression.  Muscular fibrillations.  Over-reactivity of the nerves.  Irritability.  Psychosis.  Spasms of the hand muscles.  Tetany (where the muscles go into uncontrollable spasms). DIAGNOSIS  This condition can be diagnosed by blood tests. TREATMENT   In emergency, magnesium can be given intravenously (by vein).  If the condition is less worrisome, it can be corrected by diet. High levels of magnesium are found in green leafy vegetables, peas, beans and nuts among other things. It can also be given through medications by mouth.  If it is being caused by medications, changes can be made.  If  alcohol is a problem, help is available if there are difficulties giving it up. Document Released: 03/18/2005 Document Revised: 09/14/2011 Document Reviewed: 02/10/2008 [employer] Patient Information 2014 [employer].        

## 2013-09-18 NOTE — Progress Notes (Signed)
0935-Mg-1.3, called results to [facility] RN to report to [employer] [facility]. Awaiting callback for fluids. Patient is agreeable to access PAC in case fluids are ordered.   45- Order received from IVFs w/ Mg to run over 3hrs, explained to patient, voices understanding.

## 2013-09-25 ENCOUNTER — Ambulatory Visit: Payer: BC Managed Care – PPO

## 2013-09-25 ENCOUNTER — Telehealth: Payer: Self-pay | Admitting: *Deleted

## 2013-09-25 ENCOUNTER — Encounter: Payer: Self-pay | Admitting: Physician Assistant

## 2013-09-25 ENCOUNTER — Other Ambulatory Visit (HOSPITAL_BASED_OUTPATIENT_CLINIC_OR_DEPARTMENT_OTHER): Payer: BC Managed Care – PPO

## 2013-09-25 ENCOUNTER — Ambulatory Visit (HOSPITAL_BASED_OUTPATIENT_CLINIC_OR_DEPARTMENT_OTHER): Payer: BC Managed Care – PPO | Admitting: Physician Assistant

## 2013-09-25 ENCOUNTER — Telehealth: Payer: Self-pay | Admitting: Physician Assistant

## 2013-09-25 VITALS — BP 119/85 | HR 75 | Temp 98.4°F | Resp 18 | Ht 61.0 in | Wt 157.8 lb

## 2013-09-25 DIAGNOSIS — M2550 Pain in unspecified joint: Secondary | ICD-10-CM

## 2013-09-25 DIAGNOSIS — F430 Acute stress reaction: Secondary | ICD-10-CM

## 2013-09-25 DIAGNOSIS — C50419 Malignant neoplasm of upper-outer quadrant of unspecified female breast: Secondary | ICD-10-CM

## 2013-09-25 DIAGNOSIS — F411 Generalized anxiety disorder: Secondary | ICD-10-CM

## 2013-09-25 DIAGNOSIS — E876 Hypokalemia: Secondary | ICD-10-CM

## 2013-09-25 DIAGNOSIS — D6481 Anemia due to antineoplastic chemotherapy: Secondary | ICD-10-CM

## 2013-09-25 DIAGNOSIS — K219 Gastro-esophageal reflux disease without esophagitis: Secondary | ICD-10-CM

## 2013-09-25 DIAGNOSIS — C50412 Malignant neoplasm of upper-outer quadrant of left female breast: Secondary | ICD-10-CM

## 2013-09-25 DIAGNOSIS — F329 Major depressive disorder, single episode, unspecified: Secondary | ICD-10-CM

## 2013-09-25 DIAGNOSIS — B351 Tinea unguium: Secondary | ICD-10-CM

## 2013-09-25 DIAGNOSIS — R011 Cardiac murmur, unspecified: Secondary | ICD-10-CM

## 2013-09-25 DIAGNOSIS — T451X5A Adverse effect of antineoplastic and immunosuppressive drugs, initial encounter: Secondary | ICD-10-CM

## 2013-09-25 DIAGNOSIS — F32A Depression, unspecified: Secondary | ICD-10-CM

## 2013-09-25 LAB — CBC WITH DIFFERENTIAL/PLATELET
BASO%: 0.5 % (ref 0.0–2.0)
Basophils Absolute: 0 10*3/uL (ref 0.0–0.1)
EOS%: 0.5 % (ref 0.0–7.0)
Eosinophils Absolute: 0 10*3/uL (ref 0.0–0.5)
HCT: 35 % (ref 34.8–46.6)
HGB: 11.7 g/dL (ref 11.6–15.9)
LYMPH%: 15.7 % (ref 14.0–49.7)
MCH: 31.4 pg (ref 25.1–34.0)
MCHC: 33.4 g/dL (ref 31.5–36.0)
MCV: 94.1 fL (ref 79.5–101.0)
MONO#: 0.8 10*3/uL (ref 0.1–0.9)
MONO%: 9.2 % (ref 0.0–14.0)
NEUT#: 6.2 10*3/uL (ref 1.5–6.5)
NEUT%: 74.1 % (ref 38.4–76.8)
Platelets: 227 10*3/uL (ref 145–400)
RBC: 3.72 10*6/uL (ref 3.70–5.45)
RDW: 15.7 % — ABNORMAL HIGH (ref 11.2–14.5)
WBC: 8.4 10*3/uL (ref 3.9–10.3)
lymph#: 1.3 10*3/uL (ref 0.9–3.3)

## 2013-09-25 LAB — COMPREHENSIVE METABOLIC PANEL (CC13)
ALT: 48 U/L (ref 0–55)
AST: 24 U/L (ref 5–34)
Albumin: 3.4 g/dL — ABNORMAL LOW (ref 3.5–5.0)
Alkaline Phosphatase: 67 U/L (ref 40–150)
Anion Gap: 10 mEq/L (ref 3–11)
BUN: 11.7 mg/dL (ref 7.0–26.0)
CO2: 27 mEq/L (ref 22–29)
Calcium: 9.5 mg/dL (ref 8.4–10.4)
Chloride: 106 mEq/L (ref 98–109)
Creatinine: 0.8 mg/dL (ref 0.6–1.1)
Glucose: 97 mg/dl (ref 70–140)
Potassium: 3.4 mEq/L — ABNORMAL LOW (ref 3.5–5.1)
Sodium: 143 mEq/L (ref 136–145)
Total Bilirubin: 0.46 mg/dL (ref 0.20–1.20)
Total Protein: 6.8 g/dL (ref 6.4–8.3)

## 2013-09-25 LAB — MAGNESIUM (CC13): Magnesium: 1.5 mg/dl (ref 1.5–2.5)

## 2013-09-25 NOTE — Telephone Encounter (Signed)
, °

## 2013-09-25 NOTE — Progress Notes (Signed)
ID: [name] [name] OB: 12-19-1960  MR#: [id number]  NTZ#:[id number]  PCP: Rubbie Battiest, MD ; Pearson Forster, NP GYN:   Josefa Half, MD  SU: Alphonsa Overall, MD Manning:  Thea Silversmith, MD OTHER MD: Freida Busman, MD;  Neysa Bonito, MD  CHIEF COMPLAINT:  Left Breast Cancer/Neoadjuvant Chemo   BREAST CANCER HISTORY: [name] [name] palpated a mass in her left breast in late August 2014. She was already scheduled for screening mammography at [facility] and this was performed 03/13/2013. Indeed a possible mass was noted in the left breast and on 03/29/2013 the patient underwent diagnostic left mammography and left ultrasonography. This showed a 2.5 cm irregular mass in the upper outer quadrant of the left breast. This was palpable.by ultrasound there was a 1.6 cm irregular hypoechoic mass in the area in question, and in addition to enlarged left axillary lymph nodes were noted, the largest measuring 2.5 cm.  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  Bilateral breast MRI 04/11/2013 shows 3 abnormal enhancing masses in the upper outer quadrant of the left breast measuring altogether 6.4 cm. The largest separate mass measured 2.7 cm. There were abnormal or large lymph nodes in the left axilla. Ultrasound guided biopsy of the 2 smaller masses has been scheduled.  The patient's subsequent history is as detailed below   INTERVAL HISTORY: [name] returns alone today for followup of her left breast cancer.  She completed her neoadjuvant chemotherapy here in late February, and is now status post left lumpectomy under the care of Dr. Lucia Gaskins on 09/12/2013. She had no  complications from surgery. She still has some mild postsurgical pain, but is recovering well with no complications. The drain is still in place, but she has noticed less drainage and is hoping to have that removed later this week when she sees him on March 25.  [name] [name] herself is feeling much better than when last seen here. She's eating and drinking well. Her magnesium level has normalized, and her potassium level is stable at 3.4. She's having no fevers or chills. She's had no nausea or emesis. She is perhaps most excited to tell me that she is having no diarrhea whatsoever! She is using neither Questran nor Imodium. She's had no fevers, but does feel chilled on occasion.    REVIEW OF SYSTEMS: [name] [name]'s energy level is still quite low, but of course she is still recovering from her surgery 2 weeks ago. She's had no rashes or skin changes, but continues to have problems with nailbed changes. Affecting both the upper and lower extremities. The nails are dark, thickened, and some of them are loosening from the nail beds. There is no obvious drainage or evidence of frank infection. She is using Penlac and is also soaking the nails daily.. She does have some mild tearing and also continues to have a runny nose. She's had no sinus congestion and denies any cough, phlegm production, pleurisy, orthopnea, peripheral swelling, chest pain, or palpitations. She has occasional headaches which she attributes to increased stress. These come and go under not particularly unusual for her. She denies any dizziness or gait disturbance. She does have some slight blurred  vision at times. She continues to feel anxious. She also continues to have some joint pain she attributes to arthritis. She's had no new or increased myalgias, arthralgias, or bony pain. She has only some very slight residual numbness in the tips of her fingers, nothing that is worsening, and nothing that is affecting her day-to-day activities.  A detailed  review of systems is otherwise stable and noncontributory.  PAST MEDICAL HISTORY: Past Medical History  Diagnosis Date  . Chest pain 2009    Consultation-[facility], negative chest CT; nl echo in 2005; h/o palpitations  . Palpitations   . Degenerative joint disease     + degenerative joint disease of the lumbosacral spine  . Colitis 2010    not IBD  . Herpes simplex type II infection   . Allergic rhinitis   . Hypercholesteremia     "slightly high"  . Anxiety   . Heart murmur     "small"  . GERD (gastroesophageal reflux disease)     "a little"  . Breast cancer 03/31/13    left  . Cancer   . Depression   . Hypertension   . Anemia, unspecified 05/30/2013  . Wears glasses     PAST SURGICAL HISTORY: Past Surgical History  Procedure Laterality Date  . Colonoscopy  10/2010    proctitis; melanosis coli  . Tubal ligation    . Breast excisional biopsy  04/2003    Left; benign disease  . Right oophorectomy  03/13/2007  . Abdominal hysterectomy  03/13/2007    TAH ?BSO--Dr Levin Bacon, [place]  . Eye surgery Left     "fix lazy eye"  . Portacath placement Right 04/21/2013    Procedure: INSERTION [name]-A-CATH;  Surgeon: [name], MD;  Location: [facility];  Service: General;  Laterality: Right;  . Breast lumpectomy with needle localization and axillary lymph node dissection Left 09/12/2013    Procedure: BREAST LUMPECTOMY WITH NEEDLE LOCALIZATION AND AXILLARY LYMPH NODE DISSECTION;  Surgeon: [name], MD;  Location: [name];  Service: General;  Laterality: Left;  and axilla    FAMILY HISTORY Family History  Problem Relation Age of Onset  . Aneurysm Mother     Cerebral  . Hypertension Mother   . Hyperlipidemia Mother   . Stroke Mother   . Coronary artery disease Father   . Hypertension Father   . Hyperlipidemia Father   . Lung cancer Brother   . Cancer Brother     Lung  The patient's father died at the age of 33 from a myocardial infarction. The patient's  mother died at the age of 69 from a stroke. The patient had 6 brothers, 2 sisters. One brother has a history of lung cancer in the setting of tobacco abuse. Another brother had multiple myeloma. There is no history of breast or ovarian cancer in the family  GYNECOLOGIC HISTORY: (Updated 09/25/2013) Menarche age 21, first live birth age 59, the patient is [name] P4. She status post abdominal hysterectomy with bilateral salpingo-oophorectomy. She never took hormone replacement.   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 Her husband [name] works in Char[employer] fundraiser. Daughter [name] lives at home with the patient with her 3 children, all under the age of 66. She is not employed. Son Bethann Qualley lives in [place] and is not employed. Daughter Maggie Schwalbe Dillard lives in [place] and  is a Barrister's clerk. Daughter Ernst Spell is a Education officer, museum in [place]. The patient has 10 grandchildren. She attends [employer]     ADVANCED DIRECTIVES: Not in place   HEALTH MAINTENANCE:  (Updated 09/25/2013)  History  Substance Use Topics  . Smoking status: Former Smoker -- 0.50 packs/day for 15 years    Types: Cigarettes    Quit date: 07/06/1982  . Smokeless tobacco: Never Used  . Alcohol Use: No     Colonoscopy:2012  PAP:2014   Bone density: Never  Lipid panel: Not on file    Allergies  Allergen Reactions  . Doxycycline Other (See Comments)    Unknown    Current Outpatient Prescriptions  Medication Sig Dispense Refill  . ALPRAZolam (XANAX) 1 MG tablet TAKE 1 TABLET BY MOUTH 3 TIMES A DAY AS NEEDED FOR ANXIETY  90 tablet  0  . atenolol (TENORMIN) 50 MG tablet TAKE 1 TABLET BY MOUTH EVERY DAY FOR PALPITATION  90 tablet  1  . ciclopirox (PENLAC) 8 % solution Apply topically at bedtime. Apply over nail & skin daily. Apply over previous coat x 7  days, remove with alcohol and continue.  6.6 mL  1  . citalopram (CELEXA) 40 MG tablet Take 40 mg by mouth every evening.      . diphenhydramine-acetaminophen (TYLENOL PM) 25-500 MG TABS Take 2 tablets by mouth at bedtime.      . lidocaine-prilocaine (EMLA) cream Apply topically as needed.  30 g  0  . potassium chloride (K-DUR) 10 MEQ tablet Take 2 tablets (20 mEq total) by mouth 3 (three) times daily. X 7 days.  Then 2 tabs by mouth twice daily or as directed  180 tablet  1  . valACYclovir (VALTREX) 1000 MG tablet TAKE 1 TABLET BY MOUTH DAILY  30 tablet  5  . cholestyramine (QUESTRAN) 4 G packet Take 1 packet (4 g total) by mouth 2 (two) times daily as needed (diarrhea).  30 each  1  . dexamethasone (DECADRON) 4 MG tablet Take 2 tablets (8 mg total) by mouth 2 (two) times daily with a meal. Take two times a day the day before Taxotere. Then take two times a day starting the day after chemo for 3 days.  30 tablet  1  . HYDROcodone-acetaminophen (NORCO/VICODIN) 5-325 MG per tablet Take 1-2 tablets by mouth every 6 (six) hours as needed.  30 tablet  0  . loperamide (IMODIUM) 2 MG capsule Take 2 mg by mouth as needed for diarrhea or loose stools.      Marland Kitchen LORazepam (ATIVAN) 0.5 MG tablet TAKE 1 TABLET EVERY 6 HOURS AS NEEDED FOR NAUSEA OR VOMITING  30 tablet  0  . nystatin (MYCOSTATIN) 100000 UNIT/ML suspension       . ondansetron (ZOFRAN) 8 MG tablet Take 1 tablet (8 mg total) by mouth 2 (two) times daily. Take two times a day starting the day after chemo for 3 days. Then take two times a day as needed for nausea or vomiting.  30 tablet  1  . prochlorperazine (COMPAZINE) 10 MG tablet Take 1 tablet (10 mg total) by mouth every 6 (six) hours as needed (Nausea or vomiting).  30 tablet  1  . [DISCONTINUED] potassium chloride SA (K-DUR,KLOR-CON) 20 MEQ tablet Take 1 tablet (20 mEq total) by mouth 2 (two) times daily.  20 tablet  1   No current facility-administered medications for this visit.    OBJECTIVE:  Middle-aged [name] [name]  woman who appears comfortable and is in no acute distress Filed Vitals:   09/25/13 1249  BP: 119/85  Pulse: 75  Temp: 98.4 F (36.9 C)  Resp: 18  Body mass index is 29.83 kg/(m^2).  ECOG: 1 Filed Weights   09/25/13 1249  Weight: 157 lb 12.8 oz (71.578 kg)   Physical Exam: HEENT:  Sclerae anicteric.  Oropharynx clear, moist and pink with no evidence of ulcerations or candidiasis.  Neck is supple, trachea midline.  NODES:  No cervical or supraclavicular lymphadenopathy palpated.  BREAST EXAM:  Patient status post left lumpectomy with well healing incision. No drainage, swelling, or erythema, and no evidence of infection. Drain is still in place with a small amount (less than 25 cc) of serosanguineous drainage. Axillae are benign bilaterally, with no palpable lymphadenopathy.  LUNGS:  Clear to auscultation bilaterally with fair excursion.   No wheezes, crackles, or rhonchi HEART:  Regular rate and rhythm. Soft systolic murmur. ABDOMEN:  Soft, nontender. No hepatomegaly. Positive bowel sounds.  MSK:  No focal spinal tenderness to palpation. Good range of motion bilaterally in the upper extremities. EXTREMITIES:  No peripheral edema.  No lymphedema noted in the upper extremities. SKIN: Significant nail dyscrasia consistent with onychomycosis noted bilaterally. The nails are dark, and thickened and are loosening from the nailbed. Skin is mildly hyperpigmented, but otherwise clear with no additional rashes noted. No excessive ecchymoses. No petechiae.  NEURO:  Nonfocal. Well oriented.  Fatigued affect.      LAB RESULTS:   Lab Results  Component Value Date   WBC 8.4 09/25/2013   NEUTROABS 6.2 09/25/2013   HGB 11.7 09/25/2013   HCT 35.0 09/25/2013   MCV 94.1 09/25/2013   PLT 227 09/25/2013      Chemistry      Component Value Date/Time   NA 143 09/25/2013 1211   NA 139 06/06/2008 0545   K 3.4* 09/25/2013 1211   K 3.8 06/06/2008 0545   CL 104 06/06/2008 0545    CO2 27 09/25/2013 1211   CO2 26 06/06/2008 0545   BUN 11.7 09/25/2013 1211   BUN 6 06/06/2008 0545   CREATININE 0.8 09/25/2013 1211   CREATININE 0.77 06/06/2008 0545      Component Value Date/Time   CALCIUM 9.5 09/25/2013 1211   CALCIUM 8.7 06/06/2008 0545   ALKPHOS 67 09/25/2013 1211   ALKPHOS 62 04/12/2008 1725   AST 24 09/25/2013 1211   AST 21 04/12/2008 1725   ALT 48 09/25/2013 1211   ALT 28 04/12/2008 1725   BILITOT 0.46 09/25/2013 1211   BILITOT 0.5 04/12/2008 1725     Magnesium  1.5  09/25/2013       STUDIES:   Echocardiogram on 08/15/2013 showed an ejection fraction of 55-60%.  This will be due to be repeated again in mid May 2015.   Mr Breast Bilateral W Wo Contrast 08/28/2013   CLINICAL DATA:  Known left breast grade 2-3 invasive ductal carcinoma. Assess response to neoadjuvant chemotherapy.  EXAM: BILATERAL BREAST MRI WITH AND WITHOUT CONTRAST  TECHNIQUE: Multiplanar, multisequence MR images of both breasts were obtained prior to and following the intravenous administration of 51m of MultiHance  THREE-DIMENSIONAL MR IMAGE RENDERING ON INDEPENDENT WORKSTATION:  Three-dimensional MR images were rendered by post-processing of the original MR data on an independent workstation. The three-dimensional MR images were interpreted, and findings are reported in the following complete MRI report for this study. Three dimensional images were evaluated at the independent DynaCad workstation  COMPARISON:  Previous MR's of the breast dated 06/12/2013 and 04/11/2013.  FINDINGS: Breast composition:  B:  scattered fibroglandular  Background parenchymal enhancement: Mild to moderate  Right breast: No mass or abnormal enhancement.  Left breast: The previously seen areas of enhancement located within the upper outer quadrant of the left breast are no longer visible.  Lymph nodes: The multiple previously seen enlarged left axillary lymph nodes have considerably decreased in size with the largest remaining lymph  node visible measuring 7 mm in size.  Ancillary findings:  None.  IMPRESSION: Excellent response to neoadjuvant chemotherapy with no significant residual enhancing masses visible within the breast. Also, the left axillary lymph nodes have markedly decreased in size.  RECOMMENDATION: Treatment plan.  BI-RADS CATEGORY  6: Known biopsy-proven malignancy - appropriate action should be taken.   Electronically Signed   By: Luberta Robertson M.D.   On: 08/28/2013 11:27   Surgical Pathology - Left Lumpectomy, 09/12/2013  Diagnosis 1. Breast, lumpectomy, Left - BENIGN BREAST PARENCHYMA WITH TREATMENT RELATED CHANGES. - THERE IS NO EVIDENCE OF MALIGNANCY. - SEE ONCOLOGY TABLE BELOW. 2. Lymph node, sentinel, biopsy, Highest level 2 left Axillary node - THERE IS NO EVIDENCE OF CARCINOMA IN 1 OF 1 LYMPH NODE (0/1). 3. Lymph nodes, regional resection, Left Axilla contents - THERE IS NO EVIDENCE OF CARCINOMA IN 15 OF 15 LYMPH NODES (0/15). - TREATMENT RELATED CHANGES IN MULTIPLE LYMPH NODES. Microscopic Comment 1. BREAST, INVASIVE TUMOR, WITH LYMPH NODE SAMPLING Specimen, including laterality and lymph node sampling (sentinel, non-sentinel): Left breast and left axillary lymph nodes Procedure: Needle localized lumpectomy and axillary dissection Histologic type: N/A Margins: Negative for carcinoma Treatment effect: Significant, present in breast parenchyma and lymph nodes Lymph nodes: Examined: 1 Sentinel 15 Non-sentinel 16 Total Lymph nodes with metastasis: 0 TNM: ypT0, ypN0 (JBK:caf 09/14/13) Enid Cutter MD Pathologist, Electronic Signature (Case signed 09/14/2013)  ASSESSMENT/PLAN: 53 y.o. [name] [name], [name] [name] woman _0  (1)  status post left breast biopsy 03/29/2013 for a clinical T2-T3 pN1, stage IIB-IIIA invasive ductal carcinoma, grade 2-3, estrogen receptor 39% positive (with moderate staining intensity), progesterone receptor negative, with an MIB-1 of 83%, and HER-2 amplified by CISH, with a ratio of 7.5  and 15 HER-2 copies per cell  (2) neoadjuvant chemotherapy, with trastuzumab/ pertuzumab/ carboplatin/docetaxel started 05/01/2013, to be repeated every 3 weeks x6. Pertuzumab  held after 4 cycles of secondary to diarrhea. The patient received Neulasta on day 2 [facility] per her request. Trastuzumab to be continued for total of one year.   (3) Diarrhea most likely secondary to chemotherapy (Pertuzumab), resulting in dehydration -- received supportive IV fluids, Resolved   (4) skin rash most likely secondary to pertuzumab: Resolved.   (5) chemotherapy-induced anemia: Iron Indices are normal. now more symptomatic, and being scheduled for a blood transfusion later this week on 09/07/2013.   (6)  Hypokalemia -- Improving, on oral supplementation  (7)  Hypomagnesemia -- Resolved  (8)  status post left lumpectomy under the care of Dr. Lucia Gaskins, 09/12/2013, with no residual evidence of malignancy and no evidence of lymph node involvement. A total of 16 lymph nodes were examined (1 Sentinel and 15 non-sentinel), 0 with involvement. YpT0, ypN0.      PLAN: Overall,  [name] [name] is doing quite well and is so glad to have both the chemotherapy and the surgery behind her. Of course she will continue to receive trastuzumab on a Q. three-week basis. I will plan on seeing her again next week for brief followup with  labs and physical exam on March 30 prior to her first dose of single agent trastuzumab.   For the next few weeks, we will continue to check labs weekly including her metabolic panel/potassium and her magnesium levels. In the meanwhile, she'll continue on oral potassium at 20 mEq daily.   Regards to her onychomycosis, she'll continue with daily soaks and will use the Penlac solution as directed. Also encouraged her to make an appointment with a podiatrist if necessary, possibly to have the great toenails removed for comfort sake.  [name] [name] voices her understanding and agreement with the  above plan, and will call for any changes or problems prior to her next appointment. Of note, her next echocardiogram will be due in early May, and I will get that scheduled for her when she returns to see me next week.   Durene Fruits  Medical Oncology   09/25/2013 2:58 PM

## 2013-09-25 NOTE — Telephone Encounter (Signed)
Per staff message and POF I have scheduled appts.  JMW  

## 2013-09-27 ENCOUNTER — Encounter (INDEPENDENT_AMBULATORY_CARE_PROVIDER_SITE_OTHER): Payer: Self-pay | Admitting: Surgery

## 2013-09-27 ENCOUNTER — Ambulatory Visit (INDEPENDENT_AMBULATORY_CARE_PROVIDER_SITE_OTHER): Payer: BC Managed Care – PPO | Admitting: Surgery

## 2013-09-27 VITALS — BP 116/70 | HR 72 | Temp 98.4°F | Resp 14 | Ht 61.0 in | Wt 157.4 lb

## 2013-09-27 DIAGNOSIS — C50419 Malignant neoplasm of upper-outer quadrant of unspecified female breast: Secondary | ICD-10-CM

## 2013-09-27 DIAGNOSIS — C50412 Malignant neoplasm of upper-outer quadrant of left female breast: Secondary | ICD-10-CM

## 2013-09-27 NOTE — Progress Notes (Signed)
Re:   [name] [name] DOB:   10/24/1960 MRN:   [id number]  [facility]  ASSESSMENT AND PLAN: 1.  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  Left breast lumpectomy and left axillary node dissection - 09/12/2013 - Final path - no residual cancer in breast, 0/16 nodes  Drain removed today.  To continue Herceptin until October 2015.  I will see her back in 6 months.   1a.  Power port placed 04/21/2013 - D Dennys Traughber 1b.  Biopsy of superior area of left breast showed fat necrosis - 04/18/2013 2.  Palpitations  Sees Dr. Elvera Lennox. [facility]bart 3.  Osteoarthritis of both hips 4.  On Celebrex 5.  To stop Premarin  6.  Anxiety 7.  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  Will address this after the end of her treatment for her breast cancer.  She has seen Dr. Michaell Cowing for this.  A CT scan 03/27/2013 showed no direct evidence of a rectovaginal fistula.  Dr. Michaell Cowing has suggested another test to try to "document" the fistula.  That is on hold during for now as she goes through the treatment for breast cancer.    She still has stool from her vagina.  REFERRING PHYSICIAN: Harlow Asa, MD  HISTORY OF PRESENT ILLNESS: [name] [name] is a 53 y.o. (DOB: [birth year])  AA  female whose primary care physician is LUKING,W S, MD and comes for follow up of left breast cancer.  She comes by herself. She is doing well.  We reviewed the path report and I gave her a copy. We went over her JP drainage.  History of breast cancer: She gets a mammogram every year.  She felt a lump in the upper outer part of her left breast.  She has no family history of breast cancer.  She takes a Premarin on a PRN basis.  It sounds  like she takes no more that a few tabs a month.  She will stop this.  She had a hysterectomy for fibroids in 2010.  She had a mamm[facility] at [facility] on 03/13/2013 which found a left breast mass.  On 03/29/2013 she had a left breast biopsy.  The biopsy (Accession: [id number]) showed IDC and metastatic carcinoma to a left axillary lymph node. MRI 04/11/2013 shows - 3 abnormal enhancing masses in the upper-outer quadrant of left breast encompassing a total area of the 6.4 x 2.4 x 4 cm as described. The largest, superior and posterior mass was previously biopsied proven cancer. The 2 other smaller masses seen are suspicious for neoplasm.  She is for biopsy of these two other areas this Friday, 10/9.   Past Medical History  Diagnosis Date  . Chest pain 2009    Consultation-Rothbart, negative chest CT; nl echo in 2005; h/o palpitations  . Palpitations   . Degenerative joint disease     + degenerative joint disease of the lumbosacral spine  . Colitis 2010    not IBD  . Herpes simplex type II infection   . Allergic rhinitis   . Hypercholesteremia     "slightly high"  . Anxiety   . Heart murmur     "small"  . GERD (gastroesophageal reflux disease)     "a little"  .  Breast cancer 03/31/13    left  . Cancer   . Depression   . Hypertension   . Anemia, unspecified 05/30/2013  . Wears glasses       Current Outpatient Prescriptions  Medication Sig Dispense Refill  . ALPRAZolam (XANAX) 1 MG tablet TAKE 1 TABLET BY MOUTH 3 TIMES A DAY AS NEEDED FOR ANXIETY  90 tablet  0  . atenolol (TENORMIN) 50 MG tablet TAKE 1 TABLET BY MOUTH EVERY DAY FOR PALPITATION  90 tablet  1  . cholestyramine (QUESTRAN) 4 G packet Take 1 packet (4 g total) by mouth 2 (two) times daily as needed (diarrhea).  30 each  1  . ciclopirox (PENLAC) 8 % solution Apply topically at bedtime. Apply over nail & skin daily. Apply over previous coat x 7 days, remove with alcohol and continue.  6.6 mL  1  . citalopram (CELEXA) 40 MG  tablet Take 40 mg by mouth every evening.      Marland Kitchen dexamethasone (DECADRON) 4 MG tablet Take 2 tablets (8 mg total) by mouth 2 (two) times daily with a meal. Take two times a day the day before Taxotere. Then take two times a day starting the day after chemo for 3 days.  30 tablet  1  . diphenhydramine-acetaminophen (TYLENOL PM) 25-500 MG TABS Take 2 tablets by mouth at bedtime.      Marland Kitchen HYDROcodone-acetaminophen (NORCO/VICODIN) 5-325 MG per tablet Take 1-2 tablets by mouth every 6 (six) hours as needed.  30 tablet  0  . lidocaine-prilocaine (EMLA) cream Apply topically as needed.  30 g  0  . loperamide (IMODIUM) 2 MG capsule Take 2 mg by mouth as needed for diarrhea or loose stools.      Marland Kitchen LORazepam (ATIVAN) 0.5 MG tablet TAKE 1 TABLET EVERY 6 HOURS AS NEEDED FOR NAUSEA OR VOMITING  30 tablet  0  . nystatin (MYCOSTATIN) 100000 UNIT/ML suspension       . ondansetron (ZOFRAN) 8 MG tablet Take 1 tablet (8 mg total) by mouth 2 (two) times daily. Take two times a day starting the day after chemo for 3 days. Then take two times a day as needed for nausea or vomiting.  30 tablet  1  . potassium chloride (K-DUR) 10 MEQ tablet Take 2 tablets (20 mEq total) by mouth 3 (three) times daily. X 7 days.  Then 2 tabs by mouth twice daily or as directed  180 tablet  1  . prochlorperazine (COMPAZINE) 10 MG tablet Take 1 tablet (10 mg total) by mouth every 6 (six) hours as needed (Nausea or vomiting).  30 tablet  1  . valACYclovir (VALTREX) 1000 MG tablet TAKE 1 TABLET BY MOUTH DAILY  30 tablet  5  . [DISCONTINUED] potassium chloride SA (K-DUR,KLOR-CON) 20 MEQ tablet Take 1 tablet (20 mEq total) by mouth 2 (two) times daily.  20 tablet  1   No current facility-administered medications for this visit.      Allergies  Allergen Reactions  . Doxycycline Other (See Comments)    Unknown    REVIEW OF SYSTEMS: Cardiac:  Palpitations.  Has seen Dr. Dietrich Pates. Gastrointestinal:  Has seen Dr. Michaell Cowing for possible colo-vesicle  fistula, but CT scan was negative.  Had negative colonoscopy by Dr. Karilyn Cota in 11/03/2010.  For now, her fistula evaluation and treatment are on hold. Musculoskeletal:  Osteoarthritis of both hips, on Celebrex.  She cannot remember the name of the orthopedists. Hematologic:  No bleeding disorder.  On Celebrex. Psycho-social:  The patient is oriented.   History of anxiety.  SOCIAL and FAMILY HISTORY: Married. Husband, Christiane Ha. She has 4 children.  3 daughters and 1 son. Ages 62, 47, 47, 29. Works as a Passenger transport manager at [employer]  PHYSICAL EXAM: BP 116/70  Pulse 72  Temp(Src) 98.4 F (36.9 C) (Oral)  Resp 14  Ht 5\' 1"  (1.549 m)  Wt 157 lb 6.4 oz (71.396 kg)  BMI 29.76 kg/m2  LMP 12/04/2008  General: WN AA F who is alert and generally healthy appearing.  NECK:  Supple.  No thyroid mass. LYMPH NODES:  No cervical, supraclavicular, or axillary adenopathy. BREASTS -  RIGHT:  No palpable mass or nodule.  No nipple discharge.  Port in upper inner right breast   LEFT:  Incision looks good.  The drain is putting out between 25 - 40 cc/day.  It has been over 2 weeks since the surgery.  Drain removed. UPPER EXTREMITIES:  No evidence of lymphedema.  DATA REVIEWED: Path report to patient.  Ovidio Kin, MD,  [facility] [address] [place]  [address]   [place], [place]    [address] Phone:  [phone] FAX:  [phone]

## 2013-09-30 ENCOUNTER — Other Ambulatory Visit: Payer: Self-pay | Admitting: *Deleted

## 2013-10-02 ENCOUNTER — Encounter: Payer: Self-pay | Admitting: Physician Assistant

## 2013-10-02 ENCOUNTER — Other Ambulatory Visit (HOSPITAL_BASED_OUTPATIENT_CLINIC_OR_DEPARTMENT_OTHER): Payer: BC Managed Care – PPO

## 2013-10-02 ENCOUNTER — Ambulatory Visit (HOSPITAL_BASED_OUTPATIENT_CLINIC_OR_DEPARTMENT_OTHER): Payer: BC Managed Care – PPO | Admitting: Physician Assistant

## 2013-10-02 ENCOUNTER — Ambulatory Visit (HOSPITAL_BASED_OUTPATIENT_CLINIC_OR_DEPARTMENT_OTHER): Payer: BC Managed Care – PPO

## 2013-10-02 VITALS — BP 107/72 | HR 66 | Temp 98.8°F | Resp 18 | Ht 61.0 in | Wt 158.6 lb

## 2013-10-02 DIAGNOSIS — B351 Tinea unguium: Secondary | ICD-10-CM | POA: Insufficient documentation

## 2013-10-02 DIAGNOSIS — C50419 Malignant neoplasm of upper-outer quadrant of unspecified female breast: Secondary | ICD-10-CM

## 2013-10-02 DIAGNOSIS — C50411 Malignant neoplasm of upper-outer quadrant of right female breast: Secondary | ICD-10-CM

## 2013-10-02 DIAGNOSIS — M2550 Pain in unspecified joint: Secondary | ICD-10-CM | POA: Insufficient documentation

## 2013-10-02 DIAGNOSIS — G8929 Other chronic pain: Secondary | ICD-10-CM

## 2013-10-02 DIAGNOSIS — C50412 Malignant neoplasm of upper-outer quadrant of left female breast: Secondary | ICD-10-CM

## 2013-10-02 DIAGNOSIS — Z170 Estrogen receptor positive status [ER+]: Secondary | ICD-10-CM

## 2013-10-02 DIAGNOSIS — M792 Neuralgia and neuritis, unspecified: Secondary | ICD-10-CM | POA: Insufficient documentation

## 2013-10-02 DIAGNOSIS — L609 Nail disorder, unspecified: Secondary | ICD-10-CM

## 2013-10-02 DIAGNOSIS — Z5112 Encounter for antineoplastic immunotherapy: Secondary | ICD-10-CM

## 2013-10-02 DIAGNOSIS — T451X5A Adverse effect of antineoplastic and immunosuppressive drugs, initial encounter: Secondary | ICD-10-CM

## 2013-10-02 DIAGNOSIS — M25559 Pain in unspecified hip: Secondary | ICD-10-CM

## 2013-10-02 DIAGNOSIS — D6481 Anemia due to antineoplastic chemotherapy: Secondary | ICD-10-CM

## 2013-10-02 LAB — CBC WITH DIFFERENTIAL/PLATELET
BASO%: 0.3 % (ref 0.0–2.0)
Basophils Absolute: 0 10*3/uL (ref 0.0–0.1)
EOS%: 1.3 % (ref 0.0–7.0)
Eosinophils Absolute: 0.1 10*3/uL (ref 0.0–0.5)
HCT: 33.8 % — ABNORMAL LOW (ref 34.8–46.6)
HGB: 11.2 g/dL — ABNORMAL LOW (ref 11.6–15.9)
LYMPH%: 24.4 % (ref 14.0–49.7)
MCH: 31 pg (ref 25.1–34.0)
MCHC: 33.1 g/dL (ref 31.5–36.0)
MCV: 93.7 fL (ref 79.5–101.0)
MONO#: 0.7 10*3/uL (ref 0.1–0.9)
MONO%: 9.3 % (ref 0.0–14.0)
NEUT#: 4.8 10*3/uL (ref 1.5–6.5)
NEUT%: 64.7 % (ref 38.4–76.8)
Platelets: 231 10*3/uL (ref 145–400)
RBC: 3.61 10*6/uL — ABNORMAL LOW (ref 3.70–5.45)
RDW: 15.3 % — ABNORMAL HIGH (ref 11.2–14.5)
WBC: 7.4 10*3/uL (ref 3.9–10.3)
lymph#: 1.8 10*3/uL (ref 0.9–3.3)

## 2013-10-02 LAB — COMPREHENSIVE METABOLIC PANEL (CC13)
ALT: 30 U/L (ref 0–55)
AST: 21 U/L (ref 5–34)
Albumin: 3.7 g/dL (ref 3.5–5.0)
Alkaline Phosphatase: 79 U/L (ref 40–150)
Anion Gap: 11 mEq/L (ref 3–11)
BUN: 10.9 mg/dL (ref 7.0–26.0)
CO2: 26 mEq/L (ref 22–29)
Calcium: 9.6 mg/dL (ref 8.4–10.4)
Chloride: 107 mEq/L (ref 98–109)
Creatinine: 0.8 mg/dL (ref 0.6–1.1)
Glucose: 84 mg/dl (ref 70–140)
Potassium: 3.5 mEq/L (ref 3.5–5.1)
Sodium: 144 mEq/L (ref 136–145)
Total Bilirubin: 0.42 mg/dL (ref 0.20–1.20)
Total Protein: 7.1 g/dL (ref 6.4–8.3)

## 2013-10-02 LAB — MAGNESIUM (CC13): Magnesium: 1.5 mg/dl (ref 1.5–2.5)

## 2013-10-02 MED ORDER — DIPHENHYDRAMINE HCL 25 MG PO CAPS
ORAL_CAPSULE | ORAL | Status: AC
  Filled 2013-10-02: qty 1

## 2013-10-02 MED ORDER — ACETAMINOPHEN 325 MG PO TABS
650.0000 mg | ORAL_TABLET | Freq: Once | ORAL | Status: AC
  Administered 2013-10-02: 650 mg via ORAL

## 2013-10-02 MED ORDER — HEPARIN SOD (PORK) LOCK FLUSH 100 UNIT/ML IV SOLN
500.0000 [IU] | Freq: Once | INTRAVENOUS | Status: AC | PRN
  Administered 2013-10-02: 500 [IU]
  Filled 2013-10-02: qty 5

## 2013-10-02 MED ORDER — TRASTUZUMAB CHEMO INJECTION 440 MG
6.0000 mg/kg | Freq: Once | INTRAVENOUS | Status: AC
  Administered 2013-10-02: 420 mg via INTRAVENOUS
  Filled 2013-10-02: qty 20

## 2013-10-02 MED ORDER — DIPHENHYDRAMINE HCL 25 MG PO CAPS
ORAL_CAPSULE | ORAL | Status: AC
  Filled 2013-10-02: qty 2

## 2013-10-02 MED ORDER — ACETAMINOPHEN 325 MG PO TABS
ORAL_TABLET | ORAL | Status: AC
  Filled 2013-10-02: qty 2

## 2013-10-02 MED ORDER — GABAPENTIN 100 MG PO CAPS: 100.0000 mg | ORAL_CAPSULE | Freq: Two times a day (BID) | ORAL | Status: DC

## 2013-10-02 MED ORDER — SODIUM CHLORIDE 0.9 % IV SOLN
Freq: Once | INTRAVENOUS | Status: AC
  Administered 2013-10-02: 16:00:00 via INTRAVENOUS

## 2013-10-02 MED ORDER — DIPHENHYDRAMINE HCL 25 MG PO CAPS
50.0000 mg | ORAL_CAPSULE | Freq: Once | ORAL | Status: AC
  Administered 2013-10-02: 50 mg via ORAL

## 2013-10-02 MED ORDER — SODIUM CHLORIDE 0.9 % IJ SOLN
10.0000 mL | INTRAMUSCULAR | Status: DC | PRN
  Administered 2013-10-02: 10 mL
  Filled 2013-10-02: qty 10

## 2013-10-02 NOTE — Progress Notes (Signed)
ID: [name] [name] OB: 1960/08/11  MR#: [id number]  SAY#:[id number]  PCP: Harlow Asa, MD ; Sherie Don, NP GYN:   Conley Simmonds, MD  SU: Ovidio Kin, MD RADONC:  Lurline Hare, MD OTHER MD: Regino Schultze, MD;  Estelle Grumbles, MD  CHIEF COMPLAINT:  Left Breast Cancer/Neoadjuvant Chemo   BREAST CANCER HISTORY: [name] palpated a mass in her left breast in late August 2014. She was already scheduled for screening mammography at [facility] and this was performed 03/13/2013. Indeed a possible mass was noted in the left breast and on 03/29/2013 the patient underwent diagnostic left mammography and left ultrasonography. This showed a 2.5 cm irregular mass in the upper outer quadrant of the left breast. This was palpable.by ultrasound there was a 1.6 cm irregular hypoechoic mass in the area in question, and in addition to enlarged left axillary lymph nodes were noted, the largest measuring 2.5 cm.  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  Bilateral breast MRI 04/11/2013 shows 3 abnormal enhancing masses in the upper outer quadrant of the left breast measuring altogether 6.4 cm. The largest separate mass measured 2.7 cm. There were abnormal or large lymph nodes in the left axilla. Ultrasound guided biopsy of the 2 smaller masses has been scheduled.  The patient's subsequent history is as detailed below   INTERVAL HISTORY: Raneshia returns alone today for followup of her left breast cancer.  She completed her neoadjuvant chemotherapy, underwent a left lumpectomy, and is now ready to restart her anti-HER-2/neu therapy with single agent trastuzumab. This  will be given every 3 weeks, and she is due for her treatment today.   [name] is feeling "great" today, almost back to normal. She has healed well from the lumpectomy with no complications other than the expected postsurgical pain. She is having quite a bit of neuropathic pain in the left axilla, and the left upper arm. She tells me that the area burns and is very sensitive to touch, although there no skin changes noted.   The diarrhea has completely resolved. She is no longer utilizing either Questran or Imodium. She's able to eat and drink well, and has had no nausea or emesis. Both her potassium and her magnesium levels have normalized.   in fact, her only other complaints today are some chronic pain affecting her hips and her right shoulder, and some continued issues with nail dyscrasia. She has seen Dr. Darrelyn Hillock in the past for hip pain, and plans to call his office for further evaluation. Once again, this is a chronic problem and has not changed with her recent diagnosis.  Her fingernails are beginning to "dry up" and improve. She continues to use Penlac as prescribed, in addition to tea tree oil. The toe nails on her great toes bilaterally, however, have become more painful because they rub her shoes when she walks. She would like a referral to a podiatrist for further evaluation there. She denies any drainage or bleeding and has had no evidence of frank infection.  REVIEW OF SYSTEMS: Kaari has had no fevers or chills. She denies any additional rashes or skin changes. She's had no signs of abnormal  bruising or bleeding. She denies any mouth ulcers or oral sensitivity. She denies any cough, shortness of breath, orthopnea, peripheral swelling, chest pain, or palpitations. She denies any recent headaches, dizziness or gait disturbance. She does have some slight blurred vision at times. She continues to feel anxious, but this has improved. She has only some very  mild  residual numbness in the tips  of her fingers, which is stable  and is not  affecting her day-to-day activities.  A detailed review of systems is otherwise stable and noncontributory.  PAST MEDICAL HISTORY: Past Medical History  Diagnosis Date  . Chest pain 2009    Consultation-[facility], negative chest CT; nl echo in 2005; h/o palpitations  . Palpitations   . Degenerative joint disease     + degenerative joint disease of the lumbosacral spine  . Colitis 2010    not IBD  . Herpes simplex type II infection   . Allergic rhinitis   . Hypercholesteremia     "slightly high"  . Anxiety   . Heart murmur     "small"  . GERD (gastroesophageal reflux disease)     "a little"  . Breast cancer 03/31/13    left  . Cancer   . Depression   . Hypertension   . Anemia, unspecified 05/30/2013  . Wears glasses     PAST SURGICAL HISTORY: Past Surgical History  Procedure Laterality Date  . Colonoscopy  10/2010    proctitis; melanosis coli  . Tubal ligation    . Breast excisional biopsy  04/2003    Left; benign disease  . Right oophorectomy  03/13/2007  . Abdominal hysterectomy  03/13/2007    TAH ?BSO--Dr Cheron Every, [place]  . Eye surgery Left     "fix lazy eye"  . Portacath placement Right 04/21/2013    Procedure: INSERTION PORT-A-CATH;  Surgeon: Kandis Cocking, MD;  Location: [facility];  Service: General;  Laterality: Right;  . Breast lumpectomy with needle localization and axillary lymph node dissection Left 09/12/2013    Procedure: BREAST LUMPECTOMY WITH NEEDLE LOCALIZATION AND AXILLARY LYMPH NODE DISSECTION;  Surgeon: Kandis Cocking, MD;  Location: [facility];  Service: General;  Laterality: Left;  and axilla  . Breast surgery      FAMILY HISTORY Family History  Problem Relation Age of Onset  . Aneurysm Mother     Cerebral  . Hypertension Mother   . Hyperlipidemia Mother   . Stroke Mother   . Coronary artery disease Father   . Hypertension Father   . Hyperlipidemia Father   . Lung cancer Brother    . Cancer Brother     Lung  The patient's father died at the age of 3 from a myocardial infarction. The patient's mother died at the age of 7 from a stroke. The patient had 6 brothers, 2 sisters. One brother has a history of lung cancer in the setting of tobacco abuse. Another brother had multiple myeloma. There is no history of breast or ovarian cancer in the family  GYNECOLOGIC HISTORY: (Updated 09/25/2013) Menarche age 72, first live birth age 79, the patient is GX P4. She status post abdominal hysterectomy with bilateral salpingo-oophorectomy. She never took hormone replacement.   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 She's currently out of work due to to her therapy. Her husband [name] works in Designer, fashion/clothing. Daughter Candida Peeling lives at home with the patient with her 3 children,  all under the age of 47. She is not employed. Son Roda Trentman lives in [place] and is not employed. Daughter [name] lives in [place] and is a Community education officer. Daughter [name] is a Child psychotherapist in [place]. The patient has 10 grandchildren. She attends the [employer]     ADVANCED DIRECTIVES: Not in place   HEALTH MAINTENANCE:  (Updated 09/25/2013)  History  Substance Use Topics  . Smoking status: Former Smoker -- 0.50 packs/day for 15 years    Types: Cigarettes    Quit date: 07/06/1982  . Smokeless tobacco: Never Used  . Alcohol Use: No     Colonoscopy:2012  PAP:2014   Bone density: Never  Lipid panel: Not on file    Allergies  Allergen Reactions  . Doxycycline Other (See Comments)    Unknown    Current Outpatient Prescriptions  Medication Sig Dispense Refill  . ALPRAZolam (XANAX) 1 MG tablet TAKE 1 TABLET BY MOUTH 3 TIMES A DAY AS NEEDED FOR ANXIETY  90 tablet  0  . atenolol (TENORMIN) 50 MG tablet TAKE 1 TABLET BY MOUTH EVERY DAY FOR PALPITATION  90 tablet  1  .  citalopram (CELEXA) 40 MG tablet Take 40 mg by mouth every evening.      . diphenhydramine-acetaminophen (TYLENOL PM) 25-500 MG TABS Take 2 tablets by mouth at bedtime.      . lidocaine-prilocaine (EMLA) cream Apply topically as needed.  30 g  0  . potassium chloride (K-DUR) 10 MEQ tablet Take 2 tablets (20 mEq total) by mouth 3 (three) times daily. X 7 days.  Then 2 tabs by mouth twice daily or as directed  180 tablet  1  . valACYclovir (VALTREX) 1000 MG tablet TAKE 1 TABLET BY MOUTH DAILY  30 tablet  5  . cholestyramine (QUESTRAN) 4 G packet Take 1 packet (4 g total) by mouth 2 (two) times daily as needed (diarrhea).  30 each  1  . ciclopirox (PENLAC) 8 % solution Apply topically at bedtime. Apply over nail & skin daily. Apply over previous coat x 7 days, remove with alcohol and continue.  6.6 mL  1  . dexamethasone (DECADRON) 4 MG tablet Take 2 tablets (8 mg total) by mouth 2 (two) times daily with a meal. Take two times a day the day before Taxotere. Then take two times a day starting the day after chemo for 3 days.  30 tablet  1  . HYDROcodone-acetaminophen (NORCO/VICODIN) 5-325 MG per tablet Take 1-2 tablets by mouth every 6 (six) hours as needed.  30 tablet  0  . loperamide (IMODIUM) 2 MG capsule Take 2 mg by mouth as needed for diarrhea or loose stools.      Marland Kitchen LORazepam (ATIVAN) 0.5 MG tablet TAKE 1 TABLET EVERY 6 HOURS AS NEEDED FOR NAUSEA OR VOMITING  30 tablet  0  . nystatin (MYCOSTATIN) 100000 UNIT/ML suspension       . ondansetron (ZOFRAN) 8 MG tablet Take 1 tablet (8 mg total) by mouth 2 (two) times daily. Take two times a day starting the day after chemo for 3 days. Then take two times a day as needed for nausea or vomiting.  30 tablet  1  . prochlorperazine (COMPAZINE) 10 MG tablet Take 1 tablet (10 mg total) by mouth every 6 (six) hours as needed (Nausea or vomiting).  30 tablet  1  . [DISCONTINUED] potassium chloride SA (K-DUR,KLOR-CON) 20 MEQ tablet Take 1 tablet (20 mEq  total) by  mouth 2 (two) times daily.  20 tablet  1   No current facility-administered medications for this visit.   Facility-Administered Medications Ordered in Other Visits  Medication Dose Route Frequency Provider Last Rate Last Dose  . sodium chloride 0.9 % injection 10 mL  10 mL Intracatheter PRN Jissel Slavens Allegra Grana, PA-C   10 mL at 10/02/13 1634    OBJECTIVE: Middle-aged [place] American woman who appears comfortable and is in no acute distress Filed Vitals:   10/02/13 1443  BP: 107/72  Pulse: 66  Temp: 98.8 F (37.1 C)  Resp: 18  Body mass index is 29.98 kg/(m^2).  ECOG: 1 Filed Weights   10/02/13 1443  Weight: 158 lb 9.6 oz (71.94 kg)   Physical Exam: HEENT:  Sclerae anicteric.  Oropharynx clear, moist and pink with no  of ulcerations or thrush.   Neck is supple, trachea midline.  NODES:  No cervical or supraclavicular lymphadenopathy palpated.  BREAST EXAM:  deferred. Axillae are benign bilaterally, with no palpable lymphadenopathy.  LUNGS:  Clear to auscultation bilaterally with  good  excursion.   No wheezes or rhonchi HEART:  Regular rate and rhythm. Soft systolic murmur. ABDOMEN:  Soft, nontender. No hepatomegaly or masses . Positive bowel sounds.  MSK:  No focal spinal tenderness to palpation. Good range of motion bilaterally in the upper extremities. EXTREMITIES:  No peripheral edema.  No lymphedema noted in the upper extremities. SKIN: Significant nail dyscrasia consistent with onychomycosis noted bilaterally. The nails are dark, and thickened and are loosening from the nailbed. Skin is mildly hyperpigmented, but otherwise clear with no additional rashes noted. No excessive ecchymoses. No petechiae.  NEURO:  Nonfocal. Well oriented.   appropriate affect.      LAB RESULTS:   Lab Results  Component Value Date   WBC 7.4 10/02/2013   NEUTROABS 4.8 10/02/2013   HGB 11.2* 10/02/2013   HCT 33.8* 10/02/2013   MCV 93.7 10/02/2013   PLT 231 10/02/2013      Chemistry      Component  Value Date/Time   NA 144 10/02/2013 1412   NA 139 06/06/2008 0545   K 3.5 10/02/2013 1412   K 3.8 06/06/2008 0545   CL 104 06/06/2008 0545   CO2 26 10/02/2013 1412   CO2 26 06/06/2008 0545   BUN 10.9 10/02/2013 1412   BUN 6 06/06/2008 0545   CREATININE 0.8 10/02/2013 1412   CREATININE 0.77 06/06/2008 0545      Component Value Date/Time   CALCIUM 9.6 10/02/2013 1412   CALCIUM 8.7 06/06/2008 0545   ALKPHOS 79 10/02/2013 1412   ALKPHOS 62 04/12/2008 1725   AST 21 10/02/2013 1412   AST 21 04/12/2008 1725   ALT 30 10/02/2013 1412   ALT 28 04/12/2008 1725   BILITOT 0.42 10/02/2013 1412   BILITOT 0.5 04/12/2008 1725     Magnesium  1.5  09/25/2013       STUDIES:   Echocardiogram on 08/15/2013 showed an ejection fraction of 55-60%.  This will be due to be repeated again in mid May 2015.   Mr Breast Bilateral W Wo Contrast 08/28/2013   CLINICAL DATA:  Known left breast grade 2-3 invasive ductal carcinoma. Assess response to neoadjuvant chemotherapy.  EXAM: BILATERAL BREAST MRI WITH AND WITHOUT CONTRAST  TECHNIQUE: Multiplanar, multisequence MR images of both breasts were obtained prior to and following the intravenous administration of 14ml of MultiHance  THREE-DIMENSIONAL MR IMAGE RENDERING ON INDEPENDENT WORKSTATION:  Three-dimensional MR images were  rendered by post-processing of the original MR data on an independent workstation. The three-dimensional MR images were interpreted, and findings are reported in the following complete MRI report for this study. Three dimensional images were evaluated at the independent DynaCad workstation  COMPARISON:  Previous MR's of the breast dated 06/12/2013 and 04/11/2013.  FINDINGS: Breast composition:  B:  scattered fibroglandular  Background parenchymal enhancement: Mild to moderate  Right breast: No mass or abnormal enhancement.  Left breast: The previously seen areas of enhancement located within the upper outer quadrant of the left breast are no longer visible.   Lymph nodes: The multiple previously seen enlarged left axillary lymph nodes have considerably decreased in size with the largest remaining lymph node visible measuring 7 mm in size.  Ancillary findings:  None.  IMPRESSION: Excellent response to neoadjuvant chemotherapy with no significant residual enhancing masses visible within the breast. Also, the left axillary lymph nodes have markedly decreased in size.  RECOMMENDATION: Treatment plan.  BI-RADS CATEGORY  6: Known biopsy-proven malignancy - appropriate action should be taken.   Electronically Signed   By: Anselmo Pickler M.D.   On: 08/28/2013 11:27   Surgical Pathology - Left Lumpectomy, 09/12/2013  Diagnosis 1. Breast, lumpectomy, Left - BENIGN BREAST PARENCHYMA WITH TREATMENT RELATED CHANGES. - THERE IS NO EVIDENCE OF MALIGNANCY. - SEE ONCOLOGY TABLE BELOW. 2. Lymph node, sentinel, biopsy, Highest level 2 left Axillary node - THERE IS NO EVIDENCE OF CARCINOMA IN 1 OF 1 LYMPH NODE (0/1). 3. Lymph nodes, regional resection, Left Axilla contents - THERE IS NO EVIDENCE OF CARCINOMA IN 15 OF 15 LYMPH NODES (0/15). - TREATMENT RELATED CHANGES IN MULTIPLE LYMPH NODES. Microscopic Comment 1. BREAST, INVASIVE TUMOR, WITH LYMPH NODE SAMPLING Specimen, including laterality and lymph node sampling (sentinel, non-sentinel): Left breast and left axillary lymph nodes Procedure: Needle localized lumpectomy and axillary dissection Histologic type: N/A Margins: Negative for carcinoma Treatment effect: Significant, present in breast parenchyma and lymph nodes Lymph nodes: Examined: 1 Sentinel 15 Non-sentinel 16 Total Lymph nodes with metastasis: 0 TNM: ypT0, ypN0 (JBK:caf 09/14/13) Pecola Leisure MD Pathologist, Electronic Signature (Case signed 09/14/2013)  ASSESSMENT/PLAN: 53 y.o. [place], [place] woman \  (1)  status post left breast biopsy 03/29/2013 for a clinical T2-T3 pN1, stage IIB-IIIA invasive ductal carcinoma, grade 2-3, estrogen receptor 39%  positive (with moderate staining intensity), progesterone receptor negative, with an MIB-1 of 83%, and HER-2 amplified by CISH, with a ratio of 7.5 and 15 HER-2 copies per cell  (2) neoadjuvant chemotherapy, with trastuzumab/ pertuzumab/ carboplatin/docetaxel started 05/01/2013, to be repeated every 3 weeks x6. Pertuzumab  held after 4 cycles of secondary to diarrhea. The patient received Ne[facility]asta on day 2 at [facility] per her request. Trastuzumab to be continued for total of one year.   (3) Diarrhea most likely secondary to chemotherapy (Pertuzumab), resulting in dehydration --  Resolved   (4) skin rash most likely secondary to pertuzumab: Resolved.   (5) chemotherapy-induced anemia: Iron Indices are normal. Improving  (6)  Hypokalemia -- Resolved, on oral supplementation  (7)  Hypomagnesemia -- Resolved  (8)  status post left lumpectomy under the care of Dr. Ezzard Standing, 09/12/2013, with no residual evidence of malignancy and no evidence of lymph node involvement. A total of 16 lymph nodes were examined (1 Sentinel and 15 non-sentinel), 0 with involvement. YpT0, ypN0.  (9) chemotherapy-induced nail dyscrasia/onychomycosis     PLAN: Annalaya will proceed to treatment today as scheduled for single agent trastuzumab. She will see Dr. Michell Heinrich  later this week and is ready to initiate her adjuvant radiation therapy. We're going to continue following her closely with labs and weekly basis for the next couple of weeks, primarily to follow her magnesium and potassium levels.   I have prescribed a low dose of gabapentin, 100 mg twice daily for what appears to be neuropathic pain in the left arm. She will let me know if that worsens, and we will continue to follow this closely. I do think it is going to be postsurgical in nature, and she also understands that we can increase the dose of gabapentin if necessary. She will see her orthopedic doctor for further evaluation of the hip pain, and right  shoulder pain if needed.  I am also going to refer her to a local podiatrist for further evaluation of her toenails, especially since they have become painful.  Otherwise, we will continue her trastuzumab every 3 weeks and she'll return to see [place] for a physical exam again in 6 weeks, mid May. Prior to her appointment in May, she will need an echocardiogram and that is being scheduled for her as well. All the above was reviewed in detail with the patient today. She voices this understanding and agreement with this plan, and as always knows to call with any changes or problems.   Dillard Essex  Medical Oncology   10/02/2013 6:36 PM

## 2013-10-02 NOTE — Patient Instructions (Signed)
[facility] Discharge Instructions for Patients Receiving Chemotherapy  Today you received the following chemotherapy agents herceptin   To help prevent nausea and vomiting after your treatment, we encourage you to take your nausea medication as directed   If you develop nausea and vomiting that is not controlled by your nausea medication, call the clinic.   BELOW ARE SYMPTOMS THAT SHOULD BE REPORTED IMMEDIATELY:  *FEVER GREATER THAN 100.5 F  *CHILLS WITH OR WITHOUT FEVER  NAUSEA AND VOMITING THAT IS NOT CONTROLLED WITH YOUR NAUSEA MEDICATION  *UNUSUAL SHORTNESS OF BREATH  *UNUSUAL BRUISING OR BLEEDING  TENDERNESS IN MOUTH AND THROAT WITH OR WITHOUT PRESENCE OF ULCERS  *URINARY PROBLEMS  *BOWEL PROBLEMS  UNUSUAL RASH Items with * indicate a potential emergency and should be followed up as soon as possible.  Feel free to call the clinic you have any questions or concerns. The clinic phone number is [phone].  

## 2013-10-03 ENCOUNTER — Other Ambulatory Visit: Payer: Self-pay | Admitting: Physician Assistant

## 2013-10-03 ENCOUNTER — Telehealth: Payer: Self-pay | Admitting: Oncology

## 2013-10-03 NOTE — Telephone Encounter (Signed)
[facility] FOR [name] [name] AT [facility]URE [facility] RE ECHO/BENSIMHON 1ST WK IN MAY - CLINIC WILL CALL PT. ALSO PER NOTES IN REFERRAL [facility] HAS ALREADY ATTEMPTED CONTACT W/PT AND IS WAITING FOR PT TO CALL BACK. NO OTHER ORDERS.

## 2013-10-04 ENCOUNTER — Encounter: Payer: Self-pay | Admitting: Oncology

## 2013-10-04 NOTE — Progress Notes (Addendum)
Location of Breast Cancer:Left breast  Histology per Pathology Report:  Receptor Status: ER(+), PR (-), Her2-neu (+)  Did patient present with symptoms (if so, please note symptoms) or was this found on screening mammography?:Patient palpated mass in upper outer left breast later confirmed by mammogram on 03/13/13 and biopsy on 03/29/13. MRI 04/11/13 showed 3 enhancing masses.in same region.  Past/Anticipated interventions by surgeon, if any 09/12/2013 Diagnosis 1. Breast, lumpectomy, Left - BENIGN BREAST PARENCHYMA WITH TREATMENT RELATED CHANGES. - THERE IS NO EVIDENCE OF MALIGNANCY. - SEE ONCOLOGY TABLE BELOW. 2. Lymph node, sentinel, biopsy, Highest level 2 left Axillary node - THERE IS NO EVIDENCE OF CARCINOMA IN 1 OF 1 LYMPH NODE (0/1). 3. Lymph nodes, regional resection, Left Axilla contents - THERE IS NO EVIDENCE OF CARCINOMA IN 15 OF 15 LYMPH NODES (0/15). - TREATMENT RELATED CHANGES IN MULTIPLE LYMPH NODES. Microscopic Comment 1. BREAST, Past/Anticipated interventions by medical oncology, if any: Chemotherapy:completed neoadjuvant chemotherapy on February 2015. (2) neoadjuvant chemotherapy, with trastuzumab/ pertuzumab/ carboplatin/docetaxel started 05/01/2013, to be repeated every 3 weeks x6. Pertuzumab held after 4 cycles of secondary to diarrhea. The patient received Neulasta on day 2 at  Hospital per her request. Trastuzumab to be continued for total of one year.    Lymphedema issues, if any:Has some swelling and numbness of posterior upper arm.started on gabapentin for this.  Pain issues, if any:No  SAFETY ISSUES:  Prior radiation? 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    ,  Marie, RN 10/04/2013,10:28 AM   

## 2013-10-05 ENCOUNTER — Ambulatory Visit
Admission: RE | Admit: 2013-10-05 | Discharge: 2013-10-05 | Disposition: A | Discharge status: Home / Self Care | Payer: BC Managed Care – PPO | Source: Ambulatory Visit | Attending: Radiation Oncology | Admitting: Radiation Oncology

## 2013-10-05 ENCOUNTER — Other Ambulatory Visit: Payer: Self-pay | Admitting: Radiation Oncology

## 2013-10-05 ENCOUNTER — Encounter: Payer: Self-pay | Admitting: Radiation Oncology

## 2013-10-05 VITALS — BP 118/79 | HR 62 | Temp 98.2°F | Wt 160.4 lb

## 2013-10-05 DIAGNOSIS — R209 Unspecified disturbances of skin sensation: Secondary | ICD-10-CM | POA: Insufficient documentation

## 2013-10-05 DIAGNOSIS — C50411 Malignant neoplasm of upper-outer quadrant of right female breast: Secondary | ICD-10-CM

## 2013-10-05 DIAGNOSIS — C50412 Malignant neoplasm of upper-outer quadrant of left female breast: Secondary | ICD-10-CM

## 2013-10-05 DIAGNOSIS — Z9221 Personal history of antineoplastic chemotherapy: Secondary | ICD-10-CM | POA: Insufficient documentation

## 2013-10-05 DIAGNOSIS — C50419 Malignant neoplasm of upper-outer quadrant of unspecified female breast: Secondary | ICD-10-CM | POA: Insufficient documentation

## 2013-10-05 DIAGNOSIS — Z79899 Other long term (current) drug therapy: Secondary | ICD-10-CM | POA: Insufficient documentation

## 2013-10-05 MED ORDER — RADIAPLEXRX EX GEL
Freq: Once | CUTANEOUS | Status: AC
  Administered 2013-10-05: 11:00:00 via TOPICAL

## 2013-10-05 NOTE — Progress Notes (Signed)
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  She tolerated her chemotherapy relatively well. She had her surgery on March 10 which revealed no evidence of disease in 16 lymph nodes or within the breast. She does have a numbness and occasional pain in her left arm when she stretches it is been performing her stretching exercises. She has not been back to the after breast cancer class. She is looking forward to going back to work. He would like to receive her treatment in [place]. She will continue to receive Herceptin. Allergies:  Allergies  Allergen Reactions  . Doxycycline Other (See Comments)    Unknown    Medications:  Current Outpatient Prescriptions  Medication Sig Dispense Refill  . ALPRAZolam (XANAX) 1 MG tablet TAKE 1 TABLET BY MOUTH 3 TIMES A DAY AS NEEDED FOR ANXIETY  90 tablet  0  . atenolol (TENORMIN) 50 MG tablet TAKE 1 TABLET BY MOUTH EVERY DAY FOR PALPITATION  90 tablet  1  . citalopram (CELEXA) 40 MG tablet Take 40 mg by mouth every evening.      . diphenhydramine-acetaminophen (TYLENOL PM) 25-500 MG TABS Take 2 tablets by mouth at bedtime.      . gabapentin (NEURONTIN) 100 MG capsule Take 100 mg by mouth 2 (two) times daily. Qty 60 tabs with 3 refills started on 10/02/2013.      . hyaluronate sodium (RADIAPLEXRX) GEL Apply 1 application topically 2 (two) times daily.      Marland Kitchen lidocaine-prilocaine (EMLA) cream Apply topically as needed.  30 g  0  . potassium chloride (K-DUR) 10 MEQ tablet Take 2 tablets (20 mEq total) by mouth 3 (three) times daily. X 7 days.  Then 2 tabs by mouth twice daily or as directed  180 tablet  1  . valACYclovir (VALTREX) 1000 MG tablet  TAKE 1 TABLET BY MOUTH DAILY  30 tablet  5  . dexamethasone (DECADRON) 4 MG tablet Take 2 tablets (8 mg total) by mouth 2 (two) times daily with a meal. Take two times a day the day before Taxotere. Then take two times a day starting the day after chemo for 3 days.  30 tablet  1  . HYDROcodone-acetaminophen (NORCO/VICODIN) 5-325 MG per tablet Take 1-2 tablets by mouth every 6 (six) hours as needed.  30 tablet  0  . ondansetron (ZOFRAN) 8 MG tablet Take 1 tablet (8 mg total) by mouth 2 (two) times daily. Take two times a day starting the day after chemo for 3 days. Then take two times a day as needed for nausea or vomiting.  30 tablet  1  . prochlorperazine (COMPAZINE) 10 MG tablet Take 1 tablet (10 mg total) by mouth every 6 (six) hours as needed (Nausea or vomiting).  30 tablet  1  . [DISCONTINUED] potassium chloride SA (K-DUR,KLOR-CON) 20 MEQ tablet Take 1 tablet (20 mEq total) by mouth 2 (two) times daily.  20 tablet  1   No current facility-administered medications for this encounter.    Physical Exam:  Filed Vitals:   10/05/13 1007  BP: 118/79  Pulse: 62  Temp: 98.2 F (36.8 C)  Weight: 160 lb 6.4 oz (72.757 kg)   pleasant  female in no distress sitting comfortably on examining room table. Left breast is healing well. Axillary incision is healing well with no signs of infection. She is alert and oriented x3. She actually has good range of motion of her left arm.  IMPRESSION: [name] [name] is a 53 y.o. female status post neoadjuvant chemotherapy, lumpectomy, and axillary node dissection  PLAN:  I congratulated her on her excellent path report. We discussed the role of radiation and decreasing local failures in patients who undergo lumpectomy. We discussed that this was the case even in the presence of a pathologic complete response. We discussed the process of simulation the placement tattoos. We discussed the use of 3-D conformal techniques for cardiac sparing. We discussed the use of Radiaplex  twice a day to help prevent skin irritation. We discussed temporary and possibly permanent skin darkening as a side effect of treatment. We discussed the low likelihood of symptomatic lung heart rib damage. She has signed informed consent. We will refer her to [facility] for simulation next Friday.    Thea Silversmith, MD

## 2013-10-05 NOTE — Addendum Note (Signed)
Encounter addended by: Jamarious Febo Marie Anne Boltz, RN on: 10/05/2013  4:08 PM<BR>     Documentation filed: Charges VN

## 2013-10-05 NOTE — Progress Notes (Signed)
Please see the Nurse Progress Note in the MD Initial Consult Encounter for this patient. 

## 2013-10-06 NOTE — Addendum Note (Signed)
Encounter addended by: Anastazia Creek Mintz Koula Venier, RN on: 10/06/2013  6:11 PM<BR>     Documentation filed: Charges VN

## 2013-10-09 ENCOUNTER — Other Ambulatory Visit (HOSPITAL_BASED_OUTPATIENT_CLINIC_OR_DEPARTMENT_OTHER): Payer: BC Managed Care – PPO

## 2013-10-09 DIAGNOSIS — C50412 Malignant neoplasm of upper-outer quadrant of left female breast: Secondary | ICD-10-CM

## 2013-10-09 DIAGNOSIS — C50419 Malignant neoplasm of upper-outer quadrant of unspecified female breast: Secondary | ICD-10-CM

## 2013-10-09 LAB — CBC WITH DIFFERENTIAL/PLATELET
BASO%: 0.4 % (ref 0.0–2.0)
Basophils Absolute: 0 10*3/uL (ref 0.0–0.1)
EOS%: 1.9 % (ref 0.0–7.0)
Eosinophils Absolute: 0.1 10*3/uL (ref 0.0–0.5)
HCT: 32.7 % — ABNORMAL LOW (ref 34.8–46.6)
HGB: 10.9 g/dL — ABNORMAL LOW (ref 11.6–15.9)
LYMPH%: 22.3 % (ref 14.0–49.7)
MCH: 31.3 pg (ref 25.1–34.0)
MCHC: 33.4 g/dL (ref 31.5–36.0)
MCV: 93.6 fL (ref 79.5–101.0)
MONO#: 0.6 10*3/uL (ref 0.1–0.9)
MONO%: 9.4 % (ref 0.0–14.0)
NEUT#: 4.3 10*3/uL (ref 1.5–6.5)
NEUT%: 66 % (ref 38.4–76.8)
Platelets: 200 10*3/uL (ref 145–400)
RBC: 3.49 10*6/uL — ABNORMAL LOW (ref 3.70–5.45)
RDW: 15.3 % — ABNORMAL HIGH (ref 11.2–14.5)
WBC: 6.5 10*3/uL (ref 3.9–10.3)
lymph#: 1.4 10*3/uL (ref 0.9–3.3)

## 2013-10-09 LAB — MAGNESIUM (CC13): Magnesium: 1.6 mg/dl (ref 1.5–2.5)

## 2013-10-09 LAB — COMPREHENSIVE METABOLIC PANEL (CC13)
ALT: 26 U/L (ref 0–55)
AST: 22 U/L (ref 5–34)
Albumin: 3.6 g/dL (ref 3.5–5.0)
Alkaline Phosphatase: 62 U/L (ref 40–150)
Anion Gap: 10 mEq/L (ref 3–11)
BUN: 12.2 mg/dL (ref 7.0–26.0)
CO2: 26 mEq/L (ref 22–29)
Calcium: 9.5 mg/dL (ref 8.4–10.4)
Chloride: 108 mEq/L (ref 98–109)
Creatinine: 0.8 mg/dL (ref 0.6–1.1)
Glucose: 98 mg/dl (ref 70–140)
Potassium: 3.9 mEq/L (ref 3.5–5.1)
Sodium: 144 mEq/L (ref 136–145)
Total Bilirubin: 0.35 mg/dL (ref 0.20–1.20)
Total Protein: 6.8 g/dL (ref 6.4–8.3)

## 2013-10-16 ENCOUNTER — Encounter: Payer: Self-pay | Admitting: *Deleted

## 2013-10-16 ENCOUNTER — Other Ambulatory Visit (HOSPITAL_COMMUNITY): Payer: Self-pay

## 2013-10-16 ENCOUNTER — Encounter (HOSPITAL_COMMUNITY): Payer: Self-pay

## 2013-10-16 ENCOUNTER — Other Ambulatory Visit (HOSPITAL_BASED_OUTPATIENT_CLINIC_OR_DEPARTMENT_OTHER): Payer: BC Managed Care – PPO

## 2013-10-16 DIAGNOSIS — C50412 Malignant neoplasm of upper-outer quadrant of left female breast: Secondary | ICD-10-CM

## 2013-10-16 DIAGNOSIS — C50919 Malignant neoplasm of unspecified site of unspecified female breast: Secondary | ICD-10-CM

## 2013-10-16 DIAGNOSIS — C50419 Malignant neoplasm of upper-outer quadrant of unspecified female breast: Secondary | ICD-10-CM

## 2013-10-16 LAB — CBC WITH DIFFERENTIAL/PLATELET
BASO%: 0.2 % (ref 0.0–2.0)
Basophils Absolute: 0 10*3/uL (ref 0.0–0.1)
EOS%: 1.5 % (ref 0.0–7.0)
Eosinophils Absolute: 0.1 10*3/uL (ref 0.0–0.5)
HCT: 33.1 % — ABNORMAL LOW (ref 34.8–46.6)
HGB: 10.8 g/dL — ABNORMAL LOW (ref 11.6–15.9)
LYMPH%: 24.3 % (ref 14.0–49.7)
MCH: 30.1 pg (ref 25.1–34.0)
MCHC: 32.6 g/dL (ref 31.5–36.0)
MCV: 92.2 fL (ref 79.5–101.0)
MONO#: 0.6 10*3/uL (ref 0.1–0.9)
MONO%: 9.5 % (ref 0.0–14.0)
NEUT#: 4.3 10*3/uL (ref 1.5–6.5)
NEUT%: 64.5 % (ref 38.4–76.8)
Platelets: 202 10*3/uL (ref 145–400)
RBC: 3.59 10*6/uL — ABNORMAL LOW (ref 3.70–5.45)
RDW: 14 % (ref 11.2–14.5)
WBC: 6.7 10*3/uL (ref 3.9–10.3)
lymph#: 1.6 10*3/uL (ref 0.9–3.3)

## 2013-10-16 LAB — COMPREHENSIVE METABOLIC PANEL (CC13)
ALT: 21 U/L (ref 0–55)
AST: 20 U/L (ref 5–34)
Albumin: 3.6 g/dL (ref 3.5–5.0)
Alkaline Phosphatase: 65 U/L (ref 40–150)
Anion Gap: 9 mEq/L (ref 3–11)
BUN: 10.8 mg/dL (ref 7.0–26.0)
CO2: 27 mEq/L (ref 22–29)
Calcium: 9.6 mg/dL (ref 8.4–10.4)
Chloride: 110 mEq/L — ABNORMAL HIGH (ref 98–109)
Creatinine: 0.8 mg/dL (ref 0.6–1.1)
Glucose: 88 mg/dl (ref 70–140)
Potassium: 3.7 mEq/L (ref 3.5–5.1)
Sodium: 145 mEq/L (ref 136–145)
Total Bilirubin: 0.43 mg/dL (ref 0.20–1.20)
Total Protein: 6.7 g/dL (ref 6.4–8.3)

## 2013-10-16 LAB — MAGNESIUM (CC13): Magnesium: 1.7 mg/dl (ref 1.5–2.5)

## 2013-10-17 ENCOUNTER — Other Ambulatory Visit: Payer: Self-pay | Admitting: Physician Assistant

## 2013-10-17 DIAGNOSIS — C50411 Malignant neoplasm of upper-outer quadrant of right female breast: Secondary | ICD-10-CM

## 2013-10-17 DIAGNOSIS — C50412 Malignant neoplasm of upper-outer quadrant of left female breast: Secondary | ICD-10-CM

## 2013-10-20 ENCOUNTER — Other Ambulatory Visit: Payer: Self-pay | Admitting: Physician Assistant

## 2013-10-20 DIAGNOSIS — C50412 Malignant neoplasm of upper-outer quadrant of left female breast: Secondary | ICD-10-CM

## 2013-10-20 DIAGNOSIS — C50411 Malignant neoplasm of upper-outer quadrant of right female breast: Secondary | ICD-10-CM

## 2013-10-23 ENCOUNTER — Ambulatory Visit (HOSPITAL_BASED_OUTPATIENT_CLINIC_OR_DEPARTMENT_OTHER): Payer: BC Managed Care – PPO

## 2013-10-23 ENCOUNTER — Other Ambulatory Visit: Payer: BC Managed Care – PPO

## 2013-10-23 ENCOUNTER — Other Ambulatory Visit: Payer: Self-pay | Admitting: Oncology

## 2013-10-23 VITALS — BP 113/73 | HR 65 | Temp 97.5°F | Resp 20

## 2013-10-23 DIAGNOSIS — C50419 Malignant neoplasm of upper-outer quadrant of unspecified female breast: Secondary | ICD-10-CM

## 2013-10-23 DIAGNOSIS — Z5112 Encounter for antineoplastic immunotherapy: Secondary | ICD-10-CM

## 2013-10-23 DIAGNOSIS — C50412 Malignant neoplasm of upper-outer quadrant of left female breast: Secondary | ICD-10-CM

## 2013-10-23 MED ORDER — DIPHENHYDRAMINE HCL 25 MG PO CAPS
50.0000 mg | ORAL_CAPSULE | Freq: Once | ORAL | Status: AC
  Administered 2013-10-23: 50 mg via ORAL

## 2013-10-23 MED ORDER — ACETAMINOPHEN 325 MG PO TABS
ORAL_TABLET | ORAL | Status: AC
  Filled 2013-10-23: qty 2

## 2013-10-23 MED ORDER — SODIUM CHLORIDE 0.9 % IV SOLN
Freq: Once | INTRAVENOUS | Status: AC
  Administered 2013-10-23: 09:00:00 via INTRAVENOUS

## 2013-10-23 MED ORDER — ACETAMINOPHEN 325 MG PO TABS
650.0000 mg | ORAL_TABLET | Freq: Once | ORAL | Status: AC
  Administered 2013-10-23: 650 mg via ORAL

## 2013-10-23 MED ORDER — TRASTUZUMAB CHEMO INJECTION 440 MG
6.0000 mg/kg | Freq: Once | INTRAVENOUS | Status: AC
  Administered 2013-10-23: 420 mg via INTRAVENOUS
  Filled 2013-10-23: qty 20

## 2013-10-23 MED ORDER — HEPARIN SOD (PORK) LOCK FLUSH 100 UNIT/ML IV SOLN
500.0000 [IU] | Freq: Once | INTRAVENOUS | Status: AC | PRN
  Administered 2013-10-23: 500 [IU]
  Filled 2013-10-23: qty 5

## 2013-10-23 MED ORDER — SODIUM CHLORIDE 0.9 % IJ SOLN
10.0000 mL | INTRAMUSCULAR | Status: DC | PRN
  Administered 2013-10-23: 10 mL
  Filled 2013-10-23: qty 10

## 2013-10-23 MED ORDER — DIPHENHYDRAMINE HCL 25 MG PO CAPS
ORAL_CAPSULE | ORAL | Status: AC
  Filled 2013-10-23: qty 2

## 2013-10-23 NOTE — Progress Notes (Signed)
No labs needed today, labs to be every 3 weeks. Labs on 10/16/13 within treatment parameters. Patient has no questions or concerns today.

## 2013-10-23 NOTE — Patient Instructions (Signed)
[name] [name] Discharge Instructions for Patients Receiving Chemotherapy  Today you received the following chemotherapy agents: Herceptin  To help prevent nausea and vomiting after your treatment, we encourage you to take your nausea medication: Zofran 8 mg or Compazine 10 mg as ordered by your physician.    If you develop nausea and vomiting that is not controlled by your nausea medication, call the clinic.   BELOW ARE SYMPTOMS THAT SHOULD BE REPORTED IMMEDIATELY:  *FEVER GREATER THAN 100.5 F  *CHILLS WITH OR WITHOUT FEVER  NAUSEA AND VOMITING THAT IS NOT CONTROLLED WITH YOUR NAUSEA MEDICATION  *UNUSUAL SHORTNESS OF BREATH  *UNUSUAL BRUISING OR BLEEDING  TENDERNESS IN MOUTH AND THROAT WITH OR WITHOUT PRESENCE OF ULCERS  *URINARY PROBLEMS  *BOWEL PROBLEMS  UNUSUAL RASH Items with * indicate a potential emergency and should be followed up as soon as possible.  Feel free to call the clinic you have any questions or concerns. The clinic phone number is [phone].

## 2013-10-24 ENCOUNTER — Other Ambulatory Visit: Payer: Self-pay | Admitting: *Deleted

## 2013-10-24 ENCOUNTER — Ambulatory Visit (INDEPENDENT_AMBULATORY_CARE_PROVIDER_SITE_OTHER): Payer: BC Managed Care – PPO | Admitting: Podiatry

## 2013-10-24 ENCOUNTER — Encounter: Payer: Self-pay | Admitting: Podiatry

## 2013-10-24 VITALS — BP 120/64 | HR 57 | Resp 16 | Ht 61.0 in | Wt 159.0 lb

## 2013-10-24 DIAGNOSIS — L600 Ingrowing nail: Secondary | ICD-10-CM

## 2013-10-24 NOTE — Progress Notes (Signed)
   Subjective:    Patient ID: [name] [name], female    DOB: 1961/04/08, 53 y.o.   MRN: [id number]  HPI Comments: Was having some trouble with these toenails while i was doing chemotherapy. Both great toenails are thick and discolored , curving into skin      Review of Systems  Musculoskeletal:       Joint pain  All other systems reviewed and are negative.      Objective:   Physical Exam: I have reviewed her past medical history medications allergies surgeries and social history. Currently she is finishing a chemotherapy for stretch 3 breast cancer and is starting radiation therapy. She states that she has had some nail changes which are becoming more painful as time goes on. At this point pulses are strongly palpable bilateral. Neurologic sensorium is intact per Semmes-Weinstein monofilament deep tendon reflexes are brisk and equal bilateral. Muscle strength is 5 over 5 dorsiflexors plantar flexors inverters everters all intrinsic musculature is intact. Orthopedic evaluation demonstrates all joints distal to the ankle a full range of motion without crepitation. Mild flexible hammertoe deformities noted bilateral. Cutaneous evaluation demonstrates supple well hydrated cutis with no erythema edema cellulitis drainage or odor. Tibial and fibular nail borders of the hallux bilaterally exquisitely painful on palpation and sharply incurvated.        Assessment & Plan:  Assessment: Ingrown nails hallux bilateral.  Plan: Discussed etiology pathology conservative versus surgical therapies. At this point since she is on chemotherapy and radiation therapy we decided due to the possibility of her decreased immune status to simply do a slant back on her nails they were troublesome today. We did discuss that in the future we may be able to remove the margins permanently so she does not have to continue to get through this. She understands this and is amenable to it when she left she was feeling much  better.

## 2013-10-26 ENCOUNTER — Other Ambulatory Visit: Payer: Self-pay | Admitting: Nurse Practitioner

## 2013-10-31 ENCOUNTER — Ambulatory Visit (HOSPITAL_BASED_OUTPATIENT_CLINIC_OR_DEPARTMENT_OTHER)
Admission: RE | Admit: 2013-10-31 | Discharge: 2013-10-31 | Disposition: A | Discharge status: Home / Self Care | Payer: BC Managed Care – PPO | Source: Ambulatory Visit | Attending: Family Medicine | Admitting: Family Medicine

## 2013-10-31 ENCOUNTER — Ambulatory Visit (HOSPITAL_COMMUNITY)
Admission: RE | Admit: 2013-10-31 | Discharge: 2013-10-31 | Disposition: A | Discharge status: Home / Self Care | Payer: BC Managed Care – PPO | Source: Ambulatory Visit | Attending: Family Medicine | Admitting: Family Medicine

## 2013-10-31 ENCOUNTER — Encounter (HOSPITAL_COMMUNITY): Payer: Self-pay

## 2013-10-31 VITALS — BP 106/66 | HR 63 | Wt 161.8 lb

## 2013-10-31 DIAGNOSIS — Z5111 Encounter for antineoplastic chemotherapy: Secondary | ICD-10-CM

## 2013-10-31 DIAGNOSIS — C50419 Malignant neoplasm of upper-outer quadrant of unspecified female breast: Secondary | ICD-10-CM

## 2013-10-31 DIAGNOSIS — R079 Chest pain, unspecified: Secondary | ICD-10-CM

## 2013-10-31 DIAGNOSIS — Z87891 Personal history of nicotine dependence: Secondary | ICD-10-CM | POA: Insufficient documentation

## 2013-10-31 DIAGNOSIS — Z79899 Other long term (current) drug therapy: Secondary | ICD-10-CM | POA: Insufficient documentation

## 2013-10-31 DIAGNOSIS — R002 Palpitations: Secondary | ICD-10-CM

## 2013-10-31 DIAGNOSIS — C50411 Malignant neoplasm of upper-outer quadrant of right female breast: Secondary | ICD-10-CM

## 2013-10-31 DIAGNOSIS — I10 Essential (primary) hypertension: Secondary | ICD-10-CM

## 2013-10-31 DIAGNOSIS — C50919 Malignant neoplasm of unspecified site of unspecified female breast: Secondary | ICD-10-CM

## 2013-10-31 NOTE — Progress Notes (Signed)
Echocardiogram 2D Echocardiogram has been performed.  Alyson Locket Jesyka Slaght 10/31/2013, 8:23 AM

## 2013-10-31 NOTE — Patient Instructions (Signed)
We will contact you in 3 months to schedule your next appointment and echocardiogram  

## 2013-10-31 NOTE — Progress Notes (Signed)
Patient ID: [name] [name], female   DOB: [birth year], 53 y.o.   MRN: [id number] Oncologist: Dr [name] [name] PCP: [name] Battiest, MD ; [name] Forster, NP  GYN: [name] Half, MD  SU: [name] Overall, MD  [place]: [name] Silversmith, MD  HPI: [name] [name] is a 53 year old with history of L breast cancer diagnosed 03/2013 T2-T3 pN1, stage IIB-IIIA invasive ductal carcinoma, grade 2-3, estrogen receptor 39% positive (with moderate staining intensity), progesterone receptor negative, with an MIB-1 of 83%, and HER-2 amplified by CISH, HTN, Depression, herpes simplex II, DJD, palpitations --on atenolol, referred to the cardio-onc clinic by Dr [name] [name] due to Herceptin.   Started on trastuzumab/ pertuzumab/ carboplatin/docetaxel on 05/01/13. Pertuzumab stopped due to rash.   She completed neoadjuvant chemotherapy and had left lumpectomy.  She is now on Herceptin every 3 wks until 10/15.  Breathing ok. No swelling or CP. Palpitations ok on atenolol.  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 Lives with her husband and daughter. Former smoker quit 15 years ago  FH: Mother: cerebral aneurysm, HTN, Hyperlipidemia, CVA,       Father:  Deceased MI at age 88 and died. CAD, HTN, Hyper lipidemia,        Brother: Lung Cancer   PHYSICAL EXAM: Filed Vitals:   10/31/13 0905  BP: 106/66  Pulse: 63   General:  Well appearing. No respiratory difficulty HEENT: normal Neck: supple. no JVD. Carotids 2+ bilat; no bruits. No lymphadenopathy or thryomegaly appreciated. Cor: PMI nondisplaced. Regular rate & rhythm. No rubs, gallops or murmurs. Lungs: clear Abdomen: soft, nontender, nondistended. No hepatosplenomegaly. No bruits or masses. Good bowel sounds. Extremities: no cyanosis, clubbing, rash, edema Neuro:  alert & oriented x 3, cranial nerves grossly intact. moves all 4 extremities w/o difficulty. Affect pleasant. Skin : Rash noted on trunk    No results found for this or any previous visit (from the past 24 hour(s)). No results found.   ASSESSMENT & PLAN:  1. L Breast Cancer: I reviewed today's echo.  EF and strain are unchanged.  Lateral S' is lower but only limited measurements of this were made.  As global longitudinal strain pattern and EF were stable, I am going to let her continue Herceptin.  Will plan repeat echo and office followup in 3 months.   2. Palpitations: quiescent on atenolol  [name] [name] 10/31/2013

## 2013-11-01 ENCOUNTER — Other Ambulatory Visit: Payer: Self-pay | Admitting: Physician Assistant

## 2013-11-01 DIAGNOSIS — C50419 Malignant neoplasm of upper-outer quadrant of unspecified female breast: Secondary | ICD-10-CM

## 2013-11-13 ENCOUNTER — Ambulatory Visit (HOSPITAL_BASED_OUTPATIENT_CLINIC_OR_DEPARTMENT_OTHER): Payer: BC Managed Care – PPO | Admitting: Oncology

## 2013-11-13 ENCOUNTER — Other Ambulatory Visit (HOSPITAL_BASED_OUTPATIENT_CLINIC_OR_DEPARTMENT_OTHER): Payer: BC Managed Care – PPO

## 2013-11-13 ENCOUNTER — Ambulatory Visit (HOSPITAL_BASED_OUTPATIENT_CLINIC_OR_DEPARTMENT_OTHER): Payer: BC Managed Care – PPO

## 2013-11-13 ENCOUNTER — Telehealth: Payer: Self-pay | Admitting: *Deleted

## 2013-11-13 VITALS — BP 113/70 | HR 51 | Temp 98.1°F | Resp 18 | Ht 61.0 in | Wt 156.2 lb

## 2013-11-13 DIAGNOSIS — Z5112 Encounter for antineoplastic immunotherapy: Secondary | ICD-10-CM

## 2013-11-13 DIAGNOSIS — C50412 Malignant neoplasm of upper-outer quadrant of left female breast: Secondary | ICD-10-CM

## 2013-11-13 DIAGNOSIS — C50419 Malignant neoplasm of upper-outer quadrant of unspecified female breast: Secondary | ICD-10-CM

## 2013-11-13 DIAGNOSIS — Z170 Estrogen receptor positive status [ER+]: Secondary | ICD-10-CM

## 2013-11-13 DIAGNOSIS — C50411 Malignant neoplasm of upper-outer quadrant of right female breast: Secondary | ICD-10-CM

## 2013-11-13 LAB — CBC WITH DIFFERENTIAL/PLATELET
BASO%: 0.1 % (ref 0.0–2.0)
Basophils Absolute: 0 10*3/uL (ref 0.0–0.1)
EOS%: 0.2 % (ref 0.0–7.0)
Eosinophils Absolute: 0 10*3/uL (ref 0.0–0.5)
HCT: 34 % — ABNORMAL LOW (ref 34.8–46.6)
HGB: 11.3 g/dL — ABNORMAL LOW (ref 11.6–15.9)
LYMPH%: 8.1 % — ABNORMAL LOW (ref 14.0–49.7)
MCH: 30.8 pg (ref 25.1–34.0)
MCHC: 33.1 g/dL (ref 31.5–36.0)
MCV: 93 fL (ref 79.5–101.0)
MONO#: 1.1 10*3/uL — ABNORMAL HIGH (ref 0.1–0.9)
MONO%: 11.4 % (ref 0.0–14.0)
NEUT#: 7.6 10*3/uL — ABNORMAL HIGH (ref 1.5–6.5)
NEUT%: 80.2 % — ABNORMAL HIGH (ref 38.4–76.8)
Platelets: 188 10*3/uL (ref 145–400)
RBC: 3.65 10*6/uL — ABNORMAL LOW (ref 3.70–5.45)
RDW: 14.2 % (ref 11.2–14.5)
WBC: 9.5 10*3/uL (ref 3.9–10.3)
lymph#: 0.8 10*3/uL — ABNORMAL LOW (ref 0.9–3.3)

## 2013-11-13 LAB — MAGNESIUM (CC13): Magnesium: 2 mg/dl (ref 1.5–2.5)

## 2013-11-13 LAB — COMPREHENSIVE METABOLIC PANEL (CC13)
ALT: 22 U/L (ref 0–55)
AST: 13 U/L (ref 5–34)
Albumin: 3.8 g/dL (ref 3.5–5.0)
Alkaline Phosphatase: 56 U/L (ref 40–150)
Anion Gap: 9 mEq/L (ref 3–11)
BUN: 17.7 mg/dL (ref 7.0–26.0)
CO2: 27 mEq/L (ref 22–29)
Calcium: 9.7 mg/dL (ref 8.4–10.4)
Chloride: 107 mEq/L (ref 98–109)
Creatinine: 1 mg/dL (ref 0.6–1.1)
Glucose: 100 mg/dl (ref 70–140)
Potassium: 3.9 mEq/L (ref 3.5–5.1)
Sodium: 143 mEq/L (ref 136–145)
Total Bilirubin: 0.32 mg/dL (ref 0.20–1.20)
Total Protein: 7.1 g/dL (ref 6.4–8.3)

## 2013-11-13 MED ORDER — DIPHENHYDRAMINE HCL 25 MG PO CAPS
ORAL_CAPSULE | ORAL | Status: AC
  Filled 2013-11-13: qty 1

## 2013-11-13 MED ORDER — ACETAMINOPHEN 325 MG PO TABS: 650.0000 mg | ORAL_TABLET | Freq: Once | ORAL | Status: DC

## 2013-11-13 MED ORDER — HEPARIN SOD (PORK) LOCK FLUSH 100 UNIT/ML IV SOLN
500.0000 [IU] | Freq: Once | INTRAVENOUS | Status: AC | PRN
  Administered 2013-11-13: 500 [IU]
  Filled 2013-11-13: qty 5

## 2013-11-13 MED ORDER — SODIUM CHLORIDE 0.9 % IV SOLN
Freq: Once | INTRAVENOUS | Status: AC
  Administered 2013-11-13: 14:00:00 via INTRAVENOUS

## 2013-11-13 MED ORDER — DIPHENHYDRAMINE HCL 25 MG PO CAPS: 50.0000 mg | ORAL_CAPSULE | Freq: Once | ORAL | Status: DC

## 2013-11-13 MED ORDER — TRASTUZUMAB CHEMO INJECTION 440 MG
6.0000 mg/kg | Freq: Once | INTRAVENOUS | Status: AC
  Administered 2013-11-13: 420 mg via INTRAVENOUS
  Filled 2013-11-13: qty 20

## 2013-11-13 MED ORDER — SODIUM CHLORIDE 0.9 % IJ SOLN
10.0000 mL | INTRAMUSCULAR | Status: DC | PRN
  Administered 2013-11-13: 10 mL
  Filled 2013-11-13: qty 10

## 2013-11-13 MED ORDER — ACETAMINOPHEN 325 MG PO TABS
ORAL_TABLET | ORAL | Status: AC
  Filled 2013-11-13: qty 2

## 2013-11-13 NOTE — Patient Instructions (Signed)
[facility] Discharge Instructions for Patients Receiving Chemotherapy  Today you received the following chemotherapy agents herceptin  To help prevent nausea and vomiting after your treatment, we encourage you to take your nausea medication as needed   If you develop nausea and vomiting that is not controlled by your nausea medication, call the clinic.   BELOW ARE SYMPTOMS THAT SHOULD BE REPORTED IMMEDIATELY:  *FEVER GREATER THAN 100.5 F  *CHILLS WITH OR WITHOUT FEVER  NAUSEA AND VOMITING THAT IS NOT CONTROLLED WITH YOUR NAUSEA MEDICATION  *UNUSUAL SHORTNESS OF BREATH  *UNUSUAL BRUISING OR BLEEDING  TENDERNESS IN MOUTH AND THROAT WITH OR WITHOUT PRESENCE OF ULCERS  *URINARY PROBLEMS  *BOWEL PROBLEMS  UNUSUAL RASH Items with * indicate a potential emergency and should be followed up as soon as possible.  Feel free to call the clinic you have any questions or concerns. The clinic phone number is [phone].

## 2013-11-13 NOTE — Progress Notes (Signed)
ID: [name] [name] OB: December 07, 1960  MR#: [id number]  SEG#:[id number]  PCP: Rubbie Battiest, MD ; Pearson Forster, NP GYN:   Josefa Half, MD  SU: Alphonsa Overall, MD [place]:  Thea Silversmith, MD OTHER MD: Freida Busman, MD;  Neysa Bonito, MD  CHIEF COMPLAINT:  Left Breast Cancer/currently on radiation therapy and trastuzumab   BREAST CANCER HISTORY: Jazman palpated a mass in her left breast in late August 2014. She was already scheduled for screening mammography at [facility] and this was performed 03/13/2013. Indeed a possible mass was noted in the left breast and on 03/29/2013 the patient underwent diagnostic left mammography and left ultrasonography. This showed a 2.5 cm irregular mass in the upper outer quadrant of the left breast. This was palpable.by ultrasound there was a 1.6 cm irregular hypoechoic mass in the area in question, and in addition to enlarged left axillary lymph nodes were noted, the largest measuring 2.5 cm.  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  Bilateral breast MRI 04/11/2013 shows 3 abnormal enhancing masses in the upper outer quadrant of the left breast measuring altogether 6.4 cm. The largest separate mass measured 2.7 cm. There were abnormal or large lymph nodes in the left axilla. Ultrasound guided biopsy of the 2 smaller masses has been scheduled.  The patient's subsequent history is as detailed below   INTERVAL HISTORY: [name] [name] returns today for followup of her left-sided breast cancer.  She is receiving her radiation treatments in [place]. She tells me she is tolerating them well. She has some fatigue, but went back to work  last week. Her skin is getting dark but there has been no peeling so far  REVIEW OF SYSTEMS: [name] [name] does all her housework in addition to working full-time. She is arthritis pains here in there but these are not more persistent or intense than before. She bruises easily. She would like to change her treatments per Fridays because of work issues and we will be glad to accommodate that. She tells me family is supportive. A detailed review systems today was otherwise noncontributory  PAST MEDICAL HISTORY: Past Medical History  Diagnosis Date  . Chest pain 2009    Consultation-[facility], negative chest CT; nl echo in 2005; h/o palpitations  . Palpitations   . Degenerative joint disease     + degenerative joint disease of the lumbosacral spine  . Colitis 2010    not IBD  . Herpes simplex type II infection   . Allergic rhinitis   . Hypercholesteremia     "slightly high"  . Anxiety   . Heart murmur     "small"  . GERD (gastroesophageal reflux disease)     "a little"  . Breast cancer 03/31/13    left  . Cancer   . Depression   . Hypertension   . Anemia, unspecified 05/30/2013  . Wears glasses     PAST SURGICAL HISTORY: Past Surgical History  Procedure Laterality Date  . Colonoscopy  10/2010    proctitis; melanosis coli  . Tubal ligation    . Breast excisional biopsy  04/2003    Left; benign disease  . Right oophorectomy  03/13/2007  .  Abdominal hysterectomy  03/13/2007    TAH ?BSO--Dr Levin Bacon, [place]  . Eye surgery Left     "fix lazy eye"  . Portacath placement Right 04/21/2013    Procedure: INSERTION PORT-A-CATH;  Surgeon: [name], MD;  Location: [facility];  Service: General;  Laterality: Right;  . Breast lumpectomy with needle localization and axillary lymph node dissection Left 09/12/2013    Procedure: BREAST LUMPECTOMY WITH NEEDLE LOCALIZATION AND AXILLARY LYMPH NODE DISSECTION;  Surgeon: [name], MD;  Location: [facility];  Service: General;   Laterality: Left;  and axilla  . Breast surgery      FAMILY HISTORY Family History  Problem Relation Age of Onset  . Aneurysm Mother     Cerebral  . Hypertension Mother   . Hyperlipidemia Mother   . Stroke Mother   . Coronary artery disease Father   . Hypertension Father   . Hyperlipidemia Father   . Lung cancer Brother   . Cancer Brother     Lung  The patient's father died at the age of 30 from a myocardial infarction. The patient's mother died at the age of 77 from a stroke. The patient had 6 brothers, 2 sisters. One brother has a history of lung cancer in the setting of tobacco abuse. Another brother had multiple myeloma. There is no history of breast or ovarian cancer in the family  GYNECOLOGIC HISTORY: (Updated 09/25/2013) Menarche age 35, first live birth age 35, the patient is [name] [name] P4. She status post abdominal hysterectomy with bilateral salpingo-oophorectomy. She never took hormone replacement.   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 Her husband [name] works in Charity fundraiser. Daughter [name] lives at home with the patient with her 3 children, all under the age of 53. She is not employed. Son Ayline Dingus lives in [place] and is not employed. Daughter Maggie Schwalbe Dillard lives in [place] and is a Barrister's clerk. Daughter Ernst Spell is a Education officer, museum in [place]. The patient has 10 grandchildren. She attends the [employer]     ADVANCED DIRECTIVES: Not in place   HEALTH MAINTENANCE:  (Updated 09/25/2013)  History  Substance Use Topics  . Smoking status: Former Smoker -- 0.50 packs/day for 15 years    Types: Cigarettes    Quit date: 07/06/1982  . Smokeless tobacco: Never Used  . Alcohol Use: No     Colonoscopy:2012  PAP:2014   Bone density: Never  Lipid panel: Not on file    Allergies  Allergen Reactions   . Doxycycline Other (See Comments)    Unknown    Current Outpatient Prescriptions  Medication Sig Dispense Refill  . ALPRAZolam (XANAX) 1 MG tablet TAKE 1 TABLET BY MOUTH 3 TIMES A DAY AS NEEDED FOR ANXIETY  90 tablet  0  . atenolol (TENORMIN) 50 MG tablet Take 1 tablet (50 mg total) by mouth daily.  90 tablet  0  . citalopram (CELEXA) 40 MG tablet Take 40 mg by mouth every evening.      . diphenhydramine-acetaminophen (TYLENOL PM) 25-500 MG TABS Take 2 tablets by mouth at bedtime.      . gabapentin (NEURONTIN) 100 MG capsule Take 100 mg by mouth 2 (two) times daily. Qty 60 tabs with 3 refills started on 10/02/2013.      . hyaluronate sodium (RADIAPLEXRX) GEL Apply 1  application topically 2 (two) times daily.      Marland Kitchen lidocaine-prilocaine (EMLA) cream Apply topically as needed.  30 g  0  . nystatin (MYCOSTATIN) 100000 UNIT/ML suspension       . potassium chloride (K-DUR) 10 MEQ tablet Take 2 tablets (20 mEq total) by mouth 3 (three) times daily. X 7 days.  Then 2 tabs by mouth twice daily or as directed  180 tablet  1  . valACYclovir (VALTREX) 1000 MG tablet TAKE 1 TABLET BY MOUTH DAILY  30 tablet  5  . [DISCONTINUED] potassium chloride SA (K-DUR,KLOR-CON) 20 MEQ tablet Take 1 tablet (20 mEq total) by mouth 2 (two) times daily.  20 tablet  1   No current facility-administered medications for this visit.    OBJECTIVE: Middle-aged [place] American woman who appears stated age 53 Vitals:   11/13/13 1215  BP: 113/70  Pulse: 51  Temp: 98.1 F (36.7 C)  Resp: 18  Body mass index is 29.53 kg/(m^2).  ECOG: 1 Filed Weights   11/13/13 1215  Weight: 156 lb 3.2 oz (70.852 kg)   Sclerae unicteric, pupils equal and reactive Oropharynx clear and moist No cervical or supraclavicular adenopathy Lungs no rales or rhonchi Heart regular rate and rhythm, no murmur appreciated Abd soft, nontender, positive bowel sounds MSK no focal spinal tenderness, no upper extremity lymphedema Neuro:  nonfocal, well oriented, appropriate affect Breasts: The right breast is unremarkable. Port is in place, intact, on the right anterior chest wall. Left breast is status post lumpectomy. It is currently receiving radiation. There is mild hyperpigmentation, most noticeable in the axilla. There is no peeling. There were no suspicious masses. The left axilla is benign.   LAB RESULTS:   Lab Results  Component Value Date   WBC 9.5 11/13/2013   NEUTROABS 7.6* 11/13/2013   HGB 11.3* 11/13/2013   HCT 34.0* 11/13/2013   MCV 93.0 11/13/2013   PLT 188 11/13/2013      Chemistry      Component Value Date/Time   NA 145 10/16/2013 0852   NA 139 06/06/2008 0545   K 3.7 10/16/2013 0852   K 3.8 06/06/2008 0545   CL 104 06/06/2008 0545   CO2 27 10/16/2013 0852   CO2 26 06/06/2008 0545   BUN 10.8 10/16/2013 0852   BUN 6 06/06/2008 0545   CREATININE 0.8 10/16/2013 0852   CREATININE 0.77 06/06/2008 0545      Component Value Date/Time   CALCIUM 9.6 10/16/2013 0852   CALCIUM 8.7 06/06/2008 0545   ALKPHOS 65 10/16/2013 0852   ALKPHOS 62 04/12/2008 1725   AST 20 10/16/2013 0852   AST 21 04/12/2008 1725   ALT 21 10/16/2013 0852   ALT 28 04/12/2008 1725   BILITOT 0.43 10/16/2013 0852   BILITOT 0.5 04/12/2008 1725         STUDIES: ------------------------------------------------------------ Transthoracic Echocardiography  Patient: Cynethia, Schindler MR #: [id number] Study Date: 10/31/2013 Gender: F Age: 31 Height: 154.9cm Weight: 72.1kg BSA: 1.47m2 Pt. Status: Room:  ATTENDING LElane FritzBensimhon, DGlynis SmilesBensimhon, Daniel SONOGRAPHER Jimmy Reel, RDCS PERFORMING Chmg, Outpatient cc:  ------------------------------------------------------------ LV EF: 55% - 60%     ASSESSMENT/PLAN: 53[place] \  (1)  status post left breast biopsy 03/29/2013 for a clinical T2-T3 pN1, stage IIB-IIIA invasive ductal carcinoma, grade 2-3, estrogen  receptor 39% positive (with moderate staining intensity), progesterone receptor negative, with an MIB-1 of 83%, and HER-2 amplified by CISH, with a ratio  of 7.5 and 15 HER-2 copies per cell  (2) neoadjuvant chemotherapy, with trastuzumab/ pertuzumab/ carboplatin/docetaxel started 05/01/2013, to be repeated every 3 weeks x6. Pertuzumab  held after 4 cycles of secondary to diarrhea. The patient received Neulasta on day 2 at [facility] per her request.   (3) Trastuzumab to be continued for total of one year. Most recent echo 10/31/2013 showed EF 55-60%  (4)  status post left lumpectomy 09/12/2013, with a complete pathologic response (aq total of 16 lymph nodes were examined,1 sentinel and 15 non-sentinel),  YpT0, ypN0.  (5) adjuvant radiation at [facility] to be completed early June 2015  (6) anti-estrogen therapy to follow radiation.  (7) status post remote TAH/ BSO  PLAN: Valmai is tolerating her radiation treatments well and should be done with some early June. She would like to change her trastuzumab treatments to Fridays because she is now back to work for her next dose will be June 5. I will see her that day and we will discuss anti-estrogens. Most likely we will start with anastrozole. However I would prefer not to start until mid July, to give her a little time to recover from the radiation therapy.  Tuesday has a good prognosis and our goal of course is to make sure she tolerates the anti-estrogens with minimal side effects, since she has taken for at least 5 years.  She has a good understanding of her oral plan. She agrees with it. She knows a goal of treatment in her cases cure. She will call with any problems that may develop before her next visit here.     Chauncey Cruel, MD  Medical Oncology   11/13/2013 12:21 PM

## 2013-11-13 NOTE — Telephone Encounter (Signed)
Per staff phone call and POF I have schedueld appts.  JMW  

## 2013-11-16 ENCOUNTER — Telehealth: Payer: Self-pay | Admitting: Oncology

## 2013-11-16 NOTE — Telephone Encounter (Signed)
cld pt to adv of sch/left message and will mail copy of sch to pt

## 2013-11-20 ENCOUNTER — Other Ambulatory Visit: Payer: Self-pay | Admitting: Oncology

## 2013-11-21 ENCOUNTER — Telehealth: Payer: Self-pay | Admitting: Oncology

## 2013-11-21 NOTE — Telephone Encounter (Signed)
per email from [facility] [place] ([facility]) moved 6/5 f/u appt to 4pm and per GM pt can start inf before seing him. s/w pt and she is aware of changes lb/tx 1:30pm and GM 4pm on 6/5.

## 2013-11-26 ENCOUNTER — Other Ambulatory Visit: Payer: Self-pay | Admitting: Physician Assistant

## 2013-11-28 NOTE — Telephone Encounter (Signed)
Approve for #60/no refill per Micah Flesher, PA Needs to f/u with PCP regarding anxiety.

## 2013-12-08 ENCOUNTER — Other Ambulatory Visit (HOSPITAL_BASED_OUTPATIENT_CLINIC_OR_DEPARTMENT_OTHER): Payer: BC Managed Care – PPO

## 2013-12-08 ENCOUNTER — Ambulatory Visit (HOSPITAL_BASED_OUTPATIENT_CLINIC_OR_DEPARTMENT_OTHER): Payer: BC Managed Care – PPO

## 2013-12-08 ENCOUNTER — Ambulatory Visit (HOSPITAL_BASED_OUTPATIENT_CLINIC_OR_DEPARTMENT_OTHER): Payer: BC Managed Care – PPO | Admitting: Oncology

## 2013-12-08 VITALS — BP 130/73 | HR 62 | Temp 97.4°F | Resp 18

## 2013-12-08 VITALS — Wt 160.6 lb

## 2013-12-08 DIAGNOSIS — C773 Secondary and unspecified malignant neoplasm of axilla and upper limb lymph nodes: Secondary | ICD-10-CM

## 2013-12-08 DIAGNOSIS — Z5112 Encounter for antineoplastic immunotherapy: Secondary | ICD-10-CM

## 2013-12-08 DIAGNOSIS — C50411 Malignant neoplasm of upper-outer quadrant of right female breast: Secondary | ICD-10-CM

## 2013-12-08 DIAGNOSIS — C50419 Malignant neoplasm of upper-outer quadrant of unspecified female breast: Secondary | ICD-10-CM

## 2013-12-08 DIAGNOSIS — C50412 Malignant neoplasm of upper-outer quadrant of left female breast: Secondary | ICD-10-CM

## 2013-12-08 DIAGNOSIS — R5381 Other malaise: Secondary | ICD-10-CM

## 2013-12-08 DIAGNOSIS — R5383 Other fatigue: Secondary | ICD-10-CM

## 2013-12-08 LAB — CBC WITH DIFFERENTIAL/PLATELET
BASO%: 0.2 % (ref 0.0–2.0)
Basophils Absolute: 0 10*3/uL (ref 0.0–0.1)
EOS%: 0.7 % (ref 0.0–7.0)
Eosinophils Absolute: 0 10*3/uL (ref 0.0–0.5)
HCT: 33.7 % — ABNORMAL LOW (ref 34.8–46.6)
HGB: 11.3 g/dL — ABNORMAL LOW (ref 11.6–15.9)
LYMPH%: 13.8 % — ABNORMAL LOW (ref 14.0–49.7)
MCH: 30.5 pg (ref 25.1–34.0)
MCHC: 33.5 g/dL (ref 31.5–36.0)
MCV: 91.1 fL (ref 79.5–101.0)
MONO#: 0.5 10*3/uL (ref 0.1–0.9)
MONO%: 9.2 % (ref 0.0–14.0)
NEUT#: 4.2 10*3/uL (ref 1.5–6.5)
NEUT%: 76.1 % (ref 38.4–76.8)
Platelets: 153 10*3/uL (ref 145–400)
RBC: 3.7 10*6/uL (ref 3.70–5.45)
RDW: 14.1 % (ref 11.2–14.5)
WBC: 5.5 10*3/uL (ref 3.9–10.3)
lymph#: 0.8 10*3/uL — ABNORMAL LOW (ref 0.9–3.3)

## 2013-12-08 MED ORDER — ACETAMINOPHEN 325 MG PO TABS
ORAL_TABLET | ORAL | Status: AC
  Filled 2013-12-08: qty 2

## 2013-12-08 MED ORDER — TRASTUZUMAB CHEMO INJECTION 440 MG
6.0000 mg/kg | Freq: Once | INTRAVENOUS | Status: AC
  Administered 2013-12-08: 420 mg via INTRAVENOUS
  Filled 2013-12-08: qty 20

## 2013-12-08 MED ORDER — DIPHENHYDRAMINE HCL 25 MG PO CAPS
ORAL_CAPSULE | ORAL | Status: AC
  Filled 2013-12-08: qty 2

## 2013-12-08 MED ORDER — SODIUM CHLORIDE 0.9 % IV SOLN
Freq: Once | INTRAVENOUS | Status: AC
  Administered 2013-12-08: 14:00:00 via INTRAVENOUS

## 2013-12-08 MED ORDER — DIPHENHYDRAMINE HCL 25 MG PO CAPS
50.0000 mg | ORAL_CAPSULE | Freq: Once | ORAL | Status: AC
  Administered 2013-12-08: 50 mg via ORAL

## 2013-12-08 MED ORDER — ACETAMINOPHEN 325 MG PO TABS
650.0000 mg | ORAL_TABLET | Freq: Once | ORAL | Status: AC
  Administered 2013-12-08: 650 mg via ORAL

## 2013-12-08 MED ORDER — ANASTROZOLE 1 MG PO TABS
1.0000 mg | ORAL_TABLET | Freq: Every day | ORAL | Status: DC
Start: 2013-12-08 — End: 2015-01-25

## 2013-12-08 MED ORDER — HEPARIN SOD (PORK) LOCK FLUSH 100 UNIT/ML IV SOLN
500.0000 [IU] | Freq: Once | INTRAVENOUS | Status: AC | PRN
  Administered 2013-12-08: 500 [IU]
  Filled 2013-12-08: qty 5

## 2013-12-08 MED ORDER — SODIUM CHLORIDE 0.9 % IJ SOLN
10.0000 mL | INTRAMUSCULAR | Status: DC | PRN
  Administered 2013-12-08: 10 mL
  Filled 2013-12-08: qty 10

## 2013-12-08 NOTE — Patient Instructions (Signed)

## 2013-12-08 NOTE — Progress Notes (Signed)
ID: [name] [name] OB: 1961-05-24  MR#: [id number]  CSN#:[id number]  PCP: Rubbie Battiest, MD ; Pearson Forster, NP GYN:   Josefa Half, MD  SU: Alphonsa Overall, MD [place]:  Thea Silversmith, MD OTHER MD: Freida Busman, MD;  Neysa Bonito, MD  CHIEF COMPLAINT:  Left Breast Cancer TREATMENT: anti-estrogen therapy, anti-HER-2 therapy  BREAST CANCER HISTORY: From the original intake note 04/12/2013:  [name] [name] palpated a mass in her left breast in late August 2014. She was already scheduled for screening mammography at [facility] and this was performed 03/13/2013. Indeed a possible mass was noted in the left breast and on 03/29/2013 the patient underwent diagnostic left mammography and left ultrasonography. This showed a 2.5 cm irregular mass in the upper outer quadrant of the left breast. This was palpable.by ultrasound there was a 1.6 cm irregular hypoechoic mass in the area in question, and in addition to enlarged left axillary lymph nodes were noted, the largest measuring 2.5 cm.  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  Bilateral breast MRI 04/11/2013 shows 3 abnormal enhancing masses in the upper outer quadrant of the left breast measuring altogether 6.4 cm. The largest separate mass measured 2.7 cm. There were abnormal or large lymph nodes in the left axilla. Ultrasound guided biopsy of the 2 smaller masses has been scheduled.  The patient's subsequent history is as detailed below   INTERVAL HISTORY: [name] [name] returns today for followup of her left-sided breast cancer. Today she completed her radiation treatments, given at the [facility] clinic. She  continues on trastuzumab every 3 weeks. She is see her today to discuss starting antiestrogen therapy  REVIEW OF SYSTEMS: [name] [name] tolerated radiation well, with some desquamation in the inferior breast, which is minimally sore. She has had significant hyperpigmentation. She is also very fatigued. She continues to work full-time. In addition her daughter, who lived in [place] with children aged 50, 35, and 38, is undergoing a separation and has moved in with the patient. This daughter, a Education officer, museum, is currently unemployed in looking for work. There is a younger daughter also at home, who is unemployed. This is a lot of stress on the patient, and is causing her some nuchal headaches, "not too bad". She takes Tylenol for this with some relief. Aside from these issues, a detailed review of systems today was noncontributory  PAST MEDICAL HISTORY: Past Medical History  Diagnosis Date  . Chest pain 2009    Consultation-[facility], negative chest CT; nl echo in 2005; h/o palpitations  . Palpitations   . Degenerative joint disease     + degenerative joint disease of the lumbosacral spine  . Colitis 2010    not IBD  . Herpes simplex type II infection   . Allergic rhinitis   . Hypercholesteremia     "slightly high"  . Anxiety   . Heart murmur     "small"  . GERD (gastroesophageal reflux disease)     "a little"  . Breast cancer 03/31/13    left  . Cancer   . Depression   . Hypertension   . Anemia, unspecified 05/30/2013  . Wears glasses  PAST SURGICAL HISTORY: Past Surgical History  Procedure Laterality Date  . Colonoscopy  10/2010    proctitis; melanosis coli  . Tubal ligation    . Breast excisional biopsy  04/2003    Left; benign disease  . Right oophorectomy  03/13/2007  . Abdominal hysterectomy  03/13/2007    TAH ?BSO--Dr Levin Bacon, [place]  . Eye surgery Left     "fix lazy eye"  . Portacath placement Right 04/21/2013    Procedure: INSERTION PORT-A-CATH;  Surgeon: [name], MD;  Location: [facility];  Service: General;  Laterality: Right;  . Breast lumpectomy with needle localization and axillary lymph node dissection Left 09/12/2013    Procedure: BREAST LUMPECTOMY WITH NEEDLE LOCALIZATION AND AXILLARY LYMPH NODE DISSECTION;  Surgeon: [name], MD;  Location: [place];  Service: General;  Laterality: Left;  and axilla  . Breast surgery      FAMILY HISTORY Family History  Problem Relation Age of Onset  . Aneurysm Mother     Cerebral  . Hypertension Mother   . Hyperlipidemia Mother   . Stroke Mother   . Coronary artery disease Father   . Hypertension Father   . Hyperlipidemia Father   . Lung cancer Brother   . Cancer Brother     Lung  The patient's father died at the age of 73 from a myocardial infarction. The patient's mother died at the age of 54 from a stroke. The patient had 6 brothers, 2 sisters. One brother has a history of lung cancer in the setting of tobacco abuse. Another brother had multiple myeloma. There is no history of breast or ovarian cancer in the family  GYNECOLOGIC HISTORY: (Updated 09/25/2013) Menarche age 8, first live birth age 13, the patient is [place] P4. She status post abdominal hysterectomy with bilateral salpingo-oophorectomy. She never took hormone replacement.   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 Her husband [name] [name] works in Charity fundraiser. Daughter [name] [name] lives at home with the patient with her 3 children, all under the age of 48. She is not employed. Son [name] [name] lives in [place] and is not employed. Daughter [name] [name] lives in [place] and is a Barrister's clerk. Daughter [name] [name] is a Education officer, museum in [place]. The patient has 10 grandchildren. She attends the [employer]     ADVANCED DIRECTIVES: Not in place   HEALTH MAINTENANCE:  (Updated 09/25/2013)  History  Substance Use  Topics  . Smoking status: Former Smoker -- 0.50 packs/day for 15 years    Types: Cigarettes    Quit date: 07/06/1982  . Smokeless tobacco: Never Used  . Alcohol Use: No     Colonoscopy:2012  PAP:2014   Bone density: Never  Lipid panel: Not on file    Allergies  Allergen Reactions  . Doxycycline Other (See Comments)    Unknown    Current Outpatient Prescriptions  Medication Sig Dispense Refill  . ALPRAZolam (XANAX) 1 MG tablet TAKE 1 TABLET BY MOUTH 3 TIMES A DAY AS NEEDED FOR ANXIETY  60 tablet  0  . anastrozole (ARIMIDEX) 1 MG tablet Take 1 tablet (1 mg total) by mouth daily.  90 tablet  4  . atenolol (TENORMIN) 50 MG tablet Take 1 tablet (50 mg total) by mouth daily.  90 tablet  0  . citalopram (CELEXA) 40 MG tablet Take 40 mg by mouth every evening.      Marland Kitchen  diphenhydramine-acetaminophen (TYLENOL PM) 25-500 MG TABS Take 2 tablets by mouth at bedtime.      . gabapentin (NEURONTIN) 100 MG capsule Take 100 mg by mouth 2 (two) times daily. Qty 60 tabs with 3 refills started on 10/02/2013.      . hyaluronate sodium (RADIAPLEXRX) GEL Apply 1 application topically 2 (two) times daily.      Marland Kitchen lidocaine-prilocaine (EMLA) cream Apply topically as needed.  30 g  0  . nystatin (MYCOSTATIN) 100000 UNIT/ML suspension       . potassium chloride (K-DUR) 10 MEQ tablet Take 2 tablets (20 mEq total) by mouth 3 (three) times daily. X 7 days.  Then 2 tabs by mouth twice daily or as directed  180 tablet  1  . valACYclovir (VALTREX) 1000 MG tablet TAKE 1 TABLET BY MOUTH DAILY  30 tablet  5  . [DISCONTINUED] potassium chloride SA (K-DUR,KLOR-CON) 20 MEQ tablet Take 1 tablet (20 mEq total) by mouth 2 (two) times daily.  20 tablet  1   No current facility-administered medications for this visit.    OBJECTIVE: Middle-aged [place] American [name] who appears fatigued There were no vitals filed for this visit.Body mass index is 30.36 kg/(m^2).  ECOG: 1 Filed Weights   12/08/13 1523  Weight: 160 lb 9.6  oz (72.848 kg)   Vitals - 1 value per visit 09/12/7671  SYSTOLIC 419  DIASTOLIC 73  Pulse 62  Temperature 97.4  Respirations 18  Weight (lb)   Height   BMI   VISIT REPORT    [name] [name] unicteric, EOMs intact Oropharynx clear and moist No cervical or supraclavicular adenopathy Lungs no rales or rhonchi Heart regular rate and rhythm Abd soft, nontender, positive bowel sounds MSK no focal spinal tenderness, no upper extremity lymphedema Neuro: nonfocal, well oriented, appropriate affect Breasts: The right breast is unremarkable. Port is intact, on the right anterior chest wall. Left breast is status post lumpectomy and radiation. There is significant hyperpigmentation over the radiation port area. There is dry desquamation in the inferior mammary fold. There are no masses or nipple changes of concern. The left axilla is benign.   LAB RESULTS:   Lab Results  Component Value Date   WBC 5.5 12/08/2013   NEUTROABS 4.2 12/08/2013   HGB 11.3* 12/08/2013   HCT 33.7* 12/08/2013   MCV 91.1 12/08/2013   PLT 153 12/08/2013      Chemistry      Component Value Date/Time   NA 143 11/13/2013 1153   NA 139 06/06/2008 0545   K 3.9 11/13/2013 1153   K 3.8 06/06/2008 0545   CL 104 06/06/2008 0545   CO2 27 11/13/2013 1153   CO2 26 06/06/2008 0545   BUN 17.7 11/13/2013 1153   BUN 6 06/06/2008 0545   CREATININE 1.0 11/13/2013 1153   CREATININE 0.77 06/06/2008 0545      Component Value Date/Time   CALCIUM 9.7 11/13/2013 1153   CALCIUM 8.7 06/06/2008 0545   ALKPHOS 56 11/13/2013 1153   ALKPHOS 62 04/12/2008 1725   AST 13 11/13/2013 1153   AST 21 04/12/2008 1725   ALT 22 11/13/2013 1153   ALT 28 04/12/2008 1725   BILITOT 0.32 11/13/2013 1153   BILITOT 0.5 04/12/2008 1725         STUDIES: ------------------------------------------------------------ Transthoracic Echocardiography  Patient: Aideen, Fenster MR #: [id number] Study Date: 10/31/2013 Gender: F Age: 53 Height: 154.9cm Weight: 72.1kg BSA:  1.27m2 Pt. Status: Room:  ATTENDING LBaltazar ApoREFERRING LBarnet GlasgowBensimhon,  Daniel REFERRING Bensimhon, Daniel SONOGRAPHER Jimmy Reel, RDCS PERFORMING Ch[facility], Outpatient cc:  ------------------------------------------------------------ LV EF: 55% - 60%   ASSESSMENT/PLAN: 53 y.o. [name] [name], [name] [name]   (1)  status post left breast biopsy 03/29/2013 for a clinical T2-T3 pN1, stage IIB-IIIA invasive ductal carcinoma, grade 2-3, estrogen receptor 39% positive (with moderate staining intensity), progesterone receptor negative, with an MIB-1 of 83%, and HER-2 amplified by CISH, with a ratio of 7.5 and 15 HER-2 copies per cell  (2) neoadjuvant chemotherapy, with trastuzumab/ pertuzumab/ carboplatin/ docetaxel repeated every 3 weeks x6, completed 08/21/2013. Pertuzumab  held after 4 cycles secondary to diarrhea. The patient received Neulasta on day 2 at [facility] per her request.   (3) Trastuzumab to be continued for total of one year (to mid October 2015). Most recent echo 10/31/2013 showed EF 55-60%  (4)  status post left lumpectomy and axillary node dissection 09/12/2013, with a complete pathologic response (a total of 16 lymph nodes were examined,1 sentinel and 15 non-sentinel),  YpT0, ypN0.  (5) adjuvant radiation at [facility] completed 12/08/2013  (6) to start anastrozole 01/16/2014  (7) status post remote TAH/ BSO  PLAN: [place] has completed her local treatment for her locally advanced breast cancer, and given the fact that she had a complete pathologic response to neoadjuvant chemotherapy her overall prognosis is good. She will continue the trastuzumab every 3 weeks to complete a year, which will take her to mid October, and she is followed through cardiology with echoes every 3 months.  Today we discussed antiestrogen therapy, and we are going to start with anastrozole. The patient has a good understanding of the possible toxicities, side effects and  complications of this agent. In particular she has never had a bone density, and we will obtain a bone density before her return visit here, in October.  Her social situation is difficult. I have asked our social worker to see if there are some programs in the [place] area where the patient's grandchildren could participate (perhaps a pre-K. and or a sports summer program). The patient is overwhelmed by the social and economic problems she is dealing with, as well as by the fact that she has had breast cancer and even though her prognosis is good, there is a period of adjustment that can be very difficult, in my experience, and can last up to 3 years.  Katti has a good understanding of the overall plan. She agrees with it. She knows the goal of treatment in her case is cure. She will call with any problems that may develop before her next visit here.  Chauncey Cruel, MD  Medical Oncology   12/09/2013 9:39 AM

## 2013-12-11 ENCOUNTER — Telehealth: Payer: Self-pay | Admitting: Oncology

## 2013-12-11 NOTE — Telephone Encounter (Signed)
lvm for pt regarding to SEpt appt...amiled pt avs and letter

## 2013-12-12 ENCOUNTER — Encounter: Payer: Self-pay | Admitting: *Deleted

## 2013-12-12 NOTE — Progress Notes (Signed)
[place] Work  Clinical Social Work was referred by Futures trader for assessment of psychosocial needs.  Clinical Social Worker contacted patient at home to offer support and assess for needs.  Patient stated that she has been experiencing increased emotional stress due to treatment side effect which have caused her to be out of work for an additional 2 weeks.  Patients daughter and grandchildren have been living with her for the past 8 months, and patient expressed feeling overwhelmed with the change in her living situation, diagnosis, treatment, and ability to work.  CSW validated patients feelings and offered additional support.  CSW and patient discussed support services including individual counseling, and support group.  Patient expressed interest in support group and stated she would like to "try support group first" before individual counseling.  CSW provided pt with information on [place] breast cancer support group, and the [facility] which is located close to patients home.  Patient was appreciative of contact and support information, and stated she planned to attend the breast cancer support group this month.  CSW encouraged pt to call with any questions or concerns.     Clinical Social Work interventions: Energy manager information [place] Emotion Support    Johnnye Lana, MSW, [place] Worker [facility] [phone]

## 2013-12-20 ENCOUNTER — Other Ambulatory Visit: Payer: BC Managed Care – PPO

## 2013-12-29 ENCOUNTER — Ambulatory Visit (HOSPITAL_BASED_OUTPATIENT_CLINIC_OR_DEPARTMENT_OTHER): Payer: BC Managed Care – PPO

## 2013-12-29 ENCOUNTER — Other Ambulatory Visit (HOSPITAL_BASED_OUTPATIENT_CLINIC_OR_DEPARTMENT_OTHER): Payer: BC Managed Care – PPO

## 2013-12-29 VITALS — BP 111/70 | HR 61 | Temp 97.4°F | Resp 17

## 2013-12-29 DIAGNOSIS — C773 Secondary and unspecified malignant neoplasm of axilla and upper limb lymph nodes: Secondary | ICD-10-CM

## 2013-12-29 DIAGNOSIS — R5383 Other fatigue: Secondary | ICD-10-CM

## 2013-12-29 DIAGNOSIS — Z5112 Encounter for antineoplastic immunotherapy: Secondary | ICD-10-CM

## 2013-12-29 DIAGNOSIS — C50411 Malignant neoplasm of upper-outer quadrant of right female breast: Secondary | ICD-10-CM

## 2013-12-29 DIAGNOSIS — C50412 Malignant neoplasm of upper-outer quadrant of left female breast: Secondary | ICD-10-CM

## 2013-12-29 DIAGNOSIS — R5381 Other malaise: Secondary | ICD-10-CM

## 2013-12-29 DIAGNOSIS — C50419 Malignant neoplasm of upper-outer quadrant of unspecified female breast: Secondary | ICD-10-CM

## 2013-12-29 LAB — CBC WITH DIFFERENTIAL/PLATELET
BASO%: 0.3 % (ref 0.0–2.0)
Basophils Absolute: 0 10*3/uL (ref 0.0–0.1)
EOS%: 1.6 % (ref 0.0–7.0)
Eosinophils Absolute: 0.1 10*3/uL (ref 0.0–0.5)
HCT: 36.6 % (ref 34.8–46.6)
HGB: 12 g/dL (ref 11.6–15.9)
LYMPH%: 11.4 % — ABNORMAL LOW (ref 14.0–49.7)
MCH: 30 pg (ref 25.1–34.0)
MCHC: 32.7 g/dL (ref 31.5–36.0)
MCV: 91.7 fL (ref 79.5–101.0)
MONO#: 0.7 10*3/uL (ref 0.1–0.9)
MONO%: 12.6 % (ref 0.0–14.0)
NEUT#: 4.3 10*3/uL (ref 1.5–6.5)
NEUT%: 74.1 % (ref 38.4–76.8)
Platelets: 142 10*3/uL — ABNORMAL LOW (ref 145–400)
RBC: 3.99 10*6/uL (ref 3.70–5.45)
RDW: 14.4 % (ref 11.2–14.5)
WBC: 5.8 10*3/uL (ref 3.9–10.3)
lymph#: 0.7 10*3/uL — ABNORMAL LOW (ref 0.9–3.3)

## 2013-12-29 LAB — COMPREHENSIVE METABOLIC PANEL (CC13)
ALT: 28 U/L (ref 0–55)
AST: 19 U/L (ref 5–34)
Albumin: 3.6 g/dL (ref 3.5–5.0)
Alkaline Phosphatase: 61 U/L (ref 40–150)
Anion Gap: 10 mEq/L (ref 3–11)
BUN: 12.9 mg/dL (ref 7.0–26.0)
CO2: 27 mEq/L (ref 22–29)
Calcium: 9.6 mg/dL (ref 8.4–10.4)
Chloride: 105 mEq/L (ref 98–109)
Creatinine: 0.9 mg/dL (ref 0.6–1.1)
Glucose: 88 mg/dl (ref 70–140)
Potassium: 4.1 mEq/L (ref 3.5–5.1)
Sodium: 142 mEq/L (ref 136–145)
Total Bilirubin: 0.26 mg/dL (ref 0.20–1.20)
Total Protein: 7 g/dL (ref 6.4–8.3)

## 2013-12-29 LAB — MAGNESIUM (CC13): Magnesium: 1.8 mg/dl (ref 1.5–2.5)

## 2013-12-29 MED ORDER — DIPHENHYDRAMINE HCL 25 MG PO CAPS
ORAL_CAPSULE | ORAL | Status: AC
  Filled 2013-12-29: qty 2

## 2013-12-29 MED ORDER — SODIUM CHLORIDE 0.9 % IV SOLN
6.0000 mg/kg | Freq: Once | INTRAVENOUS | Status: AC
  Administered 2013-12-29: 420 mg via INTRAVENOUS
  Filled 2013-12-29: qty 20

## 2013-12-29 MED ORDER — ACETAMINOPHEN 325 MG PO TABS
ORAL_TABLET | ORAL | Status: AC
  Filled 2013-12-29: qty 2

## 2013-12-29 MED ORDER — DIPHENHYDRAMINE HCL 25 MG PO CAPS
50.0000 mg | ORAL_CAPSULE | Freq: Once | ORAL | Status: AC
  Administered 2013-12-29: 50 mg via ORAL

## 2013-12-29 MED ORDER — SODIUM CHLORIDE 0.9 % IJ SOLN
10.0000 mL | INTRAMUSCULAR | Status: DC | PRN
  Administered 2013-12-29: 10 mL
  Filled 2013-12-29: qty 10

## 2013-12-29 MED ORDER — ACETAMINOPHEN 325 MG PO TABS
650.0000 mg | ORAL_TABLET | Freq: Once | ORAL | Status: AC
  Administered 2013-12-29: 650 mg via ORAL

## 2013-12-29 MED ORDER — HEPARIN SOD (PORK) LOCK FLUSH 100 UNIT/ML IV SOLN
500.0000 [IU] | Freq: Once | INTRAVENOUS | Status: AC | PRN
  Administered 2013-12-29: 500 [IU]
  Filled 2013-12-29: qty 5

## 2013-12-29 MED ORDER — SODIUM CHLORIDE 0.9 % IV SOLN
Freq: Once | INTRAVENOUS | Status: AC
  Administered 2013-12-29: 10:00:00 via INTRAVENOUS

## 2013-12-29 NOTE — Patient Instructions (Signed)
[facility] Discharge Instructions for Patients Receiving Chemotherapy  Today you received the following chemotherapy agents: Herceptin  To help prevent nausea and vomiting after your treatment, we encourage you to take your nausea medication as prescribed by your physician.    If you develop nausea and vomiting that is not controlled by your nausea medication, call the clinic.   BELOW ARE SYMPTOMS THAT SHOULD BE REPORTED IMMEDIATELY:  *FEVER GREATER THAN 100.5 F  *CHILLS WITH OR WITHOUT FEVER  NAUSEA AND VOMITING THAT IS NOT CONTROLLED WITH YOUR NAUSEA MEDICATION  *UNUSUAL SHORTNESS OF BREATH  *UNUSUAL BRUISING OR BLEEDING  TENDERNESS IN MOUTH AND THROAT WITH OR WITHOUT PRESENCE OF ULCERS  *URINARY PROBLEMS  *BOWEL PROBLEMS  UNUSUAL RASH Items with * indicate a potential emergency and should be followed up as soon as possible.  Feel free to call the clinic you have any questions or concerns. The clinic phone number is [phone].    

## 2014-01-15 ENCOUNTER — Other Ambulatory Visit: Payer: Self-pay | Admitting: Physician Assistant

## 2014-01-15 ENCOUNTER — Other Ambulatory Visit: Payer: Self-pay | Admitting: Family Medicine

## 2014-01-19 ENCOUNTER — Other Ambulatory Visit (HOSPITAL_BASED_OUTPATIENT_CLINIC_OR_DEPARTMENT_OTHER): Payer: BC Managed Care – PPO

## 2014-01-19 ENCOUNTER — Telehealth: Payer: Self-pay | Admitting: *Deleted

## 2014-01-19 ENCOUNTER — Ambulatory Visit (HOSPITAL_BASED_OUTPATIENT_CLINIC_OR_DEPARTMENT_OTHER): Payer: BC Managed Care – PPO

## 2014-01-19 VITALS — BP 107/66 | HR 53 | Temp 97.9°F | Resp 18

## 2014-01-19 DIAGNOSIS — Z5112 Encounter for antineoplastic immunotherapy: Secondary | ICD-10-CM

## 2014-01-19 DIAGNOSIS — C50419 Malignant neoplasm of upper-outer quadrant of unspecified female breast: Secondary | ICD-10-CM

## 2014-01-19 DIAGNOSIS — C773 Secondary and unspecified malignant neoplasm of axilla and upper limb lymph nodes: Secondary | ICD-10-CM

## 2014-01-19 DIAGNOSIS — C50412 Malignant neoplasm of upper-outer quadrant of left female breast: Secondary | ICD-10-CM

## 2014-01-19 DIAGNOSIS — C50411 Malignant neoplasm of upper-outer quadrant of right female breast: Secondary | ICD-10-CM

## 2014-01-19 LAB — CBC WITH DIFFERENTIAL/PLATELET
BASO%: 0.3 % (ref 0.0–2.0)
Basophils Absolute: 0 10*3/uL (ref 0.0–0.1)
EOS%: 5.6 % (ref 0.0–7.0)
Eosinophils Absolute: 0.4 10*3/uL (ref 0.0–0.5)
HCT: 32.6 % — ABNORMAL LOW (ref 34.8–46.6)
HGB: 11.2 g/dL — ABNORMAL LOW (ref 11.6–15.9)
LYMPH%: 14.8 % (ref 14.0–49.7)
MCH: 29.3 pg (ref 25.1–34.0)
MCHC: 34.4 g/dL (ref 31.5–36.0)
MCV: 85.3 fL (ref 79.5–101.0)
MONO#: 0.6 10*3/uL (ref 0.1–0.9)
MONO%: 9.5 % (ref 0.0–14.0)
NEUT#: 4.5 10*3/uL (ref 1.5–6.5)
NEUT%: 69.8 % (ref 38.4–76.8)
Platelets: 167 10*3/uL (ref 145–400)
RBC: 3.82 10*6/uL (ref 3.70–5.45)
RDW: 14.2 % (ref 11.2–14.5)
WBC: 6.4 10*3/uL (ref 3.9–10.3)
lymph#: 1 10*3/uL (ref 0.9–3.3)
nRBC: 0 % (ref 0–0)

## 2014-01-19 MED ORDER — HEPARIN SOD (PORK) LOCK FLUSH 100 UNIT/ML IV SOLN
500.0000 [IU] | Freq: Once | INTRAVENOUS | Status: AC | PRN
  Administered 2014-01-19: 500 [IU]
  Filled 2014-01-19: qty 5

## 2014-01-19 MED ORDER — ACETAMINOPHEN 325 MG PO TABS
ORAL_TABLET | ORAL | Status: AC
  Filled 2014-01-19: qty 2

## 2014-01-19 MED ORDER — ACETAMINOPHEN 325 MG PO TABS
650.0000 mg | ORAL_TABLET | Freq: Once | ORAL | Status: AC
  Administered 2014-01-19: 650 mg via ORAL

## 2014-01-19 MED ORDER — TRASTUZUMAB CHEMO INJECTION 440 MG
6.0000 mg/kg | Freq: Once | INTRAVENOUS | Status: AC
  Administered 2014-01-19: 420 mg via INTRAVENOUS
  Filled 2014-01-19: qty 20

## 2014-01-19 MED ORDER — SODIUM CHLORIDE 0.9 % IJ SOLN
10.0000 mL | INTRAMUSCULAR | Status: DC | PRN
  Administered 2014-01-19: 10 mL
  Filled 2014-01-19: qty 10

## 2014-01-19 MED ORDER — SODIUM CHLORIDE 0.9 % IV SOLN
Freq: Once | INTRAVENOUS | Status: AC
  Administered 2014-01-19: 09:00:00 via INTRAVENOUS

## 2014-01-19 MED ORDER — DIPHENHYDRAMINE HCL 25 MG PO CAPS
ORAL_CAPSULE | ORAL | Status: AC
  Filled 2014-01-19: qty 2

## 2014-01-19 MED ORDER — DIPHENHYDRAMINE HCL 25 MG PO CAPS
50.0000 mg | ORAL_CAPSULE | Freq: Once | ORAL | Status: AC
  Administered 2014-01-19: 50 mg via ORAL

## 2014-01-19 NOTE — Patient Instructions (Signed)
[facility] Discharge Instructions for Patients Receiving Chemotherapy  Today you received the following chemotherapy agents Herceptin  To help prevent nausea and vomiting after your treatment, we encourage you to take your nausea medication     If you develop nausea and vomiting that is not controlled by your nausea medication, call the clinic.   BELOW ARE SYMPTOMS THAT SHOULD BE REPORTED IMMEDIATELY:  *FEVER GREATER THAN 100.5 F  *CHILLS WITH OR WITHOUT FEVER  NAUSEA AND VOMITING THAT IS NOT CONTROLLED WITH YOUR NAUSEA MEDICATION  *UNUSUAL SHORTNESS OF BREATH  *UNUSUAL BRUISING OR BLEEDING  TENDERNESS IN MOUTH AND THROAT WITH OR WITHOUT PRESENCE OF ULCERS  *URINARY PROBLEMS  *BOWEL PROBLEMS  UNUSUAL RASH Items with * indicate a potential emergency and should be followed up as soon as possible.  Feel free to call the clinic you have any questions or concerns. The clinic phone number is [phone].    

## 2014-01-19 NOTE — Telephone Encounter (Signed)
Per desk RN I have scheduled appt

## 2014-01-22 ENCOUNTER — Other Ambulatory Visit: Payer: Self-pay | Admitting: *Deleted

## 2014-01-24 ENCOUNTER — Telehealth: Payer: Self-pay | Admitting: Oncology

## 2014-01-24 NOTE — Telephone Encounter (Signed)
Sent michelle an staff messagel to schedule the chemo appts for aug and sept.

## 2014-01-25 ENCOUNTER — Telehealth: Payer: Self-pay | Admitting: *Deleted

## 2014-01-25 NOTE — Telephone Encounter (Signed)
Per staff message and POF I have scheduled appts. Advised scheduler of appts. JMW  

## 2014-01-27 ENCOUNTER — Telehealth: Payer: Self-pay | Admitting: Oncology

## 2014-01-27 NOTE — Telephone Encounter (Signed)
S/w the pt and she is aware of her appt on 02/09/2014 and to pick up the rest of her schedules at that time

## 2014-01-31 ENCOUNTER — Encounter (HOSPITAL_COMMUNITY): Payer: Self-pay | Admitting: Vascular Surgery

## 2014-02-01 ENCOUNTER — Other Ambulatory Visit: Payer: Self-pay | Admitting: *Deleted

## 2014-02-01 NOTE — Progress Notes (Signed)
This RN received message inquiring why new chemo appointments are 3 hours vs usaully 30 minutes? " I called and informed that Dr Jana Hakim added the perjeta back on my schedule "  " please call and tell me why ?"  This RN reviewed pt's chart and noted per POF placed by this RN per request of chemo room nurse for additional scheduling of perjeta and herceptin.  Per further review - pt has no active orders for perjeta - only has herceptin orders. perjeta was d/ced several months ago.  This RN sent new POF to reflect appropriate change in time for chemo.  Called and left a detailed message on [name] [name]'s VM per above.

## 2014-02-05 ENCOUNTER — Other Ambulatory Visit: Payer: Self-pay | Admitting: Family Medicine

## 2014-02-06 ENCOUNTER — Telehealth: Payer: Self-pay | Admitting: *Deleted

## 2014-02-06 NOTE — Telephone Encounter (Signed)
Per POF staff message scheduled appts. Advised scheduler 

## 2014-02-08 ENCOUNTER — Other Ambulatory Visit: Payer: Self-pay | Admitting: Emergency Medicine

## 2014-02-08 ENCOUNTER — Other Ambulatory Visit (HOSPITAL_COMMUNITY): Payer: Self-pay | Admitting: Family Medicine

## 2014-02-08 DIAGNOSIS — C50412 Malignant neoplasm of upper-outer quadrant of left female breast: Secondary | ICD-10-CM

## 2014-02-09 ENCOUNTER — Other Ambulatory Visit: Payer: Self-pay | Admitting: *Deleted

## 2014-02-09 ENCOUNTER — Ambulatory Visit (HOSPITAL_BASED_OUTPATIENT_CLINIC_OR_DEPARTMENT_OTHER): Payer: BC Managed Care – PPO

## 2014-02-09 ENCOUNTER — Other Ambulatory Visit (HOSPITAL_BASED_OUTPATIENT_CLINIC_OR_DEPARTMENT_OTHER): Payer: BC Managed Care – PPO

## 2014-02-09 ENCOUNTER — Ambulatory Visit: Payer: BC Managed Care – PPO

## 2014-02-09 ENCOUNTER — Other Ambulatory Visit: Payer: Self-pay | Admitting: Oncology

## 2014-02-09 VITALS — BP 115/65 | HR 51 | Temp 97.9°F | Resp 18

## 2014-02-09 DIAGNOSIS — C50412 Malignant neoplasm of upper-outer quadrant of left female breast: Secondary | ICD-10-CM

## 2014-02-09 DIAGNOSIS — E669 Obesity, unspecified: Secondary | ICD-10-CM

## 2014-02-09 DIAGNOSIS — C50419 Malignant neoplasm of upper-outer quadrant of unspecified female breast: Secondary | ICD-10-CM

## 2014-02-09 DIAGNOSIS — Z95828 Presence of other vascular implants and grafts: Secondary | ICD-10-CM

## 2014-02-09 DIAGNOSIS — Z5112 Encounter for antineoplastic immunotherapy: Secondary | ICD-10-CM

## 2014-02-09 DIAGNOSIS — C773 Secondary and unspecified malignant neoplasm of axilla and upper limb lymph nodes: Secondary | ICD-10-CM

## 2014-02-09 LAB — LIPID PANEL
Cholesterol: 224 mg/dL — ABNORMAL HIGH (ref 0–200)
HDL: 62 mg/dL (ref >39)
LDL Cholesterol: 147 mg/dL — ABNORMAL HIGH (ref 0–99)
Total CHOL/HDL Ratio: 3.6 Ratio
Triglycerides: 73 mg/dL (ref <150)
VLDL: 15 mg/dL (ref 0–40)

## 2014-02-09 LAB — COMPREHENSIVE METABOLIC PANEL (CC13)
ALT: 17 U/L (ref 0–55)
AST: 16 U/L (ref 5–34)
Albumin: 3.6 g/dL (ref 3.5–5.0)
Alkaline Phosphatase: 55 U/L (ref 40–150)
Anion Gap: 9 mEq/L (ref 3–11)
BUN: 11.1 mg/dL (ref 7.0–26.0)
CO2: 27 mEq/L (ref 22–29)
Calcium: 9.4 mg/dL (ref 8.4–10.4)
Chloride: 107 mEq/L (ref 98–109)
Creatinine: 0.9 mg/dL (ref 0.6–1.1)
Glucose: 82 mg/dl (ref 70–140)
Potassium: 3.7 mEq/L (ref 3.5–5.1)
Sodium: 142 mEq/L (ref 136–145)
Total Bilirubin: 0.51 mg/dL (ref 0.20–1.20)
Total Protein: 6.8 g/dL (ref 6.4–8.3)

## 2014-02-09 LAB — CBC WITH DIFFERENTIAL/PLATELET
BASO%: 0.5 % (ref 0.0–2.0)
Basophils Absolute: 0 10*3/uL (ref 0.0–0.1)
EOS%: 4.1 % (ref 0.0–7.0)
Eosinophils Absolute: 0.3 10*3/uL (ref 0.0–0.5)
HCT: 33.4 % — ABNORMAL LOW (ref 34.8–46.6)
HGB: 10.8 g/dL — ABNORMAL LOW (ref 11.6–15.9)
LYMPH%: 18.9 % (ref 14.0–49.7)
MCH: 28.7 pg (ref 25.1–34.0)
MCHC: 32.4 g/dL (ref 31.5–36.0)
MCV: 88.7 fL (ref 79.5–101.0)
MONO#: 0.6 10*3/uL (ref 0.1–0.9)
MONO%: 9.4 % (ref 0.0–14.0)
NEUT#: 4.1 10*3/uL (ref 1.5–6.5)
NEUT%: 67.1 % (ref 38.4–76.8)
Platelets: 185 10*3/uL (ref 145–400)
RBC: 3.76 10*6/uL (ref 3.70–5.45)
RDW: 14.9 % — ABNORMAL HIGH (ref 11.2–14.5)
WBC: 6.1 10*3/uL (ref 3.9–10.3)
lymph#: 1.2 10*3/uL (ref 0.9–3.3)

## 2014-02-09 LAB — MAGNESIUM (CC13): Magnesium: 1.9 mg/dl (ref 1.5–2.5)

## 2014-02-09 MED ORDER — ACETAMINOPHEN 325 MG PO TABS
650.0000 mg | ORAL_TABLET | Freq: Once | ORAL | Status: AC
  Administered 2014-02-09: 650 mg via ORAL

## 2014-02-09 MED ORDER — DIPHENHYDRAMINE HCL 25 MG PO CAPS
50.0000 mg | ORAL_CAPSULE | Freq: Once | ORAL | Status: AC
  Administered 2014-02-09: 50 mg via ORAL

## 2014-02-09 MED ORDER — ACETAMINOPHEN 325 MG PO TABS
ORAL_TABLET | ORAL | Status: AC
  Filled 2014-02-09: qty 2

## 2014-02-09 MED ORDER — SODIUM CHLORIDE 0.9 % IV SOLN
Freq: Once | INTRAVENOUS | Status: AC
Start: 2014-02-09 — End: 2014-02-09
  Administered 2014-02-09: 10:00:00 via INTRAVENOUS

## 2014-02-09 MED ORDER — SODIUM CHLORIDE 0.9 % IJ SOLN
10.0000 mL | INTRAMUSCULAR | Status: DC | PRN
  Administered 2014-02-09: 10 mL
  Filled 2014-02-09: qty 10

## 2014-02-09 MED ORDER — HEPARIN SOD (PORK) LOCK FLUSH 100 UNIT/ML IV SOLN
500.0000 [IU] | Freq: Once | INTRAVENOUS | Status: AC | PRN
  Administered 2014-02-09: 500 [IU]
  Filled 2014-02-09: qty 5

## 2014-02-09 MED ORDER — TRASTUZUMAB CHEMO INJECTION 440 MG
6.0000 mg/kg | Freq: Once | INTRAVENOUS | Status: AC
  Administered 2014-02-09: 420 mg via INTRAVENOUS
  Filled 2014-02-09: qty 20

## 2014-02-09 MED ORDER — DIPHENHYDRAMINE HCL 25 MG PO CAPS
ORAL_CAPSULE | ORAL | Status: AC
  Filled 2014-02-09: qty 2

## 2014-02-09 MED ORDER — SODIUM CHLORIDE 0.9 % IJ SOLN
10.0000 mL | INTRAMUSCULAR | Status: DC | PRN
  Administered 2014-02-09: 10 mL via INTRAVENOUS
  Filled 2014-02-09: qty 10

## 2014-02-09 NOTE — Patient Instructions (Signed)
[facility] Discharge Instructions for Patients Receiving Chemotherapy  Today you received the following chemotherapy agents: herceptin  To help prevent nausea and vomiting after your treatment, we encourage you to take your nausea medication.  Take it as often as prescribed.     If you develop nausea and vomiting that is not controlled by your nausea medication, call the clinic. If it is after clinic hours your family physician or the after hours number for the clinic or go to the Emergency Department.   BELOW ARE SYMPTOMS THAT SHOULD BE REPORTED IMMEDIATELY:  *FEVER GREATER THAN 100.5 F  *CHILLS WITH OR WITHOUT FEVER  NAUSEA AND VOMITING THAT IS NOT CONTROLLED WITH YOUR NAUSEA MEDICATION  *UNUSUAL SHORTNESS OF BREATH  *UNUSUAL BRUISING OR BLEEDING  TENDERNESS IN MOUTH AND THROAT WITH OR WITHOUT PRESENCE OF ULCERS  *URINARY PROBLEMS  *BOWEL PROBLEMS  UNUSUAL RASH Items with * indicate a potential emergency and should be followed up as soon as possible.  Feel free to call the clinic you have any questions or concerns. The clinic phone number is [phone].   I have been informed and understand all the instructions given to me. I know to contact the clinic, my physician, or go to the Emergency Department if any problems should occur. I do not have any questions at this time, but understand that I may call the clinic during office hours   should I have any questions or need assistance in obtaining follow up care.    __________________________________________  _____________  __________ Signature of Patient or Authorized Representative            Date                   Time    __________________________________________ Nurse's Signature    

## 2014-02-09 NOTE — Progress Notes (Signed)
This RN spoke with pt per her call wanting to know if she can take celebrex with current regimen for bursitis pain increased since starting the Arimidex.  Per discussion this RN inquired with pt questions per form received for " work accommodations "  Per discussion [name] [name] states she has noticed feeling of fullness under the surgical arm.  She is currently on lift restrictions ( no more then 10bs ).  Plan per phone discussion is referral placed to the lymphedema clinic.  Form for work accommodations will be completed with above restrictions at this time.

## 2014-02-09 NOTE — Progress Notes (Signed)
Medical Oncology  Dr Jana Hakim not available. Herceptin orders for today signed by this MD at staff request.  Godfrey Pick, MD

## 2014-02-09 NOTE — Patient Instructions (Signed)

## 2014-02-14 ENCOUNTER — Telehealth: Payer: Self-pay

## 2014-02-14 DIAGNOSIS — C50412 Malignant neoplasm of upper-outer quadrant of left female breast: Secondary | ICD-10-CM

## 2014-02-14 NOTE — Telephone Encounter (Signed)
Let pt know ok to take celebrex with arimidex and herceptin.  Pt requested out of work letter for herceptin treatment days.  Letter and scrip limiting pt to 40 hr work week both mailed to [employer] in [place].  Let pt know referral in for lymphedema clinic.  Pt voiced understanding.

## 2014-02-20 ENCOUNTER — Ambulatory Visit: Payer: BC Managed Care – PPO | Attending: Oncology | Admitting: Physical Therapy

## 2014-02-20 DIAGNOSIS — C50919 Malignant neoplasm of unspecified site of unspecified female breast: Secondary | ICD-10-CM | POA: Diagnosis not present

## 2014-02-20 DIAGNOSIS — Z9221 Personal history of antineoplastic chemotherapy: Secondary | ICD-10-CM | POA: Diagnosis not present

## 2014-02-20 DIAGNOSIS — I890 Lymphedema, not elsewhere classified: Secondary | ICD-10-CM | POA: Diagnosis not present

## 2014-02-20 DIAGNOSIS — Z923 Personal history of irradiation: Secondary | ICD-10-CM | POA: Insufficient documentation

## 2014-02-20 DIAGNOSIS — IMO0001 Reserved for inherently not codable concepts without codable children: Secondary | ICD-10-CM | POA: Diagnosis present

## 2014-02-23 ENCOUNTER — Encounter: Payer: Self-pay | Admitting: Nurse Practitioner

## 2014-02-23 ENCOUNTER — Ambulatory Visit (INDEPENDENT_AMBULATORY_CARE_PROVIDER_SITE_OTHER): Payer: BC Managed Care – PPO | Admitting: Nurse Practitioner

## 2014-02-23 VITALS — BP 120/76 | Ht 61.0 in | Wt 154.0 lb

## 2014-02-23 DIAGNOSIS — K2970 Gastritis, unspecified, without bleeding: Secondary | ICD-10-CM

## 2014-02-23 DIAGNOSIS — K219 Gastro-esophageal reflux disease without esophagitis: Secondary | ICD-10-CM

## 2014-02-23 DIAGNOSIS — K2990 Gastroduodenitis, unspecified, without bleeding: Secondary | ICD-10-CM

## 2014-02-23 MED ORDER — CITALOPRAM HYDROBROMIDE 40 MG PO TABS: ORAL_TABLET | ORAL | Status: DC

## 2014-02-23 MED ORDER — PANTOPRAZOLE SODIUM 40 MG PO TBEC: 40.0000 mg | DELAYED_RELEASE_TABLET | Freq: Every day | ORAL | Status: DC

## 2014-02-23 MED ORDER — SUCRALFATE 1 G PO TABS: 1.0000 g | ORAL_TABLET | Freq: Three times a day (TID) | ORAL | Status: DC

## 2014-02-26 ENCOUNTER — Telehealth: Payer: Self-pay | Admitting: *Deleted

## 2014-02-26 NOTE — Telephone Encounter (Signed)
Received notes from [facility] evaluation. Sent to scan

## 2014-02-28 ENCOUNTER — Ambulatory Visit (HOSPITAL_COMMUNITY)
Admission: RE | Admit: 2014-02-28 | Discharge: 2014-02-28 | Disposition: A | Discharge status: Home / Self Care | Payer: BC Managed Care – PPO | Source: Ambulatory Visit | Attending: Family Medicine | Admitting: Family Medicine

## 2014-02-28 ENCOUNTER — Encounter: Payer: Self-pay | Admitting: Nurse Practitioner

## 2014-02-28 DIAGNOSIS — I972 Postmastectomy lymphedema syndrome: Secondary | ICD-10-CM | POA: Insufficient documentation

## 2014-02-28 DIAGNOSIS — IMO0001 Reserved for inherently not codable concepts without codable children: Secondary | ICD-10-CM | POA: Diagnosis not present

## 2014-02-28 NOTE — Progress Notes (Signed)
Physical Therapy Treatment Patient Details  Name: [name] [name] MRN: [id number] Date of Birth: 04-03-1961  Today's Date: 02/28/2014 Time: 0973-5329 PT Time Calculation (min): 40 min Manual x 40 Visit#: 2 of 4  Re-eval: 03/30/14   Subjective: Symptoms/Limitations Symptoms: [name] [name] states that her Lt arm has felt, "full", for the past several months with increased swelling noted underneath her arm.  She states that she went to [address] for the initial evaluatin where she was measured, however, she was not given any information on lymphedema.   Pain Assessment Currently in Pain?: No/denies      Exercise/Treatments     Manual Therapy Manual Therapy: Other (comment) Other Manual Therapy: Pt recieved manual decongestive techniques to include supraclavicular, deep and superficial abdominal followed by routing fluid using intraaxillary anastomsis as well as Lt axillary-inguinal anastomis; ending with Lt UE.  Decongestive techniques were completed both anteriorly and posteriorly.   Physical Therapy Assessment and Plan PT Assessment and Plan Clinical Impression Statement: Pt given instructional sheet on lymphedema as well as self massage.  Pt instructed how the lymph system works and how it is unable to repair itself. Pt verbalized understanding of this and the need to aquire a compression garment.  PT Plan: continue for one more week,  Review self massage technique, see if prescription for garment has come in     Goals PT Long Term Goals Time to Complete Long Term Goals: 4 weeks PT Long Term Goal 1: completed by initial therapist on 02/20/2014-verbalize good understanding of the maintenace phase of treatment including manula lymph drainage, use of compression and lymphedema risk reduction practices.  PT Long Term Goal 2: reduce edema by 50% per pt report of "fullness" Long Term Goal 3: I with HEP   Problem List Patient Active Problem List   Diagnosis Date Noted  . Joint pain  10/02/2013  . Neuropathic pain 10/02/2013  . Onychomycosis of toenail 10/02/2013  . Frequent headaches 06/19/2013  . Anxiety as acute reaction to exceptional stress 05/22/2013  . Breast cancer of upper-outer quadrant of left female breast 04/06/2013  . 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 Obesity (BMI 30-39.9) 12/14/2012  . GERD (gastroesophageal reflux disease) 12/14/2012  . Chronic depression 12/14/2012  . Heart murmur, systolic 92/42/6834  . Essential hypertension 09/02/2012  . Chest pain   . Palpitations   . DEGENERATIVE JOINT DISEASE, RIGHT KNEE 10/03/2009  . [place] DEGENERATION 10/24/2007    PT Plan of Care PT Home Exercise Plan: given educational materaial as well as web site   GP    [name] [name],[name] [name] 02/28/2014, 12:15 PM

## 2014-02-28 NOTE — Progress Notes (Signed)
Subjective:  Presents for complaints of acid reflux and heartburn off and on for the past 3 weeks. Has about 2-3 days out of the week. No fever. No abdominal pain. Slight nausea, no vomiting. Started Celebrex for myalgias related to her breast cancer treatment. This is been working very well. No diarrhea or constipation. No blood in her stool. Limited caffeine intake. Nonsmoker. No alcohol use. Has gone back to work. Anxiety and depression overall have been stable on Celexa, continues to have significant issues at home.  Objective:   BP 120/76  Ht 5\' 1"  (1.549 m)  Wt 154 lb (69.854 kg)  BMI 29.11 kg/m2  LMP 12/04/2008 NAD. Alert, oriented. Lungs clear. Heart regular rhythm. Abdomen soft nondistended with moderate distinct epigastric area tenderness, exam otherwise benign.  Assessment:  Problem List Items Addressed This Visit     Digestive   GERD (gastroesophageal reflux disease)   Relevant Medications      pantoprazole (PROTONIX) EC tablet      sucralfate (CARAFATE) 1 G tablet    Other Visit Diagnoses   Gastritis    -  Primary       Plan:  Meds ordered this encounter  Medications  . pantoprazole (PROTONIX) 40 MG tablet    Sig: Take 1 tablet (40 mg total) by mouth daily.    Dispense:  90 tablet    Refill:  1    Order Specific Question:  Supervising Provider    Answer:  Mikey Kirschner [[id number]]  . citalopram (CELEXA) 40 MG tablet    Sig: TAKE 1 TABLET BY MOUTH EVERY DAY    Dispense:  90 tablet    Refill:  0    Order Specific Question:  Supervising Provider    Answer:  Mikey Kirschner [[id number]]  . sucralfate (CARAFATE) 1 G tablet    Sig: Take 1 tablet (1 g total) by mouth 4 (four) times daily -  with meals and at bedtime. Prn reflux    Dispense:  40 tablet    Refill:  0    Order Specific Question:  Supervising Provider    Answer:  Mikey Kirschner [[id number]]  . celecoxib (CELEBREX) 200 MG capsule    Sig:    Celebrex has worked better than any medicine they have tried for her  severe myalgias. Start Protonix as directed. Add Carafate as directed when necessary. If abdominal pain or reflux continue over the next 2 weeks, to check with her specialist to reconsider use of Celebrex. Warning signs reviewed. Call back here sooner if any problems.

## 2014-03-01 ENCOUNTER — Ambulatory Visit (HOSPITAL_BASED_OUTPATIENT_CLINIC_OR_DEPARTMENT_OTHER)
Admission: RE | Admit: 2014-03-01 | Discharge: 2014-03-01 | Disposition: A | Discharge status: Home / Self Care | Payer: BC Managed Care – PPO | Source: Ambulatory Visit | Attending: Cardiology | Admitting: Cardiology

## 2014-03-01 ENCOUNTER — Ambulatory Visit (HOSPITAL_COMMUNITY)
Admission: RE | Admit: 2014-03-01 | Discharge: 2014-03-01 | Disposition: A | Discharge status: Home / Self Care | Payer: BC Managed Care – PPO | Source: Ambulatory Visit | Attending: Family Medicine | Admitting: Family Medicine

## 2014-03-01 ENCOUNTER — Other Ambulatory Visit: Payer: Self-pay | Admitting: *Deleted

## 2014-03-01 ENCOUNTER — Encounter (HOSPITAL_COMMUNITY): Payer: Self-pay

## 2014-03-01 VITALS — BP 100/58 | HR 60 | Wt 154.8 lb

## 2014-03-01 DIAGNOSIS — R002 Palpitations: Secondary | ICD-10-CM | POA: Insufficient documentation

## 2014-03-01 DIAGNOSIS — C50419 Malignant neoplasm of upper-outer quadrant of unspecified female breast: Secondary | ICD-10-CM | POA: Insufficient documentation

## 2014-03-01 DIAGNOSIS — I10 Essential (primary) hypertension: Secondary | ICD-10-CM | POA: Diagnosis not present

## 2014-03-01 DIAGNOSIS — C50412 Malignant neoplasm of upper-outer quadrant of left female breast: Secondary | ICD-10-CM

## 2014-03-01 DIAGNOSIS — I369 Nonrheumatic tricuspid valve disorder, unspecified: Secondary | ICD-10-CM

## 2014-03-01 DIAGNOSIS — I079 Rheumatic tricuspid valve disease, unspecified: Secondary | ICD-10-CM | POA: Insufficient documentation

## 2014-03-01 DIAGNOSIS — C50411 Malignant neoplasm of upper-outer quadrant of right female breast: Secondary | ICD-10-CM

## 2014-03-01 NOTE — Addendum Note (Signed)
Encounter addended by: Scarlette Calico, RN on: 03/01/2014 10:04 AM<BR>     Documentation filed: Orders

## 2014-03-01 NOTE — Addendum Note (Signed)
Encounter addended by: Scarlette Calico, RN on: 03/01/2014  9:55 AM<BR>     Documentation filed: Patient Instructions Section

## 2014-03-01 NOTE — Patient Instructions (Signed)
Your physician recommends that you schedule a follow-up appointment in: 3 months with echocardiogram  

## 2014-03-01 NOTE — Progress Notes (Signed)
Patient ID: [name] [name], female   DOB: [birth year], 53 y.o.   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   Started on trastuzumab/ pertuzumab/ carboplatin/docetaxel on 05/01/13. Pertuzumab stopped due to rash.   She completed neoadjuvant chemotherapy and had left lumpectomy.  She is now on Herceptin every 3 wks until 10/15.   ECHO : 07/26/13 EF 60-65%  Lateral S'  11.9   GLS -21.7% ECHO:  08/15/13 EF 60-65%  Lateral s' 11.8 cm/s GLS -24.0% ECHO: 10/31/13 EF 60%, Lateral S' 11.4 cm/sec, GLS -33.2%, normal diastolic function ECHO: 9/51/88 EF 60% lateral s' 12.9 cm/s GLS -22.8%  Follow-up: Doing well occasional SOB - unchanged. No swelling or CP. Palpitations ok on atenolol. These are brief. 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 Lives with her husband and daughter. Former smoker quit 15 years ago  FH: Mother: cerebral aneurysm, HTN, Hyperlipidemia, CVA,       Father:  Deceased MI at age 23 and died. CAD, HTN, Hyper lipidemia,        Brother: Lung Cancer   PHYSICAL EXAM: Filed Vitals:   03/01/14 0912  BP: 100/58  Pulse: 60   General:  Well appearing. No respiratory difficulty HEENT: normal Neck: supple. no JVD. Carotids 2+ bilat; no bruits. No lymphadenopathy or thryomegaly appreciated. Cor: PMI nondisplaced. Regular rate & rhythm. Soft SEM at RSB Lungs: clear Abdomen: soft, nontender, nondistended.  No hepatosplenomegaly. No bruits or masses. Good bowel sounds. Extremities: no cyanosis, clubbing, rash, edema Neuro: alert & oriented x 3, cranial nerves grossly intact. moves all 4 extremities w/o difficulty. Affect pleasant. Skin : Rash noted on trunk    No results found for this or any previous visit (from the past 24 hour(s)). No results found.   ASSESSMENT & PLAN:  1. L Breast Cancer: I reviewed echos personally. EF and Doppler parameters stable. No HF on exam. Continue Herceptin. Has 3 more treatments. Will do f/u echo in November at end of treatment.   2. Palpitations: quiescent on atenolol  [name] [name] 03/01/2014

## 2014-03-01 NOTE — Progress Notes (Signed)
  Echocardiogram 2D Echocardiogram has been performed.  [name] [name] M 03/01/2014, 8:54 AM

## 2014-03-02 ENCOUNTER — Ambulatory Visit: Payer: BC Managed Care – PPO

## 2014-03-02 ENCOUNTER — Ambulatory Visit (HOSPITAL_BASED_OUTPATIENT_CLINIC_OR_DEPARTMENT_OTHER): Payer: BC Managed Care – PPO

## 2014-03-02 ENCOUNTER — Other Ambulatory Visit: Payer: Self-pay | Admitting: Oncology

## 2014-03-02 ENCOUNTER — Other Ambulatory Visit (HOSPITAL_BASED_OUTPATIENT_CLINIC_OR_DEPARTMENT_OTHER): Payer: BC Managed Care – PPO

## 2014-03-02 VITALS — BP 111/56 | HR 51 | Temp 98.4°F

## 2014-03-02 DIAGNOSIS — C50412 Malignant neoplasm of upper-outer quadrant of left female breast: Secondary | ICD-10-CM

## 2014-03-02 DIAGNOSIS — Z5112 Encounter for antineoplastic immunotherapy: Secondary | ICD-10-CM

## 2014-03-02 DIAGNOSIS — C50419 Malignant neoplasm of upper-outer quadrant of unspecified female breast: Secondary | ICD-10-CM

## 2014-03-02 DIAGNOSIS — C773 Secondary and unspecified malignant neoplasm of axilla and upper limb lymph nodes: Secondary | ICD-10-CM

## 2014-03-02 LAB — COMPREHENSIVE METABOLIC PANEL (CC13)
ALT: 20 U/L (ref 0–55)
AST: 14 U/L (ref 5–34)
Albumin: 3.7 g/dL (ref 3.5–5.0)
Alkaline Phosphatase: 51 U/L (ref 40–150)
Anion Gap: 9 mEq/L (ref 3–11)
BUN: 15.4 mg/dL (ref 7.0–26.0)
CO2: 29 mEq/L (ref 22–29)
Calcium: 9.5 mg/dL (ref 8.4–10.4)
Chloride: 107 mEq/L (ref 98–109)
Creatinine: 1 mg/dL (ref 0.6–1.1)
Glucose: 93 mg/dl (ref 70–140)
Potassium: 4.2 mEq/L (ref 3.5–5.1)
Sodium: 144 mEq/L (ref 136–145)
Total Bilirubin: 0.59 mg/dL (ref 0.20–1.20)
Total Protein: 6.9 g/dL (ref 6.4–8.3)

## 2014-03-02 LAB — CBC WITH DIFFERENTIAL/PLATELET
BASO%: 0.2 % (ref 0.0–2.0)
Basophils Absolute: 0 10*3/uL (ref 0.0–0.1)
EOS%: 0.3 % (ref 0.0–7.0)
Eosinophils Absolute: 0 10*3/uL (ref 0.0–0.5)
HCT: 34.2 % — ABNORMAL LOW (ref 34.8–46.6)
HGB: 11.2 g/dL — ABNORMAL LOW (ref 11.6–15.9)
LYMPH%: 12.4 % — ABNORMAL LOW (ref 14.0–49.7)
MCH: 29.3 pg (ref 25.1–34.0)
MCHC: 32.7 g/dL (ref 31.5–36.0)
MCV: 89.7 fL (ref 79.5–101.0)
MONO#: 0.8 10*3/uL (ref 0.1–0.9)
MONO%: 7.6 % (ref 0.0–14.0)
NEUT#: 7.9 10*3/uL — ABNORMAL HIGH (ref 1.5–6.5)
NEUT%: 79.5 % — ABNORMAL HIGH (ref 38.4–76.8)
Platelets: 163 10*3/uL (ref 145–400)
RBC: 3.81 10*6/uL (ref 3.70–5.45)
RDW: 14.9 % — ABNORMAL HIGH (ref 11.2–14.5)
WBC: 9.9 10*3/uL (ref 3.9–10.3)
lymph#: 1.2 10*3/uL (ref 0.9–3.3)

## 2014-03-02 MED ORDER — SODIUM CHLORIDE 0.9 % IJ SOLN
10.0000 mL | INTRAMUSCULAR | Status: DC | PRN
  Administered 2014-03-02: 10 mL
  Filled 2014-03-02: qty 10

## 2014-03-02 MED ORDER — ACETAMINOPHEN 325 MG PO TABS
650.0000 mg | ORAL_TABLET | Freq: Once | ORAL | Status: AC
  Administered 2014-03-02: 650 mg via ORAL

## 2014-03-02 MED ORDER — DIPHENHYDRAMINE HCL 25 MG PO CAPS
ORAL_CAPSULE | ORAL | Status: AC
  Filled 2014-03-02: qty 1

## 2014-03-02 MED ORDER — DIPHENHYDRAMINE HCL 25 MG PO CAPS
25.0000 mg | ORAL_CAPSULE | Freq: Once | ORAL | Status: AC
  Administered 2014-03-02: 25 mg via ORAL

## 2014-03-02 MED ORDER — HEPARIN SOD (PORK) LOCK FLUSH 100 UNIT/ML IV SOLN
500.0000 [IU] | Freq: Once | INTRAVENOUS | Status: AC | PRN
  Administered 2014-03-02: 500 [IU]
  Filled 2014-03-02: qty 5

## 2014-03-02 MED ORDER — SODIUM CHLORIDE 0.9 % IV SOLN
Freq: Once | INTRAVENOUS | Status: AC
  Administered 2014-03-02: 10:00:00 via INTRAVENOUS

## 2014-03-02 MED ORDER — TRASTUZUMAB CHEMO INJECTION 440 MG
6.0000 mg/kg | Freq: Once | INTRAVENOUS | Status: AC
  Administered 2014-03-02: 420 mg via INTRAVENOUS
  Filled 2014-03-02: qty 20

## 2014-03-02 MED ORDER — ACETAMINOPHEN 325 MG PO TABS
ORAL_TABLET | ORAL | Status: AC
  Filled 2014-03-02: qty 2

## 2014-03-02 NOTE — Patient Instructions (Signed)
[facility] Discharge Instructions for Patients Receiving Chemotherapy  Today you received the following chemotherapy agents herceptin   To help prevent nausea and vomiting after your treatment, we encourage you to take your nausea medication as directed   If you develop nausea and vomiting that is not controlled by your nausea medication, call the clinic.   BELOW ARE SYMPTOMS THAT SHOULD BE REPORTED IMMEDIATELY:  *FEVER GREATER THAN 100.5 F  *CHILLS WITH OR WITHOUT FEVER  NAUSEA AND VOMITING THAT IS NOT CONTROLLED WITH YOUR NAUSEA MEDICATION  *UNUSUAL SHORTNESS OF BREATH  *UNUSUAL BRUISING OR BLEEDING  TENDERNESS IN MOUTH AND THROAT WITH OR WITHOUT PRESENCE OF ULCERS  *URINARY PROBLEMS  *BOWEL PROBLEMS  UNUSUAL RASH Items with * indicate a potential emergency and should be followed up as soon as possible.  Feel free to call the clinic you have any questions or concerns. The clinic phone number is [phone].  

## 2014-03-05 ENCOUNTER — Encounter: Payer: Self-pay | Admitting: Oncology

## 2014-03-05 ENCOUNTER — Ambulatory Visit (HOSPITAL_COMMUNITY)
Admission: RE | Admit: 2014-03-05 | Discharge: 2014-03-05 | Disposition: A | Discharge status: Home / Self Care | Payer: BC Managed Care – PPO | Source: Ambulatory Visit | Attending: Family Medicine | Admitting: Family Medicine

## 2014-03-05 DIAGNOSIS — IMO0001 Reserved for inherently not codable concepts without codable children: Secondary | ICD-10-CM | POA: Diagnosis not present

## 2014-03-05 NOTE — Progress Notes (Signed)
Physical Therapy Treatment Patient Details  Name: [name] [name] MRN: [id number] Date of Birth: 08-23-1960  Today's Date: 03/05/2014 Time: 0998-3382 PT Time Calculation (min): 45 min Manual:  [phone] Visit#: 3 of 4  Re-eval: 03/30/14   Subjective: Symptoms/Limitations Symptoms: Pt states that her arm does not feel as heavy     Exercise/Treatments    Manual Therapy Other Manual Therapy: Pt recieved manual decongestive techniques to include supraclavicular, deep and superficial abdominal followed by routing fluid using intraaxillary anastomsis as well as Lt axillary-inguinal anastomis; ending with Lt UE.  Decongestive techniques were completed both anteriorly and posteriorly.   Physical Therapy Assessment and Plan PT Assessment and Plan Clinical Impression Statement: Pt with decreased congestion this session.  Will measure next treatment for size decrease.  Pt may be ready to recieve her sleeve and be done with therapy Thursday.   PT Plan: measure Lt UE next treatment.  Pt originally seen for evaluatin at [facility] street therefore every 4 cm was not taken.        Problem List Patient Active Problem List   Diagnosis Date Noted  . Joint pain 10/02/2013  . Neuropathic pain 10/02/2013  . Onychomycosis of toenail 10/02/2013  . Frequent headaches 06/19/2013  . Anxiety as acute reaction to exceptional stress 05/22/2013  . Breast cancer of upper-outer quadrant of left female breast 04/06/2013  . 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 Obesity (BMI 30-39.9) 12/14/2012  . GERD (gastroesophageal reflux disease) 12/14/2012  . Chronic depression 12/14/2012  . Heart murmur, systolic 67/34/1937  . Essential hypertension 09/02/2012  . Chest pain   . Palpitations   . DEGENERATIVE JOINT DISEASE, RIGHT KNEE 10/03/2009  . [place] DEGENERATION 10/24/2007       GP    Erminia Mcnew,CINDY 03/05/2014, 3:13 PM

## 2014-03-05 NOTE — Progress Notes (Signed)
Contacted pt because her copay assistance for Perjeta and Herceptin will expire on 05/01/14.  She sd she is no longer on Perjeta but still gets Herceptin.  She request that I call the company and extend the assistance for Herceptin.  I will call [employer] and reapply for Herceptin.

## 2014-03-07 ENCOUNTER — Ambulatory Visit (HOSPITAL_COMMUNITY)
Admission: RE | Admit: 2014-03-07 | Discharge: 2014-03-07 | Disposition: A | Discharge status: Home / Self Care | Payer: BC Managed Care – PPO | Source: Ambulatory Visit | Attending: Family Medicine | Admitting: Family Medicine

## 2014-03-07 DIAGNOSIS — IMO0001 Reserved for inherently not codable concepts without codable children: Secondary | ICD-10-CM | POA: Insufficient documentation

## 2014-03-07 DIAGNOSIS — I972 Postmastectomy lymphedema syndrome: Secondary | ICD-10-CM | POA: Insufficient documentation

## 2014-03-07 NOTE — Evaluation (Signed)
Physical Therapy Discharge Patient Details  Name: [name] [name] MRN: [id number] Date of Birth: May 21, 1961  Today's Date: 03/07/2014 Time: 0852-0930 PT Time Calculation (min): 38 min Charge:   Manual [phone]             Visit#: 4 of 4  Re-eval: 03/30/14  Past Medical History:  Past Medical History  Diagnosis Date  . Chest pain 2009    Consultation-Rothbart, negative chest CT; nl echo in 2005; h/o palpitations  . Palpitations   . Degenerative joint disease     + degenerative joint disease of the lumbosacral spine  . Colitis 2010    not IBD  . Herpes simplex type II infection   . Allergic rhinitis   . Hypercholesteremia     "slightly high"  . Anxiety   . Heart murmur     "small"  . GERD (gastroesophageal reflux disease)     "a little"  . Breast cancer 03/31/13    left  . Cancer   . Depression   . Hypertension   . Anemia, unspecified 05/30/2013  . Wears glasses    Past Surgical History:  Past Surgical History  Procedure Laterality Date  . Colonoscopy  10/2010    proctitis; melanosis coli  . Tubal ligation    . Breast excisional biopsy  04/2003    Left; benign disease  . Right oophorectomy  03/13/2007  . Abdominal hysterectomy  03/13/2007    TAH ?BSO--Dr Levin Bacon, [place]  . Eye surgery Left     "fix lazy eye"  . Portacath placement Right 04/21/2013    Procedure: INSERTION PORT-A-CATH;  Surgeon: [name], MD;  Location: [facility];  Service: General;  Laterality: Right;  . Breast lumpectomy with needle localization and axillary lymph node dissection Left 09/12/2013    Procedure: BREAST LUMPECTOMY WITH NEEDLE LOCALIZATION AND AXILLARY LYMPH NODE DISSECTION;  Surgeon: [name], MD;  Location: [place];  Service: General;  Laterality: Left;  and axilla  . Breast surgery      Subjective Symptoms/Limitations Symptoms: Pt states her arm is feeling normal and under her arm is feeling better.  Pt states that she feels she is ready for discharge.    Pain Assessment Currently in Pain?: No/denies    Measurement is in pt file.  No difference between Rt and Lt UE>         Manual Therapy Other Manual Therapy: Pt recieved manual decongestive techniques to include supraclavicular, deep and superficial abdominal followed by routing fluid using intraaxillary anastomsis as well as Lt axillary-inguinal anastomis; ending with Lt UE.  Decongestive techniques were completed both anteriorly and posteriorly.   Physical Therapy Assessment and Plan PT Assessment and Plan Clinical Impression Statement: Pt with continued decongestion.  Pt has been completing self massaging at home.  Pt has not lost fluid but when her UE was measured by evaluating therapist on 8/18 the UE was actually smaller than her Rt UE.  Report is more subjective and subjectively pt states that she is not feeling the fullness any longer.  PT Plan: Discharge pt.     Goals PT Long Term Goals PT Long Term Goal 1: completed by initial therapist on 02/20/2014-verbalize good understanding of the maintenace phase of treatment including manula lymph drainage, use of compression and lymphedema risk reduction practices.  PT Long Term Goal 1 - Progress: Met (per measurement no decrease of swelling but measurement did not show swelling on eval ; more pt report of fullness. ) PT  Long Term Goal 2: reduce edema by 50% per pt report of "fullness" PT Long Term Goal 2 - Progress: Met Long Term Goal 3: I with HEP  Long Term Goal 3 Progress: Met  Problem List Patient Active Problem List   Diagnosis Date Noted  . Joint pain 10/02/2013  . Neuropathic pain 10/02/2013  . Onychomycosis of toenail 10/02/2013  . Frequent headaches 06/19/2013  . Anxiety as acute reaction to exceptional stress 05/22/2013  . Breast cancer of upper-outer quadrant of left female breast 04/06/2013  . 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 Obesity (BMI 30-39.9) 12/14/2012  . GERD (gastroesophageal reflux disease)  12/14/2012  . Chronic depression 12/14/2012  . Heart murmur, systolic 30/17/2091  . Essential hypertension 09/02/2012  . Chest pain   . Palpitations   . DEGENERATIVE JOINT DISEASE, RIGHT KNEE 10/03/2009  . [place] DEGENERATION 10/24/2007       GP    RUSSELL,CINDY 03/07/2014, 10:15 AM  Physician Documentation Your signature is required to indicate approval of the treatment plan as stated above.  Please sign and either send electronically or make a copy of this report for your files and return this physician signed original.   Please mark one 1.__approve of plan  2. ___approve of plan with the following conditions.   ______________________________                                                          _____________________ Physician Signature                                                                                                             Date

## 2014-03-09 ENCOUNTER — Emergency Department (HOSPITAL_COMMUNITY)
Admission: EM | Admit: 2014-03-09 | Discharge: 2014-03-09 | Disposition: A | Discharge status: Home / Self Care | Payer: BC Managed Care – PPO | Attending: Emergency Medicine | Admitting: Emergency Medicine

## 2014-03-09 ENCOUNTER — Emergency Department (HOSPITAL_COMMUNITY): Payer: BC Managed Care – PPO

## 2014-03-09 ENCOUNTER — Encounter (HOSPITAL_COMMUNITY): Payer: Self-pay | Admitting: Emergency Medicine

## 2014-03-09 ENCOUNTER — Telehealth: Payer: Self-pay | Admitting: *Deleted

## 2014-03-09 DIAGNOSIS — Z853 Personal history of malignant neoplasm of breast: Secondary | ICD-10-CM | POA: Insufficient documentation

## 2014-03-09 DIAGNOSIS — Z8739 Personal history of other diseases of the musculoskeletal system and connective tissue: Secondary | ICD-10-CM | POA: Insufficient documentation

## 2014-03-09 DIAGNOSIS — R599 Enlarged lymph nodes, unspecified: Secondary | ICD-10-CM | POA: Diagnosis present

## 2014-03-09 DIAGNOSIS — I10 Essential (primary) hypertension: Secondary | ICD-10-CM | POA: Insufficient documentation

## 2014-03-09 DIAGNOSIS — Z79899 Other long term (current) drug therapy: Secondary | ICD-10-CM | POA: Insufficient documentation

## 2014-03-09 DIAGNOSIS — Z87891 Personal history of nicotine dependence: Secondary | ICD-10-CM | POA: Diagnosis not present

## 2014-03-09 DIAGNOSIS — R222 Localized swelling, mass and lump, trunk: Secondary | ICD-10-CM | POA: Insufficient documentation

## 2014-03-09 DIAGNOSIS — Z8639 Personal history of other endocrine, nutritional and metabolic disease: Secondary | ICD-10-CM | POA: Insufficient documentation

## 2014-03-09 DIAGNOSIS — Z8619 Personal history of other infectious and parasitic diseases: Secondary | ICD-10-CM | POA: Diagnosis not present

## 2014-03-09 DIAGNOSIS — K219 Gastro-esophageal reflux disease without esophagitis: Secondary | ICD-10-CM | POA: Insufficient documentation

## 2014-03-09 DIAGNOSIS — F3289 Other specified depressive episodes: Secondary | ICD-10-CM | POA: Insufficient documentation

## 2014-03-09 DIAGNOSIS — R011 Cardiac murmur, unspecified: Secondary | ICD-10-CM | POA: Diagnosis not present

## 2014-03-09 DIAGNOSIS — F329 Major depressive disorder, single episode, unspecified: Secondary | ICD-10-CM | POA: Insufficient documentation

## 2014-03-09 DIAGNOSIS — Z862 Personal history of diseases of the blood and blood-forming organs and certain disorders involving the immune mechanism: Secondary | ICD-10-CM | POA: Insufficient documentation

## 2014-03-09 DIAGNOSIS — F411 Generalized anxiety disorder: Secondary | ICD-10-CM | POA: Diagnosis not present

## 2014-03-09 DIAGNOSIS — I498 Other specified cardiac arrhythmias: Secondary | ICD-10-CM | POA: Insufficient documentation

## 2014-03-09 NOTE — Telephone Encounter (Signed)
Message left on VM at 1355 by Candy nurse at [facility] stating pt is with her with " noted enlarged lymph node in her left neck and her arm is sore "  Per Azyiah area " has been this way for about a week "  Message retrieved from VM at 1600- and pt contacted - she states arm is not swollen or warm to touch- area of discomfort is in " under the arm " " and it is a dull pain "  Lymph node is on cervical bone and " is hard ".  " doesn't hurt "  Per discussion this RN informed pt due to lateness of day she should proceed to the ER - Harlow stated reluctance due to " but it doesn't hurt "  Per review with Selena Lesser NP- recommendation is for pt to be assessed due to concern if cellulitis vs blood clot- Cyndee could see the patient if she could get here promptly-   Per return call to pt- discussed above. Aleza lives in [place] and per discussion will proceed to the ER at [facility].  This RN called report to Event organiser in ER.

## 2014-03-09 NOTE — ED Provider Notes (Signed)
CSN: [id number]     Arrival date & time 03/09/14  1743 History   First MD Initiated Contact with Patient 03/09/14 1825     Chief Complaint  Patient presents with  . Adenopathy     (Consider location/radiation/quality/duration/timing/severity/associated sxs/prior Treatment) HPI Comments: [name] [name] is a 53 year old with history of L breast cancer (T2-T3 pN1, stage IIB-IIIA invasive ductal carcinoma) status post radiation, resection, currently undergoing chemotherapy, HTN, Depression, herpes simplex, DJD, palpitations, presenting to emergency room chief complaint of left clavicle lymphadenopathy for 3 days. The patient reports incidental finding of swelling with palpation, nontender. No left upper extremity edema. No skin changes. Denies fever, shortness of breath, chest pain. Patient reports last chemotherapy 8/17. She reports she was told to come to the ED by oncology nurse over the phone because oncologist is out of town and further evaluation.  The history is provided by the patient. No language interpreter was used.    Past Medical History  Diagnosis Date  . Chest pain 2009    Consultation-Rothbart, negative chest CT; nl echo in 2005; h/o palpitations  . Palpitations   . Degenerative joint disease     + degenerative joint disease of the lumbosacral spine  . Colitis 2010    not IBD  . Herpes simplex type II infection   . Allergic rhinitis   . Hypercholesteremia     "slightly high"  . Anxiety   . Heart murmur     "small"  . GERD (gastroesophageal reflux disease)     "a little"  . Breast cancer 03/31/13    left  . Cancer   . Depression   . Hypertension   . Anemia, unspecified 05/30/2013  . Wears glasses    Past Surgical History  Procedure Laterality Date  . Colonoscopy  10/2010    proctitis; melanosis coli  . Tubal ligation    . Breast excisional biopsy  04/2003    Left; benign disease  . Right oophorectomy  03/13/2007  . Abdominal hysterectomy  03/13/2007    TAH ?BSO--Dr  Levin Bacon, [place]  . Eye surgery Left     "fix lazy eye"  . Portacath placement Right 04/21/2013    Procedure: INSERTION PORT-A-CATH;  Surgeon: [name], MD;  Location: [facility];  Service: General;  Laterality: Right;  . Breast lumpectomy with needle localization and axillary lymph node dissection Left 09/12/2013    Procedure: BREAST LUMPECTOMY WITH NEEDLE LOCALIZATION AND AXILLARY LYMPH NODE DISSECTION;  Surgeon: [name], MD;  Location: [place];  Service: General;  Laterality: Left;  and axilla  . Breast surgery     Family History  Problem Relation Age of Onset  . Aneurysm Mother     Cerebral  . Hypertension Mother   . Hyperlipidemia Mother   . Stroke Mother   . Coronary artery disease Father   . Hypertension Father   . Hyperlipidemia Father   . Lung cancer Brother   . Cancer Brother     Lung   History  Substance Use Topics  . Smoking status: Former Smoker -- 0.50 packs/day for 15 years    Types: Cigarettes    Quit date: 07/06/1982  . Smokeless tobacco: Never Used  . Alcohol Use: No   OB History   Grav Para Term Preterm Abortions TAB SAB Ect Mult Living   4 4 4       4      Review of Systems  Constitutional: Negative for fever and chills.  Respiratory: Negative for cough, chest tightness and shortness of breath.   Cardiovascular: Negative for chest pain, palpitations and leg swelling.  Skin: Negative for color change and wound.      Allergies  Doxycycline  Home Medications   Prior to Admission medications   Medication Sig Start Date End Date Taking? Authorizing Provider  ALPRAZolam (XANAX) 1 MG tablet TAKE 1 TABLET THREE TIMES DAILY AS NEEDED FOR ANXIETY    Nilda Simmer, NP  anastrozole (ARIMIDEX) 1 MG tablet Take 1 tablet (1 mg total) by mouth daily. 12/08/13   Chauncey Cruel, MD  atenolol (TENORMIN) 50 MG tablet TAKE 1 TABLET BY MOUTH DAILY 02/05/14   Mikey Kirschner, MD  celecoxib (CELEBREX) 200 MG capsule  02/19/14    Historical Provider, MD  citalopram (CELEXA) 40 MG tablet TAKE 1 TABLET BY MOUTH EVERY DAY 02/23/14   Nilda Simmer, NP  diphenhydramine-acetaminophen (TYLENOL PM) 25-500 MG TABS Take 2 tablets by mouth at bedtime.    Historical Provider, MD  hyaluronate sodium (RADIAPLEXRX) GEL Apply 1 application topically 2 (two) times daily.    Historical Provider, MD  lidocaine-prilocaine (EMLA) cream Apply topically as needed. 04/27/13   Chauncey Cruel, MD  pantoprazole (PROTONIX) 40 MG tablet Take 1 tablet (40 mg total) by mouth daily. 02/23/14   Nilda Simmer, NP  sucralfate (CARAFATE) 1 G tablet Take 1 tablet (1 g total) by mouth 4 (four) times daily -  with meals and at bedtime. Prn reflux 02/23/14   Nilda Simmer, NP  valACYclovir (VALTREX) 1000 MG tablet TAKE 1 TABLET BY MOUTH DAILY    Mikey Kirschner, MD   BP 130/74  Pulse 53  Temp(Src) 99.1 F (37.3 C) (Oral)  Resp 14  SpO2 97%  LMP 12/04/2008 Physical Exam  Nursing note and vitals reviewed. Constitutional: She appears well-developed and well-nourished. No distress.  Cardiovascular: Regular rhythm.  Bradycardia present.   Murmur heard.  Systolic murmur is present  Pulses:      Radial pulses are 2+ on the right side, and 2+ on the left side.  No upper extremity edema.  Pulmonary/Chest: Not tachypneic. No respiratory distress. She has no decreased breath sounds. She has no wheezes. She has no rhonchi. She has no rales.    Minimal welling to the Left sternal end of clavicle. Non-tender, no-overlying erythema, No appreciable small round mass.  Skin: She is not diaphoretic.    ED Course  Procedures (including critical care time) Labs Review Labs Reviewed - No data to display  Imaging Review Dg Chest 2 View  03/09/2014   CLINICAL DATA:  Lymphadenopathy.  EXAM: CHEST - 2 VIEW  COMPARISON:  07/03/2013  FINDINGS: Stable positioning of Port-A-Cath with tip in SVC. There is no evidence of pulmonary edema, consolidation,  pneumothorax, nodule or pleural fluid. The heart size and mediastinal contours are normal. No evidence by chest x-ray of gross hilar or mediastinal lymphadenopathy. The bony thorax is unremarkable.  IMPRESSION: No active disease.   Electronically Signed   By: Aletta Edouard M.D.   On: 03/09/2014 20:41     EKG Interpretation None      MDM   Final diagnoses:  Soft tissue swelling of chest wall  History of breast cancer   No signs of infection, minimal swelling to left, sternal end of the clavicle no nodularity palpated. No sign of DVT, or superficial phlebitis. X-ray negative for acute findings. Plan to followup with oncology for further evaluation of her questionable  lymphadenopathy. Discussed imaging results, and treatment plan with the patient. Return precautions given. Reports understanding and no other concerns at this time.  Patient is stable for discharge at this time.     Harvie Heck, PA-C 03/10/14 0157

## 2014-03-09 NOTE — ED Notes (Addendum)
Pt c/o possible swollen lymph node on L upper chest x "3-4 days ago."  Denies pain.  Hx of breast cancer.  Pt reports last chem 8/17.  Sts she was directed to come in by Magrinat MD.

## 2014-03-09 NOTE — Discharge Instructions (Signed)
Call for a follow up appointment with your oncologist for further evaluation of your left clavicle soft tissue swelling. Return if Symptoms worsen, develop fever, shortness breath, increase in left upper extremity swelling, redness or tenderness to the soft tissue swelling.   Take medication as prescribed.

## 2014-03-09 NOTE — ED Notes (Addendum)
Pt reports swollen lymph node on collar bone that arose 2 days ago.  Pt says this has never happened before.  Pt denies any pain, chills, but does report a mild fever. Pt denies being around anyone who has been sick. Pt reports nausea and generalized weakness.

## 2014-03-10 NOTE — ED Provider Notes (Signed)
Medical screening examination/treatment/procedure(s) were performed by non-physician practitioner and as supervising physician I was immediately available for consultation/collaboration.   EKG Interpretation None        Delice Bison Jonn Chaikin, DO 03/10/14 1530

## 2014-03-11 ENCOUNTER — Other Ambulatory Visit: Payer: Self-pay | Admitting: Family Medicine

## 2014-03-13 ENCOUNTER — Other Ambulatory Visit: Payer: Self-pay | Admitting: Oncology

## 2014-03-13 ENCOUNTER — Ambulatory Visit (HOSPITAL_COMMUNITY): Payer: BC Managed Care – PPO | Admitting: Physical Therapy

## 2014-03-14 ENCOUNTER — Telehealth: Payer: Self-pay | Admitting: Oncology

## 2014-03-14 ENCOUNTER — Ambulatory Visit (HOSPITAL_COMMUNITY): Payer: BC Managed Care – PPO | Admitting: Physical Therapy

## 2014-03-14 NOTE — Telephone Encounter (Signed)
pt called to sched appt...done

## 2014-03-15 ENCOUNTER — Other Ambulatory Visit: Payer: Self-pay | Admitting: Nurse Practitioner

## 2014-03-15 ENCOUNTER — Other Ambulatory Visit: Payer: Self-pay | Admitting: Family Medicine

## 2014-03-15 NOTE — Telephone Encounter (Signed)
Ok 6 mo worth 

## 2014-03-19 ENCOUNTER — Ambulatory Visit (HOSPITAL_COMMUNITY): Payer: BC Managed Care – PPO | Admitting: Physical Therapy

## 2014-03-21 ENCOUNTER — Ambulatory Visit (HOSPITAL_COMMUNITY): Payer: BC Managed Care – PPO | Admitting: Physical Therapy

## 2014-03-22 ENCOUNTER — Telehealth: Payer: Self-pay | Admitting: Oncology

## 2014-03-22 ENCOUNTER — Other Ambulatory Visit: Payer: Self-pay | Admitting: Emergency Medicine

## 2014-03-22 ENCOUNTER — Ambulatory Visit (HOSPITAL_BASED_OUTPATIENT_CLINIC_OR_DEPARTMENT_OTHER): Payer: BC Managed Care – PPO | Admitting: Oncology

## 2014-03-22 VITALS — BP 115/50 | HR 59 | Temp 98.4°F | Resp 18 | Ht 61.0 in | Wt 157.8 lb

## 2014-03-22 DIAGNOSIS — R51 Headache: Secondary | ICD-10-CM

## 2014-03-22 DIAGNOSIS — I10 Essential (primary) hypertension: Secondary | ICD-10-CM

## 2014-03-22 DIAGNOSIS — C50419 Malignant neoplasm of upper-outer quadrant of unspecified female breast: Secondary | ICD-10-CM

## 2014-03-22 DIAGNOSIS — C50412 Malignant neoplasm of upper-outer quadrant of left female breast: Secondary | ICD-10-CM

## 2014-03-22 DIAGNOSIS — R229 Localized swelling, mass and lump, unspecified: Secondary | ICD-10-CM

## 2014-03-22 DIAGNOSIS — IMO0002 Reserved for concepts with insufficient information to code with codable children: Secondary | ICD-10-CM

## 2014-03-22 DIAGNOSIS — M545 Low back pain, unspecified: Secondary | ICD-10-CM

## 2014-03-22 DIAGNOSIS — M1710 Unilateral primary osteoarthritis, unspecified knee: Secondary | ICD-10-CM

## 2014-03-22 DIAGNOSIS — Z170 Estrogen receptor positive status [ER+]: Secondary | ICD-10-CM

## 2014-03-22 DIAGNOSIS — R011 Cardiac murmur, unspecified: Secondary | ICD-10-CM

## 2014-03-22 DIAGNOSIS — Z79811 Long term (current) use of aromatase inhibitors: Secondary | ICD-10-CM

## 2014-03-22 NOTE — Telephone Encounter (Signed)
gv pt appt schedule for oct. central will call w/scan/xray appts - pt aware. pof for 03/22/14 does not specify when appt w/mid level should be. cxd appt for 9/29 w/Gm and scheduled pt for lb/LC w/next tx on 10/9. message to [facility] re this and to clarify if this is not what he intended.

## 2014-03-22 NOTE — Progress Notes (Signed)
ID: [name] [name] OB: March 17, 1961  MR#: [id number]  RFF#:[id number]  PCP: [name] Battiest, MD ; [name] Forster, NP GYN:   [name] Half, MD  SU: [name] Overall, MD [place]:  [name] Silversmith, MD OTHER MD: [name] Busman, MD;  [name] Bonito, MD  CHIEF COMPLAINT:  Left Breast Cancer TREATMENT: Anastrozole, trastuzumab  BREAST CANCER HISTORY: From the original intake note 04/12/2013:  [name] [name] palpated a mass in her left breast in late August 2014. She was already scheduled for screening mammography at [facility] and this was performed 03/13/2013. Indeed a possible mass was noted in the left breast and on 03/29/2013 the patient underwent diagnostic left mammography and left ultrasonography. This showed a 2.5 cm irregular mass in the upper outer quadrant of the left breast. This was palpable.by ultrasound there was a 1.6 cm irregular hypoechoic mass in the area in question, and in addition to enlarged left axillary lymph nodes were noted, the largest measuring 2.5 cm.  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  Bilateral breast MRI 04/11/2013 shows 3 abnormal enhancing masses in the upper outer quadrant of the left breast measuring altogether 6.4 cm. The largest separate mass measured 2.7 cm. There were abnormal or large lymph nodes in the left axilla. Ultrasound guided biopsy of the 2 smaller masses has been scheduled.  The patient's subsequent history is as detailed below   INTERVAL HISTORY: [name] [name] returns today for followup of her left-sided breast cancer. Tomorrow will be her next to last treatment of trastuzumab. She has tolerated those well, with no side  effects, and no decrease in her ejection fraction. Also she was started on anastrozole June 2015. Initially she had some arthralgias and myalgias, but after a few weeks those cleared and she currently is having no side effects from it that she is aware of. She is able to obtain the drug for approximately $12 a month.  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 Her grandchildren however are no longer living with her. His dentist she and her husband at home. She is very concerned about a change in her upper anterior chest, associated with the medial aspect of her left collar bone. This is not tender but it is different than she went to the emergency room where they performed a physical exam and 8 chest x-ray. They suggested that she followup with [place] in consideration of possible lymphadenopathy. In addition she's been having severe headaches. These have been almost daily for the last 2 weeks. They're not associated with nausea or vomiting. She has some blurred vision. She has no gait imbalance or focal weakness. She is anxious but denies depression. She is very fatigued. A detailed review of systems is otherwise stable  PAST MEDICAL HISTORY: Past Medical History  Diagnosis Date  . Chest pain 2009    Consultation-[name] [name], negative chest CT; nl echo in 2005; h/o palpitations  . Palpitations   . Degenerative joint disease     +  degenerative joint disease of the lumbosacral spine  . Colitis 2010    not IBD  . Herpes simplex type II infection   . Allergic rhinitis   . Hypercholesteremia     "slightly high"  . Anxiety   . Heart murmur     "small"  . GERD (gastroesophageal reflux disease)     "a little"  . Breast cancer 03/31/13    left  . Cancer   . Depression   . Hypertension   . Anemia, unspecified 05/30/2013  .  Wears glasses     PAST SURGICAL HISTORY: Past Surgical History  Procedure Laterality Date  . Colonoscopy  10/2010    proctitis; melanosis coli  . Tubal ligation    . Breast excisional biopsy  04/2003    Left; benign disease  . Right oophorectomy  03/13/2007  . Abdominal hysterectomy  03/13/2007    TAH ?BSO--Dr [name] [name], [place]  . Eye surgery Left     "fix lazy eye"  . Portacath placement Right 04/21/2013    Procedure: INSERTION PORT-A-CATH;  Surgeon: [name] Medal, MD;  Location: [facility];  Service: General;  Laterality: Right;  . Breast lumpectomy with needle localization and axillary lymph node dissection Left 09/12/2013    Procedure: BREAST LUMPECTOMY WITH NEEDLE LOCALIZATION AND AXILLARY LYMPH NODE DISSECTION;  Surgeon: [name] Medal, MD;  Location: [facility];  Service: General;  Laterality: Left;  and axilla  . Breast surgery      FAMILY HISTORY Family History  Problem Relation Age of Onset  . Aneurysm Mother     Cerebral  . Hypertension Mother   . Hyperlipidemia Mother   . Stroke Mother   . Coronary artery disease Father   . Hypertension Father   . Hyperlipidemia Father   . Lung cancer Brother   . Cancer Brother     Lung  The patient's father died at the age of 22 from a myocardial infarction. The patient's mother died at the age of 56 from a stroke. The patient had 6 brothers, 2 sisters. One brother has a history of lung cancer in the setting of tobacco abuse. Another brother had multiple myeloma. There is no history of breast or ovarian cancer in the family  GYNECOLOGIC HISTORY: (Updated 09/25/2013) Menarche age 49, first live birth age 62, the patient is [name]. She status post abdominal hysterectomy with bilateral salpingo-oophorectomy. She never took hormone replacement.   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 Her husband [name] [name] works in Charity fundraiser. Daughter [name] [name]  formerly lived at home with the patient with her 3 children, all under the age of 52; they have subsequently moved out. She is not employed. Son [name] [name] lives in [place] and is not employed. Daughter Maggie Schwalbe Dillard lives in [place] and is a Barrister's clerk. Daughter Ernst Spell is a Education officer, museum in [place]. The patient has 10 grandchildren. She attends [employer]     ADVANCED DIRECTIVES: Not in place   HEALTH MAINTENANCE:  (Updated 09/25/2013)  History  Substance Use Topics  . Smoking status: Former Smoker -- 0.50 packs/day for 15 years    Types: Cigarettes    Quit date: 07/06/1982  . Smokeless tobacco: Never Used  . Alcohol Use: No     Colonoscopy:2012  PAP:2014   Bone density: Never  Lipid panel: Not on file    Allergies  Allergen Reactions  .  Doxycycline Other (See Comments)    Unknown    Current Outpatient Prescriptions  Medication Sig Dispense Refill  . ALPRAZolam (XANAX) 1 MG tablet TAKE 1 TABLET THREE TIMES DAILY AS NEEDED FOR ANXIETY  60 tablet  5  . anastrozole (ARIMIDEX) 1 MG tablet Take 1 tablet (1 mg total) by mouth daily.  90 tablet  4  . atenolol (TENORMIN) 50 MG tablet Take 50 mg by mouth every evening.      . celecoxib (CELEBREX) 200 MG capsule Take 200 mg by mouth every evening.       . citalopram (CELEXA) 40 MG tablet Take 40 mg by mouth every evening.      . diphenhydramine-acetaminophen (TYLENOL PM) 25-500 MG TABS Take 2 tablets by mouth at bedtime.      . hyaluronate sodium (RADIAPLEXRX) GEL Apply 1 application topically 2 (two) times daily.      Marland Kitchen lidocaine-prilocaine (EMLA) cream Apply topically as needed.  30 g  0  . pantoprazole (PROTONIX) 40 MG tablet Take 1 tablet (40 mg total) by mouth daily.  90 tablet  1  . sucralfate (CARAFATE) 1 G tablet Take 1 tablet (1 g total) by mouth 4 (four) times daily -  with meals and at bedtime. Prn reflux  40 tablet  0  . valACYclovir (VALTREX) 1000 MG tablet TAKE 1  TABLET BY MOUTH DAILY  30 tablet  5  . valACYclovir (VALTREX) 1000 MG tablet TAKE 1 TABLET BY MOUTH DAILY  30 tablet  5  . [DISCONTINUED] potassium chloride SA (K-DUR,KLOR-CON) 20 MEQ tablet Take 1 tablet (20 mEq total) by mouth 2 (two) times daily.  20 tablet  1   No current facility-administered medications for this visit.    OBJECTIVE: Middle-aged [place] American woman who appears stated age 53 Vitals:   03/22/14 0941  BP: 115/50  Pulse: 59  Temp: 98.4 F (36.9 C)  Resp: 18  Body mass index is 29.83 kg/(m^2).  ECOG: 1 Filed Weights   03/22/14 0941  Weight: 157 lb 12.8 oz (71.578 kg)   Sclerae unicteric, pupils round and reactive, EOMs intact Oropharynx clear and moist No cervical or supraclavicular adenopathy. In the medial aspect of her left clavicle, just below the clavicular head, and there is a bit of swelling. It is not well demarcated. It is not tender or erythematous. Lungs no rales or rhonchi Heart regular rate and rhythm Abd soft, nontender, positive bowel sounds MSK no focal spinal tenderness, no upper extremity lymphedema Neuro: nonfocal, well oriented, appropriate affect Breasts: The right breast is unremarkable. The left breast is status post lumpectomy and radiation. There is no evidence of local recurrence in the breast or axilla.   LAB RESULTS:   Lab Results  Component Value Date   WBC 9.9 03/02/2014   NEUTROABS 7.9* 03/02/2014   HGB 11.2* 03/02/2014   HCT 34.2* 03/02/2014   MCV 89.7 03/02/2014   PLT 163 03/02/2014      Chemistry      Component Value Date/Time   NA 144 03/02/2014 0841   NA 139 06/06/2008 0545   K 4.2 03/02/2014 0841   K 3.8 06/06/2008 0545   CL 104 06/06/2008 0545   CO2 29 03/02/2014 0841   CO2 26 06/06/2008 0545   BUN 15.4 03/02/2014 0841   BUN 6 06/06/2008 0545   CREATININE 1.0 03/02/2014 0841   CREATININE 0.77 06/06/2008 0545      Component Value Date/Time   CALCIUM 9.5 03/02/2014 0841   CALCIUM 8.7  06/06/2008 0545   ALKPHOS 51  03/02/2014 0841   ALKPHOS 62 04/12/2008 1725   AST 14 03/02/2014 0841   AST 21 04/12/2008 1725   ALT 20 03/02/2014 0841   ALT 28 04/12/2008 1725   BILITOT 0.59 03/02/2014 0841   BILITOT 0.5 04/12/2008 1725         STUDIES: Transthoracic Echocardiography  Patient: [name] [name], [name] [name] MR #: [id number] Study Date: 03/01/2014 Gender: F Age: 77 Height: 154.9 cm Weight: 69.9 kg BSA: 1.76 m^2 Pt. Status: Room:  ATTENDING Luking, W S ORDERING Berry, Amy G PERFORMING [facility], Outpatient SONOGRAPHER Darlina Sicilian, RDCS  cc:  ------------------------------------------------------------------- LV EF: 60% - 65%  -----------------------------  ASSESSMENT/PLAN: 53 y.o. [place], [place] woman   (1)  status post left breast biopsy 03/29/2013 for a clinical T2-T3 pN1, stage IIB-IIIA invasive ductal carcinoma, grade 2-3, estrogen receptor 39% positive (with moderate staining intensity), progesterone receptor negative, with an MIB-1 of 83%, and HER-2 amplified by CISH, with a ratio of 7.5 and 15 HER-2 copies per cell  (2) neoadjuvant chemotherapy, with trastuzumab/ pertuzumab/ carboplatin/ docetaxel repeated every 3 weeks x6, completed 08/21/2013. Pertuzumab  held after 4 cycles secondary to diarrhea. The patient received Ne[facility] on [facility] per her request.   (3) Trastuzumab to be continued for total of one year (to mid October 2015). Most recent echo 03/01/2014 showed a well preserved ejection fraction.  (4)  status post left lumpectomy and axillary node dissection 09/12/2013, with a complete pathologic response (a total of 16 lymph nodes were examined,1 sentinel and 15 non-sentinel),  YpT0, ypN0.  (5) adjuvant radiation at [facility] completed 12/08/2013  (6) to start anastrozole 01/16/2014  (7) status post remote TAH/ BSO  PLAN: [name] [name] is very concerned about the change in her upper chest, described above. I don't know a good way to evaluate that other than doing a CT scan  of the chest so we are doing that.  I am more concerned about her persistent headaches. Of course these could just be due to her needing new glasses sort of sinus problems, but when they are so persistent I really do think we need to make sure she does not have brain metastases so I am setting her up for a brain MRI.  As far as her low back pain is concerned, I am fairly sure this is going to be degenerative disease so I am just obtaining plain films of her lumbar spine.  She will receive Herceptin September 18 and then the second week in October. That will be her final Herceptin dose. After that she can have her port removed.  It is very favorable that she is tolerating the anastrozole with no significant side effects. The plan will be to continue that at minimum of 5 years.  She has a good understanding of the [name] plan. She agrees with it. She knows the goal of treatment in her case is cure. She will call with any problems that may develop before her next visit here.  Chauncey Cruel, MD  Medical Oncology   03/22/2014 10:19 AM

## 2014-03-23 ENCOUNTER — Other Ambulatory Visit: Payer: Self-pay | Admitting: Hematology and Oncology

## 2014-03-23 ENCOUNTER — Ambulatory Visit (HOSPITAL_COMMUNITY): Payer: BC Managed Care – PPO | Admitting: Physical Therapy

## 2014-03-23 ENCOUNTER — Ambulatory Visit (HOSPITAL_BASED_OUTPATIENT_CLINIC_OR_DEPARTMENT_OTHER): Payer: BC Managed Care – PPO

## 2014-03-23 ENCOUNTER — Other Ambulatory Visit (HOSPITAL_BASED_OUTPATIENT_CLINIC_OR_DEPARTMENT_OTHER): Payer: BC Managed Care – PPO

## 2014-03-23 ENCOUNTER — Ambulatory Visit: Payer: BC Managed Care – PPO

## 2014-03-23 VITALS — BP 117/61 | HR 57 | Temp 98.1°F | Resp 18

## 2014-03-23 DIAGNOSIS — Z5112 Encounter for antineoplastic immunotherapy: Secondary | ICD-10-CM

## 2014-03-23 DIAGNOSIS — C50412 Malignant neoplasm of upper-outer quadrant of left female breast: Secondary | ICD-10-CM

## 2014-03-23 DIAGNOSIS — C50419 Malignant neoplasm of upper-outer quadrant of unspecified female breast: Secondary | ICD-10-CM

## 2014-03-23 DIAGNOSIS — C773 Secondary and unspecified malignant neoplasm of axilla and upper limb lymph nodes: Secondary | ICD-10-CM

## 2014-03-23 LAB — CBC WITH DIFFERENTIAL/PLATELET
BASO%: 1.1 % (ref 0.0–2.0)
Basophils Absolute: 0.1 10*3/uL (ref 0.0–0.1)
EOS%: 1.1 % (ref 0.0–7.0)
Eosinophils Absolute: 0.1 10*3/uL (ref 0.0–0.5)
HCT: 36.4 % (ref 34.8–46.6)
HGB: 11.7 g/dL (ref 11.6–15.9)
LYMPH%: 23.1 % (ref 14.0–49.7)
MCH: 29.5 pg (ref 25.1–34.0)
MCHC: 32.1 g/dL (ref 31.5–36.0)
MCV: 92.1 fL (ref 79.5–101.0)
MONO#: 0.4 10*3/uL (ref 0.1–0.9)
MONO%: 7.3 % (ref 0.0–14.0)
NEUT#: 4 10*3/uL (ref 1.5–6.5)
NEUT%: 67.4 % (ref 38.4–76.8)
Platelets: 181 10*3/uL (ref 145–400)
RBC: 3.96 10*6/uL (ref 3.70–5.45)
RDW: 14.8 % — ABNORMAL HIGH (ref 11.2–14.5)
WBC: 5.9 10*3/uL (ref 3.9–10.3)
lymph#: 1.4 10*3/uL (ref 0.9–3.3)

## 2014-03-23 LAB — COMPREHENSIVE METABOLIC PANEL (CC13)
ALT: 18 U/L (ref 0–55)
AST: 13 U/L (ref 5–34)
Albumin: 3.5 g/dL (ref 3.5–5.0)
Alkaline Phosphatase: 54 U/L (ref 40–150)
Anion Gap: 10 mEq/L (ref 3–11)
BUN: 16.6 mg/dL (ref 7.0–26.0)
CO2: 26 mEq/L (ref 22–29)
Calcium: 9.3 mg/dL (ref 8.4–10.4)
Chloride: 108 mEq/L (ref 98–109)
Creatinine: 1 mg/dL (ref 0.6–1.1)
Glucose: 77 mg/dl (ref 70–140)
Potassium: 4.2 mEq/L (ref 3.5–5.1)
Sodium: 144 mEq/L (ref 136–145)
Total Bilirubin: 0.34 mg/dL (ref 0.20–1.20)
Total Protein: 6.9 g/dL (ref 6.4–8.3)

## 2014-03-23 MED ORDER — DIPHENHYDRAMINE HCL 25 MG PO CAPS
ORAL_CAPSULE | ORAL | Status: AC
  Filled 2014-03-23: qty 2

## 2014-03-23 MED ORDER — TRASTUZUMAB CHEMO INJECTION 440 MG
6.0000 mg/kg | Freq: Once | INTRAVENOUS | Status: AC
  Administered 2014-03-23: 420 mg via INTRAVENOUS
  Filled 2014-03-23: qty 20

## 2014-03-23 MED ORDER — HEPARIN SOD (PORK) LOCK FLUSH 100 UNIT/ML IV SOLN
500.0000 [IU] | Freq: Once | INTRAVENOUS | Status: AC | PRN
Start: 2014-03-23 — End: 2014-03-23
  Administered 2014-03-23: 500 [IU]
  Filled 2014-03-23: qty 5

## 2014-03-23 MED ORDER — SODIUM CHLORIDE 0.9 % IJ SOLN
10.0000 mL | INTRAMUSCULAR | Status: DC | PRN
  Administered 2014-03-23: 10 mL
  Filled 2014-03-23: qty 10

## 2014-03-23 MED ORDER — ACETAMINOPHEN 325 MG PO TABS
650.0000 mg | ORAL_TABLET | Freq: Once | ORAL | Status: AC
  Administered 2014-03-23: 650 mg via ORAL

## 2014-03-23 MED ORDER — SODIUM CHLORIDE 0.9 % IV SOLN
Freq: Once | INTRAVENOUS | Status: AC
  Administered 2014-03-23: 09:00:00 via INTRAVENOUS

## 2014-03-23 MED ORDER — DIPHENHYDRAMINE HCL 25 MG PO CAPS
50.0000 mg | ORAL_CAPSULE | Freq: Once | ORAL | Status: AC
  Administered 2014-03-23: 50 mg via ORAL

## 2014-03-23 MED ORDER — ACETAMINOPHEN 325 MG PO TABS
ORAL_TABLET | ORAL | Status: AC
  Filled 2014-03-23: qty 2

## 2014-03-23 NOTE — Patient Instructions (Signed)
[facility] Discharge Instructions for Patients Receiving Chemotherapy  Today you received the following chemotherapy agents Herceptin.  To help prevent nausea and vomiting after your treatment, we encourage you to take your nausea medication as prescribed.   If you develop nausea and vomiting that is not controlled by your nausea medication, call the clinic.   BELOW ARE SYMPTOMS THAT SHOULD BE REPORTED IMMEDIATELY:  *FEVER GREATER THAN 100.5 F  *CHILLS WITH OR WITHOUT FEVER  NAUSEA AND VOMITING THAT IS NOT CONTROLLED WITH YOUR NAUSEA MEDICATION  *UNUSUAL SHORTNESS OF BREATH  *UNUSUAL BRUISING OR BLEEDING  TENDERNESS IN MOUTH AND THROAT WITH OR WITHOUT PRESENCE OF ULCERS  *URINARY PROBLEMS  *BOWEL PROBLEMS  UNUSUAL RASH Items with * indicate a potential emergency and should be followed up as soon as possible.  Feel free to call the clinic you have any questions or concerns. The clinic phone number is [phone].    

## 2014-03-26 ENCOUNTER — Ambulatory Visit (HOSPITAL_COMMUNITY): Payer: BC Managed Care – PPO | Admitting: Physical Therapy

## 2014-03-26 ENCOUNTER — Other Ambulatory Visit: Payer: Self-pay | Admitting: Oncology

## 2014-03-28 ENCOUNTER — Telehealth (HOSPITAL_COMMUNITY): Payer: Self-pay | Admitting: Physical Therapy

## 2014-03-28 ENCOUNTER — Ambulatory Visit
Admission: RE | Admit: 2014-03-28 | Discharge: 2014-03-28 | Disposition: A | Discharge status: Home / Self Care | Payer: BC Managed Care – PPO | Source: Ambulatory Visit | Attending: Oncology | Admitting: Oncology

## 2014-03-28 DIAGNOSIS — C50412 Malignant neoplasm of upper-outer quadrant of left female breast: Secondary | ICD-10-CM

## 2014-03-28 NOTE — Telephone Encounter (Signed)
Patient called to confirm that order was back from MD for Lymphedema garment. She said she would pick up order tomorrow. 03/28/14 NF

## 2014-03-30 ENCOUNTER — Ambulatory Visit (HOSPITAL_COMMUNITY): Payer: BC Managed Care – PPO | Admitting: Physical Therapy

## 2014-04-02 ENCOUNTER — Ambulatory Visit (HOSPITAL_COMMUNITY): Payer: BC Managed Care – PPO | Admitting: Physical Therapy

## 2014-04-03 ENCOUNTER — Ambulatory Visit: Payer: BC Managed Care – PPO | Admitting: Oncology

## 2014-04-03 ENCOUNTER — Other Ambulatory Visit: Payer: BC Managed Care – PPO

## 2014-04-04 ENCOUNTER — Ambulatory Visit (HOSPITAL_COMMUNITY): Payer: BC Managed Care – PPO | Admitting: Physical Therapy

## 2014-04-05 ENCOUNTER — Other Ambulatory Visit: Payer: Self-pay | Admitting: Oncology

## 2014-04-05 ENCOUNTER — Encounter (HOSPITAL_COMMUNITY): Payer: Self-pay

## 2014-04-05 ENCOUNTER — Ambulatory Visit (HOSPITAL_COMMUNITY): Admission: RE | Admit: 2014-04-05 | Payer: BC Managed Care – PPO | Source: Ambulatory Visit

## 2014-04-05 ENCOUNTER — Ambulatory Visit (HOSPITAL_COMMUNITY)
Admission: RE | Admit: 2014-04-05 | Discharge: 2014-04-05 | Disposition: A | Discharge status: Home / Self Care | Payer: BC Managed Care – PPO | Source: Ambulatory Visit | Attending: Oncology | Admitting: Oncology

## 2014-04-05 ENCOUNTER — Ambulatory Visit (HOSPITAL_COMMUNITY)
Admission: RE | Admit: 2014-04-05 | Discharge: 2014-04-05 | Disposition: A | Discharge status: Home / Self Care | Payer: BC Managed Care – PPO | Source: Ambulatory Visit

## 2014-04-05 ENCOUNTER — Other Ambulatory Visit (HOSPITAL_COMMUNITY): Payer: Self-pay | Admitting: Oncology

## 2014-04-05 DIAGNOSIS — G935 Compression of brain: Secondary | ICD-10-CM | POA: Diagnosis not present

## 2014-04-05 DIAGNOSIS — C50412 Malignant neoplasm of upper-outer quadrant of left female breast: Secondary | ICD-10-CM | POA: Diagnosis not present

## 2014-04-05 DIAGNOSIS — Z170 Estrogen receptor positive status [ER+]: Secondary | ICD-10-CM | POA: Insufficient documentation

## 2014-04-05 DIAGNOSIS — R51 Headache: Principal | ICD-10-CM

## 2014-04-05 DIAGNOSIS — M5489 Other dorsalgia: Secondary | ICD-10-CM | POA: Diagnosis not present

## 2014-04-05 DIAGNOSIS — R0602 Shortness of breath: Secondary | ICD-10-CM | POA: Diagnosis not present

## 2014-04-05 DIAGNOSIS — R079 Chest pain, unspecified: Secondary | ICD-10-CM | POA: Diagnosis not present

## 2014-04-05 DIAGNOSIS — M545 Low back pain: Secondary | ICD-10-CM | POA: Diagnosis present

## 2014-04-05 DIAGNOSIS — R519 Headache, unspecified: Secondary | ICD-10-CM

## 2014-04-05 MED ORDER — GADOBENATE DIMEGLUMINE 529 MG/ML IV SOLN
15.0000 mL | Freq: Once | INTRAVENOUS | Status: AC | PRN
  Administered 2014-04-05: 15 mL via INTRAVENOUS

## 2014-04-05 MED ORDER — IOHEXOL 300 MG/ML  SOLN
80.0000 mL | Freq: Once | INTRAMUSCULAR | Status: AC | PRN
  Administered 2014-04-05: 80 mL via INTRAVENOUS

## 2014-04-06 ENCOUNTER — Ambulatory Visit (HOSPITAL_COMMUNITY): Payer: BC Managed Care – PPO | Admitting: Physical Therapy

## 2014-04-13 ENCOUNTER — Telehealth: Payer: Self-pay

## 2014-04-13 ENCOUNTER — Ambulatory Visit: Payer: BC Managed Care – PPO

## 2014-04-13 ENCOUNTER — Ambulatory Visit (HOSPITAL_BASED_OUTPATIENT_CLINIC_OR_DEPARTMENT_OTHER): Payer: BC Managed Care – PPO | Admitting: Adult Health

## 2014-04-13 ENCOUNTER — Telehealth: Payer: Self-pay | Admitting: Adult Health

## 2014-04-13 ENCOUNTER — Encounter: Payer: Self-pay | Admitting: Adult Health

## 2014-04-13 ENCOUNTER — Ambulatory Visit (HOSPITAL_BASED_OUTPATIENT_CLINIC_OR_DEPARTMENT_OTHER): Payer: BC Managed Care – PPO

## 2014-04-13 ENCOUNTER — Other Ambulatory Visit: Payer: Self-pay | Admitting: Hematology and Oncology

## 2014-04-13 ENCOUNTER — Other Ambulatory Visit (HOSPITAL_BASED_OUTPATIENT_CLINIC_OR_DEPARTMENT_OTHER): Payer: BC Managed Care – PPO

## 2014-04-13 VITALS — BP 105/60 | HR 56 | Temp 97.9°F | Resp 18 | Ht 61.0 in | Wt 164.3 lb

## 2014-04-13 DIAGNOSIS — C50412 Malignant neoplasm of upper-outer quadrant of left female breast: Secondary | ICD-10-CM

## 2014-04-13 DIAGNOSIS — Z5111 Encounter for antineoplastic chemotherapy: Secondary | ICD-10-CM

## 2014-04-13 LAB — COMPREHENSIVE METABOLIC PANEL (CC13)
ALT: 22 U/L (ref 0–55)
AST: 16 U/L (ref 5–34)
Albumin: 3.6 g/dL (ref 3.5–5.0)
Alkaline Phosphatase: 58 U/L (ref 40–150)
Anion Gap: 9 mEq/L (ref 3–11)
BUN: 13.9 mg/dL (ref 7.0–26.0)
CO2: 27 mEq/L (ref 22–29)
Calcium: 9.7 mg/dL (ref 8.4–10.4)
Chloride: 106 mEq/L (ref 98–109)
Creatinine: 1 mg/dL (ref 0.6–1.1)
Glucose: 87 mg/dl (ref 70–140)
Potassium: 4 mEq/L (ref 3.5–5.1)
Sodium: 143 mEq/L (ref 136–145)
Total Bilirubin: 0.38 mg/dL (ref 0.20–1.20)
Total Protein: 6.8 g/dL (ref 6.4–8.3)

## 2014-04-13 LAB — CBC WITH DIFFERENTIAL/PLATELET
BASO%: 0.5 % (ref 0.0–2.0)
Basophils Absolute: 0 10*3/uL (ref 0.0–0.1)
EOS%: 1.3 % (ref 0.0–7.0)
Eosinophils Absolute: 0.1 10*3/uL (ref 0.0–0.5)
HCT: 34.5 % — ABNORMAL LOW (ref 34.8–46.6)
HGB: 11.3 g/dL — ABNORMAL LOW (ref 11.6–15.9)
LYMPH%: 18.2 % (ref 14.0–49.7)
MCH: 29.5 pg (ref 25.1–34.0)
MCHC: 32.7 g/dL (ref 31.5–36.0)
MCV: 90 fL (ref 79.5–101.0)
MONO#: 0.6 10*3/uL (ref 0.1–0.9)
MONO%: 8.3 % (ref 0.0–14.0)
NEUT#: 5 10*3/uL (ref 1.5–6.5)
NEUT%: 71.7 % (ref 38.4–76.8)
Platelets: 183 10*3/uL (ref 145–400)
RBC: 3.84 10*6/uL (ref 3.70–5.45)
RDW: 14.7 % — ABNORMAL HIGH (ref 11.2–14.5)
WBC: 7 10*3/uL (ref 3.9–10.3)
lymph#: 1.3 10*3/uL (ref 0.9–3.3)

## 2014-04-13 MED ORDER — HEPARIN SOD (PORK) LOCK FLUSH 100 UNIT/ML IV SOLN
500.0000 [IU] | Freq: Once | INTRAVENOUS | Status: DC | PRN
Start: 2014-04-13 — End: 2014-04-13
  Filled 2014-04-13: qty 5

## 2014-04-13 MED ORDER — SODIUM CHLORIDE 0.9 % IV SOLN
Freq: Once | INTRAVENOUS | Status: AC
  Administered 2014-04-13: 10:00:00 via INTRAVENOUS

## 2014-04-13 MED ORDER — ACETAMINOPHEN 325 MG PO TABS
ORAL_TABLET | ORAL | Status: AC
  Filled 2014-04-13: qty 2

## 2014-04-13 MED ORDER — DIPHENHYDRAMINE HCL 25 MG PO CAPS
50.0000 mg | ORAL_CAPSULE | Freq: Once | ORAL | Status: AC
  Administered 2014-04-13: 50 mg via ORAL

## 2014-04-13 MED ORDER — SODIUM CHLORIDE 0.9 % IJ SOLN
10.0000 mL | INTRAMUSCULAR | Status: DC | PRN
  Filled 2014-04-13: qty 10

## 2014-04-13 MED ORDER — ACETAMINOPHEN 325 MG PO TABS
650.0000 mg | ORAL_TABLET | Freq: Once | ORAL | Status: AC
  Administered 2014-04-13: 650 mg via ORAL

## 2014-04-13 MED ORDER — TRASTUZUMAB CHEMO INJECTION 440 MG
6.0000 mg/kg | Freq: Once | INTRAVENOUS | Status: AC
  Administered 2014-04-13: 420 mg via INTRAVENOUS
  Filled 2014-04-13: qty 20

## 2014-04-13 MED ORDER — DIPHENHYDRAMINE HCL 25 MG PO CAPS
ORAL_CAPSULE | ORAL | Status: AC
  Filled 2014-04-13: qty 2

## 2014-04-13 NOTE — Patient Instructions (Signed)
[facility] Discharge Instructions for Patients Receiving Chemotherapy  Today you received the following chemotherapy agents herceptin   To help prevent nausea and vomiting after your treatment, we encourage you to take your nausea medication as directed   If you develop nausea and vomiting that is not controlled by your nausea medication, call the clinic.   BELOW ARE SYMPTOMS THAT SHOULD BE REPORTED IMMEDIATELY:  *FEVER GREATER THAN 100.5 F  *CHILLS WITH OR WITHOUT FEVER  NAUSEA AND VOMITING THAT IS NOT CONTROLLED WITH YOUR NAUSEA MEDICATION  *UNUSUAL SHORTNESS OF BREATH  *UNUSUAL BRUISING OR BLEEDING  TENDERNESS IN MOUTH AND THROAT WITH OR WITHOUT PRESENCE OF ULCERS  *URINARY PROBLEMS  *BOWEL PROBLEMS  UNUSUAL RASH Items with * indicate a potential emergency and should be followed up as soon as possible.  Feel free to call the clinic you have any questions or concerns. The clinic phone number is [phone].  

## 2014-04-13 NOTE — Telephone Encounter (Signed)
Charting error.

## 2014-04-13 NOTE — Patient Instructions (Signed)
Chiari Malformation Chiari malformation (CM) causes brain tissue to settle into the spinal canal. There are four types of CM.  Type 1 is most common. It can go unnoticed until problems start, usually with headaches in young adults.  Type 2 is present at birth and always involves a form of spina bifida. Part of the spinal cord pushes through the spine and is exposed. Spina bifida usually causes paralysis of the legs.  Type 3 is more severe because it involves more brain tissue.  Type 4 is the most severe because the brain does not develop correctly. Adults and adolescents who are unaware they have Type 1 CM may have headaches that are located in the back of the head and get worse with coughing or straining. If more brain tissue is involved, problems may include dizziness, trouble with balance, and vision issues. Diagnosis is made by an MRI. TREATMENT  Babies may need surgery to repair spina bifida. Medications may be used to control pain. Some adults with CM may benefit from surgery for which the goal is to keep the malformation from getting worse. Document Released: 06/12/2002 Document Revised: 11/06/2013 Document Reviewed: 06/19/2008 [facility] Patient Information 2015 [place], [place]. This information is not intended to replace advice given to you by your health care provider. Make sure you discuss any questions you have with your health care provider.

## 2014-04-13 NOTE — Telephone Encounter (Signed)
, °

## 2014-04-13 NOTE — Progress Notes (Signed)
ID: [name] OB: 11/13/1960  MR#: [id number]  JAS#:[id number]  PCP: Rubbie Battiest, MD ; Pearson Forster, NP GYN:   Josefa Half, MD  SU: Alphonsa Overall, MD [place]:  Thea Silversmith, MD OTHER MD: Freida Busman, MD;  Neysa Bonito, MD  CHIEF COMPLAINT:  Left Breast Cancer TREATMENT: Anastrozole, trastuzumab  BREAST CANCER HISTORY: From the original intake note 04/12/2013:  [name] [name] palpated a mass in her left breast in late August 2014. She was already scheduled for screening mammography at [facility] and this was performed 03/13/2013. Indeed a possible mass was noted in the left breast and on 03/29/2013 the patient underwent diagnostic left mammography and left ultrasonography. This showed a 2.5 cm irregular mass in the upper outer quadrant of the left breast. This was palpable.by ultrasound there was a 1.6 cm irregular hypoechoic mass in the area in question, and in addition to enlarged left axillary lymph nodes were noted, the largest measuring 2.5 cm.  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  Bilateral breast MRI 04/11/2013 shows 3 abnormal enhancing masses in the upper outer quadrant of the left breast measuring altogether 6.4 cm. The largest separate mass measured 2.7 cm. There were abnormal or large lymph nodes in the left axilla. Ultrasound guided biopsy of the 2 smaller masses has been scheduled.  The patient's subsequent history is as detailed below   INTERVAL HISTORY: [name] [name] returns today for followup of her left-sided breast cancer. From a breast cancer perspective she is doing well and is due for her final Herceptin today.  She has  however been having increased headaches that begin behind her eyes and are constant. Tylenol relieves them but only occasionally.  She wants to know her MRI results and CT chest results due to some occasional left clavicle pain.  Otherwise, a 10 point ROS is neg.   REVIEW OF SYSTEMS: A 10 point review of systems was conducted and is otherwise negative except for what is noted above.     PAST MEDICAL HISTORY: Past Medical History  Diagnosis Date  . Chest pain 2009    Consultation-[facility], negative chest CT; nl echo in 2005; h/o palpitations  . Palpitations   . Degenerative joint disease     + degenerative joint disease of the lumbosacral spine  . Colitis 2010    not IBD  . Herpes simplex type II infection   . Allergic rhinitis   . Hypercholesteremia     "slightly high"  . Anxiety   . Heart murmur     "small"  . GERD (gastroesophageal reflux disease)     "a little"  . Depression   . Hypertension   . Anemia, unspecified 05/30/2013  . Wears glasses   . Breast cancer 03/31/13    left  . Cancer     PAST SURGICAL HISTORY: Past Surgical History  Procedure Laterality Date  . Colonoscopy  10/2010    proctitis; melanosis coli  . Tubal ligation    . Breast excisional biopsy  04/2003    Left; benign disease  . Right oophorectomy  03/13/2007  . Abdominal hysterectomy  03/13/2007    TAH ?BSO--Dr Levin Bacon, [place]  .  Eye surgery Left     "fix lazy eye"  . Portacath placement Right 04/21/2013    Procedure: INSERTION PORT-A-CATH;  Surgeon: [name], MD;  Location: [facility];  Service: General;  Laterality: Right;  . Breast lumpectomy with needle localization and axillary lymph node dissection Left 09/12/2013    Procedure: BREAST LUMPECTOMY WITH NEEDLE LOCALIZATION AND AXILLARY LYMPH NODE DISSECTION;  Surgeon: [name], MD;  Location: [place];  Service: General;  Laterality: Left;  and axilla  . Breast surgery      FAMILY HISTORY Family History  Problem  Relation Age of Onset  . Aneurysm Mother     Cerebral  . Hypertension Mother   . Hyperlipidemia Mother   . Stroke Mother   . Coronary artery disease Father   . Hypertension Father   . Hyperlipidemia Father   . Lung cancer Brother   . Cancer Brother     Lung  The patient's father died at the age of 38 from a myocardial infarction. The patient's mother died at the age of 82 from a stroke. The patient had 6 brothers, 2 sisters. One brother has a history of lung cancer in the setting of tobacco abuse. Another brother had multiple myeloma. There is no history of breast or ovarian cancer in the family  GYNECOLOGIC HISTORY: (Updated 09/25/2013) Menarche age 13, first live birth age 46, the patient is [name] [name]. She status post abdominal hysterectomy with bilateral salpingo-oophorectomy. She never took hormone replacement.   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 Her husband [name] [name] works in Charity fundraiser. Daughter [name] [name] formerly lived at home with the patient with her 3 children, all under the age of 70; they have subsequently moved out. She is not employed. Son [name] [name] lives in [place] and is not employed. Daughter [name] [name] lives in [place] and is a Barrister's clerk. Daughter [name] [name] is a Education officer, museum in [place]. The patient has 10 grandchildren. She attends [employer]     ADVANCED DIRECTIVES: Not in place   HEALTH MAINTENANCE:  (Updated 09/25/2013)  History  Substance Use Topics  . Smoking status: Former Smoker -- 0.50 packs/day for 15 years    Types: Cigarettes    Quit date: 07/06/1982  . Smokeless tobacco: Never Used  . Alcohol Use: No     Colonoscopy:2012  PAP:2014   Bone density: Never  Lipid panel: Not on file    Allergies  Allergen Reactions  . Doxycycline Other (See Comments)    Unknown    Current Outpatient Prescriptions  Medication Sig  Dispense Refill  . ALPRAZolam (XANAX) 1 MG tablet TAKE 1 TABLET THREE TIMES DAILY AS NEEDED FOR ANXIETY  60 tablet  5  . anastrozole (ARIMIDEX) 1 MG tablet Take 1 tablet (1 mg total) by mouth daily.  90 tablet  4  . atenolol (TENORMIN) 50 MG tablet Take 50 mg by mouth every evening.      . celecoxib (CELEBREX) 200 MG capsule Take 200 mg by mouth every evening.       . citalopram (CELEXA) 40 MG tablet Take 40 mg by mouth every evening.      . diphenhydramine-acetaminophen (TYLENOL PM) 25-500 MG TABS Take 2 tablets by mouth at bedtime.      . hyaluronate sodium (RADIAPLEXRX) GEL Apply 1 application topically 2 (two) times daily.      Marland Kitchen lidocaine-prilocaine (  EMLA) cream Apply topically as needed.  30 g  0  . pantoprazole (PROTONIX) 40 MG tablet Take 1 tablet (40 mg total) by mouth daily.  90 tablet  1  . sucralfate (CARAFATE) 1 G tablet Take 1 tablet (1 g total) by mouth 4 (four) times daily -  with meals and at bedtime. Prn reflux  40 tablet  0  . valACYclovir (VALTREX) 1000 MG tablet TAKE 1 TABLET BY MOUTH DAILY  30 tablet  5  . valACYclovir (VALTREX) 1000 MG tablet TAKE 1 TABLET BY MOUTH DAILY  30 tablet  5  . [DISCONTINUED] potassium chloride SA (K-DUR,KLOR-CON) 20 MEQ tablet Take 1 tablet (20 mEq total) by mouth 2 (two) times daily.  20 tablet  1   No current facility-administered medications for this visit.    OBJECTIVE: Middle-aged [place] American woman who appears stated age 53 Vitals:   04/13/14 0908  BP: 105/60  Pulse: 56  Temp: 97.9 F (36.6 C)  Resp: 18  Body mass index is 31.06 kg/(m^2).  ECOG: 1 Filed Weights   04/13/14 0908  Weight: 164 lb 4.8 oz (74.526 kg)  GENERAL: Patient is a well appearing female in no acute distress HEENT:  Sclerae anicteric.  Oropharynx clear and moist. No ulcerations or evidence of oropharyngeal candidiasis. Neck is supple.  NODES:  No cervical, supraclavicular, or axillary lymphadenopathy palpated.  BREAST EXAM:  S/p left lumpectomy without  nodularity, no nodules or masses, right breast no nodules or masses, benign bilateral breast exam.  LUNGS:  Clear to auscultation bilaterally.  No wheezes or rhonchi. HEART:  Regular rate and rhythm. No murmur appreciated. ABDOMEN:  Soft, nontender.  Positive, normoactive bowel sounds. No organomegaly palpated. MSK:  No focal spinal tenderness to palpation. Full range of motion bilaterally in the upper extremities. EXTREMITIES:  No peripheral edema.   SKIN:  Clear with no obvious rashes or skin changes. No nail dyscrasia. NEURO:  Nonfocal. Well oriented.  Appropriate affect.  LAB RESULTS:   Lab Results  Component Value Date   WBC 7.0 04/13/2014   NEUTROABS 5.0 04/13/2014   HGB 11.3* 04/13/2014   HCT 34.5* 04/13/2014   MCV 90.0 04/13/2014   PLT 183 04/13/2014      Chemistry      Component Value Date/Time   NA 143 04/13/2014 0752   NA 139 06/06/2008 0545   K 4.0 04/13/2014 0752   K 3.8 06/06/2008 0545   CL 104 06/06/2008 0545   CO2 27 04/13/2014 0752   CO2 26 06/06/2008 0545   BUN 13.9 04/13/2014 0752   BUN 6 06/06/2008 0545   CREATININE 1.0 04/13/2014 0752   CREATININE 0.77 06/06/2008 0545      Component Value Date/Time   CALCIUM 9.7 04/13/2014 0752   CALCIUM 8.7 06/06/2008 0545   ALKPHOS 58 04/13/2014 0752   ALKPHOS 62 04/12/2008 1725   AST 16 04/13/2014 0752   AST 21 04/12/2008 1725   ALT 22 04/13/2014 0752   ALT 28 04/12/2008 1725   BILITOT 0.38 04/13/2014 0752   BILITOT 0.5 04/12/2008 1725         STUDIES: Transthoracic Echocardiography  Patient: [name], [name] MR #: [id number] Study Date: 03/01/2014 Gender: F Age: 33 Height: 154.9 cm Weight: 69.9 kg BSA: 1.76 m^2 Pt. Status: Room:  ATTENDING Luking, W S ORDERING Berry, Amy G PERFORMING [facility], Outpatient SONOGRAPHER Darlina Sicilian, RDCS  cc:  ------------------------------------------------------------------- LV EF: 60% - 65%  -----------------------------  CLINICAL DATA: Breast cancer. Headaches.  EXAM:  MRI  HEAD WITHOUT AND WITH CONTRAST  TECHNIQUE:  Multiplanar, multiecho pulse sequences of the brain and surrounding  structures were obtained without and with intravenous contrast.  CONTRAST: 71m MULTIHANCE GADOBENATE DIMEGLUMINE 529 MG/ML IV SOLN  COMPARISON: None.  FINDINGS:  No evidence for acute infarction, hemorrhage, mass lesion, or  extra-axial fluid. Normal for age cerebral volume. Mild increased  signal in the subcortical and periventricular white matter, likely  chronic microvascular ischemic change. Partial empty sella. No  pineal abnormality.  There is significant impaction of the cerebellar tonsils in the  foramen magnum, with a abnormal Peg-like shape and 15 mm of  tonsillar descent below an imaginary line across the foramen magnum.  There is moderate deformity of the cervicomedullary junction without  edema. There is no hydrocephalus or visible hydromyelia.  Post infusion, no abnormal enhancement of brain or meninges. No  intracranial metastatic disease is evident. Major dural venous  sinuses are patent.  No osseous lesions. Flow voids are maintained. Sinuses and mastoids  show no acute findings. T2 bright signal on the LEFT mastoid,  possible coalescence, likely chronic inflammatory process.  IMPRESSION:  No intracranial metastatic disease is evident.  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 No hydrocephalus or hydromyelia.  Neurosurgical consultation is warranted, as the symptom of headaches  could suggest altered CSF dynamics.  Electronically Signed  By: JRolla FlattenM.D.  On: 04/05/2014 16:08  CLINICAL DATA: Breast cancer. History of left lumpectomy and  radiation therapy. Ongoing chemotherapy. Left-sided chest pain and  shortness of breath.  EXAM:  CT CHEST WITH CONTRAST  TECHNIQUE:  Multidetector CT imaging of the chest was performed during  intravenous contrast administration.  CONTRAST: 870m OMNIPAQUE IOHEXOL 300 MG/ML SOLN  COMPARISON: Chest CT 04/26/2013 and PET-CT same date.  FINDINGS:  There are surgical changes related to a left-sided lumpectomy with a  deep postoperative fluid collection up against the pectoralis major  muscle. No findings to suggest recurrent tumor. Surgical changes  also noted from a left axillary lymph node dissection. Residual scar  tissue but no recurrent adenopathy. No subpectoral or  supraclavicular adenopathy. Skin thickening and interstitial changes  in the left breast likely due to radiation change. A right-sided  Port-A-Cath is noted.  The bony thorax is intact. No destructive bone lesions or spinal  canal compromise.  The heart is normal in size. No pericardial effusion. No mediastinal  or hilar mass or adenopathy. The aorta is normal in caliber. No  dissection. The esophagus is grossly normal.  Examination of the lung parenchyma demonstrates probable left upper  lobe radiation changes. No infiltrates, edema or effusions. No  pulmonary nodules to suggest pulmonary metastatic disease.  The upper abdomen is unremarkable.  IMPRESSION:  Postsurgical and post radiation changes involving the left breast,  chest wall and left upper lobe.  No findings worrisome for recurrent disease or metastatic disease.  Electronically Signed  By: MaKalman Jewels.D.  On: 04/05/2014 13:08  ASSESSMENT/PLAN: [email or web address]. [place]   (1)  status post left breast biopsy 03/29/2013 for a clinical T2-T3 pN1, stage IIB-IIIA invasive ductal carcinoma, grade 2-3, estrogen receptor 39% positive (with moderate staining intensity), progesterone receptor negative, with an MIB-1 of 83%, and HER-2 amplified by CISH, with a ratio of 7.5 and 15 HER-2 copies per cell  (2) neoadjuvant chemotherapy, with trastuzumab/ pertuzumab/ carboplatin/ docetaxel repeated every 3 weeks x6, completed 08/21/2013. Pertuzumab  held after 4 cycles secondary  to diarrhea. The patient received  Neulasta on day 2 at [facility] per her request.   (3) Trastuzumab to be continued for total of one year (to mid October 2015). Most recent echo 03/01/2014 showed a well preserved ejection fraction.  (4)  status post left lumpectomy and axillary node dissection 09/12/2013, with a complete pathologic response (a total of 16 lymph nodes were examined,1 sentinel and 15 non-sentinel),  YpT0, ypN0.  (5) adjuvant radiation at [facility] completed 12/08/2013  (6) to start anastrozole 01/16/2014  (7) status post remote TAH/ BSO  PLAN:  [name] [name] is doing well today and she will proceed with her final Herceptin therapy today.  She is also tolerating Anastrazole well.  We discussed her continued headaches and MRI results in detail.  She will be referred to Dr. Sherwood Gambler about this. She is very concerned about this and I gave her information about this in her AVS.  I also gave her the CT chest results which determine no etiology for her clavicular pain.  She says the pain has improved.  The results to both are above.    She will continue taking Anastrazole daily.  She is cleared for port removal.  We will contact Dr. Pollie Friar office regarding this.    Per Dr. Garald Balding is very favorable that she is tolerating the anastrozole with no significant side effects. The plan will be to continue that at minimum of 5 years."  [name] [name] will return in 3 months for labs and evaluation.  She has a good understanding of the overall plan. She agrees with it. She knows the goal of treatment in her case is cure. She will call with any problems that may develop before her next visit here.   I spent 25 minutes counseling the patient face to face.  The total time spent in the appointment was 30 minutes.  Minette Headland, [place] [phone] 04/13/2014 9:09 AM

## 2014-04-16 ENCOUNTER — Other Ambulatory Visit: Payer: Self-pay | Admitting: *Deleted

## 2014-04-18 ENCOUNTER — Other Ambulatory Visit: Payer: Self-pay | Admitting: *Deleted

## 2014-04-30 ENCOUNTER — Other Ambulatory Visit (HOSPITAL_COMMUNITY): Payer: Self-pay | Admitting: Neurosurgery

## 2014-05-02 ENCOUNTER — Encounter (HOSPITAL_COMMUNITY): Payer: Self-pay | Admitting: Pharmacy Technician

## 2014-05-03 ENCOUNTER — Other Ambulatory Visit: Payer: Self-pay | Admitting: Family Medicine

## 2014-05-03 ENCOUNTER — Other Ambulatory Visit (HOSPITAL_COMMUNITY): Payer: Self-pay | Admitting: Family Medicine

## 2014-05-07 ENCOUNTER — Encounter: Payer: Self-pay | Admitting: Adult Health

## 2014-05-08 ENCOUNTER — Encounter (HOSPITAL_COMMUNITY)
Admission: RE | Admit: 2014-05-08 | Discharge: 2014-05-08 | Disposition: A | Discharge status: Home / Self Care | Payer: BC Managed Care – PPO | Source: Ambulatory Visit | Attending: Neurosurgery | Admitting: Neurosurgery

## 2014-05-08 ENCOUNTER — Encounter (HOSPITAL_COMMUNITY): Payer: Self-pay

## 2014-05-08 HISTORY — DX: Headache, unspecified: R51.9

## 2014-05-08 HISTORY — DX: Headache: R51

## 2014-05-08 HISTORY — DX: Cardiac arrhythmia, unspecified: I49.9

## 2014-05-08 LAB — CBC
HCT: 33.9 % — ABNORMAL LOW (ref 36.0–46.0)
Hemoglobin: 11.5 g/dL — ABNORMAL LOW (ref 12.0–15.0)
MCH: 29.3 pg (ref 26.0–34.0)
MCHC: 33.9 g/dL (ref 30.0–36.0)
MCV: 86.5 fL (ref 78.0–100.0)
Platelets: 183 10*3/uL (ref 150–400)
RBC: 3.92 MIL/uL (ref 3.87–5.11)
RDW: 13.7 % (ref 11.5–15.5)
WBC: 7.3 10*3/uL (ref 4.0–10.5)

## 2014-05-08 LAB — BASIC METABOLIC PANEL
Anion gap: 15 (ref 5–15)
BUN: 14 mg/dL (ref 6–23)
CO2: 26 mEq/L (ref 19–32)
Calcium: 9.2 mg/dL (ref 8.4–10.5)
Chloride: 101 mEq/L (ref 96–112)
Creatinine, Ser: 1 mg/dL (ref 0.50–1.10)
GFR calc Af Amer: 73 mL/min — ABNORMAL LOW (ref >90)
GFR calc non Af Amer: 63 mL/min — ABNORMAL LOW (ref >90)
Glucose, Bld: 83 mg/dL (ref 70–99)
Potassium: 4.4 mEq/L (ref 3.7–5.3)
Sodium: 142 mEq/L (ref 137–147)

## 2014-05-08 LAB — SURGICAL PCR SCREEN
MRSA, PCR: NEGATIVE
Staphylococcus aureus: NEGATIVE

## 2014-05-08 NOTE — Pre-Procedure Instructions (Signed)
[name] [name]  05/08/2014   Your procedure is scheduled on:  Thursday, November 5.  Report to [facility] Admitting at  8:30AM.  Call this number if you have problems the morning of surgery: [phone]   Remember:   Do not eat food or drink liquids after midnight.   Take these medicines the morning of surgery with A SIP OF WATER: atenolol (TENORMIN), citalopram (CELEXA), pantoprazole (PROTONIX), anastrozole (ARIMIDEX).   Do not wear jewelry, make-up or nail polish.  Do not wear lotions, powders, or perfumes.   Do not shave 48 hours prior to surgery..  Do not bring valuables to the hospital.              [facility] is not responsible for any belongings or valuables.               Contacts, dentures or bridgework may not be worn into surgery.  Leave suitcase in the car. After surgery it may be brought to your room.  For patients admitted to the hospital, discharge time is determined by your vtreatment team.                Special Instructions: Review  [place] - Preparing For Surgery.   Please read over the following fact sheets that you were given: Pain Booklet, Coughing and Deep Breathing and Surgical Site Infection Prevention

## 2014-05-09 MED ORDER — CEFAZOLIN SODIUM-DEXTROSE 2-3 GM-% IV SOLR
2.0000 g | INTRAVENOUS | Status: AC
  Administered 2014-05-10 (×2): 2 g via INTRAVENOUS

## 2014-05-10 ENCOUNTER — Inpatient Hospital Stay (HOSPITAL_COMMUNITY)
Admission: RE | Admit: 2014-05-10 | Discharge: 2014-05-15 | DRG: 027 | Disposition: A | Discharge status: Home / Self Care | Payer: BC Managed Care – PPO | Source: Ambulatory Visit | Attending: Neurosurgery | Admitting: Neurosurgery

## 2014-05-10 ENCOUNTER — Encounter (HOSPITAL_COMMUNITY): Payer: Self-pay | Admitting: *Deleted

## 2014-05-10 ENCOUNTER — Inpatient Hospital Stay (HOSPITAL_COMMUNITY): Payer: BC Managed Care – PPO | Admitting: Anesthesiology

## 2014-05-10 ENCOUNTER — Encounter (HOSPITAL_COMMUNITY)
Admission: RE | Disposition: A | Discharge status: Home / Self Care | Payer: Self-pay | Source: Ambulatory Visit | Attending: Neurosurgery

## 2014-05-10 DIAGNOSIS — Z853 Personal history of malignant neoplasm of breast: Secondary | ICD-10-CM | POA: Diagnosis not present

## 2014-05-10 DIAGNOSIS — K219 Gastro-esophageal reflux disease without esophagitis: Secondary | ICD-10-CM | POA: Diagnosis present

## 2014-05-10 DIAGNOSIS — I10 Essential (primary) hypertension: Secondary | ICD-10-CM | POA: Diagnosis present

## 2014-05-10 DIAGNOSIS — G935 Compression of brain: Principal | ICD-10-CM | POA: Diagnosis present

## 2014-05-10 DIAGNOSIS — E780 Pure hypercholesterolemia: Secondary | ICD-10-CM | POA: Diagnosis present

## 2014-05-10 DIAGNOSIS — F329 Major depressive disorder, single episode, unspecified: Secondary | ICD-10-CM | POA: Diagnosis present

## 2014-05-10 DIAGNOSIS — Z87891 Personal history of nicotine dependence: Secondary | ICD-10-CM

## 2014-05-10 HISTORY — PX: SUBOCCIPITAL CRANIECTOMY CERVICAL LAMINECTOMY: SHX5404

## 2014-05-10 SURGERY — SUBOCCIPITAL CRANIECTOMY CERVICAL LAMINECTOMY/DURAPLASTY
Anesthesia: General | Site: Head

## 2014-05-10 MED ORDER — FENTANYL CITRATE 0.05 MG/ML IJ SOLN
INTRAMUSCULAR | Status: AC
  Filled 2014-05-10: qty 5

## 2014-05-10 MED ORDER — HYDROCODONE-ACETAMINOPHEN 5-325 MG PO TABS
1.0000 | ORAL_TABLET | ORAL | Status: DC | PRN
  Administered 2014-05-11: 1 via ORAL
  Filled 2014-05-10: qty 1

## 2014-05-10 MED ORDER — EPHEDRINE SULFATE 50 MG/ML IJ SOLN
INTRAMUSCULAR | Status: DC | PRN
  Administered 2014-05-10: 5 mg via INTRAVENOUS

## 2014-05-10 MED ORDER — ONDANSETRON HCL 4 MG/2ML IJ SOLN: 4.0000 mg | Freq: Once | INTRAMUSCULAR | Status: DC | PRN

## 2014-05-10 MED ORDER — PANTOPRAZOLE SODIUM 40 MG PO TBEC: 40.0000 mg | DELAYED_RELEASE_TABLET | Freq: Every day | ORAL | Status: DC

## 2014-05-10 MED ORDER — HEMOSTATIC AGENTS (NO CHARGE) OPTIME
TOPICAL | Status: DC | PRN
  Administered 2014-05-10 (×2): 1 via TOPICAL

## 2014-05-10 MED ORDER — LIDOCAINE-EPINEPHRINE 1 %-1:100000 IJ SOLN
INTRAMUSCULAR | Status: DC | PRN
  Administered 2014-05-10: 10 mL

## 2014-05-10 MED ORDER — PROPOFOL 10 MG/ML IV BOLUS
INTRAVENOUS | Status: DC | PRN
Start: 2014-05-10 — End: 2014-05-10
  Administered 2014-05-10: 200 mg via INTRAVENOUS

## 2014-05-10 MED ORDER — THROMBIN 20000 UNITS EX SOLR
CUTANEOUS | Status: DC | PRN
  Administered 2014-05-10: 13:00:00 via TOPICAL

## 2014-05-10 MED ORDER — BUPIVACAINE-EPINEPHRINE 0.5% -1:200000 IJ SOLN
INTRAMUSCULAR | Status: DC | PRN
  Administered 2014-05-10: 10 mL

## 2014-05-10 MED ORDER — HYDRALAZINE HCL 20 MG/ML IJ SOLN: 5.0000 mg | INTRAMUSCULAR | Status: DC | PRN

## 2014-05-10 MED ORDER — DEXAMETHASONE SODIUM PHOSPHATE 4 MG/ML IJ SOLN
4.0000 mg | Freq: Three times a day (TID) | INTRAMUSCULAR | Status: DC
  Filled 2014-05-10 (×2): qty 1

## 2014-05-10 MED ORDER — ONDANSETRON HCL 4 MG PO TABS: 4.0000 mg | ORAL_TABLET | ORAL | Status: DC | PRN

## 2014-05-10 MED ORDER — HYDROMORPHONE HCL 1 MG/ML IJ SOLN
0.2500 mg | INTRAMUSCULAR | Status: DC | PRN
  Administered 2014-05-10: 0.25 mg via INTRAVENOUS

## 2014-05-10 MED ORDER — GLYCOPYRROLATE 0.2 MG/ML IJ SOLN
INTRAMUSCULAR | Status: DC | PRN
  Administered 2014-05-10: 0.6 mg via INTRAVENOUS

## 2014-05-10 MED ORDER — BISACODYL 10 MG RE SUPP: 10.0000 mg | Freq: Every day | RECTAL | Status: DC | PRN

## 2014-05-10 MED ORDER — ACETAMINOPHEN 10 MG/ML IV SOLN
INTRAVENOUS | Status: AC
  Administered 2014-05-10: 1000 mg via INTRAVENOUS
  Filled 2014-05-10: qty 100

## 2014-05-10 MED ORDER — SODIUM CHLORIDE 0.9 % IV SOLN
INTRAVENOUS | Status: DC
  Administered 2014-05-10 – 2014-05-11 (×2): via INTRAVENOUS

## 2014-05-10 MED ORDER — NEOSTIGMINE METHYLSULFATE 10 MG/10ML IV SOLN
INTRAVENOUS | Status: AC
  Filled 2014-05-10: qty 1

## 2014-05-10 MED ORDER — DEXAMETHASONE SODIUM PHOSPHATE 10 MG/ML IJ SOLN
6.0000 mg | Freq: Four times a day (QID) | INTRAMUSCULAR | Status: AC
  Administered 2014-05-10 – 2014-05-11 (×4): 6 mg via INTRAVENOUS
  Filled 2014-05-10: qty 0.6
  Filled 2014-05-10 (×3): qty 1

## 2014-05-10 MED ORDER — LABETALOL HCL 5 MG/ML IV SOLN
INTRAVENOUS | Status: DC | PRN
  Administered 2014-05-10: 5 mg via INTRAVENOUS

## 2014-05-10 MED ORDER — ONDANSETRON HCL 4 MG/2ML IJ SOLN
INTRAMUSCULAR | Status: AC
  Filled 2014-05-10: qty 2

## 2014-05-10 MED ORDER — ONDANSETRON HCL 4 MG/2ML IJ SOLN
INTRAMUSCULAR | Status: DC | PRN
  Administered 2014-05-10 (×2): 4 mg via INTRAVENOUS

## 2014-05-10 MED ORDER — ALPRAZOLAM 1 MG PO TABS: 1.0000 mg | ORAL_TABLET | Freq: Every day | ORAL | Status: DC

## 2014-05-10 MED ORDER — ROCURONIUM BROMIDE 100 MG/10ML IV SOLN
INTRAVENOUS | Status: DC | PRN
  Administered 2014-05-10: 10 mg via INTRAVENOUS
  Administered 2014-05-10: 50 mg via INTRAVENOUS
  Administered 2014-05-10 (×5): 10 mg via INTRAVENOUS

## 2014-05-10 MED ORDER — MORPHINE SULFATE 2 MG/ML IJ SOLN
1.0000 mg | INTRAMUSCULAR | Status: DC | PRN
  Administered 2014-05-10 – 2014-05-12 (×9): 2 mg via INTRAVENOUS
  Filled 2014-05-10 (×8): qty 1

## 2014-05-10 MED ORDER — CEFAZOLIN SODIUM-DEXTROSE 2-3 GM-% IV SOLR
INTRAVENOUS | Status: AC
  Filled 2014-05-10: qty 50

## 2014-05-10 MED ORDER — 0.9 % SODIUM CHLORIDE (POUR BTL) OPTIME
TOPICAL | Status: DC | PRN
  Administered 2014-05-10 (×4): 1000 mL

## 2014-05-10 MED ORDER — ARTIFICIAL TEARS OP OINT
TOPICAL_OINTMENT | OPHTHALMIC | Status: AC
  Filled 2014-05-10: qty 3.5

## 2014-05-10 MED ORDER — MIDAZOLAM HCL 5 MG/5ML IJ SOLN
INTRAMUSCULAR | Status: DC | PRN
  Administered 2014-05-10: 1 mg via INTRAVENOUS

## 2014-05-10 MED ORDER — NEOSTIGMINE METHYLSULFATE 10 MG/10ML IV SOLN
INTRAVENOUS | Status: DC | PRN
  Administered 2014-05-10: 4 mg via INTRAVENOUS

## 2014-05-10 MED ORDER — MAGNESIUM HYDROXIDE 400 MG/5ML PO SUSP: 30.0000 mL | Freq: Every day | ORAL | Status: DC | PRN

## 2014-05-10 MED ORDER — GLYCOPYRROLATE 0.2 MG/ML IJ SOLN
INTRAMUSCULAR | Status: AC
  Filled 2014-05-10: qty 2

## 2014-05-10 MED ORDER — SODIUM CHLORIDE 0.9 % IR SOLN
Status: DC | PRN
  Administered 2014-05-10: 13:00:00

## 2014-05-10 MED ORDER — CITALOPRAM HYDROBROMIDE 40 MG PO TABS
40.0000 mg | ORAL_TABLET | Freq: Every day | ORAL | Status: DC
  Administered 2014-05-10: 40 mg via ORAL
  Filled 2014-05-10 (×2): qty 1

## 2014-05-10 MED ORDER — ATENOLOL 50 MG PO TABS
50.0000 mg | ORAL_TABLET | Freq: Every day | ORAL | Status: DC
  Administered 2014-05-10: 50 mg via ORAL
  Filled 2014-05-10 (×2): qty 1

## 2014-05-10 MED ORDER — EPHEDRINE SULFATE 50 MG/ML IJ SOLN
INTRAMUSCULAR | Status: AC
  Filled 2014-05-10: qty 1

## 2014-05-10 MED ORDER — SUCRALFATE 1 G PO TABS
1.0000 g | ORAL_TABLET | Freq: Three times a day (TID) | ORAL | Status: DC
  Administered 2014-05-10 – 2014-05-15 (×9): 1 g via ORAL
  Filled 2014-05-10 (×17): qty 1

## 2014-05-10 MED ORDER — PROPOFOL 10 MG/ML IV BOLUS
INTRAVENOUS | Status: AC
  Filled 2014-05-10: qty 20

## 2014-05-10 MED ORDER — HYDROMORPHONE HCL 1 MG/ML IJ SOLN
INTRAMUSCULAR | Status: AC
  Filled 2014-05-10: qty 1

## 2014-05-10 MED ORDER — SODIUM CHLORIDE 0.9 % IV SOLN
INTRAVENOUS | Status: DC | PRN
  Administered 2014-05-10 (×3): via INTRAVENOUS

## 2014-05-10 MED ORDER — LABETALOL HCL 5 MG/ML IV SOLN
5.0000 mg | INTRAVENOUS | Status: DC | PRN
Start: 2014-05-10 — End: 2014-05-14

## 2014-05-10 MED ORDER — CYCLOBENZAPRINE HCL 10 MG PO TABS
10.0000 mg | ORAL_TABLET | Freq: Three times a day (TID) | ORAL | Status: DC | PRN
  Administered 2014-05-10 – 2014-05-15 (×4): 10 mg via ORAL
  Filled 2014-05-10 (×7): qty 1

## 2014-05-10 MED ORDER — PROMETHAZINE HCL 25 MG PO TABS: 12.5000 mg | ORAL_TABLET | ORAL | Status: DC | PRN

## 2014-05-10 MED ORDER — FENTANYL CITRATE 0.05 MG/ML IJ SOLN
INTRAMUSCULAR | Status: DC | PRN
  Administered 2014-05-10: 150 ug via INTRAVENOUS
  Administered 2014-05-10 (×6): 50 ug via INTRAVENOUS

## 2014-05-10 MED ORDER — DEXAMETHASONE SODIUM PHOSPHATE 10 MG/ML IJ SOLN
INTRAMUSCULAR | Status: AC
  Filled 2014-05-10: qty 1

## 2014-05-10 MED ORDER — SODIUM CHLORIDE 0.9 % IJ SOLN
INTRAMUSCULAR | Status: AC
  Filled 2014-05-10: qty 10

## 2014-05-10 MED ORDER — INFLUENZA VAC SPLIT QUAD 0.5 ML IM SUSY
0.5000 mL | PREFILLED_SYRINGE | INTRAMUSCULAR | Status: DC
Start: 2014-05-11 — End: 2014-05-14
  Filled 2014-05-10: qty 0.5

## 2014-05-10 MED ORDER — SODIUM CHLORIDE 0.9 % IV SOLN: Freq: Once | INTRAVENOUS | Status: DC

## 2014-05-10 MED ORDER — LIDOCAINE HCL (CARDIAC) 20 MG/ML IV SOLN
INTRAVENOUS | Status: DC | PRN
  Administered 2014-05-10: 50 mg via INTRAVENOUS

## 2014-05-10 MED ORDER — METHYLENE BLUE 1 % INJ SOLN
INTRAMUSCULAR | Status: DC | PRN
  Administered 2014-05-10: 10 mL

## 2014-05-10 MED ORDER — ANASTROZOLE 1 MG PO TABS
1.0000 mg | ORAL_TABLET | Freq: Every day | ORAL | Status: DC
  Administered 2014-05-11 – 2014-05-15 (×5): 1 mg via ORAL
  Filled 2014-05-10 (×7): qty 1

## 2014-05-10 MED ORDER — WHITE PETROLATUM GEL
Status: AC
  Filled 2014-05-10: qty 5

## 2014-05-10 MED ORDER — DEXAMETHASONE SODIUM PHOSPHATE 10 MG/ML IJ SOLN
INTRAMUSCULAR | Status: DC | PRN
  Administered 2014-05-10: 10 mg via INTRAVENOUS

## 2014-05-10 MED ORDER — ROCURONIUM BROMIDE 50 MG/5ML IV SOLN
INTRAVENOUS | Status: AC
  Filled 2014-05-10: qty 1

## 2014-05-10 MED ORDER — VALACYCLOVIR HCL 500 MG PO TABS
1000.0000 mg | ORAL_TABLET | Freq: Every day | ORAL | Status: DC
  Administered 2014-05-10: 1000 mg via ORAL
  Filled 2014-05-10 (×2): qty 2

## 2014-05-10 MED ORDER — ALPRAZOLAM 0.5 MG PO TABS
1.0000 mg | ORAL_TABLET | Freq: Every day | ORAL | Status: DC
  Administered 2014-05-10 – 2014-05-14 (×5): 1 mg via ORAL
  Filled 2014-05-10 (×5): qty 2

## 2014-05-10 MED ORDER — ONDANSETRON HCL 4 MG/2ML IJ SOLN
4.0000 mg | INTRAMUSCULAR | Status: DC | PRN
  Filled 2014-05-10: qty 2

## 2014-05-10 MED ORDER — MIDAZOLAM HCL 2 MG/2ML IJ SOLN
INTRAMUSCULAR | Status: AC
  Filled 2014-05-10: qty 2

## 2014-05-10 MED ORDER — PANTOPRAZOLE SODIUM 40 MG IV SOLR: 40.0000 mg | Freq: Every day | INTRAVENOUS | Status: DC

## 2014-05-10 MED ORDER — DEXAMETHASONE SODIUM PHOSPHATE 4 MG/ML IJ SOLN
4.0000 mg | Freq: Four times a day (QID) | INTRAMUSCULAR | Status: DC
  Administered 2014-05-11 – 2014-05-12 (×3): 4 mg via INTRAVENOUS
  Filled 2014-05-10 (×4): qty 1

## 2014-05-10 SURGICAL SUPPLY — 70 items
APL SKNCLS STERI-STRIP NONHPOA (GAUZE/BANDAGES/DRESSINGS)
APL SRG 60D 8 XTD TIP BNDBL (TIP) ×2
BENZOIN TINCTURE PRP APPL 2/3 (GAUZE/BANDAGES/DRESSINGS) IMPLANT
BLADE CLIPPER SURG (BLADE) ×2 IMPLANT
BLADE ULTRA TIP 2M (BLADE) ×2 IMPLANT
BRUSH SCRUB EZ 1% IODOPHOR (MISCELLANEOUS) IMPLANT
BUR ACORN 6.0 PRECISION (BURR) IMPLANT
BUR ACRON 5.0MM COATED (BURR) ×2 IMPLANT
BUR MATCHSTICK NEURO 3.0 LAGG (BURR) ×2 IMPLANT
CANISTER SUCT 3000ML (MISCELLANEOUS) ×2 IMPLANT
CLIP TI MEDIUM 6 (CLIP) ×4 IMPLANT
CONT SPEC 4OZ CLIKSEAL STRL BL (MISCELLANEOUS) ×4 IMPLANT
CORDS BIPOLAR (ELECTRODE) ×2 IMPLANT
COVER BACK TABLE 60X90IN (DRAPES) ×2 IMPLANT
COVER MAYO STAND STRL (DRAPES) ×2 IMPLANT
DRAPE LAPAROTOMY 100X72 PEDS (DRAPES) ×2 IMPLANT
DRAPE MICROSCOPE LEICA (MISCELLANEOUS) ×2 IMPLANT
DRAPE WARM FLUID 44X44 (DRAPE) IMPLANT
DRSG EMULSION OIL 3X3 NADH (GAUZE/BANDAGES/DRESSINGS) ×2 IMPLANT
DURAGUARD 04CMX04CM ×2 IMPLANT
DURASEAL APPLICATOR TIP (TIP) ×4 IMPLANT
DURASEAL SPINE SEALANT 3ML (MISCELLANEOUS) ×4 IMPLANT
ELECT CAUTERY BLADE 6.4 (BLADE) IMPLANT
ELECT REM PT RETURN 9FT ADLT (ELECTROSURGICAL) ×2
ELECTRODE REM PT RTRN 9FT ADLT (ELECTROSURGICAL) ×1 IMPLANT
GAUZE SPONGE 4X4 12PLY STRL (GAUZE/BANDAGES/DRESSINGS) ×2 IMPLANT
GAUZE SPONGE 4X4 16PLY XRAY LF (GAUZE/BANDAGES/DRESSINGS) IMPLANT
GLOVE BIOGEL PI IND STRL 7.5 (GLOVE) ×1 IMPLANT
GLOVE BIOGEL PI IND STRL 8 (GLOVE) ×4 IMPLANT
GLOVE BIOGEL PI INDICATOR 7.5 (GLOVE) ×1
GLOVE BIOGEL PI INDICATOR 8 (GLOVE) ×4
GLOVE ECLIPSE 7.5 STRL STRAW (GLOVE) ×10 IMPLANT
GLOVE ECLIPSE 8.0 STRL XLNG CF (GLOVE) ×2 IMPLANT
GLOVE EXAM NITRILE LRG STRL (GLOVE) IMPLANT
GLOVE EXAM NITRILE MD LF STRL (GLOVE) IMPLANT
GLOVE EXAM NITRILE XL STR (GLOVE) IMPLANT
GLOVE EXAM NITRILE XS STR PU (GLOVE) IMPLANT
GOWN STRL REUS W/ TWL LRG LVL3 (GOWN DISPOSABLE) ×1 IMPLANT
GOWN STRL REUS W/ TWL XL LVL3 (GOWN DISPOSABLE) ×2 IMPLANT
GOWN STRL REUS W/TWL 2XL LVL3 (GOWN DISPOSABLE) ×4 IMPLANT
GOWN STRL REUS W/TWL LRG LVL3 (GOWN DISPOSABLE) ×2
GOWN STRL REUS W/TWL XL LVL3 (GOWN DISPOSABLE) ×4
KIT BASIN OR (CUSTOM PROCEDURE TRAY) ×2 IMPLANT
KIT ROOM TURNOVER OR (KITS) ×2 IMPLANT
LIQUID BAND (GAUZE/BANDAGES/DRESSINGS) ×4 IMPLANT
NEEDLE SPNL 22GX3.5 QUINCKE BK (NEEDLE) ×2 IMPLANT
NS IRRIG 1000ML POUR BTL (IV SOLUTION) ×8 IMPLANT
PACK CRANIOTOMY (CUSTOM PROCEDURE TRAY) ×2 IMPLANT
PAD ARMBOARD 7.5X6 YLW CONV (MISCELLANEOUS) ×6 IMPLANT
PATTIES SURGICAL 1/4 X 3 (GAUZE/BANDAGES/DRESSINGS) IMPLANT
RUBBERBAND STERILE (MISCELLANEOUS) ×4 IMPLANT
SPONGE LAP 4X18 X RAY DECT (DISPOSABLE) ×2 IMPLANT
STAPLER SKIN PROX WIDE 3.9 (STAPLE) ×2 IMPLANT
STRIP CLOSURE SKIN 1/4X4 (GAUZE/BANDAGES/DRESSINGS) IMPLANT
SUT ETHILON 3 0 FSL (SUTURE) IMPLANT
SUT NURALON 4 0 TR CR/8 (SUTURE) ×2 IMPLANT
SUT PROLENE 5 0 C1 (SUTURE) ×14 IMPLANT
SUT PROLENE 6 0 BV (SUTURE) ×8 IMPLANT
SUT VIC AB 0 CT1 18XCR BRD8 (SUTURE) ×1 IMPLANT
SUT VIC AB 0 CT1 8-18 (SUTURE) ×2
SUT VIC AB 2-0 CP2 18 (SUTURE) ×4 IMPLANT
SUT VIC AB 3-0 SH 8-18 (SUTURE) ×4 IMPLANT
SYR 20ML ECCENTRIC (SYRINGE) ×2 IMPLANT
SYR CONTROL 10ML LL (SYRINGE) ×2 IMPLANT
TAPE CLOTH SURG 4X10 WHT LF (GAUZE/BANDAGES/DRESSINGS) ×2 IMPLANT
TOWEL OR 17X24 6PK STRL BLUE (TOWEL DISPOSABLE) IMPLANT
TOWEL OR 17X26 10 PK STRL BLUE (TOWEL DISPOSABLE) ×2 IMPLANT
TRAY FOLEY CATH 14FRSI W/METER (CATHETERS) ×2 IMPLANT
UNDERPAD 30X30 INCONTINENT (UNDERPADS AND DIAPERS) IMPLANT
WATER STERILE IRR 1000ML POUR (IV SOLUTION) ×2 IMPLANT

## 2014-05-10 NOTE — Progress Notes (Signed)
eLink Physician-Brief Progress Note Patient Name: [name] [name] DOB: 1960/11/06 MRN: [id number]   Date of Service  05/10/2014  HPI/Events of Note  53 yo female with Chiari malformation with headaches.  She had Suboccipital craniectomy, C1 laminectomy, superior C2 laminectomy, and duraplasty with dura guard, with microdissection, microsurgical technique, and the operating microscope.  Hemodynamics stable after procedure.  eICU Interventions  No elink interventions at this time.        Macie Baum 05/10/2014, 7:07 PM

## 2014-05-10 NOTE — Transfer of Care (Signed)
Immediate Anesthesia Transfer of Care Note  Patient: Elizah Lydon  Procedure(s) Performed: Procedure(s) with comments: SUBOCCIPITAL CRANIECTOMY CERVICAL LAMINECTOMY/DURAPLASTY (N/A) - suboccipital craniectomy with cervical laminectomy and duraplasty  Patient Location: PACU  Anesthesia Type:General  Level of Consciousness: awake, alert  and oriented  Airway & Oxygen Therapy: Patient Spontanous Breathing and Patient connected to face mask oxygen  Post-op Assessment: Report given to PACU RN  Post vital signs: Reviewed and stable  Complications: No apparent anesthesia complications

## 2014-05-10 NOTE — Anesthesia Preprocedure Evaluation (Addendum)
Anesthesia Evaluation  Patient identified by MRN, date of birth, ID band Patient awake    Reviewed: Allergy & Precautions, H&P , NPO status , Patient's Chart, lab work & pertinent test results, reviewed documented beta blocker date and time   Airway        Dental   Pulmonary former smoker,          Cardiovascular hypertension, + dysrhythmias + Valvular Problems/Murmurs     Neuro/Psych  Headaches, PSYCHIATRIC DISORDERS Anxiety Depression    GI/Hepatic GERD-  ,  Endo/Other    Renal/GU      Musculoskeletal  (+) Arthritis -,   Abdominal   Peds  Hematology  (+) anemia ,   Anesthesia Other Findings Hx Breast CA  Reproductive/Obstetrics                            Anesthesia Physical Anesthesia Plan  ASA: III  Anesthesia Plan: General   Post-op Pain Management:    Induction: Intravenous  Airway Management Planned: Oral ETT  Additional Equipment: Arterial line  Intra-op Plan:   Post-operative Plan: Extubation in OR and Possible Post-op intubation/ventilation  Informed Consent: I have reviewed the patients History and Physical, chart, labs and discussed the procedure including the risks, benefits and alternatives for the proposed anesthesia with the patient or authorized representative who has indicated his/her understanding and acceptance.   Dental advisory given  Plan Discussed with: CRNA, Anesthesiologist and Surgeon  Anesthesia Plan Comments:         Anesthesia Quick Evaluation

## 2014-05-10 NOTE — H&P (Signed)
Subjective: Patient is a 53 y.o.right-handed black female who is admitted for he compression of a Chiari malformation.  Patient has had a history of headaches throughout her life, but they've been worse over the past 2 months. They're typically retro-orbital, but with straining, such as when she is constipated or with vigorous laughing, she'll get tremendous pain in the posterior craniocervical junction. She was evaluated with MRI that revealed a moderate Chiari malformation, with impaction of the cerebellar tonsils into the upper cervical spinal canal. There is no evidence of syringomyelia. Symptomatically she also describes blurred vision, which has been difficult to refract. She is admitted now for a suboccipital craniectomy, upper cervical laminectomy, and duraplasty.   Patient Active Problem List   Diagnosis Date Noted  . Joint pain 10/02/2013  . Neuropathic pain 10/02/2013  . Onychomycosis of toenail 10/02/2013  . Frequent headaches 06/19/2013  . Anxiety as acute reaction to exceptional stress 05/22/2013  . Breast cancer of upper-outer quadrant of left female breast 04/06/2013  . 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 Obesity (BMI 30-39.9) 12/14/2012  . GERD (gastroesophageal reflux disease) 12/14/2012  . Chronic depression 12/14/2012  . Heart murmur, systolic 16/04/9603  . Essential hypertension 09/02/2012  . Chest pain   . Palpitations   . DEGENERATIVE JOINT DISEASE, RIGHT KNEE 10/03/2009  . [place] DEGENERATION 10/24/2007   Past Medical History  Diagnosis Date  . Chest pain 2009    Consultation-[facility], negative chest CT; nl echo in 2005; h/o palpitations  . Palpitations   . Degenerative joint disease     + degenerative joint disease of the lumbosacral spine  . Colitis 2010    not IBD  . Herpes simplex type II infection   . Allergic rhinitis   . Hypercholesteremia     "slightly high"  . Anxiety   . Heart murmur     "small"  . GERD (gastroesophageal reflux disease)      "a little"  . Depression   . Anemia, unspecified 05/30/2013  . Wears glasses   . Breast cancer 03/31/13    left  . Cancer   . Dysrhythmia     palpations  . Hypertension     does not have high blood pressure  . Headache     Past Surgical History  Procedure Laterality Date  . Colonoscopy  10/2010    proctitis; melanosis coli  . Tubal ligation    . Breast excisional biopsy  04/2003    Left; benign disease  . Right oophorectomy  03/13/2007  . Abdominal hysterectomy  03/13/2007    TAH ?BSO--Dr Levin Bacon, [place]  . Eye surgery Left     "fix lazy eye"  . Portacath placement Right 04/21/2013    Procedure: INSERTION PORT-A-CATH;  Surgeon: [name], MD;  Location: [facility];  Service: General;  Laterality: Right;  . Breast lumpectomy with needle localization and axillary lymph node dissection Left 09/12/2013    Procedure: BREAST LUMPECTOMY WITH NEEDLE LOCALIZATION AND AXILLARY LYMPH NODE DISSECTION;  Surgeon: [name], MD;  Location: [facility];  Service: General;  Laterality: Left;  and axilla  . Breast surgery      Prescriptions prior to admission  Medication Sig Dispense Refill Last Dose  . acetaminophen (TYLENOL) 500 MG tablet Take 500 mg by mouth every 6 (six) hours as needed.   05/09/2014 at Unknown time  . ALPRAZolam (XANAX) 1 MG tablet Take 1 mg by mouth at bedtime.   05/09/2014 at Unknown time  . anastrozole (  ARIMIDEX) 1 MG tablet Take 1 tablet (1 mg total) by mouth daily. 90 tablet 4 05/09/2014 at Unknown time  . atenolol (TENORMIN) 50 MG tablet TAKE 1 TABLET BY MOUTH DAILY 90 tablet 0 05/09/2014 at Unknown time  . Cholecalciferol (VITAMIN D PO) Take 1 tablet by mouth daily.   05/09/2014 at Unknown time  . citalopram (CELEXA) 40 MG tablet TAKE 1 TABLET BY MOUTH EVERY DAY 90 tablet 0 05/09/2014 at Unknown time  . diphenhydramine-acetaminophen (TYLENOL PM) 25-500 MG TABS Take 2 tablets by mouth at bedtime.   05/09/2014 at Unknown time  . pantoprazole (PROTONIX)  40 MG tablet Take 1 tablet (40 mg total) by mouth daily. 90 tablet 1 05/09/2014 at Unknown time  . sucralfate (CARAFATE) 1 G tablet Take 1 tablet (1 g total) by mouth 4 (four) times daily -  with meals and at bedtime. Prn reflux 40 tablet 0 05/09/2014 at Unknown time  . sucralfate (CARAFATE) 1 G tablet Take 1 g by mouth 4 (four) times daily as needed (for reflux).   05/09/2014 at Unknown time  . valACYclovir (VALTREX) 1000 MG tablet Take 1,000 mg by mouth daily.   05/09/2014 at Unknown time  . celecoxib (CELEBREX) 200 MG capsule Take 200 mg by mouth daily as needed for mild pain.    More than a month at Unknown time   Allergies  Allergen Reactions  . Doxycycline Other (See Comments)    Unknown    History  Substance Use Topics  . Smoking status: Former Smoker -- 0.50 packs/day for 15 years    Types: Cigarettes    Quit date: 07/06/1982  . Smokeless tobacco: Never Used  . Alcohol Use: No    Family History  Problem Relation Age of Onset  . Aneurysm Mother     Cerebral  . Hypertension Mother   . Hyperlipidemia Mother   . Stroke Mother   . Coronary artery disease Father   . Hypertension Father   . Hyperlipidemia Father   . Lung cancer Brother   . Cancer Brother     Lung     Review of Systems A comprehensive review of systems was negative.  Objective: Vital signs in last 24 hours: Temp:  [98.3 F (36.8 C)] 98.3 F (36.8 C) (11/05 0842) Pulse Rate:  [62] 62 (11/05 0842) Resp:  [20] 20 (11/05 0842) BP: (149)/(75) 149/75 mmHg (11/05 0842) SpO2:  [100 %] 100 % (11/05 0842) Weight:  [75.751 kg (167 lb)] 75.751 kg (167 lb) (11/05 0842)  EXAM:  Patient is a well-developed well-nourished black female in no acute distress. Lungs are clear to auscultation , the patient has symmetrical respiratory excursion. Heart has a regular rate and rhythm normal S1 and S2 no murmur.   Abdomen is soft nontender nondistended bowel sounds are present. Extremity examination shows no clubbing cyanosis or  edema. Neurologic examination shows a mental status but is intact: The patient is awake, alert, and fully oriented. Speech is fluent. She has good comprehension. Cranial nerves show pupils are equal, round, and reactive to light. EOMI. Facial sensation intact, facial movements symmetrical. Hearing present bilaterally, palatal movements symmetrical. Shoulder shrug symmetrical. Tongue is midline.  Motor examination shows 5/5 strength to the upper and lower extremities. There is no drift of the upper extremities. Sensation is intact to pinprick in the distal upper and lower extremities. Reflexes are symmetrical in the upper and lower extremities. Toes are downgoing bilaterally. She has a normal gait and stance.   Data Review:CBC  Component Value Date/Time   WBC 7.3 05/08/2014 1514   WBC 7.0 04/13/2014 0752   RBC 3.92 05/08/2014 1514   RBC 3.84 04/13/2014 0752   HGB 11.5* 05/08/2014 1514   HGB 11.3* 04/13/2014 0752   HCT 33.9* 05/08/2014 1514   HCT 34.5* 04/13/2014 0752   PLT 183 05/08/2014 1514   PLT 183 04/13/2014 0752   MCV 86.5 05/08/2014 1514   MCV 90.0 04/13/2014 0752   MCH 29.3 05/08/2014 1514   MCH 29.5 04/13/2014 0752   MCHC 33.9 05/08/2014 1514   MCHC 32.7 04/13/2014 0752   RDW 13.7 05/08/2014 1514   RDW 14.7* 04/13/2014 0752   LYMPHSABS 1.3 04/13/2014 0752   LYMPHSABS 3.0 06/06/2008 0545   MONOABS 0.6 04/13/2014 0752   MONOABS 0.7 06/06/2008 0545   EOSABS 0.1 04/13/2014 0752   EOSABS 0.1 06/06/2008 0545   BASOSABS 0.0 04/13/2014 0752   BASOSABS 0.0 06/06/2008 0545                          BMET    Component Value Date/Time   NA 142 05/08/2014 1514   NA 143 04/13/2014 0752   K 4.4 05/08/2014 1514   K 4.0 04/13/2014 0752   CL 101 05/08/2014 1514   CO2 26 05/08/2014 1514   CO2 27 04/13/2014 0752   GLUCOSE 83 05/08/2014 1514   GLUCOSE 87 04/13/2014 0752   BUN 14 05/08/2014 1514   BUN 13.9 04/13/2014 0752   CREATININE 1.00 05/08/2014 1514   CREATININE 1.0  04/13/2014 0752   CALCIUM 9.2 05/08/2014 1514   CALCIUM 9.7 04/13/2014 0752   GFRNONAA 63* 05/08/2014 1514   GFRAA 73* 05/08/2014 1514     Assessment/Plan: Patient with a Chiari malformation, admitted for decompression including suboccipital craniectomy and upper cervical laminectomy, as well as a duraplasty. I discussed nature of her condition and the nature the surgical procedure, using her imaging and a mildly office. We discussed typical of the surgery, hospital stay, and overall recuperation. The need for initial care in the intensive care unit postoperatively, and the uncertainty of clinical improvement. We discussed risks of surgery including risks of infection, bleeding, possibly need for transfusion, the risk of neurologic dysfunction including paralysis, coma, and death  We discussed the risk of CSF leakage and possibly for further surgery, and anesthetic risks of myocardial infarction, stroke, pneumonia, and death. Understanding all this the patient does wish to proceed with surgery and is admitted for such.   [name], MD 05/10/2014 10:36 AM

## 2014-05-10 NOTE — Op Note (Signed)
05/10/2014  4:05 PM  PATIENT:  [name] [name]  53 y.o. female  PRE-OPERATIVE DIAGNOSIS:  chiari malformation type 1  POST-OPERATIVE DIAGNOSIS:  chiari malformation type 1  PROCEDURE:  Procedure(s):  Suboccipital craniectomy, C1 laminectomy, superior C2 laminectomy, and duraplasty with dura guard, with microdissection, microsurgical technique, and the operating microscope  SURGEON:  Surgeon(s): [name], MD Faythe Ghee, MD  ASSISTANTS:  Karie Chimera, M.D.  ANESTHESIA:   general  EBL:  Total I/O In: 2000 [I.V.:2000] Out: 725 [Urine:525; Blood:200]  BLOOD ADMINISTERED:none  COUNT:  correct per nursing staff  DICTATION:  Patient was brought to the operating room, placed under general endotracheal anesthesia. Patient was placed in a 3 pin Mayfield head holder, and turned to a prone position.The occipital scalp was shaved, and the occipital scalp and posterior neck were prepped with Betadine soap and solution and draped in a sterile fashion. The midline was infiltrated with local anesthetic with epinephrine, and a midline incision was made, carried down through the subcutaneous tissue, bipolar cautery and electrocautery were used to maintain hemostasis. The midline posterior fascia was incised bilaterally.  Dissection was carried down through the midline to the occipital bone as well as the lamina of C1 and the spinous process and lamina of C2.  A suboccipital craniectomy was performed using the high-speed drill, Kerrison punches, and double-action rongeurs. A C1 and superior C2 posterior cervical laminectomy was performed using the high-speed drill and Kerrison punches. The operating microscope was draped, and brought in the field, to provide additional magnification, illumination, and visualization. The dura was opened in the midline over the upper cervical region, and the dural opening was extended to the foramen magnum, and then opened in a Y-shaped fashion extending superior  laterally both to the left and the right. There was moderate bleeding from a prominent occipital sinus, that was controlled with medium hemoclips and cautery. Once the dura was fully opened, we cut a piece of Dura-Guard to a triangular shape to be used as a dural patch for the duraplasty. The dural graft was secured in each of the three corners with 5-0 Prolene sutures and then each line of the dural graft was sutured to the dura with running 5-0 Prolene suture. Once the dural closure was completed, DuraSeal was applied to the dural closure. We then proceeded with a multilevel closure. Deep fascia was closed with interrupted undyed 0 Vicryl sutures, Scarpa's fascia was closed with interrupted undyed 0 Vicryl sutures, and the subcutaneous and subcuticular closed with interrupted inverted 2-0 undyed Vicryl suture.  Skin edges were closed with surgical staples.  The wound was dressed with Adaptic and sterile gauze, and Hypafix. Following surgery the patient was turned back to supine position, the 3 pin Mayfield headholder was removed, she is to be reversed from the anesthetic, extubated, and transferred to the recovery room for further care.  PLAN OF CARE: Admit to inpatient   PATIENT DISPOSITION:  PACU - hemodynamically stable.   Delay start of Pharmacological VTE agent (>24hrs) due to surgical blood loss or risk of bleeding:  yes

## 2014-05-10 NOTE — Progress Notes (Signed)
Subjective: Patient resting in bed, moderate incisional pain. Denies nausea, no vomiting.  Objective: Vital signs in last 24 hours: Filed Vitals:   05/10/14 1745 05/10/14 1800 05/10/14 1805 05/10/14 1900  BP:   144/63 148/72  Pulse: 76  91 74  Temp:      TempSrc:      Resp: 15  12 12   Height:  5\' 1"  (1.549 m)    Weight:  78.4 kg (172 lb 13.5 oz)    SpO2: 100%  100% 100%    Intake/Output from previous day:   Intake/Output this shift:    Physical Exam:  Awake and alert, oriented. Moving all 4 extremities well. Dressing clean and dry.  CBC  Recent Labs  05/08/14 1514  WBC 7.3  HGB 11.5*  HCT 33.9*  PLT 183   BMET  Recent Labs  05/08/14 1514  NA 142  K 4.4  CL 101  CO2 26  GLUCOSE 83  BUN 14  CREATININE 1.00  CALCIUM 9.2    Assessment/Plan: Stable following Chiari malformation decompression.  A line no longer functional, and we'll have staff remove.  Will add Flexeril by mouth to see if that helps the pain.  We'll allow sips with meds.   [name], MD 05/10/2014, 7:46 PM

## 2014-05-11 ENCOUNTER — Encounter (HOSPITAL_COMMUNITY): Payer: Self-pay | Admitting: Neurosurgery

## 2014-05-11 MED ORDER — LORAZEPAM 2 MG/ML IJ SOLN
INTRAMUSCULAR | Status: AC
  Administered 2014-05-11: 2 mg
  Filled 2014-05-11: qty 1

## 2014-05-11 MED ORDER — VALACYCLOVIR HCL 500 MG PO TABS
1000.0000 mg | ORAL_TABLET | Freq: Every day | ORAL | Status: DC
  Administered 2014-05-11 – 2014-05-14 (×4): 1000 mg via ORAL
  Filled 2014-05-11 (×5): qty 2

## 2014-05-11 MED ORDER — LORAZEPAM 2 MG/ML IJ SOLN
2.0000 mg | Freq: Once | INTRAMUSCULAR | Status: DC
  Filled 2014-05-11 (×2): qty 1

## 2014-05-11 MED ORDER — OXYCODONE-ACETAMINOPHEN 5-325 MG PO TABS
1.0000 | ORAL_TABLET | ORAL | Status: DC | PRN
  Administered 2014-05-11: 1 via ORAL
  Administered 2014-05-11 – 2014-05-13 (×6): 2 via ORAL
  Filled 2014-05-11 (×6): qty 2
  Filled 2014-05-11: qty 1

## 2014-05-11 MED ORDER — PANTOPRAZOLE SODIUM 40 MG PO TBEC
40.0000 mg | DELAYED_RELEASE_TABLET | Freq: Every day | ORAL | Status: DC
  Administered 2014-05-12 – 2014-05-14 (×3): 40 mg via ORAL
  Filled 2014-05-11 (×4): qty 1

## 2014-05-11 MED ORDER — CITALOPRAM HYDROBROMIDE 40 MG PO TABS
40.0000 mg | ORAL_TABLET | Freq: Every day | ORAL | Status: DC
  Administered 2014-05-11 – 2014-05-14 (×4): 40 mg via ORAL
  Filled 2014-05-11 (×5): qty 1

## 2014-05-11 MED ORDER — ATENOLOL 25 MG PO TABS
50.0000 mg | ORAL_TABLET | Freq: Every day | ORAL | Status: DC
  Administered 2014-05-11 – 2014-05-14 (×4): 50 mg via ORAL
  Filled 2014-05-11: qty 1
  Filled 2014-05-11: qty 2
  Filled 2014-05-11: qty 1
  Filled 2014-05-11 (×2): qty 2

## 2014-05-11 MED ORDER — MORPHINE SULFATE 2 MG/ML IJ SOLN
2.0000 mg | Freq: Once | INTRAMUSCULAR | Status: DC
  Filled 2014-05-11: qty 1

## 2014-05-11 NOTE — Plan of Care (Signed)
Problem: Phase I Progression Outcomes Goal: Respiratory status stable Outcome: Completed/Met Date Met:  05/11/14

## 2014-05-11 NOTE — Anesthesia Postprocedure Evaluation (Signed)
  Anesthesia Post-op Note  Patient: [name] [name]  Procedure(s) Performed: Procedure(s) with comments: SUBOCCIPITAL CRANIECTOMY CERVICAL LAMINECTOMY/DURAPLASTY (N/A) - suboccipital craniectomy with cervical laminectomy and duraplasty  Patient Location: PACU  Anesthesia Type:General  Level of Consciousness: awake, oriented, sedated and patient cooperative  Airway and Oxygen Therapy: Patient Spontanous Breathing  Post-op Pain: mild  Post-op Assessment: Post-op Vital signs reviewed, Patient's Cardiovascular Status Stable, Respiratory Function Stable, Patent Airway, No signs of Nausea or vomiting and Pain level controlled  Post-op Vital Signs: stable  Last Vitals:  Filed Vitals:   05/11/14 0600  BP: 126/68  Pulse: 72  Temp:   Resp: 8    Complications: No apparent anesthesia complications

## 2014-05-11 NOTE — Progress Notes (Signed)
Subjective: Patient resting in bed, more comfortable. No nausea, tolerated sips with meds well.  Objective: Vital signs in last 24 hours: Filed Vitals:   05/11/14 0400 05/11/14 0500 05/11/14 0600 05/11/14 0700  BP: 118/65 121/59 126/68 123/68  Pulse: 75 73 72 72  Temp:      TempSrc:      Resp: 19 14 8 19   Height:      Weight:      SpO2: 89% 91% 93% 92%    Intake/Output from previous day: 11/05 0701 - 11/06 0700 In: 3851.7 [I.V.:3851.7] Out: 2720 [Urine:2520; Blood:200] Intake/Output this shift:    Physical Exam:  Awake alert, oriented. Moving all extremities well. Dressing clean and dry.  CBC  Recent Labs  05/08/14 1514  WBC 7.3  HGB 11.5*  HCT 33.9*  PLT 183   BMET  Recent Labs  05/08/14 1514  NA 142  K 4.4  CL 101  CO2 26  GLUCOSE 83  BUN 14  CREATININE 1.00  CALCIUM 9.2    Assessment/Plan: We'll DC Foley catheter, begin out of bed to chair, and advanced to ambulate in ICU. Will advance to regular diet, and once taking well by mouth, we'll change IV to saline lock.   [name], MD 05/11/2014, 7:27 AM

## 2014-05-11 NOTE — Plan of Care (Signed)
Problem: Phase I Progression Outcomes Goal: Hemodynamically stable Outcome: Completed/Met Date Met:  05/11/14     

## 2014-05-11 NOTE — Progress Notes (Signed)
Pts occipital dressing is saturated with clear fluid. Pillowcase has halo. MDs on call notified. They will come check incision when they get out of the OR.

## 2014-05-11 NOTE — Plan of Care (Signed)
Problem: Phase I Progression Outcomes Goal: Voiding-avoid urinary catheter unless indicated Outcome: Completed/Met Date Met:  05/11/14     

## 2014-05-11 NOTE — Progress Notes (Signed)
UR completed.  Jashley Yellin, RN BSN [facility] CCM Trauma/Neuro ICU Case Manager [phone]  

## 2014-05-11 NOTE — Plan of Care (Signed)
Problem: Consults Goal: Craniotomy Patient Education See Patient Education Module for education specifics. Outcome: Progressing

## 2014-05-12 MED ORDER — DEXAMETHASONE 4 MG PO TABS
4.0000 mg | ORAL_TABLET | Freq: Once | ORAL | Status: AC
  Administered 2014-05-12: 4 mg via ORAL
  Filled 2014-05-12: qty 1

## 2014-05-12 MED ORDER — DEXAMETHASONE 4 MG PO TABS
4.0000 mg | ORAL_TABLET | Freq: Three times a day (TID) | ORAL | Status: DC
  Administered 2014-05-12 – 2014-05-14 (×5): 4 mg via ORAL
  Filled 2014-05-12 (×5): qty 1

## 2014-05-12 NOTE — Progress Notes (Signed)
Patient received to room 4N20 in [facility] transported by Margaretha Sheffield RN and accompanied by several family members. Patient and family oriented to safety plan, use of call system and phone. Patient ambulated steadily from [facility] to chair. Patient denies pain or other needs at this time.

## 2014-05-12 NOTE — Progress Notes (Signed)
Postop day 2. Patient with fluid drainage from inferior aspect of her wound which was oversewn last night. No further wound drainage. Patient denies headache this morning. No other problems.  She is afebrile. Vitals are stable. Tolerating regular diet.  Awake and alert. Oriented and appropriate. Motor and sensory function intact. Wound clean and dry currently.  Progressing well. We'll transfer to floor. Watch for evidence of renewed wound drainage.

## 2014-05-12 NOTE — Plan of Care (Signed)
Problem: Consults Goal: Craniotomy Patient Education See Patient Education Module for education specifics.  Outcome: Completed/Met Date Met:  05/12/14 Goal: Diagnosis - Craniotomy Chiari malformation Goal: Skin Care Protocol Initiated - if Braden Score 18 or less If consults are not indicated, leave blank or document N/A  Outcome: Not Applicable Date Met:  72/76/18 Goal: Nutrition Consult-if indicated Outcome: Not Applicable Date Met:  48/59/27 Goal: Diabetes Guidelines if Diabetic/Glucose > 140 If diabetic or lab glucose is > 140 mg/dl - Initiate Diabetes/Hyperglycemia Guidelines & Document Interventions  Outcome: Not Applicable Date Met:  63/94/32  Problem: Phase I Progression Outcomes Goal: Neuro exam at baseline or improved Outcome: Completed/Met Date Met:  05/12/14 Goal: Pain controlled with appropriate interventions Outcome: Completed/Met Date Met:  05/12/14 Goal: Bedrest HOB at 30 degrees Outcome: Completed/Met Date Met:  05/12/14 Goal: Initial discharge plan identified Outcome: Completed/Met Date Met:  05/12/14 Goal: Other Phase I Outcomes/Goals Outcome: Completed/Met Date Met:  05/12/14  Problem: Phase II Progression Outcomes Goal: Neuro exam at baseline or improved Outcome: Completed/Met Date Met:  05/12/14 Goal: Extubated and maintains O2 sats > 92% Outcome: Completed/Met Date Met:  05/12/14 Goal: Hemodynamically stable Outcome: Completed/Met Date Met:  05/12/14 Goal: Pain controlled Outcome: Completed/Met Date Met:  05/12/14 Goal: Progress activity as tolerated unless otherwise ordered Outcome: Completed/Met Date Met:  05/12/14 Goal: Discharge plan established Outcome: Completed/Met Date Met:  05/12/14 Goal: Tolerating diet Outcome: Completed/Met Date Met:  05/12/14 Goal: Other Phase II Outcomes/Goals Outcome: Completed/Met Date Met:  05/12/14

## 2014-05-13 MED ORDER — POLYETHYLENE GLYCOL 3350 17 G PO PACK
17.0000 g | PACK | Freq: Every day | ORAL | Status: DC
  Administered 2014-05-13 – 2014-05-15 (×3): 17 g via ORAL
  Filled 2014-05-13 (×3): qty 1

## 2014-05-13 MED ORDER — KETOROLAC TROMETHAMINE 30 MG/ML IJ SOLN
30.0000 mg | Freq: Four times a day (QID) | INTRAMUSCULAR | Status: DC
  Administered 2014-05-13 – 2014-05-15 (×8): 30 mg via INTRAVENOUS
  Filled 2014-05-13 (×9): qty 1

## 2014-05-13 NOTE — Progress Notes (Signed)
Feeling some worse today. More headache more neck stiffness today. No fevers. No wound drainage. This complains of feeling achy all over.  Afebrile. Vitals are stable. Wound healing well. No evidence of any drainage or discharge. Motor and sensory function intact.  Overall doing a little worse today.we'll add Toradol the hopefully help with some of the postop inflammation and soreness. Continue efforts at mobilization.

## 2014-05-14 LAB — TYPE AND SCREEN
ABO/RH(D): O POS
Antibody Screen: POSITIVE
DAT, IgG: NEGATIVE
Donor AG Type: NEGATIVE
Donor AG Type: NEGATIVE
PT AG Type: NEGATIVE
Unit division: 0
Unit division: 0

## 2014-05-14 MED ORDER — DEXAMETHASONE 4 MG PO TABS
4.0000 mg | ORAL_TABLET | Freq: Two times a day (BID) | ORAL | Status: DC
  Administered 2014-05-14 – 2014-05-15 (×2): 4 mg via ORAL
  Filled 2014-05-14 (×2): qty 1

## 2014-05-14 NOTE — Progress Notes (Signed)
Patient ambulated 2 laps around the unit. Cori Razor, RN

## 2014-05-14 NOTE — Progress Notes (Addendum)
Patient ambulated 1 lap around the unit, tolerated well. Cori Razor, RN

## 2014-05-14 NOTE — Plan of Care (Signed)
Problem: Phase III Progression Outcomes Goal: Pain controlled on oral analgesia Outcome: Completed/Met Date Met:  05/14/14

## 2014-05-14 NOTE — Progress Notes (Signed)
Filed Vitals:   05/13/14 2100 05/14/14 0000 05/14/14 0400 05/14/14 0942  BP: 155/80 139/77 144/70 145/65  Pulse: 49 49 52 51  Temp: 98.1 F (36.7 C) 97.9 F (36.6 C) 97.7 F (36.5 C) 97.7 F (36.5 C)  TempSrc: Oral Oral Oral Oral  Resp: 18 18 17 16   Height:      Weight:      SpO2: 100% 99% 99% 100%    Patient resting in bed, explains that she is more comfortable than she was yesterday. Receiving both Decadron 4 mg by mouth every 8 hours and now Toradol 30 mg IV every 6 hours. Small dressing, at the inferior aspect of the incision, is dry and intact; the remainder the incision is healing nicely; no erythema, swelling, or drainage.    Plan: Will taper Decadron to 4 mg by mouth every 12 hours, and continue Toradol 30 mg IV every 6 hours, and monitor patient's comfort, function, and progress.  Hasn't been up in the hall yet today, encouraged patient that she needs to walk in the hall at least 4-5 times today.  [name], MD 05/14/2014, 11:43 AM

## 2014-05-14 NOTE — Plan of Care (Signed)
Problem: Phase III Progression Outcomes Goal: Neuro exam at baseline or improved Outcome: Completed/Met Date Met:  05/14/14

## 2014-05-15 MED ORDER — OXYCODONE-ACETAMINOPHEN 5-325 MG PO TABS: 1.0000 | ORAL_TABLET | ORAL | Status: DC | PRN

## 2014-05-15 MED ORDER — DEXAMETHASONE 4 MG PO TABS: 4.0000 mg | ORAL_TABLET | Freq: Two times a day (BID) | ORAL | Status: DC

## 2014-05-15 MED ORDER — MELOXICAM 7.5 MG PO TABS: 7.5000 mg | ORAL_TABLET | Freq: Two times a day (BID) | ORAL | Status: DC

## 2014-05-15 MED ORDER — CYCLOBENZAPRINE HCL 10 MG PO TABS: 5.0000 mg | ORAL_TABLET | Freq: Three times a day (TID) | ORAL | Status: DC | PRN

## 2014-05-15 NOTE — Discharge Instructions (Signed)
Taper decadron as follows: Take 1 tablet twice a day on Tues and Wen 11/10 and 11/11 Take 1/2 tablet twice a day on Thurs and Fri 11/12 and 11/13 Take 1/2 tablet in the morning on Sat and Sun 11/14 and 11/15 Take 1/2 tablet on the morning of Tues and Thurs 11/17 and 11/19 Then discontinue Decadron

## 2014-05-15 NOTE — Progress Notes (Signed)
Pt transported out via wheelchair husband present. Cori Razor, RN

## 2014-05-15 NOTE — Discharge Summary (Signed)
Physician Discharge Summary  Patient ID: [name] [name] MRN: [id number] DOB/AGE: 1961/05/26 53 y.o.  Admit date: 05/10/2014 Discharge date: 05/15/2014  Admission Diagnoses:  Chiari I malformation, headaches  Discharge Diagnoses:  Chiari I malformation, headaches  Active Problems:   Chiari I malformation   Discharged Condition: good  Hospital Course:  Patient was admitted, underwent a Chiari decompression, including suboccipital craniectomy, upper cervical laminectomy, and duraplasty. Postoperatively she has done well. She had moderate discomfort initially, that is significantly improved. She did have a small amount of wound drainage on the first postoperative day, and the lower aspect of the incision was oversewn with suture. She is up and ambulating actively in the halls. She is asking to be discharged home. We have tapered her Decadron, and she will continue Decadron taper following discharge. She is to return in 6-7 days for staple and suture removal. We have given her instructions regarding wound care and activities following discharge.  Discharge Exam: Blood pressure 132/67, pulse 58, temperature 97.9 F (36.6 C), temperature source Oral, resp. rate 19, height 5\' 1"  (1.549 m), weight 78.4 kg (172 lb 13.5 oz), last menstrual period 12/04/2008, SpO2 98 %.  Disposition: home     Medication List    TAKE these medications        acetaminophen 500 MG tablet  Commonly known as:  TYLENOL  Take 500 mg by mouth every 6 (six) hours as needed.     ALPRAZolam 1 MG tablet  Commonly known as:  XANAX  Take 1 mg by mouth at bedtime.     anastrozole 1 MG tablet  Commonly known as:  ARIMIDEX  Take 1 tablet (1 mg total) by mouth daily.     atenolol 50 MG tablet  Commonly known as:  TENORMIN  TAKE 1 TABLET BY MOUTH DAILY     celecoxib 200 MG capsule  Commonly known as:  CELEBREX  Take 200 mg by mouth daily as needed for mild pain.     citalopram 40 MG tablet  Commonly known as:   CELEXA  TAKE 1 TABLET BY MOUTH EVERY DAY     cyclobenzaprine 10 MG tablet  Commonly known as:  FLEXERIL  Take 0.5-1 tablets (5-10 mg total) by mouth 3 (three) times daily as needed for muscle spasms.     dexamethasone 4 MG tablet  Commonly known as:  DECADRON  Take 1 tablet (4 mg total) by mouth every 12 (twelve) hours.     diphenhydramine-acetaminophen 25-500 MG Tabs  Commonly known as:  TYLENOL PM  Take 2 tablets by mouth at bedtime.     meloxicam 7.5 MG tablet  Commonly known as:  MOBIC  Take 1 tablet (7.5 mg total) by mouth 2 (two) times daily.     oxyCODONE-acetaminophen 5-325 MG per tablet  Commonly known as:  PERCOCET/ROXICET  Take 1-2 tablets by mouth every 4 (four) hours as needed (pain).     pantoprazole 40 MG tablet  Commonly known as:  PROTONIX  Take 1 tablet (40 mg total) by mouth daily.     sucralfate 1 G tablet  Commonly known as:  CARAFATE  Take 1 g by mouth 4 (four) times daily as needed (for reflux).     sucralfate 1 G tablet  Commonly known as:  CARAFATE  Take 1 tablet (1 g total) by mouth 4 (four) times daily -  with meals and at bedtime. Prn reflux     valACYclovir 1000 MG tablet  Commonly known as:  VALTREX  Take  1,000 mg by mouth daily.     VITAMIN D PO  Take 1 tablet by mouth daily.         SignedHosie Spangle, MD 05/15/2014, 9:02 AM

## 2014-05-15 NOTE — Progress Notes (Signed)
Discharge orders received.  Discharge instructions and follow-up appointments reviewed with the patient.  IV removed and education complete. Awaiting husband for pick-up.  Will continue to monitor. Cori Razor, RN

## 2014-05-30 ENCOUNTER — Telehealth: Payer: Self-pay | Admitting: Adult Health

## 2014-05-30 NOTE — Telephone Encounter (Signed)
, °

## 2014-06-04 ENCOUNTER — Encounter (HOSPITAL_COMMUNITY): Payer: Self-pay

## 2014-06-04 ENCOUNTER — Ambulatory Visit (HOSPITAL_COMMUNITY)
Admission: RE | Admit: 2014-06-04 | Discharge: 2014-06-04 | Disposition: A | Discharge status: Home / Self Care | Payer: BC Managed Care – PPO | Source: Ambulatory Visit | Attending: Family Medicine | Admitting: Family Medicine

## 2014-06-04 ENCOUNTER — Ambulatory Visit (HOSPITAL_BASED_OUTPATIENT_CLINIC_OR_DEPARTMENT_OTHER)
Admission: RE | Admit: 2014-06-04 | Discharge: 2014-06-04 | Disposition: A | Discharge status: Home / Self Care | Payer: BC Managed Care – PPO | Source: Ambulatory Visit | Attending: Internal Medicine | Admitting: Internal Medicine

## 2014-06-04 VITALS — BP 104/52 | HR 67 | Wt 171.8 lb

## 2014-06-04 DIAGNOSIS — C50912 Malignant neoplasm of unspecified site of left female breast: Secondary | ICD-10-CM | POA: Diagnosis not present

## 2014-06-04 DIAGNOSIS — Z79899 Other long term (current) drug therapy: Secondary | ICD-10-CM | POA: Diagnosis not present

## 2014-06-04 DIAGNOSIS — Z853 Personal history of malignant neoplasm of breast: Secondary | ICD-10-CM | POA: Insufficient documentation

## 2014-06-04 DIAGNOSIS — C50412 Malignant neoplasm of upper-outer quadrant of left female breast: Secondary | ICD-10-CM

## 2014-06-04 DIAGNOSIS — Z87891 Personal history of nicotine dependence: Secondary | ICD-10-CM | POA: Diagnosis not present

## 2014-06-04 DIAGNOSIS — R002 Palpitations: Secondary | ICD-10-CM | POA: Diagnosis not present

## 2014-06-04 DIAGNOSIS — I10 Essential (primary) hypertension: Secondary | ICD-10-CM | POA: Insufficient documentation

## 2014-06-04 DIAGNOSIS — I519 Heart disease, unspecified: Secondary | ICD-10-CM

## 2014-06-04 NOTE — Patient Instructions (Signed)
Follow up as needed

## 2014-06-04 NOTE — Progress Notes (Signed)
Patient ID: [name] [name], female   DOB: 1961-01-25, 53 y.o.   MRN: [id number] Oncologist: Dr Jana Hakim PCP: Rubbie Battiest, MD ; Pearson Forster, NP  GYN: Josefa Half, MD  SU: Alphonsa Overall, MD  [place]: Thea Silversmith, MD  HPI: [name] [name] is a 53 year old with history of L breast cancer diagnosed 03/2013 T2-T3 pN1, stage IIB-IIIA invasive ductal carcinoma, grade 2-3, estrogen receptor 39% positive (with moderate staining intensity), progesterone receptor negative, with an MIB-1 of 83%, and HER-2 amplified by CISH, HTN, Depression, herpes simplex II, DJD, palpitations --on atenolol.   05/2014 S/P Chiari malformation decompression, including suboccipital craniectomy, upper cervical laminectomy, and duraplasty  Started on trastuzumab/ pertuzumab/ carboplatin/docetaxel on 05/01/13. Pertuzumab stopped due to rash.   She returns for follow up. Currently recovering from craniectomy. Completed Herceptin in October 2015.  Still with palpitations. Suppressed with Atenolol.    ECHO : 07/26/13 EF 60-65%  Lateral S'  11.9   GLS -21.7% ECHO:  08/15/13 EF 60-65%  Lateral s' 11.8 cm/s GLS -24.0% ECHO: 10/31/13 EF 60%, Lateral S' 11.4 cm/sec, GLS -75.4%, normal diastolic function ECHO: 3/60/67 EF 60% lateral s' 12.9 cm/s GLS -22.8% ECHO 06/04/14 EF 60% lateral s' 10.7-11.1 (poor window) GLS underestimated due to poor endocardium tracking. 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 Lives with her husband and daughter. Former smoker quit 15 years ago  FH: Mother: cerebral aneurysm, HTN, Hyperlipidemia, CVA,       Father:  Deceased MI at age 100 and died. CAD, HTN, Hyper lipidemia,        Brother: Lung Cancer   PHYSICAL EXAM: Filed Vitals:   06/04/14 0957  BP: 104/52  Pulse: 67   General:  Well appearing. No respiratory difficulty HEENT: normal Neck: supple. no JVD. Carotids 2+ bilat; no bruits. No lymphadenopathy or thryomegaly appreciated. Cor: PMI nondisplaced. Regular  rate & rhythm. Soft SEM at RSB Lungs: clear Abdomen: soft, nontender, nondistended. No hepatosplenomegaly. No bruits or masses. Good bowel sounds. Extremities: no cyanosis, clubbing, rash, edema Neuro: alert & oriented x 3, cranial nerves grossly intact. moves all 4 extremities w/o difficulty. Affect pleasant. Skin : Rash noted on trunk    No results found for this or any previous visit (from the past 24 hour(s)). No results found.   ASSESSMENT & PLAN:  1. L Breast Cancer:  She has completed treatment. Dr Haroldine Laws reviewed ECHO.  EF and Doppler parameters stable.  2. Palpitations: controlled on atenolol. If get worse will need monitor.   Follow up as needed.   CLEGG,AMY NP-C   06/04/2014  Patient seen and examined with Darrick Grinder, NP. We discussed all aspects of the encounter. I agree with the assessment and plan as stated above.   Echo reviewed. Poor windows but overall LV function and other parameters are stable. Can f/u as needed.  Shmiel Morton,MD 10:35 AM

## 2014-06-04 NOTE — Progress Notes (Signed)
  Echocardiogram 2D Echocardiogram limited has been performed.  [name] [name] 06/04/2014, 9:46 AM

## 2014-06-04 NOTE — Addendum Note (Signed)
Encounter addended by: Scarlette Calico, RN on: 06/04/2014 10:39 AM<BR>     Documentation filed: Patient Instructions Section

## 2014-07-16 ENCOUNTER — Ambulatory Visit (HOSPITAL_BASED_OUTPATIENT_CLINIC_OR_DEPARTMENT_OTHER): Payer: BLUE CROSS/BLUE SHIELD | Admitting: Nurse Practitioner

## 2014-07-16 ENCOUNTER — Encounter: Payer: Self-pay | Admitting: Nurse Practitioner

## 2014-07-16 ENCOUNTER — Ambulatory Visit: Payer: BC Managed Care – PPO | Admitting: Adult Health

## 2014-07-16 ENCOUNTER — Telehealth: Payer: Self-pay | Admitting: Nurse Practitioner

## 2014-07-16 ENCOUNTER — Other Ambulatory Visit (HOSPITAL_BASED_OUTPATIENT_CLINIC_OR_DEPARTMENT_OTHER): Payer: BLUE CROSS/BLUE SHIELD

## 2014-07-16 VITALS — BP 130/60 | HR 56 | Temp 98.6°F | Resp 18 | Ht 61.0 in | Wt 166.3 lb

## 2014-07-16 DIAGNOSIS — N898 Other specified noninflammatory disorders of vagina: Secondary | ICD-10-CM | POA: Insufficient documentation

## 2014-07-16 DIAGNOSIS — C50412 Malignant neoplasm of upper-outer quadrant of left female breast: Secondary | ICD-10-CM

## 2014-07-16 LAB — COMPREHENSIVE METABOLIC PANEL (CC13)
ALT: 19 U/L (ref 0–55)
AST: 13 U/L (ref 5–34)
Albumin: 3.7 g/dL (ref 3.5–5.0)
Alkaline Phosphatase: 59 U/L (ref 40–150)
Anion Gap: 5 mEq/L (ref 3–11)
BUN: 12.5 mg/dL (ref 7.0–26.0)
CO2: 30 mEq/L — ABNORMAL HIGH (ref 22–29)
Calcium: 9.2 mg/dL (ref 8.4–10.4)
Chloride: 108 mEq/L (ref 98–109)
Creatinine: 0.9 mg/dL (ref 0.6–1.1)
EGFR: 86 mL/min/{1.73_m2} — ABNORMAL LOW (ref >90)
Glucose: 84 mg/dl (ref 70–140)
Potassium: 3.8 mEq/L (ref 3.5–5.1)
Sodium: 143 mEq/L (ref 136–145)
Total Bilirubin: 0.28 mg/dL (ref 0.20–1.20)
Total Protein: 6.8 g/dL (ref 6.4–8.3)

## 2014-07-16 LAB — CBC WITH DIFFERENTIAL/PLATELET
BASO%: 0.1 % (ref 0.0–2.0)
Basophils Absolute: 0 10*3/uL (ref 0.0–0.1)
EOS%: 0.5 % (ref 0.0–7.0)
Eosinophils Absolute: 0 10*3/uL (ref 0.0–0.5)
HCT: 34.4 % — ABNORMAL LOW (ref 34.8–46.6)
HGB: 11.3 g/dL — ABNORMAL LOW (ref 11.6–15.9)
LYMPH%: 18 % (ref 14.0–49.7)
MCH: 28.5 pg (ref 25.1–34.0)
MCHC: 32.8 g/dL (ref 31.5–36.0)
MCV: 86.9 fL (ref 79.5–101.0)
MONO#: 0.6 10*3/uL (ref 0.1–0.9)
MONO%: 7 % (ref 0.0–14.0)
NEUT#: 6 10*3/uL (ref 1.5–6.5)
NEUT%: 74.4 % (ref 38.4–76.8)
Platelets: 197 10*3/uL (ref 145–400)
RBC: 3.96 10*6/uL (ref 3.70–5.45)
RDW: 14.3 % (ref 11.2–14.5)
WBC: 8 10*3/uL (ref 3.9–10.3)
lymph#: 1.4 10*3/uL (ref 0.9–3.3)

## 2014-07-16 NOTE — Telephone Encounter (Signed)
per pof to sch pt appt-sch mamma-gave pt copy of sch

## 2014-07-16 NOTE — Progress Notes (Signed)
ID: [name] OB: Nov 28, 1960  MR#: [id number]  XJO#:[id number]  PCP: Rubbie Battiest, MD ; Pearson Forster, NP GYN:   Josefa Half, MD  SU: Alphonsa Overall, MD [place]:  Thea Silversmith, MD OTHER MD: Freida Busman, MD;  Neysa Bonito, MD  CHIEF COMPLAINT:  Left Breast Cancer CURRENT TREATMENT: Anastrozole daily  BREAST CANCER HISTORY: From the original intake note 04/12/2013:  Shannel palpated a mass in her left breast in late August 2014. She was already scheduled for screening mammography at [facility] and this was performed 03/13/2013. Indeed a possible mass was noted in the left breast and on 03/29/2013 the patient underwent diagnostic left mammography and left ultrasonography. This showed a 2.5 cm irregular mass in the upper outer quadrant of the left breast. This was palpable.by ultrasound there was a 1.6 cm irregular hypoechoic mass in the area in question, and in addition to enlarged left axillary lymph nodes were noted, the largest measuring 2.5 cm.  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  Bilateral breast MRI 04/11/2013 shows 3 abnormal enhancing masses in the upper outer quadrant of the left breast measuring altogether 6.4 cm. The largest separate mass measured 2.7 cm. There were abnormal or large lymph nodes in the left axilla. Ultrasound guided biopsy of the 2 smaller masses has been scheduled.  The patient's subsequent history is as detailed below   INTERVAL HISTORY: Wesleigh returns today for follow up of her left breast cancer. She has been on anastrozole since July 2015 and is tolerating it moderately well. She denies hot flashes, but  has some arthralgias and vaginal dryness. She is status post chiari decompression, subocciptal craniectomy, upper cervical laminectomy, and duraplasty in November 2015, after an MRI performed for intense headaches showed the chiari formation. She still has headaches but they are much less intense. She was put on cymbalta daily which also helps the arthralgia pain.   REVIEW OF SYSTEMS: Mylina denies fevers, chills, nausea, vomiting, or changes in bowel or bladder habits. She has no shortness of breath, chest pain, cough, or palpitations. She denies dizziness, unexplained weight loss, fatigue, or night sweats. A detailed review of systems is otherwise negative.   PAST MEDICAL HISTORY: Past Medical History  Diagnosis Date  . Chest pain 2009    Consultation-[facility], negative chest CT; nl echo in 2005; h/o palpitations  . Palpitations   . Degenerative joint disease     + degenerative joint disease of the lumbosacral spine  . Colitis 2010    not IBD  . Herpes simplex type II infection   . Allergic rhinitis   . Hypercholesteremia     "slightly high"  . Anxiety   . Heart murmur     "small"  . GERD (gastroesophageal reflux disease)     "a little"  . Depression   . Anemia, unspecified 05/30/2013  . Wears glasses   . Breast cancer 03/31/13    left  . Cancer   . Dysrhythmia     palpations  . Hypertension     does not have high blood pressure  . Headache     PAST SURGICAL HISTORY: Past Surgical History  Procedure Laterality Date  . Colonoscopy  10/2010    proctitis; melanosis coli  . Tubal ligation    . Breast excisional biopsy  04/2003    Left; benign disease  . Right oophorectomy  03/13/2007  . Abdominal hysterectomy  03/13/2007    TAH ?BSO--Dr Levin Bacon, [place]  . Eye surgery Left     "fix lazy eye"  . Portacath placement Right 04/21/2013    Procedure: INSERTION PORT-A-CATH;  Surgeon: [name], MD;  Location: [facility];  Service: General;  Laterality: Right;  . Breast  lumpectomy with needle localization and axillary lymph node dissection Left 09/12/2013    Procedure: BREAST LUMPECTOMY WITH NEEDLE LOCALIZATION AND AXILLARY LYMPH NODE DISSECTION;  Surgeon: [name], MD;  Location: [facility];  Service: General;  Laterality: Left;  and axilla  . Breast surgery    . Suboccipital craniectomy cervical laminectomy N/A 05/10/2014    Procedure: SUBOCCIPITAL CRANIECTOMY CERVICAL LAMINECTOMY/DURAPLASTY;  Surgeon: [name], MD;  Location: [facility] [place] [facility];  Service: Neurosurgery;  Laterality: N/A;  suboccipital craniectomy with cervical laminectomy and duraplasty    FAMILY HISTORY Family History  Problem Relation Age of Onset  . Aneurysm Mother     Cerebral  . Hypertension Mother   . Hyperlipidemia Mother   . Stroke Mother   . Coronary artery disease Father   . Hypertension Father   . Hyperlipidemia Father   . Lung cancer Brother   . Cancer Brother     Lung  The patient's father died at the age of 17 from a myocardial infarction. The patient's mother died at the age of 13 from a stroke. The patient had 6 brothers, 2 sisters. One brother has a history of lung cancer in the setting of tobacco abuse. Another brother had multiple myeloma. There is no history of breast or ovarian cancer in the family  GYNECOLOGIC HISTORY: (Updated 09/25/2013) Menarche age 17, first live birth age 49, the patient is [place] P4. She status post abdominal hysterectomy with bilateral salpingo-oophorectomy. She never took hormone replacement.   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 Her husband [name] works in Charity fundraiser. Daughter [name] formerly lived at home with the patient with her 3 children, all under the age of 10; they have subsequently moved out. She is not employed. Son Denishia Citro lives in [place] and is not employed. Daughter Maggie Schwalbe Dillard lives in [place] and is a Social research officer, government. Daughter Ernst Spell is a Education officer, museum in [place]. The patient has 10 grandchildren. She attends the [employer]     ADVANCED DIRECTIVES: Not in place   HEALTH MAINTENANCE:  (Updated 09/25/2013)  History  Substance Use Topics  . Smoking status: Former Smoker -- 0.50 packs/day for 15 years    Types: Cigarettes    Quit date: 07/06/1982  . Smokeless tobacco: Never Used  . Alcohol Use: No     Colonoscopy:2012  PAP:2014   Bone density: Never  Lipid panel: Not on file    Allergies  Allergen Reactions  . Doxycycline Other (See Comments)    Unknown    Current Outpatient Prescriptions  Medication Sig Dispense Refill  . acetaminophen (TYLENOL) 500 MG tablet Take 500 mg by mouth every 6 (six) hours as needed.    . ALPRAZolam (XANAX) 1 MG tablet Take 1 mg by mouth at bedtime.    Marland Kitchen anastrozole (ARIMIDEX) 1 MG  tablet Take 1 tablet (1 mg total) by mouth daily. 90 tablet 4  . atenolol (TENORMIN) 50 MG tablet TAKE 1 TABLET BY MOUTH DAILY 90 tablet 0  . celecoxib (CELEBREX) 200 MG capsule Take 200 mg by mouth daily as needed for mild pain.     . Cholecalciferol (VITAMIN D PO) Take 1 tablet by mouth daily.    . citalopram (CELEXA) 40 MG tablet TAKE 1 TABLET BY MOUTH EVERY DAY 90 tablet 0  . diphenhydramine-acetaminophen (TYLENOL PM) 25-500 MG TABS Take 2 tablets by mouth at bedtime.    . pantoprazole (PROTONIX) 40 MG tablet Take 1 tablet (40 mg total) by mouth daily. 90 tablet 1  . valACYclovir (VALTREX) 1000 MG tablet Take 1,000 mg by mouth daily.    Marland Kitchen oxyCODONE-acetaminophen (PERCOCET/ROXICET) 5-325 MG per tablet Take 1-2 tablets by mouth every 4 (four) hours as needed (pain). (Patient not taking: Reported on 06/04/2014) 60 tablet 0  . [DISCONTINUED] potassium chloride SA (K-DUR,KLOR-CON) 20 MEQ tablet Take 1 tablet (20 mEq total) by mouth 2 (two) times daily. 20 tablet 1   No current facility-administered medications for this visit.    OBJECTIVE:  Middle-aged [place] American woman who appears stated age 54 Vitals:   07/16/14 1105  BP: 130/60  Pulse: 56  Temp: 98.6 F (37 C)  Resp: 18  Body mass index is 31.44 kg/(m^2).  ECOG: 1 Filed Weights   07/16/14 1105  Weight: 166 lb 4.8 oz (75.433 kg)   Skin: warm, dry  HEENT: sclerae anicteric, conjunctivae pink, oropharynx clear. No thrush or mucositis.  Lymph Nodes: No cervical or supraclavicular lymphadenopathy  Lungs: clear to auscultation bilaterally, no rales, wheezes, or rhonci  Heart: regular rate and rhythm  Abdomen: round, soft, non tender, positive bowel sounds  Musculoskeletal: No focal spinal tenderness, no peripheral edema  Neuro: non focal, well oriented, positive affect  Breasts: left breast status post lumpectomy and radiation. No evidence of recurrent disease. Left axilla benign. Right breast unremarkable.   LAB RESULTS:   Lab Results  Component Value Date   WBC 8.0 07/16/2014   NEUTROABS 6.0 07/16/2014   HGB 11.3* 07/16/2014   HCT 34.4* 07/16/2014   MCV 86.9 07/16/2014   PLT 197 07/16/2014      Chemistry      Component Value Date/Time   NA 143 07/16/2014 1042   NA 142 05/08/2014 1514   K 3.8 07/16/2014 1042   K 4.4 05/08/2014 1514   CL 101 05/08/2014 1514   CO2 30* 07/16/2014 1042   CO2 26 05/08/2014 1514   BUN 12.5 07/16/2014 1042   BUN 14 05/08/2014 1514   CREATININE 0.9 07/16/2014 1042   CREATININE 1.00 05/08/2014 1514      Component Value Date/Time   CALCIUM 9.2 07/16/2014 1042   CALCIUM 9.2 05/08/2014 1514   ALKPHOS 59 07/16/2014 1042   ALKPHOS 62 04/12/2008 1725   AST 13 07/16/2014 1042   AST 21 04/12/2008 1725   ALT 19 07/16/2014 1042   ALT 28 04/12/2008 1725   BILITOT 0.28 07/16/2014 1042   BILITOT 0.5 04/12/2008 1725         STUDIES: No results found.  ASSESSMENT/PLAN: 54 y.o. [place], [place] woman   (1)  status post left breast biopsy 03/29/2013 for a clinical T2-T3 pN1, stage IIB-IIIA invasive ductal carcinoma, grade  2-3, estrogen receptor 39% positive (with moderate staining intensity), progesterone receptor negative, with an MIB-1 of 83%, and HER-2 amplified by CISH, with a ratio of 7.5 and  15 HER-2 copies per cell  (2) neoadjuvant chemotherapy, with trastuzumab/ pertuzumab/ carboplatin/ docetaxel repeated every 3 weeks x6, completed 08/21/2013. Pertuzumab  held after 4 cycles secondary to diarrhea. The patient received Neulasta on day 2 at [facility] per her request.   (3) Trastuzumab to be continued for total of one year (to mid October 2015). Most recent echo 03/01/2014 showed a well preserved ejection fraction.  (4)  status post left lumpectomy and axillary node dissection 09/12/2013, with a complete pathologic response (a total of 16 lymph nodes were examined,1 sentinel and 15 non-sentinel),  YpT0, ypN0.  (5) adjuvant radiation at [facility] completed 12/08/2013  (6) to start anastrozole 01/16/2014  (7) status post remote TAH/ BSO  PLAN: Tniyah is doing well today. The labs were reviewed in detail and were entirely stable. She is tolerating the anastrozole well and will continue this drug for at least 5 years of antiestrogen therapy. She will continue the cymbalta which is effective for her joint pain. For her vaginal dryness I suggested she use water-based lubrication or coconut oil PRN.   Duyen will return in 3 months for labs and a follow up visit. I have placed orders for her repeat mammogram which is due in February. She understands and agrees with this plan. She knows the goal of treatment in her case is cure. She has been encouraged to call with any issues that might arise before her next visit here.  Marcelino Duster, [place] [phone] 07/16/2014 11:50 AM

## 2014-07-24 ENCOUNTER — Inpatient Hospital Stay: Admission: RE | Admit: 2014-07-24 | Payer: BLUE CROSS/BLUE SHIELD | Source: Ambulatory Visit

## 2014-07-25 ENCOUNTER — Other Ambulatory Visit: Payer: Self-pay | Admitting: Nurse Practitioner

## 2014-07-25 ENCOUNTER — Ambulatory Visit
Admission: RE | Admit: 2014-07-25 | Discharge: 2014-07-25 | Disposition: A | Discharge status: Home / Self Care | Payer: BLUE CROSS/BLUE SHIELD | Source: Ambulatory Visit | Attending: Nurse Practitioner | Admitting: Nurse Practitioner

## 2014-07-25 DIAGNOSIS — C50412 Malignant neoplasm of upper-outer quadrant of left female breast: Secondary | ICD-10-CM

## 2014-07-29 ENCOUNTER — Other Ambulatory Visit: Payer: Self-pay | Admitting: Family Medicine

## 2014-08-07 ENCOUNTER — Other Ambulatory Visit: Payer: Self-pay | Admitting: Neurology

## 2014-08-07 DIAGNOSIS — R51 Headache: Principal | ICD-10-CM

## 2014-08-07 DIAGNOSIS — R519 Headache, unspecified: Secondary | ICD-10-CM

## 2014-08-10 ENCOUNTER — Encounter: Payer: Self-pay | Admitting: Family Medicine

## 2014-08-10 ENCOUNTER — Ambulatory Visit (INDEPENDENT_AMBULATORY_CARE_PROVIDER_SITE_OTHER): Payer: BLUE CROSS/BLUE SHIELD | Admitting: Family Medicine

## 2014-08-10 VITALS — BP 120/78 | Ht 61.0 in | Wt 171.4 lb

## 2014-08-10 DIAGNOSIS — R519 Headache, unspecified: Secondary | ICD-10-CM

## 2014-08-10 DIAGNOSIS — R51 Headache: Secondary | ICD-10-CM

## 2014-08-10 DIAGNOSIS — F329 Major depressive disorder, single episode, unspecified: Secondary | ICD-10-CM

## 2014-08-10 DIAGNOSIS — I10 Essential (primary) hypertension: Secondary | ICD-10-CM

## 2014-08-10 DIAGNOSIS — F32A Depression, unspecified: Secondary | ICD-10-CM

## 2014-08-10 DIAGNOSIS — G935 Compression of brain: Secondary | ICD-10-CM

## 2014-08-10 DIAGNOSIS — K219 Gastro-esophageal reflux disease without esophagitis: Secondary | ICD-10-CM

## 2014-08-10 MED ORDER — ESCITALOPRAM OXALATE 20 MG PO TABS: 20.0000 mg | ORAL_TABLET | Freq: Every day | ORAL | Status: DC

## 2014-08-10 NOTE — Progress Notes (Signed)
   Subjective:    Patient ID: [name] [name], female    DOB: Nov 12, 1960, 54 y.o.   MRN: [id number]  HPI  Patient arrives to discuss worsening depression and anxiety for the last 6 months.  Has job at [employer]. Patient went back to work. Due to severity of headaches was unable to work. Notes ongoing chronic posterior neck pain. And significant shoulder pain. Due to see a physical therapist soon. With Elenore Rota relenting headaches and shoulder pain absolutely unable to perform in her current job. Also noted the environment at work did not help things.  Had chiari surg oct 5th  Just went back to work last week with the neurosur. He felt that neurosurgically he had done all that he could. He recommended patient see a headache specialist ASAP  Due to start PT interventions soon  Pt now sched with Heafdache specialist--Dr Lewitt saw pt and dx'ed tension headaches and placed a order for MRI  Pt was taking xanax for anxiety  Also on daily celexa 40 qd  Pt lives with husband, daughter recently moved out  Relationship good with husband but strained (he has diab and sarcoidosis too)  Dr lewitt started pt on muscles relaxant  Reflux comes and goes, definitely needs the med.  Stomach burns woith some meds and feels nausuated at times Worked a Naval architect for six yrs  Unable to exercise significantly   Review of Systems Substantial intermittent headaches, no vomiting no diarrhea no change in bowel habits no joint pain ROS otherwise    Objective:   Physical Exam Alert vital stable somewhat tearful during exam neck not supple positive pain with lateral rotation limited for flexion posterior scar noted lungs clear shoulders decent range of motion heart regular in rhythm.       Assessment & Plan:  Impression 1 unrelenting headaches severe in nature with inability to do any kind of work. #2 worsening depression. Certainly understandable with diagnosis of breast cancer and now Chiari malformation  requiring tremendous intervention #3 breast cancer discussed plan 40 minutes spent most in discussion. Stop Celexa. Start Lexapro. Regular counseling encouraged and discussed. May need to do through patient's workplace. Work excuse written. Follow-up as scheduled. WSL

## 2014-08-13 ENCOUNTER — Ambulatory Visit
Admission: RE | Admit: 2014-08-13 | Discharge: 2014-08-13 | Disposition: A | Discharge status: Home / Self Care | Payer: BLUE CROSS/BLUE SHIELD | Source: Ambulatory Visit | Attending: Neurology | Admitting: Neurology

## 2014-08-13 DIAGNOSIS — R519 Headache, unspecified: Secondary | ICD-10-CM

## 2014-08-13 DIAGNOSIS — R51 Headache: Principal | ICD-10-CM

## 2014-08-15 ENCOUNTER — Ambulatory Visit: Payer: BLUE CROSS/BLUE SHIELD | Attending: Neurology | Admitting: Physical Therapy

## 2014-08-15 DIAGNOSIS — M25559 Pain in unspecified hip: Secondary | ICD-10-CM | POA: Diagnosis not present

## 2014-08-15 DIAGNOSIS — Z853 Personal history of malignant neoplasm of breast: Secondary | ICD-10-CM | POA: Insufficient documentation

## 2014-08-15 DIAGNOSIS — M542 Cervicalgia: Secondary | ICD-10-CM | POA: Insufficient documentation

## 2014-08-15 DIAGNOSIS — G935 Compression of brain: Secondary | ICD-10-CM | POA: Diagnosis not present

## 2014-08-20 ENCOUNTER — Encounter: Payer: BLUE CROSS/BLUE SHIELD | Admitting: *Deleted

## 2014-08-21 DIAGNOSIS — Z0289 Encounter for other administrative examinations: Secondary | ICD-10-CM

## 2014-08-22 ENCOUNTER — Emergency Department (HOSPITAL_COMMUNITY): Payer: BLUE CROSS/BLUE SHIELD

## 2014-08-22 ENCOUNTER — Encounter (HOSPITAL_COMMUNITY): Payer: Self-pay | Admitting: Family Medicine

## 2014-08-22 ENCOUNTER — Telehealth: Payer: Self-pay | Admitting: Family Medicine

## 2014-08-22 ENCOUNTER — Inpatient Hospital Stay (HOSPITAL_COMMUNITY)
Admission: EM | Admit: 2014-08-22 | Discharge: 2014-08-24 | DRG: 099 | Disposition: A | Discharge status: Home / Self Care | Payer: BLUE CROSS/BLUE SHIELD | Attending: Internal Medicine | Admitting: Internal Medicine

## 2014-08-22 DIAGNOSIS — Z853 Personal history of malignant neoplasm of breast: Secondary | ICD-10-CM

## 2014-08-22 DIAGNOSIS — Z90710 Acquired absence of both cervix and uterus: Secondary | ICD-10-CM

## 2014-08-22 DIAGNOSIS — G039 Meningitis, unspecified: Principal | ICD-10-CM

## 2014-08-22 DIAGNOSIS — M1990 Unspecified osteoarthritis, unspecified site: Secondary | ICD-10-CM | POA: Diagnosis present

## 2014-08-22 DIAGNOSIS — E785 Hyperlipidemia, unspecified: Secondary | ICD-10-CM | POA: Diagnosis present

## 2014-08-22 DIAGNOSIS — Z87891 Personal history of nicotine dependence: Secondary | ICD-10-CM | POA: Diagnosis not present

## 2014-08-22 DIAGNOSIS — E7800 Pure hypercholesterolemia, unspecified: Secondary | ICD-10-CM | POA: Diagnosis present

## 2014-08-22 DIAGNOSIS — D649 Anemia, unspecified: Secondary | ICD-10-CM | POA: Diagnosis present

## 2014-08-22 DIAGNOSIS — R011 Cardiac murmur, unspecified: Secondary | ICD-10-CM | POA: Diagnosis present

## 2014-08-22 DIAGNOSIS — Z6832 Body mass index (BMI) 32.0-32.9, adult: Secondary | ICD-10-CM | POA: Diagnosis not present

## 2014-08-22 DIAGNOSIS — R509 Fever, unspecified: Secondary | ICD-10-CM | POA: Insufficient documentation

## 2014-08-22 DIAGNOSIS — J189 Pneumonia, unspecified organism: Secondary | ICD-10-CM

## 2014-08-22 DIAGNOSIS — R002 Palpitations: Secondary | ICD-10-CM | POA: Diagnosis present

## 2014-08-22 DIAGNOSIS — E669 Obesity, unspecified: Secondary | ICD-10-CM | POA: Diagnosis present

## 2014-08-22 DIAGNOSIS — R51 Headache: Secondary | ICD-10-CM | POA: Diagnosis not present

## 2014-08-22 DIAGNOSIS — C50912 Malignant neoplasm of unspecified site of left female breast: Secondary | ICD-10-CM | POA: Diagnosis not present

## 2014-08-22 DIAGNOSIS — K219 Gastro-esophageal reflux disease without esophagitis: Secondary | ICD-10-CM | POA: Diagnosis present

## 2014-08-22 DIAGNOSIS — F329 Major depressive disorder, single episode, unspecified: Secondary | ICD-10-CM | POA: Diagnosis present

## 2014-08-22 DIAGNOSIS — F32A Depression, unspecified: Secondary | ICD-10-CM | POA: Diagnosis present

## 2014-08-22 DIAGNOSIS — F419 Anxiety disorder, unspecified: Secondary | ICD-10-CM | POA: Diagnosis present

## 2014-08-22 DIAGNOSIS — I10 Essential (primary) hypertension: Secondary | ICD-10-CM | POA: Diagnosis present

## 2014-08-22 DIAGNOSIS — Z881 Allergy status to other antibiotic agents status: Secondary | ICD-10-CM

## 2014-08-22 DIAGNOSIS — C50919 Malignant neoplasm of unspecified site of unspecified female breast: Secondary | ICD-10-CM | POA: Diagnosis present

## 2014-08-22 DIAGNOSIS — Z9889 Other specified postprocedural states: Secondary | ICD-10-CM | POA: Diagnosis not present

## 2014-08-22 DIAGNOSIS — Z8669 Personal history of other diseases of the nervous system and sense organs: Secondary | ICD-10-CM

## 2014-08-22 DIAGNOSIS — Z9851 Tubal ligation status: Secondary | ICD-10-CM | POA: Diagnosis not present

## 2014-08-22 HISTORY — DX: Meningitis, unspecified: G03.9

## 2014-08-22 LAB — CRYPTOCOCCAL ANTIGEN, CSF: Crypto Ag: NEGATIVE

## 2014-08-22 LAB — CSF CELL COUNT WITH DIFFERENTIAL
Eosinophils, CSF: 1 % (ref 0–1)
Eosinophils, CSF: 1 % (ref 0–1)
Lymphs, CSF: 30 % — ABNORMAL LOW (ref 40–80)
Lymphs, CSF: 40 % (ref 40–80)
Monocyte-Macrophage-Spinal Fluid: 15 % (ref 15–45)
Monocyte-Macrophage-Spinal Fluid: 5 % — ABNORMAL LOW (ref 15–45)
Other Cells, CSF: 0
Other Cells, CSF: 0
RBC Count, CSF: 0 /mm3
RBC Count, CSF: 1 /mm3 — ABNORMAL HIGH
Segmented Neutrophils-CSF: 54 % — ABNORMAL HIGH (ref 0–6)
Segmented Neutrophils-CSF: 54 % — ABNORMAL HIGH (ref 0–6)
Tube #: 1
Tube #: 4
WBC, CSF: 805 /mm3 (ref 0–5)
WBC, CSF: 880 /mm3 (ref 0–5)

## 2014-08-22 LAB — URINE MICROSCOPIC-ADD ON

## 2014-08-22 LAB — CBC WITH DIFFERENTIAL/PLATELET
Basophils Absolute: 0 10*3/uL (ref 0.0–0.1)
Basophils Relative: 0 % (ref 0–1)
Eosinophils Absolute: 0 10*3/uL (ref 0.0–0.7)
Eosinophils Relative: 0 % (ref 0–5)
HCT: 39.3 % (ref 36.0–46.0)
Hemoglobin: 13.1 g/dL (ref 12.0–15.0)
Lymphocytes Relative: 15 % (ref 12–46)
Lymphs Abs: 1.4 10*3/uL (ref 0.7–4.0)
MCH: 28.7 pg (ref 26.0–34.0)
MCHC: 33.3 g/dL (ref 30.0–36.0)
MCV: 86.2 fL (ref 78.0–100.0)
Monocytes Absolute: 0.7 10*3/uL (ref 0.1–1.0)
Monocytes Relative: 8 % (ref 3–12)
Neutro Abs: 7.3 10*3/uL (ref 1.7–7.7)
Neutrophils Relative %: 77 % (ref 43–77)
Platelets: 181 10*3/uL (ref 150–400)
RBC: 4.56 MIL/uL (ref 3.87–5.11)
RDW: 14.9 % (ref 11.5–15.5)
WBC: 9.4 10*3/uL (ref 4.0–10.5)

## 2014-08-22 LAB — COMPREHENSIVE METABOLIC PANEL
ALT: 24 U/L (ref 0–35)
AST: 19 U/L (ref 0–37)
Albumin: 4 g/dL (ref 3.5–5.2)
Alkaline Phosphatase: 54 U/L (ref 39–117)
Anion gap: 10 (ref 5–15)
BUN: 10 mg/dL (ref 6–23)
CO2: 29 mmol/L (ref 19–32)
Calcium: 9.7 mg/dL (ref 8.4–10.5)
Chloride: 102 mmol/L (ref 96–112)
Creatinine, Ser: 1.08 mg/dL (ref 0.50–1.10)
GFR calc Af Amer: 67 mL/min — ABNORMAL LOW (ref >90)
GFR calc non Af Amer: 58 mL/min — ABNORMAL LOW (ref >90)
Glucose, Bld: 77 mg/dL (ref 70–99)
Potassium: 3.7 mmol/L (ref 3.5–5.1)
Sodium: 141 mmol/L (ref 135–145)
Total Bilirubin: 0.7 mg/dL (ref 0.3–1.2)
Total Protein: 7.2 g/dL (ref 6.0–8.3)

## 2014-08-22 LAB — URINALYSIS, ROUTINE W REFLEX MICROSCOPIC
Bilirubin Urine: NEGATIVE
Glucose, UA: NEGATIVE mg/dL
Hgb urine dipstick: NEGATIVE
Ketones, ur: NEGATIVE mg/dL
Nitrite: NEGATIVE
Protein, ur: NEGATIVE mg/dL
Specific Gravity, Urine: 1.013 (ref 1.005–1.030)
Urobilinogen, UA: 1 mg/dL (ref 0.0–1.0)
pH: 7 (ref 5.0–8.0)

## 2014-08-22 LAB — GRAM STAIN

## 2014-08-22 LAB — PROTEIN AND GLUCOSE, CSF
Glucose, CSF: 33 mg/dL — ABNORMAL LOW (ref 43–76)
Total  Protein, CSF: 33 mg/dL (ref 15–45)

## 2014-08-22 LAB — INFLUENZA PANEL BY PCR (TYPE A & B)
H1N1 flu by pcr: NOT DETECTED
Influenza A By PCR: NEGATIVE
Influenza B By PCR: NEGATIVE

## 2014-08-22 LAB — I-STAT CG4 LACTIC ACID, ED
Lactic Acid, Venous: 1.08 mmol/L (ref 0.5–2.0)
Lactic Acid, Venous: 0.66 mmol/L (ref 0.5–2.0)

## 2014-08-22 MED ORDER — VITAMIN D3 25 MCG (1000 UNIT) PO TABS
1000.0000 [IU] | ORAL_TABLET | Freq: Every day | ORAL | Status: DC
  Administered 2014-08-22 – 2014-08-24 (×3): 1000 [IU] via ORAL
  Filled 2014-08-22 (×3): qty 1

## 2014-08-22 MED ORDER — VANCOMYCIN HCL IN DEXTROSE 750-5 MG/150ML-% IV SOLN
750.0000 mg | Freq: Two times a day (BID) | INTRAVENOUS | Status: DC
  Administered 2014-08-23 (×2): 750 mg via INTRAVENOUS
  Filled 2014-08-22 (×4): qty 150

## 2014-08-22 MED ORDER — ONDANSETRON HCL 4 MG/2ML IJ SOLN: 4.0000 mg | Freq: Four times a day (QID) | INTRAMUSCULAR | Status: DC | PRN

## 2014-08-22 MED ORDER — VANCOMYCIN HCL IN DEXTROSE 1-5 GM/200ML-% IV SOLN
1000.0000 mg | Freq: Once | INTRAVENOUS | Status: AC
  Administered 2014-08-22: 1000 mg via INTRAVENOUS
  Filled 2014-08-22: qty 200

## 2014-08-22 MED ORDER — SODIUM CHLORIDE 0.9 % IV SOLN
INTRAVENOUS | Status: DC
  Administered 2014-08-22: 23:00:00 via INTRAVENOUS
  Filled 2014-08-22 (×3): qty 1000

## 2014-08-22 MED ORDER — CEFTAZIDIME 2 G IJ SOLR
2.0000 g | Freq: Three times a day (TID) | INTRAMUSCULAR | Status: DC
  Administered 2014-08-22 – 2014-08-24 (×5): 2 g via INTRAVENOUS
  Filled 2014-08-22 (×7): qty 2

## 2014-08-22 MED ORDER — ACETAMINOPHEN 325 MG PO TABS
650.0000 mg | ORAL_TABLET | Freq: Four times a day (QID) | ORAL | Status: DC | PRN
  Administered 2014-08-22 – 2014-08-23 (×3): 650 mg via ORAL
  Filled 2014-08-22 (×3): qty 2

## 2014-08-22 MED ORDER — FENTANYL CITRATE 0.05 MG/ML IJ SOLN
50.0000 ug | Freq: Once | INTRAMUSCULAR | Status: AC
  Administered 2014-08-22: 50 ug via INTRAVENOUS
  Filled 2014-08-22: qty 2

## 2014-08-22 MED ORDER — ACETAMINOPHEN 650 MG RE SUPP: 650.0000 mg | Freq: Four times a day (QID) | RECTAL | Status: DC | PRN

## 2014-08-22 MED ORDER — DIPHENHYDRAMINE HCL 25 MG PO CAPS
25.0000 mg | ORAL_CAPSULE | Freq: Every evening | ORAL | Status: DC | PRN
  Administered 2014-08-22 – 2014-08-23 (×2): 25 mg via ORAL
  Filled 2014-08-22 (×2): qty 1

## 2014-08-22 MED ORDER — ESCITALOPRAM OXALATE 20 MG PO TABS
20.0000 mg | ORAL_TABLET | Freq: Every day | ORAL | Status: DC
  Administered 2014-08-22 – 2014-08-24 (×3): 20 mg via ORAL
  Filled 2014-08-22 (×3): qty 1

## 2014-08-22 MED ORDER — ANASTROZOLE 1 MG PO TABS
1.0000 mg | ORAL_TABLET | Freq: Every day | ORAL | Status: DC
  Administered 2014-08-23 – 2014-08-24 (×2): 1 mg via ORAL
  Filled 2014-08-22 (×4): qty 1

## 2014-08-22 MED ORDER — CEFTRIAXONE SODIUM 2 G IJ SOLR
2.0000 g | Freq: Once | INTRAMUSCULAR | Status: AC
  Administered 2014-08-22: 2 g via INTRAVENOUS
  Filled 2014-08-22: qty 2

## 2014-08-22 MED ORDER — DEXTROSE 5 % IV SOLN
10.0000 mg/kg | Freq: Once | INTRAVENOUS | Status: AC
  Administered 2014-08-22: 480 mg via INTRAVENOUS
  Filled 2014-08-22: qty 9.6

## 2014-08-22 MED ORDER — ALPRAZOLAM 0.5 MG PO TABS
1.0000 mg | ORAL_TABLET | Freq: Every day | ORAL | Status: DC
  Administered 2014-08-22 – 2014-08-23 (×2): 1 mg via ORAL
  Filled 2014-08-22 (×2): qty 2

## 2014-08-22 MED ORDER — LEVOFLOXACIN IN D5W 500 MG/100ML IV SOLN
500.0000 mg | Freq: Once | INTRAVENOUS | Status: AC
  Administered 2014-08-22: 500 mg via INTRAVENOUS
  Filled 2014-08-22: qty 100

## 2014-08-22 MED ORDER — DEXTROSE 5 % IV SOLN
10.0000 mg/kg | Freq: Once | INTRAVENOUS | Status: DC
  Filled 2014-08-22: qty 15.4

## 2014-08-22 MED ORDER — SODIUM CHLORIDE 0.9 % IJ SOLN
3.0000 mL | Freq: Two times a day (BID) | INTRAMUSCULAR | Status: DC
  Administered 2014-08-23 – 2014-08-24 (×2): 3 mL via INTRAVENOUS

## 2014-08-22 MED ORDER — LIDOCAINE HCL (PF) 1 % IJ SOLN
5.0000 mL | Freq: Once | INTRAMUSCULAR | Status: AC
  Administered 2014-08-22: 5 mL via INTRADERMAL
  Filled 2014-08-22 (×2): qty 5

## 2014-08-22 MED ORDER — OXYCODONE HCL 5 MG PO TABS
5.0000 mg | ORAL_TABLET | ORAL | Status: DC | PRN
  Administered 2014-08-22 – 2014-08-23 (×4): 5 mg via ORAL
  Filled 2014-08-22 (×6): qty 1

## 2014-08-22 MED ORDER — SODIUM CHLORIDE 0.9 % IV SOLN
2.0000 g | Freq: Once | INTRAVENOUS | Status: AC
  Administered 2014-08-22: 2 g via INTRAVENOUS
  Filled 2014-08-22: qty 2000

## 2014-08-22 MED ORDER — ACETAMINOPHEN 500 MG PO TABS
1000.0000 mg | ORAL_TABLET | Freq: Once | ORAL | Status: AC
  Administered 2014-08-22: 1000 mg via ORAL
  Filled 2014-08-22: qty 2

## 2014-08-22 MED ORDER — PANTOPRAZOLE SODIUM 40 MG PO TBEC: 40.0000 mg | DELAYED_RELEASE_TABLET | Freq: Every day | ORAL | Status: DC | PRN

## 2014-08-22 MED ORDER — MORPHINE SULFATE 4 MG/ML IJ SOLN
4.0000 mg | Freq: Once | INTRAMUSCULAR | Status: AC
  Administered 2014-08-22: 4 mg via INTRAVENOUS
  Filled 2014-08-22: qty 1

## 2014-08-22 MED ORDER — DEXTROSE 5 % IV SOLN
500.0000 mg | Freq: Three times a day (TID) | INTRAVENOUS | Status: DC
  Administered 2014-08-22 – 2014-08-24 (×5): 500 mg via INTRAVENOUS
  Filled 2014-08-22 (×7): qty 10

## 2014-08-22 MED ORDER — ENOXAPARIN SODIUM 40 MG/0.4ML ~~LOC~~ SOLN
40.0000 mg | SUBCUTANEOUS | Status: DC
  Administered 2014-08-22 – 2014-08-23 (×2): 40 mg via SUBCUTANEOUS
  Filled 2014-08-22 (×3): qty 0.4

## 2014-08-22 MED ORDER — VANCOMYCIN HCL 500 MG IV SOLR
500.0000 mg | Freq: Once | INTRAVENOUS | Status: AC
  Administered 2014-08-22: 500 mg via INTRAVENOUS
  Filled 2014-08-22: qty 500

## 2014-08-22 MED ORDER — SODIUM CHLORIDE 0.9 % IV BOLUS (SEPSIS)
1000.0000 mL | Freq: Once | INTRAVENOUS | Status: AC
  Administered 2014-08-22: 1000 mL via INTRAVENOUS

## 2014-08-22 MED ORDER — ONDANSETRON HCL 4 MG PO TABS: 4.0000 mg | ORAL_TABLET | Freq: Four times a day (QID) | ORAL | Status: DC | PRN

## 2014-08-22 NOTE — ED Provider Notes (Addendum)
TIME SEEN: 12:30 PM  CHIEF COMPLAINT: Headache, fever, neck pain  HPI: Pt is a 54 y.o. female with history of Chiari malformation status post Suboccipital craniectomy, C1 laminectomy, superior C2 laminectomy, and duraplasty with dura guard in November 2015 with Dr. Sherwood Gambler who presents to the emergency department with complaints of diffuse throbbing headache for the past 3 days with fever of 100.7 at home, neck pain or neck stiffness, photophobia. States she does have a history of chronic headache but denies that this is similar to her prior headaches. States that her chronic headaches are normally frontal and are normally resolved with Tylenol. She has taken Tylenol without relief. Last took Tylenol this morning. Denies rash. Denies sick contacts or recent travel. No cough, vomiting or diarrhea. No abdominal pain. No numbness, tingling or focal weakness. She has had body aches. Has not had an influenza vaccination.  PCP Dr. Wolfgang Phoenix in [place]  ROS: See HPI Constitutional: fever  Eyes: no drainage  ENT: no runny nose   Cardiovascular:  no chest pain  Resp: no SOB  GI: no vomiting GU: no dysuria Integumentary: no rash  Allergy: no hives  Musculoskeletal: no leg swelling  Neurological: no slurred speech ROS otherwise negative  PAST MEDICAL HISTORY/PAST SURGICAL HISTORY:  Past Medical History  Diagnosis Date  . Chest pain 2009    Consultation-[facility], negative chest CT; nl echo in 2005; h/o palpitations  . Palpitations   . Degenerative joint disease     + degenerative joint disease of the lumbosacral spine  . Colitis 2010    not IBD  . Herpes simplex type II infection   . Allergic rhinitis   . Hypercholesteremia     "slightly high"  . Anxiety   . Heart murmur     "small"  . GERD (gastroesophageal reflux disease)     "a little"  . Depression   . Anemia, unspecified 05/30/2013  . Wears glasses   . Breast cancer 03/31/13    left  . Cancer   . Dysrhythmia     palpations   . Hypertension     does not have high blood pressure  . Headache     MEDICATIONS:  Prior to Admission medications   Medication Sig Start Date End Date Taking? Authorizing Provider  acetaminophen (TYLENOL) 500 MG tablet Take 500 mg by mouth every 6 (six) hours as needed.    Historical Provider, MD  ALPRAZolam Duanne Moron) 1 MG tablet Take 1 mg by mouth at bedtime.    Historical Provider, MD  anastrozole (ARIMIDEX) 1 MG tablet Take 1 tablet (1 mg total) by mouth daily. 12/08/13   Chauncey Cruel, MD  atenolol (TENORMIN) 50 MG tablet TAKE 1 TABLET BY MOUTH DAILY 07/30/14   Mikey Kirschner, MD  celecoxib (CELEBREX) 200 MG capsule Take 200 mg by mouth daily as needed for mild pain.  02/19/14   Historical Provider, MD  Cholecalciferol (VITAMIN D PO) Take 1 tablet by mouth daily.    Historical Provider, MD  diphenhydramine-acetaminophen (TYLENOL PM) 25-500 MG TABS Take 2 tablets by mouth at bedtime.    Historical Provider, MD  escitalopram (LEXAPRO) 20 MG tablet Take 1 tablet (20 mg total) by mouth daily. 08/10/14   Mikey Kirschner, MD  pantoprazole (PROTONIX) 40 MG tablet Take 1 tablet (40 mg total) by mouth daily. 02/23/14   Nilda Simmer, NP  valACYclovir (VALTREX) 1000 MG tablet Take 1,000 mg by mouth daily.    Historical Provider, MD    ALLERGIES:  Allergies  Allergen Reactions  . Doxycycline Other (See Comments)    Unknown    SOCIAL HISTORY:  History  Substance Use Topics  . Smoking status: Former Smoker -- 0.50 packs/day for 15 years    Types: Cigarettes    Quit date: 07/06/1982  . Smokeless tobacco: Never Used  . Alcohol Use: No    FAMILY HISTORY: Family History  Problem Relation Age of Onset  . Aneurysm Mother     Cerebral  . Hypertension Mother   . Hyperlipidemia Mother   . Stroke Mother   . Coronary artery disease Father   . Hypertension Father   . Hyperlipidemia Father   . Lung cancer Brother   . Cancer Brother     Lung    EXAM: BP 121/79 mmHg  Pulse 73   Temp(Src) 99.3 F (37.4 C)  Resp 18  SpO2 99%  LMP 12/04/2008 CONSTITUTIONAL: Alert and oriented and responds appropriately to questions. Appears uncomfortable but is nontoxic HEAD: Normocephalic EYES: Conjunctivae clear, PERRL, patient has photophobia ENT: normal nose; no rhinorrhea; moist mucous membranes; pharynx without lesions noted NECK: Supple, patient does have some neck pain with palpation of her neck, pt has meningismus, no LAD  CARD: RRR; S1 and S2 appreciated; no murmurs, no clicks, no rubs, no gallops RESP: Normal chest excursion without splinting or tachypnea; breath sounds clear and equal bilaterally; no wheezes, no rhonchi, no rales,  ABD/GI: Normal bowel sounds; non-distended; soft, non-tender, no rebound, no guarding BACK:  The back appears normal and is non-tender to palpation, there is no CVA tenderness EXT: Normal ROM in all joints; non-tender to palpation; no edema; normal capillary refill; no cyanosis    SKIN: Normal color for age and race; warm NEURO: Moves all extremities equally, sensation to light touch intact diffusely, cranial nerves II through XII intact; pt has some pain in back with lifting her legs off bed PSYCH: The patient's mood and manner are appropriate. Grooming and personal hygiene are appropriate.  MEDICAL DECISION MAKING: Patient here with headache, fever, neck pain. She is reports that this may be different than her chronic headaches but is a vague historian. She is febrile in the emergency department. Neurologically intact. We'll obtain labs, blood cultures, chest x-ray and urine. She is hemodynamically stable. I do not feel this time she needs broad-spectrum antibiotics and my suspicion for meningitis is low. Discussed with patient that we will treat her fever to see if HA improves, If not and she is still having severe headache she may need a lumbar puncture to rule out meningitis.  ED PROGRESS: Patient's labs are unremarkable. She does have a  dominant neutrophils but no leukocytosis. Lactate normal. Urine shows small leukocytes but also a few bacteria and few squamous cells. Suspect a dirty catch. Chest x-ray shows possible pneumonia. We'll give Levaquin. Patient reports significant improvement in her headache as her fever improved but is still complaining of neck pain and neck stiffness. Will obtain CSF to rule out infectious etiology.  CSF shows the WBCs and glucose 33.  Signed out to oncoming physician to follow up on cell count.  Gram stain showed no organisms.  HSV PCR sent.  Extra fluid was sent in to before for any extra studies.  Will give Vanc, CTX, Ampicillin (recommended by pharmacy as pt is over age 78), Acyclovir.  If CSF negative, will dc on Levaquin.  If CSF concerning for meningitis, will admit.     CRITICAL CARE Performed by: Nyra Jabs  Total critical care time: 45 minutes  Critical care time was exclusive of separately billable procedures and treating other patients.  Critical care was necessary to treat or prevent imminent or life-threatening deterioration.  Critical care was time spent personally by me on the following activities: development of treatment plan with patient and/or surrogate as well as nursing, discussions with consultants, evaluation of patient's response to treatment, examination of patient, obtaining history from patient or surrogate, ordering and performing treatments and interventions, ordering and review of laboratory studies, ordering and review of radiographic studies, pulse oximetry and re-evaluation of patient's condition.    Angiocath insertion Performed by: Nyra Jabs  Consent: Verbal consent obtained. Risks and benefits: risks, benefits and alternatives were discussed Time out: Immediately prior to procedure a "time out" was called to verify the correct patient, procedure, equipment, support staff and site/side marked as required.  Preparation: Patient was prepped and  draped in the usual sterile fashion.  Vein Location: R EJ  Not Ultrasound Guided  Gauge: 20  Normal blood return and flush without difficulty Patient tolerance: Patient tolerated the procedure well with no immediate complications.   LUMBAR PUNCTURE Date/Time: 08/22/2014 4:20 PM Performed by: Nyra Jabs Authorized by: Nyra Jabs Consent: Written consent obtained. Risks and benefits: risks, benefits and alternatives were discussed Consent given by: patient Patient identity confirmed: verbally with patient and arm band Time out: Immediately prior to procedure a "time out" was called to verify the correct patient, procedure, equipment, support staff and site/side marked as required. Indications: evaluation for infection Anesthesia: local infiltration Local anesthetic: lidocaine 1% without epinephrine Anesthetic total: 4 ml Patient sedated: no Preparation: Patient was prepped and draped in the usual sterile fashion. Lumbar space: L4-L5 interspace Patient's position: sitting Needle gauge: 20 Needle type: spinal needle - Quincke tip Needle length: 1.5 in Number of attempts: 1 Fluid appearance: clear Tubes of fluid: 4 Total volume: 10 ml    Delice Bison Marcos Ruelas, DO 08/22/14 [place], DO 08/22/14 [place], DO 08/22/14 1941

## 2014-08-22 NOTE — Progress Notes (Signed)
ANTIBIOTIC CONSULT NOTE - INITIAL  Pharmacy Consult for vancomycin, acyclovir Indication: r/o meningitis  Allergies  Allergen Reactions  . Doxycycline Other (See Comments)    Unknown    Patient Measurements: Height: 5\' 1"  (154.9 cm) Weight: 171 lb 11.2 oz (77.883 kg) IBW/kg (Calculated) : 47.8  Vital Signs: Temp: 98.4 F (36.9 C) (02/17 2025) Temp Source: Oral (02/17 2025) BP: 129/56 mmHg (02/17 2025) Pulse Rate: 81 (02/17 2025) Intake/Output from previous day:   Intake/Output from this shift:    Labs:  Recent Labs  08/22/14 1138  WBC 9.4  HGB 13.1  PLT 181  CREATININE 1.08   Estimated Creatinine Clearance: 56.9 mL/min (by C-G formula based on Cr of 1.08). No results for input(s): VANCOTROUGH, VANCOPEAK, VANCORANDOM, GENTTROUGH, GENTPEAK, GENTRANDOM, TOBRATROUGH, TOBRAPEAK, TOBRARND, AMIKACINPEAK, AMIKACINTROU, AMIKACIN in the last 72 hours.   Microbiology: Recent Results (from the past 720 hour(s))  Gram stain - STAT with CSF culture     Status: None   Collection Time: 08/22/14  3:47 PM  Result Value Ref Range Status   Specimen Description CSF  Final   Special Requests NONE  Final   Gram Stain   Final    CYTOSPIN PREP WBC PRESENT,BOTH PMN AND MONONUCLEAR NO ORGANISMS SEEN    Report Status 08/22/2014 FINAL  Final    Medical History: Past Medical History  Diagnosis Date  . Chest pain 2009    Consultation-[facility], negative chest CT; nl echo in 2005; h/o palpitations  . Palpitations   . Degenerative joint disease     + degenerative joint disease of the lumbosacral spine  . Colitis 2010    not IBD  . Herpes simplex type II infection   . Allergic rhinitis   . Hypercholesteremia     "slightly high"  . Anxiety   . Heart murmur     "small"  . GERD (gastroesophageal reflux disease)     "a little"  . Depression   . Anemia, unspecified 05/30/2013  . Wears glasses   . Breast cancer 03/31/13    left  . Cancer   . Dysrhythmia     palpations  .  Hypertension     does not have high blood pressure  . Headache   . Meningitis 08/22/2014    Medications:  Prescriptions prior to admission  Medication Sig Dispense Refill Last Dose  . acetaminophen (TYLENOL) 500 MG tablet Take 500 mg by mouth every 6 (six) hours as needed for moderate pain.    08/22/2014 at Unknown time  . ALPRAZolam (XANAX) 1 MG tablet Take 1 mg by mouth at bedtime.   08/21/2014 at Unknown time  . anastrozole (ARIMIDEX) 1 MG tablet Take 1 tablet (1 mg total) by mouth daily. 90 tablet 4 08/21/2014 at Unknown time  . atenolol (TENORMIN) 50 MG tablet TAKE 1 TABLET BY MOUTH DAILY 90 tablet 0 08/21/2014 at 2200  . celecoxib (CELEBREX) 200 MG capsule Take 200 mg by mouth daily as needed for mild pain.    Past Week at Unknown time  . Cholecalciferol (VITAMIN D PO) Take 1 tablet by mouth daily.   08/21/2014 at Unknown time  . diphenhydramine-acetaminophen (TYLENOL PM) 25-500 MG TABS Take 2 tablets by mouth at bedtime.   08/21/2014 at Unknown time  . escitalopram (LEXAPRO) 20 MG tablet Take 1 tablet (20 mg total) by mouth daily. 30 tablet 1 08/21/2014 at Unknown time  . pantoprazole (PROTONIX) 40 MG tablet Take 1 tablet (40 mg total) by mouth daily. (Patient taking differently:  Take 40 mg by mouth as needed. ) 90 tablet 1 Past Week at Unknown time  . valACYclovir (VALTREX) 1000 MG tablet Take 1,000 mg by mouth daily.   08/21/2014 at Unknown time   Scheduled:  . acyclovir  10 mg/kg (Ideal) Intravenous Once  . ALPRAZolam  1 mg Oral QHS  . anastrozole  1 mg Oral Daily  . cefTAZidime (FORTAZ)  IV  2 g Intravenous 3 times per day  . cholecalciferol  1,000 Units Oral Daily  . enoxaparin (LOVENOX) injection  40 mg Subcutaneous Q24H  . escitalopram  20 mg Oral Daily  . sodium chloride  3 mL Intravenous Q12H  . vancomycin  1,000 mg Intravenous Once     Assessment: 54 yo female her with headache/fever with meningitis (CSF fluid: WBC= 880, neutrophils 54, glucose 33, protein 33) to begin  vancomycin and acyclovir (patient noted on fortaz). WBC= 9.4, tmax= 101.9, SCr= 1.08 and CrCl ~ 55. Vancomycin 1000mg  was given in the ED at about 7pm.  BMI noted as 32.3, ideal body weight= 47.8kg.  2/17 fortaz 2/17 vancomycin 2/17 acyclovir  2/17 CSF 2/17 blood x2 2/17 urine  Goal of Therapy:  Vancomycin trough level 15-20 mcg/ml  Plan -Vancomycin 500mg  IV x1 (to complete a total load of 1500mg ) followed by 750mg  IV q12h -Acyclovir 500mg  (~ 10mg /kg/dose; dose based on IBW with obesity) IV q8h  -Will follow renal function, cultures and clinical progress  Hildred Laser, Pharm D 08/22/2014 8:54 PM        Plan:

## 2014-08-22 NOTE — Progress Notes (Signed)
Report received at 1. Room ready for admission.

## 2014-08-22 NOTE — ED Notes (Signed)
Ward, MD aware that only x 1 set of cultures can be obtained d/t difficult stick

## 2014-08-22 NOTE — ED Notes (Signed)
Doppler BP 68 charted in error

## 2014-08-22 NOTE — ED Notes (Signed)
Pt having HA and fever x 3 days. sts tylenol at 8 am. sts some nausea. And stiff neck.

## 2014-08-22 NOTE — Telephone Encounter (Signed)
Patient brought in short term disability to be filled out. She put on her form for [place] first day out 2/8 returning to work 3/8 but no work note is in system for these days out of work. I just need to verify that this is correct before I start filling out form.

## 2014-08-22 NOTE — H&P (Signed)
History and Physical  [name] [name] ZOX:[id number] DOB: 07-18-1960 DOA: 08/22/2014   PCP: Harlow Asa, MD   Chief Complaint: headache and fever  HPI:  54 year old female with a history of Chiari I malformation, hyperlipidemia, GERD, presents with three-day history of fever and headaches. The patient states that she normally has chronic headaches  which she previously treated to her prematurity one malformation. The patient underwent a suboccipital craniectomy, C1 laminectomy, and duraplasty on 05/10/2014 performed by Dr. Newell Coral. Since that period of time, the patient's suboccipital headaches have improved, but her bifrontal headaches have returned. Patient has seen a headache specialist and her headaches have been attributed to chronic tension headaches. However for the past 3 days, the patient developed fevers up to 100.98F at  Home. As a result, the patient presented to the emergency department. Initial workup revealed chest x-ray that suggested left lower lobe atelectasis versus infiltrate. The patient was recently treated for pneumonia initially. But her headache persisted. Lumbar punch was performed. Cervical spinal fluid in tube 1 showed WBC 880 (54%N, 45%Monos), glucose 33, protein 33, RBC 1. The patient was subsequent was started on vancomycin and ampicillin. She complains of some nausea but denies any vomiting, abdominal pain, dysuria, hematuria, chest discomfort, shortness of breath, coughing, hemoptysis.  The patient denies any recent sick contacts. She denies any recent travels or overuse of NSAIDs although she chronically takes Celebrex once to twice per week for muscular skeletal pain. WBC was 9.4, hemoglobin 13.1, platelet 181,000. Lactic is 1.08. Gram stain from CSF was negative. Assessment/Plan: Acute meningitis -Cell count suggest likely bacterial etiology -As discussed, patient had suboccipital craniectomy 05/10/2014 -Empiric coverage with vancomycin, ceftazidime pending  culture data -Discontinue ampicillin -Very low clinical suspicion for HSV at this time will continue acyclovir pending clinical response as the patient has given a history of "recurrent herpes infection" -Add to  CSF studies--VDRL, HSV PCR, cryptococcus antigen, AFB culture -check HIV antibody -Influenza PCR negative -IVF -oral opioids for now -Quantiferon TB Abnormal chest x-ray -Will not treat for PNA -clinically pt does not have PNA Hypertension -Hold atenolol as blood pressure soft Depression -continue alprazolam and celexa Hx of Breast Cancer -continue Arimidex       Past Medical History  Diagnosis Date  . Chest pain 2009    Consultation-[facility], negative chest CT; nl echo in 2005; h/o palpitations  . Palpitations   . Degenerative joint disease     + degenerative joint disease of the lumbosacral spine  . Colitis 2010    not IBD  . Herpes simplex type II infection   . Allergic rhinitis   . Hypercholesteremia     "slightly high"  . Anxiety   . Heart murmur     "small"  . GERD (gastroesophageal reflux disease)     "a little"  . Depression   . Anemia, unspecified 05/30/2013  . Wears glasses   . Breast cancer 03/31/13    left  . Cancer   . Dysrhythmia     palpations  . Hypertension     does not have high blood pressure  . Headache   . Meningitis 08/22/2014   Past Surgical History  Procedure Laterality Date  . Colonoscopy  10/2010    proctitis; melanosis coli  . Tubal ligation    . Breast excisional biopsy  04/2003    Left; benign disease  . Right oophorectomy  03/13/2007  . Abdominal hysterectomy  03/13/2007    TAH ?BSO--Dr Cheron Every, [place]  .  Eye surgery Left     "fix lazy eye"  . Portacath placement Right 04/21/2013    Procedure: INSERTION PORT-A-CATH;  Surgeon: Kandis Cocking, MD;  Location: [facility];  Service: General;  Laterality: Right;  . Breast lumpectomy with needle localization and axillary lymph node dissection Left 09/12/2013    Procedure:  BREAST LUMPECTOMY WITH NEEDLE LOCALIZATION AND AXILLARY LYMPH NODE DISSECTION;  Surgeon: Kandis Cocking, MD;  Location: [facility];  Service: General;  Laterality: Left;  and axilla  . Breast surgery    . Suboccipital craniectomy cervical laminectomy N/A 05/10/2014    Procedure: SUBOCCIPITAL CRANIECTOMY CERVICAL LAMINECTOMY/DURAPLASTY;  Surgeon: Hewitt Shorts, MD;  Location: [facility];  Service: Neurosurgery;  Laterality: N/A;  suboccipital craniectomy with cervical laminectomy and duraplasty   Social History:  reports that she quit smoking about 32 years ago. Her smoking use included Cigarettes. She has a 7.5 pack-year smoking history. She has never used smokeless tobacco. She reports that she does not drink alcohol or use illicit drugs.   Family History  Problem Relation Age of Onset  . Aneurysm Mother     Cerebral  . Hypertension Mother   . Hyperlipidemia Mother   . Stroke Mother   . Coronary artery disease Father   . Hypertension Father   . Hyperlipidemia Father   . Lung cancer Brother   . Cancer Brother     Lung     Allergies  Allergen Reactions  . Doxycycline Other (See Comments)    Unknown      Prior to Admission medications   Medication Sig Start Date End Date Taking? Authorizing Provider  acetaminophen (TYLENOL) 500 MG tablet Take 500 mg by mouth every 6 (six) hours as needed for moderate pain.    Yes Historical Provider, MD  ALPRAZolam Prudy Feeler) 1 MG tablet Take 1 mg by mouth at bedtime.   Yes Historical Provider, MD  anastrozole (ARIMIDEX) 1 MG tablet Take 1 tablet (1 mg total) by mouth daily. 12/08/13  Yes Lowella Dell, MD  atenolol (TENORMIN) 50 MG tablet TAKE 1 TABLET BY MOUTH DAILY 07/30/14  Yes Merlyn Albert, MD  celecoxib (CELEBREX) 200 MG capsule Take 200 mg by mouth daily as needed for mild pain.  02/19/14  Yes Historical Provider, MD  Cholecalciferol (VITAMIN D PO) Take 1 tablet by mouth daily.   Yes Historical Provider, MD    diphenhydramine-acetaminophen (TYLENOL PM) 25-500 MG TABS Take 2 tablets by mouth at bedtime.   Yes Historical Provider, MD  escitalopram (LEXAPRO) 20 MG tablet Take 1 tablet (20 mg total) by mouth daily. 08/10/14  Yes Merlyn Albert, MD  pantoprazole (PROTONIX) 40 MG tablet Take 1 tablet (40 mg total) by mouth daily. Patient taking differently: Take 40 mg by mouth as needed.  02/23/14  Yes Campbell Riches, NP  valACYclovir (VALTREX) 1000 MG tablet Take 1,000 mg by mouth daily.   Yes Historical Provider, MD    Review of Systems:  Constitutional:  No weight loss, night sweats Head&Eyes: No headache.  No vision loss.  No eye pain or scotoma ENT:  No Difficulty swallowing,Tooth/dental problems,Sore throat,   Cardio-vascular:  No chest pain, Orthopnea, PND, swelling in lower extremities,  dizziness,  GI:  No  abdominal pain,  vomiting, diarrhea, loss of appetite, hematochezia, melena, heartburn, indigestion, Resp:  No shortness of breath with exertion or at rest. No cough. No coughing up of blood .No wheezing.No chest wall deformity  Skin:  no rash  or lesions.  GU:  no dysuria, change in color of urine, no urgency or frequency. No flank pain.  Musculoskeletal:  No joint pain or swelling. No decreased range of motion. No back pain.  Psych:  No change in mood or affect. Neurologic: no dysesthesia, no focal weakness, no vision loss. No syncope  Physical Exam: Filed Vitals:   08/22/14 1630 08/22/14 1707 08/22/14 1828 08/22/14 1830  BP: 93/59  113/64 98/67  Pulse:  67 67 66  Temp:      TempSrc:      Resp:  12 12 14   SpO2:  95% 99% 100%   General:  A&O x 3, NAD, nontoxic, pleasant/cooperative Head/Eye: No conjunctival hemorrhage, no icterus, [place]/AT, No nystagmus ENT:  No icterus,  No thrush, good dentition, no pharyngeal exudate Neck:  No masses, no lymphadenpathy, no bruits CV:  RRR, no rub, no gallop, no S3 Lung:  CTAB, good air movement, no wheeze, no rhonchi Abdomen:  soft/NT, +BS, nondistended, no peritoneal signs Ext: No cyanosis, No rashes, No petechiae, No lymphangitis, No edema Neuro: CNII-XII intact, strength 4/5 in bilateral upper and lower extremities, no dysmetria; suboccipital scar without any erythema, edema, induration, drainage  Labs on Admission:  Basic Metabolic Panel:  Recent Labs Lab 08/22/14 1138  NA 141  K 3.7  CL 102  CO2 29  GLUCOSE 77  BUN 10  CREATININE 1.08  CALCIUM 9.7   Liver Function Tests:  Recent Labs Lab 08/22/14 1138  AST 19  ALT 24  ALKPHOS 54  BILITOT 0.7  PROT 7.2  ALBUMIN 4.0   No results for input(s): LIPASE, AMYLASE in the last 168 hours. No results for input(s): AMMONIA in the last 168 hours. CBC:  Recent Labs Lab 08/22/14 1138  WBC 9.4  NEUTROABS 7.3  HGB 13.1  HCT 39.3  MCV 86.2  PLT 181   Cardiac Enzymes: No results for input(s): CKTOTAL, CKMB, CKMBINDEX, TROPONINI in the last 168 hours. BNP: Invalid input(s): POCBNP CBG: No results for input(s): GLUCAP in the last 168 hours.  Radiological Exams on Admission: Dg Chest 2 View  08/22/2014   CLINICAL DATA:  Fever. Headache of 1 day duration. Personal history of breast cancer.  EXAM: CHEST  2 VIEW  COMPARISON:  03/09/2014  FINDINGS: Previously seen Port-A-Cath has been removed. Heart size is normal. Mediastinal shadows are normal. The right lung is clear. Left hemidiaphragm is slightly elevated in there is mild density at the left base that could indicate mild atelectasis and/or pneumonia. No effusions. No bony finding. Surgical clips in the left axilla.  IMPRESSION: Mild volume loss and pulmonary density at the left base suggesting mild pneumonia.   Electronically Signed   By: Paulina Fusi M.D.   On: 08/22/2014 16:14   Ct Head Wo Contrast  08/22/2014   CLINICAL DATA:  Headache and fever  EXAM: CT HEAD WITHOUT CONTRAST  TECHNIQUE: Contiguous axial images were obtained from the base of the skull through the vertex without intravenous  contrast.  COMPARISON:  Brain MRI 04/05/2014  FINDINGS: Skull and Sinuses:Suboccipital decompression which is new from 04/05/2014. There is a CSF density collection at the calvarial defect, stable from recent MRA. No surrounding fat infiltration to suggest inflammation or leakage.  Chronic opacification of air cells in the sclerotic mastoid tips consistent with chronic mastoiditis.  Orbits: No acute abnormality.  Brain: No evidence of acute infarction, hemorrhage, obstructive hydrocephalus, or mass lesion/mass effect. There has been interval decompression of the low cerebellar tonsils.  IMPRESSION:  1. No acute intracranial findings. 2. Interval suboccipital decompression with chronic fluid collection/pseudomeningocele. 3. Chronic mastoiditis.   Electronically Signed   By: Marnee Spring M.D.   On: 08/22/2014 14:30        Time spent:60 minutes Code Status:   FULL Family Communication:   Husband updated at bedside   Khoury Siemon, DO  [facility] Pager [phone]  If 7PM-7AM, please contact night-coverage [email or web address] Password [facility] 08/22/2014, 6:58 PM

## 2014-08-22 NOTE — Telephone Encounter (Signed)
Generally [name] [name] i dictate a very thorough note to justifyexcuse but i do not write the exact date of ret in my progress note, but usually disch instructions,

## 2014-08-22 NOTE — ED Provider Notes (Addendum)
  Physical Exam  BP 93/59 mmHg  Pulse 67  Temp(Src) 98.5 F (36.9 C) (Oral)  Resp 12  SpO2 95%  LMP 12/04/2008  Physical Exam  ED Course  Procedures  MDM  Pt comes in with cc of headaches. Noted to have a fever. Fevers appears to be due to CAP. Pt has chronic headache - and seems like headache got better at some point. However, she has some signs of meningismus and headaches got severe again whilst waiting for workup - and so LP was done. Pt has gram stain from CSF shows WBCs. Pt will get meningitis coverage. Pt will likely need admission. I will be waiting for CSF results.   Varney Biles, MD 08/22/14 1724  Pt has elevated WBC in the CSF, low glucose. She will be admitted for meningitis. Ampicillin added.  Varney Biles, MD 08/22/14 2094  Varney Biles, MD 08/22/14 7096

## 2014-08-22 NOTE — ED Notes (Addendum)
IV team at bedside 

## 2014-08-23 DIAGNOSIS — Z8669 Personal history of other diseases of the nervous system and sense organs: Secondary | ICD-10-CM

## 2014-08-23 DIAGNOSIS — Z9889 Other specified postprocedural states: Secondary | ICD-10-CM

## 2014-08-23 DIAGNOSIS — R938 Abnormal findings on diagnostic imaging of other specified body structures: Secondary | ICD-10-CM

## 2014-08-23 DIAGNOSIS — R011 Cardiac murmur, unspecified: Secondary | ICD-10-CM | POA: Diagnosis present

## 2014-08-23 DIAGNOSIS — G009 Bacterial meningitis, unspecified: Secondary | ICD-10-CM

## 2014-08-23 DIAGNOSIS — F329 Major depressive disorder, single episode, unspecified: Secondary | ICD-10-CM | POA: Diagnosis present

## 2014-08-23 DIAGNOSIS — E7800 Pure hypercholesterolemia, unspecified: Secondary | ICD-10-CM | POA: Diagnosis present

## 2014-08-23 DIAGNOSIS — C50912 Malignant neoplasm of unspecified site of left female breast: Secondary | ICD-10-CM

## 2014-08-23 DIAGNOSIS — F419 Anxiety disorder, unspecified: Secondary | ICD-10-CM | POA: Diagnosis present

## 2014-08-23 DIAGNOSIS — F32A Depression, unspecified: Secondary | ICD-10-CM | POA: Diagnosis present

## 2014-08-23 LAB — BASIC METABOLIC PANEL
Anion gap: 6 (ref 5–15)
BUN: 10 mg/dL (ref 6–23)
CO2: 28 mmol/L (ref 19–32)
Calcium: 8.8 mg/dL (ref 8.4–10.5)
Chloride: 105 mmol/L (ref 96–112)
Creatinine, Ser: 0.96 mg/dL (ref 0.50–1.10)
GFR calc Af Amer: 77 mL/min — ABNORMAL LOW (ref >90)
GFR calc non Af Amer: 66 mL/min — ABNORMAL LOW (ref >90)
Glucose, Bld: 100 mg/dL — ABNORMAL HIGH (ref 70–99)
Potassium: 3.6 mmol/L (ref 3.5–5.1)
Sodium: 139 mmol/L (ref 135–145)

## 2014-08-23 LAB — PATHOLOGIST SMEAR REVIEW: Path Review: INCREASED

## 2014-08-23 LAB — CRYPTOCOCCAL ANTIGEN: Crypto Ag: NEGATIVE

## 2014-08-23 LAB — URINE CULTURE: Colony Count: 50000

## 2014-08-23 NOTE — Progress Notes (Addendum)
PROGRESS NOTE    [name] [name] NOM:[id number] DOB: 25-May-1961 DOA: 08/22/2014 PCP: Rubbie Battiest, MD  HPI/Brief narrative 54 y/o female s/p Chiari decompression, including suboccipital craniectomy, upper cervical laminectomy, and duraplasty on 05/10/2014, anxiety, depression, chronic headaches, recently diagnosed tension headache (Dr. Melton Alar), GERD, HLD, presented with 3 day history of fevers up to 100.79F and worsening frontal headaches. Lumbar puncture in ED showed 880 WBC (50% neutrophils and 45% monocytes), glucose 33, protein 33 and RBC 1. She was admitted for possible acute bacterial meningitis. CSF Gram stain negative for organisms. Infectious disease consulted 2/18.   Assessment/Plan:  1. Acute bacterial meningitis:? Partially treated. Follow CSF culture results. Cryptococcal antigen negative. Continue empiric IV acyclovir, vancomycin and ceftazidime. Status post craniotomy 05/10/14. Headaches are better. Influenza panel PCR negative. Blood cultures 2: Pending/no growth to date. CSF culture: Negative to date. CT head: No acute findings. Patient does give history of recurrent herpes infection. 2. Abnormal chest x-ray/left base pulmonary density: No clinical pneumonia-not on treatment for same. 3. Essential hypertension: Blood pressures were soft and atenolol was held. Controlled. Consider restarting atenolol in a.m. 4. History of anxiety & depression: Continue alprazolam and Celexa. 5. History of breast cancer: Continue Arimidex 6. Tensions headaches: Follows with Dr. Melton Alar  Code Status: Full Family Communication: None at bedside Disposition Plan: Home when medically stable   Consultants:  Infectious disease  Procedures:  Lumbar puncture 08/22/14  Antibiotics:  IV ampicillin 2 g 1 dose 08/22/14  IV ceftriaxone 2 g 1 dose 08/22/14  IV acyclovir 08/22/14 >  IV ceftazidime 2/17 >  IV vancomycin 2/17 >  Subjective: Patient states that her headache and neck pain are  better. Fever subsided. Denies any other complaints.  Objective: Filed Vitals:   08/22/14 1958 08/22/14 2025 08/23/14 0530 08/23/14 0958  BP:  129/56 120/65 137/67  Pulse:  81 70 77  Temp: 98.7 F (37.1 C) 98.4 F (36.9 C) 98.2 F (36.8 C) 98 F (36.7 C)  TempSrc: Oral Oral Oral Oral  Resp:  16 13 18   Height:  5\' 1"  (1.549 m)    Weight:  77.883 kg (171 lb 11.2 oz)    SpO2:  99% 97% 98%    Intake/Output Summary (Last 24 hours) at 08/23/14 1346 Last data filed at 08/23/14 1300  Gross per 24 hour  Intake    600 ml  Output   1050 ml  Net   -450 ml   Filed Weights   08/22/14 2025  Weight: 77.883 kg (171 lb 11.2 oz)     Exam:  General exam: Pleasant middle-aged female sitting up comfortably in bed. Respiratory system: Clear. No increased work of breathing. Cardiovascular system: S1 & S2 heard, RRR. No JVD, murmurs, gallops, clicks or pedal edema. Telemetry: Sinus rhythm. Gastrointestinal system: Abdomen is nondistended, soft and nontender. Normal bowel sounds heard. Central nervous system: Alert and oriented. No focal neurological deficits. Extremities: Symmetric 5 x 5 power.   Data Reviewed: Basic Metabolic Panel:  Recent Labs Lab 08/22/14 1138 08/23/14 0643  NA 141 139  K 3.7 3.6  CL 102 105  CO2 29 28  GLUCOSE 77 100*  BUN 10 10  CREATININE 1.08 0.96  CALCIUM 9.7 8.8   Liver Function Tests:  Recent Labs Lab 08/22/14 1138  AST 19  ALT 24  ALKPHOS 54  BILITOT 0.7  PROT 7.2  ALBUMIN 4.0   No results for input(s): LIPASE, AMYLASE in the last 168 hours. No results for input(s): AMMONIA in the last 168  hours. CBC:  Recent Labs Lab 08/22/14 1138  WBC 9.4  NEUTROABS 7.3  HGB 13.1  HCT 39.3  MCV 86.2  PLT 181   Cardiac Enzymes: No results for input(s): CKTOTAL, CKMB, CKMBINDEX, TROPONINI in the last 168 hours. BNP (last 3 results) No results for input(s): PROBNP in the last 8760 hours. CBG: No results for input(s): GLUCAP in the last 168  hours.  Recent Results (from the past 240 hour(s))  Blood culture (routine x 2)     Status: None (Preliminary result)   Collection Time: 08/22/14  1:34 PM  Result Value Ref Range Status   Specimen Description BLOOD  Final   Special Requests BOTTLES DRAWN AEROBIC AND ANAEROBIC 5CC EJ  Final   Culture   Final           BLOOD CULTURE RECEIVED NO GROWTH TO DATE CULTURE WILL BE HELD FOR 5 DAYS BEFORE ISSUING A FINAL NEGATIVE REPORT Performed at [facility]    Report Status PENDING  Incomplete  CSF culture     Status: None (Preliminary result)   Collection Time: 08/22/14  3:47 PM  Result Value Ref Range Status   Specimen Description CSF  Final   Special Requests NONE  Final   Gram Stain   Final    CYTOSPIN SLIDE WBC PRESENT,BOTH PMN AND MONONUCLEAR NO ORGANISMS SEEN Performed at [facility]    Culture NO GROWTH Performed at [facility]   Final   Report Status PENDING  Incomplete  Gram stain - STAT with CSF culture     Status: None   Collection Time: 08/22/14  3:47 PM  Result Value Ref Range Status   Specimen Description CSF  Final   Special Requests NONE  Final   Gram Stain   Final    CYTOSPIN PREP WBC PRESENT,BOTH PMN AND MONONUCLEAR NO ORGANISMS SEEN    Report Status 08/22/2014 FINAL  Final  Blood culture (routine x 2)     Status: None (Preliminary result)   Collection Time: 08/22/14 10:13 PM  Result Value Ref Range Status   Specimen Description BLOOD RIGHT ARM  Final   Special Requests BOTTLES DRAWN AEROBIC ONLY 10CC  Final   Culture PENDING  Incomplete   Report Status PENDING  Incomplete           Studies: Dg Chest 2 View  08/22/2014   CLINICAL DATA:  Fever. Headache of 1 day duration. Personal history of breast cancer.  EXAM: CHEST  2 VIEW  COMPARISON:  03/09/2014  FINDINGS: Previously seen Port-A-Cath has been removed. Heart size is normal. Mediastinal shadows are normal. The right lung is clear. Left hemidiaphragm is slightly elevated  in there is mild density at the left base that could indicate mild atelectasis and/or pneumonia. No effusions. No bony finding. Surgical clips in the left axilla.  IMPRESSION: Mild volume loss and pulmonary density at the left base suggesting mild pneumonia.   Electronically Signed   By: Nelson Chimes M.D.   On: 08/22/2014 16:14   Ct Head Wo Contrast  08/22/2014   CLINICAL DATA:  Headache and fever  EXAM: CT HEAD WITHOUT CONTRAST  TECHNIQUE: Contiguous axial images were obtained from the base of the skull through the vertex without intravenous contrast.  COMPARISON:  Brain MRI 04/05/2014  FINDINGS: Skull and Sinuses:Suboccipital decompression which is new from 04/05/2014. There is a CSF density collection at the calvarial defect, stable from recent MRA. No surrounding fat infiltration to suggest inflammation or leakage.  Chronic opacification of air cells in the sclerotic mastoid tips consistent with chronic mastoiditis.  Orbits: No acute abnormality.  Brain: No evidence of acute infarction, hemorrhage, obstructive hydrocephalus, or mass lesion/mass effect. There has been interval decompression of the low cerebellar tonsils.  IMPRESSION: 1. No acute intracranial findings. 2. Interval suboccipital decompression with chronic fluid collection/pseudomeningocele. 3. Chronic mastoiditis.   Electronically Signed   By: Monte Fantasia M.D.   On: 08/22/2014 14:30        Scheduled Meds: . acyclovir  500 mg Intravenous 3 times per day  . ALPRAZolam  1 mg Oral QHS  . anastrozole  1 mg Oral Daily  . cefTAZidime (FORTAZ)  IV  2 g Intravenous 3 times per day  . cholecalciferol  1,000 Units Oral Daily  . enoxaparin (LOVENOX) injection  40 mg Subcutaneous Q24H  . escitalopram  20 mg Oral Daily  . sodium chloride  3 mL Intravenous Q12H  . vancomycin  750 mg Intravenous Q12H   Continuous Infusions: . sodium chloride 0.9 % 1,000 mL with potassium chloride 20 mEq infusion 75 mL/hr at 08/22/14 2302    Active  Problems:   Essential hypertension   Obesity (BMI 30-39.9)   Meningitis    Time spent: 40 minutes    HONGALGI,ANAND, MD, FACP, FHM. [facility] Pager [phone]  If 7PM-7AM, please contact night-coverage [email or web address] Password [email or web address] 08/23/2014, 1:46 PM    LOS: 1 day

## 2014-08-23 NOTE — Progress Notes (Signed)
Utilization Review Completed.[name] [name] T2/18/2016  

## 2014-08-23 NOTE — Consult Note (Signed)
[name]    Date of Admission:  08/22/2014            Day 1 vancomycin        Day 1 ceftazidime        Day 1 acyclovir        Reason for Consult: Meningitis    Referring Physician: Dr. Vernell Leep Primary Care Physician: Dr. Sallee Lange  Principal Problem:   Meningitis Active Problems:   Breast cancer   History of Chiari malformation   Essential hypertension   Obesity (BMI 30-39.9)   Depression   Anxiety   Heart murmur   Hypercholesteremia   . acyclovir  500 mg Intravenous 3 times per day  . ALPRAZolam  1 mg Oral QHS  . anastrozole  1 mg Oral Daily  . cefTAZidime (FORTAZ)  IV  2 g Intravenous 3 times per day  . cholecalciferol  1,000 Units Oral Daily  . enoxaparin (LOVENOX) injection  40 mg Subcutaneous Q24H  . escitalopram  20 mg Oral Daily  . sodium chloride  3 mL Intravenous Q12H  . vancomycin  750 mg Intravenous Q12H    Recommendations: 1. Continue current antimicrobial therapy pending final blood and CSF cultures 2. Await results of CSF herpes simplex PCR 3. See if there is another CSF available for cytologic analysis   Assessment: She has some type of meningitis. The white blood cell differential and slightly low glucose are nonspecific. She does not appear acutely ill and her history of only intermittent headaches worsening for the past month do not strongly suggest that this is bacterial meningitis or acute herpes meningitis. 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 I discussed her situation with Dr. Jovita Gamma. He states that sometimes patients who've undergone duraplasty can develop a postoperative chemical meningitis. Usually this is within the first 1-2 months postoperatively but this is still a possibility. Although it appears she has responded well to her lumpectomy and chemotherapy is also the remote possibility of  carcinomatous meningitis.    HPI: [name] [name] is a 55 y.o. female history of chronic headaches and Chiari I malformation. She underwent suboccipital craniectomy, C1-2 laminectomy and duraplasty on 05/10/2014 by Dr. Jovita Gamma. Her chronic posterior headaches improved postoperatively but she continued to have her chronic frontal headaches. She was referred for headache evaluation to Dr. Michel Santee and started on some new medications. Her headaches improved. She states that she was having headaches about 2-3 times each week. She rated her chronic postoperative headaches as about 3 out of 10. The headaches would improve with acetaminophen. She states that about one month ago her headaches started to become more severe. They're still occurring about 2-3 times each week but now are often 8 out of 10. They're most often frontal headaches and did not respond well to acetaminophen. Over the past 3-4 days she has also developed some fever and chills. She's had some nausea without vomiting. She also notes some generalized stiffness in her muscles and joints.  She was hospitalized last night. Her head CT did not reveal any acute abnormalities. Lumbar puncture revealed clear fluid. I do not see that opening pressure was recorded. CSF analysis showed 880 white blood cells with 54% segmented neutrophils and 40% lymphocytes. Her protein was normal at 33 but her glucose was slightly low at 33. No organisms were seen on  Gram stain and her culture is negative so far. Cryptococcal antigen is negative.  She underwent left breast lumpectomy last year and has been on anastrozole since July 2015 with apparent good response.   Review of Systems: Constitutional: positive for anorexia, chills, fevers and malaise, negative for sweats and weight loss Eyes: positive for blurred vision for about the past 8 months Ears, nose, mouth, throat, and face: negative Respiratory: negative Cardiovascular:  negative Gastrointestinal: positive for nausea, negative for abdominal pain, change in bowel habits, constipation, diarrhea and vomiting Genitourinary:negative Musculoskeletal:positive for stiffness in her muscles and joints recently, negative for arthralgias, back pain, muscle weakness and myalgias  Past Medical History  Diagnosis Date  . Chest pain 2009    Consultation-[facility], negative chest CT; nl echo in 2005; h/o palpitations  . Palpitations   . Degenerative joint disease     + degenerative joint disease of the lumbosacral spine  . Colitis 2010    not IBD  . Herpes simplex type II infection   . Allergic rhinitis   . Hypercholesteremia     "slightly high"  . Anxiety   . Heart murmur     "small"  . GERD (gastroesophageal reflux disease)     "a little"  . Depression   . Anemia, unspecified 05/30/2013  . Wears glasses   . Breast cancer 03/31/13    left  . Cancer   . Dysrhythmia     palpations  . Hypertension     does not have high blood pressure  . Headache   . 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 Smoking status: Former Smoker -- 0.50 packs/day for 15 years    Types: Cigarettes    Quit date: 07/06/1982  . Smokeless tobacco: Never Used  . Alcohol Use: No    Family History  Problem Relation Age of Onset  . Aneurysm Mother     Cerebral  . Hypertension Mother   . Hyperlipidemia Mother   . Stroke Mother   . Coronary artery disease Father   . Hypertension Father   . Hyperlipidemia Father   . Lung cancer Brother   . Cancer Brother     Lung   Allergies  Allergen Reactions  . Doxycycline Other (See Comments)    Unknown    OBJECTIVE: Blood pressure 137/67, pulse 77, temperature 98 F (36.7 C), temperature source Oral, resp. rate 18, height 5\' 1"  (1.549 m), weight 171 lb 11.2 oz (77.883 kg), last menstrual period 12/04/2008, SpO2 98 %. General: She is alert and appears comfortable and in no distress. She is watching television. Skin: No  rash Lymph nodes: No palpable adenopathy Eyes: Normal external exam Sinuses: No sinus or mastoid tenderness Oral: No oropharyngeal lesions. Teeth are in good condition. Lungs: Clear  Cor: Regular S1 and S2 with a 8-3/1 early systolic murmur heard best at the right upper sternal border Abdomen: Soft and nontender with no palpable masses Joints and extremities: No acute abnormalities noted  Lab Results Lab Results  Component Value Date   WBC 9.4 08/22/2014   HGB 13.1 08/22/2014   HCT 39.3 08/22/2014   MCV 86.2 08/22/2014   PLT 181 08/22/2014    Lab Results  Component Value Date   CREATININE 0.96 08/23/2014   BUN 10 08/23/2014   NA 139 08/23/2014   K 3.6 08/23/2014   CL 105 08/23/2014   CO2 28 08/23/2014    Lab Results  Component Value Date   ALT 24 08/22/2014  AST 19 08/22/2014   ALKPHOS 54 08/22/2014   BILITOT 0.7 08/22/2014     Microbiology: Recent Results (from the past 240 hour(s))  Blood culture (routine x 2)     Status: None (Preliminary result)   Collection Time: 08/22/14  1:34 PM  Result Value Ref Range Status   Specimen Description BLOOD  Final   Special Requests BOTTLES DRAWN AEROBIC AND ANAEROBIC 5CC EJ  Final   Culture   Final           BLOOD CULTURE RECEIVED NO GROWTH TO DATE CULTURE WILL BE HELD FOR 5 DAYS BEFORE ISSUING A FINAL NEGATIVE REPORT Performed at [facility]    Report Status PENDING  Incomplete  CSF culture     Status: None (Preliminary result)   Collection Time: 08/22/14  3:47 PM  Result Value Ref Range Status   Specimen Description CSF  Final   Special Requests NONE  Final   Gram Stain   Final    CYTOSPIN SLIDE WBC PRESENT,BOTH PMN AND MONONUCLEAR NO ORGANISMS SEEN Performed at [facility]    Culture NO GROWTH Performed at [facility]   Final   Report Status PENDING  Incomplete  Gram stain - STAT with CSF culture     Status: None   Collection Time: 08/22/14  3:47 PM  Result Value Ref Range Status    Specimen Description CSF  Final   Special Requests NONE  Final   Gram Stain   Final    CYTOSPIN PREP WBC PRESENT,BOTH PMN AND MONONUCLEAR NO ORGANISMS SEEN    Report Status 08/22/2014 FINAL  Final  Blood culture (routine x 2)     Status: None (Preliminary result)   Collection Time: 08/22/14 10:13 PM  Result Value Ref Range Status   Specimen Description BLOOD RIGHT ARM  Final   Special Requests BOTTLES DRAWN AEROBIC ONLY 10CC  Final   Culture PENDING  Incomplete   Report Status PENDING  Incomplete    Michel Bickers, MD [facility] [phone] pager   [phone] cell 08/23/2014, 3:21 PM

## 2014-08-24 DIAGNOSIS — F419 Anxiety disorder, unspecified: Secondary | ICD-10-CM

## 2014-08-24 DIAGNOSIS — F329 Major depressive disorder, single episode, unspecified: Secondary | ICD-10-CM

## 2014-08-24 DIAGNOSIS — R51 Headache: Secondary | ICD-10-CM

## 2014-08-24 LAB — HERPES SIMPLEX VIRUS(HSV) DNA BY PCR
HSV 1 DNA: NEGATIVE
HSV 2 DNA: NEGATIVE

## 2014-08-24 LAB — VDRL, CSF: VDRL Quant, CSF: NONREACTIVE

## 2014-08-24 LAB — HIV ANTIBODY (ROUTINE TESTING W REFLEX): HIV Screen 4th Generation wRfx: NONREACTIVE

## 2014-08-24 MED ORDER — PANTOPRAZOLE SODIUM 40 MG PO TBEC: 40.0000 mg | DELAYED_RELEASE_TABLET | ORAL | Status: DC | PRN

## 2014-08-24 NOTE — Discharge Summary (Signed)
Physician Discharge Summary  [name] [name] AQT:[id number] DOB: 07-10-1960 DOA: 08/22/2014  PCP: Rubbie Battiest, MD  Admit date: 08/22/2014 Discharge date: 08/24/2014  Time spent: Less than 30 minutes  Recommendations for Outpatient Follow-up:  1. Dr. Sallee Lange, PCP in 5 days.  Discharge Diagnoses:  Principal Problem:   Meningitis Active Problems:   Essential hypertension   Obesity (BMI 30-39.9)   Breast cancer   Depression   Anxiety   Heart murmur   Hypercholesteremia   History of Chiari malformation   Discharge Condition: Improved & Stable  Diet recommendation: Heart Healthy diet.  Filed Weights   08/22/14 2025 08/23/14 2330  Weight: 77.883 kg (171 lb 11.2 oz) 78.74 kg (173 lb 9.4 oz)    History of present illness:  54 y/o female s/p Chiari decompression, including suboccipital craniectomy, upper cervical laminectomy, and duraplasty on 05/10/2014, anxiety, depression, chronic headaches, recently diagnosed tension headache (Dr. Melton Alar), GERD, HLD, presented with 3 day history of fevers up to 100.8F and worsening frontal headaches. Lumbar puncture in ED showed 880 WBC (50% neutrophils and 45% monocytes), glucose 33, protein 33 and RBC 1. She was admitted for possible acute bacterial meningitis. CSF Gram stain negative for organisms. Infectious disease consulted 2/18.  Hospital Course:   1. Headache: Patient presented with worsening headache and low-grade fever. CT head showed no acute findings. Due to concern for meningitis, patient underwent lumbar puncture with results as below. She was empirically started on IV acyclovir, vancomycin and Fortaz. Her CSF findings were not typical for acute bacterial meningitis. Also patient had recent craniotomy in November 2015. Thereby infectious disease was consulted. Dr. Megan Salon saw patient yesterday and today and indicates that the fact that her headaches have been worse over the past month and that they are not continuous suggest that she  does not have an infectious meningitis. He has discontinued all empiric antimicrobial therapy. He recommends that if her worsened headaches continue and all cultures, PCR and cytology are negative, would consider brief steroid trial for possible chemical meningitis related to her recent duraplasty. He discussed with Dr. Sherwood Gambler, neurosurgery yesterday who indicated that sometimes patients who have undergone duraplasty can developed a postoperative chemical meningitis, usually within the first 1-2 months postoperatively but this is still a possibility. Although it appears she has responded well to her lumpectomy and chemotherapy, carcinomatous meningitis was on the differential. Pathology smear reading of CSF shows increased neutrophils and lymphocytes. Dr. Megan Salon will follow-up on outstanding culture results. Patient cleared for discharge home. Cryptococcal antigen negative. Influenza panel PCR negative. Blood cultures 2: Pending/no growth to date. CSF culture: Negative to date. CT head: No acute findings.  2. Abnormal chest x-ray/left base pulmonary density: No clinical pneumonia-not on treatment for same. 3. Essential hypertension: Blood pressures were soft. Controlled. Patient states that she takes atenolol for palpitations and not for hypertension. 4. History of anxiety & depression: Continue alprazolam and Celexa. 5. History of breast cancer: Continue Arimidex 6. Tensions headaches: Follows with Dr. Melton Alar  Consultations:  Infectious disease  Procedures:  Lumbar puncture 08/22/14    Discharge Exam:  Complaints: Patient clearly states that her headache has resolved today.   Filed Vitals:   08/23/14 1801 08/23/14 2330 08/24/14 0427 08/24/14 1031  BP: 115/62 92/42 102/44 116/60  Pulse: 74 70 63 70  Temp: 98 F (36.7 C) 98 F (36.7 C) 97.8 F (36.6 C) 97.8 F (36.6 C)  TempSrc: Oral Oral Oral Oral  Resp: 18 17 17 18   Height:  5\' 1"  (1.549  m)    Weight:  78.74 kg (173 lb 9.4 oz)     SpO2: 98% 98% 100% 100%   General exam: Pleasant middle-aged female sitting up comfortably in bed. Neck supple. Respiratory system: Clear. No increased work of breathing. Cardiovascular system: S1 & S2 heard, RRR. No JVD, murmurs, gallops, clicks or pedal edema.  Gastrointestinal system: Abdomen is nondistended, soft and nontender. Normal bowel sounds heard. Central nervous system: Alert and oriented. No focal neurological deficits. Extremities: Symmetric 5 x 5 power.  Discharge Instructions      Discharge Instructions    Call MD for:  extreme fatigue    Complete by:  As directed      Call MD for:  persistant dizziness or light-headedness    Complete by:  As directed      Call MD for:  severe uncontrolled pain    Complete by:  As directed      Call MD for:  temperature >100.4    Complete by:  As directed      Diet - low sodium heart healthy    Complete by:  As directed      Increase activity slowly    Complete by:  As directed             Medication List    TAKE these medications        acetaminophen 500 MG tablet  Commonly known as:  TYLENOL  Take 500 mg by mouth every 6 (six) hours as needed for moderate pain.     ALPRAZolam 1 MG tablet  Commonly known as:  XANAX  Take 1 mg by mouth at bedtime.     anastrozole 1 MG tablet  Commonly known as:  ARIMIDEX  Take 1 tablet (1 mg total) by mouth daily.     atenolol 50 MG tablet  Commonly known as:  TENORMIN  TAKE 1 TABLET BY MOUTH DAILY     celecoxib 200 MG capsule  Commonly known as:  CELEBREX  Take 200 mg by mouth daily as needed for mild pain.     diphenhydramine-acetaminophen 25-500 MG Tabs  Commonly known as:  TYLENOL PM  Take 2 tablets by mouth at bedtime.     escitalopram 20 MG tablet  Commonly known as:  LEXAPRO  Take 1 tablet (20 mg total) by mouth daily.     pantoprazole 40 MG tablet  Commonly known as:  PROTONIX  Take 1 tablet (40 mg total) by mouth as needed.     valACYclovir 1000 MG tablet   Commonly known as:  VALTREX  Take 1,000 mg by mouth daily.     VITAMIN D PO  Take 1 tablet by mouth daily.       Follow-up Information    Follow up with Rubbie Battiest, MD. Schedule an appointment as soon as possible for a visit in 5 days.   Specialty:  Family Medicine   Contact information:   [address] [place] [address] [phone]        The results of significant diagnostics from this hospitalization (including imaging, microbiology, ancillary and laboratory) are listed below for reference.    Significant Diagnostic Studies: Dg Chest 2 View  08/22/2014   CLINICAL DATA:  Fever. Headache of 1 day duration. Personal history of breast cancer.  EXAM: CHEST  2 VIEW  COMPARISON:  03/09/2014  FINDINGS: Previously seen Port-A-Cath has been removed. Heart size is normal. Mediastinal shadows are normal. The right lung is clear. Left hemidiaphragm  is slightly elevated in there is mild density at the left base that could indicate mild atelectasis and/or pneumonia. No effusions. No bony finding. Surgical clips in the left axilla.  IMPRESSION: Mild volume loss and pulmonary density at the left base suggesting mild pneumonia.   Electronically Signed   By: Nelson Chimes M.D.   On: 08/22/2014 16:14   Ct Head Wo Contrast  08/22/2014   CLINICAL DATA:  Headache and fever  EXAM: CT HEAD WITHOUT CONTRAST  TECHNIQUE: Contiguous axial images were obtained from the base of the skull through the vertex without intravenous contrast.  COMPARISON:  Brain MRI 04/05/2014  FINDINGS: Skull and Sinuses:Suboccipital decompression which is new from 04/05/2014. There is a CSF density collection at the calvarial defect, stable from recent MRA. No surrounding fat infiltration to suggest inflammation or leakage.  Chronic opacification of air cells in the sclerotic mastoid tips consistent with chronic mastoiditis.  Orbits: No acute abnormality.  Brain: No evidence of acute infarction, hemorrhage, obstructive  hydrocephalus, or mass lesion/mass effect. There has been interval decompression of the low cerebellar tonsils.  IMPRESSION: 1. No acute intracranial findings. 2. Interval suboccipital decompression with chronic fluid collection/pseudomeningocele. 3. Chronic mastoiditis.   Electronically Signed   By: Monte Fantasia M.D.   On: 08/22/2014 14:30   Mr Jodene Nam Head Wo Contrast  08/13/2014   CLINICAL DATA:  History of breast cancer. Previous surgery for Chiari malformation. Continued headaches and blurred vision. Family history of aneurysm.  EXAM: MRA HEAD WITHOUT CONTRAST  TECHNIQUE: Angiographic images of the Circle of Willis were obtained using MRA technique without intravenous contrast.  COMPARISON:  MRI cervical spine 04/26/2014.  MRI brain 04/05/2014.  FINDINGS: Mildly motion degraded exam.  Overall study diagnostic.  There is mild non stenotic irregularity in the petrous and cavernous RIGHT internal carotid artery. Supraclinoid ICA widely patent.  Mild non stenotic irregularity is similarly noted in the petrous and cavernous LEFT ICA. Mild irregularity of the ICA terminus on the LEFT.  50% stenosis of the proximal basilar artery. RIGHT vertebral dominant.  LEFT vertebral is severely diseased throughout its V4 segment, greatest distally. The LEFT vertebral is overall smaller, but patent in its visualized V3 segment. This vessel does not appear to contribute significantly to the basilar.  Minor non stenotic irregularity of the proximal A1 and M1 MCA. Similar non stenotic irregularity, slightly more prominent of the LEFT A1 ACA, not clearly flow reducing. Duplicated LEFT MCA. No discrete LEFT M1 segment.  Unremarkable BILATERAL posterior cerebral arteries. No cerebellar branch occlusion.  Intracranial branch points were carefully examined and demonstrate no visible berry aneurysm.  IMPRESSION: No intracranial aneurysm.  Intracranial atherosclerotic change as described above.   Electronically Signed   By: Rolla Flatten  M.D.   On: 08/13/2014 09:33    Microbiology: Recent Results (from the past 240 hour(s))  Urine culture     Status: None   Collection Time: 08/22/14  1:09 PM  Result Value Ref Range Status   Specimen Description URINE, RANDOM  Final   Special Requests NONE  Final   Colony Count   Final    50,000 COLONIES/ML Performed at [facility]    Culture   Final    Multiple bacterial morphotypes present, none predominant. Suggest appropriate recollection if clinically indicated. Performed at [facility]    Report Status 08/23/2014 FINAL  Final  Blood culture (routine x 2)     Status: None (Preliminary result)   Collection Time: 08/22/14  1:34 PM  Result Value Ref Range Status   Specimen Description BLOOD  Final   Special Requests BOTTLES DRAWN AEROBIC AND ANAEROBIC 5CC EJ  Final   Culture   Final           BLOOD CULTURE RECEIVED NO GROWTH TO DATE CULTURE WILL BE HELD FOR 5 DAYS BEFORE ISSUING A FINAL NEGATIVE REPORT Performed at [facility]    Report Status PENDING  Incomplete  CSF culture     Status: None (Preliminary result)   Collection Time: 08/22/14  3:47 PM  Result Value Ref Range Status   Specimen Description CSF  Final   Special Requests NONE  Final   Gram Stain   Final    CYTOSPIN SLIDE WBC PRESENT,BOTH PMN AND MONONUCLEAR NO ORGANISMS SEEN Performed at [facility]    Culture   Final    NO GROWTH 1 DAY Performed at [facility]    Report Status PENDING  Incomplete  Gram stain - STAT with CSF culture     Status: None   Collection Time: 08/22/14  3:47 PM  Result Value Ref Range Status   Specimen Description CSF  Final   Special Requests NONE  Final   Gram Stain   Final    CYTOSPIN PREP WBC PRESENT,BOTH PMN AND MONONUCLEAR NO ORGANISMS SEEN    Report Status 08/22/2014 FINAL  Final  AFB culture with smear     Status: None (Preliminary result)   Collection Time: 08/22/14  3:47 PM  Result Value Ref Range Status   Specimen  Description CSF  Final   Special Requests ADDED [id number]  Final   Acid Fast Smear   Final    NO ACID FAST BACILLI SEEN Performed at [facility]    Culture   Final    CULTURE WILL BE EXAMINED FOR 6 WEEKS BEFORE ISSUING A FINAL REPORT Performed at [facility]    Report Status PENDING  Incomplete  Blood culture (routine x 2)     Status: None (Preliminary result)   Collection Time: 08/22/14 10:13 PM  Result Value Ref Range Status   Specimen Description BLOOD RIGHT ARM  Final   Special Requests BOTTLES DRAWN AEROBIC ONLY 10CC  Final   Culture   Final           BLOOD CULTURE RECEIVED NO GROWTH TO DATE CULTURE WILL BE HELD FOR 5 DAYS BEFORE ISSUING A FINAL NEGATIVE REPORT Note: Culture results may be compromised due to an excessive volume of blood received in culture bottles. Performed at [facility]    Report Status PENDING  Incomplete     Labs: Basic Metabolic Panel:  Recent Labs Lab 08/22/14 1138 08/23/14 0643  NA 141 139  K 3.7 3.6  CL 102 105  CO2 29 28  GLUCOSE 77 100*  BUN 10 10  CREATININE 1.08 0.96  CALCIUM 9.7 8.8   Liver Function Tests:  Recent Labs Lab 08/22/14 1138  AST 19  ALT 24  ALKPHOS 54  BILITOT 0.7  PROT 7.2  ALBUMIN 4.0   No results for input(s): LIPASE, AMYLASE in the last 168 hours. No results for input(s): AMMONIA in the last 168 hours. CBC:  Recent Labs Lab 08/22/14 1138  WBC 9.4  NEUTROABS 7.3  HGB 13.1  HCT 39.3  MCV 86.2  PLT 181   Cardiac Enzymes: No results for input(s): CKTOTAL, CKMB, CKMBINDEX, TROPONINI in the last 168 hours. BNP: BNP (last 3 results) No results for input(s):  BNP in the last 8760 hours.  ProBNP (last 3 results) No results for input(s): PROBNP in the last 8760 hours.  CBG: No results for input(s): GLUCAP in the last 168 hours.   Additional labs: 1. HIV antibody screen: Nonreactive 2. Cryptococcal antigen: Negative 3. Flu panel PCR: Negative 4. CSF VDRL:  Nonreactive 5. CSF 08/22/14: Glucose 33, total protein 31, RBC 1, WBC 880, neutrophils 54%, lymphocyte 40%, monocytes 5% 6. QuantiFERON TB goal testing: Pending 7. HSV DNA by PCR: Pending   Signed:  Vernell Leep, MD, FACP, FHM. [facility] Pager [phone]  If 7PM-7AM, please contact night-coverage [email or web address] Password [email or web address] 08/24/2014, 3:50 PM

## 2014-08-24 NOTE — Progress Notes (Signed)
Patient ID: [name] [name], female   DOB: 1961-04-19, 54 y.o.   MRN: [id number]         [place] for Infectious Disease    Date of Admission:  08/22/2014           Day 2 vancomycin        Day 2 ceftazidime        Day 2 acyclovir  Principal Problem:   Meningitis Active Problems:   Breast cancer   History of Chiari malformation   Essential hypertension   Obesity (BMI 30-39.9)   Depression   Anxiety   Heart murmur   Hypercholesteremia   . acyclovir  500 mg Intravenous 3 times per day  . ALPRAZolam  1 mg Oral QHS  . anastrozole  1 mg Oral Daily  . cefTAZidime (FORTAZ)  IV  2 g Intravenous 3 times per day  . cholecalciferol  1,000 Units Oral Daily  . enoxaparin (LOVENOX) injection  40 mg Subcutaneous Q24H  . escitalopram  20 mg Oral Daily  . sodium chloride  3 mL Intravenous Q12H  . vancomycin  750 mg Intravenous Q12H    Subjective: She states that she is feeling better today. Her headache yesterday afternoon was 8 out of 10 but she says it's only 1 out of 10 this morning. She is asking to be discharged home today. She reiterates that her headaches have been intermittent and worse over the past month. Her headaches occur 2-3 times per week. On other days she has no headache at all.  Review of Systems: Pertinent items are noted in HPI.  Past Medical History  Diagnosis Date  . Chest pain 2009    Consultation-Rothbart, negative chest CT; nl echo in 2005; h/o palpitations  . Palpitations   . Degenerative joint disease     + degenerative joint disease of the lumbosacral spine  . Colitis 2010    not IBD  . Herpes simplex type II infection   . Allergic rhinitis   . Hypercholesteremia     "slightly high"  . Anxiety   . Heart murmur     "small"  . GERD (gastroesophageal reflux disease)     "a little"  . Depression   . Anemia, unspecified 05/30/2013  . Wears glasses   . Breast cancer 03/31/13    left  . Cancer   . Dysrhythmia     palpations  . Hypertension       does not have high blood pressure  . Headache   . 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 Smoking status: Former Smoker -- 0.50 packs/day for 15 years    Types: Cigarettes    Quit date: 07/06/1982  . Smokeless tobacco: Never Used  . Alcohol Use: No    Family History  Problem Relation Age of Onset  . Aneurysm Mother     Cerebral  . Hypertension Mother   . Hyperlipidemia Mother   . Stroke Mother   . Coronary artery disease Father   . Hypertension Father   . Hyperlipidemia Father   . Lung cancer Brother   . Cancer Brother     Lung   Allergies  Allergen Reactions  . Doxycycline Other (See Comments)    Unknown    OBJECTIVE: Blood pressure 102/44, pulse 63, temperature 97.8 F (36.6 C), temperature source Oral, resp. rate 17, height 5\' 1"  (1.549 m), weight 173 lb 9.4 oz (78.74 kg), last menstrual period 12/04/2008, SpO2 100 %. General: She  is alert and in no distress walking in her room talking on her phone Skin: No rash  Neck: Supple  Lungs: Clear Cor: Regular S1 and S2 no murmur  Lab Results Lab Results  Component Value Date   WBC 9.4 08/22/2014   HGB 13.1 08/22/2014   HCT 39.3 08/22/2014   MCV 86.2 08/22/2014   PLT 181 08/22/2014    Lab Results  Component Value Date   CREATININE 0.96 08/23/2014   BUN 10 08/23/2014   NA 139 08/23/2014   K 3.6 08/23/2014   CL 105 08/23/2014   CO2 28 08/23/2014    Lab Results  Component Value Date   ALT 24 08/22/2014   AST 19 08/22/2014   ALKPHOS 54 08/22/2014   BILITOT 0.7 08/22/2014     Microbiology: Recent Results (from the past 240 hour(s))  Urine culture     Status: None   Collection Time: 08/22/14  1:09 PM  Result Value Ref Range Status   Specimen Description URINE, RANDOM  Final   Special Requests NONE  Final   Colony Count   Final    50,000 COLONIES/ML Performed at [facility]    Culture   Final    Multiple bacterial morphotypes present, none predominant. Suggest  appropriate recollection if clinically indicated. Performed at [facility]    Report Status 08/23/2014 FINAL  Final  Blood culture (routine x 2)     Status: None (Preliminary result)   Collection Time: 08/22/14  1:34 PM  Result Value Ref Range Status   Specimen Description BLOOD  Final   Special Requests BOTTLES DRAWN AEROBIC AND ANAEROBIC 5CC EJ  Final   Culture   Final           BLOOD CULTURE RECEIVED NO GROWTH TO DATE CULTURE WILL BE HELD FOR 5 DAYS BEFORE ISSUING A FINAL NEGATIVE REPORT Performed at [facility]    Report Status PENDING  Incomplete  CSF culture     Status: None (Preliminary result)   Collection Time: 08/22/14  3:47 PM  Result Value Ref Range Status   Specimen Description CSF  Final   Special Requests NONE  Final   Gram Stain   Final    CYTOSPIN SLIDE WBC PRESENT,BOTH PMN AND MONONUCLEAR NO ORGANISMS SEEN Performed at [facility]    Culture NO GROWTH Performed at [facility]   Final   Report Status PENDING  Incomplete  Gram stain - STAT with CSF culture     Status: None   Collection Time: 08/22/14  3:47 PM  Result Value Ref Range Status   Specimen Description CSF  Final   Special Requests NONE  Final   Gram Stain   Final    CYTOSPIN PREP WBC PRESENT,BOTH PMN AND MONONUCLEAR NO ORGANISMS SEEN    Report Status 08/22/2014 FINAL  Final  AFB culture with smear     Status: None (Preliminary result)   Collection Time: 08/22/14  3:47 PM  Result Value Ref Range Status   Specimen Description CSF  Final   Special Requests ADDED [id number]  Final   Acid Fast Smear   Final    NO ACID FAST BACILLI SEEN Performed at [facility]    Culture   Final    CULTURE WILL BE EXAMINED FOR 6 WEEKS BEFORE ISSUING A FINAL REPORT Performed at [facility]    Report Status PENDING  Incomplete  Blood culture (routine x 2)     Status: None (  Preliminary result)   Collection Time: 08/22/14 10:13 PM  Result Value Ref Range  Status   Specimen Description BLOOD RIGHT ARM  Final   Special Requests BOTTLES DRAWN AEROBIC ONLY 10CC  Final   Culture   Final           BLOOD CULTURE RECEIVED NO GROWTH TO DATE CULTURE WILL BE HELD FOR 5 DAYS BEFORE ISSUING A FINAL NEGATIVE REPORT Note: Culture results may be compromised due to an excessive volume of blood received in culture bottles. Performed at [facility]    Report Status PENDING  Incomplete    Assessment: The fact that her headaches have been worse over the past month and also the fact that they are not continuous suggest that she does not have infectious meningitis. I will stop her empiric antimicrobial therapy now. If her worsened headaches continue and all cultures, PCR and cytology are negative I would consider a brief steroid trial for possible chemical meningitis related to her recent duraplasty. I would be okay with discharge home today if she continues to do well.  Plan: 1. Discontinue vancomycin, ceftazidime and acyclovir  2.  please call Dr. Lita Mains [phone]) for any infectious disease questions this weekend  Michel Bickers, MD [facility] [phone] pager   [phone] cell 08/24/2014, 10:01 AM

## 2014-08-24 NOTE — Progress Notes (Signed)
Patient to be discharged to home with husband. PIV removed from right EJ. Discharge instructions reviewed with patient. No new medications.  Joellen Jersey, RN.

## 2014-08-25 LAB — QUANTIFERON IN TUBE
QFT TB AG MINUS NIL VALUE: 0 IU/mL
QUANTIFERON MITOGEN VALUE: 1.02 IU/mL
QUANTIFERON TB AG VALUE: 0.02 IU/mL
QUANTIFERON TB GOLD: NEGATIVE
Quantiferon Nil Value: 0.02 IU/mL

## 2014-08-25 LAB — QUANTIFERON TB GOLD ASSAY (BLOOD)

## 2014-08-26 LAB — CSF CULTURE W GRAM STAIN

## 2014-08-26 LAB — CSF CULTURE: Culture: NO GROWTH

## 2014-08-29 LAB — CULTURE, BLOOD (ROUTINE X 2)
Culture: NO GROWTH
Culture: NO GROWTH

## 2014-09-06 ENCOUNTER — Encounter: Payer: Self-pay | Admitting: Physical Therapy

## 2014-09-06 ENCOUNTER — Ambulatory Visit: Payer: BLUE CROSS/BLUE SHIELD | Attending: Neurology | Admitting: Physical Therapy

## 2014-09-06 DIAGNOSIS — M542 Cervicalgia: Secondary | ICD-10-CM | POA: Diagnosis not present

## 2014-09-06 NOTE — Therapy (Signed)
[facility] [address] [place], [place], [address] Phone: [phone]   Fax:  [phone]  Physical Therapy Treatment  Patient Details  Name: [name] [name] MRN: [id number] Date of Birth: [birth year] Referring Provider:  Merlyn Albert, MD  Encounter Date: 09/06/2014      PT End of Session - 09/06/14 0825    Visit Number 2   Number of Visits 12   Date for PT Re-Evaluation 09/27/14   PT Start Time 0731   PT Stop Time 0818   PT Time Calculation (min) 47 min   Activity Tolerance Patient tolerated treatment well   Behavior During Therapy [facility] for tasks assessed/performed      Past Medical History  Diagnosis Date  . Chest pain 2009    Consultation-Rothbart, negative chest CT; nl echo in 2005; h/o palpitations  . Palpitations   . Degenerative joint disease     + degenerative joint disease of the lumbosacral spine  . Colitis 2010    not IBD  . Herpes simplex type II infection   . Allergic rhinitis   . Hypercholesteremia     "slightly high"  . Anxiety   . Heart murmur     "small"  . GERD (gastroesophageal reflux disease)     "a little"  . Depression   . Anemia, unspecified 05/30/2013  . Wears glasses   . Breast cancer 03/31/13    left  . Cancer   . Dysrhythmia     palpations  . Hypertension     does not have high blood pressure  . Headache   . 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 Colonoscopy  10/2010    proctitis; melanosis coli  . Tubal ligation    . Breast excisional biopsy  04/2003    Left; benign disease  . Right oophorectomy  03/13/2007  . Abdominal hysterectomy  03/13/2007    TAH ?BSO--Dr Cheron Every, [place]  . Eye surgery Left     "fix lazy eye"  . Portacath placement Right 04/21/2013    Procedure: INSERTION PORT-A-CATH;  Surgeon: Kandis Cocking, MD;  Location: [facility];  Service: General;  Laterality: Right;  . Breast lumpectomy with needle localization and axillary  lymph node dissection Left 09/12/2013    Procedure: BREAST LUMPECTOMY WITH NEEDLE LOCALIZATION AND AXILLARY LYMPH NODE DISSECTION;  Surgeon: Kandis Cocking, MD;  Location: [facility];  Service: General;  Laterality: Left;  and axilla  . Breast surgery    . Suboccipital craniectomy cervical laminectomy N/A 05/10/2014    Procedure: SUBOCCIPITAL CRANIECTOMY CERVICAL LAMINECTOMY/DURAPLASTY;  Surgeon: Hewitt Shorts, MD;  Location: [facility];  Service: Neurosurgery;  Laterality: N/A;  suboccipital craniectomy with cervical laminectomy and duraplasty    LMP 12/04/2008  Visit Diagnosis:  Neck pain      Subjective Assessment - 09/06/14 0738    Symptoms no pain only stiffness in bil c-spine UT area   Currently in Pain? No/denies                    [facility] Adult PT Treatment/Exercise - 09/06/14 0001    Modalities   Modalities Electrical Stimulation;Moist Heat   Moist Heat Therapy   Number Minutes Moist Heat 15 Minutes   Moist Heat Location --  cervical   Electrical Stimulation   Electrical Stimulation Location Bil c-spine UT area   Electrical Stimulation Action premod   Electrical Stimulation Goals Pain   Manual Therapy  Manual Therapy Massage  gentle myofascial release to c-spine/UT area                PT Education - 09/06/14 0824    Education provided Yes   Education Details Posture/HEP   Person(s) Educated Patient   Methods Explanation;Demonstration;Handout   Comprehension Verbalized understanding;Returned demonstration             PT Long Term Goals - 09/06/14 0834    PT LONG TERM GOAL #1   Title demonstrate and/or verbalize techniques to reduce the risk of re-injury to include info [id number]   Time 6   Period Weeks   Status Achieved   PT LONG TERM GOAL #2   Title be independent with advanced HEP   Time 6   Period Weeks   Status On-going   PT LONG TERM GOAL #3   Title increase ROM with active cervical rotation to 70  degrees+ so patient can turn head more easily while driving   Time 6   Period Weeks   Status On-going   PT LONG TERM GOAL #4   Title perform ADL's with pain not > 3/10   Time 6   Status On-going   PT LONG TERM GOAL #5   Title decrease headaches to no more than 1-2 per week and a 50% subjective reduction in intensity   Time 6   Period Weeks   Status On-going               Plan - 09/06/14 3329    Clinical Impression Statement pt tolerated tx well, has no pain onlt stiffness in muscles. continues to have daily headaches. pt understands posture. Slight rounded shoulder posture, Met LTG #1 others ongoing   Clinical Impairments Affecting Rehab Potential Pecautions/Contraindication: Chiari Malformation   PT Treatment/Interventions Cryotherapy;Electrical Stimulation;Ultrasound;ADLs/Self Care Home Management;Moist Heat;Therapeutic activities;Patient/family education;Therapeutic exercise;Manual techniques   PT Next Visit Plan cont with POC per MPT: gentle STW to bil UT mid to distal (avoid suboccipitals) and modalities/give HEP already in system   PT Home Exercise Plan Posture   Consulted and Agree with Plan of Care Patient        Problem List Patient Active Problem List   Diagnosis Date Noted  . Depression 08/23/2014  . Anxiety 08/23/2014  . Heart murmur 08/23/2014  . Hypercholesteremia 08/23/2014  . History of Chiari malformation 08/23/2014  . 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 Fever   . Vaginal dryness 07/16/2014  . Chiari I malformation 05/10/2014  . Joint pain 10/02/2013  . Neuropathic pain 10/02/2013  . Onychomycosis of toenail 10/02/2013  . Frequent headaches 06/19/2013  . Anxiety as acute reaction to exceptional stress 05/22/2013  . Breast cancer of upper-outer quadrant of left female breast 04/06/2013  . Breast cancer 03/31/2013  . 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 Obesity (BMI 30-39.9) 12/14/2012  . GERD (gastroesophageal reflux disease) 12/14/2012  . Chronic  depression 12/14/2012  . Heart murmur, systolic 09/02/2012  . Essential hypertension 09/02/2012  . Chest pain   . Palpitations   . DEGENERATIVE JOINT DISEASE, RIGHT KNEE 10/03/2009  . DISC DEGENERATION 10/24/2007    Ariele Vidrio P, PTA 09/06/2014, 8:41 AM  [facility] [address] [place], [place], [address] Phone: [phone]   Fax:  [phone]

## 2014-09-06 NOTE — Patient Instructions (Addendum)
AROM: Neck Rotation   Turn head slowly to look over one shoulder, then the other. Hold each position __5__ seconds. Repeat __5__ times per set. Do __2__ sets per session. Do _2___ sessions per day.  http://orth.exer.us/294   Copyright  VHI. All rights reserved.  Scapular Retraction: Elbow Flexion (Standing)   With elbows bent to 90, pinch shoulder blades together and rotate arms out, keeping elbows bent. Repeat _10___ times per set. Do _2___ sets per session. Do __2__ sessions per day.  http://orth.exer.us/948   Copyright  VHI. All rights reserved.

## 2014-09-07 ENCOUNTER — Ambulatory Visit (INDEPENDENT_AMBULATORY_CARE_PROVIDER_SITE_OTHER): Payer: BLUE CROSS/BLUE SHIELD | Admitting: Family Medicine

## 2014-09-07 ENCOUNTER — Encounter: Payer: Self-pay | Admitting: Family Medicine

## 2014-09-07 VITALS — BP 120/84 | Ht 61.0 in | Wt 175.2 lb

## 2014-09-07 DIAGNOSIS — G039 Meningitis, unspecified: Secondary | ICD-10-CM | POA: Diagnosis not present

## 2014-09-07 DIAGNOSIS — F329 Major depressive disorder, single episode, unspecified: Secondary | ICD-10-CM

## 2014-09-07 DIAGNOSIS — I10 Essential (primary) hypertension: Secondary | ICD-10-CM

## 2014-09-07 DIAGNOSIS — E7800 Pure hypercholesterolemia, unspecified: Secondary | ICD-10-CM

## 2014-09-07 DIAGNOSIS — F32A Depression, unspecified: Secondary | ICD-10-CM

## 2014-09-07 MED ORDER — BUPROPION HCL ER (SR) 150 MG PO TB12: ORAL_TABLET | ORAL | Status: DC

## 2014-09-07 NOTE — Progress Notes (Signed)
   Subjective:    Patient ID: [name] [name], female    DOB: 08-Nov-1960, 54 y.o.   MRN: [id number] Patient arrives office with multiple complicated concerns HPI Patient is here today for a follow up visit on depression. Patient started taking Lexapro about 1 month ago and she states that it has not helped her at all. No suicidal or homicidal feelings noted. Patient states her symptoms have not worsened but they have not gotten any better either.    Recently admitted to the hospital with meningitis. For hospital records reviewed in presence of patient. Multiple cultures done all subsequently negative. Patient's neurosurgeon felt that this may be a post brain surgery meningeal inflammation which is generalized Daryl. Patient reports ongoing headaches are much improved compared to hospitalization.  Done with her chemotherapy for breast surgery.  Current reports ongoing fatigue tiredness    Review of Systems No abdominal pain no chest pain no bowel habits changes no blood in stool ROS otherwise negative    Objective:   Physical Exam Alert somewhat tearful no major distress lungs clear heart regular in rhythm HEENT neck supple some pain with rotation posterior surgical scar noted       Assessment & Plan:  Impression 1 depression anxiety discussed ongoing challenge patient has had several tremendous health issues of late #2 postmeningitis clinically improved #3 chronic headaches discussed #4 status post breast cancer treatment plan exercise strongly encourage. Add Wellbutrin rationale discussed. No further treatment in regards to meningitis encouraged follow-up with headache Dr. easily 25 minutes spent most in discussion lipid to be performed prior to next visit with hyperlipidemia noted last summer WSL

## 2014-09-12 ENCOUNTER — Ambulatory Visit: Payer: BLUE CROSS/BLUE SHIELD | Admitting: Physical Therapy

## 2014-09-12 ENCOUNTER — Encounter: Payer: Self-pay | Admitting: Physical Therapy

## 2014-09-12 DIAGNOSIS — M542 Cervicalgia: Secondary | ICD-10-CM

## 2014-09-12 NOTE — Therapy (Signed)
[facility] [address] [place], [place], [address] Phone: [phone]   Fax:  [phone]  Physical Therapy Treatment  Patient Details  Name: [name] [name] MRN: [id number] Date of Birth: Jan 05, 1961 Referring Provider:  Merlyn Albert, MD  Encounter Date: 09/12/2014      PT End of Session - 09/12/14 0744    Visit Number 3   Number of Visits 12   Date for PT Re-Evaluation 09/27/14   PT Start Time 0732   PT Stop Time 0804   PT Time Calculation (min) 32 min   Activity Tolerance Patient tolerated treatment well   Behavior During Therapy [facility] for tasks assessed/performed      Past Medical History  Diagnosis Date  . Chest pain 2009    Consultation-Rothbart, negative chest CT; nl echo in 2005; h/o palpitations  . Palpitations   . Degenerative joint disease     + degenerative joint disease of the lumbosacral spine  . Colitis 2010    not IBD  . Herpes simplex type II infection   . Allergic rhinitis   . Hypercholesteremia     "slightly high"  . Anxiety   . Heart murmur     "small"  . GERD (gastroesophageal reflux disease)     "a little"  . Depression   . Anemia, unspecified 05/30/2013  . Wears glasses   . Breast cancer 03/31/13    left  . Cancer   . Dysrhythmia     palpations  . Hypertension     does not have high blood pressure  . Headache   . 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 Colonoscopy  10/2010    proctitis; melanosis coli  . Tubal ligation    . Breast excisional biopsy  04/2003    Left; benign disease  . Right oophorectomy  03/13/2007  . Abdominal hysterectomy  03/13/2007    TAH ?BSO--Dr Cheron Every, [place]  . Eye surgery Left     "fix lazy eye"  . Portacath placement Right 04/21/2013    Procedure: INSERTION PORT-A-CATH;  Surgeon: Kandis Cocking, MD;  Location: [facility];  Service: General;  Laterality: Right;  . Breast lumpectomy with needle localization and axillary  lymph node dissection Left 09/12/2013    Procedure: BREAST LUMPECTOMY WITH NEEDLE LOCALIZATION AND AXILLARY LYMPH NODE DISSECTION;  Surgeon: Kandis Cocking, MD;  Location: [facility];  Service: General;  Laterality: Left;  and axilla  . Breast surgery    . Suboccipital craniectomy cervical laminectomy N/A 05/10/2014    Procedure: SUBOCCIPITAL CRANIECTOMY CERVICAL LAMINECTOMY/DURAPLASTY;  Surgeon: Hewitt Shorts, MD;  Location: [facility];  Service: Neurosurgery;  Laterality: N/A;  suboccipital craniectomy with cervical laminectomy and duraplasty    LMP 12/04/2008  Visit Diagnosis:  Neck pain      Subjective Assessment - 09/12/14 0739    Symptoms some soreness after last tx   Currently in Pain? Yes   Pain Score 3    Pain Location Neck   Pain Orientation Right;Left   Pain Descriptors / Indicators Sore   Pain Type Acute pain   Pain Frequency Intermittent                    [facility] Adult PT Treatment/Exercise - 09/12/14 0001    Modalities   Modalities Electrical Stimulation;Ultrasound   Moist Heat Therapy   Number Minutes Moist Heat 15 Minutes   Moist Heat Location --  cervical  Programme researcher, broadcasting/film/video Location Bil c-spine UT area   Engineer, manufacturing premod   Electrical Stimulation Parameters 1-10hz    Electrical Stimulation Goals Pain   Ultrasound   Ultrasound Location Bil UT   Ultrasound Parameters 1.5/cm2/50%/24mhz   Ultrasound Goals Pain                     PT Long Term Goals - 09/06/14 0834    PT LONG TERM GOAL #1   Title demonstrate and/or verbalize techniques to reduce the risk of re-injury to include info [id number]   Time 6   Period Weeks   Status Achieved   PT LONG TERM GOAL #2   Title be independent with advanced HEP   Time 6   Period Weeks   Status On-going   PT LONG TERM GOAL #3   Title increase ROM with active cervical rotation to 70 degrees+ so patient can turn head more  easily while driving   Time 6   Period Weeks   Status On-going   PT LONG TERM GOAL #4   Title perform ADL's with pain not > 3/10   Time 6   Status On-going   PT LONG TERM GOAL #5   Title decrease headaches to no more than 1-2 per week and a 50% subjective reduction in intensity   Time 6   Period Weeks   Status On-going               Plan - 09/12/14 0745    Clinical Impression Statement pt tolerated tx well, continues to report headaches and muscle soreness in UT area, goals ongoing. rounded shoulder posture.   Clinical Impairments Affecting Rehab Potential Pecautions/Contraindication: Chiari Malformation   PT Treatment/Interventions Cryotherapy;Electrical Stimulation;Ultrasound;ADLs/Self Care Home Management;Moist Heat;Therapeutic activities;Patient/family education;Therapeutic exercise;Manual techniques   PT Next Visit Plan cont with POC per MPT: US/ gentle STW to bil UT mid to distal (avoid suboccipitals) and modalities   Consulted and Agree with Plan of Care Patient        Problem List Patient Active Problem List   Diagnosis Date Noted  . Depression 08/23/2014  . Anxiety 08/23/2014  . Heart murmur 08/23/2014  . Hypercholesteremia 08/23/2014  . History of Chiari malformation 08/23/2014  . 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 Fever   . Vaginal dryness 07/16/2014  . Chiari I malformation 05/10/2014  . Joint pain 10/02/2013  . Neuropathic pain 10/02/2013  . Onychomycosis of toenail 10/02/2013  . Frequent headaches 06/19/2013  . Anxiety as acute reaction to exceptional stress 05/22/2013  . Breast cancer of upper-outer quadrant of left female breast 04/06/2013  . Breast cancer 03/31/2013  . 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 Obesity (BMI 30-39.9) 12/14/2012  . GERD (gastroesophageal reflux disease) 12/14/2012  . Chronic depression 12/14/2012  . Heart murmur, systolic 09/02/2012  . Essential hypertension 09/02/2012  . Chest pain   . Palpitations   . DEGENERATIVE  JOINT DISEASE, RIGHT KNEE 10/03/2009  . DISC DEGENERATION 10/24/2007    Missy Baksh P, PTA 09/12/2014, 8:06 AM  [facility] [address] [place], [place], [address] Phone: [phone]   Fax:  [phone]

## 2014-09-13 ENCOUNTER — Encounter: Payer: Self-pay | Admitting: Family Medicine

## 2014-09-13 ENCOUNTER — Ambulatory Visit (INDEPENDENT_AMBULATORY_CARE_PROVIDER_SITE_OTHER): Payer: BLUE CROSS/BLUE SHIELD | Admitting: Family Medicine

## 2014-09-13 VITALS — BP 132/88 | Temp 102.7°F | Wt 170.0 lb

## 2014-09-13 DIAGNOSIS — J1189 Influenza due to unidentified influenza virus with other manifestations: Secondary | ICD-10-CM

## 2014-09-13 DIAGNOSIS — J111 Influenza due to unidentified influenza virus with other respiratory manifestations: Secondary | ICD-10-CM

## 2014-09-13 MED ORDER — OSELTAMIVIR PHOSPHATE 75 MG PO CAPS: 75.0000 mg | ORAL_CAPSULE | Freq: Two times a day (BID) | ORAL | Status: DC

## 2014-09-13 MED ORDER — HYDROCODONE-HOMATROPINE 5-1.5 MG/5ML PO SYRP: ORAL_SOLUTION | ORAL | Status: DC

## 2014-09-13 NOTE — Progress Notes (Signed)
   Subjective:    Patient ID: [name] [name], female    DOB: 1960-11-11, 54 y.o.   MRN: [id number]  Cough This is a new problem. Episode onset: 3 days ago. Associated symptoms include a fever, headaches, myalgias and nasal congestion. Treatments tried: tylenol.   mon starrted   raelly kicked in on tue  cough and on   No energy level  No appetite  No fly shot yr  Review of Systems  Constitutional: Positive for fever.  Respiratory: Positive for cough.   Musculoskeletal: Positive for myalgias.  Neurological: Positive for headaches.       Objective:   Physical Exam  Alert moderate malaise. Positive fever. Vitals stable HEENT neck supple. Lungs clear. Heart regular rate and rhythm.      Assessment & Plan:  Impression 1 influenza plan Tamiflu twice a day 5 days. Symptomatic care discussed. Warning signs discussed. Hydration diet discussed WSL

## 2014-09-17 ENCOUNTER — Encounter: Payer: BLUE CROSS/BLUE SHIELD | Admitting: Physical Therapy

## 2014-10-01 ENCOUNTER — Other Ambulatory Visit: Payer: Self-pay | Admitting: *Deleted

## 2014-10-01 MED ORDER — ESCITALOPRAM OXALATE 20 MG PO TABS: 20.0000 mg | ORAL_TABLET | Freq: Every day | ORAL | Status: DC

## 2014-10-02 ENCOUNTER — Ambulatory Visit: Payer: BLUE CROSS/BLUE SHIELD | Admitting: Physical Therapy

## 2014-10-02 ENCOUNTER — Encounter: Payer: Self-pay | Admitting: Physical Therapy

## 2014-10-02 DIAGNOSIS — M542 Cervicalgia: Secondary | ICD-10-CM | POA: Diagnosis not present

## 2014-10-02 NOTE — Therapy (Signed)
[facility] [place], [place], [address] Phone: [phone]   Fax:  [phone]  Physical Therapy Treatment  Patient Details  Name: [name] [name] MRN: [id number] Date of Birth: 07-Feb-1961 Referring Provider:  Mikey Kirschner, MD  Encounter Date: 10/02/2014      PT End of Session - 10/02/14 0919    Visit Number 4   Number of Visits 12   Date for PT Re-Evaluation 09/27/14   PT Start Time 0175   PT Stop Time 0918   PT Time Calculation (min) 19 min   Activity Tolerance Patient tolerated treatment well      Past Medical History  Diagnosis Date  . Chest pain 2009    Consultation-Rothbart, negative chest CT; nl echo in 2005; h/o palpitations  . Palpitations   . Degenerative joint disease     + degenerative joint disease of the lumbosacral spine  . Colitis 2010    not IBD  . Herpes simplex type II infection   . Allergic rhinitis   . Hypercholesteremia     "slightly high"  . Anxiety   . Heart murmur     "small"  . GERD (gastroesophageal reflux disease)     "a little"  . Depression   . Anemia, unspecified 05/30/2013  . Wears glasses   . Breast cancer 03/31/13    left  . Cancer   . Dysrhythmia     palpations  . Hypertension     does not have high blood pressure  . Headache   . 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 Colonoscopy  10/2010    proctitis; melanosis coli  . Tubal ligation    . Breast excisional biopsy  04/2003    Left; benign disease  . Right oophorectomy  03/13/2007  . Abdominal hysterectomy  03/13/2007    TAH ?BSO--Dr Levin Bacon, [place]  . Eye surgery Left     "fix lazy eye"  . Portacath placement Right 04/21/2013    Procedure: INSERTION PORT-A-CATH;  Surgeon: [name], MD;  Location: [facility];  Service: General;  Laterality: Right;  . Breast lumpectomy with needle localization and axillary lymph node dissection Left 09/12/2013    Procedure: BREAST  LUMPECTOMY WITH NEEDLE LOCALIZATION AND AXILLARY LYMPH NODE DISSECTION;  Surgeon: [name], MD;  Location: [facility];  Service: General;  Laterality: Left;  and axilla  . Breast surgery    . Suboccipital craniectomy cervical laminectomy N/A 05/10/2014    Procedure: SUBOCCIPITAL CRANIECTOMY CERVICAL LAMINECTOMY/DURAPLASTY;  Surgeon: [name], MD;  Location: [facility];  Service: Neurosurgery;  Laterality: N/A;  suboccipital craniectomy with cervical laminectomy and duraplasty    There were no vitals filed for this visit.  Visit Diagnosis:  Neck pain      Subjective Assessment - 10/02/14 0904    Symptoms pt reported no improvement of symptoms aftrer last tx. pt has been sick with flu.    Currently in Pain? Yes   Pain Score 4    Pain Location Neck   Pain Orientation Left;Right   Pain Descriptors / Indicators Sore                       [facility] Adult PT Treatment/Exercise - 10/02/14 0001    Moist Heat Therapy   Number Minutes Moist Heat 15 Minutes   Moist Heat Location --  cervical   Electrical Stimulation   Electrical Stimulation  Location Bil c-spine UT area   Electrical Stimulation Action premod   Electrical Stimulation Parameters 1-10hz    Electrical Stimulation Goals Pain                     PT Long Term Goals - 09/06/14 0834    PT LONG TERM GOAL #1   Title demonstrate and/or verbalize techniques to reduce the risk of re-injury to include info [id number]   Time 6   Period Weeks   Status Achieved   PT LONG TERM GOAL #2   Title be independent with advanced HEP   Time 6   Period Weeks   Status On-going   PT LONG TERM GOAL #3   Title increase ROM with active cervical rotation to 70 degrees+ so patient can turn head more easily while driving   Time 6   Period Weeks   Status On-going   PT LONG TERM GOAL #4   Title perform ADL's with pain not > 3/10   Time 6   Status On-going   PT LONG TERM GOAL #5   Title  decrease headaches to no more than 1-2 per week and a 50% subjective reduction in intensity   Time 6   Period Weeks   Status On-going               Plan - 10/02/14 0919    Clinical Impression Statement pt tolerated ES/heat very well and feels like that helps. pt refused gentle STW and [place] today. pt has not reported long term improvement thus far. goals ongoing. forward posture toady.   Clinical Impairments Affecting Rehab Potential Pecautions/Contraindication: Chiari Malformation   PT Treatment/Interventions Cryotherapy;Electrical Stimulation;Ultrasound;ADLs/Self Care Home Management;Moist Heat;Therapeutic activities;Patient/family education;Therapeutic exercise;Manual techniques   PT Next Visit Plan cont with POC per MPT: US/ gentle STW to bil UT mid to distal (avoid suboccipitals) and modalities   Consulted and Agree with Plan of Care Patient        Problem List Patient Active Problem List   Diagnosis Date Noted  . Depression 08/23/2014  . Anxiety 08/23/2014  . Heart murmur 08/23/2014  . Hypercholesteremia 08/23/2014  . History of Chiari malformation 08/23/2014  . 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 Fever   . Vaginal dryness 07/16/2014  . Chiari I malformation 05/10/2014  . Joint pain 10/02/2013  . Neuropathic pain 10/02/2013  . Onychomycosis of toenail 10/02/2013  . Frequent headaches 06/19/2013  . Anxiety as acute reaction to exceptional stress 05/22/2013  . Breast cancer of upper-outer quadrant of left female breast 04/06/2013  . Breast cancer 03/31/2013  . 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 Obesity (BMI 30-39.9) 12/14/2012  . GERD (gastroesophageal reflux disease) 12/14/2012  . Chronic depression 12/14/2012  . Heart murmur, systolic 68/09/2120  . Essential hypertension 09/02/2012  . Chest pain   . Palpitations   . DEGENERATIVE JOINT DISEASE, RIGHT KNEE 10/03/2009  . [place] DEGENERATION 10/24/2007    Phillips Climes, PTA 10/02/2014, 9:24 AM  [facility] [address] [place], [place], [address] Phone: [phone]   Fax:  [phone]

## 2014-10-03 ENCOUNTER — Other Ambulatory Visit: Payer: Self-pay | Admitting: Family Medicine

## 2014-10-05 ENCOUNTER — Encounter: Payer: Self-pay | Admitting: *Deleted

## 2014-10-05 ENCOUNTER — Ambulatory Visit: Payer: BLUE CROSS/BLUE SHIELD | Attending: Neurology | Admitting: *Deleted

## 2014-10-05 DIAGNOSIS — M542 Cervicalgia: Secondary | ICD-10-CM

## 2014-10-05 LAB — AFB CULTURE WITH SMEAR (NOT AT ARMC): Acid Fast Smear: NONE SEEN

## 2014-10-05 NOTE — Therapy (Signed)
[facility] [address] [place], [place], [address] Phone: [phone]   Fax:  [phone]  Physical Therapy Treatment  Patient Details  Name: [name] [name] MRN: [id number] Date of Birth: 01/21/1961 Referring Provider:  Luking, William S, MD  Encounter Date: 10/05/2014      PT End of Session - 10/05/14 0849    Visit Number 5   Number of Visits 12   Date for PT Re-Evaluation 09/27/14   PT Start Time 0816   PT Stop Time 0901   PT Time Calculation (min) 45 min      Past Medical History  Diagnosis Date  . Chest pain 2009    Consultation-Rothbart, negative chest CT; nl echo in 2005; h/o palpitations  . Palpitations   . Degenerative joint disease     + degenerative joint disease of the lumbosacral spine  . Colitis 2010    not IBD  . Herpes simplex type II infection   . Allergic rhinitis   . Hypercholesteremia     "slightly high"  . Anxiety   . Heart murmur     "small"  . GERD (gastroesophageal reflux disease)     "a little"  . Depression   . Anemia, unspecified 05/30/2013  . Wears glasses   . Breast cancer 03/31/13    left  . Cancer   . Dysrhythmia     palpations  . Hypertension     does not have high blood pressure  . Headache   . 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 Colonoscopy  10/2010    proctitis; melanosis coli  . Tubal ligation    . Breast excisional biopsy  04/2003    Left; benign disease  . Right oophorectomy  03/13/2007  . Abdominal hysterectomy  03/13/2007    TAH ?BSO--Dr Eure, [place], [place]  . Eye surgery Left     "fix lazy eye"  . Portacath placement Right 04/21/2013    Procedure: INSERTION PORT-A-CATH;  Surgeon: David H Newman, MD;  [facility]: [facility];  Service: General;  Laterality: Right;  . Breast lumpectomy with needle localization and axillary lymph node dissection Left 09/12/2013    Procedure: BREAST LUMPECTOMY WITH NEEDLE LOCALIZATION AND AXILLARY LYMPH  NODE DISSECTION;  Surgeon: David H Newman, MD;  Location: [facility];  Service: General;  Laterality: Left;  and axilla  . Breast surgery    . Suboccipital craniectomy cervical laminectomy N/A 05/10/2014    Procedure: SUBOCCIPITAL CRANIECTOMY CERVICAL LAMINECTOMY/DURAPLASTY;  Surgeon: Robert W Nudelman, MD;  Location: [facility];  Service: Neurosurgery;  Laterality: N/A;  suboccipital craniectomy with cervical laminectomy and duraplasty    There were no vitals filed for this visit.  Visit Diagnosis:  Neck pain      Subjective Assessment - 10/05/14 0824    Symptoms pt reported no improvement of symptoms aftrer last. Ready to DC for my neck and start Rx for Both hips   Currently in Pain? No/denies   Pain Location Neck   Pain Orientation Right;Left   Pain Descriptors / Indicators Sore;Tightness   Pain Type Acute pain   Pain Frequency Intermittent            [facility] PT Assessment - 10/05/14 0001    ROM / Strength   AROM / PROM / Strength AROM                   [facility] Adult PT Treatment/Exercise - 10/05/14 0001      Moist Heat Therapy   Number Minutes Moist Heat 20 Minutes   Moist Heat Location --  neck   Electrical Stimulation   Electrical Stimulation Location Bil c-spine UT area   Electrical Stimulation Action premod   Electrical Stimulation Parameters , x 20 min   Electrical Stimulation Goals --  soreness/ tightness   Manual Therapy   Manual Therapy Myofascial release   Myofascial Release IASTM to bil Utraps and around incision with pt sitting                     PT Long Term Goals - 10/05/14 0850    PT LONG TERM GOAL #1   Title demonstrate and/or verbalize techniques to reduce the risk of re-injury to include info on:posture   Time 6   Period Weeks   Status Achieved   PT LONG TERM GOAL #2   Title be independent with advanced HEP   Time 6   Period Weeks   Status Achieved   PT LONG TERM GOAL #3   Title increase ROM with  active cervical rotation to 70 degrees+ so patient can turn head more easily while driving   Time 6   Period Weeks   Status Not Met  BIL. cerv. ROT was 50 degrees each way   PT LONG TERM GOAL #4   Title perform ADL's with pain not > 3/10   Time 6   Status Not Met   PT LONG TERM GOAL #5   Title decrease headaches to no more than 1-2 per week and a 50% subjective reduction in intensity   Time 6   Period Weeks   Status Not Met               Plan - 10/05/14 0849    Clinical Impression Statement Pt is ready to DC PT at this time due to lack of progress. She is currently Independent with cerv. ex.s and will start PT for bil. hip bursitis   Clinical Impairments Affecting Rehab Potential Pecautions/Contraindication: Chiari Malformation   PT Treatment/Interventions Cryotherapy;Electrical Stimulation;Ultrasound;ADLs/Self Care Home Management;Moist Heat;Therapeutic activities;Patient/family education;Therapeutic exercise;Manual techniques   PT Next Visit Plan DC to HEP as per pt. request due to lack of progress   PT Home Exercise Plan Posture   Consulted and Agree with Plan of Care Patient        Problem List Patient Active Problem List   Diagnosis Date Noted  . Depression 08/23/2014  . Anxiety 08/23/2014  . Heart murmur 08/23/2014  . Hypercholesteremia 08/23/2014  . History of Chiari malformation 08/23/2014  . 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 Fever   . Vaginal dryness 07/16/2014  . Chiari I malformation 05/10/2014  . Joint pain 10/02/2013  . Neuropathic pain 10/02/2013  . Onychomycosis of toenail 10/02/2013  . Frequent headaches 06/19/2013  . Anxiety as acute reaction to exceptional stress 05/22/2013  . Breast cancer of upper-outer quadrant of left female breast 04/06/2013  . Breast cancer 03/31/2013  . 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 Obesity (BMI 30-39.9) 12/14/2012  . GERD (gastroesophageal reflux disease) 12/14/2012  . Chronic depression 12/14/2012  . Heart  murmur, systolic 09/02/2012  . Essential hypertension 09/02/2012  . Chest pain   . Palpitations   . DEGENERATIVE JOINT DISEASE, RIGHT KNEE 10/03/2009  . DISC DEGENERATION 10/24/2007    PHYSICAL THERAPY DISCHARGE SUMMARY  Visits from Start of Care: 5  Current functional level related to goals / functional outcomes: Please see above.   Remaining deficits: See goals above.     Education / Equipment: HEP. Plan: Patient agrees to discharge.  Patient goals were partially met. Patient is being discharged due to the patient's request.  ?????      APPLEGATE, CHAD MPT 10/05/2014, 12:17 PM   [facility] [address] [place], , [address] Phone: [phone]   Fax:  [phone]      

## 2014-10-05 NOTE — Therapy (Signed)
[facility] [address] [place], [place], [address] Phone: [phone]   Fax:  [phone]  Physical Therapy Treatment  Patient Details  Name: [name] [name] MRN: [id number] Date of Birth: 07/22/1960 Referring Provider:  Luking, William S, MD  Encounter Date: 10/05/2014      PT End of Session - 10/05/14 0849    Visit Number 5   Number of Visits 12   Date for PT Re-Evaluation 09/27/14   PT Start Time 0816   PT Stop Time 0901   PT Time Calculation (min) 45 min      Past Medical History  Diagnosis Date  . Chest pain 2009    Consultation-Rothbart, negative chest CT; nl echo in 2005; h/o palpitations  . Palpitations   . Degenerative joint disease     + degenerative joint disease of the lumbosacral spine  . Colitis 2010    not IBD  . Herpes simplex type II infection   . Allergic rhinitis   . Hypercholesteremia     "slightly high"  . Anxiety   . Heart murmur     "small"  . GERD (gastroesophageal reflux disease)     "a little"  . Depression   . Anemia, unspecified 05/30/2013  . Wears glasses   . Breast cancer 03/31/13    left  . Cancer   . Dysrhythmia     palpations  . Hypertension     does not have high blood pressure  . Headache   . 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 Colonoscopy  10/2010    proctitis; melanosis coli  . Tubal ligation    . Breast excisional biopsy  04/2003    Left; benign disease  . Right oophorectomy  03/13/2007  . Abdominal hysterectomy  03/13/2007    TAH ?BSO--Dr Eure, , [place]  . Eye surgery Left     "fix lazy eye"  . Portacath placement Right 04/21/2013    Procedure: INSERTION PORT-A-CATH;  Surgeon: David H Newman, MD;  [facility]: [facility];  Service: General;  Laterality: Right;  . Breast lumpectomy with needle localization and axillary lymph node dissection Left 09/12/2013    Procedure: BREAST LUMPECTOMY WITH NEEDLE LOCALIZATION AND AXILLARY LYMPH  NODE DISSECTION;  Surgeon: David H Newman, MD;  Location: [facility];  Service: General;  Laterality: Left;  and axilla  . Breast surgery    . Suboccipital craniectomy cervical laminectomy N/A 05/10/2014    Procedure: SUBOCCIPITAL CRANIECTOMY CERVICAL LAMINECTOMY/DURAPLASTY;  Surgeon: Robert W Nudelman, MD;  [facility]: [facility];  Service: Neurosurgery;  Laterality: N/A;  suboccipital craniectomy with cervical laminectomy and duraplasty    There were no vitals filed for this visit.  Visit Diagnosis:  Neck pain      Subjective Assessment - 10/05/14 0824    Symptoms pt reported no improvement of symptoms aftrer last. Ready to DC for my neck and start Rx for Both hips   Currently in Pain? No/denies   Pain Location Neck   Pain Orientation Right;Left   Pain Descriptors / Indicators Sore;Tightness   Pain Type Acute pain   Pain Frequency Intermittent            [facility] PT Assessment - 10/05/14 0001    ROM / Strength   AROM / PROM / Strength AROM                   [facility] Adult PT Treatment/Exercise - 10/05/14 0001      Moist Heat Therapy   Number Minutes Moist Heat 20 Minutes   Moist Heat Location --  neck   Electrical Stimulation   Electrical Stimulation Location Bil c-spine UT area   Electrical Stimulation Action premod   Electrical Stimulation Parameters , x 20 min   Electrical Stimulation Goals --  soreness/ tightness   Manual Therapy   Manual Therapy Myofascial release   Myofascial Release IASTM to bil Utraps and around incision with pt sitting                     PT Long Term Goals - 10/05/14 0850    PT LONG TERM GOAL #1   Title demonstrate and/or verbalize techniques to reduce the risk of re-injury to include info [id number]   Time 6   Period Weeks   Status Achieved   PT LONG TERM GOAL #2   Title be independent with advanced HEP   Time 6   Period Weeks   Status Achieved   PT LONG TERM GOAL #3   Title increase ROM with  active cervical rotation to 70 degrees+ so patient can turn head more easily while driving   Time 6   Period Weeks   Status Not Met  BIL. cerv. ROT was 50 degrees each way   PT LONG TERM GOAL #4   Title perform ADL's with pain not > 3/10   Time 6   Status Not Met   PT LONG TERM GOAL #5   Title decrease headaches to no more than 1-2 per week and a 50% subjective reduction in intensity   Time 6   Period Weeks   Status Not Met               Plan - 10/05/14 0849    Clinical Impression Statement Pt is ready to DC PT at this time due to lack of progress. She is currently Independent with cerv. ex.s and will start PT for bil. hip bursitis   Clinical Impairments Affecting Rehab Potential Pecautions/Contraindication: Chiari Malformation   PT Treatment/Interventions Cryotherapy;Electrical Stimulation;Ultrasound;ADLs/Self Care Home Management;Moist Heat;Therapeutic activities;Patient/family education;Therapeutic exercise;Manual techniques   PT Next Visit Plan DC to HEP as per pt. request due to lack of progress   PT Home Exercise Plan Posture   Consulted and Agree with Plan of Care Patient        Problem List Patient Active Problem List   Diagnosis Date Noted  . Depression 08/23/2014  . Anxiety 08/23/2014  . Heart murmur 08/23/2014  . Hypercholesteremia 08/23/2014  . History of Chiari malformation 08/23/2014  . 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 Fever   . Vaginal dryness 07/16/2014  . Chiari I malformation 05/10/2014  . Joint pain 10/02/2013  . Neuropathic pain 10/02/2013  . Onychomycosis of toenail 10/02/2013  . Frequent headaches 06/19/2013  . Anxiety as acute reaction to exceptional stress 05/22/2013  . Breast cancer of upper-outer quadrant of left female breast 04/06/2013  . Breast cancer 03/31/2013  . 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 Obesity (BMI 30-39.9) 12/14/2012  . GERD (gastroesophageal reflux disease) 12/14/2012  . Chronic depression 12/14/2012  . Heart  murmur, systolic 93/23/5573  . Essential hypertension 09/02/2012  . Chest pain   . Palpitations   . DEGENERATIVE JOINT DISEASE, RIGHT KNEE 10/03/2009  . [place] DEGENERATION 10/24/2007    RAMSEUR,CHRIS, PTA 10/05/2014, 9:07 AM  [facility] [address] [place], [place], [address] Phone: [phone]   Fax:  [phone]

## 2014-10-11 DIAGNOSIS — Z029 Encounter for administrative examinations, unspecified: Secondary | ICD-10-CM

## 2014-10-13 LAB — LIPID PANEL
Chol/HDL Ratio: 3.2 ratio units (ref 0.0–4.4)
Cholesterol, Total: 295 mg/dL — ABNORMAL HIGH (ref 100–199)
HDL: 92 mg/dL (ref >39)
LDL Calculated: 183 mg/dL — ABNORMAL HIGH (ref 0–99)
Triglycerides: 98 mg/dL (ref 0–149)
VLDL Cholesterol Cal: 20 mg/dL (ref 5–40)

## 2014-10-15 ENCOUNTER — Ambulatory Visit: Payer: BLUE CROSS/BLUE SHIELD | Attending: Sports Medicine | Admitting: Physical Therapy

## 2014-10-15 ENCOUNTER — Other Ambulatory Visit: Payer: Self-pay | Admitting: *Deleted

## 2014-10-15 DIAGNOSIS — M25552 Pain in left hip: Secondary | ICD-10-CM | POA: Insufficient documentation

## 2014-10-15 DIAGNOSIS — M25551 Pain in right hip: Secondary | ICD-10-CM | POA: Diagnosis not present

## 2014-10-15 DIAGNOSIS — C50412 Malignant neoplasm of upper-outer quadrant of left female breast: Secondary | ICD-10-CM

## 2014-10-15 NOTE — Therapy (Signed)
[facility] [address] [place], [place], [address] Phone: [phone]   Fax:  [phone]  Physical Therapy Treatment  Patient Details  Name: [name] [name] MRN: [id number] Date of Birth: 08-31-1960 Referring Provider:  Merlyn Albert, MD  Encounter Date: 10/05/2014    Past Medical History  Diagnosis Date  . Chest pain 2009    Consultation-Rothbart, negative chest CT; nl echo in 2005; h/o palpitations  . Palpitations   . Degenerative joint disease     + degenerative joint disease of the lumbosacral spine  . Colitis 2010    not IBD  . Herpes simplex type II infection   . Allergic rhinitis   . Hypercholesteremia     "slightly high"  . Anxiety   . Heart murmur     "small"  . GERD (gastroesophageal reflux disease)     "a little"  . Depression   . Anemia, unspecified 05/30/2013  . Wears glasses   . Breast cancer 03/31/13    left  . Cancer   . Dysrhythmia     palpations  . Hypertension     does not have high blood pressure  . Headache   . 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 Colonoscopy  10/2010    proctitis; melanosis coli  . Tubal ligation    . Breast excisional biopsy  04/2003    Left; benign disease  . Right oophorectomy  03/13/2007  . Abdominal hysterectomy  03/13/2007    TAH ?BSO--Dr Cheron Every, [place]  . Eye surgery Left     "fix lazy eye"  . Portacath placement Right 04/21/2013    Procedure: INSERTION PORT-A-CATH;  Surgeon: Kandis Cocking, MD;  Location: [facility];  Service: General;  Laterality: Right;  . Breast lumpectomy with needle localization and axillary lymph node dissection Left 09/12/2013    Procedure: BREAST LUMPECTOMY WITH NEEDLE LOCALIZATION AND AXILLARY LYMPH NODE DISSECTION;  Surgeon: Kandis Cocking, MD;  Location: [facility];  Service: General;  Laterality: Left;  and axilla  . Breast surgery    . Suboccipital craniectomy cervical laminectomy  N/A 05/10/2014    Procedure: SUBOCCIPITAL CRANIECTOMY CERVICAL LAMINECTOMY/DURAPLASTY;  Surgeon: Hewitt Shorts, MD;  Location: [facility];  Service: Neurosurgery;  Laterality: N/A;  suboccipital craniectomy with cervical laminectomy and duraplasty    There were no vitals filed for this visit.  Visit Diagnosis:  Neck pain                                  PT Long Term Goals - 10/05/14 0850    PT LONG TERM GOAL #1   Title demonstrate and/or verbalize techniques to reduce the risk of re-injury to include info [id number]   Time 6   Period Weeks   Status Achieved   PT LONG TERM GOAL #2   Title be independent with advanced HEP   Time 6   Period Weeks   Status Achieved   PT LONG TERM GOAL #3   Title increase ROM with active cervical rotation to 70 degrees+ so patient can turn head more easily while driving   Time 6   Period Weeks   Status Not Met  BIL. cerv. ROT was 50 degrees each way   PT LONG TERM GOAL #4   Title perform ADL's with pain not > 3/10   Time 6   Status Not  Met   PT LONG TERM GOAL #5   Title decrease headaches to no more than 1-2 per week and a 50% subjective reduction in intensity   Time 6   Period Weeks   Status Not Met               Problem List Patient Active Problem List   Diagnosis Date Noted  . Depression 08/23/2014  . Anxiety 08/23/2014  . Heart murmur 08/23/2014  . Hypercholesteremia 08/23/2014  . History of Chiari malformation 08/23/2014  . 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 Fever   . Vaginal dryness 07/16/2014  . Chiari I malformation 05/10/2014  . Joint pain 10/02/2013  . Neuropathic pain 10/02/2013  . Onychomycosis of toenail 10/02/2013  . Frequent headaches 06/19/2013  . Anxiety as acute reaction to exceptional stress 05/22/2013  . Breast cancer of upper-outer quadrant of left female breast 04/06/2013  . Breast cancer 03/31/2013  . 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 Obesity (BMI 30-39.9)  12/14/2012  . GERD (gastroesophageal reflux disease) 12/14/2012  . Chronic depression 12/14/2012  . Heart murmur, systolic 09/02/2012  . Essential hypertension 09/02/2012  . Chest pain   . Palpitations   . DEGENERATIVE JOINT DISEASE, RIGHT KNEE 10/03/2009  . DISC DEGENERATION 10/24/2007   PHYSICAL THERAPY DISCHARGE SUMMARY  Visits from Start of Care:   Current functional level related to goals / functional outcomes:    Remaining deficits:  Please see goal assessment above.    Education / Equipment:  Plan: Patient agrees to discharge.  Patient goals were partially met. Patient is being discharged due to the patient's request.  ?????       Gaynelle Pastrana, [place] MPT 10/15/2014, 10:37 AM  [facility] [address] [place], [place], [address] Phone: [phone]   Fax:  [phone]

## 2014-10-15 NOTE — Therapy (Signed)
[facility] [address] [place], [place], [address] Phone: [phone]   Fax:  [phone]  Physical Therapy Evaluation  Patient Details  Name: [name] [name] MRN: [id number] Date of Birth: [birth year] Referring Provider:  Delton See, MD  Encounter Date: 10/15/2014      PT End of Session - 10/15/14 1059    Visit Number 1   Number of Visits 12   Date for PT Re-Evaluation 12/10/14   PT Start Time 1039   PT Stop Time 1122   PT Time Calculation (min) 43 min   Activity Tolerance Patient tolerated treatment well   Behavior During Therapy [facility] for tasks assessed/performed      Past Medical History  Diagnosis Date  . Chest pain 2009    Consultation-Rothbart, negative chest CT; nl echo in 2005; h/o palpitations  . Palpitations   . Degenerative joint disease     + degenerative joint disease of the lumbosacral spine  . Colitis 2010    not IBD  . Herpes simplex type II infection   . Allergic rhinitis   . Hypercholesteremia     "slightly high"  . Anxiety   . Heart murmur     "small"  . GERD (gastroesophageal reflux disease)     "a little"  . Depression   . Anemia, unspecified 05/30/2013  . Wears glasses   . Breast cancer 03/31/13    left  . Cancer   . Dysrhythmia     palpations  . Hypertension     does not have high blood pressure  . Headache   . 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 Colonoscopy  10/2010    proctitis; melanosis coli  . Tubal ligation    . Breast excisional biopsy  04/2003    Left; benign disease  . Right oophorectomy  03/13/2007  . Abdominal hysterectomy  03/13/2007    TAH ?BSO--Dr Cheron Every, [place]  . Eye surgery Left     "fix lazy eye"  . Portacath placement Right 04/21/2013    Procedure: INSERTION PORT-A-CATH;  Surgeon: Kandis Cocking, MD;  Location: [facility];  Service: General;  Laterality: Right;  . Breast lumpectomy with needle localization and axillary  lymph node dissection Left 09/12/2013    Procedure: BREAST LUMPECTOMY WITH NEEDLE LOCALIZATION AND AXILLARY LYMPH NODE DISSECTION;  Surgeon: Kandis Cocking, MD;  Location: [facility];  Service: General;  Laterality: Left;  and axilla  . Breast surgery    . Suboccipital craniectomy cervical laminectomy N/A 05/10/2014    Procedure: SUBOCCIPITAL CRANIECTOMY CERVICAL LAMINECTOMY/DURAPLASTY;  Surgeon: Hewitt Shorts, MD;  Location: [facility];  Service: Neurosurgery;  Laterality: N/A;  suboccipital craniectomy with cervical laminectomy and duraplasty    There were no vitals filed for this visit.  Visit Diagnosis:  Bilateral hip pain - Plan: PT plan of care cert/re-cert      Subjective Assessment - 10/15/14 1045    Subjective Years of biltateral hip hip left > right.   Limitations Walking   How long can you walk comfortably? one mile.   Pain Score 5    Pain Location Hip   Pain Orientation Right;Left   Pain Descriptors / Indicators Aching   Pain Type Chronic pain   Pain Frequency Constant            [facility] PT Assessment - 10/15/14 0001    Assessment   Medical Diagnosis Bilateral hip pain.   Onset Date --  Many years.   Precautions   Precautions None   Balance Screen   Has the patient fallen in the past 6 months No   Has the patient had a decrease in activity level because of a fear of falling?  No   Is the patient reluctant to leave their home because of a fear of falling?  No   ROM / Strength   AROM / PROM / Strength AROM   AROM   Overall AROM Comments Normal bilateral LE ROM.   Strength   Overall Strength Comments Bilateral hip abduction strength= 4 to 4+/5.   Palpation   Palpation Very tender to palpation around left greater trochanter especially posteriorly and to a lesser degree on right.   Bed Mobility   Bed Mobility --  Normal bilateral LE DTR's.                   [facility] Adult PT Treatment/Exercise - 10/15/14 0001    Electrical  Stimulation   Electrical Stimulation Location Bilateral hips.   Electrical Stimulation Action Pre-mod 80-150 Hx x 20 minutes.                     PT Long Term Goals - 10/15/14 1104    PT LONG TERM GOAL #1   Title Ind with HEP.   Time 4   Period Weeks   Status New   PT LONG TERM GOAL #2   Title Sleep 6 hours undisturbed.   Time 4   Period Weeks   PT LONG TERM GOAL #3   Title Perform ADL's with pain not > 2/10               Plan - 10/15/14 1059    Clinical Impression Statement The patient has a multiple year h/o bilateral hip pain left > right.  Typial daily pain-level is a 4/10 especially after walking  a mile or greater.   Pt will benefit from skilled therapeutic intervention in order to improve on the following deficits Pain;Decreased activity tolerance   Rehab Potential Excellent   PT Frequency 3x / week   PT Duration 4 weeks   PT Treatment/Interventions ADLs/Self Care Home Management;Moist Heat;Therapeutic activities;Patient/family education;Therapeutic exercise;Ultrasound;Manual techniques;Cryotherapy;Electrical Stimulation   PT Next Visit Plan E'stim; Combo o bilateral hips and STW/M to left hip.  "Clamshell and sdly hip abduction bilaterally.   Consulted and Agree with Plan of Care Patient         Problem List Patient Active Problem List   Diagnosis Date Noted  . Depression 08/23/2014  . Anxiety 08/23/2014  . Heart murmur 08/23/2014  . Hypercholesteremia 08/23/2014  . History of Chiari malformation 08/23/2014  . 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 Fever   . Vaginal dryness 07/16/2014  . Chiari I malformation 05/10/2014  . Joint pain 10/02/2013  . Neuropathic pain 10/02/2013  . Onychomycosis of toenail 10/02/2013  . Frequent headaches 06/19/2013  . Anxiety as acute reaction to exceptional stress 05/22/2013  . Breast cancer of upper-outer quadrant of left female breast 04/06/2013  . Breast cancer 03/31/2013  . Rectovaginal fistula, proximal  03/21/2013  . Obesity (BMI 30-39.9) 12/14/2012  . GERD (gastroesophageal reflux disease) 12/14/2012  . Chronic depression 12/14/2012  . Heart murmur, systolic 09/02/2012  . Essential hypertension 09/02/2012  . Chest pain   . Palpitations   . DEGENERATIVE JOINT DISEASE, RIGHT KNEE 10/03/2009  . DISC DEGENERATION 10/24/2007    [name] [name], [place] MPT 10/15/2014, 11:22 AM  [facility]  [address] [place], [place], [address] Phone: [phone]   Fax:  [phone]

## 2014-10-16 ENCOUNTER — Other Ambulatory Visit (HOSPITAL_BASED_OUTPATIENT_CLINIC_OR_DEPARTMENT_OTHER): Payer: BLUE CROSS/BLUE SHIELD

## 2014-10-16 ENCOUNTER — Encounter: Payer: Self-pay | Admitting: Nurse Practitioner

## 2014-10-16 ENCOUNTER — Ambulatory Visit (HOSPITAL_BASED_OUTPATIENT_CLINIC_OR_DEPARTMENT_OTHER): Payer: BLUE CROSS/BLUE SHIELD | Admitting: Nurse Practitioner

## 2014-10-16 ENCOUNTER — Telehealth: Payer: Self-pay | Admitting: Oncology

## 2014-10-16 VITALS — BP 114/57 | HR 58 | Temp 98.2°F | Resp 18 | Ht 61.0 in | Wt 168.8 lb

## 2014-10-16 DIAGNOSIS — C50412 Malignant neoplasm of upper-outer quadrant of left female breast: Secondary | ICD-10-CM | POA: Diagnosis not present

## 2014-10-16 DIAGNOSIS — N898 Other specified noninflammatory disorders of vagina: Secondary | ICD-10-CM

## 2014-10-16 LAB — COMPREHENSIVE METABOLIC PANEL (CC13)
ALT: 14 U/L (ref 0–55)
AST: 12 U/L (ref 5–34)
Albumin: 3.8 g/dL (ref 3.5–5.0)
Alkaline Phosphatase: 53 U/L (ref 40–150)
Anion Gap: 9 mEq/L (ref 3–11)
BUN: 10.3 mg/dL (ref 7.0–26.0)
CO2: 28 mEq/L (ref 22–29)
Calcium: 9.5 mg/dL (ref 8.4–10.4)
Chloride: 107 mEq/L (ref 98–109)
Creatinine: 0.9 mg/dL (ref 0.6–1.1)
EGFR: 87 mL/min/{1.73_m2} — ABNORMAL LOW (ref >90)
Glucose: 86 mg/dl (ref 70–140)
Potassium: 3.7 mEq/L (ref 3.5–5.1)
Sodium: 145 mEq/L (ref 136–145)
Total Bilirubin: 0.39 mg/dL (ref 0.20–1.20)
Total Protein: 7.1 g/dL (ref 6.4–8.3)

## 2014-10-16 LAB — CBC WITH DIFFERENTIAL/PLATELET
BASO%: 0.7 % (ref 0.0–2.0)
Basophils Absolute: 0 10*3/uL (ref 0.0–0.1)
EOS%: 1.4 % (ref 0.0–7.0)
Eosinophils Absolute: 0.1 10*3/uL (ref 0.0–0.5)
HCT: 38.6 % (ref 34.8–46.6)
HGB: 12.4 g/dL (ref 11.6–15.9)
LYMPH%: 22 % (ref 14.0–49.7)
MCH: 28.2 pg (ref 25.1–34.0)
MCHC: 32.2 g/dL (ref 31.5–36.0)
MCV: 87.5 fL (ref 79.5–101.0)
MONO#: 0.5 10*3/uL (ref 0.1–0.9)
MONO%: 8.3 % (ref 0.0–14.0)
NEUT#: 4.1 10*3/uL (ref 1.5–6.5)
NEUT%: 67.6 % (ref 38.4–76.8)
Platelets: 186 10*3/uL (ref 145–400)
RBC: 4.42 10*6/uL (ref 3.70–5.45)
RDW: 14.8 % — ABNORMAL HIGH (ref 11.2–14.5)
WBC: 6.1 10*3/uL (ref 3.9–10.3)
lymph#: 1.3 10*3/uL (ref 0.9–3.3)

## 2014-10-16 NOTE — Progress Notes (Signed)
ID: [name] OB: [birth year]  MR#: [id number]  OZH#:[id number]  PCP: Rubbie Battiest, MD ; Pearson Forster, NP GYN:   Josefa Half, MD  SU: Alphonsa Overall, MD [place]:  Thea Silversmith, MD OTHER MD: Freida Busman, MD;  Neysa Bonito, MD  CHIEF COMPLAINT:  Left Breast Cancer CURRENT TREATMENT: Anastrozole daily  BREAST CANCER HISTORY: From the original intake note 04/12/2013:  Addy palpated a mass in her left breast in late August 2014. She was already scheduled for screening mammography at [facility] and this was performed 03/13/2013. Indeed a possible mass was noted in the left breast and on 03/29/2013 the patient underwent diagnostic left mammography and left ultrasonography. This showed a 2.5 cm irregular mass in the upper outer quadrant of the left breast. This was palpable.by ultrasound there was a 1.6 cm irregular hypoechoic mass in the area in question, and in addition to enlarged left axillary lymph nodes were noted, the largest measuring 2.5 cm.  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  Bilateral breast MRI 04/11/2013 shows 3 abnormal enhancing masses in the upper outer quadrant of the left breast measuring altogether 6.4 cm. The largest separate mass measured 2.7 cm. There were abnormal or large lymph nodes in the left axilla. Ultrasound guided biopsy of the 2 smaller masses has been scheduled.  The patient's subsequent history is as detailed below   INTERVAL HISTORY: Ahnika returns today for follow up of her left breast cancer. She has been on anastrozole since July 2015 and continues to tolerate it moderately well. She denies hot  flashes, but has some arthralgias and vaginal dryness. She does not take anything for the arthralgias but does use coconut oil on occasion for her vaginal dryness. The interval history is remarkable for a hospitalization in February for meningitis. The patient states they were never able to determine if it was bacterial or viral. Then in March she contracted the flu.   REVIEW OF SYSTEMS: Mylina denies fevers, chills, nausea, vomiting, or changes in bowel or bladder habits. She has no shortness of breath, chest pain, cough, or palpitations. She denies dizziness, unexplained weight loss, fatigue, or night sweats. She has a history of headaches, and these continue. She is in physical therapy a few times a week for her sustained neck pain since her meningitis episode. This is a long term care plan for her. She walks outside when the weather is nice for exercise. A detailed review of systems is otherwise negative.   PAST MEDICAL HISTORY: Past Medical History  Diagnosis Date  . Chest pain 2009    Consultation-Rothbart, negative chest CT; nl echo in 2005; h/o palpitations  . Palpitations   . Degenerative joint disease     + degenerative joint disease of the lumbosacral spine  . Colitis 2010    not IBD  . Herpes simplex type II infection   . Allergic rhinitis   . Hypercholesteremia     "slightly high"  . Anxiety   . Heart murmur     "small"  . GERD (gastroesophageal reflux disease)     "a little"  . Depression   . Anemia, unspecified 05/30/2013  .  Wears glasses   . Breast cancer 03/31/13    left  . Cancer   . Dysrhythmia     palpations  . Hypertension     does not have high blood pressure  . Headache   . Meningitis 08/22/2014    PAST SURGICAL HISTORY: Past Surgical History  Procedure Laterality Date  . Colonoscopy  10/2010    proctitis; melanosis coli  . Tubal ligation    . Breast excisional biopsy  04/2003    Left; benign disease  . Right oophorectomy  03/13/2007  . Abdominal  hysterectomy  03/13/2007    TAH ?BSO--Dr Levin Bacon, [place]  . Eye surgery Left     "fix lazy eye"  . Portacath placement Right 04/21/2013    Procedure: INSERTION PORT-A-CATH;  Surgeon: [name], MD;  Location: [facility];  Service: General;  Laterality: Right;  . Breast lumpectomy with needle localization and axillary lymph node dissection Left 09/12/2013    Procedure: BREAST LUMPECTOMY WITH NEEDLE LOCALIZATION AND AXILLARY LYMPH NODE DISSECTION;  Surgeon: [name], MD;  Location: [place];  Service: General;  Laterality: Left;  and axilla  . Breast surgery    . Suboccipital craniectomy cervical laminectomy N/A 05/10/2014    Procedure: SUBOCCIPITAL CRANIECTOMY CERVICAL LAMINECTOMY/DURAPLASTY;  Surgeon: [name], MD;  Location: [facility];  Service: Neurosurgery;  Laterality: N/A;  suboccipital craniectomy with cervical laminectomy and duraplasty    FAMILY HISTORY Family History  Problem Relation Age of Onset  . Aneurysm Mother     Cerebral  . Hypertension Mother   . Hyperlipidemia Mother   . Stroke Mother   . Coronary artery disease Father   . Hypertension Father   . Hyperlipidemia Father   . Lung cancer Brother   . Cancer Brother     Lung  The patient's father died at the age of 62 from a myocardial infarction. The patient's mother died at the age of 54 from a stroke. The patient had 6 brothers, 2 sisters. One brother has a history of lung cancer in the setting of tobacco abuse. Another brother had multiple myeloma. There is no history of breast or ovarian cancer in the family  GYNECOLOGIC HISTORY: (Updated 09/25/2013) Menarche age 48, first live birth age 51, the patient is [place] P4. She status post abdominal hysterectomy with bilateral salpingo-oophorectomy. She never took hormone replacement.   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 Her husband [name] works in Charity fundraiser. Daughter  [name] formerly lived at home with the patient with her 3 children, all under the age of 68; they have subsequently moved out. She is not employed. Son Eulene Pekar lives in [place] and is not employed. Daughter Maggie Schwalbe Dillard lives in [place] and is a Barrister's clerk. Daughter Ernst Spell is a Education officer, museum in [place]. The patient has 10 grandchildren. She attends [employer]     ADVANCED DIRECTIVES: Not in place   HEALTH MAINTENANCE:  (Updated 09/25/2013)  History  Substance Use Topics  . Smoking status: Former Smoker -- 0.50 packs/day for 15 years    Types: Cigarettes    Quit date: 07/06/1982  . Smokeless tobacco: Never Used  . Alcohol Use: No     Colonoscopy:2012  PAP:2014   Bone density: Never  Lipid panel: Not on file    Allergies  Allergen Reactions  . Doxycycline Other (See Comments)  Unknown    Current Outpatient Prescriptions  Medication Sig Dispense Refill  . ALPRAZolam (XANAX) 1 MG tablet Take 1 mg by mouth at bedtime.    Marland Kitchen anastrozole (ARIMIDEX) 1 MG tablet Take 1 tablet (1 mg total) by mouth daily. 90 tablet 4  . atenolol (TENORMIN) 50 MG tablet TAKE 1 TABLET BY MOUTH DAILY 90 tablet 0  . buPROPion (WELLBUTRIN SR) 150 MG 12 hr tablet One q am for three d, then one po bid 60 tablet 5  . celecoxib (CELEBREX) 200 MG capsule Take 200 mg by mouth daily as needed for mild pain.     . Cholecalciferol (VITAMIN D PO) Take 1 tablet by mouth daily.    . diphenhydramine-acetaminophen (TYLENOL PM) 25-500 MG TABS Take 2 tablets by mouth at bedtime.    Marland Kitchen escitalopram (LEXAPRO) 20 MG tablet Take 1 tablet (20 mg total) by mouth daily. 90 tablet 0  . pantoprazole (PROTONIX) 40 MG tablet Take 1 tablet (40 mg total) by mouth as needed.    . valACYclovir (VALTREX) 1000 MG tablet Take 1,000 mg by mouth daily.    Marland Kitchen acetaminophen (TYLENOL) 500 MG tablet Take 500 mg by mouth every 6 (six) hours as needed for moderate pain.     .  [DISCONTINUED] potassium chloride SA (K-DUR,KLOR-CON) 20 MEQ tablet Take 1 tablet (20 mEq total) by mouth 2 (two) times daily. 20 tablet 1   No current facility-administered medications for this visit.    OBJECTIVE: Middle-aged [place] American woman who appears stated age 54 Vitals:   10/16/14 0830  BP: 114/57  Pulse: 58  Temp: 98.2 F (36.8 C)  Resp: 18  Body mass index is 31.91 kg/(m^2).  ECOG: 1 Filed Weights   10/16/14 0830  Weight: 168 lb 12.8 oz (76.567 kg)   Skin: warm, dry  HEENT: sclerae anicteric, conjunctivae pink, oropharynx clear. No thrush or mucositis.  Lymph Nodes: No cervical or supraclavicular lymphadenopathy  Lungs: clear to auscultation bilaterally, no rales, wheezes, or rhonci  Heart: regular rate and rhythm  Abdomen: round, soft, non tender, positive bowel sounds  Musculoskeletal: No focal spinal tenderness, no peripheral edema  Neuro: non focal, well oriented, positive affect  Breasts: left breast status post lumpectomy and radiation. No evidence of recurrent disease. Left axilla benign. Right breast unremarkable. Unable to palpate thickening noted in most recent mammogram   LAB RESULTS:   Lab Results  Component Value Date   WBC 6.1 10/16/2014   NEUTROABS 4.1 10/16/2014   HGB 12.4 10/16/2014   HCT 38.6 10/16/2014   MCV 87.5 10/16/2014   PLT 186 10/16/2014      Chemistry      Component Value Date/Time   NA 145 10/16/2014 0814   NA 139 08/23/2014 0643   K 3.7 10/16/2014 0814   K 3.6 08/23/2014 0643   CL 105 08/23/2014 0643   CO2 28 10/16/2014 0814   CO2 28 08/23/2014 0643   BUN 10.3 10/16/2014 0814   BUN 10 08/23/2014 0643   CREATININE 0.9 10/16/2014 0814   CREATININE 0.96 08/23/2014 0643      Component Value Date/Time   CALCIUM 9.5 10/16/2014 0814   CALCIUM 8.8 08/23/2014 0643   ALKPHOS 53 10/16/2014 0814   ALKPHOS 54 08/22/2014 1138   AST 12 10/16/2014 0814   AST 19 08/22/2014 1138   ALT 14 10/16/2014 0814   ALT 24 08/22/2014  1138   BILITOT 0.39 10/16/2014 0814   BILITOT 0.7 08/22/2014 1138  STUDIES: No results found.  Most recent mammogram on 07/25/14 showed a probable benign area of scar tissue to the upper-inner quadrant of the right breast  ASSESSMENT/PLAN: 54 y.o. [place], [place] woman   (1)  status post left breast biopsy 03/29/2013 for a clinical T2-T3 pN1, stage IIB-IIIA invasive ductal carcinoma, grade 2-3, estrogen receptor 39% positive (with moderate staining intensity), progesterone receptor negative, with an MIB-1 of 83%, and HER-2 amplified by CISH, with a ratio of 7.5 and 15 HER-2 copies per cell  (2) neoadjuvant chemotherapy, with trastuzumab/ pertuzumab/ carboplatin/ docetaxel repeated every 3 weeks x6, completed 08/21/2013. Pertuzumab  held after 4 cycles secondary to diarrhea. The patient received Neulasta on day 2 at [facility] per her request.   (3) Trastuzumab to be continued for total of one year (to mid October 2015). Most recent echo 03/01/2014 showed a well preserved ejection fraction.  (4)  status post left lumpectomy and axillary node dissection 09/12/2013, with a complete pathologic response (a total of 16 lymph nodes were examined,1 sentinel and 15 non-sentinel),  YpT0, ypN0.  (5) adjuvant radiation at [facility] completed 12/08/2013  (6) to start anastrozole 01/16/2014  (7) status post remote TAH/ BSO  PLAN: Alainah continues to do well on her antiestrogen treatment. She is tolerating the anastrozole well and the goal is to continue this drug for at least 5 years of antiestrogen therapy. She will continue to manage her vaginal dryness with coconut oil PRN  Shalece will return in 6 months for labs and a follow up visit. She will have a repeat right mammogram in July to follow a probable benign area of scar tissue. She understands and agrees with this plan. She knows the goal of treatment in her case is cure. She has been encouraged to call with any issues that might  arise before her next visit here.  Laurie Panda, NP  10/16/2014 9:29 AM

## 2014-10-16 NOTE — Telephone Encounter (Signed)
Appointments made and avs printed for patient °

## 2014-10-17 NOTE — Addendum Note (Signed)
Addended by: Marcelino Duster on: 10/17/2014 01:40 PM   Modules accepted: Orders

## 2014-10-19 ENCOUNTER — Encounter: Payer: Self-pay | Admitting: Family Medicine

## 2014-10-19 ENCOUNTER — Other Ambulatory Visit: Payer: Self-pay | Admitting: Family Medicine

## 2014-10-19 ENCOUNTER — Ambulatory Visit (INDEPENDENT_AMBULATORY_CARE_PROVIDER_SITE_OTHER): Payer: BLUE CROSS/BLUE SHIELD | Admitting: Family Medicine

## 2014-10-19 VITALS — BP 124/80 | Ht 61.0 in | Wt 167.5 lb

## 2014-10-19 DIAGNOSIS — E785 Hyperlipidemia, unspecified: Secondary | ICD-10-CM | POA: Diagnosis not present

## 2014-10-19 DIAGNOSIS — Z79899 Other long term (current) drug therapy: Secondary | ICD-10-CM | POA: Diagnosis not present

## 2014-10-19 MED ORDER — PRAVASTATIN SODIUM 20 MG PO TABS: 20.0000 mg | ORAL_TABLET | Freq: Every day | ORAL | Status: DC

## 2014-10-19 NOTE — Progress Notes (Signed)
   Subjective:    Patient ID: [name] [name], female    DOB: Apr 21, 1961, 54 y.o.   MRN: [id number]  HPI Patient is here today for a follow up visit on depression. Patient is currently taking Wellbutrin daily. Patient states that she has not noticed any improvement since starting this medication.  Brother passed away from multiple meloma, this is added to her stress level    Patient states that she also had bloodwork completed.   Due to see dr Purnell Shoemaker in may, and other specialist later this mo   Lipid was true fsting, patient states has been trying to work hard on her diet.  A lot of ongoing stress current cancer management.  Results for orders placed or performed in visit on 10/16/14  CBC with Differential  Result Value Ref Range   WBC 6.1 3.9 - 10.3 10e3/uL   NEUT# 4.1 1.5 - 6.5 10e3/uL   HGB 12.4 11.6 - 15.9 g/dL   HCT 38.6 34.8 - 46.6 %   Platelets 186 145 - 400 10e3/uL   MCV 87.5 79.5 - 101.0 fL   MCH 28.2 25.1 - 34.0 pg   MCHC 32.2 31.5 - 36.0 g/dL   RBC 4.42 3.70 - 5.45 10e6/uL   RDW 14.8 (H) 11.2 - 14.5 %   lymph# 1.3 0.9 - 3.3 10e3/uL   MONO# 0.5 0.1 - 0.9 10e3/uL   Eosinophils Absolute 0.1 0.0 - 0.5 10e3/uL   Basophils Absolute 0.0 0.0 - 0.1 10e3/uL   NEUT% 67.6 38.4 - 76.8 %   LYMPH% 22.0 14.0 - 49.7 %   MONO% 8.3 0.0 - 14.0 %   EOS% 1.4 0.0 - 7.0 %   BASO% 0.7 0.0 - 2.0 %  Comprehensive metabolic panel (Cmet) - [facility]  Result Value Ref Range   Sodium 145 136 - 145 mEq/L   Potassium 3.7 3.5 - 5.1 mEq/L   Chloride 107 98 - 109 mEq/L   CO2 28 22 - 29 mEq/L   Glucose 86 70 - 140 mg/dl   BUN 10.3 7.0 - 26.0 mg/dL   Creatinine 0.9 0.6 - 1.1 mg/dL   Total Bilirubin 0.39 0.20 - 1.20 mg/dL   Alkaline Phosphatase 53 40 - 150 U/L   AST 12 5 - 34 U/L   ALT 14 0 - 55 U/L   Total Protein 7.1 6.4 - 8.3 g/dL   Albumin 3.8 3.5 - 5.0 g/dL   Calcium 9.5 8.4 - 10.4 mg/dL   Anion Gap 9 3 - 11 mEq/L   EGFR 87 (L) >90 ml/min/1.73 m2    exercisign three days per wk,  regulaly    Review of Systems Ongoing headaches no chest pain currently no abdominal pain no change in bowel habits no blood in stool    Objective:   Physical Exam  Alert mild malaise somewhat subdued. HEENT mom his congestion lungs clear heart regular in rhythm. Ankles without edema.      Assessment & Plan:  Impression depression minimal improvement but dealing with substantial difficulties see #2. #2 acute grief with loss of brother. #3 hyperlipidemia discussed. The patient facing very serious interventions at this time, there will come a time when this will calm down and she will 1 her cholesterol better control. Discussed at length. Plan initiate Pravachol 20 mg. Exercise strongly encourage. Maintain other antidepressants. 25 minutes spent most in discussion WSL

## 2014-10-22 ENCOUNTER — Encounter: Payer: Self-pay | Admitting: Physical Therapy

## 2014-10-22 ENCOUNTER — Ambulatory Visit: Payer: BLUE CROSS/BLUE SHIELD | Admitting: Physical Therapy

## 2014-10-22 DIAGNOSIS — M542 Cervicalgia: Secondary | ICD-10-CM

## 2014-10-22 DIAGNOSIS — M25551 Pain in right hip: Secondary | ICD-10-CM | POA: Diagnosis not present

## 2014-10-22 DIAGNOSIS — M25552 Pain in left hip: Principal | ICD-10-CM

## 2014-10-22 NOTE — Patient Instructions (Signed)
Strengthening: Hip Abduction (Side-Lying)   Tighten muscles on front of left thigh, then lift leg _5___ inches from surface, keeping knee locked.  Repeat __10__ times per set. Do _1-2___ sets per session. Do _1-2___ sessions per day.  http://orth.exer.us/622   Copyright  VHI. All rights reserved.   Stretching: Hamstring (Supine)   Supporting right thigh behind knee, slowly straighten knee until stretch is felt in back of thigh. Hold _20___ seconds. Repeat __3-5__ times per set. Do _1-2___ sets per session. Do __2-3__ sessions per day.  http://orth.exer.us/656   Copyright  VHI. All rights reserved.  Piriformis (Supine)   Cross legs, right on top. Gently pull other knee toward chest until stretch is felt in buttock/hip of top leg. Hold _20___ seconds. Repeat _3-5___ times per set. Do __1-2__ sets per session. Do _2-3___ sessions per day.  http://orth.exer.us/676   Copyright  VHI. All rights reserved.  Stretching: Tensor   Cross right leg over the other, then lean to same side until stretch is felt on other hip. Hold __20__ seconds. Repeat _2-5___ times per set. Do __1-2__ sets per session. Do _2-3___ sessions per day.  http://orth.exer.us/652   Copyright  VHI. All rights reserved.

## 2014-10-22 NOTE — Therapy (Signed)
[facility] [address] [place], [place], [address] Phone: [phone]   Fax:  [phone]  Physical Therapy Treatment  Patient Details  Name: [name] [name] MRN: [id number] Date of Birth: Jan 16, 1961 Referring Provider:  Merlyn Albert, MD  Encounter Date: 10/22/2014      PT End of Session - 10/22/14 0832    Visit Number 2   Number of Visits 12   Date for PT Re-Evaluation 12/10/14   PT Start Time 0815   PT Stop Time (p) 0859   PT Time Calculation (min) (p) 44 min   Activity Tolerance (p) Patient tolerated treatment well   Behavior During Therapy (p) WFL for tasks assessed/performed      Past Medical History  Diagnosis Date  . Chest pain 2009    Consultation-Rothbart, negative chest CT; nl echo in 2005; h/o palpitations  . Palpitations   . Degenerative joint disease     + degenerative joint disease of the lumbosacral spine  . Colitis 2010    not IBD  . Herpes simplex type II infection   . Allergic rhinitis   . Hypercholesteremia     "slightly high"  . Anxiety   . Heart murmur     "small"  . GERD (gastroesophageal reflux disease)     "a little"  . Depression   . Anemia, unspecified 05/30/2013  . Wears glasses   . Breast cancer 03/31/13    left  . Cancer   . Dysrhythmia     palpations  . Hypertension     does not have high blood pressure  . Headache   . 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 Colonoscopy  10/2010    proctitis; melanosis coli  . Tubal ligation    . Breast excisional biopsy  04/2003    Left; benign disease  . Right oophorectomy  03/13/2007  . Abdominal hysterectomy  03/13/2007    TAH ?BSO--Dr Cheron Every, [facility]  . Eye surgery Left     "fix lazy eye"  . Portacath placement Right 04/21/2013    Procedure: INSERTION PORT-A-CATH;  Surgeon: Kandis Cocking, MD;  Location: [facility];  Service: General;  Laterality: Right;  . Breast lumpectomy with needle  localization and axillary lymph node dissection Left 09/12/2013    Procedure: BREAST LUMPECTOMY WITH NEEDLE LOCALIZATION AND AXILLARY LYMPH NODE DISSECTION;  Surgeon: Kandis Cocking, MD;  Location: [facility];  Service: General;  Laterality: Left;  and axilla  . Breast surgery    . Suboccipital craniectomy cervical laminectomy N/A 05/10/2014    Procedure: SUBOCCIPITAL CRANIECTOMY CERVICAL LAMINECTOMY/DURAPLASTY;  Surgeon: Hewitt Shorts, MD;  Location: [facility];  Service: Neurosurgery;  Laterality: N/A;  suboccipital craniectomy with cervical laminectomy and duraplasty    There were no vitals filed for this visit.  Visit Diagnosis:  Bilateral hip pain  Neck pain      Subjective Assessment - 10/22/14 0824    Subjective some soreness today in bil hips   Limitations Walking   How long can you walk comfortably? one mile.   Currently in Pain? Yes   Pain Score 3    Pain Location Hip   Pain Orientation Right;Left   Pain Descriptors / Indicators Sore   Pain Type Chronic pain   Pain Frequency Intermittent   Aggravating Factors  activity   Pain Relieving Factors rest  [facility] Adult PT Treatment/Exercise - 10/22/14 0001    Moist Heat Therapy   Number Minutes Moist Heat 15 Minutes   Electrical Stimulation   Electrical Stimulation Location Bilateral hips.   Electrical Stimulation Action premod   Electrical Stimulation Parameters 80-150HZ    Electrical Stimulation Goals Pain   Ultrasound   Ultrasound Location Bil hips    Ultrasound Parameters 1.5w/cm2/50%/49mhz x 12 min   Ultrasound Goals Pain   Manual Therapy   Manual Therapy Myofascial release   Myofascial Release IASTW to BIL Hips                PT Education - 10/22/14 0831    Education Details HEP - HIP EX/Stretch   Person(s) Educated Patient   Methods Explanation;Demonstration;Handout   Comprehension Verbalized understanding;Returned demonstration              PT Long Term Goals - 10/15/14 1104    PT LONG TERM GOAL #1   Title Ind with HEP.   Time 4   Period Weeks   Status New   PT LONG TERM GOAL #2   Title Sleep 6 hours undisturbed.   Time 4   Period Weeks   PT LONG TERM GOAL #3   Title Perform ADL's with pain not > 2/10               Plan - 10/22/14 0915    Clinical Impression Statement patient tolerated tx well today. patient understands HEP and wanted to do them at home, yet gave demo today for understanding. patient has very palpable pain. golas ongoing   Pt will benefit from skilled therapeutic intervention in order to improve on the following deficits Pain;Decreased activity tolerance   Rehab Potential Excellent   Clinical Impairments Affecting Rehab Potential Pecautions/Contraindication: Chiari Malformation   PT Frequency 3x / week   PT Duration 4 weeks   PT Treatment/Interventions ADLs/Self Care Home Management;Moist Heat;Therapeutic activities;Patient/family education;Therapeutic exercise;Ultrasound;Manual techniques;Cryotherapy;Electrical Stimulation   PT Next Visit Plan cont with POC   Consulted and Agree with Plan of Care Patient        Problem List Patient Active Problem List   Diagnosis Date Noted  . Depression 08/23/2014  . Anxiety 08/23/2014  . Heart murmur 08/23/2014  . Hypercholesteremia 08/23/2014  . History of Chiari malformation 08/23/2014  . 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 Fever   . Vaginal dryness 07/16/2014  . Chiari I malformation 05/10/2014  . Joint pain 10/02/2013  . Neuropathic pain 10/02/2013  . Onychomycosis of toenail 10/02/2013  . Frequent headaches 06/19/2013  . Anxiety as acute reaction to exceptional stress 05/22/2013  . Breast cancer of upper-outer quadrant of left female breast 04/06/2013  . Breast cancer 03/31/2013  . 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 Obesity (BMI 30-39.9) 12/14/2012  . GERD (gastroesophageal reflux disease) 12/14/2012  . Chronic depression 12/14/2012   . Heart murmur, systolic 09/02/2012  . Essential hypertension 09/02/2012  . Chest pain   . Palpitations   . DEGENERATIVE JOINT DISEASE, RIGHT KNEE 10/03/2009  . DISC DEGENERATION 10/24/2007    Mandolin Falwell P, PTA 10/22/2014, 9:19 AM  [facility] [address] [place], [place], [address] Phone: [phone]   Fax:  [phone]

## 2014-10-24 ENCOUNTER — Encounter: Payer: Self-pay | Admitting: Physical Therapy

## 2014-10-24 ENCOUNTER — Other Ambulatory Visit: Payer: Self-pay | Admitting: Family Medicine

## 2014-10-24 ENCOUNTER — Ambulatory Visit: Payer: BLUE CROSS/BLUE SHIELD | Admitting: Physical Therapy

## 2014-10-24 DIAGNOSIS — M25551 Pain in right hip: Secondary | ICD-10-CM

## 2014-10-24 DIAGNOSIS — M25552 Pain in left hip: Principal | ICD-10-CM

## 2014-10-24 NOTE — Therapy (Addendum)
[facility] [address] [place], [place], [address] Phone: [phone]   Fax:  [phone]  Physical Therapy Treatment  Patient Details  Name: [name] [name] MRN: [id number] Date of Birth: 1961-03-22 Referring Provider:  Merlyn Albert, MD  Encounter Date: 10/24/2014      PT End of Session - 10/24/14 0915    Visit Number 3   Number of Visits 12   Date for PT Re-Evaluation 12/10/14   PT Start Time 0902   PT Stop Time 0926   PT Time Calculation (min) 24 min   Activity Tolerance Patient tolerated treatment well   Behavior During Therapy [facility] for tasks assessed/performed      Past Medical History  Diagnosis Date  . Chest pain 2009    Consultation-Rothbart, negative chest CT; nl echo in 2005; h/o palpitations  . Palpitations   . Degenerative joint disease     + degenerative joint disease of the lumbosacral spine  . Colitis 2010    not IBD  . Herpes simplex type II infection   . Allergic rhinitis   . Hypercholesteremia     "slightly high"  . Anxiety   . Heart murmur     "small"  . GERD (gastroesophageal reflux disease)     "a little"  . Depression   . Anemia, unspecified 05/30/2013  . Wears glasses   . Breast cancer 03/31/13    left  . Cancer   . Dysrhythmia     palpations  . Hypertension     does not have high blood pressure  . Headache   . 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 Colonoscopy  10/2010    proctitis; melanosis coli  . Tubal ligation    . Breast excisional biopsy  04/2003    Left; benign disease  . Right oophorectomy  03/13/2007  . Abdominal hysterectomy  03/13/2007    TAH ?BSO--Dr Cheron Every, [place]  . Eye surgery Left     "fix lazy eye"  . Portacath placement Right 04/21/2013    Procedure: INSERTION PORT-A-CATH;  Surgeon: Kandis Cocking, MD;  Location: [facility];  Service: General;  Laterality: Right;  . Breast lumpectomy with needle localization and axillary  lymph node dissection Left 09/12/2013    Procedure: BREAST LUMPECTOMY WITH NEEDLE LOCALIZATION AND AXILLARY LYMPH NODE DISSECTION;  Surgeon: Kandis Cocking, MD;  Location: [facility];  Service: General;  Laterality: Left;  and axilla  . Breast surgery    . Suboccipital craniectomy cervical laminectomy N/A 05/10/2014    Procedure: SUBOCCIPITAL CRANIECTOMY CERVICAL LAMINECTOMY/DURAPLASTY;  Surgeon: Hewitt Shorts, MD;  Location: [facility];  Service: Neurosurgery;  Laterality: N/A;  suboccipital craniectomy with cervical laminectomy and duraplasty    There were no vitals filed for this visit.  Visit Diagnosis:  Bilateral hip pain      Subjective Assessment - 10/24/14 0914    Subjective Started that she went walking yesterday and it went good for her. States that HEP exercises are going well for her.   Currently in Pain? No/denies            [facility] PT Assessment - 10/24/14 0001    Assessment   Medical Diagnosis Bilateral hip pain.                     [facility] Adult PT Treatment/Exercise - 10/24/14 0001    Modalities   Modalities Electrical Stimulation;Moist Heat;Ultrasound  Moist Heat Therapy   Number Minutes Moist Heat 20 Minutes   Moist Heat Location Other (comment)  Low back/ bilateral hips   Electrical Stimulation   Electrical Stimulation Location Bilateral hips.   Electrical Stimulation Action Pre-mod   Electrical Stimulation Parameters 80-150 Hz   Electrical Stimulation Goals Pain   Ultrasound   Ultrasound Location Bilateral trochanteric bursa region   Ultrasound Parameters 1/5 w/cm2, 100%, 1 mhz x 10 min   Ultrasound Goals Pain   Manual Therapy   Manual Therapy Myofascial release   Myofascial Release IASTW/ STW to bilateral hips                     PT Long Term Goals - 10/24/14 0915    PT LONG TERM GOAL #1   Title Ind with HEP.   Time 4   Period Weeks   Status On-going   PT LONG TERM GOAL #2   Title Sleep 6 hours  undisturbed.   Time 4   Period Weeks   Status On-going   PT LONG TERM GOAL #3   Title Perform ADL's with pain not > 2/10   Time 6   Period Weeks   Status On-going      PHYSICAL THERAPY DISCHARGE SUMMARY  Visits from Start of Care: 3  Current functional level related to goals / functional outcomes: Goals ongoing.   Remaining deficits: Continued pain but patient request d/c to HEP.   Education / Equipment: HEP. Plan: Patient agrees to discharge.  Patient goals were not met. Patient is being discharged due to being pleased with the current functional level.  ?????              Plan - 10/24/14 0931    Clinical Impression Statement Patient tolerated e-stim and moist heat well and wishes to complete her exercises at home. Patient communicated that STW irritated her pain and did not want any other treatment techniques today.   Pt will benefit from skilled therapeutic intervention in order to improve on the following deficits Pain;Decreased activity tolerance   Rehab Potential Excellent   Clinical Impairments Affecting Rehab Potential Pecautions/Contraindication: Chiari Malformation   PT Frequency 3x / week   PT Duration 4 weeks   PT Treatment/Interventions ADLs/Self Care Home Management;Moist Heat;Therapeutic activities;Patient/family education;Therapeutic exercise;Ultrasound;Manual techniques;Cryotherapy;Electrical Stimulation   PT Next Visit Plan Communicate with MPT regarding patient's wishes for treatment and status of physical therapy.   Consulted and Agree with Plan of Care Patient        Problem List Patient Active Problem List   Diagnosis Date Noted  . Depression 08/23/2014  . Anxiety 08/23/2014  . Heart murmur 08/23/2014  . Hypercholesteremia 08/23/2014  . History of Chiari malformation 08/23/2014  . 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 Fever   . Vaginal dryness 07/16/2014  . Chiari I malformation 05/10/2014  . Joint pain 10/02/2013  . Neuropathic pain 10/02/2013   . Onychomycosis of toenail 10/02/2013  . Frequent headaches 06/19/2013  . Anxiety as acute reaction to exceptional stress 05/22/2013  . Breast cancer of upper-outer quadrant of left female breast 04/06/2013  . Breast cancer 03/31/2013  . 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 Obesity (BMI 30-39.9) 12/14/2012  . GERD (gastroesophageal reflux disease) 12/14/2012  . Chronic depression 12/14/2012  . Heart murmur, systolic 09/02/2012  . Essential hypertension 09/02/2012  . Chest pain   . Palpitations   . DEGENERATIVE JOINT DISEASE, RIGHT KNEE 10/03/2009  . DISC DEGENERATION 10/24/2007    Lizzy Hamre, [place] MPT 10/24/2014,  2:10 PM  [facility] [address] [place], [place], [address] Phone: [phone]   Fax:  [phone]

## 2014-10-24 NOTE — Therapy (Signed)
[facility] [place], [place], [address] Phone: [phone]   Fax:  [phone]  Physical Therapy Treatment  Patient Details  Name: [name] [name] MRN: [id number] Date of Birth: 03-12-1961 Referring Provider:  Mikey Kirschner, MD  Encounter Date: 10/24/2014      PT End of Session - 10/24/14 0915    Visit Number 3   Number of Visits 12   Date for PT Re-Evaluation 12/10/14   PT Start Time 0902   PT Stop Time 0926   PT Time Calculation (min) 24 min   Activity Tolerance Patient tolerated treatment well   Behavior During Therapy [facility] for tasks assessed/performed      Past Medical History  Diagnosis Date  . Chest pain 2009    Consultation-Rothbart, negative chest CT; nl echo in 2005; h/o palpitations  . Palpitations   . Degenerative joint disease     + degenerative joint disease of the lumbosacral spine  . Colitis 2010    not IBD  . Herpes simplex type II infection   . Allergic rhinitis   . Hypercholesteremia     "slightly high"  . Anxiety   . Heart murmur     "small"  . GERD (gastroesophageal reflux disease)     "a little"  . Depression   . Anemia, unspecified 05/30/2013  . Wears glasses   . Breast cancer 03/31/13    left  . Cancer   . Dysrhythmia     palpations  . Hypertension     does not have high blood pressure  . Headache   . 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 Colonoscopy  10/2010    proctitis; melanosis coli  . Tubal ligation    . Breast excisional biopsy  04/2003    Left; benign disease  . Right oophorectomy  03/13/2007  . Abdominal hysterectomy  03/13/2007    TAH ?BSO--Dr Levin Bacon, [place]  . Eye surgery Left     "fix lazy eye"  . Portacath placement Right 04/21/2013    Procedure: INSERTION PORT-A-CATH;  Surgeon: [name], MD;  Location: [facility];  Service: General;  Laterality: Right;  . Breast lumpectomy with needle localization and axillary  lymph node dissection Left 09/12/2013    Procedure: BREAST LUMPECTOMY WITH NEEDLE LOCALIZATION AND AXILLARY LYMPH NODE DISSECTION;  Surgeon: [name], MD;  Location: [place];  Service: General;  Laterality: Left;  and axilla  . Breast surgery    . Suboccipital craniectomy cervical laminectomy N/A 05/10/2014    Procedure: SUBOCCIPITAL CRANIECTOMY CERVICAL LAMINECTOMY/DURAPLASTY;  Surgeon: [name], MD;  Location: [place] [facility];  Service: Neurosurgery;  Laterality: N/A;  suboccipital craniectomy with cervical laminectomy and duraplasty    There were no vitals filed for this visit.  Visit Diagnosis:  Bilateral hip pain      Subjective Assessment - 10/24/14 0914    Subjective Started that she went walking yesterday and it went good for her. States that HEP exercises are going well for her.   Currently in Pain? No/denies            [facility] PT Assessment - 10/24/14 0001    Assessment   Medical Diagnosis Bilateral hip pain.                     [facility] Adult PT Treatment/Exercise - 10/24/14 0001    Modalities   Modalities Electrical Stimulation;Moist Heat;Ultrasound  Moist Heat Therapy   Number Minutes Moist Heat 20 Minutes   Moist Heat Location Other (comment)  Low back/ bilateral hips   Electrical Stimulation   Electrical Stimulation Location Bilateral hips.   Electrical Stimulation Action Pre-mod   Electrical Stimulation Parameters 80-150 Hz   Electrical Stimulation Goals Pain   Ultrasound   Ultrasound Location Bilateral trochanteric bursa region   Ultrasound Parameters 1/5 w/cm2, 100%, 1 mhz x 10 min   Ultrasound Goals Pain   Manual Therapy   Manual Therapy Myofascial release   Myofascial Release IASTW/ STW to bilateral hips                     PT Long Term Goals - 10/24/14 0915    PT LONG TERM GOAL #1   Title Ind with HEP.   Time 4   Period Weeks   Status On-going   PT LONG TERM GOAL #2   Title Sleep 6 hours  undisturbed.   Time 4   Period Weeks   Status On-going   PT LONG TERM GOAL #3   Title Perform ADL's with pain not > 2/10   Time 6   Period Weeks   Status On-going               Plan - 10/24/14 0931    Clinical Impression Statement Patient tolerated e-stim and moist heat well and wishes to complete her exercises at home. Patient communicated that STW irritated her pain and did not want any other treatment techniques today.   Pt will benefit from skilled therapeutic intervention in order to improve on the following deficits Pain;Decreased activity tolerance   Rehab Potential Excellent   Clinical Impairments Affecting Rehab Potential Pecautions/Contraindication: Chiari Malformation   PT Frequency 3x / week   PT Duration 4 weeks   PT Treatment/Interventions ADLs/Self Care Home Management;Moist Heat;Therapeutic activities;Patient/family education;Therapeutic exercise;Ultrasound;Manual techniques;Cryotherapy;Electrical Stimulation   PT Next Visit Plan Communicate with MPT regarding patient's wishes for treatment and status of physical therapy.   Consulted and Agree with Plan of Care Patient        Problem List Patient Active Problem List   Diagnosis Date Noted  . Depression 08/23/2014  . Anxiety 08/23/2014  . Heart murmur 08/23/2014  . Hypercholesteremia 08/23/2014  . History of Chiari malformation 08/23/2014  . 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 Fever   . Vaginal dryness 07/16/2014  . Chiari I malformation 05/10/2014  . Joint pain 10/02/2013  . Neuropathic pain 10/02/2013  . Onychomycosis of toenail 10/02/2013  . Frequent headaches 06/19/2013  . Anxiety as acute reaction to exceptional stress 05/22/2013  . Breast cancer of upper-outer quadrant of left female breast 04/06/2013  . Breast cancer 03/31/2013  . 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 Obesity (BMI 30-39.9) 12/14/2012  . GERD (gastroesophageal reflux disease) 12/14/2012  . Chronic depression 12/14/2012  .  Heart murmur, systolic 62/83/1517  . Essential hypertension 09/02/2012  . Chest pain   . Palpitations   . DEGENERATIVE JOINT DISEASE, RIGHT KNEE 10/03/2009  . [place] DEGENERATION 10/24/2007    Wynelle Fanny, PTA 10/24/2014, 9:59 AM  [facility] [address] [place], [place], [address] Phone: [phone]   Fax:  [phone]

## 2014-10-25 ENCOUNTER — Other Ambulatory Visit: Payer: Self-pay | Admitting: *Deleted

## 2014-10-25 MED ORDER — BUPROPION HCL ER (SR) 150 MG PO TB12: ORAL_TABLET | ORAL | Status: DC

## 2014-10-25 NOTE — Telephone Encounter (Signed)
Ok plus five monthly ref 

## 2014-11-12 ENCOUNTER — Other Ambulatory Visit: Payer: Self-pay | Admitting: Family Medicine

## 2014-12-10 ENCOUNTER — Telehealth: Payer: Self-pay | Admitting: *Deleted

## 2014-12-10 ENCOUNTER — Other Ambulatory Visit: Payer: Self-pay | Admitting: *Deleted

## 2014-12-10 ENCOUNTER — Telehealth: Payer: Self-pay | Admitting: Nurse Practitioner

## 2014-12-10 ENCOUNTER — Ambulatory Visit (HOSPITAL_BASED_OUTPATIENT_CLINIC_OR_DEPARTMENT_OTHER): Payer: BLUE CROSS/BLUE SHIELD | Admitting: Nurse Practitioner

## 2014-12-10 ENCOUNTER — Encounter: Payer: Self-pay | Admitting: Nurse Practitioner

## 2014-12-10 DIAGNOSIS — Z79811 Long term (current) use of aromatase inhibitors: Secondary | ICD-10-CM

## 2014-12-10 DIAGNOSIS — C50412 Malignant neoplasm of upper-outer quadrant of left female breast: Secondary | ICD-10-CM

## 2014-12-10 NOTE — Telephone Encounter (Signed)
Pt seeing CB/NP today per 06/06 POF... KJ

## 2014-12-10 NOTE — Progress Notes (Signed)
SYMPTOM MANAGEMENT CLINIC   HPI: [name] [name] 54 y.o. female diagnosed with breast cancer.  Currently undergoing anastrozole oral therapy.   Patient completed neoadjuvant chemotherapy in February 2015.  She underwent a left breast lumpectomy on 09/12/2013.  Patient completed radiation therapy in June 2015.  She completed maintenance Herceptin in October 2015.  Patient continues with anastrozole oral therapy as previously directed.  Patient reports palpating to separate masses to her left upper breast for the past 2-3 weeks.  She denies any erythema, edema, tenderness, or nipple changes to the site.  Patient denies any recent fevers or chills.  HPI  ROS  Past Medical History  Diagnosis Date  . Chest pain 2009    Consultation-[facility], negative chest CT; nl echo in 2005; h/o palpitations  . Palpitations   . Degenerative joint disease     + degenerative joint disease of the lumbosacral spine  . Colitis 2010    not IBD  . Herpes simplex type II infection   . Allergic rhinitis   . Hypercholesteremia     "slightly high"  . Anxiety   . Heart murmur     "small"  . GERD (gastroesophageal reflux disease)     "a little"  . Depression   . Anemia, unspecified 05/30/2013  . Wears glasses   . Breast cancer 03/31/13    left  . Cancer   . Dysrhythmia     palpations  . Hypertension     does not have high blood pressure  . Headache   . 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 Colonoscopy  10/2010    proctitis; melanosis coli  . Tubal ligation    . Breast excisional biopsy  04/2003    Left; benign disease  . Right oophorectomy  03/13/2007  . Abdominal hysterectomy  03/13/2007    TAH ?BSO--Dr Levin , [place]  . Eye surgery Left     "fix lazy eye"  . Portacath placement Right 04/21/2013    Procedure: INSERTION PORT-A-CATH;  Surgeon: [name], MD;  Location: [facility];  Service: General;  Laterality: Right;  . Breast lumpectomy with  needle localization and axillary lymph node dissection Left 09/12/2013    Procedure: BREAST LUMPECTOMY WITH NEEDLE LOCALIZATION AND AXILLARY LYMPH NODE DISSECTION;  Surgeon: [name], MD;  Location: [facility];  Service: General;  Laterality: Left;  and axilla  . Breast surgery    . Suboccipital craniectomy cervical laminectomy N/A 05/10/2014    Procedure: SUBOCCIPITAL CRANIECTOMY CERVICAL LAMINECTOMY/DURAPLASTY;  Surgeon: [name], MD;  Location: [facility];  Service: Neurosurgery;  Laterality: N/A;  suboccipital craniectomy with cervical laminectomy and duraplasty    has DEGENERATIVE JOINT DISEASE, RIGHT KNEE; DISC DEGENERATION; Chest pain; Palpitations; Heart murmur, systolic; Essential hypertension; Obesity (BMI 30-39.9); GERD (gastroesophageal reflux disease); Chronic depression; Rectovaginal fistula, proximal; Breast cancer of upper-outer quadrant of left female breast; Anxiety as acute reaction to exceptional stress; Frequent headaches; Joint pain; Neuropathic pain; Onychomycosis of toenail; Chiari I malformation; Vaginal dryness; Meningitis; Fever; Breast cancer; Depression; Anxiety; Heart murmur; Hypercholesteremia; and History of Chiari malformation on her problem list.    is allergic to doxycycline.    Medication List       This list is accurate as of: 12/10/14  3:49 PM.  Always use your most recent med list.               acetaminophen 500 MG tablet  Commonly known as:  TYLENOL  Take 500 mg by mouth every 6 (six) hours as needed for moderate pain.     ALPRAZolam 1 MG tablet  Commonly known as:  XANAX  TAKE 1 TABLET THREE TIMES A DAY AS NEEDED FOR ANXIETY     anastrozole 1 MG tablet  Commonly known as:  ARIMIDEX  Take 1 tablet (1 mg total) by mouth daily.     atenolol 50 MG tablet  Commonly known as:  TENORMIN  TAKE 1 TABLET BY MOUTH EVERY DAY     buPROPion 150 MG 12 hr tablet  Commonly known as:  WELLBUTRIN SR  one po bid     celecoxib  200 MG capsule  Commonly known as:  CELEBREX  Take 200 mg by mouth daily as needed for mild pain.     diphenhydramine-acetaminophen 25-500 MG Tabs  Commonly known as:  TYLENOL PM  Take 2 tablets by mouth at bedtime.     escitalopram 20 MG tablet  Commonly known as:  LEXAPRO  Take 1 tablet (20 mg total) by mouth daily.     pantoprazole 40 MG tablet  Commonly known as:  PROTONIX  Take 1 tablet (40 mg total) by mouth as needed.     pravastatin 20 MG tablet  Commonly known as:  PRAVACHOL  Take 1 tablet (20 mg total) by mouth daily.     valACYclovir 1000 MG tablet  Commonly known as:  VALTREX  Take 1,000 mg by mouth daily.     VITAMIN D PO  Take 1 tablet by mouth daily.         PHYSICAL EXAMINATION  Oncology Vitals 10/19/2014 10/16/2014 09/13/2014 09/07/2014 08/24/2014 08/24/2014 08/23/2014  Height 155 cm 155 cm - 155 cm - - 155 cm  Weight 75.978 kg 76.567 kg 77.111 kg 79.493 kg - - 78.74 kg  Weight (lbs) 167 lbs 8 oz 168 lbs 13 oz 170 lbs 175 lbs 4 oz - - 173 lbs 9 oz  BMI (kg/m2) 31.65 kg/m2 31.89 kg/m2 - 33.11 kg/m2 - - 32.8 kg/m2  Temp - 98.2 102.7 - 97.8 97.8 98  Pulse - 58 - - 70 63 70  Resp - 18 - - _0 SpO2 - - - - 100 100 98  BSA (m2) 1.81 m2 1.82 m2 - 1.85 m2 - - 1.84 m2   BP Readings from Last 3 Encounters:  10/19/14 124/80  10/16/14 114/57  09/13/14 132/88    Physical Exam  Constitutional: She is oriented to person, place, and time and well-developed, well-nourished, and in no distress.  HENT:  Head: Normocephalic and atraumatic.  Eyes: Conjunctivae and EOM are normal. Pupils are equal, round, and reactive to light. Right eye exhibits no discharge. Left eye exhibits no discharge. No scleral icterus.  Neck: Normal range of motion.  Pulmonary/Chest: Effort normal. No respiratory distress.  Patient completed neoadjuvant chemotherapy in February 2015.  She underwent a left breast lumpectomy on 09/12/2013.  Patient completed radiation therapy in June 2015.  She  completed maintenance Herceptin in October 2015. On exam.-Was able to palpate a small nodule at approximately the 11:00 region of the left upper breast; as well as a larger mass directly below the smaller mass.    Musculoskeletal: Normal range of motion. She exhibits no edema or tenderness.  Neurological: She is alert and oriented to person, place, and time. Gait normal.  Skin: Skin is warm and dry. No rash noted. No erythema. No pallor.  Psychiatric: Affect normal.  Nursing note  and vitals reviewed.   LABORATORY DATA:. No visits with results within 3 Day(s) from this visit. Latest known visit with results is:  Appointment on 10/16/2014  Component Date Value Ref Range Status  . WBC 10/16/2014 6.1  3.9 - 10.3 10e3/uL Final  . NEUT# 10/16/2014 4.1  1.5 - 6.5 10e3/uL Final  . HGB 10/16/2014 12.4  11.6 - 15.9 g/dL Final  . HCT 10/16/2014 38.6  34.8 - 46.6 % Final  . Platelets 10/16/2014 186  145 - 400 10e3/uL Final  . MCV 10/16/2014 87.5  79.5 - 101.0 fL Final  . MCH 10/16/2014 28.2  25.1 - 34.0 pg Final  . MCHC 10/16/2014 32.2  31.5 - 36.0 g/dL Final  . RBC 10/16/2014 4.42  3.70 - 5.45 10e6/uL Final  . RDW 10/16/2014 14.8* 11.2 - 14.5 % Final  . lymph# 10/16/2014 1.3  0.9 - 3.3 10e3/uL Final  . MONO# 10/16/2014 0.5  0.1 - 0.9 10e3/uL Final  . Eosinophils Absolute 10/16/2014 0.1  0.0 - 0.5 10e3/uL Final  . Basophils Absolute 10/16/2014 0.0  0.0 - 0.1 10e3/uL Final  . NEUT% 10/16/2014 67.6  38.4 - 76.8 % Final  . LYMPH% 10/16/2014 22.0  14.0 - 49.7 % Final  . MONO% 10/16/2014 8.3  0.0 - 14.0 % Final  . EOS% 10/16/2014 1.4  0.0 - 7.0 % Final  . BASO% 10/16/2014 0.7  0.0 - 2.0 % Final  . Sodium 10/16/2014 145  136 - 145 mEq/L Final  . Potassium 10/16/2014 3.7  3.5 - 5.1 mEq/L Final  . Chloride 10/16/2014 107  98 - 109 mEq/L Final  . CO2 10/16/2014 28  22 - 29 mEq/L Final  . Glucose 10/16/2014 86  70 - 140 mg/dl Final  . BUN 10/16/2014 10.3  7.0 - 26.0 mg/dL Final  . Creatinine  10/16/2014 0.9  0.6 - 1.1 mg/dL Final  . Total Bilirubin 10/16/2014 0.39  0.20 - 1.20 mg/dL Final  . Alkaline Phosphatase 10/16/2014 53  40 - 150 U/L Final  . AST 10/16/2014 12  5 - 34 U/L Final  . ALT 10/16/2014 14  0 - 55 U/L Final  . Total Protein 10/16/2014 7.1  6.4 - 8.3 g/dL Final  . Albumin 10/16/2014 3.8  3.5 - 5.0 g/dL Final  . Calcium 10/16/2014 9.5  8.4 - 10.4 mg/dL Final  . Anion Gap 10/16/2014 9  3 - 11 mEq/L Final  . EGFR 10/16/2014 87* >90 ml/min/1.73 m2 Final   eGFR is calculated using the CKD-EPI Creatinine Equation (2009)     RADIOGRAPHIC STUDIES: No results found.  ASSESSMENT/PLAN:    Breast cancer of upper-outer quadrant of left female breast Patient completed neoadjuvant chemotherapy in February 2015.  She underwent a left breast lumpectomy on 09/12/2013.  Patient completed radiation therapy in June 2015.  She completed maintenance Herceptin in October 2015.  Patient reports palpating to separate masses to her left upper breast for the past 2-3 weeks.  She denies any erythema, edema, tenderness, or nipple changes to the site.  On exam.-Was able to palpate a small nodule at approximately the 11:00 region of the left upper breast; as well as a larger mass directly below the smaller mass.  Also, patient has a known right axilla mass that is most likely a probable benign area of scar tissue from her previous Port-A-Cath-per most recent scan results.  Patient is due for a right breast.  Repeat diagnostic mammogram of her right breast for further follow-up of the right axilla  nodule.  Will obtain a left breast diagnostic mammogram for as soon as possible; and see if we can go ahead and do the right breast mammogram at the same time.  Is scheduled to return on 05/06/2015 for labs and a follow-up visit.   Patient stated understanding of all instructions; and was in agreement with this plan of care. The patient knows to call the clinic with any problems, questions or  concerns.   Review/collaboration with Dr. Jana Hakim regarding all aspects of patient's visit today.   Total time spent with patient was 25 minutes;  with greater than 75 percent of that time spent in face to face counseling regarding patient's symptoms,  and coordination of care and follow up.  Disclaimer: This note was dictated with voice recognition software. Similar sounding words can inadvertently be transcribed and may not be corrected upon review.   Drue Second, NP 12/10/2014

## 2014-12-10 NOTE — Telephone Encounter (Signed)
LEFT MESSAGE ON PT.'S HOME VOICE MAIL. NEED MORE INFORMATION CONCERNING NEW LUMP. THIS INFORMATION WILL BE PLACED ON ORDER FOR PT.'S MAMMOGRAM.

## 2014-12-10 NOTE — Assessment & Plan Note (Addendum)
Patient completed neoadjuvant chemotherapy in February 2015.  She underwent a left breast lumpectomy on 09/12/2013.  Patient completed radiation therapy in June 2015.  She completed maintenance Herceptin in October 2015.  Patient continues with anastrozole oral therapy as previously directed.  Patient reports palpating to separate masses to her left upper breast for the past 2-3 weeks.  She denies any erythema, edema, tenderness, or nipple changes to the site.  On exam.-Was able to palpate a small nodule at approximately the 11:00 region of the left upper breast; as well as a larger mass directly below the smaller mass.  Also, patient has a known right axilla mass that is most likely a probable benign area of scar tissue from her previous Port-A-Cath-per most recent scan results.  Patient is due for a right breast.  Repeat diagnostic mammogram of her right breast for further follow-up of the right axilla nodule.  Will obtain a left breast diagnostic mammogram for as soon as possible; and see if we can go ahead and do the right breast mammogram at the same time.  Is scheduled to return on 05/06/2015 for labs and a follow-up visit.

## 2014-12-11 ENCOUNTER — Other Ambulatory Visit: Payer: Self-pay | Admitting: *Deleted

## 2014-12-11 ENCOUNTER — Other Ambulatory Visit: Payer: Self-pay | Admitting: Nurse Practitioner

## 2014-12-11 ENCOUNTER — Telehealth: Payer: Self-pay

## 2014-12-11 ENCOUNTER — Telehealth: Payer: Self-pay | Admitting: Oncology

## 2014-12-11 ENCOUNTER — Telehealth: Payer: Self-pay | Admitting: *Deleted

## 2014-12-11 DIAGNOSIS — C50412 Malignant neoplasm of upper-outer quadrant of left female breast: Secondary | ICD-10-CM

## 2014-12-11 DIAGNOSIS — C50919 Malignant neoplasm of unspecified site of unspecified female breast: Secondary | ICD-10-CM

## 2014-12-11 NOTE — Telephone Encounter (Signed)
Called to follow up with pt after [facility] visit yesterday 12/11/14. Pt states she feels okay today. Informed pt we are working on getting her mammogram scheduled and would call her with the appt time. Pt verbalized understanding and denies any further questions or concerns at this time.

## 2014-12-11 NOTE — Telephone Encounter (Signed)
INFORMED PT. THAT THE ORDER FOR HER MAMMOGRAM IS IN THE SYSTEM. WHEN IT IS SCHEDULED PT. WILL BE CALLED WITH THE DATE AND TIME. SHE VOICES UNDERSTANDING.

## 2014-12-11 NOTE — Telephone Encounter (Signed)
per pof to sch mamma & per new order per Val US-sch and cld & spoke to pt and gave pt time/date/location of mamma-pt understood

## 2014-12-12 ENCOUNTER — Other Ambulatory Visit: Payer: Self-pay | Admitting: Nurse Practitioner

## 2014-12-12 DIAGNOSIS — C50412 Malignant neoplasm of upper-outer quadrant of left female breast: Secondary | ICD-10-CM

## 2014-12-12 DIAGNOSIS — C50919 Malignant neoplasm of unspecified site of unspecified female breast: Secondary | ICD-10-CM

## 2014-12-13 ENCOUNTER — Other Ambulatory Visit: Payer: Self-pay | Admitting: Nurse Practitioner

## 2014-12-13 ENCOUNTER — Ambulatory Visit
Admission: RE | Admit: 2014-12-13 | Discharge: 2014-12-13 | Disposition: A | Discharge status: Home / Self Care | Payer: BLUE CROSS/BLUE SHIELD | Source: Ambulatory Visit | Attending: Nurse Practitioner | Admitting: Nurse Practitioner

## 2014-12-13 ENCOUNTER — Ambulatory Visit
Admission: RE | Admit: 2014-12-13 | Discharge: 2014-12-13 | Disposition: A | Discharge status: Home / Self Care | Payer: BLUE CROSS/BLUE SHIELD | Source: Ambulatory Visit | Attending: Oncology | Admitting: Oncology

## 2014-12-13 DIAGNOSIS — C50412 Malignant neoplasm of upper-outer quadrant of left female breast: Secondary | ICD-10-CM

## 2014-12-13 DIAGNOSIS — C50919 Malignant neoplasm of unspecified site of unspecified female breast: Secondary | ICD-10-CM

## 2015-01-02 ENCOUNTER — Other Ambulatory Visit: Payer: Self-pay | Admitting: Family Medicine

## 2015-01-03 DIAGNOSIS — Z029 Encounter for administrative examinations, unspecified: Secondary | ICD-10-CM

## 2015-01-11 ENCOUNTER — Telehealth: Payer: Self-pay | Admitting: Family Medicine

## 2015-01-11 DIAGNOSIS — E785 Hyperlipidemia, unspecified: Secondary | ICD-10-CM

## 2015-01-11 DIAGNOSIS — Z79899 Other long term (current) drug therapy: Secondary | ICD-10-CM

## 2015-01-11 NOTE — Telephone Encounter (Signed)
Patient had Lipid, liver, met 7 amd cbc 10/2014

## 2015-01-11 NOTE — Telephone Encounter (Signed)
Pt called requesting lab orders to be sent over for her upcoming follow up appt.

## 2015-01-14 NOTE — Telephone Encounter (Signed)
bw orders put in. Pt notified on voicemail.

## 2015-01-14 NOTE — Telephone Encounter (Signed)
Lip liv 

## 2015-01-16 ENCOUNTER — Encounter: Payer: Self-pay | Admitting: Family Medicine

## 2015-01-16 ENCOUNTER — Ambulatory Visit (INDEPENDENT_AMBULATORY_CARE_PROVIDER_SITE_OTHER): Payer: BLUE CROSS/BLUE SHIELD | Admitting: Family Medicine

## 2015-01-16 VITALS — BP 100/80 | Ht 61.0 in | Wt 161.5 lb

## 2015-01-16 DIAGNOSIS — E785 Hyperlipidemia, unspecified: Secondary | ICD-10-CM

## 2015-01-16 LAB — LIPID PANEL
Chol/HDL Ratio: 3.2 ratio units (ref 0.0–4.4)
Cholesterol, Total: 257 mg/dL — ABNORMAL HIGH (ref 100–199)
HDL: 81 mg/dL (ref >39)
LDL Calculated: 156 mg/dL — ABNORMAL HIGH (ref 0–99)
Triglycerides: 102 mg/dL (ref 0–149)
VLDL Cholesterol Cal: 20 mg/dL (ref 5–40)

## 2015-01-16 LAB — HEPATIC FUNCTION PANEL
ALT: 16 IU/L (ref 0–32)
AST: 13 IU/L (ref 0–40)
Albumin: 4.8 g/dL (ref 3.5–5.5)
Alkaline Phosphatase: 64 IU/L (ref 39–117)
Bilirubin Total: 0.3 mg/dL (ref 0.0–1.2)
Bilirubin, Direct: 0.05 mg/dL (ref 0.00–0.40)
Total Protein: 7.5 g/dL (ref 6.0–8.5)

## 2015-01-16 NOTE — Progress Notes (Signed)
   Subjective:    Patient ID: [name] [name], female    DOB: [birth year], 54 y.o.   MRN: [id number]  Hyperlipidemia This is a chronic problem. The current episode started more than 1 month ago. There are no known factors aggravating her hyperlipidemia. Current antihyperlipidemic treatment includes statins. The current treatment provides moderate improvement of lipids. There are no compliance problems.  There are no known risk factors for coronary artery disease.   Patient has no concerns at this time.   Results for orders placed or performed in visit on 10/16/14  CBC with Differential  Result Value Ref Range   WBC 6.1 3.9 - 10.3 10e3/uL   NEUT# 4.1 1.5 - 6.5 10e3/uL   HGB 12.4 11.6 - 15.9 g/dL   HCT 38.6 34.8 - 46.6 %   Platelets 186 145 - 400 10e3/uL   MCV 87.5 79.5 - 101.0 fL   MCH 28.2 25.1 - 34.0 pg   MCHC 32.2 31.5 - 36.0 g/dL   RBC 4.42 3.70 - 5.45 10e6/uL   RDW 14.8 (H) 11.2 - 14.5 %   lymph# 1.3 0.9 - 3.3 10e3/uL   MONO# 0.5 0.1 - 0.9 10e3/uL   Eosinophils Absolute 0.1 0.0 - 0.5 10e3/uL   Basophils Absolute 0.0 0.0 - 0.1 10e3/uL   NEUT% 67.6 38.4 - 76.8 %   LYMPH% 22.0 14.0 - 49.7 %   MONO% 8.3 0.0 - 14.0 %   EOS% 1.4 0.0 - 7.0 %   BASO% 0.7 0.0 - 2.0 %  Comprehensive metabolic panel (Cmet) - [facility]  Result Value Ref Range   Sodium 145 136 - 145 mEq/L   Potassium 3.7 3.5 - 5.1 mEq/L   Chloride 107 98 - 109 mEq/L   CO2 28 22 - 29 mEq/L   Glucose 86 70 - 140 mg/dl   BUN 10.3 7.0 - 26.0 mg/dL   Creatinine 0.9 0.6 - 1.1 mg/dL   Total Bilirubin 0.39 0.20 - 1.20 mg/dL   Alkaline Phosphatase 53 40 - 150 U/L   AST 12 5 - 34 U/L   ALT 14 0 - 55 U/L   Total Protein 7.1 6.4 - 8.3 g/dL   Albumin 3.8 3.5 - 5.0 g/dL   Calcium 9.5 8.4 - 10.4 mg/dL   Anion Gap 9 3 - 11 mEq/L   EGFR 87 (L) >90 ml/min/1.73 m2    Review of Systems No headach no cp no abd pain    Objective:   Physical Exam  vss nad, l malaise no headache erl no headacheino headache no chest pain no back pain  no abd   ungs cleaf hdeart rrr ankles no edema      Assessment & Plan:   hyperlipidemia, bw pending, diet ex disc, further rec based on bw

## 2015-01-18 MED ORDER — PRAVASTATIN SODIUM 40 MG PO TABS: 40.0000 mg | ORAL_TABLET | Freq: Every day | ORAL | Status: DC

## 2015-01-18 NOTE — Addendum Note (Signed)
Addended by: Ofilia Neas R on: 01/18/2015 11:30 AM   Modules accepted: Orders

## 2015-01-25 ENCOUNTER — Other Ambulatory Visit: Payer: Self-pay | Admitting: Oncology

## 2015-02-04 ENCOUNTER — Other Ambulatory Visit: Payer: Self-pay | Admitting: Family Medicine

## 2015-02-16 IMAGING — US US LT BREAST BX W LOC DEV 1ST LESION IMG BX SPEC US GUIDE
1 series · 13 of 14 positions shown · non-contrast
Comparison: Previous exams.

***ADDENDUM*** CREATED: 04/03/2013 [DATE]

I spoke with the patient by telephone on 03/31/2013 to discuss
pathology results.  Pathology demonstrates  invasive ductal
carcinoma and metastatic left axillary lymphadenopathy.  These
results were also discussed with the referring physician's office.
The patient reports no problems at the biopsy site.  All questions
were answered.
Recommendations:
Surgical consultation is recommended and has been scheduled with
the [facility] on 04/12/2013.  Additionally the patient will be scheduled
for a breast MRI and contacted with an appointment.
***END ADDENDUM*** SIGNED BY: WALLACH, CATARINA
CLINICAL DATA: Patient with concerning left breast mass and
enlarged left axillary lymph node.
ULTRASOUND GUIDED VACUUM ASSISTED CORE BIOPSY OF THE LEFT BREAST
AND LEFT AXILLARY LYMPH NODE

[Series 1: us left breast bx w loc dev 1st lesion img bx spec · 0.10mm/px · 13 of 14 slices shown]
[im 1/14]
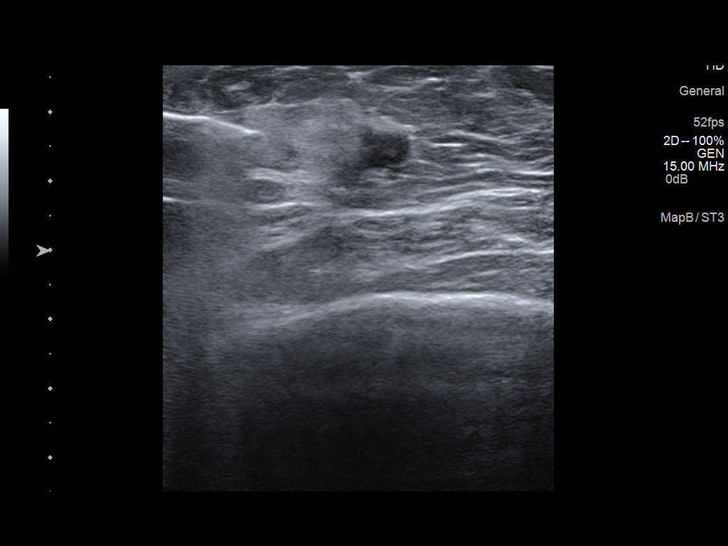
[im 2/14]
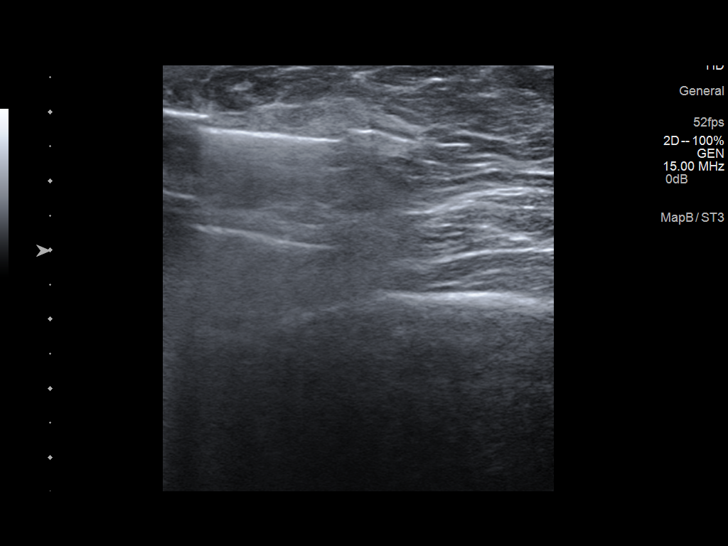
[im 3/14]
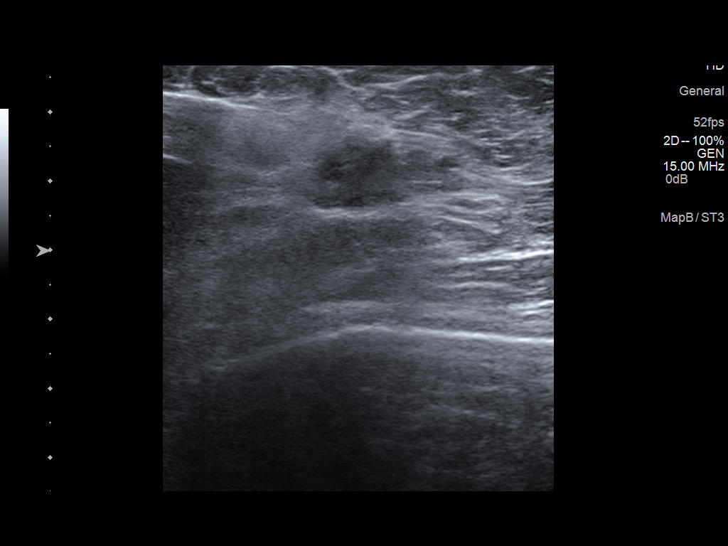
[im 4/14]
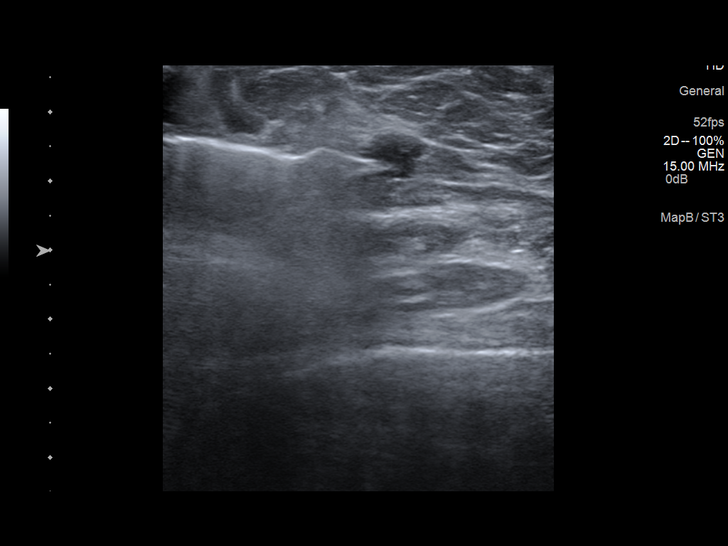
[im 5/14]
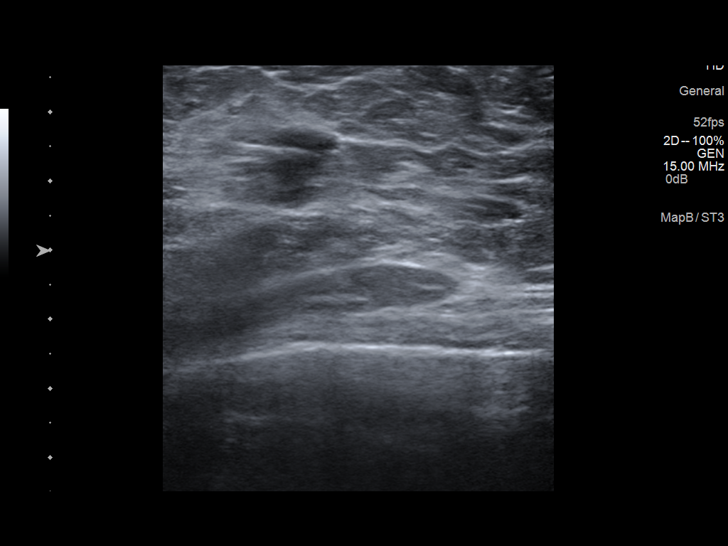
[im 6/14]
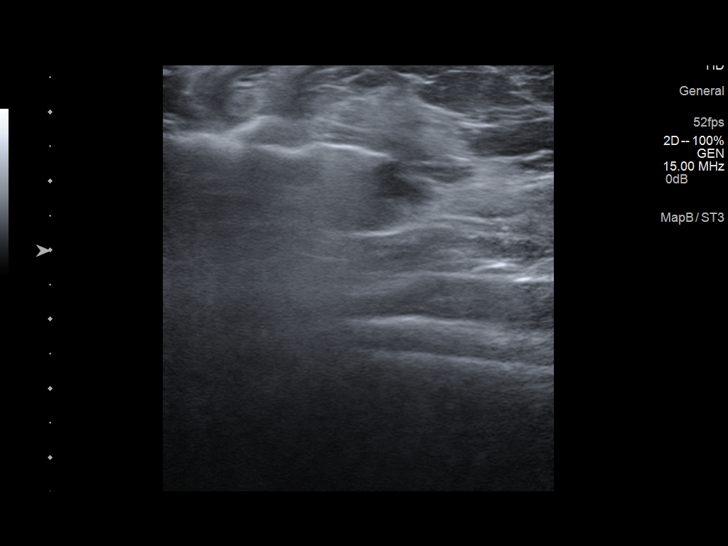
[im 8/14]
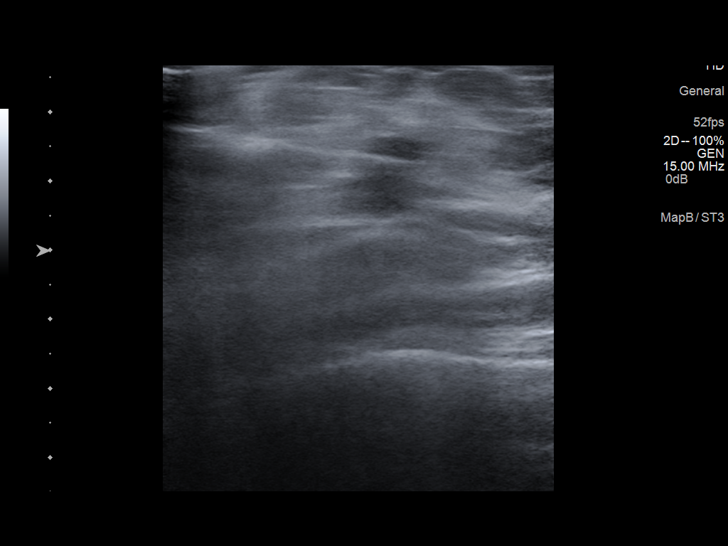
[im 9/14]
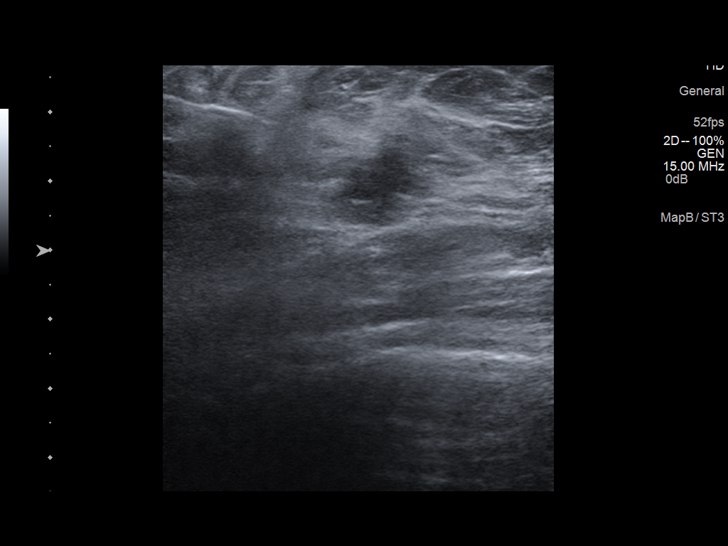
[im 10/14]
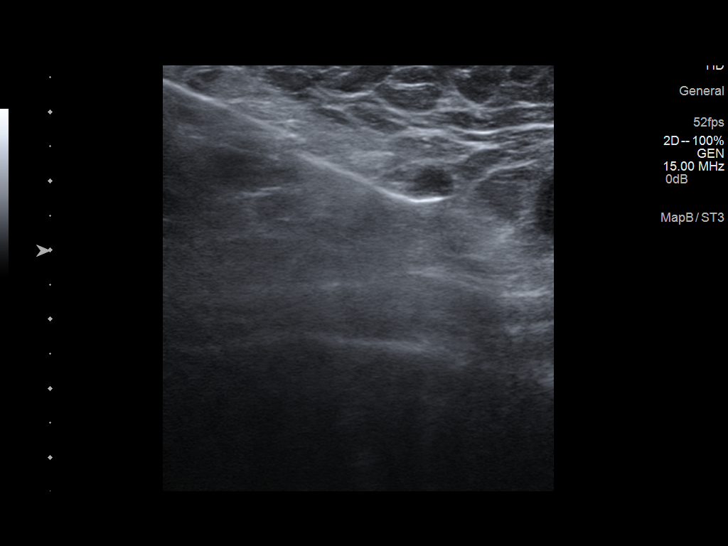
[im 11/14]
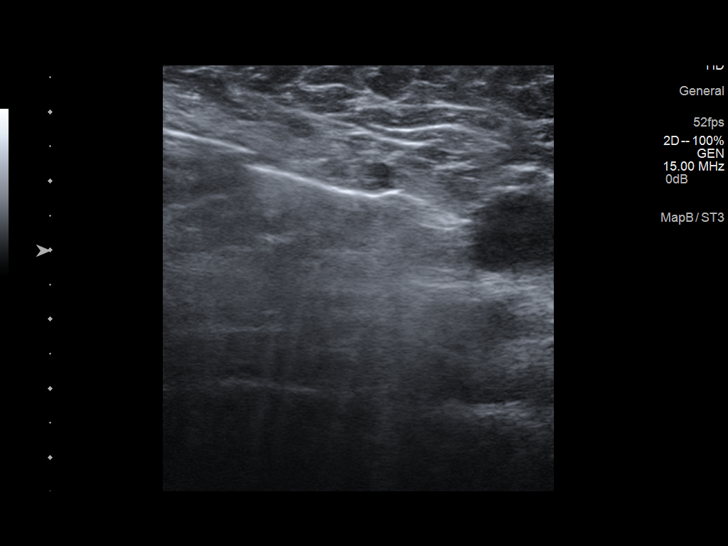
[im 12/14]
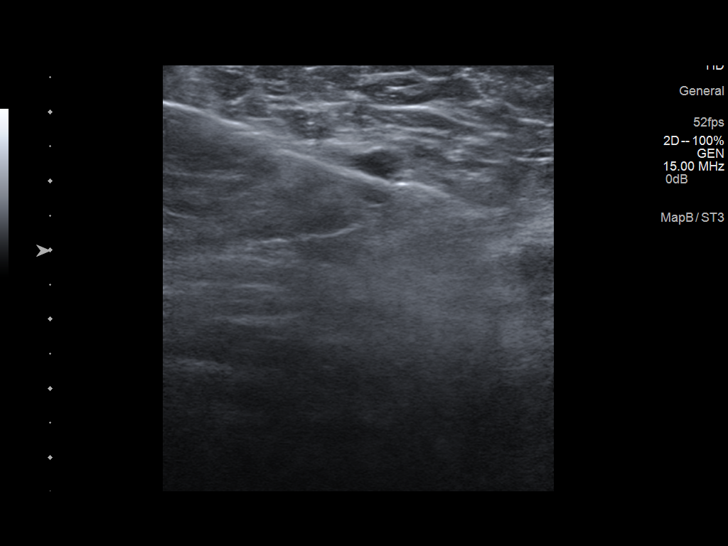
[im 13/14]
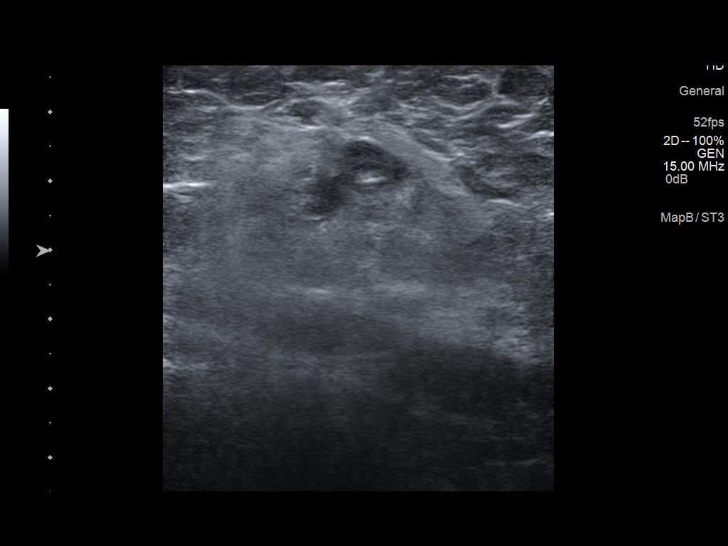
[im 14/14]
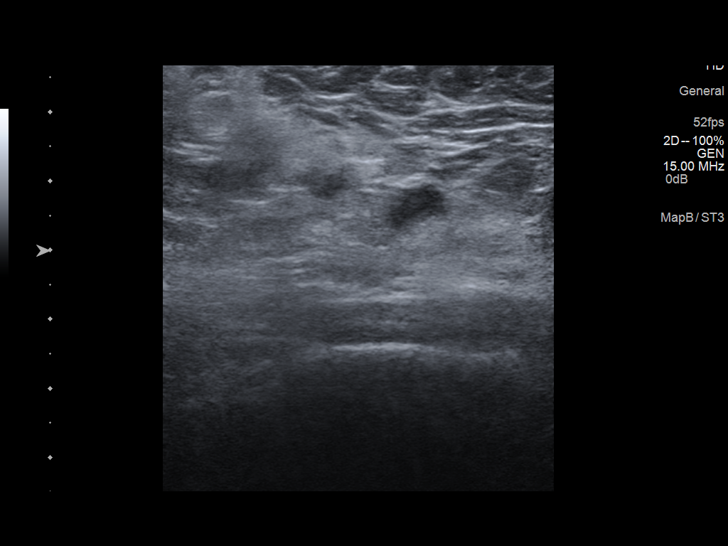

[13 of 14 positions shown; findings below may reference images not displayed]

I met with the patient and we discussed the procedure of ultrasound-
guided biopsy, including benefits and alternatives.  We discussed
the high likelihood of a successful procedure. We discussed the
risks of the procedure including infection, bleeding, tissue
injury, clip migration, and inadequate sampling.  Informed written
consent was given. The usual time-out protocol was performed
immediately prior to the procedure.

Using sterile technique and 2% Lidocaine as local anesthetic, under
direct ultrasound visualization, a 12 gauge vacuum-assisted device
was used to perform biopsy of irregular left breast mass using a
medial approach. At the conclusion of the procedure, a wing shaped
tissue marker clip was deployed into the biopsy cavity and
determined to be in appropriate position by post procedure
mammogram.

Using sterile technique and 2% Lidocaine as local anesthetic, under
direct ultrasound visualization, a {12} gauge {vacuum-assisted}
device was used to perform biopsy of enlarged left axillary lymph
node using a medial approach. At the conclusion of the procedure, a
ribbion shaped tissue marker clip was deployed into the biopsy
cavity and determined to be in appropriate position by post
procedure mammogram.
IMPRESSION: Ultrasound-guided biopsy of left breast mass and left axillary
lymph node.  No apparent complications.

## 2015-03-05 ENCOUNTER — Other Ambulatory Visit: Payer: Self-pay

## 2015-03-05 MED ORDER — PRAVASTATIN SODIUM 40 MG PO TABS: 40.0000 mg | ORAL_TABLET | Freq: Every day | ORAL | Status: DC

## 2015-03-24 ENCOUNTER — Other Ambulatory Visit: Payer: Self-pay | Admitting: Family Medicine

## 2015-04-03 ENCOUNTER — Telehealth: Payer: Self-pay

## 2015-04-03 ENCOUNTER — Telehealth: Payer: Self-pay | Admitting: Nurse Practitioner

## 2015-04-03 NOTE — Telephone Encounter (Signed)
Pt LMOVM wanting earlier appt due/to "more problems with breast".  Called pt - reports left breat pain and swelling.  Pain is over past 2 weeks - intermittent, very tender, close to lumpectomy scar.  Pt reports swelling is mild, "enough that she noticed".  Pt reports back pain over past 3 wks - months.  Pain is intermittent, has hurt all day today.  It is a dull ache in the middle of her back and radiates up.  Lying down sometimes provides relief.  Pt has tried tylenol with no relief.  Appt made with [facility] NP for 930 am 9/29.  Pt voiced understanding.  POF sent.  Routed to [employer].

## 2015-04-03 NOTE — Telephone Encounter (Signed)
Per pof patient is aware of 9/29 appointments

## 2015-04-04 ENCOUNTER — Other Ambulatory Visit: Payer: Self-pay | Admitting: Nurse Practitioner

## 2015-04-04 ENCOUNTER — Ambulatory Visit (HOSPITAL_BASED_OUTPATIENT_CLINIC_OR_DEPARTMENT_OTHER): Payer: BLUE CROSS/BLUE SHIELD | Admitting: Nurse Practitioner

## 2015-04-04 DIAGNOSIS — Z79811 Long term (current) use of aromatase inhibitors: Secondary | ICD-10-CM

## 2015-04-04 DIAGNOSIS — C50412 Malignant neoplasm of upper-outer quadrant of left female breast: Secondary | ICD-10-CM | POA: Diagnosis not present

## 2015-04-05 ENCOUNTER — Other Ambulatory Visit: Payer: Self-pay

## 2015-04-05 ENCOUNTER — Other Ambulatory Visit: Payer: Self-pay | Admitting: Nurse Practitioner

## 2015-04-05 DIAGNOSIS — C50412 Malignant neoplasm of upper-outer quadrant of left female breast: Secondary | ICD-10-CM

## 2015-04-06 ENCOUNTER — Other Ambulatory Visit: Payer: Self-pay | Admitting: Family Medicine

## 2015-04-07 ENCOUNTER — Encounter: Payer: Self-pay | Admitting: Nurse Practitioner

## 2015-04-07 NOTE — Assessment & Plan Note (Signed)
Patient completed neoadjuvant chemotherapy in February 2015.  She underwent a left breast lumpectomy on 09/12/2013.  Patient completed radiation therapy in June 2015.  She completed maintenance Herceptin in October 2015.  Patient continues with anastrozole oral therapy as previously directed.  Patient reports palpating mass to her left upper breast for the past 1 month. She reports that the pain occasionally radiates to her back. She denies any chest pain, chest pressure, SOB, or pain with inspiration.  She denies any erythema, edema, tenderness, or nipple changes to the site.  She has also noticed a questionable mass to her right axilla region.   On exam.-Was able to palpate a small nodule at approximately the 2:00 region of the left upper breast;as well as questionable mass to right axilla.    Pt reported similar symptoms in June 2016; and diagnostic mammo and US revealed no acute findings at that time.   Patient has a known right axilla mass that is most likely a probable benign area of scar tissue from her previous Port-A-Cath-per most recent scan results.  Pt will be scheduled for a diagnostic mammo and [place] for further eval.   Is scheduled to return on 05/06/2015 for labs and a follow-up visit.

## 2015-04-07 NOTE — Progress Notes (Signed)
SYMPTOM MANAGEMENT CLINIC   HPI: [name] [name] 54 y.o. female diagnosed with breast cancer.  Currently undergoing anastrozole oral therapy.   Patient completed neoadjuvant chemotherapy in February 2015.  She underwent a left breast lumpectomy on 09/12/2013.  Patient completed radiation therapy in June 2015.  She completed maintenance Herceptin in October 2015.  Patient continues with anastrozole oral therapy as previously directed.  Patient completed neoadjuvant chemotherapy in February 2015.  She underwent a left breast lumpectomy on 09/12/2013.  Patient completed radiation therapy in June 2015.  She completed maintenance Herceptin in October 2015.  Patient continues with anastrozole oral therapy as previously directed.  Patient reports palpating mass to her left upper breast for the past 1 month. She reports that the pain occasionally radiates to her back. She denies any chest pain, chest pressure, SOB, or pain with inspiration.  She denies any erythema, edema, tenderness, or nipple changes to the site.  She has also noticed a questionable mass to her right axilla region.   On exam.-Was able to palpate a small nodule at approximately the 2:00 region of the left upper breast;as well as questionable mass to right axilla.    Pt reported similar symptoms in June 2016; and diagnostic mammo and US revealed no acute findings at that time.   Patient has a known right axilla mass that is most likely a probable benign area of scar tissue from her previous Port-A-Cath-per most recent scan results.  Pt will be scheduled for a diagnostic mammo and [place] for further eval.   Is scheduled to return on 05/06/2015 for labs and a follow-up visit.   Patient denies any recent fevers or chills.  HPI  ROS  Past Medical History  Diagnosis Date  . Chest pain 2009    Consultation-[facility], negative chest CT; nl echo in 2005; h/o palpitations  . Palpitations   . Degenerative joint disease     +  degenerative joint disease of the lumbosacral spine  . Colitis 2010    not IBD  . Herpes simplex type II infection   . Allergic rhinitis   . Hypercholesteremia     "slightly high"  . Anxiety   . Heart murmur     "small"  . GERD (gastroesophageal reflux disease)     "a little"  . Depression   . Anemia, unspecified 05/30/2013  . Wears glasses   . Breast cancer ([place]) 03/31/13    left  . Cancer ([place])   . Dysrhythmia     palpations  . Hypertension     does not have high blood pressure  . Headache   . 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 Colonoscopy  10/2010    proctitis; melanosis coli  . Tubal ligation    . Breast excisional biopsy  04/2003    Left; benign disease  . Right oophorectomy  03/13/2007  . Abdominal hysterectomy  03/13/2007    TAH ?BSO--Dr Levin Bacon, [place]  . Eye surgery Left     "fix lazy eye"  . Portacath placement Right 04/21/2013    Procedure: INSERTION PORT-A-CATH;  Surgeon: [name], MD;  Location: [facility];  Service: General;  Laterality: Right;  . Breast lumpectomy with needle localization and axillary lymph node dissection Left 09/12/2013    Procedure: BREAST LUMPECTOMY WITH NEEDLE LOCALIZATION AND AXILLARY LYMPH NODE DISSECTION;  Surgeon: [name], MD;  Location: [facility];  Service: General;  Laterality: Left;  and axilla  .  Breast surgery    . Suboccipital craniectomy cervical laminectomy N/A 05/10/2014    Procedure: SUBOCCIPITAL CRANIECTOMY CERVICAL LAMINECTOMY/DURAPLASTY;  Surgeon: [name], MD;  Location: [facility];  Service: Neurosurgery;  Laterality: N/A;  suboccipital craniectomy with cervical laminectomy and duraplasty    has DEGENERATIVE JOINT DISEASE, RIGHT KNEE; DISC DEGENERATION; Chest pain; Palpitations; Heart murmur, systolic; Essential hypertension; Obesity (BMI 30-39.9); GERD (gastroesophageal reflux disease); Chronic depression; Rectovaginal fistula, proximal;  Breast cancer of upper-outer quadrant of left female breast ([place]); Anxiety as acute reaction to exceptional stress; Frequent headaches; Joint pain; Neuropathic pain; Onychomycosis of toenail; Chiari I malformation ([place]); Vaginal dryness; Meningitis; Fever; Breast cancer ([place]); Depression; Anxiety; Heart murmur; Hypercholesteremia; and History of Chiari malformation on her problem list.    is allergic to doxycycline.    Medication List       This list is accurate as of: 04/04/15 11:59 PM.  Always use your most recent med list.               acetaminophen 500 MG tablet  Commonly known as:  TYLENOL  Take 500 mg by mouth every 6 (six) hours as needed for moderate pain.     ALPRAZolam 1 MG tablet  Commonly known as:  XANAX  TAKE 1 TABLET THREE TIMES A DAY AS NEEDED FOR ANXIETY     anastrozole 1 MG tablet  Commonly known as:  ARIMIDEX  TAKE 1 TABLET (1 MG TOTAL) BY MOUTH DAILY.     atenolol 50 MG tablet  Commonly known as:  TENORMIN  TAKE 1 TABLET BY MOUTH EVERY DAY     buPROPion 150 MG 12 hr tablet  Commonly known as:  WELLBUTRIN SR  one po bid     celecoxib 200 MG capsule  Commonly known as:  CELEBREX  Take 200 mg by mouth daily as needed for mild pain.     diphenhydramine-acetaminophen 25-500 MG Tabs tablet  Commonly known as:  TYLENOL PM  Take 2 tablets by mouth at bedtime.     escitalopram 20 MG tablet  Commonly known as:  LEXAPRO  TAKE 1 TABLET (20 MG TOTAL) BY MOUTH DAILY.     pantoprazole 40 MG tablet  Commonly known as:  PROTONIX  Take 1 tablet (40 mg total) by mouth as needed.     pravastatin 40 MG tablet  Commonly known as:  PRAVACHOL  Take 1 tablet (40 mg total) by mouth daily.     predniSONE 5 MG tablet  Commonly known as:  DELTASONE     valACYclovir 1000 MG tablet  Commonly known as:  VALTREX  Take 1,000 mg by mouth daily.     VITAMIN D PO  Take 1 tablet by mouth daily.         PHYSICAL EXAMINATION  Oncology Vitals 04/04/2015 01/16/2015  10/19/2014 10/16/2014 09/13/2014 09/07/2014 08/24/2014  Height 155 cm 155 cm 155 cm 155 cm - 155 cm -  Weight 74.072 kg 73.256 kg 75.978 kg 76.567 kg 77.111 kg 79.493 kg -  Weight (lbs) 163 lbs 5 oz 161 lbs 8 oz 167 lbs 8 oz 168 lbs 13 oz 170 lbs 175 lbs 4 oz -  BMI (kg/m2) 30.86 kg/m2 30.52 kg/m2 31.65 kg/m2 31.89 kg/m2 - 33.11 kg/m2 -  Temp 98.3 - - 98.2 102.7 - 97.8  Pulse 59 - - 58 - - 70  Resp 17 - - 18 - - 18  SpO2 100 - - - - - 100  BSA (m2) 1.79 m2 1.78  m2 1.81 m2 1.82 m2 - 1.85 m2 -   BP Readings from Last 3 Encounters:  04/04/15 127/69  01/16/15 100/80  10/19/14 124/80    Physical Exam  Constitutional: She is oriented to person, place, and time and well-developed, well-nourished, and in no distress.  HENT:  Head: Normocephalic and atraumatic.  Mouth/Throat: Oropharynx is clear and moist.  Eyes: Conjunctivae and EOM are normal. Pupils are equal, round, and reactive to light. Right eye exhibits no discharge. Left eye exhibits no discharge. No scleral icterus.  Neck: Normal range of motion. Neck supple. No JVD present. No tracheal deviation present. No thyromegaly present.  Cardiovascular: Normal rate, regular rhythm, normal heart sounds and intact distal pulses.   Pulmonary/Chest: Effort normal and breath sounds normal. No respiratory distress. She has no wheezes. She has no rales. She exhibits no tenderness.  On exam.-Was able to palpate a small nodule at approximately the 2:00 region of the left upper breast;as well as questionable mass to right axilla.         Abdominal: Soft. Bowel sounds are normal. She exhibits no distension and no mass. There is no tenderness. There is no rebound and no guarding.  Musculoskeletal: Normal range of motion. She exhibits no edema or tenderness.  Lymphadenopathy:    She has no cervical adenopathy.  Neurological: She is alert and oriented to person, place, and time. Gait normal.  Skin: Skin is warm and dry. No rash noted. No erythema. No  pallor.  Psychiatric: Affect normal.  Nursing note and vitals reviewed.   LABORATORY DATA:. No visits with results within 3 Day(s) from this visit. Latest known visit with results is:  Telephone on 01/11/2015  Component Date Value Ref Range Status  . Cholesterol, Total 01/15/2015 257* 100 - 199 mg/dL Final  . Triglycerides 01/15/2015 102  0 - 149 mg/dL Final  . HDL 01/15/2015 81  >39 mg/dL Final   Comment: According to ATP-III Guidelines, HDL-C >59 mg/dL is considered a negative risk factor for CHD.   Marland Kitchen VLDL Cholesterol Cal 01/15/2015 20  5 - 40 mg/dL Final  . LDL Calculated 01/15/2015 156* 0 - 99 mg/dL Final  . Chol/HDL Ratio 01/15/2015 3.2  0.0 - 4.4 ratio units Final   Comment:                                   T. Chol/HDL Ratio                                             Men  Women                               1/2 Avg.Risk  3.4    3.3                                   Avg.Risk  5.0    4.4                                2X Avg.Risk  9.6    7.1  3X Avg.Risk 23.4   11.0   . Total Protein 01/15/2015 7.5  6.0 - 8.5 g/dL Final  . Albumin 01/15/2015 4.8  3.5 - 5.5 g/dL Final  . Bilirubin Total 01/15/2015 0.3  0.0 - 1.2 mg/dL Final  . Bilirubin, Direct 01/15/2015 0.05  0.00 - 0.40 mg/dL Final  . Alkaline Phosphatase 01/15/2015 64  39 - 117 IU/L Final  . AST 01/15/2015 13  0 - 40 IU/L Final  . ALT 01/15/2015 16  0 - 32 IU/L Final     RADIOGRAPHIC STUDIES: No results found.  ASSESSMENT/PLAN:    Breast cancer of upper-outer quadrant of left female breast Patient completed neoadjuvant chemotherapy in February 2015.  She underwent a left breast lumpectomy on 09/12/2013.  Patient completed radiation therapy in June 2015.  She completed maintenance Herceptin in October 2015.  Patient continues with anastrozole oral therapy as previously directed.  Patient reports palpating mass to her left upper breast for the past 1 month. She reports that the pain  occasionally radiates to her back. She denies any chest pain, chest pressure, SOB, or pain with inspiration.  She denies any erythema, edema, tenderness, or nipple changes to the site.  She has also noticed a questionable mass to her right axilla region.   On exam.-Was able to palpate a small nodule at approximately the 2:00 region of the left upper breast;as well as questionable mass to right axilla.    Pt reported similar symptoms in June 2016; and diagnostic mammo and US revealed no acute findings at that time.   Patient has a known right axilla mass that is most likely a probable benign area of scar tissue from her previous Port-A-Cath-per most recent scan results.  Pt will be scheduled for a diagnostic mammo and [place] for further eval.   Is scheduled to return on 05/06/2015 for labs and a follow-up visit.     Patient stated understanding of all instructions; and was in agreement with this plan of care. The patient knows to call the clinic with any problems, questions or concerns.   Review/collaboration with Dr. Jana Hakim regarding all aspects of patient's visit today.   Total time spent with patient was 25 minutes;  with greater than 75 percent of that time spent in face to face counseling regarding patient's symptoms,  and coordination of care and follow up.  Disclaimer: This note was dictated with voice recognition software. Similar sounding words can inadvertently be transcribed and may not be corrected upon review.   Drue Second, NP 04/07/2015

## 2015-04-09 ENCOUNTER — Ambulatory Visit
Admission: RE | Admit: 2015-04-09 | Discharge: 2015-04-09 | Disposition: A | Discharge status: Home / Self Care | Payer: BLUE CROSS/BLUE SHIELD | Source: Ambulatory Visit | Attending: Nurse Practitioner | Admitting: Nurse Practitioner

## 2015-04-09 ENCOUNTER — Telehealth: Payer: Self-pay | Admitting: Nurse Practitioner

## 2015-04-09 DIAGNOSIS — C50412 Malignant neoplasm of upper-outer quadrant of left female breast: Secondary | ICD-10-CM

## 2015-04-09 NOTE — Telephone Encounter (Signed)
Patient underwent a bilateral diagnostic mammogram and ultrasound earlier today for concerns of mass to both the left breast in the right axilla region.  Mammogram and ultrasound were essentially normal; with the exception of a small postoperative seroma in the 1:00 position of the left breast only.  Called patient to review all results with her.  There was no answer; and brief message was left on patient's voicemail.  Advised patient via voicemail to call if she has any new worries or concerns.

## 2015-05-01 ENCOUNTER — Other Ambulatory Visit: Payer: Self-pay | Admitting: Family Medicine

## 2015-05-01 ENCOUNTER — Other Ambulatory Visit: Payer: Self-pay | Admitting: Nurse Practitioner

## 2015-05-03 ENCOUNTER — Other Ambulatory Visit: Payer: Self-pay | Admitting: Nurse Practitioner

## 2015-05-06 ENCOUNTER — Other Ambulatory Visit (HOSPITAL_BASED_OUTPATIENT_CLINIC_OR_DEPARTMENT_OTHER): Payer: BLUE CROSS/BLUE SHIELD

## 2015-05-06 ENCOUNTER — Telehealth: Payer: Self-pay | Admitting: Nurse Practitioner

## 2015-05-06 ENCOUNTER — Ambulatory Visit (HOSPITAL_BASED_OUTPATIENT_CLINIC_OR_DEPARTMENT_OTHER): Payer: BLUE CROSS/BLUE SHIELD | Admitting: Oncology

## 2015-05-06 VITALS — BP 118/57 | HR 60 | Temp 98.2°F | Resp 18 | Ht 61.0 in | Wt 166.2 lb

## 2015-05-06 DIAGNOSIS — Z79811 Long term (current) use of aromatase inhibitors: Secondary | ICD-10-CM | POA: Diagnosis not present

## 2015-05-06 DIAGNOSIS — C50412 Malignant neoplasm of upper-outer quadrant of left female breast: Secondary | ICD-10-CM | POA: Diagnosis not present

## 2015-05-06 LAB — CBC WITH DIFFERENTIAL/PLATELET
BASO%: 0.8 % (ref 0.0–2.0)
Basophils Absolute: 0 10*3/uL (ref 0.0–0.1)
EOS%: 0.9 % (ref 0.0–7.0)
Eosinophils Absolute: 0.1 10*3/uL (ref 0.0–0.5)
HCT: 37.4 % (ref 34.8–46.6)
HGB: 12.2 g/dL (ref 11.6–15.9)
LYMPH%: 20.5 % (ref 14.0–49.7)
MCH: 28.3 pg (ref 25.1–34.0)
MCHC: 32.6 g/dL (ref 31.5–36.0)
MCV: 86.8 fL (ref 79.5–101.0)
MONO#: 0.4 10*3/uL (ref 0.1–0.9)
MONO%: 5.7 % (ref 0.0–14.0)
NEUT#: 4.4 10*3/uL (ref 1.5–6.5)
NEUT%: 72.1 % (ref 38.4–76.8)
Platelets: 172 10*3/uL (ref 145–400)
RBC: 4.31 10*6/uL (ref 3.70–5.45)
RDW: 14.5 % (ref 11.2–14.5)
WBC: 6.2 10*3/uL (ref 3.9–10.3)
lymph#: 1.3 10*3/uL (ref 0.9–3.3)

## 2015-05-06 LAB — COMPREHENSIVE METABOLIC PANEL (CC13)
ALT: 18 U/L (ref 0–55)
AST: 13 U/L (ref 5–34)
Albumin: 3.8 g/dL (ref 3.5–5.0)
Alkaline Phosphatase: 60 U/L (ref 40–150)
Anion Gap: 6 mEq/L (ref 3–11)
BUN: 11.5 mg/dL (ref 7.0–26.0)
CO2: 30 mEq/L — ABNORMAL HIGH (ref 22–29)
Calcium: 9.7 mg/dL (ref 8.4–10.4)
Chloride: 108 mEq/L (ref 98–109)
Creatinine: 1 mg/dL (ref 0.6–1.1)
EGFR: 77 mL/min/{1.73_m2} — ABNORMAL LOW (ref >90)
Glucose: 94 mg/dl (ref 70–140)
Potassium: 3.6 mEq/L (ref 3.5–5.1)
Sodium: 144 mEq/L (ref 136–145)
Total Bilirubin: 0.34 mg/dL (ref 0.20–1.20)
Total Protein: 7.2 g/dL (ref 6.4–8.3)

## 2015-05-06 MED ORDER — ANASTROZOLE 1 MG PO TABS: 1.0000 mg | ORAL_TABLET | Freq: Every day | ORAL | Status: DC

## 2015-05-06 NOTE — Progress Notes (Signed)
ID: [name] [name] OB: [birth year]  MR#: [id number]  IWL#:[id number]  PCP: Mickie Hillier, MD ; Pearson Forster, NP GYN:   Josefa Half, MD  SU: Alphonsa Overall, MD Dooms:  Thea Silversmith, MD OTHER MD: Freida Busman, MD;  Neysa Bonito, MD, Sharman Crate MD  CHIEF COMPLAINT:  Left Breast Cancer CURRENT TREATMENT: Anastrozole daily  BREAST CANCER HISTORY: From the original intake note 04/12/2013:  [name] [name] palpated a mass in her left breast in late August 2014. She was already scheduled for screening mammography at [facility] and this was performed 03/13/2013. Indeed a possible mass was noted in the left breast and on 03/29/2013 the patient underwent diagnostic left mammography and left ultrasonography. This showed a 2.5 cm irregular mass in the upper outer quadrant of the left breast. This was palpable.by ultrasound there was a 1.6 cm irregular hypoechoic mass in the area in question, and in addition to enlarged left axillary lymph nodes were noted, the largest measuring 2.5 cm.  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  Bilateral breast MRI 04/11/2013 shows 3 abnormal enhancing masses in the upper outer quadrant of the left breast measuring altogether 6.4 cm. The largest separate mass measured 2.7 cm. There were abnormal or large lymph nodes in the left axilla. Ultrasound guided biopsy of the 2 smaller masses has been scheduled.  The patient's subsequent history is as detailed below   INTERVAL HISTORY: [name] [name] returns today for follow up of her left breast cancer. She continues on anastrozole, which she obtains for about $12 a month. She has arthralgias  and myalgias but these are not more intense or persistent than there were before she started anastrozole. Vaginal dryness is an significant issue however.  REVIEW OF SYSTEMS: [name] [name] continues to have significant headaches, which have been previously evaluated and are attributed to stress. Family issues continue, her daughter just had another baby and is living with the patient at home, is not employed. I believe this is her fourth baby. We discussed contraception options for her. [name] [name] herself of feels anxious and depressed. She has a history of irregular heartbeat and easy bruising which are however no worse or better than before. She is unemployed at present. All this contributes to her significant stress. A detailed review of systems today was otherwise stable.   PAST MEDICAL HISTORY: Past Medical History  Diagnosis Date  . Chest pain 2009    Consultation-[facility], negative chest CT; nl echo in 2005; h/o palpitations  . Palpitations   . Degenerative joint disease     + degenerative joint disease of the lumbosacral spine  . Colitis 2010    not IBD  . Herpes simplex type II infection   . Allergic rhinitis   . Hypercholesteremia     "slightly high"  . Anxiety   . Heart murmur     "small"  . GERD (gastroesophageal reflux disease)     "a little"  . Depression   . Anemia, unspecified 05/30/2013  . Wears glasses   . Breast cancer ([place]) 03/31/13    left  . Cancer ([place])   . Dysrhythmia     palpations  . Hypertension  does not have high blood pressure  . Headache   . Meningitis 08/22/2014    PAST SURGICAL HISTORY: Past Surgical History  Procedure Laterality Date  . Colonoscopy  10/2010    proctitis; melanosis coli  . Tubal ligation    . Breast excisional biopsy  04/2003    Left; benign disease  . Right oophorectomy  03/13/2007  . Abdominal hysterectomy  03/13/2007    TAH ?BSO--Dr Levin Bacon, [place]  . Eye surgery Left     "fix lazy eye"  . Portacath placement Right  04/21/2013    Procedure: INSERTION PORT-A-CATH;  Surgeon: [name], MD;  Location: [facility];  Service: General;  Laterality: Right;  . Breast lumpectomy with needle localization and axillary lymph node dissection Left 09/12/2013    Procedure: BREAST LUMPECTOMY WITH NEEDLE LOCALIZATION AND AXILLARY LYMPH NODE DISSECTION;  Surgeon: [name], MD;  Location: [place];  Service: General;  Laterality: Left;  and axilla  . Breast surgery    . Suboccipital craniectomy cervical laminectomy N/A 05/10/2014    Procedure: SUBOCCIPITAL CRANIECTOMY CERVICAL LAMINECTOMY/DURAPLASTY;  Surgeon: [name], MD;  Location: [place] [facility];  Service: Neurosurgery;  Laterality: N/A;  suboccipital craniectomy with cervical laminectomy and duraplasty    FAMILY HISTORY Family History  Problem Relation Age of Onset  . Aneurysm Mother     Cerebral  . Hypertension Mother   . Hyperlipidemia Mother   . Stroke Mother   . Coronary artery disease Father   . Hypertension Father   . Hyperlipidemia Father   . Lung cancer Brother   . Cancer Brother     Lung  The patient's father died at the age of 23 from a myocardial infarction. The patient's mother died at the age of 32 from a stroke. The patient had 6 brothers, 2 sisters. One brother has a history of lung cancer in the setting of tobacco abuse. Another brother had multiple myeloma. There is no history of breast or ovarian cancer in the family  GYNECOLOGIC HISTORY: (Updated 09/25/2013) Menarche age 1, first live birth age 84, the patient is [name] [name] P4. She status post abdominal hysterectomy with bilateral salpingo-oophorectomy. She never took hormone replacement.   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 Her husband [name] works in Charity fundraiser. Daughter [name] formerly lived at home with the patient with her 3 children, all under the age of 7; they have subsequently moved  out. She is not employed. Son Siri Buege lives in [place] and is not employed. Daughter Maggie Schwalbe Dillard lives in [place] and is a Barrister's clerk. Daughter Ernst Spell is a Education officer, museum in [place]. The patient has 10 grandchildren. She attends [employer]     ADVANCED DIRECTIVES: Not in place   HEALTH MAINTENANCE:  (Updated 09/25/2013)  Social History  Substance Use Topics  . Smoking status: Former Smoker -- 0.50 packs/day for 15 years    Types: Cigarettes    Quit date: 07/06/1982  . Smokeless tobacco: Never Used  . Alcohol Use: No     Colonoscopy:2012  PAP:2014   Bone density: Never  Lipid panel: Not on file    Allergies  Allergen Reactions  . Doxycycline Other (See Comments)    Unknown    Current Outpatient Prescriptions  Medication Sig Dispense Refill  . acetaminophen (TYLENOL) 500 MG tablet Take 500 mg by mouth every 6 (six) hours as  needed for moderate pain.     Marland Kitchen ALPRAZolam (XANAX) 1 MG tablet TAKE 1 TABLET THREE TIMES A DAY AS NEEDED FOR ANXIETY 60 tablet 5  . anastrozole (ARIMIDEX) 1 MG tablet TAKE 1 TABLET (1 MG TOTAL) BY MOUTH DAILY. 90 tablet 3  . atenolol (TENORMIN) 50 MG tablet TAKE 1 TABLET BY MOUTH EVERY DAY 90 tablet 1  . buPROPion (WELLBUTRIN SR) 150 MG 12 hr tablet one po bid 180 tablet 1  . celecoxib (CELEBREX) 200 MG capsule Take 200 mg by mouth daily as needed for mild pain.     . Cholecalciferol (VITAMIN D PO) Take 1 tablet by mouth daily.    . diphenhydramine-acetaminophen (TYLENOL PM) 25-500 MG TABS Take 2 tablets by mouth at bedtime.    Marland Kitchen escitalopram (LEXAPRO) 20 MG tablet TAKE 1 TABLET (20 MG TOTAL) BY MOUTH DAILY. 90 tablet 0  . pantoprazole (PROTONIX) 40 MG tablet Take 1 tablet (40 mg total) by mouth as needed.    . pantoprazole (PROTONIX) 40 MG tablet TAKE 1 TABLET (40 MG TOTAL) BY MOUTH DAILY. 90 tablet 1  . pravastatin (PRAVACHOL) 40 MG tablet Take 1 tablet (40 mg total) by mouth daily. 90 tablet 1   . predniSONE (DELTASONE) 5 MG tablet     . valACYclovir (VALTREX) 1000 MG tablet Take 1,000 mg by mouth daily.    . [DISCONTINUED] potassium chloride SA (K-DUR,KLOR-CON) 20 MEQ tablet Take 1 tablet (20 mEq total) by mouth 2 (two) times daily. 20 tablet 1   No current facility-administered medications for this visit.    OBJECTIVE: Middle-aged [place] American woman in no acute distress Filed Vitals:   05/06/15 1159  BP: 118/57  Pulse: 60  Temp: 98.2 F (36.8 C)  Resp: 18  Body mass index is 31.42 kg/(m^2).  ECOG: 1 Filed Weights   05/06/15 1159  Weight: 166 lb 3.2 oz (75.388 kg)   Sclerae unicteric, pupils round and equal Oropharynx clear and moist-- no thrush or other lesions No cervical or supraclavicular adenopathy Lungs no rales or rhonchi Heart regular rate and rhythm Abd soft, obese, nontender, positive bowel sounds MSK no focal spinal tenderness, no upper extremity lymphedema Neuro: nonfocal, well oriented, appropriate affect Breasts: The right breast is unremarkable. The left breast is status post lumpectomy and radiation. There is no evidence of local recurrence. Left axilla is benign.   LAB RESULTS:   Lab Results  Component Value Date   WBC 6.2 05/06/2015   NEUTROABS 4.4 05/06/2015   HGB 12.2 05/06/2015   HCT 37.4 05/06/2015   MCV 86.8 05/06/2015   PLT 172 05/06/2015      Chemistry      Component Value Date/Time   NA 145 10/16/2014 0814   NA 139 08/23/2014 0643   K 3.7 10/16/2014 0814   K 3.6 08/23/2014 0643   CL 105 08/23/2014 0643   CO2 28 10/16/2014 0814   CO2 28 08/23/2014 0643   BUN 10.3 10/16/2014 0814   BUN 10 08/23/2014 0643   CREATININE 0.9 10/16/2014 0814   CREATININE 0.96 08/23/2014 0643      Component Value Date/Time   CALCIUM 9.5 10/16/2014 0814   CALCIUM 8.8 08/23/2014 0643   ALKPHOS 64 01/15/2015 1114   ALKPHOS 53 10/16/2014 0814   AST 13 01/15/2015 1114   AST 12 10/16/2014 0814   ALT 16 01/15/2015 1114   ALT 14 10/16/2014  0814   BILITOT 0.3 01/15/2015 1114   BILITOT 0.39 10/16/2014 0814   BILITOT 0.7  08/22/2014 1138         STUDIES: US Breast Ltd Uni Left Inc Axilla  04/09/2015  CLINICAL DATA:  54 year old patient treated for left breast cancer in 2015. She presents with a palpable area of concern in the axillary tail of the right breast. She describes a feeling of tiny bumps. She also has an area of concern in the upper outer left breast near the lumpectomy scar. She reports some pain at the lumpectomy site. EXAM: DIGITAL DIAGNOSTIC BILATERAL MAMMOGRAM WITH 3D TOMOSYNTHESIS WITH CAD ULTRASOUND BILATERAL BREAST COMPARISON:  Previous exam(s). ACR Breast Density Category b: There are scattered areas of fibroglandular density. FINDINGS: Benign lumpectomy changes are noted in the deep upper outer left breast. Metallic skin markers are placed in the regions of patient concern. No mass, nonsurgical distortion, or suspicious microcalcification is identified in either breast. Spot tangential views of the regions of patient concern show scattered fibroglandular densities and no suspicious findings. Mammographic images were processed with CAD. On physical exam, there is a well-healed lumpectomy scar in the upper outer left breast. I do not palpate a mass in the upper-outer left breast or in the upper outer right breast in the regions of patient concern. Targeted ultrasound is performed, showing circumscribed benign seroma with simple anechoic fluid at 1 o'clock position 5 cm from the nipple in the left breast. This measures 3.0 cm x 0.6 cm x 1.9 cm. No solid or suspicious mass is identified in the upper outer left breast or the upper-outer right breast. IMPRESSION: No evidence of malignancy in either breast. Benign lumpectomy changes with small postoperative seroma in the 1 o'clock position of the left breast. RECOMMENDATION: Diagnostic mammogram is suggested in 1 year. (Code:DM-B-01Y) I have discussed the findings and  recommendations with the patient. Results were also provided in writing at the conclusion of the visit. If applicable, a reminder letter will be sent to the patient regarding the next appointment. BI-RADS CATEGORY  2: Benign. Electronically Signed   By: Curlene Dolphin M.D.   On: 04/09/2015 10:02   US Breast Ltd Uni Right Inc Axilla  04/09/2015  CLINICAL DATA:  54 year old patient treated for left breast cancer in 2015. She presents with a palpable area of concern in the axillary tail of the right breast. She describes a feeling of tiny bumps. She also has an area of concern in the upper outer left breast near the lumpectomy scar. She reports some pain at the lumpectomy site. EXAM: DIGITAL DIAGNOSTIC BILATERAL MAMMOGRAM WITH 3D TOMOSYNTHESIS WITH CAD ULTRASOUND BILATERAL BREAST COMPARISON:  Previous exam(s). ACR Breast Density Category b: There are scattered areas of fibroglandular density. FINDINGS: Benign lumpectomy changes are noted in the deep upper outer left breast. Metallic skin markers are placed in the regions of patient concern. No mass, nonsurgical distortion, or suspicious microcalcification is identified in either breast. Spot tangential views of the regions of patient concern show scattered fibroglandular densities and no suspicious findings. Mammographic images were processed with CAD. On physical exam, there is a well-healed lumpectomy scar in the upper outer left breast. I do not palpate a mass in the upper-outer left breast or in the upper outer right breast in the regions of patient concern. Targeted ultrasound is performed, showing circumscribed benign seroma with simple anechoic fluid at 1 o'clock position 5 cm from the nipple in the left breast. This measures 3.0 cm x 0.6 cm x 1.9 cm. No solid or suspicious mass is identified in the upper outer left breast or the  upper-outer right breast. IMPRESSION: No evidence of malignancy in either breast. Benign lumpectomy changes with small postoperative  seroma in the 1 o'clock position of the left breast. RECOMMENDATION: Diagnostic mammogram is suggested in 1 year. (Code:DM-B-01Y) I have discussed the findings and recommendations with the patient. Results were also provided in writing at the conclusion of the visit. If applicable, a reminder letter will be sent to the patient regarding the next appointment. BI-RADS CATEGORY  2: Benign. Electronically Signed   By: Curlene Dolphin M.D.   On: 04/09/2015 10:02   Mm Diag Breast Tomo Bilateral  04/09/2015  CLINICAL DATA:  54 year old patient treated for left breast cancer in 2015. She presents with a palpable area of concern in the axillary tail of the right breast. She describes a feeling of tiny bumps. She also has an area of concern in the upper outer left breast near the lumpectomy scar. She reports some pain at the lumpectomy site. EXAM: DIGITAL DIAGNOSTIC BILATERAL MAMMOGRAM WITH 3D TOMOSYNTHESIS WITH CAD ULTRASOUND BILATERAL BREAST COMPARISON:  Previous exam(s). ACR Breast Density Category b: There are scattered areas of fibroglandular density. FINDINGS: Benign lumpectomy changes are noted in the deep upper outer left breast. Metallic skin markers are placed in the regions of patient concern. No mass, nonsurgical distortion, or suspicious microcalcification is identified in either breast. Spot tangential views of the regions of patient concern show scattered fibroglandular densities and no suspicious findings. Mammographic images were processed with CAD. On physical exam, there is a well-healed lumpectomy scar in the upper outer left breast. I do not palpate a mass in the upper-outer left breast or in the upper outer right breast in the regions of patient concern. Targeted ultrasound is performed, showing circumscribed benign seroma with simple anechoic fluid at 1 o'clock position 5 cm from the nipple in the left breast. This measures 3.0 cm x 0.6 cm x 1.9 cm. No solid or suspicious mass is identified in the  upper outer left breast or the upper-outer right breast. IMPRESSION: No evidence of malignancy in either breast. Benign lumpectomy changes with small postoperative seroma in the 1 o'clock position of the left breast. RECOMMENDATION: Diagnostic mammogram is suggested in 1 year. (Code:DM-B-01Y) I have discussed the findings and recommendations with the patient. Results were also provided in writing at the conclusion of the visit. If applicable, a reminder letter will be sent to the patient regarding the next appointment. BI-RADS CATEGORY  2: Benign. Electronically Signed   By: Curlene Dolphin M.D.   On: 04/09/2015 10:02     ASSESSMENT/PLAN: 54 y.o. [place], [place] woman   (1)  status post left breast biopsy 03/29/2013 for a clinical T2-T3 pN1, stage IIB-IIIA invasive ductal carcinoma, grade 2-3, estrogen receptor 39% positive (with moderate staining intensity), progesterone receptor negative, with an MIB-1 of 83%, and HER-2 amplified by CISH, with a ratio of 7.5 and 15 HER-2 copies per cell  (2) neoadjuvant chemotherapy, with trastuzumab/ pertuzumab/ carboplatin/ docetaxel repeated every 3 weeks x6, completed 08/21/2013. Pertuzumab  held after 4 cycles secondary to diarrhea. The patient received Neulasta on day 2 at [facility] per her request.   (3) Trastuzumab continued for total of one year (last dose 04/13/2014)). Most recent echo 06/04/2014 showed a well preserved ejection fraction.  (4)  status post left lumpectomy and axillary node dissection 09/12/2013, with a complete pathologic response (a total of 16 lymph nodes were examined,1 sentinel and 15 non-sentinel),  YpT0, ypN0.  (5) adjuvant radiation at [facility] completed 12/08/2013  (  6) started anastrozole 01/16/2014  (7) status post remote TAH/ BSO  PLAN: Jalee is doing fine as far as her breast cancer is concerned. The main problem she has her social and family. I wonder if one of our social workers could discuss these with her. The  patient is agreeable. Unfortunately I was not able to locate them today but I will send him a note and suggest a contact the patient and see if they can be of help.  She was very worried that she was having disease progression because of the mass in her left breast, but understands this is a benign seroma.  Otherwise she will return to see [place] in April 2017 and then again in October of next year. She will have a bone density and repeat mammography in early October 2017.  The plan overall is to continue anastrozole for total of 5 years.  Chauncey Cruel, MD  05/06/2015 12:12 PM

## 2015-05-06 NOTE — Telephone Encounter (Signed)
Appointments made and avs printed °

## 2015-07-06 ENCOUNTER — Other Ambulatory Visit: Payer: Self-pay | Admitting: Family Medicine

## 2015-07-17 ENCOUNTER — Ambulatory Visit: Payer: BLUE CROSS/BLUE SHIELD | Admitting: Family Medicine

## 2015-08-29 ENCOUNTER — Other Ambulatory Visit: Payer: Self-pay | Admitting: Family Medicine

## 2015-09-04 ENCOUNTER — Ambulatory Visit (INDEPENDENT_AMBULATORY_CARE_PROVIDER_SITE_OTHER): Payer: BLUE CROSS/BLUE SHIELD | Admitting: Nurse Practitioner

## 2015-09-04 ENCOUNTER — Encounter: Payer: Self-pay | Admitting: Nurse Practitioner

## 2015-09-04 VITALS — BP 128/82 | Temp 98.4°F | Ht 61.0 in | Wt 166.2 lb

## 2015-09-04 DIAGNOSIS — N6459 Other signs and symptoms in breast: Secondary | ICD-10-CM

## 2015-09-04 DIAGNOSIS — B372 Candidiasis of skin and nail: Secondary | ICD-10-CM | POA: Diagnosis not present

## 2015-09-04 DIAGNOSIS — R590 Localized enlarged lymph nodes: Secondary | ICD-10-CM

## 2015-09-04 MED ORDER — NYSTATIN 100000 UNIT/GM EX CREA: 1.0000 "application " | TOPICAL_CREAM | Freq: Two times a day (BID) | CUTANEOUS | Status: DC

## 2015-09-06 ENCOUNTER — Encounter: Payer: Self-pay | Admitting: Nurse Practitioner

## 2015-09-06 ENCOUNTER — Telehealth: Payer: Self-pay | Admitting: Family Medicine

## 2015-09-06 ENCOUNTER — Other Ambulatory Visit: Payer: Self-pay | Admitting: Nurse Practitioner

## 2015-09-06 MED ORDER — ALPRAZOLAM 1 MG PO TABS: ORAL_TABLET | ORAL | Status: DC

## 2015-09-06 NOTE — Telephone Encounter (Signed)
(  Message for Hoyle Sauer) patient was seen yesterday and got to ask for new prescription [facility]ax [facility]

## 2015-09-06 NOTE — Progress Notes (Signed)
Subjective:  Presents for c/o rash under her right breast for several days. Has had a similar rash before. Also c/o possible swollen lymph node in right axillary area. Also area in her right breast of concern. History of breast cancer.   Objective:   BP 128/82 mmHg  Temp(Src) 98.4 F (36.9 C) (Oral)  Ht 5\' 1"  (1.549 m)  Wt 166 lb 4 oz (75.411 kg)  BMI 31.43 kg/m2  LMP 12/04/2008 NAD. Alert, oriented. Lungs clear. Heart RRR. Shiny patch of hyperpigmented non raised rash under right breast. Soft nodular area in the mid right axillary fold. Fluctuant linear area in the upper outer right breast at approximately 11 o'clock.   Assessment: Abnormal breast exam - Plan: MM DIAG BREAST TOMO UNI RIGHT, US BREAST LTD UNI RIGHT INC AXILLA  Localized enlarged lymph nodes - Plan: MM DIAG BREAST TOMO UNI RIGHT, US BREAST LTD UNI RIGHT INC AXILLA  Yeast dermatitis  Plan:  Meds ordered this encounter  Medications  . nabumetone (RELAFEN) 500 MG tablet    Sig: Take 500 mg by mouth 2 (two) times daily.  Marland Kitchen nystatin cream (MYCOSTATIN)    Sig: Apply 1 application topically 2 (two) times daily.    Dispense:  30 g    Refill:  0    Order Specific Question:  Supervising Provider    Answer:  Mikey Kirschner [[id number]]   Imaging results pending. Further follow up based on results.

## 2015-09-10 ENCOUNTER — Ambulatory Visit (HOSPITAL_COMMUNITY): Admission: RE | Admit: 2015-09-10 | Payer: BLUE CROSS/BLUE SHIELD | Source: Ambulatory Visit

## 2015-09-10 ENCOUNTER — Ambulatory Visit (HOSPITAL_COMMUNITY)
Admission: RE | Admit: 2015-09-10 | Discharge: 2015-09-10 | Disposition: A | Discharge status: Home / Self Care | Payer: BLUE CROSS/BLUE SHIELD | Source: Ambulatory Visit | Attending: Nurse Practitioner | Admitting: Nurse Practitioner

## 2015-09-10 ENCOUNTER — Other Ambulatory Visit: Payer: Self-pay | Admitting: Nurse Practitioner

## 2015-09-10 ENCOUNTER — Other Ambulatory Visit (HOSPITAL_COMMUNITY): Payer: Self-pay

## 2015-09-10 DIAGNOSIS — N6459 Other signs and symptoms in breast: Secondary | ICD-10-CM | POA: Diagnosis present

## 2015-09-10 DIAGNOSIS — R590 Localized enlarged lymph nodes: Secondary | ICD-10-CM

## 2015-10-08 ENCOUNTER — Telehealth: Payer: Self-pay | Admitting: Family Medicine

## 2015-10-08 ENCOUNTER — Other Ambulatory Visit: Payer: Self-pay

## 2015-10-08 DIAGNOSIS — C50412 Malignant neoplasm of upper-outer quadrant of left female breast: Secondary | ICD-10-CM

## 2015-10-08 NOTE — Telephone Encounter (Signed)
Patient would like for you to review her new blood work that was done via her  Husbands new insurance.  She feels she may need a med change in her cholesterol   Please review and advise   Put in your yellow folder in your office

## 2015-10-09 ENCOUNTER — Other Ambulatory Visit (HOSPITAL_BASED_OUTPATIENT_CLINIC_OR_DEPARTMENT_OTHER): Payer: BLUE CROSS/BLUE SHIELD

## 2015-10-09 ENCOUNTER — Ambulatory Visit (HOSPITAL_BASED_OUTPATIENT_CLINIC_OR_DEPARTMENT_OTHER): Payer: BLUE CROSS/BLUE SHIELD | Admitting: Nurse Practitioner

## 2015-10-09 ENCOUNTER — Encounter: Payer: Self-pay | Admitting: Nurse Practitioner

## 2015-10-09 ENCOUNTER — Telehealth: Payer: Self-pay | Admitting: Oncology

## 2015-10-09 VITALS — BP 118/72 | HR 63 | Temp 97.9°F | Resp 18 | Ht 61.0 in | Wt 166.1 lb

## 2015-10-09 DIAGNOSIS — C50412 Malignant neoplasm of upper-outer quadrant of left female breast: Secondary | ICD-10-CM

## 2015-10-09 DIAGNOSIS — E2839 Other primary ovarian failure: Secondary | ICD-10-CM

## 2015-10-09 DIAGNOSIS — Z79811 Long term (current) use of aromatase inhibitors: Secondary | ICD-10-CM

## 2015-10-09 DIAGNOSIS — M25552 Pain in left hip: Secondary | ICD-10-CM

## 2015-10-09 DIAGNOSIS — N898 Other specified noninflammatory disorders of vagina: Secondary | ICD-10-CM

## 2015-10-09 LAB — CBC WITH DIFFERENTIAL/PLATELET
BASO%: 0.3 % (ref 0.0–2.0)
Basophils Absolute: 0 10*3/uL (ref 0.0–0.1)
EOS%: 1 % (ref 0.0–7.0)
Eosinophils Absolute: 0.1 10*3/uL (ref 0.0–0.5)
HCT: 37.4 % (ref 34.8–46.6)
HGB: 12.1 g/dL (ref 11.6–15.9)
LYMPH%: 20.5 % (ref 14.0–49.7)
MCH: 27.9 pg (ref 25.1–34.0)
MCHC: 32.3 g/dL (ref 31.5–36.0)
MCV: 86.2 fL (ref 79.5–101.0)
MONO#: 0.6 10*3/uL (ref 0.1–0.9)
MONO%: 8.8 % (ref 0.0–14.0)
NEUT#: 4.6 10*3/uL (ref 1.5–6.5)
NEUT%: 69.4 % (ref 38.4–76.8)
Platelets: 177 10*3/uL (ref 145–400)
RBC: 4.33 10*6/uL (ref 3.70–5.45)
RDW: 14 % (ref 11.2–14.5)
WBC: 6.6 10*3/uL (ref 3.9–10.3)
lymph#: 1.4 10*3/uL (ref 0.9–3.3)

## 2015-10-09 LAB — COMPREHENSIVE METABOLIC PANEL
ALT: 19 U/L (ref 0–55)
AST: 17 U/L (ref 5–34)
Albumin: 3.8 g/dL (ref 3.5–5.0)
Alkaline Phosphatase: 56 U/L (ref 40–150)
Anion Gap: 7 mEq/L (ref 3–11)
BUN: 12.2 mg/dL (ref 7.0–26.0)
CO2: 30 mEq/L — ABNORMAL HIGH (ref 22–29)
Calcium: 9.6 mg/dL (ref 8.4–10.4)
Chloride: 104 mEq/L (ref 98–109)
Creatinine: 0.9 mg/dL (ref 0.6–1.1)
EGFR: 83 mL/min/{1.73_m2} — ABNORMAL LOW (ref >90)
Glucose: 96 mg/dl (ref 70–140)
Potassium: 3.9 mEq/L (ref 3.5–5.1)
Sodium: 142 mEq/L (ref 136–145)
Total Bilirubin: 0.44 mg/dL (ref 0.20–1.20)
Total Protein: 7.2 g/dL (ref 6.4–8.3)

## 2015-10-09 NOTE — Telephone Encounter (Signed)
Gave and printed appt sched and avs fo rpt for OCT °

## 2015-10-09 NOTE — Progress Notes (Signed)
ID: [name] OB: [birth year]  MR#: [id number]  MBE#:[id number]  PCP: [name] Hillier, MD ; [name] Forster, NP GYN:   [name] Half, MD  SU: [name] Overall, MD [place]:  [name] Silversmith, MD OTHER MD: [name] Busman, MD;  [name] Bonito, MD, [name] Crate MD  CHIEF COMPLAINT:  Left Breast Cancer  CURRENT TREATMENT: Anastrozole daily  BREAST CANCER HISTORY: From the original intake note 04/12/2013:  [name] [name] palpated a mass in her left breast in late August 2014. She was already scheduled for screening [facility] and this was performed 03/13/2013. Indeed a possible mass was noted in the left breast and on 03/29/2013 the patient underwent diagnostic left mammography and left ultrasonography. This showed a 2.5 cm irregular mass in the upper outer quadrant of the left breast. This was palpable.by ultrasound there was a 1.6 cm irregular hypoechoic mass in the area in question, and in addition to enlarged left axillary lymph nodes were noted, the largest measuring 2.5 cm.  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  Bilateral breast MRI 04/11/2013 shows 3 abnormal enhancing masses in the upper outer quadrant of the left breast measuring altogether 6.4 cm. The largest separate mass measured 2.7 cm. There were abnormal or large lymph nodes in the left axilla. Ultrasound guided biopsy of the 2 smaller masses has been scheduled.  The patient's subsequent history is as detailed below   INTERVAL HISTORY: [name] [name] returns today for follow up of her left breast cancer. She continues on anastrozole since July 2015. She has moderate vaginal drynes from this.  She also has a history of bilateral hip pain and bursitis to the left hip. Lately her left femur and left lower leg has had pain. She was told by Dr. Rita [name] that this is likely sciatic nerve pain. A recent MRI showed bursitis to the left hip, no evidence of metastasis. Both hips are routinely injected with steroids. She takes no more than tylenol for the pain.  REVIEW OF SYSTEMS: Outside of the hip, [name] [name] is stable. She recently had a fungal infection under her breasts that cleared with nystatin cream. She denies fevers, chills, nausea, vomiting, or changes in bowel or bladder habits. She has chronic headaches. She does not sleep well. She is under a lot of stress and anxiety with her home situation. She has 2 daughters that are pregnant, and she feels that the responsibility for raising these children will ultimately land on her shoulders. One of the daughters is prone to drug use and disappearing from the home days at a time. [name] [name] is prescribed xanax and lexapro. A detailed review of systems is otherwise stable.   PAST MEDICAL HISTORY: Past Medical History  Diagnosis Date  . Chest pain 2009    Consultation-[name] [name], negative chest CT; nl echo in 2005; h/o palpitations  . Palpitations   . Degenerative joint disease     + degenerative joint disease of the lumbosacral spine  . Colitis 2010    not IBD  . Herpes simplex type II infection   . Allergic rhinitis   . Hypercholesteremia     "slightly high"  . Anxiety   . Heart  murmur     "small"  . GERD (gastroesophageal reflux disease)     "a little"  . Depression   . Anemia, unspecified 05/30/2013  . Wears glasses   . Breast cancer ([place]) 03/31/13    left  . Cancer ([place])   . Dysrhythmia     palpations  . Hypertension     does not have high blood pressure  . Headache   . Meningitis 08/22/2014    PAST SURGICAL HISTORY: Past Surgical History  Procedure Laterality Date  . Colonoscopy  10/2010    proctitis; melanosis coli  . Tubal  ligation    . Breast excisional biopsy  04/2003    Left; benign disease  . Right oophorectomy  03/13/2007  . Abdominal hysterectomy  03/13/2007    TAH ?BSO--Dr [name] [name], [place]  . Eye surgery Left     "fix lazy eye"  . Portacath placement Right 04/21/2013    Procedure: INSERTION PORT-A-CATH;  Surgeon: [name], MD;  Location: [facility];  Service: General;  Laterality: Right;  . Breast lumpectomy with needle localization and axillary lymph node dissection Left 09/12/2013    Procedure: BREAST LUMPECTOMY WITH NEEDLE LOCALIZATION AND AXILLARY LYMPH NODE DISSECTION;  Surgeon: [name], MD;  Location: [facility];  Service: General;  Laterality: Left;  and axilla  . Breast surgery    . Suboccipital craniectomy cervical laminectomy N/A 05/10/2014    Procedure: SUBOCCIPITAL CRANIECTOMY CERVICAL LAMINECTOMY/DURAPLASTY;  Surgeon: [name], MD;  Location: [facility];  Service: Neurosurgery;  Laterality: N/A;  suboccipital craniectomy with cervical laminectomy and duraplasty    FAMILY HISTORY Family History  Problem Relation Age of Onset  . Aneurysm Mother     Cerebral  . Hypertension Mother   . Hyperlipidemia Mother   . Stroke Mother   . Coronary artery disease Father   . Hypertension Father   . Hyperlipidemia Father   . Lung cancer Brother   . Cancer Brother     Lung  The patient's father died at the age of 84 from a myocardial infarction. The patient's mother died at the age of 76 from a stroke. The patient had 6 brothers, 2 sisters. One brother has a history of lung cancer in the setting of tobacco abuse. Another brother had multiple myeloma. There is no history of breast or ovarian cancer in the family  GYNECOLOGIC HISTORY: (Updated 09/25/2013) Menarche age 78, first live birth age 15, the patient is [name] [name] P4. She status post abdominal hysterectomy with bilateral salpingo-oophorectomy. She never took hormone replacement.   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 Her husband [name] works in Charity fundraiser. Daughter [name] [name] formerly lived at home with the patient with her 3 children, all under the age of 9; they have subsequently moved out. She is not employed. Son [name] [name] lives in [place] and is not employed. Daughter [name] [name] lives in [place] and is a Barrister's clerk. Daughter [name] [name] is a Education officer, museum in [place]. The patient has 10 grandchildren. She attends [employer]     ADVANCED DIRECTIVES: Not in place   HEALTH MAINTENANCE:  (Updated 09/25/2013)  Social History  Substance Use Topics  . Smoking status: Former Smoker -- 0.50 packs/day for 15 years    Types: Cigarettes    Quit date: 07/06/1982  . Smokeless tobacco: Never Used  . Alcohol Use: No  Colonoscopy:2012  PAP:2014   Bone density: Never  Lipid panel: Not on file    Allergies  Allergen Reactions  . Doxycycline Other (See Comments)    Unknown    Current Outpatient Prescriptions  Medication Sig Dispense Refill  . acetaminophen (TYLENOL) 500 MG tablet Take 500 mg by mouth every 6 (six) hours as needed for moderate pain.     Marland Kitchen ALPRAZolam (XANAX) 1 MG tablet TAKE 1 TABLET THREE TIMES A DAY AS NEEDED FOR ANXIETY 60 tablet 5  . anastrozole (ARIMIDEX) 1 MG tablet Take 1 tablet (1 mg total) by mouth daily. 90 tablet 3  . atenolol (TENORMIN) 50 MG tablet TAKE 1 TABLET BY MOUTH EVERY DAY 90 tablet 1  . buPROPion (WELLBUTRIN SR) 150 MG 12 hr tablet TAKE 1 TABLET TWICE A DAY 180 tablet 0  . Cholecalciferol (VITAMIN D PO) Take 1 tablet by mouth daily. Pt takes 1000 units daily.    . diphenhydramine-acetaminophen (TYLENOL PM) 25-500 MG TABS Take 2 tablets by mouth at bedtime.    Marland Kitchen escitalopram (LEXAPRO) 20 MG tablet TAKE 1 TABLET (20 MG TOTAL) BY MOUTH DAILY. 90 tablet 0  . nabumetone (RELAFEN) 500 MG tablet Take 500 mg by mouth 2 (two) times  daily.    Marland Kitchen nystatin cream (MYCOSTATIN) Apply 1 application topically 2 (two) times daily. 30 g 0  . pravastatin (PRAVACHOL) 40 MG tablet Take 1 tablet (40 mg total) by mouth daily. 90 tablet 1  . valACYclovir (VALTREX) 1000 MG tablet TAKE 1 TABLET BY MOUTH DAILY 30 tablet 3  . [DISCONTINUED] potassium chloride SA (K-DUR,KLOR-CON) 20 MEQ tablet Take 1 tablet (20 mEq total) by mouth 2 (two) times daily. 20 tablet 1   No current facility-administered medications for this visit.    OBJECTIVE: Middle-aged [place] American woman in no acute distress Filed Vitals:   10/09/15 1134  BP: 118/72  Pulse: 63  Temp: 97.9 F (36.6 C)  Resp: 18  Body mass index is 31.4 kg/(m^2).  ECOG: 1 Filed Weights   10/09/15 1134  Weight: 166 lb 1.6 oz (75.342 kg)    Skin: warm, dry  HEENT: sclerae anicteric, conjunctivae pink, oropharynx clear. No thrush or mucositis.  Lymph Nodes: No cervical or supraclavicular lymphadenopathy  Lungs: clear to auscultation bilaterally, no rales, wheezes, or rhonci  Heart: regular rate and rhythm  Abdomen: round, soft, non tender, positive bowel sounds  Musculoskeletal: No focal spinal tenderness, no peripheral edema  Neuro: non focal, well oriented, positive affect  Breasts: left breast status post lumpectomy and radiation. No evidence of recurrent disease. Left axilla benign. Right breast unremarkable   LAB RESULTS:   Lab Results  Component Value Date   WBC 6.6 10/09/2015   NEUTROABS 4.6 10/09/2015   HGB 12.1 10/09/2015   HCT 37.4 10/09/2015   MCV 86.2 10/09/2015   PLT 177 10/09/2015      Chemistry      Component Value Date/Time   NA 142 10/09/2015 1122   NA 139 08/23/2014 0643   K 3.9 10/09/2015 1122   K 3.6 08/23/2014 0643   CL 105 08/23/2014 0643   CO2 30* 10/09/2015 1122   CO2 28 08/23/2014 0643   BUN 12.2 10/09/2015 1122   BUN 10 08/23/2014 0643   CREATININE 0.9 10/09/2015 1122   CREATININE 0.96 08/23/2014 0643      Component Value Date/Time    CALCIUM 9.6 10/09/2015 1122   CALCIUM 8.8 08/23/2014 0643   ALKPHOS 56 10/09/2015 1122   ALKPHOS 64  01/15/2015 1114   AST 17 10/09/2015 1122   AST 13 01/15/2015 1114   ALT 19 10/09/2015 1122   ALT 16 01/15/2015 1114   BILITOT 0.44 10/09/2015 1122   BILITOT 0.3 01/15/2015 1114   BILITOT 0.7 08/22/2014 1138         STUDIES: US Breast Ltd Uni Right Inc Axilla  09/16/2015  ADDENDUM REPORT: 09/16/2015 15:19 ADDENDUM: This report is created to correct typographical errors in the initial report. The clinical data section should read that the patient is 55 years old. The title of the exam should read as follows: DIGITAL DIAGNOSTIC RIGHT MAMMOGRAM WITH 3D TOMOSYNTHESIS WITH CAD ULTRASOUND RIGHT BREAST Electronically Signed   By: Curlene Dolphin M.D.   On: 09/16/2015 15:19  09/16/2015  CLINICAL DATA:  55 year old patient with history of left breast cancer, diagnosed in 2015. She presents today with palpable areas of concern in the far outer right breast and in the medial periareolar right breast. EXAM: DIGITAL DIAGNOSTIC LEFT MAMMOGRAM WITH 3D TOMOSYNTHESIS WITH CAD ULTRASOUND LEFT BREAST COMPARISON:  Previous exam(s). ACR Breast Density Category a: The breast tissue is almost entirely fatty. FINDINGS: No mass, distortion, or suspicious microcalcification is identified in the right breast. Spot tangential views of the 2 regions of patient concern are negative. Mammographic images were processed with CAD. On physical exam, no mass is palpated in the far outer right breast or in the medial periareolar right breast. The breast tissue is soft to palpation. Targeted ultrasound is performed, showing normal fatty breast parenchyma in the regions of patient concern. No solid or cystic mass or abnormal shadowing is identified. IMPRESSION: No evidence of malignancy in the right breast. RECOMMENDATION: Bilateral diagnostic mammogram is recommended in October 2017. I have discussed the findings and recommendations  with the patient. Results were also provided in writing at the conclusion of the visit. If applicable, a reminder letter will be sent to the patient regarding the next appointment. BI-RADS CATEGORY  1: Negative. Electronically Signed: By: Curlene Dolphin M.D. On: 09/10/2015 14:10   Mm Diag Breast Tomo Uni Right  09/16/2015  ADDENDUM REPORT: 09/16/2015 15:19 ADDENDUM: This report is created to correct typographical errors in the initial report. The clinical data section should read that the patient is 55 years old. The title of the exam should read as follows: DIGITAL DIAGNOSTIC RIGHT MAMMOGRAM WITH 3D TOMOSYNTHESIS WITH CAD ULTRASOUND RIGHT BREAST Electronically Signed   By: Curlene Dolphin M.D.   On: 09/16/2015 15:19  09/16/2015  CLINICAL DATA:  55 year old patient with history of left breast cancer, diagnosed in 2015. She presents today with palpable areas of concern in the far outer right breast and in the medial periareolar right breast. EXAM: DIGITAL DIAGNOSTIC LEFT MAMMOGRAM WITH 3D TOMOSYNTHESIS WITH CAD ULTRASOUND LEFT BREAST COMPARISON:  Previous exam(s). ACR Breast Density Category a: The breast tissue is almost entirely fatty. FINDINGS: No mass, distortion, or suspicious microcalcification is identified in the right breast. Spot tangential views of the 2 regions of patient concern are negative. Mammographic images were processed with CAD. On physical exam, no mass is palpated in the far outer right breast or in the medial periareolar right breast. The breast tissue is soft to palpation. Targeted ultrasound is performed, showing normal fatty breast parenchyma in the regions of patient concern. No solid or cystic mass or abnormal shadowing is identified. IMPRESSION: No evidence of malignancy in the right breast. RECOMMENDATION: Bilateral diagnostic mammogram is recommended in October 2017. I have discussed the findings and recommendations  with the patient. Results were also provided in writing at the  conclusion of the visit. If applicable, a reminder letter will be sent to the patient regarding the next appointment. BI-RADS CATEGORY  1: Negative. Electronically Signed: By: Curlene Dolphin M.D. On: 09/10/2015 14:10     ASSESSMENT/PLAN: 55 y.o. [place], [place] woman   (1)  status post left breast biopsy 03/29/2013 for a clinical T2-T3 pN1, stage IIB-IIIA invasive ductal carcinoma, grade 2-3, estrogen receptor 39% positive (with moderate staining intensity), progesterone receptor negative, with an MIB-1 of 83%, and HER-2 amplified by CISH, with a ratio of 7.5 and 15 HER-2 copies per cell  (2) neoadjuvant chemotherapy, with trastuzumab/ pertuzumab/ carboplatin/ docetaxel repeated every 3 weeks x6, completed 08/21/2013. Pertuzumab  held after 4 cycles secondary to diarrhea. The patient received Neulasta on day 2 at [facility] per her request.   (3) Trastuzumab continued for total of one year (last dose 04/13/2014)). Most recent echo 06/04/2014 showed a well preserved ejection fraction.  (4)  status post left lumpectomy and axillary node dissection 09/12/2013, with a complete pathologic response (a total of 16 lymph nodes were examined,1 sentinel and 15 non-sentinel),  YpT0, ypN0.  (5) adjuvant radiation at [facility] completed 12/08/2013  (6) started anastrozole 01/16/2014  (7) status post remote TAH/ BSO  PLAN: Aiyla's discomfort is becoming an issue. Given that the pain is generally on the left side, I do not believe that anastrozole is contributing in a significant way. She still has her reservations. I offered her a drug holiday for 1 month. If her pain improves significantly, we have already discussed the option to switch to tamoxifen for the duration of her antiestrogen therapy. If not, she will restart the anastrozole. I advised her to use coconut oil for her vaginal dryness.  Selinda is due for her next mammogram and bone density scan this October. I have place the appropriate orders  for this during our visit today.   I offered her the support of our social workers for her home situation, but she declined.   Keelia will return later in October for follow up with Dr. Jana Hakim. She understands and agrees with this plan. She knows the goal of treatment in her case is cure. She has been encouraged to call with any issues that might arise before her next visit here.  Laurie Panda, NP 10/09/2015 1:03 PM

## 2015-10-15 ENCOUNTER — Other Ambulatory Visit: Payer: Self-pay | Admitting: Family Medicine

## 2015-10-15 NOTE — Telephone Encounter (Signed)
Actually LDL chol substantially better and hdl very strong and liv enzymes fine so rec cst, give 6 mo worth of prav refill

## 2015-10-16 MED ORDER — PRAVASTATIN SODIUM 40 MG PO TABS: 40.0000 mg | ORAL_TABLET | Freq: Every day | ORAL | Status: DC

## 2015-10-16 NOTE — Telephone Encounter (Signed)
Left message to return call 

## 2015-10-16 NOTE — Addendum Note (Signed)
Addended by: Dairl Ponder on: 10/16/2015 02:13 PM   Modules accepted: Orders

## 2015-10-16 NOTE — Telephone Encounter (Signed)
Results discussed with patient. Patient advised  LDL cholesterol was substantially better and hdl very strong and liver enzymes fine so rec cst, give 6 mo worth of pravachol refill. Patient verbalized understanding and rx sent electronically to pharmacy.

## 2015-10-21 ENCOUNTER — Encounter (HOSPITAL_COMMUNITY): Payer: Self-pay

## 2015-11-04 ENCOUNTER — Telehealth: Payer: Self-pay

## 2015-11-04 ENCOUNTER — Telehealth: Payer: Self-pay | Admitting: *Deleted

## 2015-11-04 NOTE — Telephone Encounter (Signed)
Called to f/u with pt since taking a month vacation from the Arimidex to see if her joint pain is any better. Last time pt came into office had complaints of severe arthritic pain since being on the Arimidex. No answer but left message on VM asking pt to call this nurse back.

## 2015-11-04 NOTE — Telephone Encounter (Signed)
Patient returning Natro's call today.  Patient wants her to know that she is starting back on the anastrozole today after being off it for one month.   Patient states that "there were no changes when she was off of the anastrozole".

## 2015-11-05 ENCOUNTER — Other Ambulatory Visit: Payer: Self-pay | Admitting: Family Medicine

## 2015-11-06 ENCOUNTER — Telehealth: Payer: Self-pay | Admitting: *Deleted

## 2015-11-06 NOTE — Telephone Encounter (Signed)
Called and spoke with pt about her anastrozole "vacation". Pt saw no change in her joint discomfort so she started medication back on 11/05/15. Message to be fwd to Gentry Fitz, NP.

## 2015-11-13 ENCOUNTER — Ambulatory Visit: Payer: BLUE CROSS/BLUE SHIELD | Admitting: Neurology

## 2015-11-15 ENCOUNTER — Encounter: Payer: Self-pay | Admitting: Nurse Practitioner

## 2015-11-15 ENCOUNTER — Encounter: Payer: Self-pay | Admitting: Family Medicine

## 2015-11-15 ENCOUNTER — Ambulatory Visit (INDEPENDENT_AMBULATORY_CARE_PROVIDER_SITE_OTHER): Payer: BLUE CROSS/BLUE SHIELD | Admitting: Nurse Practitioner

## 2015-11-15 VITALS — BP 122/70 | Ht 61.0 in | Wt 163.1 lb

## 2015-11-15 DIAGNOSIS — K219 Gastro-esophageal reflux disease without esophagitis: Secondary | ICD-10-CM

## 2015-11-15 MED ORDER — PANTOPRAZOLE SODIUM 40 MG PO TBEC: 40.0000 mg | DELAYED_RELEASE_TABLET | Freq: Every day | ORAL | Status: DC

## 2015-11-16 ENCOUNTER — Encounter: Payer: Self-pay | Admitting: Nurse Practitioner

## 2015-11-16 NOTE — Progress Notes (Signed)
Subjective:  Presents for c/o flare up of her GERD for the past 3-4 weeks. Restarted Protonix about 2 weeks ago. Has seen slight improvement. Began after taking 2 Aleve about a month ago for joint pain. Minimal caffeine intake. Nonsmoker. No alcohol use. Occasionally better after eating. Nausea, no vomiting. Unassociated with any particular foods. Pain and bloating in the epigastric area. No back pain. Occasional constipation. No blood in stool. Normal color. Slight relief with TUMS.  Objective:   BP 122/70 mmHg  Ht 5\' 1"  (1.549 m)  Wt 163 lb 2 oz (73.993 kg)  BMI 30.84 kg/m2  LMP 12/04/2008 NAD. Alert, oriented. Lungs clear. Heart RRR. Distinct epigastric area tenderness on exam. No rebound or guarding. No obvious masses.   Assessment:  Problem List Items Addressed This Visit      Digestive   GERD (gastroesophageal reflux disease) - Primary   Relevant Medications   pantoprazole (PROTONIX) 40 MG tablet   Other Relevant Orders   Ambulatory referral to Gastroenterology     Plan:  Meds ordered this encounter  Medications  . pantoprazole (PROTONIX) 40 MG tablet    Sig: Take 1 tablet (40 mg total) by mouth daily.    Dispense:  30 tablet    Refill:  2    Order Specific Question:  Supervising Provider    Answer:  Mikey Kirschner [[id number]]   Continue Protonix. Refer to GI specialist. Warning signs reviewed. Call back sooner if any problems. Use TUMS or Maalox as directed prn.

## 2015-11-18 ENCOUNTER — Other Ambulatory Visit: Payer: Self-pay | Admitting: Neurology

## 2015-11-18 DIAGNOSIS — I6529 Occlusion and stenosis of unspecified carotid artery: Secondary | ICD-10-CM

## 2015-11-18 DIAGNOSIS — R200 Anesthesia of skin: Secondary | ICD-10-CM

## 2015-11-20 ENCOUNTER — Ambulatory Visit
Admission: RE | Admit: 2015-11-20 | Discharge: 2015-11-20 | Disposition: A | Discharge status: Home / Self Care | Payer: BLUE CROSS/BLUE SHIELD | Source: Ambulatory Visit | Attending: Neurology | Admitting: Neurology

## 2015-11-20 DIAGNOSIS — I6529 Occlusion and stenosis of unspecified carotid artery: Secondary | ICD-10-CM

## 2015-11-20 DIAGNOSIS — R200 Anesthesia of skin: Secondary | ICD-10-CM

## 2015-11-21 ENCOUNTER — Encounter: Payer: Self-pay | Admitting: Gastroenterology

## 2015-11-25 ENCOUNTER — Ambulatory Visit
Admission: RE | Admit: 2015-11-25 | Discharge: 2015-11-25 | Disposition: A | Discharge status: Home / Self Care | Payer: BLUE CROSS/BLUE SHIELD | Source: Ambulatory Visit | Attending: Neurology | Admitting: Neurology

## 2015-11-25 DIAGNOSIS — R200 Anesthesia of skin: Secondary | ICD-10-CM

## 2015-11-25 MED ORDER — GADOBENATE DIMEGLUMINE 529 MG/ML IV SOLN
15.0000 mL | Freq: Once | INTRAVENOUS | Status: AC | PRN
  Administered 2015-11-25: 15 mL via INTRAVENOUS

## 2015-12-10 ENCOUNTER — Encounter: Payer: Self-pay | Admitting: Nurse Practitioner

## 2015-12-10 ENCOUNTER — Ambulatory Visit (INDEPENDENT_AMBULATORY_CARE_PROVIDER_SITE_OTHER): Payer: BLUE CROSS/BLUE SHIELD | Admitting: Nurse Practitioner

## 2015-12-10 VITALS — BP 126/80 | HR 57 | Temp 98.2°F | Ht 61.0 in | Wt 163.8 lb

## 2015-12-10 DIAGNOSIS — K219 Gastro-esophageal reflux disease without esophagitis: Secondary | ICD-10-CM

## 2015-12-10 DIAGNOSIS — K5900 Constipation, unspecified: Secondary | ICD-10-CM | POA: Diagnosis not present

## 2015-12-10 DIAGNOSIS — K589 Irritable bowel syndrome without diarrhea: Secondary | ICD-10-CM | POA: Insufficient documentation

## 2015-12-10 NOTE — Progress Notes (Signed)
Primary Care Physician:  Mickie Hillier, MD Primary Gastroenterologist:  Dr. [name]  Chief Complaint  Patient presents with  . Gastroesophageal Reflux  . Bloated    HPI:   [name] [name] is a 55 y.o. female who presents on referral from primary care for GERD. Last saw primary care 11/15/2015 at which point she complained of a flare up of her GERD symptoms to the previous 3-4 weeks, restart Protonix 2 weeks ago was slight improvement. Also noted to begin taking Aleve, 2 a day, about a month ago for joint pain. Associated with nausea, no identified trigger foods. Symptoms are pain and bloating in the epigastric area, some relief after times, no blood noted in her stools. Recommended continue Protonix, referred to GI.  Today she states her symptoms are improved. Previously on Protonix "for years." Stopped Protonix a couple months ago to prevent low bone density. At that point her symptoms returned. She is still taking Protonix and symptoms are improved with breakthrough about once a week. If she stops PPI, symptoms return "wide open" a couple days later. Symptoms include epigastric pain/bloating, bitter taste, esophageal burning. Denies hematochezia, melena. Typically needs a suppository to have a bowel movement but states "I'm not constipated, the stools are soft. I dunno if I just can't push or what, I've been doing this for a long long time." Avoiding all NSAIDs.  Colonoscopy last done 8-9 years ago in [place] 5 years ago and was essentially normal, no indicatio of early interval repeat, likely ok for 5 more years.  Past Medical History  Diagnosis Date  . Chest pain 2009    Consultation-[facility]othbart, negative chest CT; nl echo in 2005; h/o palpitations  . Palpitations   . Degenerative joint disease     + degenerative joint disease of the lumbosacral spine  . Colitis 2010    not IBD  . Herpes simplex type II infection   . Allergic rhinitis   . Hypercholesteremia     "slightly high"  .  Anxiety   . Heart murmur     "small"  . GERD (gastroesophageal reflux disease)     "a little"  . Depression   . Anemia, unspecified 05/30/2013  . Wears glasses   . Breast cancer ([place]) 03/31/13    left  . Cancer ([place])   . Dysrhythmia     palpations  . Hypertension     does not have high blood pressure  . Headache   . 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 Colonoscopy  10/2010    proctitis; melanosis coli  . Tubal ligation    . Breast excisional biopsy  04/2003    Left; benign disease  . Right oophorectomy  03/13/2007  . Abdominal hysterectomy  03/13/2007    TAH ?BSO--Dr Levin Bacon,   . Eye surgery Left     "fix lazy eye"  . Portacath placement Right 04/21/2013    Procedure: INSERTION PORT-A-CATH;  Surgeon: [name], MD;  Location: [facility];  Service: General;  Laterality: Right;  . Breast lumpectomy with needle localization and axillary lymph node dissection Left 09/12/2013    Procedure: BREAST LUMPECTOMY WITH NEEDLE LOCALIZATION AND AXILLARY LYMPH NODE DISSECTION;  Surgeon: [name], MD;  Location: [place];  Service: General;  Laterality: Left;  and axilla  . Breast surgery    . Suboccipital craniectomy cervical laminectomy N/A 05/10/2014    Procedure: SUBOCCIPITAL CRANIECTOMY CERVICAL LAMINECTOMY/DURAPLASTY;  Surgeon: Okey Regal  Sherwood Gambler, MD;  Location: [facility];  Service: Neurosurgery;  Laterality: N/A;  suboccipital craniectomy with cervical laminectomy and duraplasty    Current Outpatient Prescriptions  Medication Sig Dispense Refill  . acetaminophen (TYLENOL) 500 MG tablet Take 500 mg by mouth every 6 (six) hours as needed for moderate pain.     Marland Kitchen ALPRAZolam (XANAX) 1 MG tablet TAKE 1 TABLET THREE TIMES A DAY AS NEEDED FOR ANXIETY 60 tablet 5  . anastrozole (ARIMIDEX) 1 MG tablet Take 1 tablet (1 mg total) by mouth daily. 90 tablet 3  . atenolol (TENORMIN) 50 MG tablet TAKE 1 TABLET BY MOUTH EVERY DAY  90 tablet 0  . buPROPion (WELLBUTRIN SR) 150 MG 12 hr tablet TAKE 1 TABLET TWICE A DAY 180 tablet 0  . Cholecalciferol (VITAMIN D PO) Take 1 tablet by mouth daily. Pt takes 1000 units daily.    . diphenhydramine-acetaminophen (TYLENOL PM) 25-500 MG TABS Take 2 tablets by mouth at bedtime.    Marland Kitchen escitalopram (LEXAPRO) 20 MG tablet TAKE 1 TABLET BY MOUTH EVERY DAY NEEDS OFFICE VISIT 30 tablet 3  . nabumetone (RELAFEN) 500 MG tablet Take 500 mg by mouth 2 (two) times daily.    Marland Kitchen nystatin cream (MYCOSTATIN) Apply 1 application topically 2 (two) times daily. 30 g 0  . pantoprazole (PROTONIX) 40 MG tablet Take 1 tablet (40 mg total) by mouth daily. 30 tablet 2  . pravastatin (PRAVACHOL) 40 MG tablet Take 1 tablet (40 mg total) by mouth daily. 90 tablet 1  . valACYclovir (VALTREX) 1000 MG tablet TAKE 1 TABLET BY MOUTH DAILY 30 tablet 3  . [DISCONTINUED] potassium chloride SA (K-DUR,KLOR-CON) 20 MEQ tablet Take 1 tablet (20 mEq total) by mouth 2 (two) times daily. 20 tablet 1   No current facility-administered medications for this visit.    Allergies as of 12/10/2015 - Review Complete 12/10/2015  Allergen Reaction Noted  . Doxycycline Other (See Comments) 06/27/2012    Family History  Problem Relation Age of Onset  . Aneurysm Mother     Cerebral  . Hypertension Mother   . Hyperlipidemia Mother   . Stroke Mother   . Coronary artery disease Father   . Hypertension Father   . Hyperlipidemia Father   . Lung cancer Brother   . Cancer Brother     Lung  . Colon cancer Neg Hx     Social History   Social History  . Marital Status: Married    Spouse Name: N/A  . Number of Children: 4  . Years of Education: N/A   Occupational History  . Factory work [employer]   Social History Main Topics  . Smoking status: Former Smoker -- 0.50 packs/day for 15 years    Types: Cigarettes    Quit date: 07/06/1982  . Smokeless tobacco: Never Used  . Alcohol Use: No  . Drug Use: No  . Sexual  Activity:    Partners: Male    Birth Control/ Protection: Surgical     Comment: TAH   Other Topics Concern  . Not on file   Social History Narrative    Review of Systems: 10-point ROS negative except as per HPI.    Physical Exam: BP 126/80 mmHg  Pulse 57  Temp(Src) 98.2 F (36.8 C)  Ht 5\' 1"  (1.549 m)  Wt 163 lb 12.8 oz (74.299 kg)  BMI 30.97 kg/m2  LMP 12/04/2008 General:   Alert and oriented. Pleasant and cooperative. Well-nourished and well-developed.  Head:  Normocephalic  and atraumatic. Eyes:  Without icterus, sclera clear and conjunctiva pink.  Ears:  Normal auditory acuity. Cardiovascular:  S1, S2 present without murmurs appreciated. Extremities without clubbing or edema. Respiratory:  Clear to auscultation bilaterally. No wheezes, rales, or rhonchi. No distress.  Gastrointestinal:  +BS, soft, non-tender and non-distended. No HSM noted. No guarding or rebound. No masses appreciated.  Rectal:  Deferred  Musculoskalatal:  Symmetrical without gross deformities. Neurologic:  Alert and oriented x4;  grossly normal neurologically. Psych:  Alert and cooperative. Normal mood and affect. Heme/Lymph/Immune: No excessive bruising noted.    12/10/2015 9:06 AM   Disclaimer: This note was dictated with voice recognition software. Similar sounding words can inadvertently be transcribed and may not be corrected upon review.

## 2015-12-10 NOTE — Progress Notes (Signed)
cc'ed to pcp °

## 2015-12-10 NOTE — Patient Instructions (Signed)
1. Taking Protonix. 2. Return for follow-up in 3 months. 3. At your next office visit we can discuss further other possible options for long-term use to control your acid reflux symptoms besides Protonix. 4. We can also discuss strategies to try to wean you off suppositories.

## 2015-12-10 NOTE — Assessment & Plan Note (Signed)
Denies constipation, but states she is taking suppositories for a number of years and if this point at least 50% of time requires a suppository to have a bowel movement. Stools are soft and do pass easily when she does have a bowel movement. 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 Discussed that initially it would likely be changing 1 medication for another. Decided to discuss further at her next office visit. Return for follow-up in 3 months.

## 2015-12-10 NOTE — Assessment & Plan Note (Signed)
Long-standing history of acid reflux, decided to take a break from Protonix about 3 or 4 months ago due to fears about thinning bones especially with her other medications and hormonal treatments for breast cancer history. She is scheduled for bone density scan in October. When she stopped her Protonix began having severe acid reflux recurrence. She was started back on Protonix when she saw her primary care and at this point has continued to take it and her symptoms are much improved. She is questioning whether she'll have to be on it forever. I recommended that she continue taking Protonix for now, return for follow-up in 3 months. At that point we can discuss trawling other options such as H2 blockers. On exam she is somewhat tender in her epigastric area likely due to 3-4 months of severe exacerbated GERD symptoms. If her symptoms return or regress can consider upper endoscopy for further evaluation.

## 2015-12-25 ENCOUNTER — Telehealth: Payer: Self-pay | Admitting: *Deleted

## 2015-12-25 NOTE — Telephone Encounter (Signed)
This RN was able to speak with pt per her concern of " knot on chest "  [name] [name] states lump is on her left side above area that she " had cancer before ".  Lump is about the size of a nickel and " hard and doesn't move "  Area has been present for over a month and is not getting larger but not " going away either "  She has also noticed more recently swelling in the upper arm with more focus in her arm pit.  She feels the area is warmer to touch then the right side.

## 2015-12-25 NOTE — Telephone Encounter (Signed)
This RN returned call to pt per her message stating she would like to be seen due to " a knot I have on my chest that has been there about a month "  Obtained identified VM- message left to return call to this RN to discuss her concern further and arrange appropriate follow up.

## 2015-12-26 ENCOUNTER — Other Ambulatory Visit: Payer: Self-pay | Admitting: *Deleted

## 2015-12-26 DIAGNOSIS — N63 Unspecified lump in unspecified breast: Secondary | ICD-10-CM

## 2015-12-26 DIAGNOSIS — C50919 Malignant neoplasm of unspecified site of unspecified female breast: Secondary | ICD-10-CM

## 2015-12-26 DIAGNOSIS — M79622 Pain in left upper arm: Secondary | ICD-10-CM

## 2015-12-26 DIAGNOSIS — C50412 Malignant neoplasm of upper-outer quadrant of left female breast: Secondary | ICD-10-CM

## 2015-12-26 NOTE — Progress Notes (Signed)
Per MD review of pt's concerns- request for films - mammo and U/S of left breast and axilla requested- order entered.  This RN called pt and obtained identified VM- message left per above per pt's request with prior call to leave message on her VM due to poor phone reception.

## 2016-01-01 ENCOUNTER — Other Ambulatory Visit: Payer: Self-pay | Admitting: Family Medicine

## 2016-01-02 ENCOUNTER — Other Ambulatory Visit: Payer: Self-pay | Admitting: *Deleted

## 2016-01-03 ENCOUNTER — Other Ambulatory Visit: Payer: Self-pay | Admitting: Family Medicine

## 2016-01-10 ENCOUNTER — Ambulatory Visit
Admission: RE | Admit: 2016-01-10 | Discharge: 2016-01-10 | Disposition: A | Discharge status: Home / Self Care | Payer: BLUE CROSS/BLUE SHIELD | Source: Ambulatory Visit | Attending: Oncology | Admitting: Oncology

## 2016-01-10 DIAGNOSIS — C50919 Malignant neoplasm of unspecified site of unspecified female breast: Secondary | ICD-10-CM

## 2016-01-10 DIAGNOSIS — C50412 Malignant neoplasm of upper-outer quadrant of left female breast: Secondary | ICD-10-CM

## 2016-01-10 DIAGNOSIS — M79622 Pain in left upper arm: Secondary | ICD-10-CM

## 2016-01-10 DIAGNOSIS — N63 Unspecified lump in unspecified breast: Secondary | ICD-10-CM

## 2016-01-12 ENCOUNTER — Other Ambulatory Visit: Payer: Self-pay | Admitting: Oncology

## 2016-01-12 DIAGNOSIS — C50412 Malignant neoplasm of upper-outer quadrant of left female breast: Secondary | ICD-10-CM

## 2016-01-17 ENCOUNTER — Telehealth: Payer: Self-pay

## 2016-01-17 NOTE — Telephone Encounter (Signed)
LMOVM - identified voice mail - Per Dr. Jana Hakim [place] was negative.  Dr. Jana Hakim wanted her to know if she was not comfortable with those results, he would be happy to see her early (August) to discuss them with her.  Pt to call the clniic if she has questions or wants to move her appts to Aug.

## 2016-02-07 DIAGNOSIS — Z0289 Encounter for other administrative examinations: Secondary | ICD-10-CM

## 2016-02-10 ENCOUNTER — Other Ambulatory Visit: Payer: Self-pay | Admitting: *Deleted

## 2016-02-10 ENCOUNTER — Telehealth: Payer: Self-pay | Admitting: Nurse Practitioner

## 2016-02-10 MED ORDER — ATENOLOL 50 MG PO TABS: 50.0000 mg | ORAL_TABLET | Freq: Every day | ORAL | 0 refills | Status: DC

## 2016-02-10 NOTE — Telephone Encounter (Signed)
atenolol (TENORMIN) 50 MG tablet  Pt needs at least 2 weeks sent in since her appt is not until the 17th of Aug please   [facility]

## 2016-02-10 NOTE — Telephone Encounter (Signed)
Refill sent to pharm. Pt notified on voicemail.  

## 2016-02-12 ENCOUNTER — Other Ambulatory Visit: Payer: Self-pay | Admitting: Family Medicine

## 2016-02-20 ENCOUNTER — Ambulatory Visit (INDEPENDENT_AMBULATORY_CARE_PROVIDER_SITE_OTHER): Payer: BLUE CROSS/BLUE SHIELD | Admitting: Nurse Practitioner

## 2016-02-20 ENCOUNTER — Encounter: Payer: Self-pay | Admitting: Nurse Practitioner

## 2016-02-20 VITALS — BP 116/82 | Ht 61.0 in | Wt 162.5 lb

## 2016-02-20 DIAGNOSIS — F32A Depression, unspecified: Secondary | ICD-10-CM

## 2016-02-20 DIAGNOSIS — I10 Essential (primary) hypertension: Secondary | ICD-10-CM | POA: Diagnosis not present

## 2016-02-20 DIAGNOSIS — F419 Anxiety disorder, unspecified: Secondary | ICD-10-CM

## 2016-02-20 DIAGNOSIS — R5383 Other fatigue: Secondary | ICD-10-CM

## 2016-02-20 DIAGNOSIS — E7800 Pure hypercholesterolemia, unspecified: Secondary | ICD-10-CM | POA: Diagnosis not present

## 2016-02-20 DIAGNOSIS — K219 Gastro-esophageal reflux disease without esophagitis: Secondary | ICD-10-CM | POA: Diagnosis not present

## 2016-02-20 DIAGNOSIS — F329 Major depressive disorder, single episode, unspecified: Secondary | ICD-10-CM | POA: Diagnosis not present

## 2016-02-20 MED ORDER — PRAVASTATIN SODIUM 40 MG PO TABS: 40.0000 mg | ORAL_TABLET | Freq: Every day | ORAL | 1 refills | Status: DC

## 2016-02-20 MED ORDER — ATENOLOL 50 MG PO TABS: 50.0000 mg | ORAL_TABLET | Freq: Two times a day (BID) | ORAL | 5 refills | Status: DC

## 2016-02-20 MED ORDER — VALACYCLOVIR HCL 1 G PO TABS: 1000.0000 mg | ORAL_TABLET | Freq: Every day | ORAL | 5 refills | Status: DC

## 2016-02-20 MED ORDER — PANTOPRAZOLE SODIUM 40 MG PO TBEC: 40.0000 mg | DELAYED_RELEASE_TABLET | Freq: Two times a day (BID) | ORAL | 2 refills | Status: DC

## 2016-02-20 MED ORDER — BUPROPION HCL ER (SR) 150 MG PO TB12: 150.0000 mg | ORAL_TABLET | Freq: Two times a day (BID) | ORAL | 1 refills | Status: DC

## 2016-02-20 MED ORDER — ESCITALOPRAM OXALATE 20 MG PO TABS: ORAL_TABLET | ORAL | 5 refills | Status: DC

## 2016-02-20 NOTE — Addendum Note (Signed)
Addended by: Nilda Simmer on: 02/20/2016 12:53 PM   Modules accepted: Orders

## 2016-02-20 NOTE — Progress Notes (Signed)
Subjective:  Presents for routine follow-up. Has had elevated blood pressure lately. For example at her headache specialist her systolic blood pressure runs 140-148. No chest pain/ischemic type pain or shortness of breath. Reflux overall under decent control, has breakthrough symptoms about once a week. Continues to struggle with anxiety and depression. Has lost her sister. Her daughter is struggling with mental illness. Has been trying to help take care of her grandchild. Has another sister recently diagnosed with breast cancer.  Objective:   BP 116/82   Ht 5\' 1"  (1.549 m)   Wt 162 lb 8 oz (73.7 kg)   LMP 12/04/2008   BMI 30.70 kg/m  NAD. Alert, oriented. Thoughts logical coherent and relevant. Dressed appropriately. Lungs clear. Heart regular rate rhythm. BP on recheck right arm sitting 162/92. Abdomen soft nondistended with distinct moderate epigastric area tenderness.  Assessment:  Problem List Items Addressed This Visit      Cardiovascular and Mediastinum   Essential hypertension - Primary   Relevant Medications   atenolol (TENORMIN) 50 MG tablet   pravastatin (PRAVACHOL) 40 MG tablet     Digestive   GERD (gastroesophageal reflux disease)   Relevant Medications   pantoprazole (PROTONIX) 40 MG tablet     Other   Anxiety   Relevant Medications   buPROPion (WELLBUTRIN SR) 150 MG 12 hr tablet   escitalopram (LEXAPRO) 20 MG tablet   Chronic depression   Relevant Medications   buPROPion (WELLBUTRIN SR) 150 MG 12 hr tablet   escitalopram (LEXAPRO) 20 MG tablet   Hypercholesteremia   Relevant Medications   atenolol (TENORMIN) 50 MG tablet   pravastatin (PRAVACHOL) 40 MG tablet   Other Relevant Orders   Lipid panel    Other Visit Diagnoses    Other fatigue       Relevant Orders   TSH   VITAMIN D 25 Hydroxy (Vit-D Deficiency, Fractures)     Plan:  Meds ordered this encounter  Medications  . atenolol (TENORMIN) 50 MG tablet    Sig: Take 1 tablet (50 mg total) by mouth 2  (two) times daily.    Dispense:  60 tablet    Refill:  5    30 day supply only. Needs office visit    Order Specific Question:   Supervising Provider    Answer:   Mikey Kirschner [[id number]]  . buPROPion (WELLBUTRIN SR) 150 MG 12 hr tablet    Sig: Take 1 tablet (150 mg total) by mouth 2 (two) times daily.    Dispense:  180 tablet    Refill:  1    Needs office visit    Order Specific Question:   Supervising Provider    Answer:   Mikey Kirschner [[id number]]  . escitalopram (LEXAPRO) 20 MG tablet    Sig: TAKE 1 TABLET BY MOUTH EVERY DAY    Dispense:  30 tablet    Refill:  5    Order Specific Question:   Supervising Provider    Answer:   Mikey Kirschner [[id number]]  . pantoprazole (PROTONIX) 40 MG tablet    Sig: Take 1 tablet (40 mg total) by mouth 2 (two) times daily.    Dispense:  60 tablet    Refill:  2    Order Specific Question:   Supervising Provider    Answer:   Mikey Kirschner [[id number]]  . pravastatin (PRAVACHOL) 40 MG tablet    Sig: Take 1 tablet (40 mg total) by mouth daily.    Dispense:  90  tablet    Refill:  1    Order Specific Question:   Supervising Provider    Answer:   Mikey Kirschner [[id number]]  . valACYclovir (VALTREX) 1000 MG tablet    Sig: Take 1 tablet (1,000 mg total) by mouth daily.    Dispense:  30 tablet    Refill:  5    Order Specific Question:   Supervising Provider    Answer:   Mikey Kirschner [[id number]]   Increase atenolol to one by mouth twice a day. Try to monitor BP outside the office if possible. Call back if any problems with her new dose. Increase Protonix to twice a day dosing for 1 month and if symptoms have not completely resolved including abdominal pain, patient to call back to the office for GI referral. Will also refer for mental health counseling. Continue other medications as directed. Lengthy discussion regarding the importance of patient getting out of her house with increased social activity. Return in about 3 months (around 05/22/2016) for  recheck.

## 2016-02-29 LAB — LIPID PANEL
Chol/HDL Ratio: 3 ratio units (ref 0.0–4.4)
Cholesterol, Total: 236 mg/dL — ABNORMAL HIGH (ref 100–199)
HDL: 79 mg/dL (ref >39)
LDL Calculated: 143 mg/dL — ABNORMAL HIGH (ref 0–99)
Triglycerides: 68 mg/dL (ref 0–149)
VLDL Cholesterol Cal: 14 mg/dL (ref 5–40)

## 2016-02-29 LAB — VITAMIN D 25 HYDROXY (VIT D DEFICIENCY, FRACTURES): Vit D, 25-Hydroxy: 64.2 ng/mL (ref 30.0–100.0)

## 2016-02-29 LAB — TSH: TSH: 1.12 u[IU]/mL (ref 0.450–4.500)

## 2016-03-03 ENCOUNTER — Encounter: Payer: Self-pay | Admitting: Family Medicine

## 2016-03-03 ENCOUNTER — Ambulatory Visit (INDEPENDENT_AMBULATORY_CARE_PROVIDER_SITE_OTHER): Payer: BLUE CROSS/BLUE SHIELD | Admitting: Family Medicine

## 2016-03-03 VITALS — BP 142/98 | Ht 60.0 in | Wt 164.0 lb

## 2016-03-03 DIAGNOSIS — E119 Type 2 diabetes mellitus without complications: Secondary | ICD-10-CM

## 2016-03-03 DIAGNOSIS — E162 Hypoglycemia, unspecified: Secondary | ICD-10-CM

## 2016-03-03 DIAGNOSIS — R739 Hyperglycemia, unspecified: Secondary | ICD-10-CM | POA: Diagnosis not present

## 2016-03-03 LAB — POCT GLYCOSYLATED HEMOGLOBIN (HGB A1C): Hemoglobin A1C: 6.6

## 2016-03-03 LAB — POCT GLUCOSE (DEVICE FOR HOME USE): Glucose Fasting, POC: 109 mg/dL — AB (ref 70–99)

## 2016-03-03 MED ORDER — BLOOD GLUCOSE MONITOR KIT: PACK | 0 refills | Status: DC

## 2016-03-03 NOTE — Progress Notes (Signed)
   Subjective:    Patient ID: [name] [name], female    DOB: [birth year], 55 y.o.   MRN: [id number]  HPILow blood sugar for about one week. Sugar readings around 50. Pt is not diabetic. Has been using her husbands test strips. Having extreme hunger, dizzy, feels like she will pass out off and on for a year but worse this past week.   pt has claimed low sugars the past week,  Pt claims when not eaiting sugar stays sup, but when eats, sugar drops   Blood sugar today 109. Pt has only had coffee with equal.  A1C today 6.6  Patient actually claims she has no low sugar problems if she doesn't eat all day. She states she only has low sugar problems if she eats.  In the past is had mild elevation sugars.  Patient claims her fasting sugars are often 50 or 60. She has been using her husband's glucometer.    Review of Systems    No headache, no major weight loss or weight gain, no chest pain no back pain abdominal pain no change in bowel habits complete ROS otherwise negative  Objective:   Physical Exam  Alert vitals stable, NAD. Blood pressure good on repeat. HEENT normal. Lungs clear. Heart regular rate and rhythm.       Assessment & Plan:  Impression hypoglycemia with several rather strange features to the history. On one hand patient states if she does not eat her sugars are fine. If she eats she has low sugar. She presents with worries regarding hypoglycemic, however her A1c refills early diabetes. In addition. Patient will go all day without eating which I've advised her will be a problem whether hypoglycemia or type 2 diabetes is her main consideration. Due to the puzzling nature of her presentation will refer to endocrinologist for management of her diabetes and hypoglycemia concerns glucometer prescribed

## 2016-03-06 ENCOUNTER — Encounter: Payer: Self-pay | Admitting: Family Medicine

## 2016-03-11 ENCOUNTER — Ambulatory Visit: Payer: BLUE CROSS/BLUE SHIELD | Admitting: Nurse Practitioner

## 2016-03-31 ENCOUNTER — Encounter: Payer: Self-pay | Admitting: Endocrinology

## 2016-03-31 ENCOUNTER — Ambulatory Visit (INDEPENDENT_AMBULATORY_CARE_PROVIDER_SITE_OTHER): Payer: BLUE CROSS/BLUE SHIELD | Admitting: Endocrinology

## 2016-03-31 DIAGNOSIS — E119 Type 2 diabetes mellitus without complications: Secondary | ICD-10-CM

## 2016-03-31 MED ORDER — METFORMIN HCL ER 500 MG PO TB24: 500.0000 mg | ORAL_TABLET | Freq: Every day | ORAL | 11 refills | Status: DC

## 2016-03-31 NOTE — Progress Notes (Signed)
Subjective:    Patient ID: [name] [name], female    DOB: June 10, 1961, 55 y.o.   MRN: [id number]  HPI pt states DM was dx'ed a few weeks ago; she has mild if any neuropathy of the lower extremities; she is unaware of any associated chronic complications; she has never been on medication for this; pt says her diet and exercise are "OK;"  she has never had GDM, pancreatitis, severe hypoglycemia or DKA. Pt says she has a few years of intermittent postprandial tremor and diaphoresis.  She has checked cbg during the episodes, and found it to be in the 50's.   Past Medical History:  Diagnosis Date  . Allergic rhinitis   . Anemia, unspecified 05/30/2013  . Anxiety   . Breast cancer ([place]) 03/31/13   left  . Cancer ([place])   . Chest pain 2009   Consultation-[facility], negative chest CT; nl echo in 2005; h/o palpitations  . Colitis 2010   not IBD  . Degenerative joint disease    + degenerative joint disease of the lumbosacral spine  . Depression   . Diabetes ([place])   . Dysrhythmia    palpations  . GERD (gastroesophageal reflux disease)    "a little"  . Headache   . Heart murmur    "small"  . Herpes simplex type II infection   . Hypercholesteremia    "slightly high"  . Hypertension    does not have high blood pressure  . 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 Palpitations   . Wears glasses     Past Surgical History:  Procedure Laterality Date  . ABDOMINAL HYSTERECTOMY  03/13/2007   TAH ?BSO--Dr Levin Bacon, [place]  . BREAST EXCISIONAL BIOPSY  04/2003   Left; benign disease  . BREAST LUMPECTOMY WITH NEEDLE LOCALIZATION AND AXILLARY LYMPH NODE DISSECTION Left 09/12/2013   Procedure: BREAST LUMPECTOMY WITH NEEDLE LOCALIZATION AND AXILLARY LYMPH NODE DISSECTION;  Surgeon: [name], MD;  Location: [facility];  Service: General;  Laterality: Left;  and axilla  . BREAST SURGERY    . COLONOSCOPY  10/2010   proctitis; melanosis coli  . EYE SURGERY Left    "fix lazy eye"  . PORTACATH  PLACEMENT Right 04/21/2013   Procedure: INSERTION PORT-A-CATH;  Surgeon: [name], MD;  Location[facility];  Service: General;  Laterality: Right;  . RIGHT OOPHORECTOMY  03/13/2007  . SUBOCCIPITAL CRANIECTOMY CERVICAL LAMINECTOMY N/A 05/10/2014   Procedure: SUBOCCIPITAL CRANIECTOMY CERVICAL LAMINECTOMY/DURAPLASTY;  Surgeon: [name], MD;  Location: [facility];  Service: Neurosurgery;  Laterality: N/A;  suboccipital craniectomy with cervical laminectomy and duraplasty  . TUBAL LIGATION      Social History   Social History  . Marital status: Married    Spouse name: N/A  . Number of children: 4  . Years of education: N/A   Occupational History  . Factory work [employer]   Social History Main Topics  . Smoking status: Former Smoker    Packs/day: 0.50    Years: 15.00    Types: Cigarettes    Quit date: 07/06/1982  . Smokeless tobacco: Never Used  . Alcohol use No  . Drug use: No  . Sexual activity: Yes    Partners: Male    Birth control/ protection: Surgical     Comment: TAH   Other Topics Concern  . Not on file   Social History Narrative  . No narrative on file    Current Outpatient Prescriptions on File Prior to Visit  Medication Sig Dispense Refill  . acetaminophen (TYLENOL) 500 MG tablet Take 500 mg by mouth every 6 (six) hours as needed for moderate pain.     Marland Kitchen ALPRAZolam (XANAX) 1 MG tablet TAKE 1 TABLET THREE TIMES A DAY AS NEEDED FOR ANXIETY 60 tablet 5  . anastrozole (ARIMIDEX) 1 MG tablet Take 1 tablet (1 mg total) by mouth daily. 90 tablet 3  . atenolol (TENORMIN) 50 MG tablet Take 1 tablet (50 mg total) by mouth 2 (two) times daily. 60 tablet 5  . blood glucose meter kit and supplies KIT Dispense based on patient and insurance preference. Use as directed by physician. Diabetes type 2. E11.9 1 each 0  . buPROPion (WELLBUTRIN SR) 150 MG 12 hr tablet Take 1 tablet (150 mg total) by mouth 2 (two) times daily. 180 tablet 1  . Cholecalciferol (VITAMIN  D PO) Take 1 tablet by mouth daily. Pt takes 1000 units daily.    . diphenhydramine-acetaminophen (TYLENOL PM) 25-500 MG TABS Take 2 tablets by mouth at bedtime.    Marland Kitchen escitalopram (LEXAPRO) 20 MG tablet TAKE 1 TABLET BY MOUTH EVERY DAY 30 tablet 5  . nabumetone (RELAFEN) 500 MG tablet Take 500 mg by mouth 2 (two) times daily.    . pantoprazole (PROTONIX) 40 MG tablet Take 1 tablet (40 mg total) by mouth 2 (two) times daily. 60 tablet 2  . pravastatin (PRAVACHOL) 40 MG tablet Take 1 tablet (40 mg total) by mouth daily. 90 tablet 1  . valACYclovir (VALTREX) 1000 MG tablet Take 1 tablet (1,000 mg total) by mouth daily. 30 tablet 5  . [DISCONTINUED] potassium chloride SA (K-DUR,KLOR-CON) 20 MEQ tablet Take 1 tablet (20 mEq total) by mouth 2 (two) times daily. 20 tablet 1   No current facility-administered medications on file prior to visit.     Allergies  Allergen Reactions  . Doxycycline Itching and Swelling    Family History  Problem Relation Age of Onset  . Aneurysm Mother     Cerebral  . Hypertension Mother   . Hyperlipidemia Mother   . Stroke Mother   . Coronary artery disease Father   . Hypertension Father   . Hyperlipidemia Father   . Lung cancer Brother   . Cancer Brother     Lung  . Colon cancer Neg Hx   . Diabetes Neg Hx     BP 132/84   Pulse 71   Ht 5' 1.5" (1.562 m)   Wt 165 lb (74.8 kg)   LMP 12/04/2008   SpO2 98%   BMI 30.67 kg/m    Review of Systems denies blurry vision, chest pain, sob, n/v, muscle cramps, dry skin, rhinorrhea, and easy bruising. He has intermittent headache.  She has gained a few lbs.  She has urinary frequency and cold intolerance.  Depression is well-controlled.       Objective:   Physical Exam VS: see vs page GEN: no distress HEAD: head: no deformity eyes: no periorbital swelling, no proptosis external nose and ears are normal mouth: no lesion seen NECK: supple, thyroid is not enlarged.  Old healed surgical scar at the posterior  neck CHEST WALL: no deformity LUNGS: clear to auscultation CV: reg rate and rhythm, no murmur ABD: abdomen is soft, nontender.  no hepatosplenomegaly.  not distended.  no hernia MUSCULOSKELETAL: muscle bulk and strength are grossly normal.  no obvious joint swelling.  gait is normal and steady EXTEMITIES: no deformity.  no ulcer on the feet.  feet are of  normal color and temp.  no edema.  There is bilateral onychomycosis of the toenails.   PULSES: dorsalis pedis intact bilat.  no carotid bruit NEURO:  cn 2-12 grossly intact.   readily moves all 4's.  sensation is intact to touch on the feet SKIN:  Normal texture and temperature.  No rash or suspicious lesion is visible.   NODES:  None palpable at the neck PSYCH: alert, well-oriented.  Does not appear anxious nor depressed.    Lab Results  Component Value Date   HGBA1C 6.6 03/03/2016   i personally reviewed electrocardiogram tracing (04/19/13): Indication: preop.  Impression: normal    Assessment & Plan:  Type 2 DM: she needs increased rx, if it can be done with a regimen that avoids or minimizes hypoglycemia.   Obesity: new to me Postprandial hypoglycemia, mild.  We discussed.  I told pt this is an indication of dysfunctional insulin secretion.  Insulin sensitizers can help this.

## 2016-03-31 NOTE — Patient Instructions (Addendum)
good diet and exercise significantly improve the control of your diabetes.  please let me know if you wish to be referred to a dietician.  high blood sugar is very risky to your health.  you should see an eye doctor and dentist every year.  It is very important to get all recommended vaccinations.  Controlling your blood pressure and cholesterol drastically reduces the damage diabetes does to your body.  Those who smoke should quit.  Please discuss these with your doctor.  check your blood sugar once a day.  vary the time of day when you check, between before the 3 meals, and at bedtime.  also check if you have symptoms of your blood sugar being too high or too low.  please keep a record of the readings and bring it to your next appointment here (or you can bring the meter itself).  You can write it on any piece of paper.  please call us sooner if your blood sugar goes below 70, or if you have a lot of readings over 200.   I have sent a prescription to your pharmacy, for metformin. Please consider having weight loss surgery.  It is good for your health.  Here is some information about it.  If you decide to consider further, please call the phone number in the papers, and register for a free informational meeting Please come back for a follow-up appointment in 1 month.  Please see our dietician the same day.

## 2016-04-03 ENCOUNTER — Encounter: Payer: Self-pay | Admitting: Family Medicine

## 2016-04-03 ENCOUNTER — Ambulatory Visit (INDEPENDENT_AMBULATORY_CARE_PROVIDER_SITE_OTHER): Payer: BLUE CROSS/BLUE SHIELD | Admitting: Family Medicine

## 2016-04-03 VITALS — BP 138/88 | Temp 98.7°F | Ht 61.0 in | Wt 163.0 lb

## 2016-04-03 DIAGNOSIS — N3000 Acute cystitis without hematuria: Secondary | ICD-10-CM

## 2016-04-03 DIAGNOSIS — R300 Dysuria: Secondary | ICD-10-CM

## 2016-04-03 LAB — POCT URINALYSIS DIPSTICK
Spec Grav, UA: 1.01
Urobilinogen, UA: 1
pH, UA: 6

## 2016-04-03 MED ORDER — CIPROFLOXACIN HCL 250 MG PO TABS: 250.0000 mg | ORAL_TABLET | Freq: Two times a day (BID) | ORAL | 0 refills | Status: DC

## 2016-04-03 NOTE — Progress Notes (Signed)
   Subjective:    Patient ID: [name] [name], female    DOB: 10-21-1960, 55 y.o.   MRN: [id number]  Urinary Tract Infection   This is a new problem. Episode onset: 5 days ago. Associated symptoms comments: Abdominal pain, dysuria. She has tried nothing for the symptoms.    Trying to incr fluid intake    Some nocturia  Pos incr ferwq uncy    possible low gr fever  Low gr in natur e   Patient recently started on metformin. Handling well. No obvious low sugar spells. Next  No vomiting no high fever or chills.  Diffuse mild discomfort low abdomen    Review of Systems No headache, no major weight loss or weight gain, no chest pain no back pain abdominal pain no change in bowel habits complete ROS otherwise negative     Objective:   Physical Exam  Alert vitals stable, NAD. Blood pressure good on repeat. HEENT normal. Lungs clear. Heart regular rate and rhythm. No CVA tenderness  Urinalysis 4-6 white blood cells per high-power field      Assessment & Plan:  Impression urinary tract infection plan Cipro twice a day 7 days. Symptom care discussed warning signs discussed

## 2016-04-09 ENCOUNTER — Other Ambulatory Visit: Payer: Self-pay | Admitting: Nurse Practitioner

## 2016-04-09 ENCOUNTER — Ambulatory Visit
Admission: RE | Admit: 2016-04-09 | Discharge: 2016-04-09 | Disposition: A | Discharge status: Home / Self Care | Payer: BLUE CROSS/BLUE SHIELD | Source: Ambulatory Visit | Attending: Oncology | Admitting: Oncology

## 2016-04-09 ENCOUNTER — Other Ambulatory Visit: Payer: Self-pay | Admitting: Oncology

## 2016-04-09 ENCOUNTER — Ambulatory Visit
Admission: RE | Admit: 2016-04-09 | Discharge: 2016-04-09 | Disposition: A | Discharge status: Home / Self Care | Payer: BLUE CROSS/BLUE SHIELD | Source: Ambulatory Visit | Attending: Nurse Practitioner | Admitting: Nurse Practitioner

## 2016-04-09 ENCOUNTER — Encounter: Payer: Self-pay | Admitting: Radiology

## 2016-04-09 DIAGNOSIS — C50412 Malignant neoplasm of upper-outer quadrant of left female breast: Secondary | ICD-10-CM

## 2016-04-09 DIAGNOSIS — E2839 Other primary ovarian failure: Secondary | ICD-10-CM

## 2016-04-09 DIAGNOSIS — R599 Enlarged lymph nodes, unspecified: Secondary | ICD-10-CM

## 2016-04-14 ENCOUNTER — Other Ambulatory Visit: Payer: Self-pay | Admitting: Oncology

## 2016-04-14 ENCOUNTER — Ambulatory Visit
Admission: RE | Admit: 2016-04-14 | Discharge: 2016-04-14 | Disposition: A | Discharge status: Home / Self Care | Payer: BLUE CROSS/BLUE SHIELD | Source: Ambulatory Visit | Attending: Oncology | Admitting: Oncology

## 2016-04-14 DIAGNOSIS — C50412 Malignant neoplasm of upper-outer quadrant of left female breast: Secondary | ICD-10-CM

## 2016-04-14 DIAGNOSIS — R599 Enlarged lymph nodes, unspecified: Secondary | ICD-10-CM

## 2016-04-15 ENCOUNTER — Emergency Department (HOSPITAL_COMMUNITY): Payer: BLUE CROSS/BLUE SHIELD

## 2016-04-15 ENCOUNTER — Encounter (HOSPITAL_COMMUNITY): Payer: Self-pay | Admitting: Emergency Medicine

## 2016-04-15 ENCOUNTER — Emergency Department (HOSPITAL_COMMUNITY)
Admission: EM | Admit: 2016-04-15 | Discharge: 2016-04-15 | Disposition: A | Discharge status: Home / Self Care | Payer: BLUE CROSS/BLUE SHIELD | Attending: Emergency Medicine | Admitting: Emergency Medicine

## 2016-04-15 DIAGNOSIS — Z87891 Personal history of nicotine dependence: Secondary | ICD-10-CM | POA: Diagnosis not present

## 2016-04-15 DIAGNOSIS — R1084 Generalized abdominal pain: Secondary | ICD-10-CM | POA: Diagnosis present

## 2016-04-15 DIAGNOSIS — R630 Anorexia: Secondary | ICD-10-CM | POA: Insufficient documentation

## 2016-04-15 DIAGNOSIS — Z79899 Other long term (current) drug therapy: Secondary | ICD-10-CM | POA: Insufficient documentation

## 2016-04-15 DIAGNOSIS — Z853 Personal history of malignant neoplasm of breast: Secondary | ICD-10-CM | POA: Diagnosis not present

## 2016-04-15 DIAGNOSIS — R110 Nausea: Secondary | ICD-10-CM | POA: Insufficient documentation

## 2016-04-15 DIAGNOSIS — Z7984 Long term (current) use of oral hypoglycemic drugs: Secondary | ICD-10-CM | POA: Insufficient documentation

## 2016-04-15 DIAGNOSIS — I10 Essential (primary) hypertension: Secondary | ICD-10-CM | POA: Diagnosis not present

## 2016-04-15 DIAGNOSIS — E119 Type 2 diabetes mellitus without complications: Secondary | ICD-10-CM | POA: Diagnosis not present

## 2016-04-15 LAB — CBG MONITORING, ED: Glucose-Capillary: 75 mg/dL (ref 65–99)

## 2016-04-15 LAB — COMPREHENSIVE METABOLIC PANEL
ALT: 19 U/L (ref 14–54)
AST: 17 U/L (ref 15–41)
Albumin: 4.1 g/dL (ref 3.5–5.0)
Alkaline Phosphatase: 50 U/L (ref 38–126)
Anion gap: 8 (ref 5–15)
BUN: 12 mg/dL (ref 6–20)
CO2: 30 mmol/L (ref 22–32)
Calcium: 9.4 mg/dL (ref 8.9–10.3)
Chloride: 103 mmol/L (ref 101–111)
Creatinine, Ser: 0.91 mg/dL (ref 0.44–1.00)
GFR calc Af Amer: >60 mL/min (ref >60)
GFR calc non Af Amer: >60 mL/min (ref >60)
Glucose, Bld: 82 mg/dL (ref 65–99)
Potassium: 3.6 mmol/L (ref 3.5–5.1)
Sodium: 141 mmol/L (ref 135–145)
Total Bilirubin: 0.7 mg/dL (ref 0.3–1.2)
Total Protein: 7.6 g/dL (ref 6.5–8.1)

## 2016-04-15 LAB — URINALYSIS, ROUTINE W REFLEX MICROSCOPIC
Bilirubin Urine: NEGATIVE
Glucose, UA: NEGATIVE mg/dL
Ketones, ur: 15 mg/dL — AB
Nitrite: NEGATIVE
Protein, ur: NEGATIVE mg/dL
Specific Gravity, Urine: 1.025 (ref 1.005–1.030)
pH: 5.5 (ref 5.0–8.0)

## 2016-04-15 LAB — LIPASE, BLOOD: Lipase: 15 U/L (ref 11–51)

## 2016-04-15 LAB — CBC
HCT: 38.2 % (ref 36.0–46.0)
Hemoglobin: 12.7 g/dL (ref 12.0–15.0)
MCH: 28.7 pg (ref 26.0–34.0)
MCHC: 33.2 g/dL (ref 30.0–36.0)
MCV: 86.2 fL (ref 78.0–100.0)
Platelets: 178 10*3/uL (ref 150–400)
RBC: 4.43 MIL/uL (ref 3.87–5.11)
RDW: 14 % (ref 11.5–15.5)
WBC: 7.7 10*3/uL (ref 4.0–10.5)

## 2016-04-15 LAB — URINE MICROSCOPIC-ADD ON

## 2016-04-15 MED ORDER — IOPAMIDOL (ISOVUE-300) INJECTION 61%
100.0000 mL | Freq: Once | INTRAVENOUS | Status: AC | PRN
  Administered 2016-04-15: 100 mL via INTRAVENOUS

## 2016-04-15 MED ORDER — KETOROLAC TROMETHAMINE 30 MG/ML IJ SOLN
30.0000 mg | Freq: Once | INTRAMUSCULAR | Status: AC
  Administered 2016-04-15: 30 mg via INTRAVENOUS
  Filled 2016-04-15: qty 1

## 2016-04-15 MED ORDER — ONDANSETRON HCL 4 MG/2ML IJ SOLN
4.0000 mg | Freq: Once | INTRAMUSCULAR | Status: AC
  Administered 2016-04-15: 4 mg via INTRAVENOUS
  Filled 2016-04-15: qty 2

## 2016-04-15 MED ORDER — SODIUM CHLORIDE 0.9 % IV BOLUS (SEPSIS)
1000.0000 mL | Freq: Once | INTRAVENOUS | Status: AC
  Administered 2016-04-15: 1000 mL via INTRAVENOUS

## 2016-04-15 MED ORDER — IOPAMIDOL (ISOVUE-300) INJECTION 61%
INTRAVENOUS | Status: AC
  Filled 2016-04-15: qty 30

## 2016-04-15 MED ORDER — METOCLOPRAMIDE HCL 10 MG PO TABS: ORAL_TABLET | ORAL | 0 refills | Status: DC

## 2016-04-15 NOTE — ED Notes (Signed)
Pt finished contrast. Ct aware. nad

## 2016-04-15 NOTE — ED Notes (Signed)
Pt just returned from xray 

## 2016-04-15 NOTE — Discharge Instructions (Signed)
Drink plenty of fluids and follow-up with family doctor next week for recheck

## 2016-04-15 NOTE — ED Triage Notes (Signed)
Pt reports generalized abd pain x2 weeks but reports increase in pain and nausea x2 days. nad noted.

## 2016-04-15 NOTE — ED Provider Notes (Signed)
[facility] DEPT Provider Note   CSN: [id number] Arrival date & time: 04/15/16  1259     History   Chief Complaint Chief Complaint  Patient presents with  . Abdominal Pain    HPI [name] [name] is a 55 y.o. female.  The patient complains of nausea for a couple weeks no other symptoms   The history is provided by the patient. No language interpreter was used.  Abdominal Pain   This is a new problem. The current episode started more than 1 week ago. The problem occurs constantly. The problem has not changed since onset.The pain is associated with an unknown factor. The quality of the pain is aching. The pain is at a severity of 1/10. Associated symptoms include anorexia and nausea. Pertinent negatives include diarrhea, frequency, hematuria and headaches. Nothing aggravates the symptoms. Nothing relieves the symptoms.    Past Medical History:  Diagnosis Date  . Allergic rhinitis   . Anemia, unspecified 05/30/2013  . Anxiety   . Breast cancer ([place]) 03/31/13   left  . Cancer ([place])   . Chest pain 2009   Consultation-[place], negative chest CT; nl echo in 2005; h/o palpitations  . Colitis 2010   not IBD  . Degenerative joint disease    + degenerative joint disease of the lumbosacral spine  . Depression   . Diabetes ([place])   . Diabetes ([place])   . Dysrhythmia    palpations  . GERD (gastroesophageal reflux disease)    "a little"  . Headache   . Heart murmur    "small"  . Herpes simplex type II infection   . Hypercholesteremia    "slightly high"  . Hypertension    does not have high blood pressure  . 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 Palpitations   . Wears glasses     Patient Active Problem List   Diagnosis Date Noted  . Diabetes ([place])   . Constipation 12/10/2015  . Depression 08/23/2014  . Anxiety 08/23/2014  . Heart murmur 08/23/2014  . Hypercholesteremia 08/23/2014  . History of Chiari malformation 08/23/2014  . 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 Fever   . Vaginal  dryness 07/16/2014  . 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 Joint pain 10/02/2013  . Neuropathic pain 10/02/2013  . Onychomycosis of toenail 10/02/2013  . Frequent headaches 06/19/2013  . Anxiety as acute reaction to exceptional stress 05/22/2013  . Breast cancer of upper-outer quadrant of left female breast ([place]) 04/06/2013  . Breast cancer ([place]) 03/31/2013  . 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 Obesity (BMI 30-39.9) 12/14/2012  . GERD (gastroesophageal reflux disease) 12/14/2012  . Chronic depression 12/14/2012  . Heart murmur, systolic 16/24/4695  . Essential hypertension 09/02/2012  . Chest pain   . Palpitations   . DEGENERATIVE JOINT DISEASE, RIGHT KNEE 10/03/2009  . [place] DEGENERATION 10/24/2007    Past Surgical History:  Procedure Laterality Date  . ABDOMINAL HYSTERECTOMY  03/13/2007   TAH ?BSO--Dr Levin Bacon, [place]  . BREAST EXCISIONAL BIOPSY  04/2003   Left; benign disease  . BREAST LUMPECTOMY WITH NEEDLE LOCALIZATION AND AXILLARY LYMPH NODE DISSECTION Left 09/12/2013   Procedure: BREAST LUMPECTOMY WITH NEEDLE LOCALIZATION AND AXILLARY LYMPH NODE DISSECTION;  Surgeon: [name], MD;  Location: [facility];  Service: General;  Laterality: Left;  and axilla  . BREAST SURGERY    . COLONOSCOPY  10/2010   proctitis; melanosis coli  . EYE SURGERY Left    "fix lazy eye"  . PORTACATH PLACEMENT Right 04/21/2013  Procedure: INSERTION PORT-A-CATH;  Surgeon: [name], MD;  Location: [facility];  Service: General;  Laterality: Right;  . RIGHT OOPHORECTOMY  03/13/2007  . SUBOCCIPITAL CRANIECTOMY CERVICAL LAMINECTOMY N/A 05/10/2014   Procedure: SUBOCCIPITAL CRANIECTOMY CERVICAL LAMINECTOMY/DURAPLASTY;  Surgeon: [name], MD;  Location: [facility];  Service: Neurosurgery;  Laterality: N/A;  suboccipital craniectomy with cervical laminectomy and duraplasty  . TUBAL LIGATION      OB History    Gravida Para Term Preterm AB Living   '4 4 4      4   ' SAB TAB Ectopic Multiple Live Births           4       Home Medications    Prior to Admission medications   Medication Sig Start Date End Date Taking? Authorizing Provider  acetaminophen (TYLENOL) 500 MG tablet Take 500 mg by mouth every 6 (six) hours as needed for moderate pain.    Yes Historical Provider, MD  ALPRAZolam Duanne Moron) 1 MG tablet TAKE 1 TABLET THREE TIMES A DAY AS NEEDED FOR ANXIETY 09/06/15  Yes Nilda Simmer, NP  anastrozole (ARIMIDEX) 1 MG tablet Take 1 tablet (1 mg total) by mouth daily. 05/06/15  Yes Chauncey Cruel, MD  atenolol (TENORMIN) 50 MG tablet Take 1 tablet (50 mg total) by mouth 2 (two) times daily. 02/20/16  Yes Nilda Simmer, NP  buPROPion (WELLBUTRIN SR) 150 MG 12 hr tablet Take 1 tablet (150 mg total) by mouth 2 (two) times daily. 02/20/16  Yes Nilda Simmer, NP  Cholecalciferol (VITAMIN D PO) Take 1 tablet by mouth daily. Pt takes 1000 units daily.   Yes Historical Provider, MD  diphenhydramine-acetaminophen (TYLENOL PM) 25-500 MG TABS Take 2 tablets by mouth at bedtime.   Yes Historical Provider, MD  escitalopram (LEXAPRO) 20 MG tablet TAKE 1 TABLET BY MOUTH EVERY DAY Patient taking differently: Take 20 mg by mouth daily.  02/20/16  Yes Nilda Simmer, NP  metFORMIN (GLUCOPHAGE-XR) 500 MG 24 hr tablet Take 1 tablet (500 mg total) by mouth daily with breakfast. 03/31/16  Yes Renato Shin, MD  nabumetone (RELAFEN) 500 MG tablet Take 500 mg by mouth 2 (two) times daily.   Yes Historical Provider, MD  pantoprazole (PROTONIX) 40 MG tablet Take 1 tablet (40 mg total) by mouth 2 (two) times daily. 02/20/16  Yes Nilda Simmer, NP  pravastatin (PRAVACHOL) 40 MG tablet Take 1 tablet (40 mg total) by mouth daily. 02/20/16  Yes Nilda Simmer, NP  valACYclovir (VALTREX) 1000 MG tablet Take 1 tablet (1,000 mg total) by mouth daily. 02/20/16  Yes Nilda Simmer, NP  blood glucose meter kit and supplies KIT Dispense based on patient and insurance  preference. Use as directed by physician. Diabetes type 2. E11.9 03/03/16   Mikey Kirschner, MD  ciprofloxacin (CIPRO) 250 MG tablet Take 1 tablet (250 mg total) by mouth 2 (two) times daily. Patient not taking: Reported on 04/15/2016 04/03/16   Mikey Kirschner, MD  metoCLOPramide (REGLAN) 10 MG tablet Take one every 6 hours for nauseau 04/15/16   Milton Ferguson, MD    Family History Family History  Problem Relation Age of Onset  . Aneurysm Mother     Cerebral  . Hypertension Mother   . Hyperlipidemia Mother   . Stroke Mother   . Coronary artery disease Father   . Hypertension Father   . Hyperlipidemia Father   . Lung cancer Brother   . Cancer Brother  Lung  . Colon cancer Neg Hx   . Diabetes Neg Hx     Social History Social History  Substance Use Topics  . Smoking status: Former Smoker    Packs/day: 0.50    Years: 15.00    Types: Cigarettes    Quit date: 07/06/1982  . Smokeless tobacco: Never Used  . Alcohol use No     Allergies   Doxycycline   Review of Systems Review of Systems  Constitutional: Negative for appetite change and fatigue.  HENT: Negative for congestion, ear discharge and sinus pressure.   Eyes: Negative for discharge.  Respiratory: Negative for cough.   Cardiovascular: Negative for chest pain.  Gastrointestinal: Positive for abdominal pain, anorexia and nausea. Negative for diarrhea.  Genitourinary: Negative for frequency and hematuria.  Musculoskeletal: Negative for back pain.  Skin: Negative for rash.  Neurological: Negative for seizures and headaches.  Psychiatric/Behavioral: Negative for hallucinations.     Physical Exam Updated Vital Signs BP 120/77 (BP Location: Right Arm)   Pulse 66   Temp 98.2 F (36.8 C) (Oral)   Resp 20   Ht '5\' 1"'  (1.549 m)   Wt 162 lb (73.5 kg)   LMP 12/04/2008   SpO2 100%   BMI 30.61 kg/m   Physical Exam  Constitutional: She is oriented to person, place, and time. She appears well-developed.  HENT:    Head: Normocephalic.  Eyes: Conjunctivae and EOM are normal. No scleral icterus.  Neck: Neck supple. No thyromegaly present.  Cardiovascular: Normal rate and regular rhythm.  Exam reveals no gallop and no friction rub.   No murmur heard. Pulmonary/Chest: No stridor. She has no wheezes. She has no rales. She exhibits no tenderness.  Abdominal: She exhibits no distension. There is no tenderness. There is no rebound.  Musculoskeletal: Normal range of motion. She exhibits no edema.  Lymphadenopathy:    She has no cervical adenopathy.  Neurological: She is oriented to person, place, and time. She exhibits normal muscle tone. Coordination normal.  Skin: No rash noted. No erythema.  Psychiatric: She has a normal mood and affect. Her behavior is normal.     ED Treatments / Results  Labs (all labs ordered are listed, but only abnormal results are displayed) Labs Reviewed  URINALYSIS, ROUTINE W REFLEX MICROSCOPIC (NOT AT [facility]) - Abnormal; Notable for the following:       Result Value   Hgb urine dipstick TRACE (*)    Ketones, ur 15 (*)    Leukocytes, UA SMALL (*)    All other components within normal limits  URINE MICROSCOPIC-ADD ON - Abnormal; Notable for the following:    Squamous Epithelial / LPF 6-30 (*)    Bacteria, UA FEW (*)    All other components within normal limits  LIPASE, BLOOD  COMPREHENSIVE METABOLIC PANEL  CBC  CBG MONITORING, ED    EKG  EKG Interpretation None       Radiology Ct Abdomen Pelvis W Contrast  Result Date: 04/15/2016 CLINICAL DATA:  Generalized abdominal pain for 2 weeks. EXAM: CT ABDOMEN AND PELVIS WITH CONTRAST TECHNIQUE: Multidetector CT imaging of the abdomen and pelvis was performed using the standard protocol following bolus administration of intravenous contrast. CONTRAST:  175m ISOVUE-300 IOPAMIDOL (ISOVUE-300) INJECTION 61% COMPARISON:  PET-CT 04/26/2013 FINDINGS: Lower chest:  Negative Hepatobiliary: No focal liver abnormality.No evidence  of biliary obstruction or stone. Pancreas: Unremarkable. Spleen: Unremarkable. Adrenals/Urinary Tract: Negative adrenals. No hydronephrosis or stone. Unremarkable bladder. Stomach/Bowel: Formed stool throughout the colon may reflect constipation.  No obstruction. No appendicitis. Vascular/Lymphatic: Atherosclerotic calcification of the aorta and iliacs. No acute vascular abnormality. No mass or adenopathy. Reproductive:Hysterectomy and possible right oophorectomy. Stable appearance of the left ovary compared to 2014. Other: No ascites or pneumoperitoneum. Musculoskeletal: No acute finding. T12 left body and pedicle sclerosis is benign given stability since 2014 when there was no hypermetabolism. IMPRESSION: 1. No acute finding. 2. Diffuse formed stool, possible constipation. Electronically Signed   By: Monte Fantasia M.D.   On: 04/15/2016 17:58   Dg Abd Acute W/chest  Result Date: 04/15/2016 CLINICAL DATA:  Abdominal pain for 2 weeks. Nausea for 2 days. History of breast carcinoma EXAM: DG ABDOMEN ACUTE W/ 1V CHEST COMPARISON:  Chest radiograph August 22, 2014 FINDINGS: PA chest: There is no edema or consolidation. Heart size and pulmonary vascularity are normal. No adenopathy. There are surgical clips in left axillary region. Supine and upright abdomen: There is moderate stool throughout the colon. There is no bowel dilatation or air-fluid levels suggesting bowel obstruction. No free air. There are phleboliths in the pelvis. IMPRESSION: Moderate stool in colon. No bowel obstruction or free air. No lung edema or consolidation. Electronically Signed   By: Lowella Grip III M.D.   On: 04/15/2016 15:47   [place] Rt Breast Bx W Loc Dev 1st Lesion Img Bx Spec US Guide  Result Date: 04/14/2016 CLINICAL DATA:  Right axillary lymph node with a borderline cortex. EXAM: ULTRASOUND GUIDED CORE NEEDLE BIOPSY OF A RIGHT AXILLARY NODE COMPARISON:  Previous exam(s). FINDINGS: I met with the patient and we discussed the  procedure of ultrasound-guided biopsy, including benefits and alternatives. We discussed the high likelihood of a successful procedure. We discussed the risks of the procedure, including infection, bleeding, tissue injury, clip migration, and inadequate sampling. Informed written consent was given. The usual time-out protocol was performed immediately prior to the procedure. Using sterile technique and 1% Lidocaine as local anesthetic, under direct ultrasound visualization, a 14 gauge spring-loaded device was used to perform biopsy of a borderline lymph node in the right axilla using a lateral approach. At the conclusion of the procedure a HydroMARK tissue marker clip was deployed into the biopsy cavity. Follow up 2 view mammogram was performed and dictated separately. IMPRESSION: Ultrasound guided biopsy of a borderline right axillary lymph node. No apparent complications. Electronically Signed   By: Dorise Bullion III M.D   On: 04/14/2016 14:31    Procedures Procedures (including critical care time)  Medications Ordered in ED Medications  iopamidol (ISOVUE-300) 61 % injection (not administered)  sodium chloride 0.9 % bolus 1,000 mL (1,000 mLs Intravenous New Bag/Given 04/15/16 1603)  ondansetron (ZOFRAN) injection 4 mg (4 mg Intravenous Given 04/15/16 1603)  ketorolac (TORADOL) 30 MG/ML injection 30 mg (30 mg Intravenous Given 04/15/16 1603)  iopamidol (ISOVUE-300) 61 % injection 100 mL (100 mLs Intravenous Contrast Given 04/15/16 1740)     Initial Impression / Assessment and Plan / ED Course  I have reviewed the triage vital signs and the nursing notes.  Pertinent labs & imaging results that were available during my care of the patient were reviewed by me and considered in my medical decision making (see chart for details).  Clinical Course    Labs unremarkable. CT scan shows possible constipation. Patient is sent home with Reglan for nausea told to increase fluid intake,  Follow up with  pcp next week  Final Clinical Impressions(s) / ED Diagnoses   Final diagnoses:  Nausea    New Prescriptions New Prescriptions  METOCLOPRAMIDE (REGLAN) 10 MG TABLET    Take one every 6 hours for nauseau     Milton Ferguson, MD 04/15/16 2151

## 2016-04-16 ENCOUNTER — Telehealth: Payer: Self-pay | Admitting: Family Medicine

## 2016-04-16 NOTE — Telephone Encounter (Signed)
See surgical pathology report in blue folder in office.

## 2016-04-21 ENCOUNTER — Encounter: Payer: Self-pay | Admitting: Nutrition

## 2016-04-21 ENCOUNTER — Encounter: Payer: BLUE CROSS/BLUE SHIELD | Attending: Endocrinology | Admitting: Nutrition

## 2016-04-21 VITALS — Ht 60.0 in | Wt 164.0 lb

## 2016-04-21 DIAGNOSIS — E119 Type 2 diabetes mellitus without complications: Secondary | ICD-10-CM | POA: Diagnosis present

## 2016-04-21 DIAGNOSIS — IMO0002 Reserved for concepts with insufficient information to code with codable children: Secondary | ICD-10-CM

## 2016-04-21 DIAGNOSIS — E1165 Type 2 diabetes mellitus with hyperglycemia: Secondary | ICD-10-CM

## 2016-04-21 DIAGNOSIS — E118 Type 2 diabetes mellitus with unspecified complications: Secondary | ICD-10-CM

## 2016-04-21 NOTE — Patient Instructions (Signed)
Goals 1. Follow My Plate Method 2. Eat three meals per day eating meals on time 3. Walk/exercise 30 minutes three times per week 4. Increase fresh fruits and low carb vegetables. 5. Lose 1 lbs per week.

## 2016-04-22 ENCOUNTER — Other Ambulatory Visit (HOSPITAL_BASED_OUTPATIENT_CLINIC_OR_DEPARTMENT_OTHER): Payer: BLUE CROSS/BLUE SHIELD

## 2016-04-22 ENCOUNTER — Ambulatory Visit (HOSPITAL_BASED_OUTPATIENT_CLINIC_OR_DEPARTMENT_OTHER): Payer: BLUE CROSS/BLUE SHIELD | Admitting: Oncology

## 2016-04-22 VITALS — BP 137/64 | HR 48 | Temp 98.2°F | Resp 16 | Ht 60.0 in | Wt 163.9 lb

## 2016-04-22 DIAGNOSIS — C50412 Malignant neoplasm of upper-outer quadrant of left female breast: Secondary | ICD-10-CM

## 2016-04-22 DIAGNOSIS — N898 Other specified noninflammatory disorders of vagina: Secondary | ICD-10-CM

## 2016-04-22 DIAGNOSIS — F329 Major depressive disorder, single episode, unspecified: Secondary | ICD-10-CM

## 2016-04-22 DIAGNOSIS — M25552 Pain in left hip: Secondary | ICD-10-CM | POA: Diagnosis not present

## 2016-04-22 DIAGNOSIS — Z79811 Long term (current) use of aromatase inhibitors: Secondary | ICD-10-CM

## 2016-04-22 DIAGNOSIS — Z170 Estrogen receptor positive status [ER+]: Secondary | ICD-10-CM

## 2016-04-22 LAB — CBC WITH DIFFERENTIAL/PLATELET
BASO%: 0.1 % (ref 0.0–2.0)
Basophils Absolute: 0 10*3/uL (ref 0.0–0.1)
EOS%: 1.1 % (ref 0.0–7.0)
Eosinophils Absolute: 0.1 10*3/uL (ref 0.0–0.5)
HCT: 37.7 % (ref 34.8–46.6)
HGB: 12.7 g/dL (ref 11.6–15.9)
LYMPH%: 23.2 % (ref 14.0–49.7)
MCH: 28.8 pg (ref 25.1–34.0)
MCHC: 33.7 g/dL (ref 31.5–36.0)
MCV: 85.5 fL (ref 79.5–101.0)
MONO#: 0.5 10*3/uL (ref 0.1–0.9)
MONO%: 6.6 % (ref 0.0–14.0)
NEUT#: 4.8 10*3/uL (ref 1.5–6.5)
NEUT%: 69 % (ref 38.4–76.8)
Platelets: 190 10*3/uL (ref 145–400)
RBC: 4.41 10*6/uL (ref 3.70–5.45)
RDW: 14.5 % (ref 11.2–14.5)
WBC: 7 10*3/uL (ref 3.9–10.3)
lymph#: 1.6 10*3/uL (ref 0.9–3.3)

## 2016-04-22 LAB — COMPREHENSIVE METABOLIC PANEL
ALT: 26 U/L (ref 0–55)
AST: 23 U/L (ref 5–34)
Albumin: 3.9 g/dL (ref 3.5–5.0)
Alkaline Phosphatase: 60 U/L (ref 40–150)
Anion Gap: 8 mEq/L (ref 3–11)
BUN: 11.7 mg/dL (ref 7.0–26.0)
CO2: 29 mEq/L (ref 22–29)
Calcium: 9.8 mg/dL (ref 8.4–10.4)
Chloride: 105 mEq/L (ref 98–109)
Creatinine: 1 mg/dL (ref 0.6–1.1)
EGFR: 78 mL/min/{1.73_m2} — ABNORMAL LOW (ref >90)
Glucose: 93 mg/dl (ref 70–140)
Potassium: 4 mEq/L (ref 3.5–5.1)
Sodium: 143 mEq/L (ref 136–145)
Total Bilirubin: 0.42 mg/dL (ref 0.20–1.20)
Total Protein: 7.5 g/dL (ref 6.4–8.3)

## 2016-04-22 MED ORDER — TAMOXIFEN CITRATE 20 MG PO TABS: 20.0000 mg | ORAL_TABLET | Freq: Every day | ORAL | 12 refills | Status: AC

## 2016-04-22 MED ORDER — KETOCONAZOLE 2 % EX CREA: 1.0000 "application " | TOPICAL_CREAM | Freq: Every day | CUTANEOUS | 0 refills | Status: DC

## 2016-04-22 NOTE — Progress Notes (Signed)
ID: [name] [name] OB: 02/10/1961  MR#: [id number]  OYD#:[id number]  PCP: Mickie Hillier, MD ; Pearson Forster, NP GYN:   Josefa Half, MD  SU: Alphonsa Overall, MD [place]:  Thea Silversmith, MD OTHER MD: Freida Busman, MD;  Neysa Bonito, MD, Sharman Crate MD  CHIEF COMPLAINT:  Left Breast Cancer  CURRENT TREATMENT: Tamoxifen  BREAST CANCER HISTORY: From the original intake note 04/12/2013:  [name] [name] palpated a mass in her left breast in late August 2014. She was already scheduled for screening mammography at [facility] and this was performed 03/13/2013. Indeed a possible mass was noted in the left breast and on 03/29/2013 the patient underwent diagnostic left mammography and left ultrasonography. This showed a 2.5 cm irregular mass in the upper outer quadrant of the left breast. This was palpable.by ultrasound there was a 1.6 cm irregular hypoechoic mass in the area in question, and in addition to enlarged left axillary lymph nodes were noted, the largest measuring 2.5 cm.  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  Bilateral breast MRI 04/11/2013 shows 3 abnormal enhancing masses in the upper outer quadrant of the left breast measuring altogether 6.4 cm. The largest separate mass measured 2.7 cm. There were abnormal or large lymph nodes in the left axilla. Ultrasound guided biopsy of the 2 smaller masses has been scheduled.  The patient's subsequent history is as detailed below   INTERVAL HISTORY: [name] [name] returns today for follow up of her left-sided  breast cancer. She continues on anastrozole, generally with good tolerance.  She never developed the  arthralgias or myalgias that many patients can experience on this medication. She obtains it at a good price.  She had bilateral diagnostic mammography with tomography at the [facility] 04/09/2016 which showed a prominent right axillary lymph node. This was biopsied 04/14/2016 and was benign.  She also had a bone density 04/09/2016 which showed a T score of -1.7. This was decreased as compared to the prior exam.  REVIEW OF SYSTEMS: She continues to complain of skin infection in the area under her right breast. This has been variously treated. She tells me her sister has now been diagnosed with breast cancer and is followed by Dr. Burr Medico in this office. I don't believe this qualifies the patient for genetic testing but will check. [name] [name] continues to be depressed. There is a very difficult home situation which does not look like it will improve. She has pain in her leg, palpitations, abdominal pain, nausea, vomiting, headaches, arthritic pains, and poorly controlled diabetes. She is not employed. She is not exercising regularly. Vaginal dryness is a major concern A detailed review of systems today was otherwise stable   PAST MEDICAL HISTORY: Past Medical History:  Diagnosis Date  . Allergic rhinitis   . Anemia, unspecified 05/30/2013  . Anxiety   . Breast cancer ([place]) 03/31/13   left  . Cancer ([place])   . Chest pain 2009   Consultation-[place], negative chest CT; nl echo in 2005; h/o palpitations  . Colitis 2010   not IBD  . Degenerative joint disease    + degenerative joint disease of the lumbosacral spine  . Depression   .  Diabetes ([place])   . Diabetes ([place])   . Dysrhythmia    palpations  . GERD (gastroesophageal reflux disease)    "a little"  . Headache   . Heart murmur    "small"  . Herpes simplex type II infection   . Hypercholesteremia    "slightly high"  . Hypertension    does not have high blood pressure  . 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 Palpitations   . Wears glasses     PAST  SURGICAL HISTORY: Past Surgical History:  Procedure Laterality Date  . ABDOMINAL HYSTERECTOMY  03/13/2007   TAH ?BSO--Dr Levin Bacon, [place]  . BREAST EXCISIONAL BIOPSY  04/2003   Left; benign disease  . BREAST LUMPECTOMY WITH NEEDLE LOCALIZATION AND AXILLARY LYMPH NODE DISSECTION Left 09/12/2013   Procedure: BREAST LUMPECTOMY WITH NEEDLE LOCALIZATION AND AXILLARY LYMPH NODE DISSECTION;  Surgeon: [name], MD;  Location: [facility];  Service: General;  Laterality: Left;  and axilla  . BREAST SURGERY    . COLONOSCOPY  10/2010   proctitis; melanosis coli  . EYE SURGERY Left    "fix lazy eye"  . PORTACATH PLACEMENT Right 04/21/2013   Procedure: INSERTION PORT-A-CATH;  Surgeon: [name], MD;  Location: [facility];  Service: General;  Laterality: Right;  . RIGHT OOPHORECTOMY  03/13/2007  . SUBOCCIPITAL CRANIECTOMY CERVICAL LAMINECTOMY N/A 05/10/2014   Procedure: SUBOCCIPITAL CRANIECTOMY CERVICAL LAMINECTOMY/DURAPLASTY;  Surgeon: [name], MD;  Location: [facility];  Service: Neurosurgery;  Laterality: N/A;  suboccipital craniectomy with cervical laminectomy and duraplasty  . TUBAL LIGATION      FAMILY HISTORY Family History  Problem Relation Age of Onset  . Aneurysm Mother     Cerebral  . Hypertension Mother   . Hyperlipidemia Mother   . Stroke Mother   . Coronary artery disease Father   . Hypertension Father   . Hyperlipidemia Father   . Lung cancer Brother   . Cancer Brother     Lung  . Colon cancer Neg Hx   . Diabetes Neg Hx   The patient's father died at the age of 66 from a myocardial infarction. The patient's mother died at the age of 68 from a stroke. The patient had 6 brothers, 2 sisters. One brother has a history of lung cancer in the setting of tobacco abuse. Another brother had multiple myeloma. One sister was diagnosed with breast cancer at the age of 34   GYNECOLOGIC HISTORY: (Updated 09/25/2013) Menarche age 31, first live birth age 53,  the patient is [name] [name]. She status post abdominal hysterectomy with bilateral salpingo-oophorectomy. She never took hormone replacement.   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 Her husband [name] [name] works in Charity fundraiser. Daughter [name] [name] formerly lived at home with the patient with her 3 children, all under the age of 47; they have subsequently moved out. She is not employed. Son [name] [name] lives in [place] and is not employed. Daughter [name] [name] lives in [place] and is a Barrister's clerk. Daughter [name] [name] is a Education officer, museum in [place]. The patient has 10 grandchildren. She attends [employer]     ADVANCED DIRECTIVES: Not in place   HEALTH MAINTENANCE:  (Updated 09/25/2013)  Social History  Substance Use Topics  . Smoking status: Former Smoker    Packs/day: 0.50    Years: 15.00    Types: Cigarettes    Quit  date: 07/06/1982  . Smokeless tobacco: Never Used  . Alcohol use No     Colonoscopy:2012  PAP:2014   Bone density: Never  Lipid panel: Not on file    Allergies  Allergen Reactions  . Doxycycline Itching and Swelling    Current Outpatient Prescriptions  Medication Sig Dispense Refill  . acetaminophen (TYLENOL) 500 MG tablet Take 500 mg by mouth every 6 (six) hours as needed for moderate pain.     Marland Kitchen ALPRAZolam (XANAX) 1 MG tablet TAKE 1 TABLET THREE TIMES A DAY AS NEEDED FOR ANXIETY 60 tablet 5  . atenolol (TENORMIN) 50 MG tablet Take 1 tablet (50 mg total) by mouth 2 (two) times daily. 60 tablet 5  . blood glucose meter kit and supplies KIT Dispense based on patient and insurance preference. Use as directed by physician. Diabetes type 2. E11.9 1 each 0  . buPROPion (WELLBUTRIN SR) 150 MG 12 hr tablet Take 1 tablet (150 mg total) by mouth 2 (two) times daily. 180 tablet 1  . Cholecalciferol (VITAMIN D PO) Take 1 tablet by mouth daily. Pt takes 1000 units  daily.    . ciprofloxacin (CIPRO) 250 MG tablet Take 1 tablet (250 mg total) by mouth 2 (two) times daily. (Patient not taking: Reported on 04/15/2016) 14 tablet 0  . diphenhydramine-acetaminophen (TYLENOL PM) 25-500 MG TABS Take 2 tablets by mouth at bedtime.    Marland Kitchen escitalopram (LEXAPRO) 20 MG tablet TAKE 1 TABLET BY MOUTH EVERY DAY (Patient taking differently: Take 20 mg by mouth daily. ) 30 tablet 5  . ketoconazole (NIZORAL) 2 % cream Apply 1 application topically daily. 15 g 0  . metFORMIN (GLUCOPHAGE-XR) 500 MG 24 hr tablet Take 1 tablet (500 mg total) by mouth daily with breakfast. 30 tablet 11  . metFORMIN (GLUMETZA) 500 MG (MOD) 24 hr tablet Take 500 mg by mouth daily with breakfast.    . metoCLOPramide (REGLAN) 10 MG tablet Take one every 6 hours for nauseau 30 tablet 0  . nabumetone (RELAFEN) 500 MG tablet Take 500 mg by mouth 2 (two) times daily.    . pantoprazole (PROTONIX) 40 MG tablet Take 1 tablet (40 mg total) by mouth 2 (two) times daily. 60 tablet 2  . pravastatin (PRAVACHOL) 40 MG tablet Take 1 tablet (40 mg total) by mouth daily. 90 tablet 1  . tamoxifen (NOLVADEX) 20 MG tablet Take 1 tablet (20 mg total) by mouth daily. 90 tablet 12  . valACYclovir (VALTREX) 1000 MG tablet Take 1 tablet (1,000 mg total) by mouth daily. 30 tablet 5   No current facility-administered medications for this visit.     OBJECTIVE: Middle-aged [place] American woman Who appears stated age 7:   04/22/16 1140  BP: 137/64  Pulse: (!) 48  Resp: 16  Temp: 98.2 F (36.8 C)  Body mass index is 32.01 kg/m.  ECOG: 1 Filed Weights   04/22/16 1140  Weight: 163 lb 14.4 oz (74.3 kg)   Sclerae unicteric, pupils round and equal Oropharynx clear and moist-- no thrush or other lesions No cervical or supraclavicular adenopathy Lungs no rales or rhonchi Heart regular rate and rhythm Abd soft, nontender, positive bowel sounds MSK no focal spinal tenderness, no upper extremity lymphedema Neuro:  nonfocal, well oriented, appropriate affect Breasts: The right breast is unremarkable. The left breast is status post lumpectomy followed by radiation. There is no evidence of recurrence. The left axilla is benign.  LAB RESULTS:   Lab Results  Component Value Date  WBC 7.0 04/22/2016   NEUTROABS 4.8 04/22/2016   HGB 12.7 04/22/2016   HCT 37.7 04/22/2016   MCV 85.5 04/22/2016   PLT 190 04/22/2016      Chemistry      Component Value Date/Time   NA 143 04/22/2016 1106   K 4.0 04/22/2016 1106   CL 103 04/15/2016 1403   CO2 29 04/22/2016 1106   BUN 11.7 04/22/2016 1106   CREATININE 1.0 04/22/2016 1106      Component Value Date/Time   CALCIUM 9.8 04/22/2016 1106   ALKPHOS 60 04/22/2016 1106   AST 23 04/22/2016 1106   ALT 26 04/22/2016 1106   BILITOT 0.42 04/22/2016 1106         STUDIES: Ct Abdomen Pelvis W Contrast  Result Date: 04/15/2016 CLINICAL DATA:  Generalized abdominal pain for 2 weeks. EXAM: CT ABDOMEN AND PELVIS WITH CONTRAST TECHNIQUE: Multidetector CT imaging of the abdomen and pelvis was performed using the standard protocol following bolus administration of intravenous contrast. CONTRAST:  148m ISOVUE-300 IOPAMIDOL (ISOVUE-300) INJECTION 61% COMPARISON:  PET-CT 04/26/2013 FINDINGS: Lower chest:  Negative Hepatobiliary: No focal liver abnormality.No evidence of biliary obstruction or stone. Pancreas: Unremarkable. Spleen: Unremarkable. Adrenals/Urinary Tract: Negative adrenals. No hydronephrosis or stone. Unremarkable bladder. Stomach/Bowel: Formed stool throughout the colon may reflect constipation. No obstruction. No appendicitis. Vascular/Lymphatic: Atherosclerotic calcification of the aorta and iliacs. No acute vascular abnormality. No mass or adenopathy. Reproductive:Hysterectomy and possible right oophorectomy. Stable appearance of the left ovary compared to 2014. Other: No ascites or pneumoperitoneum. Musculoskeletal: No acute finding. T12 left body and  pedicle sclerosis is benign given stability since 2014 when there was no hypermetabolism. IMPRESSION: 1. No acute finding. 2. Diffuse formed stool, possible constipation. Electronically Signed   By: JMonte FantasiaM.D.   On: 04/15/2016 17:58   UKoreaExtrem Up Right Ltd  Result Date: 04/09/2016 CLINICAL DATA:  55year old female presenting for annual follow-up status post left breast lumpectomy and 2015. The patient also feels that she has had swollen lymph nodes in the right axilla for the past several weeks. She has had 7 or 8 months of itching in the right breast for which she has been prescribed both an antifungal and a steroid cream without significant improvement. EXAM: 2D DIGITAL DIAGNOSTIC BILATERAL MAMMOGRAM WITH CAD AND ADJUNCT TOMO ULTRASOUND RIGHT AXILLA COMPARISON:  Previous exam(s). ACR Breast Density Category b: There are scattered areas of fibroglandular density. FINDINGS: The left breast lumpectomy site is stable. No suspicious calcifications, masses or areas of distortion are seen in the bilateral breasts. No suspicious right axillary masses are identified mammographically. Mammographic images were processed with CAD. On physical exam, no discrete masses are identified in the right axilla. Targeted ultrasound is performed, showing a prominent right axillary lymph node with cortex measuring up to 4 mm. No suspicious masses are identified. IMPRESSION: 1. There is a prominent right axillary lymph node with cortex measuring up to 4 mm. 2.  No mammographic evidence of malignancy in the bilateral breasts. RECOMMENDATION: 1. The option of follow-up versus biopsy was discussed with the patient as this could be reactive lymph node given the right breast itching. Given the persistent symptoms in the right breast and history of left breast cancer, the patient strongly desires biopsy, which is reasonable. The procedure has been scheduled for 04/14/2016 at 1:45 p.m. I have discussed the findings and  recommendations with the patient. Results were also provided in writing at the conclusion of the visit. If applicable, a reminder letter will be  sent to the patient regarding the next appointment. BI-RADS CATEGORY  4: Suspicious. Electronically Signed   By: Ammie Ferrier M.D.   On: 04/09/2016 12:17   Dg Bone Density  Result Date: 04/09/2016 EXAM: DUAL X-RAY ABSORPTIOMETRY (DXA) FOR BONE MINERAL DENSITY IMPRESSION: Referring Physician:  Laurie Panda PATIENT: Name: NATAUSHA, JUNGWIRTH Patient ID: [id number] Birth Date: 14-Oct-1960 Height: 60.5 in. Sex: Female Measured: 04/09/2016 Weight: 162.0 lbs. Indications: Anastrazole, Bilateral Ovariectomy (65.51), Breast Cancer History, Depression, Estrogen Deficient, Height Loss (781.91), Hysterectomy, Lexapro, Postmenopausal, Protonix, Wellbutrin, Secondary Osteoporosis Fractures: None Treatments: Vitamin D (E933.5) ASSESSMENT: The BMD measured at Femur Total Right is 0.798 g/cm2 with a T-score of -1.7. This patient is considered osteopenic according to [facility]) criteria. L- 2 was excluded due to degenerative changes. There has been a statistically significant decrease in BMD of left hip since prior exam dated 03/28/2014. Spine was not compared to prior study due to exclusion of vertebral bodies on current exam. Site Region Measured Date Measured Age YA BMD Significant CHANGE T-score DualFemur Total Right 04/09/2016    55.0         -1.7    0.798 g/cm2 AP Spine  L1-L4 (L3)  04/09/2016    [age]         0.5     1.250 g/cm2 World Health Organization [facility]) criteria for post-menopausal, Caucasian Women: Normal       T-score at or above -1 SD Osteopenia   T-score between -1 and -2.5 SD Osteoporosis T-score at or below -2.5 SD RECOMMENDATION: [place] recommends that FDA-approved medical therapies be considered in postmenopausal women and men age 29 or older with a: 1. Hip or vertebral (clinical or morphometric) fracture. 2.  T-score of <-2.5 at the spine or hip. 3. Ten-year fracture probability by FRAX of 3% or greater for hip fracture or 20% or greater for major osteoporotic fracture. All treatment decisions require clinical judgment and consideration of individual patient factors, including patient preferences, co-morbidities, previous drug use, risk factors not captured in the FRAX model (e.g. falls, vitamin D deficiency, increased bone turnover, interval significant decline in bone density) and possible under - or over-estimation of fracture risk by FRAX. All patients should ensure an adequate intake of dietary calcium (1200 mg/d) and vitamin D (800 IU daily) unless contraindicated. FOLLOW-UP: People with diagnosed cases of osteoporosis or at high risk for fracture should have regular bone mineral density tests. For patients eligible for Medicare, routine testing is allowed once every 2 years. The testing frequency can be increased to one year for patients who have rapidly progressing disease, those who are receiving or discontinuing medical therapy to restore bone mass, or have additional risk factors. I have reviewed this report, and agree with the above findings. [facility] FRAX* 10-year Probability of Fracture Based on femoral neck BMD: DualFemur (Right) Major Osteoporotic Fracture: 2.7% Hip Fracture:                0.2% Population:                  [place] (Black) Risk Factors:                Secondary Osteoporosis *FRAX is a [place] of [employer] for Metabolic Bone Disease, a [place] (WHO) [facility]. ASSESSMENT: The probability of a major osteoporotic fracture is 2.7 % within the next ten years. The probability of a hip fracture is 0.2 % within the  next ten years. Electronically Signed   By: Lajean Manes M.D.   On: 04/09/2016 11:00   Dg Abd Acute W/chest  Result Date: 04/15/2016 CLINICAL DATA:  Abdominal pain for 2 weeks. Nausea for 2 days.  History of breast carcinoma EXAM: DG ABDOMEN ACUTE W/ 1V CHEST COMPARISON:  Chest radiograph August 22, 2014 FINDINGS: PA chest: There is no edema or consolidation. Heart size and pulmonary vascularity are normal. No adenopathy. There are surgical clips in left axillary region. Supine and upright abdomen: There is moderate stool throughout the colon. There is no bowel dilatation or air-fluid levels suggesting bowel obstruction. No free air. There are phleboliths in the pelvis. IMPRESSION: Moderate stool in colon. No bowel obstruction or free air. No lung edema or consolidation. Electronically Signed   By: Lowella Grip III M.D.   On: 04/15/2016 15:47   Mm Diag Breast Tomo Bilateral  Result Date: 04/09/2016 CLINICAL DATA:  55 year old female presenting for annual follow-up status post left breast lumpectomy and 2015. The patient also feels that she has had swollen lymph nodes in the right axilla for the past several weeks. She has had 7 or 8 months of itching in the right breast for which she has been prescribed both an antifungal and a steroid cream without significant improvement. EXAM: 2D DIGITAL DIAGNOSTIC BILATERAL MAMMOGRAM WITH CAD AND ADJUNCT TOMO ULTRASOUND RIGHT AXILLA COMPARISON:  Previous exam(s). ACR Breast Density Category b: There are scattered areas of fibroglandular density. FINDINGS: The left breast lumpectomy site is stable. No suspicious calcifications, masses or areas of distortion are seen in the bilateral breasts. No suspicious right axillary masses are identified mammographically. Mammographic images were processed with CAD. On physical exam, no discrete masses are identified in the right axilla. Targeted ultrasound is performed, showing a prominent right axillary lymph node with cortex measuring up to 4 mm. No suspicious masses are identified. IMPRESSION: 1. There is a prominent right axillary lymph node with cortex measuring up to 4 mm. 2.  No mammographic evidence of malignancy in  the bilateral breasts. RECOMMENDATION: 1. The option of follow-up versus biopsy was discussed with the patient as this could be reactive lymph node given the right breast itching. Given the persistent symptoms in the right breast and history of left breast cancer, the patient strongly desires biopsy, which is reasonable. The procedure has been scheduled for 04/14/2016 at 1:45 p.m. I have discussed the findings and recommendations with the patient. Results were also provided in writing at the conclusion of the visit. If applicable, a reminder letter will be sent to the patient regarding the next appointment. BI-RADS CATEGORY  4: Suspicious. Electronically Signed   By: Ammie Ferrier M.D.   On: 04/09/2016 12:17   [place] Rt Breast Bx W Loc Dev 1st Lesion Img Bx Spec US Guide  Addendum Date: 04/20/2016   ADDENDUM REPORT: 04/15/2016 11:14 ADDENDUM: Pathology revealed a benign lymph node with reactive hyperplasia in the right axilla. This was found to be concordant by Dr. Dorise Bullion. Pathology results were discussed with the patient by telephone. The patient reported doing well after the biopsy. Post biopsy instructions and care were reviewed and questions were answered. The patient was encouraged to call [facility] for any additional concerns. The patient was instructed to return for annual diagnostic mammography and informed that a reminder letter would be sent regarding this appointment. Pathology results reported by Susa Raring RN, BSN on 04/15/2016. Electronically Signed   By: Dorise Bullion III  M.D   On: 04/15/2016 11:14   Result Date: 04/20/2016 CLINICAL DATA:  Right axillary lymph node with a borderline cortex. EXAM: ULTRASOUND GUIDED CORE NEEDLE BIOPSY OF A RIGHT AXILLARY NODE COMPARISON:  Previous exam(s). FINDINGS: I met with the patient and we discussed the procedure of ultrasound-guided biopsy, including benefits and alternatives. We discussed the high likelihood of a  successful procedure. We discussed the risks of the procedure, including infection, bleeding, tissue injury, clip migration, and inadequate sampling. Informed written consent was given. The usual time-out protocol was performed immediately prior to the procedure. Using sterile technique and 1% Lidocaine as local anesthetic, under direct ultrasound visualization, a 14 gauge spring-loaded device was used to perform biopsy of a borderline lymph node in the right axilla using a lateral approach. At the conclusion of the procedure a HydroMARK tissue marker clip was deployed into the biopsy cavity. Follow up 2 view mammogram was performed and dictated separately. IMPRESSION: Ultrasound guided biopsy of a borderline right axillary lymph node. No apparent complications. Electronically Signed: By: Dorise Bullion III M.D On: 04/14/2016 14:31     ASSESSMENT/PLAN: 55 y.o. [place], [place] woman   (1)  status post left breast upper outer quadrant biopsy 03/29/2013 for a clinical T2-T3 pN1, stage IIB-IIIA invasive ductal carcinoma, grade 2-3, estrogen receptor 39% positive (with moderate staining intensity), progesterone receptor negative, with an MIB-1 of 83%, and HER-2 amplified by CISH, with a ratio of 7.5 and 15 HER-2 copies per cell  (2) neoadjuvant chemotherapy, with trastuzumab/ pertuzumab/ carboplatin/ docetaxel repeated every 3 weeks x6, completed 08/21/2013. Pertuzumab  held after 4 cycles secondary to diarrhea. The patient received Neulasta on day 2 at [facility] per her request.   (3) Trastuzumab continued for total of one year (last dose 04/13/2014)). Most recent echo 06/04/2014 showed a well preserved ejection fraction.  (4)  status post left lumpectomy and axillary node dissection 09/12/2013, with a complete pathologic response (a total of 16 lymph nodes were examined,1 sentinel and 15 non-sentinel),  YpT0, ypN0.  (5) adjuvant [facility] at [facility] completed 12/08/2013  (6) started anastrozole  01/16/2014  (a) bone density 04/09/2016 shows osteopenia with a T score of -1.7   (b) switched to tamoxifen as of OCT 2017 because of vaginal dryness issues   (i) status post remote total abdominal hysterectomy with bilateral salpingo-oophorectomy  PLAN: Domini is now 2-1/2 years out from definitive surgery for her breast cancer with no evidence of disease recurrence. This is very favorable.  Overall her prognosis is good. Unfortunately she continues to be depressed and there are multiple social economic problems that don't seem to have any easy solutions.  2 issues that we may be able to help her with our the infection under her right breast, which currently appears minimal to exam, and which seems to be responding to nizoral; and vaginal dryness issues. I refilled her Nizoral prescription today  As far as the vaginal dryness problems, we are switching her to tamoxifen. She understands that this may cause blood clots in addition to hot flashes and vaginal wetness. She will let me know if she develops any problems from this medication. If she tolerates tamoxifen well she can consider vaginal estrogens, which would be safe while on this medication.  I'm going to see her again in 3 months. I will also ask our chaplain to call the patient and provide some spiritual and possibly financial support Chauncey Cruel, MD 04/23/2016 7:56 AM

## 2016-04-23 LAB — CANCER ANTIGEN 27.29: CA 27.29: 11.9 U/mL (ref 0.0–38.6)

## 2016-04-27 NOTE — Progress Notes (Signed)
Counselor received referral from [facility]. Counselor called patient and left voicemail as way of introduction to offer support as part of the [facility] team.  Lamount Cohen, Counseling Intern Department for [facility] Cottage Supervisor - [address] [place], [place]

## 2016-04-27 NOTE — Progress Notes (Addendum)
1400 Medical Nutrition Therapy:  Appt start time: 1400  end time:  1500.  Assessment:  Primary concerns today: Type 2 DM and obesity. Recently was told she was diabetic with A1C of 6.6. Had been prediabetic previously. Followed by Oncology for leftt breast cancer. Dr. Dr. Wolfgang Phoenix and Pearson Forster, NP are PCP.  Not on any medications yet. Will try to improve DM with diet modifications and weight loss. Although I would recommend a low dose of Metformin extended release due to her coexisting medical issues to reduce cardiovascular risks and since she has breast cancer.   Diet is insuffient to meet her needs. She admits her biggest issues is eating 1-2 times per day and not eating balanced meals. She is willing to improve diet and start exercising a little when she can by walking in her house and chair exercises. Diet is high in fat, sodium and excessive in carbs.  Lab Results  Component Value Date   HGBA1C 6.6 03/03/2016    Preferred Learning Style:   No preference indicated    Learning Readiness:Not ready    Ready  Change in progress   MEDICATIONS:  See list   DIETARY INTAKE:.    Eats 1-2 meals per day.  Usual physical activity: ADL  Estimated energy needs: 1500  calories 170 g carbohydrates 112 g protein 42 g fat  Progress Towards Goal(s):  In progress.   Nutritional Diagnosis:  NB-1.1 Food and nutrition-related knowledge deficit As related to Diabetes.  As evidenced by A1C 6.6%.    Intervention:  Nutrition and Diabetes education provided on My Plate, CHO counting, meal planning, portion sizes, timing of meals, avoiding snacks between meals unless having a low blood sugar, target ranges for A1C and blood sugars, signs/symptoms and treatment of hyper/hypoglycemia, monitoring blood sugars, taking medications as prescribed, benefits of exercising 30 minutes per day and prevention of complications of DM.  Goals 1. Follow My Plate Method 2. Eat three meals per day eating  meals on time 3. Walk/exercise 30 minutes three times per week 4. Increase fresh fruits and low carb vegetables. 5. Lose 1 lbs per week   Teaching Method Utilized: Visual Auditory Hands on  Handouts given during visit include:  The Plate Method  Meal Plan  Diabetes Instructions.   Barriers to learning/adherence to lifestyle change: None  Demonstrated degree of understanding via:  Teach Back   Monitoring/Evaluation:  Dietary intake, exercise, meal planning, SBG, and body weight in 1 month(s).

## 2016-05-01 ENCOUNTER — Encounter: Payer: Self-pay | Admitting: Endocrinology

## 2016-05-01 ENCOUNTER — Encounter: Payer: BLUE CROSS/BLUE SHIELD | Admitting: Dietician

## 2016-05-01 ENCOUNTER — Ambulatory Visit (INDEPENDENT_AMBULATORY_CARE_PROVIDER_SITE_OTHER): Payer: BLUE CROSS/BLUE SHIELD | Admitting: Endocrinology

## 2016-05-01 ENCOUNTER — Other Ambulatory Visit: Payer: Self-pay | Admitting: *Deleted

## 2016-05-01 VITALS — BP 132/82 | HR 54 | Ht 60.0 in | Wt 164.0 lb

## 2016-05-01 DIAGNOSIS — E119 Type 2 diabetes mellitus without complications: Secondary | ICD-10-CM | POA: Diagnosis not present

## 2016-05-01 MED ORDER — PANTOPRAZOLE SODIUM 40 MG PO TBEC: 40.0000 mg | DELAYED_RELEASE_TABLET | Freq: Two times a day (BID) | ORAL | 2 refills | Status: DC

## 2016-05-01 NOTE — Patient Instructions (Addendum)
check your blood sugar once a day.  vary the time of day when you check, between before the 3 meals, and at bedtime.  also check if you have symptoms of your blood sugar being too high or too low.  please keep a record of the readings and bring it to your next appointment here (or you can bring the meter itself).  You can write it on any piece of paper.  please call us sooner if your blood sugar goes below 70, or if you have a lot of readings over 200.   A diabetes blood test is requested for you today.  We'll let you know about the results. Please come back for a follow-up appointment in 3 months.      Bariatric Surgery You have so much to gain by losing weight.  You may have already tried every diet and exercise plan imaginable.  And, you may have sought advice from your family physician, too.   Sometimes, in spite of such diligent efforts, you may not be able to achieve long-term results by yourself.  In cases of severe obesity, bariatric or weight loss surgery is a proven method of achieving long-term weight control.  Our Services Our bariatric surgery programs offer our patients new hope and long-term weight-loss solution.  Since introducing our services in 2003, we have conducted more than 2,400 successful procedures.  Our program is designated as a Programmer, multimedia by the Metabolic and Bariatric Surgery Accreditation and Quality Improvement Program (MBSAQIP), a IT trainer that sets rigorous patient safety and outcome standards.  Our program is also designated as a Ecologist by [employer].   Our exceptional weight-loss surgery team specializes in diagnosis, treatment, follow-up care, and ongoing support for our patients with severe weight loss challenges.  We currently offer laparoscopic sleeve gastrectomy, gastric bypass, and adjustable gastric band (LAP-BAND).    Attend our [place] Choosing to undergo a bariatric procedure is a big  decision, and one that should not be taken lightly.  You now have two options in how you learn about weight-loss surgery - in person or online.  Our objective is to ensure you have all of the information that you need to evaluate the advantages and obligations of this life changing procedure.  Please note that you are not alone in this process, and our experienced team is ready to assist and answer all of your questions.  There are several ways to register for a seminar (either on-line or in person): 1)  Call [phone] 2) Go on-line to [facility] and register for either type of seminar.  [email or web address]

## 2016-05-01 NOTE — Progress Notes (Signed)
Subjective:    Patient ID: [name] [name], female    DOB: [birth year], 55 y.o.   MRN: [id number]  HPI Pt returns for f/u of diabetes mellitus: DM type: 2 Dx'ed: 3568 Complications: none Therapy: metformin GDM: never DKA: never Severe hypoglycemia: never Pancreatitis: never Other: she also has h/o reactive hypoglycemia; she has never been on insulin. Interval history: She was seen in ER, for n/v.  Despite continuation of the metformin, sxs have resolved.  Past Medical History:  Diagnosis Date  . Allergic rhinitis   . Anemia, unspecified 05/30/2013  . Anxiety   . Breast cancer ([place]) 03/31/13   left  . Cancer ([place])   . Chest pain 2009   Consultation-Rothbart, negative chest CT; nl echo in 2005; h/o palpitations  . Colitis 2010   not IBD  . Degenerative joint disease    + degenerative joint disease of the lumbosacral spine  . Depression   . Diabetes ([place])   . Diabetes ([place])   . Dysrhythmia    palpations  . GERD (gastroesophageal reflux disease)    "a little"  . Headache   . Heart murmur    "small"  . Herpes simplex type II infection   . Hypercholesteremia    "slightly high"  . Hypertension    does not have high blood pressure  . 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 Palpitations   . Wears glasses     Past Surgical History:  Procedure Laterality Date  . ABDOMINAL HYSTERECTOMY  03/13/2007   TAH ?BSO--Dr Levin Bacon, [place]  . BREAST EXCISIONAL BIOPSY  04/2003   Left; benign disease  . BREAST LUMPECTOMY WITH NEEDLE LOCALIZATION AND AXILLARY LYMPH NODE DISSECTION Left 09/12/2013   Procedure: BREAST LUMPECTOMY WITH NEEDLE LOCALIZATION AND AXILLARY LYMPH NODE DISSECTION;  Surgeon: [name], MD;  Location: [facility];  Service: General;  Laterality: Left;  and axilla  . BREAST SURGERY    . COLONOSCOPY  10/2010   proctitis; melanosis coli  . EYE SURGERY Left    "fix lazy eye"  . PORTACATH PLACEMENT Right 04/21/2013   Procedure: INSERTION PORT-A-CATH;  Surgeon:  [name], MD;  [facility];  Service: General;  Laterality: Right;  . RIGHT OOPHORECTOMY  03/13/2007  . SUBOCCIPITAL CRANIECTOMY CERVICAL LAMINECTOMY N/A 05/10/2014   Procedure: SUBOCCIPITAL CRANIECTOMY CERVICAL LAMINECTOMY/DURAPLASTY;  Surgeon: [name], MD;  Location: [facility];  Service: Neurosurgery;  Laterality: N/A;  suboccipital craniectomy with cervical laminectomy and duraplasty  . TUBAL LIGATION      Social History   Social History  . Marital status: Married    Spouse name: N/A  . Number of children: 4  . Years of education: N/A   Occupational History  . Factory work [employer]   Social History Main Topics  . Smoking status: Former Smoker    Packs/day: 0.50    Years: 15.00    Types: Cigarettes    Quit date: 07/06/1982  . Smokeless tobacco: Never Used  . Alcohol use No  . Drug use: No  . Sexual activity: Yes    Partners: Male    Birth control/ protection: Surgical     Comment: TAH   Other Topics Concern  . Not on file   Social History Narrative  . No narrative on file    Current Outpatient Prescriptions on File Prior to Visit  Medication Sig Dispense Refill  . acetaminophen (TYLENOL) 500 MG tablet Take 500 mg by mouth every 6 (six) hours as needed  for moderate pain.     Marland Kitchen ALPRAZolam (XANAX) 1 MG tablet TAKE 1 TABLET THREE TIMES A DAY AS NEEDED FOR ANXIETY 60 tablet 5  . atenolol (TENORMIN) 50 MG tablet Take 1 tablet (50 mg total) by mouth 2 (two) times daily. 60 tablet 5  . blood glucose meter kit and supplies KIT Dispense based on patient and insurance preference. Use as directed by physician. Diabetes type 2. E11.9 1 each 0  . buPROPion (WELLBUTRIN SR) 150 MG 12 hr tablet Take 1 tablet (150 mg total) by mouth 2 (two) times daily. 180 tablet 1  . Cholecalciferol (VITAMIN D PO) Take 1 tablet by mouth daily. Pt takes 1000 units daily.    . diphenhydramine-acetaminophen (TYLENOL PM) 25-500 MG TABS Take 2 tablets by mouth at bedtime.    Marland Kitchen  escitalopram (LEXAPRO) 20 MG tablet TAKE 1 TABLET BY MOUTH EVERY DAY (Patient taking differently: Take 20 mg by mouth daily. ) 30 tablet 5  . ketoconazole (NIZORAL) 2 % cream Apply 1 application topically daily. 15 g 0  . metFORMIN (GLUCOPHAGE-XR) 500 MG 24 hr tablet Take 1 tablet (500 mg total) by mouth daily with breakfast. 30 tablet 11  . metoCLOPramide (REGLAN) 10 MG tablet Take one every 6 hours for nauseau 30 tablet 0  . nabumetone (RELAFEN) 500 MG tablet Take 500 mg by mouth 2 (two) times daily.    . pravastatin (PRAVACHOL) 40 MG tablet Take 1 tablet (40 mg total) by mouth daily. 90 tablet 1  . tamoxifen (NOLVADEX) 20 MG tablet Take 1 tablet (20 mg total) by mouth daily. 90 tablet 12  . valACYclovir (VALTREX) 1000 MG tablet Take 1 tablet (1,000 mg total) by mouth daily. 30 tablet 5  . [DISCONTINUED] potassium chloride SA (K-DUR,KLOR-CON) 20 MEQ tablet Take 1 tablet (20 mEq total) by mouth 2 (two) times daily. 20 tablet 1   No current facility-administered medications on file prior to visit.     Allergies  Allergen Reactions  . Doxycycline Itching and Swelling    Family History  Problem Relation Age of Onset  . Aneurysm Mother     Cerebral  . Hypertension Mother   . Hyperlipidemia Mother   . Stroke Mother   . Coronary artery disease Father   . Hypertension Father   . Hyperlipidemia Father   . Lung cancer Brother   . Cancer Brother     Lung  . Colon cancer Neg Hx   . Diabetes Neg Hx     BP 132/82   Pulse (!) 54   Ht 5' (1.524 m)   Wt 164 lb (74.4 kg)   LMP 12/04/2008   SpO2 98%   BMI 32.03 kg/m   Review of Systems She has lost 1 lb since last ov.  She denies hypoglycemia.      Objective:   Physical Exam VITAL SIGNS:  See vs page GENERAL: no distress Pulses: dorsalis pedis intact bilat.   MSK: no deformity of the feet CV: no leg edema Skin:  no ulcer on the feet.  normal color and temp on the feet. Neuro: sensation is intact to touch on the feet.        Assessment & Plan:  Type 2 DM: apparently well-controlled.   Reactive hypoglycemia, better with metformin Obesity, not significantly changed. N/v, appears not related to metformin.  We'll continue same for now.   Patient is advised the following: Patient Instructions  check your blood sugar once a day.  vary the time of  day when you check, between before the 3 meals, and at bedtime.  also check if you have symptoms of your blood sugar being too high or too low.  please keep a record of the readings and bring it to your next appointment here (or you can bring the meter itself).  You can write it on any piece of paper.  please call us sooner if your blood sugar goes below 70, or if you have a lot of readings over 200.   A diabetes blood test is requested for you today.  We'll let you know about the results. Please come back for a follow-up appointment in 3 months.      Bariatric Surgery You have so much to gain by losing weight.  You may have already tried every diet and exercise plan imaginable.  And, you may have sought advice from your family physician, too.   Sometimes, in spite of such diligent efforts, you may not be able to achieve long-term results by yourself.  In cases of severe obesity, bariatric or weight loss surgery is a proven method of achieving long-term weight control.  Our Services Our bariatric surgery programs offer our patients new hope and long-term weight-loss solution.  Since introducing our services in 2003, we have conducted more than 2,400 successful procedures.  Our program is designated as a Programmer, multimedia by the Metabolic and Bariatric Surgery Accreditation and Quality Improvement Program (MBSAQIP), a IT trainer that sets rigorous patient safety and outcome standards.  Our program is also designated as a Ecologist by [employer].   Our exceptional weight-loss surgery team specializes in diagnosis, treatment, follow-up  care, and ongoing support for our patients with severe weight loss challenges.  We currently offer laparoscopic sleeve gastrectomy, gastric bypass, and adjustable gastric band (LAP-BAND).    Attend our [place] Choosing to undergo a bariatric procedure is a big decision, and one that should not be taken lightly.  You now have two options in how you learn about weight-loss surgery - in person or online.  Our objective is to ensure you have all of the information that you need to evaluate the advantages and obligations of this life changing procedure.  Please note that you are not alone in this process, and our experienced team is ready to assist and answer all of your questions.  There are several ways to register for a seminar (either on-line or in person): 1)  Call [phone] 2) Go on-line to [facility] and register for either type of seminar.  [email or web address]

## 2016-05-04 ENCOUNTER — Ambulatory Visit: Payer: BLUE CROSS/BLUE SHIELD

## 2016-05-04 ENCOUNTER — Other Ambulatory Visit: Payer: Self-pay | Admitting: Nurse Practitioner

## 2016-05-04 ENCOUNTER — Ambulatory Visit: Payer: BLUE CROSS/BLUE SHIELD | Admitting: Nurse Practitioner

## 2016-05-04 ENCOUNTER — Other Ambulatory Visit: Payer: BLUE CROSS/BLUE SHIELD

## 2016-05-04 LAB — FRUCTOSAMINE: Fructosamine: 206 umol/L (ref 190–270)

## 2016-05-05 NOTE — Telephone Encounter (Signed)
Six mo worth ok if time for ref

## 2016-05-05 NOTE — Progress Notes (Signed)
Dr. Steve to see 

## 2016-05-08 ENCOUNTER — Other Ambulatory Visit: Payer: Self-pay | Admitting: *Deleted

## 2016-05-08 DIAGNOSIS — C50412 Malignant neoplasm of upper-outer quadrant of left female breast: Secondary | ICD-10-CM

## 2016-05-13 ENCOUNTER — Ambulatory Visit (INDEPENDENT_AMBULATORY_CARE_PROVIDER_SITE_OTHER): Payer: BLUE CROSS/BLUE SHIELD | Admitting: Nurse Practitioner

## 2016-05-13 ENCOUNTER — Encounter: Payer: Self-pay | Admitting: Nurse Practitioner

## 2016-05-13 VITALS — BP 104/70 | HR 63 | Temp 98.3°F | Ht 60.6 in | Wt 162.0 lb

## 2016-05-13 DIAGNOSIS — K219 Gastro-esophageal reflux disease without esophagitis: Secondary | ICD-10-CM

## 2016-05-13 DIAGNOSIS — K5900 Constipation, unspecified: Secondary | ICD-10-CM | POA: Diagnosis not present

## 2016-05-13 NOTE — Assessment & Plan Note (Signed)
Symptoms well-controlled on current PPI. Continue current meds. Continue to monitor. Return for follow-up in 2 months.

## 2016-05-13 NOTE — Progress Notes (Signed)
Referring Provider: Mikey Kirschner, MD Primary Care Physician:  Mickie Hillier, MD Primary GI:  Dr. [name]  Chief Complaint  Patient presents with  . Nausea  . Emesis    HPI:   [name] [name] is a 55 y.o. female who presents for nausea vomiting and dark stools. The patient was last seen in our office 12/10/2015 for GERD and bloating. At that time her symptoms were noted to be improved, previously on Protonix for years which she stopped a couple months ago with return of symptoms. She restarted Protonix and symptoms again improved with rare breakthrough. Also noted constipation. Not on NSAIDs at that time. Last colonoscopy 5 years ago in [place] essentially normal and no indication for early interval repeat. No history of upper endoscopy in our system.  Review of [place] and meds list show she has a history of htn, hyperlipidemia, and DM. She was previously on Reglan and is not currently taking it. Has never had a GES before (in our system).   Today she states she's doing well overall. She is still taking Protonix. For the last couple weeks she has had significant nausea and occasional vomiting. She went tot he ER at [facility] and was told she's constipated. Her stools were black for about 1 week. She did use Pepto, but her stools were dark before taking Pepto. Also with cramping, bloating. She has a bowel movement about one to two times a week but has sensation of incomplete emptying and has to have another one later on to try and empty. Stools are soft and pass easily. In the ER her CBC, CMP, and Lipase were normal. CT showed possible constipation. She was d/c to home on Reglan for nausea. She states Reglan did help her nausea. Denies hematochezia, further dark stools, hematemesis, fever, chills, acute changes in bowel habits. Denies chest pain, dyspnea, dizziness, lightheadedness, syncope, near syncope. Denies any other upper or lower GI symptoms.  Past Medical History:  Diagnosis Date  .  Allergic rhinitis   . Anemia, unspecified 05/30/2013  . Anxiety   . Breast cancer ([place]) 03/31/13   left  . Cancer ([place])   . Chest pain 2009   Consultation-[facility]othbart, negative chest CT; nl echo in 2005; h/o palpitations  . Colitis 2010   not IBD  . Degenerative joint disease    + degenerative joint disease of the lumbosacral spine  . Depression   . Diabetes ([place])   . Diabetes ([place])   . Dysrhythmia    palpations  . GERD (gastroesophageal reflux disease)    "a little"  . Headache   . Heart murmur    "small"  . Herpes simplex type II infection   . Hypercholesteremia    "slightly high"  . Hypertension    does not have high blood pressure  . 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 Palpitations   . Wears glasses     Past Surgical History:  Procedure Laterality Date  . ABDOMINAL HYSTERECTOMY  03/13/2007   TAH ?BSO--Dr Levin Bacon, [place]  . BREAST EXCISIONAL BIOPSY  04/2003   Left; benign disease  . BREAST LUMPECTOMY WITH NEEDLE LOCALIZATION AND AXILLARY LYMPH NODE DISSECTION Left 09/12/2013   Procedure: BREAST LUMPECTOMY WITH NEEDLE LOCALIZATION AND AXILLARY LYMPH NODE DISSECTION;  Surgeon: [name], MD;  Location: [place] [facility];  Service: General;  Laterality: Left;  and axilla  . BREAST SURGERY    . COLONOSCOPY  10/2010   proctitis; melanosis coli  . EYE SURGERY Left    "  fix lazy eye"  . PORTACATH PLACEMENT Right 04/21/2013   Procedure: INSERTION PORT-A-CATH;  Surgeon: [name], MD;  [facility];  Service: General;  Laterality: Right;  . RIGHT OOPHORECTOMY  03/13/2007  . SUBOCCIPITAL CRANIECTOMY CERVICAL LAMINECTOMY N/A 05/10/2014   Procedure: SUBOCCIPITAL CRANIECTOMY CERVICAL LAMINECTOMY/DURAPLASTY;  Surgeon: [name], MD;  Location: [place] [facility];  Service: Neurosurgery;  Laterality: N/A;  suboccipital craniectomy with cervical laminectomy and duraplasty  . TUBAL LIGATION      Current Outpatient Prescriptions  Medication Sig Dispense Refill  .  acetaminophen (TYLENOL) 500 MG tablet Take 500 mg by mouth every 6 (six) hours as needed for moderate pain.     Marland Kitchen ALPRAZolam (XANAX) 1 MG tablet TAKE 1 TABLET BY MOUTH 3 TIMES A DAY AS NEEDED FOR ANXIETY 60 tablet 5  . atenolol (TENORMIN) 50 MG tablet Take 1 tablet (50 mg total) by mouth 2 (two) times daily. 60 tablet 5  . blood glucose meter kit and supplies KIT Dispense based on patient and insurance preference. Use as directed by physician. Diabetes type 2. E11.9 1 each 0  . buPROPion (WELLBUTRIN SR) 150 MG 12 hr tablet Take 1 tablet (150 mg total) by mouth 2 (two) times daily. 180 tablet 1  . Cholecalciferol (VITAMIN D PO) Take 1 tablet by mouth daily. Pt takes 1000 units daily.    . diphenhydramine-acetaminophen (TYLENOL PM) 25-500 MG TABS Take 2 tablets by mouth at bedtime.    Marland Kitchen escitalopram (LEXAPRO) 20 MG tablet TAKE 1 TABLET BY MOUTH EVERY DAY (Patient taking differently: Take 20 mg by mouth daily. ) 30 tablet 5  . ketoconazole (NIZORAL) 2 % cream Apply 1 application topically daily. 15 g 0  . metFORMIN (GLUCOPHAGE-XR) 500 MG 24 hr tablet Take 1 tablet (500 mg total) by mouth daily with breakfast. 30 tablet 11  . nabumetone (RELAFEN) 500 MG tablet Take 500 mg by mouth 2 (two) times daily.    . pantoprazole (PROTONIX) 40 MG tablet Take 1 tablet (40 mg total) by mouth 2 (two) times daily. 60 tablet 2  . pravastatin (PRAVACHOL) 40 MG tablet Take 1 tablet (40 mg total) by mouth daily. 90 tablet 1  . tamoxifen (NOLVADEX) 20 MG tablet Take 1 tablet (20 mg total) by mouth daily. 90 tablet 12  . valACYclovir (VALTREX) 1000 MG tablet Take 1 tablet (1,000 mg total) by mouth daily. 30 tablet 5   No current facility-administered medications for this visit.     Allergies as of 05/13/2016 - Review Complete 05/13/2016  Allergen Reaction Noted  . Doxycycline Itching and Swelling 06/27/2012    Family History  Problem Relation Age of Onset  . Aneurysm Mother     Cerebral  . Hypertension Mother     . Hyperlipidemia Mother   . Stroke Mother   . Coronary artery disease Father   . Hypertension Father   . Hyperlipidemia Father   . Lung cancer Brother   . Cancer Brother     Lung  . Colon cancer Neg Hx   . Diabetes Neg Hx     Social History   Social History  . Marital status: Married    Spouse name: N/A  . Number of children: 4  . Years of education: N/A   Occupational History  . Factory work [employer]   Social History Main Topics  . Smoking status: Former Smoker    Packs/day: 0.50    Years: 15.00    Types: Cigarettes  Quit date: 07/06/1982  . Smokeless tobacco: Never Used  . Alcohol use No  . Drug use: No  . Sexual activity: Yes    Partners: Male    Birth control/ protection: Surgical     Comment: TAH   Other Topics Concern  . None   Social History Narrative  . None    Review of Systems: Complete ROS negative except as per HPI.   Physical Exam: BP 104/70   Pulse 63   Temp 98.3 F (36.8 C) (Oral)   Ht 5' 0.6" (1.539 m)   Wt 162 lb (73.5 kg)   LMP 12/04/2008   BMI 31.02 kg/m  General:   Alert and oriented. Pleasant and cooperative. Well-nourished and well-developed.  Head:  Normocephalic and atraumatic. Eyes:  Without icterus, sclera clear and conjunctiva pink.  Ears:  Normal auditory acuity. Cardiovascular:  S1, S2 present without murmurs appreciated. Normal pulses noted. Extremities without clubbing or edema. Respiratory:  Clear to auscultation bilaterally. No wheezes, rales, or rhonchi. No distress.  Gastrointestinal:  +BS, soft, and non-distended. Mild to moderate generalized abdominal TTP. No HSM noted. No guarding or rebound. No masses appreciated.  Rectal:  Deferred  Musculoskalatal:  Symmetrical without gross deformities. Neurologic:  Alert and oriented x4;  grossly normal neurologically. Psych:  Alert and cooperative. Normal mood and affect. Heme/Lymph/Immune: No excessive bruising noted.    05/13/2016 2:27 PM   Disclaimer:  This note was dictated with voice recognition software. Similar sounding words can inadvertently be transcribed and may not be corrected upon review.

## 2016-05-13 NOTE — Assessment & Plan Note (Signed)
The patient has dull/crampy abdominal pain associated with nausea and vomiting and likely constipation. She has a bowel movement one to 2 times a week. Stools are soft, but has persistent sensation of incomplete emptying. Her CT of the abdomen in the emergency department showed stool throughout the colon consistent with constipation. I will start her on Linzess 72 g once a day on an empty stomach. I'll provide samples for 2 weeks and a co-pay card. I have requested she call with a progress report in one and a half to 2 weeks. Return for follow-up in 2 months.

## 2016-05-13 NOTE — Patient Instructions (Signed)
1. I am giving you samples of Linzess 72 g. Take 1 pill, once a day on an empty stomach. 2. I will also provide you with a co-pay card to reduce her costs encased the medications are effective. 3. Call in 1-1/2-2 weeks with a progress report on how the medicine is working. 4. Return for follow-up in 2 months.

## 2016-05-13 NOTE — Progress Notes (Signed)
cc'ed to pcp °

## 2016-05-21 ENCOUNTER — Ambulatory Visit (HOSPITAL_BASED_OUTPATIENT_CLINIC_OR_DEPARTMENT_OTHER): Payer: BLUE CROSS/BLUE SHIELD | Admitting: Genetic Counselor

## 2016-05-21 ENCOUNTER — Ambulatory Visit: Payer: BLUE CROSS/BLUE SHIELD | Admitting: Nurse Practitioner

## 2016-05-21 ENCOUNTER — Other Ambulatory Visit: Payer: BLUE CROSS/BLUE SHIELD

## 2016-05-21 ENCOUNTER — Encounter: Payer: Self-pay | Admitting: Genetic Counselor

## 2016-05-21 DIAGNOSIS — Z8042 Family history of malignant neoplasm of prostate: Secondary | ICD-10-CM

## 2016-05-21 DIAGNOSIS — Z801 Family history of malignant neoplasm of trachea, bronchus and lung: Secondary | ICD-10-CM | POA: Diagnosis not present

## 2016-05-21 DIAGNOSIS — Z853 Personal history of malignant neoplasm of breast: Secondary | ICD-10-CM

## 2016-05-21 DIAGNOSIS — Z807 Family history of other malignant neoplasms of lymphoid, hematopoietic and related tissues: Secondary | ICD-10-CM

## 2016-05-21 DIAGNOSIS — Z803 Family history of malignant neoplasm of breast: Secondary | ICD-10-CM | POA: Diagnosis not present

## 2016-05-21 DIAGNOSIS — Z806 Family history of leukemia: Secondary | ICD-10-CM

## 2016-05-22 ENCOUNTER — Encounter: Payer: Self-pay | Admitting: Genetic Counselor

## 2016-05-22 DIAGNOSIS — Z803 Family history of malignant neoplasm of breast: Secondary | ICD-10-CM | POA: Insufficient documentation

## 2016-05-22 NOTE — Progress Notes (Signed)
REFERRING PROVIDER: Lurline Del, MD  PRIMARY PROVIDER:  Mickie Hillier, MD  PRIMARY REASON FOR VISIT:  1. History of breast cancer   2. Family history of breast cancer in female   3. Family history of prostate cancer   4. Family history of leukemia   5. Family history of lung cancer   6. Family history of multiple myeloma      HISTORY OF PRESENT ILLNESS:   Ms. Rogoff, a 55 y.o. female, was seen for a [facility] cancer genetics consultation at the request of Dr. Jana Hakim due to a personal and family history of cancer.  Ms. Ralls presents to clinic today to discuss the possibility of a hereditary predisposition to cancer, genetic testing, and to further clarify her future cancer risks, as well as potential cancer risks for family members.   In September 2014, at the age of 37, Ms. Mannings was diagnosed with invasive ductal carcinoma of the left breast.  Hormone receptor status was ER+, PR-, and Her2+.  This was treated with left lumpectomy, radiation, and chemotherapy.  Ms. Zeimet reports no additional personal history of cancer.      HORMONAL RISK FACTORS:  Menarche was at age 53.  First live birth at age 72.  OCP use for approximately 10 years.  Ovaries intact: still has left ovary; TAH-RSO in 2008 for fibroids.  Hysterectomy: yes.  Menopausal status: has had TAH-RSO.  HRT use: 0 years. Colonoscopy: yes; had her first colonoscopy in 2012; reports no history of polyps and is on a 10-year schedule. Mammogram within the last year: yes. Number of breast biopsies: 2. Up to date with pelvic exams:  n/a. Any excessive radiation exposure/other exposures in the past:  Worked around chemicals at [employer] for 7 years  Past Medical History:  Diagnosis Date  . Allergic rhinitis   . Anemia, unspecified 05/30/2013  . Anxiety   . Breast cancer ([place]) 03/31/13   left  . Cancer ([place])   . Chest pain 2009   Consultation-[facility], negative chest CT; nl echo in 2005; h/o palpitations   . Colitis 2010   not IBD  . Degenerative joint disease    + degenerative joint disease of the lumbosacral spine  . Depression   . Diabetes ([place])   . Diabetes ([place])   . Dysrhythmia    palpations  . GERD (gastroesophageal reflux disease)    "a little"  . Headache   . Heart murmur    "small"  . Herpes simplex type II infection   . Hypercholesteremia    "slightly high"  . Hypertension    does not have high blood pressure  . 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 Palpitations   . Wears glasses     Past Surgical History:  Procedure Laterality Date  . ABDOMINAL HYSTERECTOMY  03/13/2007   TAH ?BSO--Dr Levin Bacon, [place]  . BREAST EXCISIONAL BIOPSY  04/2003   Left; benign disease  . BREAST LUMPECTOMY WITH NEEDLE LOCALIZATION AND AXILLARY LYMPH NODE DISSECTION Left 09/12/2013   Procedure: BREAST LUMPECTOMY WITH NEEDLE LOCALIZATION AND AXILLARY LYMPH NODE DISSECTION;  Surgeon: [name], MD;  Location: [facility];  Service: General;  Laterality: Left;  and axilla  . BREAST SURGERY    . COLONOSCOPY  10/2010   proctitis; melanosis coli  . EYE SURGERY Left    "fix lazy eye"  . PORTACATH PLACEMENT Right 04/21/2013   Procedure: INSERTION PORT-A-CATH;  Surgeon: [name], MD;  Location: [facility];  Service: General;  Laterality: Right;  .  RIGHT OOPHORECTOMY  03/13/2007  . SUBOCCIPITAL CRANIECTOMY CERVICAL LAMINECTOMY N/A 05/10/2014   Procedure: SUBOCCIPITAL CRANIECTOMY CERVICAL LAMINECTOMY/DURAPLASTY;  Surgeon: [name], MD;  Location: [facility];  Service: Neurosurgery;  Laterality: N/A;  suboccipital craniectomy with cervical laminectomy and duraplasty  . TUBAL LIGATION      Social History   Social History  . Marital status: Married    Spouse name: N/A  . Number of children: 4  . Years of education: N/A   Occupational History  . Factory work [employer]   Social History Main Topics  . Smoking status: Former Smoker    Packs/day: 0.50    Years: 15.00     Types: Cigarettes    Quit date: 07/06/1982  . Smokeless tobacco: Never Used  . Alcohol use No  . Drug use: No  . Sexual activity: Yes    Partners: Male    Birth control/ protection: Surgical     Comment: TAH   Other Topics Concern  . Not on file   Social History Narrative  . No narrative on file     FAMILY HISTORY:  We obtained a detailed, 4-generation family history.  Significant diagnoses are listed below: Family History  Problem Relation Age of Onset  . Aneurysm Mother     Cerebral  . Hypertension Mother   . Hyperlipidemia Mother   . Stroke Mother   . Coronary artery disease Father   . Hypertension Father   . Hyperlipidemia Father   . Heart attack Father     d. 20  . Lung cancer Father     dx early 29s; former smoker  . Lung cancer Brother   . Lung cancer Brother     d. 70y; smoker  . Lung cancer Sister     paternal half-sister; not a smoker; dx <60; d. 41y  . Leukemia Maternal Aunt     "blood cancer"; d. unspecified age  . Cervical cancer Paternal Aunt     d. late 21s  . Breast cancer Sister 24  . Other Sister     hx of hysterectomy for unspecified reason  . Multiple myeloma Brother     d. 21s  . Lung cancer Brother     dx late 55s; smoker  . Prostate cancer Brother     dx late 30s  . Prostate cancer Brother     dx late 68s  . Lung cancer Brother   . Lung cancer Maternal Aunt     d. unspecified age  . Breast cancer Maternal Aunt     dx unspecified age  . Prostate cancer Cousin     maternal 1st cousin; dx older age  . Breast cancer Other     niece dx early 40s or before  . Leukemia Other     nephew d. 2y; "blood cancer"  . Colon cancer Neg Hx   . Diabetes Neg Hx     Ms. Hodapp has three daughters and one son, ages 20-35.  She has five granddaughters and six grandsons.  She has five full brothers and three full sisters.  She also has three paternal half-sisters and one paternal half-brother.  One full sister was diagnosed with breast cancer at 30.   The other two full sisters are in their late 28s-60s and are cancer-free.  One full brother was a smoker and died of lung cancer at 91.  Another brother is also a smoker and was diagnosed with lung cancer in his late 75s.  One full brother died of multiple myeloma in his 77s.  One full brother was diagnosed with prostate cancer in his late 31s.  She has only one full brother who has never had cancer.  Her paternal half-brother was diagnosed with cancer in his late 60s and has also had lung cancer.  Her half-sister died of lung cancer in at 68. This half-sister has a daughter who was diagnosed with breast cancer in her early 61s or younger.  Ms. Muhlbauer has no further information for her other two paternal half-sisters.  One nephew died of a "blood cancer" at 70 years old.    Ms. Lucier's mother died of a stroke at 82 and never had cancer to her knowledge.  Ms. Exley mother had three full sisters and one full brother, all of whom have passed away before the patient was born.  She reports that one sister died of a blood cancer, one from lung cancer, and one had breast cancer.  Her uncle never had cancer to her knowledge.  Her aunt who died of breast cancer had a son who was diagnosed with prostate cancer at an older age.  Ms. Vice's maternal grandparents passed away before she was born, and she has no further information for any other maternal relatives.  Ms. Kreher's father had one full sister who died of cervical cancer in her late 53s.  She has no information for her paternal grandparents or for other paternal relatives.  She believes that her father had two uncles with colon polyps, but she has no further information for these relatives.  Ms. Gagan is unaware of any family history of genetic testing for hereditary cancer risks.  Patient's maternal ancestors are of [place] American descent, and paternal ancestors are of [place] American and Native American descent. There is no known reported  Ashkenazi Jewish ancestry. There is no known consanguinity.  GENETIC COUNSELING ASSESSMENT: Anasofia Micallef is a 55 y.o. female with a personal and family history of cancer which is somewhat suggestive of a hereditary breast cancer syndrome and predisposition to cancer. We, therefore, discussed and recommended the following at today's visit.   DISCUSSION: We reviewed the characteristics, features and inheritance patterns of hereditary cancer syndromes, particularly those caused by mutations within the BRCA1/2 genes. We also discussed genetic testing, including the appropriate family members to test, the process of testing, insurance coverage and turn-around-time for results. We discussed the implications of a negative, positive and/or variant of uncertain significant result. We recommended Ms. Douthitt pursue genetic testing for the 21-gene Custom Cancer Panel (20-gene Breast/Ovarian Cancer Panel plus HOXB13 gene) with MSH2 Exons 1-7 Inversion Analysis through [facility].  This Custom Panel offered by [facility] Hope Pigeon, MD) includes sequencing and/or deletion/duplication analysis for the following 21 genes:  ATM, BARD1, BRCA1, BRCA2, BRIP1, CDH1, CHEK2, EPCAM, FANCC, HOXB13, MLH1, MSH2, MSH6, NBN, PALB2, PMS2, PTEN, RAD51C, RAD51D, TP53, and XRCC2.     Based on Ms. Sebald's personal and family history of cancer, she meets medical criteria for genetic testing. Despite that she meets criteria, she may still have an out of pocket cost. We discussed that if her out of pocket cost for testing is over $100, the laboratory will call and confirm whether she wants to proceed with testing.  If the out of pocket cost of testing is less than $100 she will be billed by the genetic testing laboratory.   PLAN: After considering the risks, benefits, and limitations, Ms. Higinbotham  provided informed consent to pursue genetic testing  and the blood sample was sent to [facility] for analysis of  the 21-gene Custom Panel with MSH2 Exons 1-7 Inversion Analysis. Results should be available within approximately 2-3 weeks' time, at which point they will be disclosed by telephone to Ms. Farkas, as will any additional recommendations warranted by these results. Ms. Tatum will receive a summary of her genetic counseling visit and a copy of her results once available. This information will also be available in Epic. We encouraged Ms. Kyler to remain in contact with cancer genetics annually so that we can continuously update the family history and inform her of any changes in cancer genetics and testing that may be of benefit for her family. Ms. Noxon questions were answered to her satisfaction today. Our contact information was provided should additional questions or concerns arise.  Thank you for the referral and allowing [place] to share in the care of your patient.   Jeanine Luz, MS, [facility] Certified Genetic Counselor [email or web address] Phone: [phone]  The patient was seen for a total of 60 minutes in face-to-face genetic counseling.  This patient was discussed with Drs. Magrinat, Lindi Adie and/or Burr Medico who agrees with the above.    _______________________________________________________________________ For Office Staff:  Number of people involved in session: 2 Was an Intern/ student involved with case: no

## 2016-05-25 ENCOUNTER — Telehealth: Payer: Self-pay | Admitting: Family Medicine

## 2016-05-25 ENCOUNTER — Ambulatory Visit: Payer: BLUE CROSS/BLUE SHIELD

## 2016-05-25 ENCOUNTER — Other Ambulatory Visit: Payer: BLUE CROSS/BLUE SHIELD

## 2016-05-25 ENCOUNTER — Telehealth: Payer: Self-pay | Admitting: Genetic Counselor

## 2016-05-25 NOTE — Telephone Encounter (Signed)
Gunnar Fusi was notified and letterhead was faxed.

## 2016-05-25 NOTE — Telephone Encounter (Signed)
Patient dentist office called about on her teeth.She told them she had a heart murmur and they wanted to know of medication they should avoid giving patient

## 2016-05-25 NOTE — Telephone Encounter (Signed)
Called [name] [name] at [facility] office [phone]

## 2016-05-25 NOTE — Telephone Encounter (Signed)
Pt has normal flow murmur with excellent valve funtion on echocardiogram no need for  prophylsaxis

## 2016-05-25 NOTE — Telephone Encounter (Signed)
Saw [name] [name] last Thursday and forgot to get her to sign the front page of the test requisition.  We made a plan for me to email her with this and she will sign and send back tomorrow.

## 2016-06-01 ENCOUNTER — Ambulatory Visit (INDEPENDENT_AMBULATORY_CARE_PROVIDER_SITE_OTHER): Payer: BLUE CROSS/BLUE SHIELD | Admitting: Nurse Practitioner

## 2016-06-01 VITALS — BP 124/78 | HR 62 | Ht 60.5 in | Wt 162.2 lb

## 2016-06-01 DIAGNOSIS — E161 Other hypoglycemia: Secondary | ICD-10-CM

## 2016-06-01 DIAGNOSIS — K219 Gastro-esophageal reflux disease without esophagitis: Secondary | ICD-10-CM

## 2016-06-01 DIAGNOSIS — D171 Benign lipomatous neoplasm of skin and subcutaneous tissue of trunk: Secondary | ICD-10-CM

## 2016-06-01 NOTE — Patient Instructions (Signed)
Generic replens Samul Dada

## 2016-06-03 ENCOUNTER — Encounter: Payer: Self-pay | Admitting: Nurse Practitioner

## 2016-06-03 DIAGNOSIS — E161 Other hypoglycemia: Secondary | ICD-10-CM | POA: Insufficient documentation

## 2016-06-03 DIAGNOSIS — D171 Benign lipomatous neoplasm of skin and subcutaneous tissue of trunk: Secondary | ICD-10-CM | POA: Insufficient documentation

## 2016-06-03 NOTE — Progress Notes (Signed)
Subjective:  Presents for routine follow up. Reflux has been stable on Protonix BID. Bowels have been normal. Has a problem with blood sugar dropping depending on what she eats and timing. Mainly occurs if she has a meal high in simple carbs such as cereal with milk and a banana. No syncopal episodes. Also has noticed a nontender "swollen" area in the mid back; first noticed it a few weeks ago. Asymptomatic.   Objective:   BP 124/78   Pulse 62   Ht 5' 0.5" (1.537 m)   Wt 162 lb 3.2 oz (73.6 kg)   LMP 12/04/2008   SpO2 98%   BMI 31.16 kg/m  NAD. Alert, oriented. Lungs clear. Heart regular rate rhythm. Abdomen soft nondistended with distinct epigastric area tenderness. A slightly raised round soft fluctuant nonerythematous superficial mass noted in the middle of the back area approximately 4 cm in diameter. Nontender to palpation. Consistent with a superficial lipoma.  Assessment:  Problem List Items Addressed This Visit      Digestive   GERD (gastroesophageal reflux disease)   Reactive hypoglycemia - Primary     Other   Lipoma of back     Plan: Continue current medications as directed. Lengthy discussion regarding her diet and avoidance of hypoglycemia. Also reviewed symptomatic care for low blood sugar. Recommend referral to GI specialist the patient defers at this time. Also recommend chest x-ray to see if cyst in her back is solid but again patient defers for now. Call back if any further problems. Return in about 6 months (around 11/29/2016) for check up.

## 2016-06-04 ENCOUNTER — Other Ambulatory Visit: Payer: Self-pay | Admitting: Nurse Practitioner

## 2016-06-05 ENCOUNTER — Telehealth: Payer: Self-pay | Admitting: Nurse Practitioner

## 2016-06-05 ENCOUNTER — Other Ambulatory Visit: Payer: Self-pay

## 2016-06-05 MED ORDER — ATENOLOL 50 MG PO TABS: 50.0000 mg | ORAL_TABLET | Freq: Two times a day (BID) | ORAL | 5 refills | Status: DC

## 2016-06-05 NOTE — Telephone Encounter (Signed)
Pt needs refill on her  atenolol (TENORMIN) 50 MG tablet Last refill said "needs OV" and patient was seen here 06/01/16 by Hoyle Sauer   Please advise & call pt when done      [facility]

## 2016-06-05 NOTE — Telephone Encounter (Signed)
Left message informing patient that her requested medications was sent into her pharmacy.

## 2016-06-09 NOTE — Progress Notes (Signed)
Counseling intern left voicemail to offer emotional support.  Lamount Cohen, Counseling Intern Department for Spiritual Care and [facility] Supervisor - [place] [phone]

## 2016-06-25 ENCOUNTER — Telehealth: Payer: Self-pay | Admitting: Genetic Counselor

## 2016-06-25 NOTE — Telephone Encounter (Signed)
Discussed with Ms. Bankson that her genetic test result was negative for known pathogenic mutations within any of 21 genes on the Custom Panel through [employer].  One variant of uncertain significance (VUS) called "c.1243G>T (p.Val415Leu)" was found in one copy of the PMS2 gene.  Reviewed that we treat this just like a negative result, until it gets updated by the lab.  Encouraged her to keep her phone number up-to-date with [place], to follow her doctors recommendations, and encouraged further genetic testing of other relatives who have had breast or prostate cancers.  She would like a copy of her results emailed and mailed to her, and I am happy to do that.

## 2016-06-26 ENCOUNTER — Ambulatory Visit: Payer: Self-pay | Admitting: Genetic Counselor

## 2016-06-26 DIAGNOSIS — Z8042 Family history of malignant neoplasm of prostate: Secondary | ICD-10-CM

## 2016-06-26 DIAGNOSIS — Z1379 Encounter for other screening for genetic and chromosomal anomalies: Secondary | ICD-10-CM

## 2016-06-26 DIAGNOSIS — Z853 Personal history of malignant neoplasm of breast: Secondary | ICD-10-CM

## 2016-06-26 DIAGNOSIS — Z809 Family history of malignant neoplasm, unspecified: Secondary | ICD-10-CM

## 2016-06-26 DIAGNOSIS — Z803 Family history of malignant neoplasm of breast: Secondary | ICD-10-CM

## 2016-07-01 DIAGNOSIS — Z1379 Encounter for other screening for genetic and chromosomal anomalies: Secondary | ICD-10-CM | POA: Insufficient documentation

## 2016-07-01 DIAGNOSIS — O285 Abnormal chromosomal and genetic finding on antenatal screening of mother: Secondary | ICD-10-CM | POA: Insufficient documentation

## 2016-07-01 HISTORY — DX: Encounter for other screening for genetic and chromosomal anomalies: Z13.79

## 2016-07-01 NOTE — Progress Notes (Signed)
GENETIC TEST RESULT  HPI: Ms. [name] [name] was previously seen in the [facility] clinic due to a personal history of breast cancer, family history of breast, prostate, and other cancers, and concerns regarding a hereditary predisposition to cancer. Please refer to our prior cancer genetics clinic note from May 21, 2016 for more information regarding Ms. [name] [name]'s medical, social and family histories, and our assessment and recommendations, at the time. Ms. [name] [name] recent genetic test results were disclosed to her, as were recommendations warranted by these results. These results and recommendations are discussed in more detail below.  GENETIC TEST RESULTS: Genetic testing reported out on June 22, 2016 through the 21-gene Custom Cancer Panel through [facility] found no known deleterious mutations.  One variant of uncertain significance (VUS) called "c.1243G>T (p.Val415Leu)" was found in one copy of the PMS2 gene.  This 21-gene Custom Panel offered by [facility] [name] Pigeon, MD) includes sequencing and deletion/duplication analysis for the following genes:  ATM, BARD1, BRCA1, BRCA2, BRIP1, CDH1, CHEK2, EPCAM, FANCC, HOXB13, MLH1, MSH2, MSH6, NBN, PALB2, PMS2, PTEN, RAD51C, RAD51D, TP53, and XRCC2.  The test report will be scanned into EPIC and will be located under the Molecular Pathology section of the Results Review tab.   Genetic testing did identify a variant of uncertain significance (VUS) called "c.1243G>T (p.Val415Leu)" in one copy of the PMS2 gene. At this time, it is unknown if this VUS is associated with an increased risk for cancer or if this is a normal finding. Since this VUS result is uncertain, it cannot help guide screening recommendations, and family members should not be tested for this VUS to help define their own cancer risks.  Also, we all have variants within our genes that make [place] unique individuals--most of these variants are benign.  Thus, we  treat this VUS as a negative result until it gets updated by the lab.   With time, we suspect the lab will reclassify this variant and when they do, we will try to re-contact Ms. [name] [name] to discuss the reclassification further.  We also encouraged Ms. [name] [name] to contact us in a year or two to obtain an update on the status of this VUS.  We discussed with Ms. [name] [name] that since the current genetic testing is not perfect, it is possible there may be a gene mutation in one of these genes that current testing cannot detect, but that chance is small. We also discussed, that it is possible that another gene that has not yet been discovered, or that we have not yet tested, is responsible for the cancer diagnoses in the family, and it is, therefore, important to remain in touch with cancer genetics in the future so that we can continue to offer Ms. [name] [name] the most up-to-date genetic testing.    CANCER SCREENING RECOMMENDATIONS: We still do not have an explanation for Ms. [name] [name]'s personal and family history of cancer.  This result may be reassuring and indicate that Ms. [name] [name] likely does not have an increased risk for a future cancer due to a mutation in one of these genes. Most cancers happen by chance.  However, given Ms. [name] [name]'s personal and family histories, we must interpret these negative results with some caution.  Families with features suggestive of hereditary risk for cancer tend to have multiple family members with cancer, diagnoses in multiple generations and diagnoses before the age of 39. Ms. [name] [name]'s family exhibits some of these features. Thus this result may simply reflect our current inability to detect all  mutations within these genes or there may be a different gene that has not yet been discovered or tested. We recommend that other of her family members who have had prostate and breast cancers undergo genetic counseling and testing, as this may provide [place] with further information about the  personal and familial cancer risks.  In the meantime, Ms. [name] [name] should continue to follow her doctors' recommendations for future cancer screening.  These recommendations should take into account her personal and family history of cancer.    RECOMMENDATIONS FOR FAMILY MEMBERS: Men and women in this family might be at some increased risk of developing cancer, over the general population risk, simply due to the family history of cancer. We recommended women in this family have a yearly mammogram beginning at age 62, or 76 years younger than the earliest onset of cancer, an annual clinical breast exam, and perform monthly breast self-exams. Women in this family should also have a gynecological exam as recommended by their provider. Men in the family should discuss prostate cancer screening with their doctor beginning at the age of 9-45, or earlier, as recommended by a doctor.  All family members should have a colonoscopy by age 35.  Based on Ms. [name] [name]'s family history, we recommended her relatives, who have been diagnosed with breast or prostate cancers have genetic counseling and testing. Ms. [name] [name] will let us know if we can be of any assistance in coordinating genetic counseling and/or testing for these family members.   FOLLOW-UP: Lastly, we discussed with Ms. [name] [name] that cancer genetics is a rapidly advancing field and it is possible that new genetic tests will be appropriate for her and/or her family members in the future. We encouraged her to remain in contact with cancer genetics on an annual basis so we can update her personal and family histories and let her know of advances in cancer genetics that may benefit this family.   Our contact number was provided. Ms. [name] [name] questions were answered to her satisfaction, and she knows she is welcome to call us at anytime with additional questions or concerns.   Jeanine Luz, MS, [facility] Certified Genetic Counselor [email or web address] Phone:  [phone]

## 2016-07-02 ENCOUNTER — Telehealth: Payer: Self-pay | Admitting: Gastroenterology

## 2016-07-02 NOTE — Telephone Encounter (Signed)
Looks like she was given samples of Linzess 72 mcg on 05/13/2016 at her OV with Walden Field, NP.  I called and LMOM for a return call, to find out more, she was supposed to call in a week or so.

## 2016-07-02 NOTE — Telephone Encounter (Signed)
PATIENT CALLED STATING THAT LINZESS SAMPLES WERE TOO STRONG FOR HER, SHOULD SHE TRY SOMETHING OTC OR IS THERE SOMETHING ELSE SHE CAN TRY FROM HERE?  [phone]

## 2016-07-07 NOTE — Telephone Encounter (Signed)
PT is aware I have samples of the Amitiza 8 mcg #12 at front for her to pick up. She will call in a few days and let us know how it is doing.

## 2016-07-07 NOTE — Telephone Encounter (Signed)
We could do samples of Amitiza 8 mcg po BID with food. Further recommendations per Randall Hiss when he returns.

## 2016-07-07 NOTE — Telephone Encounter (Signed)
Pt said the Linzess 72 mcg was too strong. She has never tried Miralax. The only thing she had tried OTC was laxatives and enemas. Please advise!  Forwarding to Roseanne Kaufman, NP in [place] absence.

## 2016-07-07 NOTE — Telephone Encounter (Signed)
LM for pt to returncall

## 2016-07-15 ENCOUNTER — Ambulatory Visit (INDEPENDENT_AMBULATORY_CARE_PROVIDER_SITE_OTHER): Payer: BLUE CROSS/BLUE SHIELD | Admitting: Nurse Practitioner

## 2016-07-15 ENCOUNTER — Encounter: Payer: Self-pay | Admitting: Nurse Practitioner

## 2016-07-15 VITALS — BP 125/72 | HR 75 | Temp 98.5°F | Ht 61.0 in | Wt 160.2 lb

## 2016-07-15 DIAGNOSIS — K5900 Constipation, unspecified: Secondary | ICD-10-CM | POA: Diagnosis not present

## 2016-07-15 DIAGNOSIS — Z1379 Encounter for other screening for genetic and chromosomal anomalies: Secondary | ICD-10-CM

## 2016-07-15 NOTE — Progress Notes (Addendum)
REVIEWED-NO ADDITIONAL RECOMMENDATIONS.  Referring Provider: Mikey Kirschner, MD Primary Care Physician:  Mickie Hillier, MD Primary GI:  Dr. [name]  Chief Complaint  Patient presents with  . Nausea  . Emesis    HPI:   [name] [name] is a 56 y.o. female who presents for follow-up on constipation and GERD. The patient was last seen in our office Lennie Odor 02/23/2016 at which point she was doing well overall, still on Protonix and recent significant nausea and occasional vomiting. Noted black stools for 1 week on Pepto. Some abdominal cramping and bloating noted as well, was seen in the ER and diagnosed with constipation. Noted incomplete emptying with soft stools. CBC, CMP, lipase normal in the emergency department CT showing possible constipation. She was given Reglan by the emergency department which did not help her nausea. She was given samples of Linzess 72 g and a co-pay card, requested phone call with progress report in 1-2 weeks and two-month follow-up. The patient to call back stating that the Linzess 72 g were too strong. Recommended Amitiza 8 g with samples for her to pickup.  Colonoscopy up-to-date last completed 10/06/2010 for change in bowel habits. Findings include normal terminal ileum, normal colonoscopy except distal proctitis mild in nature, no changes or abnormalities otherwise. No overt recommendations for repeat exam but given no polyps or significant abnormalities likely ten-year repeat (2022.)  Today she states she's doing ok. Linzess overshot the end point with significant diarrhea. Has tried Amitiza 8 mcg which hasn't helped much. Still with some nausea, though described as "very little." She was recently diagnosed with Lynch syndrome and has provided a copy of her genetic testing results. Denies hematochezia and melena. Has a bowel movement about every 3-5 days, stools are not hard "but I just cant get it out." Admits bloating as well as mild nausea. No fever, chills,  significant abdominal pain; only mild cramping intermittently. Denies chest pain, dyspnea, dizziness, lightheadedness, syncope, near syncope. Denies any other upper or lower GI symptoms.  Past Medical History:  Diagnosis Date  . Allergic rhinitis   . Anemia, unspecified 05/30/2013  . Anxiety   . Breast cancer ([place]) 03/31/13   left  . Cancer ([place])   . Chest pain 2009   Consultation-[facility], negative chest CT; nl echo in 2005; h/o palpitations  . Colitis 2010   not IBD  . Degenerative joint disease    + degenerative joint disease of the lumbosacral spine  . Depression   . Diabetes ([place])   . Diabetes ([place])   . Dysrhythmia    palpations  . GERD (gastroesophageal reflux disease)    "a little"  . Headache   . Heart murmur    "small"  . Herpes simplex type II infection   . Hypercholesteremia    "slightly high"  . Hypertension    does not have high blood pressure  . 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 Palpitations   . Wears glasses     Past Surgical History:  Procedure Laterality Date  . ABDOMINAL HYSTERECTOMY  03/13/2007   TAH ?BSO--Dr Levin Bacon, [place]  . BREAST EXCISIONAL BIOPSY  04/2003   Left; benign disease  . BREAST LUMPECTOMY WITH NEEDLE LOCALIZATION AND AXILLARY LYMPH NODE DISSECTION Left 09/12/2013   Procedure: BREAST LUMPECTOMY WITH NEEDLE LOCALIZATION AND AXILLARY LYMPH NODE DISSECTION;  Surgeon: [name], MD;  Location: [facility];  Service: General;  Laterality: Left;  and axilla  . BREAST SURGERY    . COLONOSCOPY  10/2010  proctitis; melanosis coli  . EYE SURGERY Left    "fix lazy eye"  . PORTACATH PLACEMENT Right 04/21/2013   Procedure: INSERTION PORT-A-CATH;  Surgeon: [name], MD;  Location: [facility];  Service: General;  Laterality: Right;  . RIGHT OOPHORECTOMY  03/13/2007  . SUBOCCIPITAL CRANIECTOMY CERVICAL LAMINECTOMY N/A 05/10/2014   Procedure: SUBOCCIPITAL CRANIECTOMY CERVICAL LAMINECTOMY/DURAPLASTY;  Surgeon: [name], MD;   Location: [facility];  Service: Neurosurgery;  Laterality: N/A;  suboccipital craniectomy with cervical laminectomy and duraplasty  . TUBAL LIGATION      Current Outpatient Prescriptions  Medication Sig Dispense Refill  . acetaminophen (TYLENOL) 500 MG tablet Take 500 mg by mouth every 6 (six) hours as needed for moderate pain.     Marland Kitchen ALPRAZolam (XANAX) 1 MG tablet TAKE 1 TABLET BY MOUTH 3 TIMES A DAY AS NEEDED FOR ANXIETY 60 tablet 5  . atenolol (TENORMIN) 50 MG tablet Take 1 tablet (50 mg total) by mouth 2 (two) times daily. 60 tablet 5  . blood glucose meter kit and supplies KIT Dispense based on patient and insurance preference. Use as directed by physician. Diabetes type 2. E11.9 1 each 0  . buPROPion (WELLBUTRIN SR) 150 MG 12 hr tablet Take 1 tablet (150 mg total) by mouth 2 (two) times daily. 180 tablet 1  . Cholecalciferol (VITAMIN D PO) Take 1 tablet by mouth daily. Pt takes 1000 units daily.    . diphenhydramine-acetaminophen (TYLENOL PM) 25-500 MG TABS Take 2 tablets by mouth at bedtime.    Marland Kitchen escitalopram (LEXAPRO) 20 MG tablet TAKE 1 TABLET BY MOUTH EVERY DAY (Patient taking differently: Take 20 mg by mouth daily. ) 30 tablet 5  . metFORMIN (GLUCOPHAGE-XR) 500 MG 24 hr tablet Take 1 tablet (500 mg total) by mouth daily with breakfast. 30 tablet 11  . nabumetone (RELAFEN) 500 MG tablet Take 500 mg by mouth 2 (two) times daily.    . pantoprazole (PROTONIX) 40 MG tablet Take 1 tablet (40 mg total) by mouth 2 (two) times daily. 60 tablet 2  . pravastatin (PRAVACHOL) 40 MG tablet Take 1 tablet (40 mg total) by mouth daily. 90 tablet 1  . valACYclovir (VALTREX) 1000 MG tablet Take 1 tablet (1,000 mg total) by mouth daily. 30 tablet 5   No current facility-administered medications for this visit.     Allergies as of 07/15/2016 - Review Complete 07/15/2016  Allergen Reaction Noted  . Doxycycline Itching and Swelling 06/27/2012    Family History  Problem Relation Age of Onset  .  Aneurysm Mother     Cerebral  . Hypertension Mother   . Hyperlipidemia Mother   . Stroke Mother   . Coronary artery disease Father   . Hypertension Father   . Hyperlipidemia Father   . Heart attack Father     d. 70  . Lung cancer Father     dx early 59s; former smoker  . Lung cancer Brother   . Lung cancer Brother     d. 27y; smoker  . Lung cancer Sister     paternal half-sister; not a smoker; dx <60; d. 52y  . Leukemia Maternal Aunt     "blood cancer"; d. unspecified age  . Cervical cancer Paternal Aunt     d. late 74s  . Breast cancer Sister 72  . Other Sister     hx of hysterectomy for unspecified reason  . Multiple myeloma Brother     d. 1s  . Lung cancer Brother  dx late 63s; smoker  . Prostate cancer Brother     dx late 42s  . Prostate cancer Brother     dx late 35s  . Lung cancer Brother   . Lung cancer Maternal Aunt     d. unspecified age  . Breast cancer Maternal Aunt     dx unspecified age  . Prostate cancer Cousin     maternal 1st cousin; dx older age  . Breast cancer Other     niece dx early 54s or before  . Leukemia Other     nephew d. 2y; "blood cancer"  . Colon cancer Neg Hx   . Diabetes Neg Hx     Social History   Social History  . Marital status: Married    Spouse name: N/A  . Number of children: 4  . Years of education: N/A   Occupational History  . Factory work [employer]   Social History Main Topics  . Smoking status: Former Smoker    Packs/day: 0.50    Years: 15.00    Types: Cigarettes    Quit date: 07/06/1982  . Smokeless tobacco: Never Used  . Alcohol use No  . Drug use: No  . Sexual activity: Yes    Partners: Male    Birth control/ protection: Surgical     Comment: TAH   Other Topics Concern  . None   Social History Narrative  . None    Review of Systems: Complete ROS negative except as per HPI.   Physical Exam: BP 125/72   Pulse 75   Temp 98.5 F (36.9 C) (Oral)   Ht '5\' 1"'  (1.549 m)   Wt 160 lb  3.2 oz (72.7 kg)   LMP 12/04/2008   BMI 30.27 kg/m  General:   Alert and oriented. Pleasant and cooperative. Well-nourished and well-developed.  Eyes:  Without icterus, sclera clear and conjunctiva pink.  Ears:  Normal auditory acuity. Cardiovascular:  S1, S2 present without murmurs appreciated. Extremities without clubbing or edema. Respiratory:  Clear to auscultation bilaterally. No wheezes, rales, or rhonchi. No distress.  Gastrointestinal:  +BS, soft, and non-distended. Mild TTP left-sided. No HSM noted. No guarding or rebound. No masses appreciated.  Rectal:  Deferred  Musculoskalatal:  Symmetrical without gross deformities. Neurologic:  Alert and oriented x4;  grossly normal neurologically. Psych:  Alert and cooperative. Normal mood and affect. Heme/Lymph/Immune: No excessive bruising noted.    07/15/2016 10:36 AM   Disclaimer: This note was dictated with voice recognition software. Similar sounding words can inadvertently be transcribed and may not be corrected upon review.

## 2016-07-15 NOTE — Assessment & Plan Note (Addendum)
Continued constipation and associated mild abdominal tenderness on the left side, mild nausea, mild bloating. Stools are soft, but just difficult to pass. We'll have a bowel movement about every 3-5 days. Linzess worked to well, Amitiza 8 g not well enough. I will start her on Amitiza 24 g with samples for the last 2 weeks. She is to call for the progress report in 2 weeks and let us know if it is working. We can further manage medication changes over the phone. Return for follow-up in 3 months.

## 2016-07-15 NOTE — Assessment & Plan Note (Signed)
The patient provided results of genetic testing, essentially what is noted above. I will review this for any interactions related to GI concerns. This will help determine if she should have a colonoscopy sooner than previously recommended 10 years.

## 2016-07-15 NOTE — Progress Notes (Signed)
CC'ED TO PCP 

## 2016-07-15 NOTE — Telephone Encounter (Signed)
Noted  

## 2016-07-15 NOTE — Patient Instructions (Signed)
1. Change Amitiza to 24 g dosing. We will provide you with samples for about 2 weeks. 2. Call us in 1-2 weeks and let us know if it is working any better for you. 3. We will review your genetic testing and make recommendations based on those results. 4. Return for follow-up in 3 months.

## 2016-07-17 ENCOUNTER — Other Ambulatory Visit: Payer: Self-pay | Admitting: Family Medicine

## 2016-07-23 ENCOUNTER — Ambulatory Visit: Payer: BLUE CROSS/BLUE SHIELD | Admitting: Nutrition

## 2016-07-23 ENCOUNTER — Other Ambulatory Visit (HOSPITAL_BASED_OUTPATIENT_CLINIC_OR_DEPARTMENT_OTHER): Payer: BLUE CROSS/BLUE SHIELD

## 2016-07-23 ENCOUNTER — Ambulatory Visit (HOSPITAL_BASED_OUTPATIENT_CLINIC_OR_DEPARTMENT_OTHER): Payer: BLUE CROSS/BLUE SHIELD | Admitting: Oncology

## 2016-07-23 VITALS — BP 123/55 | HR 66 | Temp 98.2°F | Resp 18 | Ht 61.0 in | Wt 160.6 lb

## 2016-07-23 DIAGNOSIS — F419 Anxiety disorder, unspecified: Secondary | ICD-10-CM | POA: Diagnosis not present

## 2016-07-23 DIAGNOSIS — R21 Rash and other nonspecific skin eruption: Secondary | ICD-10-CM

## 2016-07-23 DIAGNOSIS — F329 Major depressive disorder, single episode, unspecified: Secondary | ICD-10-CM

## 2016-07-23 DIAGNOSIS — Z170 Estrogen receptor positive status [ER+]: Secondary | ICD-10-CM

## 2016-07-23 DIAGNOSIS — C50412 Malignant neoplasm of upper-outer quadrant of left female breast: Secondary | ICD-10-CM | POA: Diagnosis not present

## 2016-07-23 DIAGNOSIS — Z79811 Long term (current) use of aromatase inhibitors: Secondary | ICD-10-CM

## 2016-07-23 LAB — COMPREHENSIVE METABOLIC PANEL
ALT: 12 U/L (ref 0–55)
AST: 14 U/L (ref 5–34)
Albumin: 3.7 g/dL (ref 3.5–5.0)
Alkaline Phosphatase: 52 U/L (ref 40–150)
Anion Gap: 10 mEq/L (ref 3–11)
BUN: 10.6 mg/dL (ref 7.0–26.0)
CO2: 26 mEq/L (ref 22–29)
Calcium: 9.4 mg/dL (ref 8.4–10.4)
Chloride: 107 mEq/L (ref 98–109)
Creatinine: 0.9 mg/dL (ref 0.6–1.1)
EGFR: 88 mL/min/{1.73_m2} — ABNORMAL LOW (ref >90)
Glucose: 91 mg/dl (ref 70–140)
Potassium: 3.8 mEq/L (ref 3.5–5.1)
Sodium: 143 mEq/L (ref 136–145)
Total Bilirubin: 0.39 mg/dL (ref 0.20–1.20)
Total Protein: 6.7 g/dL (ref 6.4–8.3)

## 2016-07-23 LAB — CBC WITH DIFFERENTIAL/PLATELET
BASO%: 0.1 % (ref 0.0–2.0)
Basophils Absolute: 0 10*3/uL (ref 0.0–0.1)
EOS%: 1.1 % (ref 0.0–7.0)
Eosinophils Absolute: 0.1 10*3/uL (ref 0.0–0.5)
HCT: 35.1 % (ref 34.8–46.6)
HGB: 11.8 g/dL (ref 11.6–15.9)
LYMPH%: 31.7 % (ref 14.0–49.7)
MCH: 28.9 pg (ref 25.1–34.0)
MCHC: 33.6 g/dL (ref 31.5–36.0)
MCV: 85.8 fL (ref 79.5–101.0)
MONO#: 0.5 10*3/uL (ref 0.1–0.9)
MONO%: 6.4 % (ref 0.0–14.0)
NEUT#: 4.3 10*3/uL (ref 1.5–6.5)
NEUT%: 60.7 % (ref 38.4–76.8)
Platelets: 182 10*3/uL (ref 145–400)
RBC: 4.09 10*6/uL (ref 3.70–5.45)
RDW: 13.8 % (ref 11.2–14.5)
WBC: 7.1 10*3/uL (ref 3.9–10.3)
lymph#: 2.2 10*3/uL (ref 0.9–3.3)

## 2016-07-23 MED ORDER — TAMOXIFEN CITRATE 20 MG PO TABS: 20.0000 mg | ORAL_TABLET | Freq: Every day | ORAL | 12 refills | Status: AC

## 2016-07-23 NOTE — Progress Notes (Signed)
ID: [name] [name] OB: March 29, 1961  MR#: [id number]  IOE#:[id number]  PCP: Mickie Hillier, MD ; Pearson Forster, NP GYN:   Josefa Half, MD  SU: Alphonsa Overall, MD [place]:  Thea Silversmith, MD OTHER MD: Freida Busman, MD;  Neysa Bonito, MD, Sharman Crate MD  CHIEF COMPLAINT:  Left Breast Cancer  CURRENT TREATMENT: Tamoxifen  BREAST CANCER HISTORY: From the original intake note 04/12/2013:  [name] [name] palpated a mass in her left breast in late August 2014. She was already scheduled for screening mammography at [facility] and this was performed 03/13/2013. Indeed a possible mass was noted in the left breast and on 03/29/2013 the patient underwent diagnostic left mammography and left ultrasonography. This showed a 2.5 cm irregular mass in the upper outer quadrant of the left breast. This was palpable.by ultrasound there was a 1.6 cm irregular hypoechoic mass in the area in question, and in addition to enlarged left axillary lymph nodes were noted, the largest measuring 2.5 cm.  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  Bilateral breast MRI 04/11/2013 shows 3 abnormal enhancing masses in the upper outer quadrant of the left breast measuring altogether 6.4 cm. The largest separate mass measured 2.7 cm. There were abnormal or large lymph nodes in the left axilla. Ultrasound guided biopsy of the 2 smaller masses has been scheduled.  The patient's subsequent history is as detailed below   INTERVAL HISTORY: [name] [name] returns today for follow-up of her estrogen receptor positive breast cancer. We switched her to tamoxifen October 2017. She tells me she is taking this  medication although I don't see her on her list and she does not recall how much she paid for it. She tells me she is having no side effects from it and particularly no hot flashes or vaginal wetness.   EVIEW OF SYSTEMS: [name] [name] feels her left upper extremity and left chest wall are swollen. Even though they may not actually be swollen that is how they feel. The left breast especially laterally can be very sore and painful and they're shooting pain sometimes. She is having GI problems including constipation. She says the treatment for that is given her diarrhea. She is seeing gastroenterology regularly for this. She also has severe pain in her left hip. She has been told that this is bursitis. The pain can be unbearable at times. She has an appointment with orthopedics regarding this. She tells me that she has been found to have Lynch syndrome. I do not have that data in hand. She continues to have a rash under her right breast which is very itchy she says. Nothing has helped this including antifungal's and steroid creams. Her appetite is poor. She denies unusual headaches or visual changes. There's been no nausea or vomiting. She is anxious and depressed. The situation at home is as before. A detailed review of systems today was otherwise stable  PAST MEDICAL HISTORY: Past Medical History:  Diagnosis Date  . Allergic rhinitis   . Anemia, unspecified 05/30/2013  . Anxiety   . Breast cancer ([place]) 03/31/13   left  . Cancer ([place])   . Chest pain 2009   Consultation-[facility], negative  chest CT; nl echo in 2005; h/o palpitations  . Colitis 2010   not IBD  . Degenerative joint disease    + degenerative joint disease of the lumbosacral spine  . Depression   . Diabetes ([place])   . Diabetes ([place])   . Dysrhythmia    palpations  . GERD (gastroesophageal reflux disease)    "a little"  . Headache   . Heart murmur    "small"  . Herpes simplex type II infection   . Hypercholesteremia    "slightly high"   . Hypertension    does not have high blood pressure  . 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 Palpitations   . Wears glasses     PAST SURGICAL HISTORY: Past Surgical History:  Procedure Laterality Date  . ABDOMINAL HYSTERECTOMY  03/13/2007   TAH ?BSO--Dr Levin Bacon, [place]  . BREAST EXCISIONAL BIOPSY  04/2003   Left; benign disease  . BREAST LUMPECTOMY WITH NEEDLE LOCALIZATION AND AXILLARY LYMPH NODE DISSECTION Left 09/12/2013   Procedure: BREAST LUMPECTOMY WITH NEEDLE LOCALIZATION AND AXILLARY LYMPH NODE DISSECTION;  Surgeon: [name], MD;  Location: [facility];  Service: General;  Laterality: Left;  and axilla  . BREAST SURGERY    . COLONOSCOPY  10/2010   proctitis; melanosis coli  . EYE SURGERY Left    "fix lazy eye"  . PORTACATH PLACEMENT Right 04/21/2013   Procedure: INSERTION PORT-A-CATH;  Surgeon: [name], MD;  Location: [facility];  Service: General;  Laterality: Right;  . RIGHT OOPHORECTOMY  03/13/2007  . SUBOCCIPITAL CRANIECTOMY CERVICAL LAMINECTOMY N/A 05/10/2014   Procedure: SUBOCCIPITAL CRANIECTOMY CERVICAL LAMINECTOMY/DURAPLASTY;  Surgeon: [name], MD;  Location: [facility];  Service: Neurosurgery;  Laterality: N/A;  suboccipital craniectomy with cervical laminectomy and duraplasty  . TUBAL LIGATION      FAMILY HISTORY Family History  Problem Relation Age of Onset  . Aneurysm Mother     Cerebral  . Hypertension Mother   . Hyperlipidemia Mother   . Stroke Mother   . Coronary artery disease Father   . Hypertension Father   . Hyperlipidemia Father   . Heart attack Father     d. 12  . Lung cancer Father     dx early 37s; former smoker  . Lung cancer Brother   . Lung cancer Brother     d. 51y; smoker  . Lung cancer Sister     paternal half-sister; not a smoker; dx <60; d. 22y  . Leukemia Maternal Aunt     "blood cancer"; d. unspecified age  . Cervical cancer Paternal Aunt     d. late 65s  . Breast cancer Sister 48  . Other Sister      hx of hysterectomy for unspecified reason  . Multiple myeloma Brother     d. 48s  . Lung cancer Brother     dx late 80s; smoker  . Prostate cancer Brother     dx late 84s  . Prostate cancer Brother     dx late 68s  . Lung cancer Brother   . Lung cancer Maternal Aunt     d. unspecified age  . Breast cancer Maternal Aunt     dx unspecified age  . Prostate cancer Cousin     maternal 1st cousin; dx older age  . Breast cancer Other     niece dx early 72s or before  . Leukemia Other     nephew d. 2y; "blood cancer"  . Colon cancer  Neg Hx   . Diabetes Neg Hx   The patient's father died at the age of 37 from a myocardial infarction. The patient's mother died at the age of 73 from a stroke. The patient had 6 brothers, 2 sisters. One brother has a history of lung cancer in the setting of tobacco abuse. Another brother had multiple myeloma. One sister was diagnosed with breast cancer at the age of 33   GYNECOLOGIC HISTORY: (Updated 09/25/2013) Menarche age 25, first live birth age 58, the patient is [name] [name] P4. She status post abdominal hysterectomy with bilateral salpingo-oophorectomy. She never took hormone replacement.   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 Her husband [name] works in Charity fundraiser. Daughter [name] formerly lived at home with the patient with her 3 children, all under the age of 25; they have subsequently moved out. She is not employed. Son Lateya Dauria lives in [place] and is not employed. Daughter Maggie Schwalbe Dillard lives in [place] and is a Barrister's clerk. Daughter Ernst Spell is a Education officer, museum in [place]. The patient has 10 grandchildren. She attends [employer]     ADVANCED DIRECTIVES: Not in place   HEALTH MAINTENANCE:  (Updated 09/25/2013)  Social History  Substance Use Topics  . Smoking status: Former Smoker    Packs/day: 0.50    Years: 15.00     Types: Cigarettes    Quit date: 07/06/1982  . Smokeless tobacco: Never Used  . Alcohol use No     Colonoscopy:2012  PAP:2014   Bone density: Never  Lipid panel: Not on file    Allergies  Allergen Reactions  . Doxycycline Itching and Swelling    Current Outpatient Prescriptions  Medication Sig Dispense Refill  . acetaminophen (TYLENOL) 500 MG tablet Take 500 mg by mouth every 6 (six) hours as needed for moderate pain.     Marland Kitchen ALPRAZolam (XANAX) 1 MG tablet TAKE 1 TABLET BY MOUTH 3 TIMES A DAY AS NEEDED FOR ANXIETY 60 tablet 5  . atenolol (TENORMIN) 50 MG tablet Take 1 tablet (50 mg total) by mouth 2 (two) times daily. 60 tablet 5  . BAYER CONTOUR NEXT TEST test strip TEST ONCE DAILY AS DIRECTED 50 each 2  . blood glucose meter kit and supplies KIT Dispense based on patient and insurance preference. Use as directed by physician. Diabetes type 2. E11.9 1 each 0  . buPROPion (WELLBUTRIN SR) 150 MG 12 hr tablet Take 1 tablet (150 mg total) by mouth 2 (two) times daily. 180 tablet 1  . Cholecalciferol (VITAMIN D PO) Take 1 tablet by mouth daily. Pt takes 1000 units daily.    . diphenhydramine-acetaminophen (TYLENOL PM) 25-500 MG TABS Take 2 tablets by mouth at bedtime.    Marland Kitchen escitalopram (LEXAPRO) 20 MG tablet TAKE 1 TABLET BY MOUTH EVERY DAY (Patient taking differently: Take 20 mg by mouth daily. ) 30 tablet 5  . metFORMIN (GLUCOPHAGE-XR) 500 MG 24 hr tablet Take 1 tablet (500 mg total) by mouth daily with breakfast. 30 tablet 11  . nabumetone (RELAFEN) 500 MG tablet Take 500 mg by mouth 2 (two) times daily.    . pantoprazole (PROTONIX) 40 MG tablet Take 1 tablet (40 mg total) by mouth 2 (two) times daily. 60 tablet 2  . pravastatin (PRAVACHOL) 40 MG tablet Take 1 tablet (40 mg total) by mouth daily. 90 tablet 1  . valACYclovir (VALTREX) 1000 MG tablet  Take 1 tablet (1,000 mg total) by mouth daily. 30 tablet 5   No current facility-administered medications for this visit.     OBJECTIVE:  Middle-aged [place] American woman In no acute distress  Vitals:   07/23/16 1034  BP: (!) 123/55  Pulse: 66  Resp: 18  Temp: 98.2 F (36.8 C)  Body mass index is 30.35 kg/m.  ECOG: 1 Filed Weights   07/23/16 1034  Weight: 160 lb 9.6 oz (72.8 kg)   Sclerae unicteric, EOMs intact Oropharynx clear and moist No cervical or supraclavicular adenopathy Lungs no rales or rhonchi Heart regular rate and rhythm Abd soft, nontender, positive bowel sounds MSK no focal spinal tenderness, no left upper extremity lymphedema noted Neuro: nonfocal, well oriented, appropriate affect Breasts: The right breast is benign. The left breast is status post lumpectomy, then radiation, with no evidence of local recurrence. The left axilla is benign.  Skin: The skin in the right inframammary fold is hyperpigmented, consistent with prior infection, but does not show active disease.  LAB RESULTS:   Lab Results  Component Value Date   WBC 7.1 07/23/2016   NEUTROABS 4.3 07/23/2016   HGB 11.8 07/23/2016   HCT 35.1 07/23/2016   MCV 85.8 07/23/2016   PLT 182 07/23/2016      Chemistry      Component Value Date/Time   NA 143 07/23/2016 1018   K 3.8 07/23/2016 1018   CL 103 04/15/2016 1403   CO2 26 07/23/2016 1018   BUN 10.6 07/23/2016 1018   CREATININE 0.9 07/23/2016 1018      Component Value Date/Time   CALCIUM 9.4 07/23/2016 1018   ALKPHOS 52 07/23/2016 1018   AST 14 07/23/2016 1018   ALT 12 07/23/2016 1018   BILITOT 0.39 07/23/2016 1018         STUDIES: No results found.   ASSESSMENT/PLAN: 56 y.o. [name] [name], [name] [name] woman   (1)  status post left breast upper outer quadrant biopsy 03/29/2013 for a clinical T2-T3 pN1, stage IIB-IIIA invasive ductal carcinoma, grade 2-3, estrogen receptor 39% positive (with moderate staining intensity), progesterone receptor negative, with an MIB-1 of 83%, and HER-2 amplified by CISH, with a ratio of 7.5 and 15 HER-2 copies per cell  (2) neoadjuvant  chemotherapy, with trastuzumab/ pertuzumab/ carboplatin/ docetaxel repeated every 3 weeks x6, completed 08/21/2013. Pertuzumab  held after 4 cycles secondary to diarrhea. The patient received Neulasta on day 2 at [facility] per her request.   (3) Trastuzumab continued for total of one year (last dose 04/13/2014)). Most recent echo 06/04/2014 showed a well preserved ejection fraction.  (4)  status post left lumpectomy and axillary node dissection 09/12/2013, with a complete pathologic response (a total of 16 lymph nodes were examined,1 sentinel and 15 non-sentinel),  YpT0, ypN0.  (5) adjuvant radiation at [facility] completed 12/08/2013  (6) started anastrozole 01/16/2014  (a) bone density 04/09/2016 shows osteopenia with a T score of -1.7   (b) switched to tamoxifen as of OCT 2017 because of vaginal dryness issues   (i) status post remote total abdominal hysterectomy with bilateral salpingo-oophorectomy  (7)  genetics testing 06/22/2016 through the 21-gene Custom Cancer Panel through [facility], MD) found no deleterious mutations in ATM, BARD1, BRCA1, BRCA2, BRIP1, CDH1, CHEK2, EPCAM, FANCC, HOXB13, MLH1, MSH2, MSH6, NBN, PALB2, PMS2, PTEN, RAD51C, RAD51D, TP53, and XRCC2.    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    PLAN:  [name] [name] is now nearly 3 years out from definitive surgery for her breast cancer with no evidence of disease recurrence. This is very favorable.  She continues on tamoxifen, with fairly good tolerance. The plan is to continue that for a total of 5 years.  I am not sure why [name] [name] thinks that she has Lynch syndrome. I don't have evidence of that from our genetics testing. Possibly she has had genetics testing elsewhere. She says she has discussed this with her gastroenterologist.   She has significant comorbidities including depression, chronic pain in her left hip,  chronic GI problems, diabetes, as well as some social stressors. She is addressing these issues through her other physicians primarily. From a point of view of breast cancer her prognosis is good.  As far as the symptoms she has in her left chest wall, left shoulder and axilla, I think these are going to be mostly postop, but since she feels these are either new or more intense, they do warrant evaluation. I have set her up for a CT scan of the chest within the next week. I expect the results to be benign and we will call her with those results as soon as they become available  She will return to see me in November. She knows to call for any problems that may develop before that visit.   Chauncey Cruel, MD 07/23/2016 11:28 AM

## 2016-07-26 NOTE — Progress Notes (Deleted)
   Subjective:    Patient ID: [name] [name], female    DOB: October 03, 1960, 56 y.o.   MRN: [id number]  HPI Pt returns for f/u of diabetes mellitus: DM type: 2 Dx'ed: 0000000 Complications: none Therapy: metformin GDM: never DKA: never Severe hypoglycemia: never Pancreatitis: never Other: she also has h/o reactive hypoglycemia; she has never been on insulin. Interval history: She was seen in ER, for n/v.  Despite continuation of the metformin, sxs have resolved.    Review of Systems     Objective:   Physical Exam VITAL SIGNS:  See vs page GENERAL: no distress Pulses: dorsalis pedis intact bilat.   MSK: no deformity of the feet CV: no leg edema Skin:  no ulcer on the feet.  normal color and temp on the feet. Neuro: sensation is intact to touch on the feet.        Assessment & Plan:  Type 2 DM: apparently well-controlled.   Reactive hypoglycemia, better with metformin Obesity, not significantly changed. N/v, appears not related to metformin.  We'll continue same for now.

## 2016-07-29 ENCOUNTER — Other Ambulatory Visit: Payer: Self-pay | Admitting: Nurse Practitioner

## 2016-07-29 NOTE — Telephone Encounter (Signed)
Last OV 06/01/16, this was not addressed.  Last refill was 02/2016 +5. Please advise, apparently under treatment with oncology.

## 2016-07-31 ENCOUNTER — Ambulatory Visit (HOSPITAL_COMMUNITY)
Admission: RE | Admit: 2016-07-31 | Discharge: 2016-07-31 | Disposition: A | Discharge status: Home / Self Care | Payer: Medicaid Other | Source: Ambulatory Visit | Attending: Oncology | Admitting: Oncology

## 2016-07-31 ENCOUNTER — Ambulatory Visit: Payer: BLUE CROSS/BLUE SHIELD | Admitting: Endocrinology

## 2016-07-31 DIAGNOSIS — Z9889 Other specified postprocedural states: Secondary | ICD-10-CM | POA: Insufficient documentation

## 2016-07-31 DIAGNOSIS — Z170 Estrogen receptor positive status [ER+]: Secondary | ICD-10-CM | POA: Diagnosis present

## 2016-07-31 DIAGNOSIS — C50412 Malignant neoplasm of upper-outer quadrant of left female breast: Secondary | ICD-10-CM

## 2016-07-31 MED ORDER — IOPAMIDOL (ISOVUE-300) INJECTION 61%
75.0000 mL | Freq: Once | INTRAVENOUS | Status: AC | PRN
  Administered 2016-07-31: 75 mL via INTRAVENOUS

## 2016-08-02 ENCOUNTER — Encounter: Payer: Self-pay | Admitting: Oncology

## 2016-08-02 ENCOUNTER — Other Ambulatory Visit: Payer: Self-pay | Admitting: Oncology

## 2016-08-10 ENCOUNTER — Ambulatory Visit: Payer: BLUE CROSS/BLUE SHIELD | Admitting: Endocrinology

## 2016-08-11 ENCOUNTER — Telehealth: Payer: Self-pay | Admitting: Endocrinology

## 2016-08-11 NOTE — Telephone Encounter (Signed)
Follow up advised. Contact patient and schedule visit in 4 weeks 

## 2016-08-11 NOTE — Telephone Encounter (Signed)
Patient no showed today's appt. Please advise on how to follow up. °A. No follow up necessary. °B. Follow up urgent. Contact patient immediately. °C. Follow up necessary. Contact patient and schedule visit in ___ days. °D. Follow up advised. Contact patient and schedule visit in ____weeks. ° °

## 2016-08-13 NOTE — Telephone Encounter (Signed)
LVM for Pt to call and reschedule.

## 2016-08-24 ENCOUNTER — Other Ambulatory Visit: Payer: Self-pay | Admitting: Nurse Practitioner

## 2016-10-12 ENCOUNTER — Ambulatory Visit (INDEPENDENT_AMBULATORY_CARE_PROVIDER_SITE_OTHER): Payer: Self-pay | Admitting: Family Medicine

## 2016-10-12 ENCOUNTER — Encounter: Payer: Self-pay | Admitting: Family Medicine

## 2016-10-12 VITALS — BP 118/74 | Ht 61.0 in | Wt 161.0 lb

## 2016-10-12 DIAGNOSIS — F419 Anxiety disorder, unspecified: Secondary | ICD-10-CM

## 2016-10-12 DIAGNOSIS — D171 Benign lipomatous neoplasm of skin and subcutaneous tissue of trunk: Secondary | ICD-10-CM

## 2016-10-12 DIAGNOSIS — F329 Major depressive disorder, single episode, unspecified: Secondary | ICD-10-CM

## 2016-10-12 DIAGNOSIS — F32A Depression, unspecified: Secondary | ICD-10-CM

## 2016-10-12 MED ORDER — CITALOPRAM HYDROBROMIDE 40 MG PO TABS: ORAL_TABLET | ORAL | 1 refills | Status: DC

## 2016-10-12 NOTE — Progress Notes (Signed)
   Subjective:    Patient ID: [name] [name], female    DOB: 06-02-1961, 56 y.o.   MRN: [id number]  Back Pain  Episode onset: 3 months. The pain is present in the thoracic spine. Quality: dull ache, pressure.  Patient admits to increased stress and feeling down at times. No suicidal thoughts or homicidal thoughts. She stopped all of her antidepressant and antianxiety medications Bilateral arm pain. Right arm started about 3 months ago. Left arm started recently.   Has not been taking any of her meds besides atenolol due to her not having insurance. If she gets rx today would like to print it out to compare prices at [facility] [employer].   Review of Systems  Musculoskeletal: Positive for back pain.  No vomiting no diarrhea     Objective:   Physical Exam Alert vital stable positive impingement sign both shoulders lungs clear heart regular rhythm lipoma palpated on back.       Assessment & Plan:  Impression 1 lipoma on back with no insurance I support patient's desire to do no further testing at this time I really don't think she needs any further intervention in this area. #2 depression/anxiety stop Wellbutrin stop Xanax stop Lexapro. Start generic Celexa via [facility] plan #3 social situation discuss patient to work on free medicines through the health department plan diet exercise within limits discussed in encourage. Follow-up in 6 months WSL

## 2016-10-13 ENCOUNTER — Ambulatory Visit: Payer: BLUE CROSS/BLUE SHIELD | Admitting: Nurse Practitioner

## 2016-10-26 DIAGNOSIS — Z029 Encounter for administrative examinations, unspecified: Secondary | ICD-10-CM

## 2016-11-02 ENCOUNTER — Encounter: Payer: Self-pay | Admitting: Nurse Practitioner

## 2016-11-10 ENCOUNTER — Encounter: Payer: Self-pay | Admitting: Endocrinology

## 2016-11-10 ENCOUNTER — Ambulatory Visit (INDEPENDENT_AMBULATORY_CARE_PROVIDER_SITE_OTHER): Payer: Self-pay | Admitting: Endocrinology

## 2016-11-10 VITALS — BP 130/84 | HR 58 | Ht 61.0 in | Wt 155.0 lb

## 2016-11-10 DIAGNOSIS — E119 Type 2 diabetes mellitus without complications: Secondary | ICD-10-CM

## 2016-11-10 LAB — POCT GLYCOSYLATED HEMOGLOBIN (HGB A1C): Hemoglobin A1C: 5.6

## 2016-11-10 NOTE — Progress Notes (Signed)
Subjective:    Patient ID: [name] [name], female    DOB: Mar 08, 1961, 56 y.o.   MRN: [id number]  HPI Pt returns for f/u of diabetes mellitus: DM type: 2 Dx'ed: 7416 Complications: none Therapy: metformin GDM: never DKA: never Severe hypoglycemia: never Pancreatitis: never Other: she also has h/o reactive hypoglycemia; she has never been on insulin. Interval history: She takes metformin as rx'ed.  pt states she feels well in general.   Past Medical History:  Diagnosis Date  . Allergic rhinitis   . Anemia, unspecified 05/30/2013  . Anxiety   . Breast cancer ([place]) 03/31/13   left  . Cancer ([place])   . Chest pain 2009   Consultation-[facility]oth[facility], negative chest CT; nl echo in 2005; h/o palpitations  . Colitis 2010   not IBD  . Degenerative joint disease    + degenerative joint disease of the lumbosacral spine  . Depression   . Diabetes ([place])   . Diabetes ([place])   . Dysrhythmia    palpations  . GERD (gastroesophageal reflux disease)    "a little"  . Headache   . Heart murmur    "small"  . Herpes simplex type II infection   . Hypercholesteremia    "slightly high"  . Hypertension    does not have high blood pressure  . 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 Palpitations   . Wears glasses     Past Surgical History:  Procedure Laterality Date  . ABDOMINAL HYSTERECTOMY  03/13/2007   TAH ?BSO--Dr Levin Bacon, [place]  . BREAST EXCISIONAL BIOPSY  04/2003   Left; benign disease  . BREAST LUMPECTOMY WITH NEEDLE LOCALIZATION AND AXILLARY LYMPH NODE DISSECTION Left 09/12/2013   Procedure: BREAST LUMPECTOMY WITH NEEDLE LOCALIZATION AND AXILLARY LYMPH NODE DISSECTION;  Surgeon: [name], MD;  Location: [facility];  Service: General;  Laterality: Left;  and axilla  . BREAST SURGERY    . COLONOSCOPY  10/2010   proctitis; melanosis coli  . EYE SURGERY Left    "fix lazy eye"  . PORTACATH PLACEMENT Right 04/21/2013   Procedure: INSERTION PORT-A-CATH;  Surgeon: [name],  MD;  Location: [facility];  Service: General;  Laterality: Right;  . RIGHT OOPHORECTOMY  03/13/2007  . SUBOCCIPITAL CRANIECTOMY CERVICAL LAMINECTOMY N/A 05/10/2014   Procedure: SUBOCCIPITAL CRANIECTOMY CERVICAL LAMINECTOMY/DURAPLASTY;  Surgeon: [name], MD;  Location: [facility];  Service: Neurosurgery;  Laterality: N/A;  suboccipital craniectomy with cervical laminectomy and duraplasty  . TUBAL LIGATION      Social History   Social History  . Marital status: Married    Spouse name: N/A  . Number of children: 4  . Years of education: N/A   Occupational History  . Factory work [employer]   Social History Main Topics  . Smoking status: Former Smoker    Packs/day: 0.50    Years: 15.00    Types: Cigarettes    Quit date: 07/06/1982  . Smokeless tobacco: Never Used  . Alcohol use No  . Drug use: No  . Sexual activity: Yes    Partners: Male    Birth control/ protection: Surgical     Comment: TAH   Other Topics Concern  . Not on file   Social History Narrative  . No narrative on file    Current Outpatient Prescriptions on File Prior to Visit  Medication Sig Dispense Refill  . acetaminophen (TYLENOL) 500 MG tablet Take 500 mg by mouth every 6 (six) hours as needed for moderate  pain.     Marland Kitchen ALPRAZolam (XANAX) 1 MG tablet TAKE 1 TABLET BY MOUTH 3 TIMES A DAY AS NEEDED FOR ANXIETY 60 tablet 5  . atenolol (TENORMIN) 50 MG tablet Take 1 tablet (50 mg total) by mouth 2 (two) times daily. 60 tablet 5  . BAYER CONTOUR NEXT TEST test strip TEST ONCE DAILY AS DIRECTED 50 each 2  . blood glucose meter kit and supplies KIT Dispense based on patient and insurance preference. Use as directed by physician. Diabetes type 2. E11.9 1 each 0  . Cholecalciferol (VITAMIN D PO) Take 1 tablet by mouth daily. Pt takes 1000 units daily.    . citalopram (CELEXA) 40 MG tablet 1/2 tablet every morning for 5 days the one tablet every morning 90 tablet 1  . diphenhydramine-acetaminophen (TYLENOL  PM) 25-500 MG TABS Take 2 tablets by mouth at bedtime.    . metFORMIN (GLUCOPHAGE-XR) 500 MG 24 hr tablet Take 1 tablet (500 mg total) by mouth daily with breakfast. 30 tablet 11  . nabumetone (RELAFEN) 500 MG tablet Take 500 mg by mouth 2 (two) times daily.    . pantoprazole (PROTONIX) 40 MG tablet Take 1 tablet (40 mg total) by mouth 2 (two) times daily. 60 tablet 2  . pravastatin (PRAVACHOL) 40 MG tablet TAKE 1 TABLET (40 MG TOTAL) BY MOUTH DAILY. 90 tablet 1  . valACYclovir (VALTREX) 1000 MG tablet TAKE 1 TABLET (1,000 MG TOTAL) BY MOUTH DAILY. 30 tablet 2  . [DISCONTINUED] potassium chloride SA (K-DUR,KLOR-CON) 20 MEQ tablet Take 1 tablet (20 mEq total) by mouth 2 (two) times daily. 20 tablet 1   No current facility-administered medications on file prior to visit.     Allergies  Allergen Reactions  . Doxycycline Itching and Swelling    Family History  Problem Relation Age of Onset  . Aneurysm Mother     Cerebral  . Hypertension Mother   . Hyperlipidemia Mother   . Stroke Mother   . Coronary artery disease Father   . Hypertension Father   . Hyperlipidemia Father   . Heart attack Father     d. 32  . Lung cancer Father     dx early 33s; former smoker  . Lung cancer Brother   . Lung cancer Brother     d. 45y; smoker  . Lung cancer Sister     paternal half-sister; not a smoker; dx <60; d. 52y  . Leukemia Maternal Aunt     "blood cancer"; d. unspecified age  . Cervical cancer Paternal Aunt     d. late 77s  . Breast cancer Sister 41  . Other Sister     hx of hysterectomy for unspecified reason  . Multiple myeloma Brother     d. 14s  . Lung cancer Brother     dx late 67s; smoker  . Prostate cancer Brother     dx late 59s  . Prostate cancer Brother     dx late 65s  . Lung cancer Brother   . Lung cancer Maternal Aunt     d. unspecified age  . Breast cancer Maternal Aunt     dx unspecified age  . Prostate cancer Cousin     maternal 1st cousin; dx older age  . Breast  cancer Other     niece dx early 35s or before  . Leukemia Other     nephew d. 2y; "blood cancer"  . Colon cancer Neg Hx   . Diabetes Neg Hx  BP 130/84   Pulse (!) 58   Ht _0  (1.549 m)   Wt 155 lb (70.3 kg)   LMP 12/04/2008   SpO2 98%   BMI 29.29 kg/m    Review of Systems Denies n/v/hypoglycemia.      Objective:   Physical Exam VITAL SIGNS:  See vs page GENERAL: no distress Pulses: dorsalis pedis intact bilat.   MSK: no deformity of the feet CV: no leg edema Skin:  no ulcer on the feet.  normal color and temp on the feet. Neuro: sensation is intact to touch on the feet.   a1c=5.6%    Assessment & Plan:  Type 2 DM: well-controlled Reactive hypoglycemia: well-controlled   Patient Instructions  check your blood sugar once a week.  vary the time of day when you check, between before the 3 meals, and at bedtime.  also check if you have symptoms of your blood sugar being too high or too low.  please keep a record of the readings and bring it to your next appointment here (or you can bring the meter itself).  You can write it on any piece of paper.  please call us sooner if your blood sugar goes below 70, or if you have a lot of readings over 200.   Please continue the same metformin.  Please come back for a follow-up appointment in 6 months.

## 2016-11-10 NOTE — Patient Instructions (Addendum)
check your blood sugar once a week.  vary the time of day when you check, between before the 3 meals, and at bedtime.  also check if you have symptoms of your blood sugar being too high or too low.  please keep a record of the readings and bring it to your next appointment here (or you can bring the meter itself).  You can write it on any piece of paper.  please call us sooner if your blood sugar goes below 70, or if you have a lot of readings over 200.   Please continue the same metformin.  Please come back for a follow-up appointment in 6 months.

## 2016-11-19 ENCOUNTER — Encounter: Payer: Self-pay | Admitting: Obstetrics and Gynecology

## 2016-11-19 ENCOUNTER — Ambulatory Visit (INDEPENDENT_AMBULATORY_CARE_PROVIDER_SITE_OTHER): Payer: Medicaid Other | Admitting: Obstetrics and Gynecology

## 2016-11-19 ENCOUNTER — Ambulatory Visit (INDEPENDENT_AMBULATORY_CARE_PROVIDER_SITE_OTHER): Payer: Self-pay | Admitting: Nurse Practitioner

## 2016-11-19 ENCOUNTER — Encounter: Payer: Self-pay | Admitting: Nurse Practitioner

## 2016-11-19 VITALS — BP 128/78 | HR 75 | Wt 154.0 lb

## 2016-11-19 VITALS — BP 140/75 | HR 52 | Temp 97.7°F | Ht 60.0 in | Wt 152.0 lb

## 2016-11-19 DIAGNOSIS — N898 Other specified noninflammatory disorders of vagina: Secondary | ICD-10-CM | POA: Diagnosis not present

## 2016-11-19 DIAGNOSIS — R102 Pelvic and perineal pain: Secondary | ICD-10-CM

## 2016-11-19 DIAGNOSIS — K5900 Constipation, unspecified: Secondary | ICD-10-CM

## 2016-11-19 LAB — POCT URINALYSIS DIPSTICK
Blood, UA: NEGATIVE
Glucose, UA: NEGATIVE
Ketones, UA: NEGATIVE
Leukocytes, UA: NEGATIVE
Nitrite, UA: NEGATIVE
Protein, UA: NEGATIVE

## 2016-11-19 LAB — POCT WET PREP (WET MOUNT): Trichomonas Wet Prep HPF POC: ABSENT

## 2016-11-19 MED ORDER — LUBIPROSTONE 24 MCG PO CAPS: 24.0000 ug | ORAL_CAPSULE | Freq: Every day | ORAL | 1 refills | Status: DC

## 2016-11-19 NOTE — Progress Notes (Addendum)
REVIEWED-NO ADDITIONAL RECOMMENDATIONS.  Referring Provider: Mikey Kirschner, MD Primary Care Physician:  Mikey Kirschner, MD Primary GI:  Dr. [name]  Chief Complaint  Patient presents with  . Constipation    HPI:   [name] [name] is a 56 y.o. female who presents for follow-up on constipation. The patient was last seen in our office 07/15/2016. Colonoscopy on file 2012 and initially recommended 10 year repeat exam. At that time it was noted Linzess caused loose stools, Amitiza 8 g hadn't helped much and still having some nausea and constipation. Recently diagnosed with Lynch syndrome. Bowel movement every 3-5 days, difficulty with complete emptying and associated bloating. Recommended Amitiza 24 g dosing. Forthcoming recommendations related to genetic testing results.  Oncology note reviewed 07/23/2016 at which point it is stated "I am not sure why Latresa thinks that she has Lynch syndrome. I don't have evidence of that from our genetics testing. Possibly she has had genetics testing elsewhere. She says she has discussed this with her gastroenterologist. "  Today she states she's still constipated and has a bowel movement about twice a week. Stool is not hard but "I just can't push." Amitiza 24 mcg bid worked too well, 8 mcg not well enough, Linzess worked too well. No straining, hematochezia, melena. Notes subjective unintentional weight loss of "maybe 5, 6, 7, 8 pounds." Objectively in January she was 160 lb and today she is 152 lb. Did have fever a couple weeks ago. Denies chest pain, dyspnea, dizziness, lightheadedness, syncope, near syncope. Denies any other upper or lower GI symptoms.  Past Medical History:  Diagnosis Date  . Allergic rhinitis   . Anemia, unspecified 05/30/2013  . Anxiety   . Breast cancer ([place]) 03/31/13   left  . Cancer ([place])   . Chest pain 2009   Consultation-[facility], negative chest CT; nl echo in 2005; h/o palpitations  . Colitis 2010   not IBD  .  Degenerative joint disease    + degenerative joint disease of the lumbosacral spine  . Depression   . Diabetes ([place])   . Diabetes ([place])   . Dysrhythmia    palpations  . GERD (gastroesophageal reflux disease)    "a little"  . Headache   . Heart murmur    "small"  . Herpes simplex type II infection   . Hypercholesteremia    "slightly high"  . Hypertension    does not have high blood pressure  . 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 Palpitations   . Wears glasses     Past Surgical History:  Procedure Laterality Date  . ABDOMINAL HYSTERECTOMY  03/13/2007   TAH ?BSO--Dr Levin Bacon, [place]  . BREAST EXCISIONAL BIOPSY  04/2003   Left; benign disease  . BREAST LUMPECTOMY WITH NEEDLE LOCALIZATION AND AXILLARY LYMPH NODE DISSECTION Left 09/12/2013   Procedure: BREAST LUMPECTOMY WITH NEEDLE LOCALIZATION AND AXILLARY LYMPH NODE DISSECTION;  Surgeon: [name], MD;  Location: [facility];  Service: General;  Laterality: Left;  and axilla  . BREAST SURGERY    . COLONOSCOPY  10/2010   proctitis; melanosis coli  . EYE SURGERY Left    "fix lazy eye"  . PORTACATH PLACEMENT Right 04/21/2013   Procedure: INSERTION PORT-A-CATH;  Surgeon: [name], MD;  [facility];  Service: General;  Laterality: Right;  . RIGHT OOPHORECTOMY  03/13/2007  . SUBOCCIPITAL CRANIECTOMY CERVICAL LAMINECTOMY N/A 05/10/2014   Procedure: SUBOCCIPITAL CRANIECTOMY CERVICAL LAMINECTOMY/DURAPLASTY;  Surgeon: [name], MD;  Location: [facility];  Service: Neurosurgery;  Laterality: N/A;  suboccipital craniectomy with cervical laminectomy and duraplasty  . TUBAL LIGATION      Current Outpatient Prescriptions  Medication Sig Dispense Refill  . acetaminophen (TYLENOL) 500 MG tablet Take 500 mg by mouth every 6 (six) hours as needed for moderate pain.     Marland Kitchen ALPRAZolam (XANAX) 1 MG tablet TAKE 1 TABLET BY MOUTH 3 TIMES A DAY AS NEEDED FOR ANXIETY 60 tablet 5  . atenolol (TENORMIN) 50 MG tablet Take 1  tablet (50 mg total) by mouth 2 (two) times daily. 60 tablet 5  . BAYER CONTOUR NEXT TEST test strip TEST ONCE DAILY AS DIRECTED 50 each 2  . blood glucose meter kit and supplies KIT Dispense based on patient and insurance preference. Use as directed by physician. Diabetes type 2. E11.9 1 each 0  . Cholecalciferol (VITAMIN D PO) Take 1 tablet by mouth daily. Pt takes 1000 units daily.    . citalopram (CELEXA) 40 MG tablet 1/2 tablet every morning for 5 days the one tablet every morning 90 tablet 1  . diphenhydramine-acetaminophen (TYLENOL PM) 25-500 MG TABS Take 2 tablets by mouth at bedtime.    . metFORMIN (GLUCOPHAGE-XR) 500 MG 24 hr tablet Take 1 tablet (500 mg total) by mouth daily with breakfast. 30 tablet 11  . nabumetone (RELAFEN) 500 MG tablet Take 500 mg by mouth 2 (two) times daily.    . pantoprazole (PROTONIX) 40 MG tablet Take 1 tablet (40 mg total) by mouth 2 (two) times daily. 60 tablet 2  . pravastatin (PRAVACHOL) 40 MG tablet TAKE 1 TABLET (40 MG TOTAL) BY MOUTH DAILY. 90 tablet 1  . valACYclovir (VALTREX) 1000 MG tablet TAKE 1 TABLET (1,000 MG TOTAL) BY MOUTH DAILY. 30 tablet 2   No current facility-administered medications for this visit.     Allergies as of 11/19/2016 - Review Complete 11/19/2016  Allergen Reaction Noted  . Doxycycline Itching and Swelling 06/27/2012    Family History  Problem Relation Age of Onset  . Aneurysm Mother        Cerebral  . Hypertension Mother   . Hyperlipidemia Mother   . Stroke Mother   . Coronary artery disease Father   . Hypertension Father   . Hyperlipidemia Father   . Heart attack Father        d. 34  . Lung cancer Father        dx early 29s; former smoker  . Lung cancer Brother   . Lung cancer Brother        d. 47y; smoker  . Lung cancer Sister        paternal half-sister; not a smoker; dx <60; d. 73y  . Leukemia Maternal Aunt        "blood cancer"; d. unspecified age  . Cervical cancer Paternal Aunt        d. late 36s    . Breast cancer Sister 74  . Other Sister        hx of hysterectomy for unspecified reason  . Multiple myeloma Brother        d. 59s  . Lung cancer Brother        dx late 64s; smoker  . Prostate cancer Brother        dx late 31s  . Prostate cancer Brother        dx late 67s  . Lung cancer Brother   . Lung cancer Maternal Aunt  d. unspecified age  . Breast cancer Maternal Aunt        dx unspecified age  . Prostate cancer Cousin        maternal 1st cousin; dx older age  . Breast cancer Other        niece dx early 57s or before  . Leukemia Other        nephew d. 2y; "blood cancer"  . Colon cancer Neg Hx   . Diabetes Neg Hx     Social History   Social History  . Marital status: Married    Spouse name: N/A  . Number of children: 4  . Years of education: N/A   Occupational History  . Factory work [employer]   Social History Main Topics  . Smoking status: Former Smoker    Packs/day: 0.50    Years: 15.00    Types: Cigarettes    Quit date: 07/06/1982  . Smokeless tobacco: Never Used  . Alcohol use No  . Drug use: No  . Sexual activity: Yes    Partners: Male    Birth control/ protection: Surgical     Comment: TAH   Other Topics Concern  . Not on file   Social History Narrative  . No narrative on file    Review of Systems: Complete ROS negative except as per HPI.   Physical Exam: BP 140/75   Pulse (!) 52   Temp 97.7 F (36.5 C) (Oral)   Ht 5' (1.524 m)   Wt 152 lb (68.9 kg)   LMP 12/04/2008   BMI 29.69 kg/m  General:   Alert and oriented. Pleasant and cooperative. Well-nourished and well-developed.  Eyes:  Without icterus, sclera clear and conjunctiva pink.  Ears:  Normal auditory acuity. Cardiovascular:  S1, S2 present without murmurs appreciated. Extremities without clubbing or edema. Respiratory:  Clear to auscultation bilaterally. No wheezes, rales, or rhonchi. No distress.  Gastrointestinal:  +BS, soft, and non-distended. Lower  abdominal TTP. No HSM noted. No guarding or rebound. No masses appreciated.  Rectal:  Deferred  Musculoskalatal:  Symmetrical without gross deformities. Neurologic:  Alert and oriented x4;  grossly normal neurologically. Psych:  Alert and cooperative. Normal mood and affect. Heme/Lymph/Immune: No excessive bruising noted.    11/19/2016 11:35 AM   Disclaimer: This note was dictated with voice recognition software. Similar sounding words can inadvertently be transcribed and may not be corrected upon review.

## 2016-11-19 NOTE — Patient Instructions (Signed)
1. I will give you samples of Amitiza 24 g. Take this once a day with a full stomach. 2. Call us in 1-2 weeks and let us know how it is working for you. 3. Return for follow-up in 6 weeks. 4. Call if any worsening or severe symptoms before then.

## 2016-11-19 NOTE — Assessment & Plan Note (Addendum)
The patient has lots of vague symptoms suggestive of functional constipation. Has a bowel movement every few days, not overtly hard stools but difficult to pass and does not empty completely. Amitiza 8 g did not work well enough, Linzess work to well, Amitiza 24 g twice daily work to well. At this point I will try to find a happy medium with Amitiza 24 g once a day. She states that her symptoms of "working too well" did not occur with a single dose but after a period of time of taking it. This should help mitigate over shooting her symptoms. I will have her call with a progress report in 2 weeks and we'll provide samples to last 2 weeks. Once her constipation is better controlled and will allow [place] to see how other symptoms including bloating, "rumbling stomach" and difficulty passing stools play out. I will have her come back in 6 weeks for follow-up.  Recent CT on file without noted abnormality. Did note excessive stool burden consistent with constipation.  Does not appear to officially have Lynch syndrome. Nonpathological variant found on her genetic testing and this was discussed with her again today. No need for annual to every 2 year colonoscopy at this time. Her last colonoscopy is on file and she is up-to-date.

## 2016-11-19 NOTE — Addendum Note (Signed)
Addended by: Gordy Levan, Ronak Duquette A on: 11/19/2016 11:44 AM   Modules accepted: Orders

## 2016-11-19 NOTE — Progress Notes (Signed)
Patient ID: [name] [name], female   DOB: 06-06-1961, 56 y.o.   MRN: [id number]   [facility] Visit  _0 @            Patient name: [name] MRN [id number]  Date of birth: 1961/01/14  CC & HPI:   Chief Complaint  Patient presents with   Vaginal Discharge     [name] [name] is a 56 y.o. female presenting today for evaluation of intermittent vaginal pressure with b/l lower quadrant pain for a week at a time.   Pt was evaluated for possible RV fistula in 2014, but did not f/u d/t her breast cancer diagnosis. She states when she tries to hold her bowels or strains, she intermittently experiences fecal matter coming out of the vagina.   She also reports dyspareunia.   ROS:  ROS +vaginal pressure  +abdominal pain  +dyspareunia See note from 2014 by Dr Johney Maine, gen surgery    Assessment:     Intermittent feculent drainage per vagina suspicious for rectovaginal fistula.  Proximal location most likely     Plan:     I reviewed her numerous studies and prior notes from other consultants.  I had a long discussion with the patient.  Discussed with the colorectal partner, Dr. Leighton Ruff.  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  I suspect it is more proximal at the vaginal cuff.  I need evidence objectively of this.  It is always a challenge to do it endoscopically/by physical exam.  I discussed with the radiologist, Dr. Alfred Levins.  I think a water-soluble contrast in the rectum with a CT scan would give [place] the best 3-D picture of this area over a regular simple barium enema.  If that fails, consider trying to set up a methylene blue enema was clean tampon in the vagina to see if there is any spillage.  Backup plan OR examination under anesthesia with rigid proctoscopy.   If such a fistula is confirmed, then the patient would benefit from low anterior rectosigmoid resection, vaginal repair, omental patching of vaginal repair.  This would be the best  technique to excise the fistula and prevent recurrence:  The anatomy & physiology of the digestive tract was discussed.  The pathophysiology of rectovaginal fistula was discussed.  Natural history risks without surgery was discussed. I worked to give an overview of the disease and the frequent need to have multispecialty involvement.   I feel the risks of no intervention will lead to serious problems that outweigh the operative risks; therefore, I recommended surgery to treat the pathology.  Perineal approach with advancement flap for low fistulas was discussed.   Laparoscopic & open techniques for possible partial proctocolectomy for higher rectovaginal fistulas were discussed.  Repair of the vaginal opening with possible need for intervening material to prevent recurrence such as omentum, muscle, mesh, etc was discussed.  Possible fecal diversion by ostomy was discussed.  We will work to preserve anal & pelvic floor function without sacrificing cure.  Risks such as bleeding, infection, abscess, leak, recurrence with reoperation, possible ostomy, hernia, heart attack, death, and other risks were discussed.  I noted a good likelihood this will help address the problem.   Goals of post-operative recovery were discussed as well.  We will work to minimize complications.  An educational handout on the pathology was given as well.  Questions were answered.    The patient expresses understanding.   Pertinent History Reviewed:   Reviewed: Significant for  breast cancer, abdominal hysterectomy ~2007-2008, tubal ligation, right oophorectomy, breast lumpectomy  Medical         Past Medical History:  Diagnosis Date   Allergic rhinitis    Anemia, unspecified 05/30/2013   Anxiety    Breast cancer ([place]) 03/31/13   left   Cancer ([place])    Chest pain 2009   Consultation-[facility], negative chest CT; nl echo in 2005; h/o palpitations   Colitis 2010   not IBD   Degenerative joint disease    +  degenerative joint disease of the lumbosacral spine   Depression    Diabetes ([place])    Diabetes ([place])    Dysrhythmia    palpations   GERD (gastroesophageal reflux disease)    "a little"   Headache    Heart murmur    "small"   Herpes simplex type II infection    Hypercholesteremia    "slightly high"   Hypertension    does not have high blood pressure   Meningitis 08/22/2014   Palpitations    Wears glasses                               Surgical Hx:    Past Surgical History:  Procedure Laterality Date   ABDOMINAL HYSTERECTOMY  03/13/2007   TAH ?BSO--Dr Levin Bacon, [place]   BREAST EXCISIONAL BIOPSY  04/2003   Left; benign disease   BREAST LUMPECTOMY WITH NEEDLE LOCALIZATION AND AXILLARY LYMPH NODE DISSECTION Left 09/12/2013   Procedure: BREAST LUMPECTOMY WITH NEEDLE LOCALIZATION AND AXILLARY LYMPH NODE DISSECTION;  Surgeon: [name], MD;  Location: [facility];  Service: General;  Laterality: Left;  and axilla   BREAST SURGERY     COLONOSCOPY  10/2010   proctitis; melanosis coli   EYE SURGERY Left    "fix lazy eye"   PORTACATH PLACEMENT Right 04/21/2013   Procedure: INSERTION PORT-A-CATH;  Surgeon: [name], MD;  Location: [facility];  Service: General;  Laterality: Right;   RIGHT OOPHORECTOMY  03/13/2007   SUBOCCIPITAL CRANIECTOMY CERVICAL LAMINECTOMY N/A 05/10/2014   Procedure: SUBOCCIPITAL CRANIECTOMY CERVICAL LAMINECTOMY/DURAPLASTY;  Surgeon: [name], MD;  Location: [facility];  Service: Neurosurgery;  Laterality: N/A;  suboccipital craniectomy with cervical laminectomy and duraplasty   TUBAL LIGATION     Medications: Reviewed & Updated - see associated section                       Current Outpatient Prescriptions:    acetaminophen (TYLENOL) 500 MG tablet, Take 500 mg by mouth every 6 (six) hours as needed for moderate pain. , Disp: , Rfl:    ALPRAZolam (XANAX) 1 MG tablet, TAKE 1 TABLET BY MOUTH 3 TIMES A DAY AS NEEDED  FOR ANXIETY, Disp: 60 tablet, Rfl: 5   atenolol (TENORMIN) 50 MG tablet, Take 1 tablet (50 mg total) by mouth 2 (two) times daily., Disp: 60 tablet, Rfl: 5   BAYER CONTOUR NEXT TEST test strip, TEST ONCE DAILY AS DIRECTED, Disp: 50 each, Rfl: 2   blood glucose meter kit and supplies KIT, Dispense based on patient and insurance preference. Use as directed by physician. Diabetes type 2. E11.9, Disp: 1 each, Rfl: 0   Cholecalciferol (VITAMIN D PO), Take 1 tablet by mouth daily. Pt takes 1000 units daily., Disp: , Rfl:    citalopram (CELEXA) 40 MG tablet, 1/2 tablet every morning for 5 days the one tablet  every morning, Disp: 90 tablet, Rfl: 1   diphenhydramine-acetaminophen (TYLENOL PM) 25-500 MG TABS, Take 2 tablets by mouth at bedtime., Disp: , Rfl:    lubiprostone (AMITIZA) 24 MCG capsule, Take 1 capsule (24 mcg total) by mouth daily with breakfast., Disp: 30 capsule, Rfl: 1   metFORMIN (GLUCOPHAGE-XR) 500 MG 24 hr tablet, Take 1 tablet (500 mg total) by mouth daily with breakfast., Disp: 30 tablet, Rfl: 11   nabumetone (RELAFEN) 500 MG tablet, Take 500 mg by mouth 2 (two) times daily., Disp: , Rfl:    pantoprazole (PROTONIX) 40 MG tablet, Take 1 tablet (40 mg total) by mouth 2 (two) times daily., Disp: 60 tablet, Rfl: 2   pravastatin (PRAVACHOL) 40 MG tablet, TAKE 1 TABLET (40 MG TOTAL) BY MOUTH DAILY., Disp: 90 tablet, Rfl: 1   valACYclovir (VALTREX) 1000 MG tablet, TAKE 1 TABLET (1,000 MG TOTAL) BY MOUTH DAILY., Disp: 30 tablet, Rfl: 2   Social History: Reviewed -  reports that she quit smoking about 34 years ago. Her smoking use included Cigarettes. She has a 7.50 pack-year smoking history. She has never used smokeless tobacco.  Objective Findings:  Vitals: Blood pressure 128/78, pulse 75, weight 154 lb (69.9 kg), last menstrual period 12/04/2008.  Physical Examination: General appearance - alert, well appearing, and in no distress Mental status - alert, oriented to person,  place, and time Abdomen - soft, nontender, nondistended, no masses or organomegaly Pelvic -  VULVA: normal appearing vulva with no masses, tenderness or lesions,  VAGINA: normal appearing vagina with normal color and discharge, no lesions, normal appearing secretions. smooth, normal appearing cuff. On palpation, cuff feels smooth except on the left. There is a small band of firmness on the left end of the vaginal cuff that is acutely tender and causes reproducible severe pain. Not tender except for one pin point spot on the left vaginal apex.   Wet prep: no white cells, numerous epithelial's, No trich. Rare clue cells  KOH negative   Assessment & Plan:   A:  1. Normal vaginal secretions, no evidence of RV fistula on exam   P:  1. Schedule transvaginal US for further evaluation of pain in 1-2w    By signing my name below, I, Hansel Feinstein, attest that this documentation has been prepared under the direction and in the presence of Jonnie Kind, MD. Electronically Signed: Hansel Feinstein, ED Scribe. 11/19/16. 3:08 PM.  I personally performed the services described in this documentation, which was SCRIBED in my presence. The recorded information has been reviewed and considered accurate. It has been edited as necessary during review. Jonnie Kind, MD

## 2016-11-23 NOTE — Progress Notes (Signed)
cc'ed to pcp °

## 2016-12-02 ENCOUNTER — Encounter: Payer: Self-pay | Admitting: Obstetrics and Gynecology

## 2016-12-02 ENCOUNTER — Ambulatory Visit: Payer: Medicaid Other | Admitting: Obstetrics and Gynecology

## 2016-12-02 ENCOUNTER — Other Ambulatory Visit: Payer: Self-pay | Admitting: Obstetrics and Gynecology

## 2016-12-02 ENCOUNTER — Telehealth: Payer: Self-pay | Admitting: Genetics

## 2016-12-02 ENCOUNTER — Ambulatory Visit (INDEPENDENT_AMBULATORY_CARE_PROVIDER_SITE_OTHER): Payer: Medicaid Other

## 2016-12-02 VITALS — BP 122/78 | HR 60 | Wt 156.0 lb

## 2016-12-02 DIAGNOSIS — R159 Full incontinence of feces: Secondary | ICD-10-CM

## 2016-12-02 DIAGNOSIS — R102 Pelvic and perineal pain: Secondary | ICD-10-CM

## 2016-12-02 DIAGNOSIS — K629 Disease of anus and rectum, unspecified: Secondary | ICD-10-CM

## 2016-12-02 DIAGNOSIS — K6289 Other specified diseases of anus and rectum: Secondary | ICD-10-CM | POA: Diagnosis not present

## 2016-12-02 NOTE — Telephone Encounter (Signed)
Received a voice message from [name] [name] on 12/01/2016 stating that she had a question regarding her genetic testing that was previously performed 06/2016. I returned her call and left a message requesting a call back.

## 2016-12-02 NOTE — Progress Notes (Signed)
[facility] Visit  12/02/2016       Patient name: [name] [name] MRN [id number]  Date of birth: Jul 25, 1960  CC & HPI:  [name] [name] is a 56 y.o. female presenting today for follow up concerning intermittent vaginal pressure and [place].She states she intermittently experiences fecal matter coming out of the vagina. During last visit on 11/19/2016, there was concern for  intermitent RV fistula. No evidence was found but there was tenderness to left corner of vaginal apex.Today's [place] was to evaluate this. US showed normal cuff, pt only had pain when ovary was contacted. At this time pt has no other acute complaints.   ROS:  ROS  Otherwise negative for acute change except as noted in the HPI.  Pertinent History Reviewed:   Reviewed: Significant for breast cancer, abdominal hysterectomy, tubal ligation, right oophorectomy, breast lumpectomy  Medical         Past Medical History:  Diagnosis Date   Allergic rhinitis    Anemia, unspecified 05/30/2013   Anxiety    Breast cancer ([place]) 03/31/13   left   Cancer ([place])    Chest pain 2009   Consultation-[facility], negative chest CT; nl echo in 2005; h/o palpitations   Colitis 2010   not IBD   Degenerative joint disease    + degenerative joint disease of the lumbosacral spine   Depression    Diabetes ([place])    Diabetes ([place])    Dysrhythmia    palpations   GERD (gastroesophageal reflux disease)    "a little"   Headache    Heart murmur    "small"   Herpes simplex type II infection    Hypercholesteremia    "slightly high"   Hypertension    does not have high blood pressure   Meningitis 08/22/2014   Palpitations    Wears glasses                               Surgical Hx:    Past Surgical History:  Procedure Laterality Date   ABDOMINAL HYSTERECTOMY  03/13/2007   TAH ?BSO--Dr Levin Bacon, [place]   BREAST EXCISIONAL BIOPSY  04/2003   Left; benign disease   BREAST LUMPECTOMY WITH NEEDLE LOCALIZATION AND  AXILLARY LYMPH NODE DISSECTION Left 09/12/2013   Procedure: BREAST LUMPECTOMY WITH NEEDLE LOCALIZATION AND AXILLARY LYMPH NODE DISSECTION;  Surgeon: [name], MD;  Location: [facility];  Service: General;  Laterality: Left;  and axilla   BREAST SURGERY     COLONOSCOPY  10/2010   proctitis; melanosis coli   EYE SURGERY Left    "fix lazy eye"   PORTACATH PLACEMENT Right 04/21/2013   Procedure: INSERTION PORT-A-CATH;  Surgeon: [name], MD;  [facility];  Service: General;  Laterality: Right;   RIGHT OOPHORECTOMY  03/13/2007   SUBOCCIPITAL CRANIECTOMY CERVICAL LAMINECTOMY N/A 05/10/2014   Procedure: SUBOCCIPITAL CRANIECTOMY CERVICAL LAMINECTOMY/DURAPLASTY;  Surgeon: [name], MD;  [facility];  Service: Neurosurgery;  Laterality: N/A;  suboccipital craniectomy with cervical laminectomy and duraplasty   TUBAL LIGATION     Medications: Reviewed & Updated - see associated section                       Current Outpatient Prescriptions:    acetaminophen (TYLENOL) 500 MG tablet, Take 500 mg by mouth every 6 (six) hours as needed for moderate pain. , Disp: , Rfl:  ALPRAZolam (XANAX) 1 MG tablet, TAKE 1 TABLET BY MOUTH 3 TIMES A DAY AS NEEDED FOR ANXIETY, Disp: 60 tablet, Rfl: 5   atenolol (TENORMIN) 50 MG tablet, Take 1 tablet (50 mg total) by mouth 2 (two) times daily., Disp: 60 tablet, Rfl: 5   BAYER CONTOUR NEXT TEST test strip, TEST ONCE DAILY AS DIRECTED, Disp: 50 each, Rfl: 2   blood glucose meter kit and supplies KIT, Dispense based on patient and insurance preference. Use as directed by physician. Diabetes type 2. E11.9, Disp: 1 each, Rfl: 0   Cholecalciferol (VITAMIN D PO), Take 1 tablet by mouth daily. Pt takes 1000 units daily., Disp: , Rfl:    citalopram (CELEXA) 40 MG tablet, 1/2 tablet every morning for 5 days the one tablet every morning, Disp: 90 tablet, Rfl: 1   diphenhydramine-acetaminophen (TYLENOL PM) 25-500 MG TABS,  Take 2 tablets by mouth at bedtime., Disp: , Rfl:    lubiprostone (AMITIZA) 24 MCG capsule, Take 1 capsule (24 mcg total) by mouth daily with breakfast., Disp: 30 capsule, Rfl: 1   metFORMIN (GLUCOPHAGE-XR) 500 MG 24 hr tablet, Take 1 tablet (500 mg total) by mouth daily with breakfast., Disp: 30 tablet, Rfl: 11   nabumetone (RELAFEN) 500 MG tablet, Take 500 mg by mouth 2 (two) times daily., Disp: , Rfl:    pantoprazole (PROTONIX) 40 MG tablet, Take 1 tablet (40 mg total) by mouth 2 (two) times daily., Disp: 60 tablet, Rfl: 2   pravastatin (PRAVACHOL) 40 MG tablet, TAKE 1 TABLET (40 MG TOTAL) BY MOUTH DAILY., Disp: 90 tablet, Rfl: 1   tamoxifen (NOLVADEX) 10 MG tablet, Take 10 mg by mouth 2 (two) times daily., Disp: , Rfl:    valACYclovir (VALTREX) 1000 MG tablet, TAKE 1 TABLET (1,000 MG TOTAL) BY MOUTH DAILY., Disp: 30 tablet, Rfl: 2   Social History: Reviewed -  reports that she quit smoking about 34 years ago. Her smoking use included Cigarettes. She has a 7.50 pack-year smoking history. She has never used smokeless tobacco.  Objective Findings:  Vitals: Blood pressure 122/78, pulse 60, weight 156 lb (70.8 kg), last menstrual period 12/04/2008.  Physical Examination: General appearance - alert, well appearing, and in no distress Mental status - alert, oriented to person, place, and time Pelvic -  VULVA: normal appearing vulva with no masses, tenderness or lesions, VAGINA: good length, vaginal apex is mobile, clear peritoneal fluid seen anterior to vagina, not posterior, CERVIX: absent.  Left ovary seprate from cuff, tenderness on transvaginal US contact   Greater than 50% was spent in counseling and coordination of care with the patient. [place] results discussed as well as possible surgical interventions. Total time greater than: 25 minutes  Assessment & Plan:   A:  1. No identifiable fistula at this time 2. LLQ pain due to ovary  Contact 3. Right ovary absent , s/p  hysterectomy  P:  1. Follow up PRN for same day appointment if pt experiences suspected fecal matter from vagina  Patient is aware of the surgical plan offered by prior gen surgery eval, and since she rarely perceives this alleged fecal material per vagina, she is not interested in referral at this time back to surgery.  By signing my name below, I, Evelene Croon, attest that this documentation has been prepared under the direction and in the presence of Jonnie Kind, MD . Electronically Signed: Evelene Croon, Scribe. 12/02/2016. 3:31 PM. I personally performed the services described in this documentation, which was SCRIBED in  my presence. The recorded information has been reviewed and considered accurate. It has been edited as necessary during review. Jonnie Kind, MD

## 2016-12-02 NOTE — Progress Notes (Signed)
PELVIC US TA/TV: normal vaginal cuff,small amount of peroneal fluid outlining the anterior surface of the vaginal cuff with peristalsing bowel noted,normal left ovary, ovary appears mobile,left ovarian pain during ultrasound,right ovary was not visualized,Dr. Glo Herring review images during ultrasound

## 2016-12-03 ENCOUNTER — Ambulatory Visit (INDEPENDENT_AMBULATORY_CARE_PROVIDER_SITE_OTHER): Payer: Medicaid Other | Admitting: Nurse Practitioner

## 2016-12-03 ENCOUNTER — Encounter: Payer: Self-pay | Admitting: Nurse Practitioner

## 2016-12-03 ENCOUNTER — Telehealth: Payer: Self-pay | Admitting: Genetics

## 2016-12-03 VITALS — BP 124/86 | Ht 60.0 in | Wt 154.6 lb

## 2016-12-03 DIAGNOSIS — D171 Benign lipomatous neoplasm of skin and subcutaneous tissue of trunk: Secondary | ICD-10-CM

## 2016-12-03 DIAGNOSIS — E119 Type 2 diabetes mellitus without complications: Secondary | ICD-10-CM | POA: Diagnosis not present

## 2016-12-03 NOTE — Telephone Encounter (Signed)
Ms. [name] [name] called to ask if she has Lynch syndrome. After review of Ms. [name] [name]'s genetic testing, performed through [facility] on 06/22/2016, I discussed with Ms. 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 We discussed that Ms. [name] [name] does not have a diagnosis of Lynch syndrome due to this VUS. At this time, it is unknown if this variant is associated with increased cancer risk or if this is a normal finding, but most variants such as this get reclassified to being inconsequential. It should not be used to make medical management decisions.   I called [facility] to inquire about any updates to Ms. [name] [name]'s VUS classification. I was informed that it remains a VUS. With time, we suspect the lab will determine the significance of this variant, if any. If we do learn more about it, we will try to contact Ms. [name] [name] to discuss it further.

## 2016-12-04 ENCOUNTER — Encounter: Payer: Self-pay | Admitting: Nurse Practitioner

## 2016-12-04 NOTE — Progress Notes (Addendum)
Subjective:  Patient presents to discuss her diabetes. Currently seeing Dr. Loanne Drilling for endocrinology. According to patient her last hemoglobin A1c in his office was 5.6. Fasting sugars are running less than 100 and always below 200 after eating. Patient states she is experiencing symptoms of low blood sugar. Extreme fatigue at times. Staying hungry a lot more. Would like to stop her metformin to see if this would help. Has been losing weight without trying. Has started eating a healthier diet with less carbs. 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 Last seen 10/12/16. Is causing her some discomfort. Is ready for referral for evaluation.   Objective:   BP 124/86   Ht 5' (1.524 m)   Wt 154 lb 9.6 oz (70.1 kg)   LMP 12/04/2008   BMI 30.19 kg/m  NAD. Alert, oriented. Lungs clear. Heart regular rate rhythm. Weight has rebounded slightly over the past month. Large soft, fluctuant lipoma noted in the thoracic area. Minimally tender.   Assessment:   Problem List Items Addressed This Visit      Endocrine   Diabetes ([place]) - Primary     Other   Lipoma of back         Plan:  Hold on metformin for now. Patient to check her blood sugar once a day at different times of the day but fasting and after meals record results and send through her mild chart account. Observe the following: Blood sugar results, fatigue, increased hunger and her weight. Further instructions based on this information. Will refer to general surgeon for lipoma.  Return in about 3 months (around 03/05/2017) for recheck.

## 2016-12-07 ENCOUNTER — Ambulatory Visit: Payer: Medicaid Other | Attending: Orthopedic Surgery | Admitting: Physical Therapy

## 2016-12-07 DIAGNOSIS — M25611 Stiffness of right shoulder, not elsewhere classified: Secondary | ICD-10-CM | POA: Diagnosis present

## 2016-12-07 DIAGNOSIS — M25511 Pain in right shoulder: Secondary | ICD-10-CM | POA: Diagnosis present

## 2016-12-07 DIAGNOSIS — M6281 Muscle weakness (generalized): Secondary | ICD-10-CM | POA: Insufficient documentation

## 2016-12-07 DIAGNOSIS — G8929 Other chronic pain: Secondary | ICD-10-CM | POA: Diagnosis present

## 2016-12-07 NOTE — Therapy (Addendum)
[facility] [address] [place], [place], [address] Phone: [phone]   Fax:  [phone]  Physical Therapy Evaluation  Patient Details  Name: [name] [name] MRN: [id number] Date of Birth: 1961-04-01 Referring Provider: Maryan Rued.  Encounter Date: 12/07/2016      PT End of Session - 12/07/16 1200    Visit Number 1   Number of Visits 1   Date for PT Re-Evaluation 12/07/16   PT Start Time 0952   PT Stop Time 1022   PT Time Calculation (min) 30 min   Activity Tolerance Patient tolerated treatment well   Behavior During Therapy [facility] for tasks assessed/performed      Past Medical History:  Diagnosis Date  . Allergic rhinitis   . Anemia, unspecified 05/30/2013  . Anxiety   . Breast cancer (HCC) 03/31/13   left  . Cancer (HCC)   . Chest pain 2009   Consultation-[facility], negative chest CT; nl echo in 2005; h/o palpitations  . Colitis 2010   not IBD  . Degenerative joint disease    + degenerative joint disease of the lumbosacral spine  . Depression   . Diabetes (HCC)   . Diabetes (HCC)   . Dysrhythmia    palpations  . GERD (gastroesophageal reflux disease)    "a little"  . Headache   . Heart murmur    "small"  . Herpes simplex type II infection   . Hypercholesteremia    "slightly high"  . Hypertension    does not have high blood pressure  . 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 Palpitations   . Wears glasses     Past Surgical History:  Procedure Laterality Date  . ABDOMINAL HYSTERECTOMY  03/13/2007   TAH ?BSO--Dr Cheron Every, [place]  . BREAST EXCISIONAL BIOPSY  04/2003   Left; benign disease  . BREAST LUMPECTOMY WITH NEEDLE LOCALIZATION AND AXILLARY LYMPH NODE DISSECTION Left 09/12/2013   Procedure: BREAST LUMPECTOMY WITH NEEDLE LOCALIZATION AND AXILLARY LYMPH NODE DISSECTION;  Surgeon: Kandis Cocking, MD;  Location: [facility];  Service: General;  Laterality: Left;  and axilla  . BREAST SURGERY    .  COLONOSCOPY  10/2010   proctitis; melanosis coli  . EYE SURGERY Left    "fix lazy eye"  . PORTACATH PLACEMENT Right 04/21/2013   Procedure: INSERTION PORT-A-CATH;  Surgeon: Kandis Cocking, MD;  Location: [facility];  Service: General;  Laterality: Right;  . RIGHT OOPHORECTOMY  03/13/2007  . SUBOCCIPITAL CRANIECTOMY CERVICAL LAMINECTOMY N/A 05/10/2014   Procedure: SUBOCCIPITAL CRANIECTOMY CERVICAL LAMINECTOMY/DURAPLASTY;  Surgeon: Hewitt Shorts, MD;  Location: [facility];  Service: Neurosurgery;  Laterality: N/A;  suboccipital craniectomy with cervical laminectomy and duraplasty  . TUBAL LIGATION      There were no vitals filed for this visit.       Subjective Assessment - 12/07/16 1227    Subjective The patient reports right shoulder pain over the last 6 months.  She recalls no injury.  She had an injection that helped decrease her pain some and improved her range of motion.  her resting pain-level is a 2/10 today.  Certain movements increase her pain.  Rest decreases her pain.   Pertinent History Cervical surgery.   Diagnostic tests MRI ("I was told I have a tear").   Patient Stated Goals Use right UE without pain.   Currently in Pain? Yes   Pain Score 2    Pain Location Shoulder   Pain Orientation Right   Pain Descriptors /  Indicators Aching   Pain Type Chronic pain   Pain Onset More than a month ago   Pain Frequency Constant   Aggravating Factors  See above.   Pain Relieving Factors See above.            [facility] PT Assessment - 12/07/16 0001      Assessment   Medical Diagnosis Right shoulder pain.   Referring Provider Maryan Rued.   Onset Date/Surgical Date --  6 months.     Precautions   Precautions None     Restrictions   Weight Bearing Restrictions No     Balance Screen   Has the patient fallen in the past 6 months No   Has the patient had a decrease in activity level because of a fear of falling?  No   Is the patient reluctant to leave their home because  of a fear of falling?  No     Home Environment   Living Environment Private residence     Prior Function   Level of Independence Independent     Posture/Postural Control   Posture/Postural Control Postural limitations   Postural Limitations Rounded Shoulders;Forward head     ROM / Strength   AROM / PROM / Strength AROM;Strength     AROM   Overall AROM Comments Right shoulder antigravity flexion= 125 degrees and passive= 140 degrees; ER= 77 degrees and passive= 85 degrees and behind back to right SIJ.     Strength   Overall Strength Comments Right shoulder ER= 4-/5 and IR= 4/5.     Palpation   Palpation comment Tender to palpation over right anterior shoulder with pain into right Bicep region as well.     Special Tests    Special Tests Rotator Cuff Impingement  Normal right UE DTR's.   Rotator Cuff Impingment tests --  (+) RT shoulder Impingement test.     Ambulation/Gait   Gait Comments WNL.            Objective measurements completed on examination: See above findings.                  PT Education - 12/07/16 1202    Education provided Yes   Person(s) Educated Patient   Methods Explanation;Demonstration;Verbal cues   Comprehension Verbalized understanding;Returned demonstration             PT Long Term Goals - 12/07/16 1237      PT LONG TERM GOAL #1   Title Evaluation only.                Plan - 12/07/16 1234    Clinical Impression Statement The patient presents to OPPT wiht c/o right shoulder pain that has improved since receiving an injection.  She has signicant limitation of right shoulder motion and strength.  She has a positive right shoulder Impingemtn test.  Deficits limite her ability to perform ADL's.  Patient in clinic for evaluation only today.   History and Personal Factors relevant to plan of care: Neck surgery.  DM.   Clinical Presentation Stable   Clinical Decision Making Low   Rehab Potential Good   PT  Frequency --  Eval only.   Consulted and Agree with Plan of Care Patient      Patient will benefit from skilled therapeutic intervention in order to improve the following deficits and impairments:  Pain, Decreased activity tolerance, Decreased strength, Decreased range of motion  Visit Diagnosis: Chronic right shoulder pain - Plan: PT plan  of care cert/re-cert  Stiffness of right shoulder, not elsewhere classified - Plan: PT plan of care cert/re-cert  Muscle weakness (generalized) - Plan: PT plan of care cert/re-cert     Problem List Patient Active Problem List   Diagnosis Date Noted  . Genetic testing 07/01/2016  . Lipoma of back 06/03/2016  . Reactive hypoglycemia 06/03/2016  . Family history of breast cancer in female 05/22/2016  . Diabetes (HCC)   . Constipation 12/10/2015  . Depression 08/23/2014  . Anxiety 08/23/2014  . Heart murmur 08/23/2014  . Hypercholesteremia 08/23/2014  . History of Chiari malformation 08/23/2014  . 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 Fever   . Vaginal dryness 07/16/2014  . 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 Joint pain 10/02/2013  . Neuropathic pain 10/02/2013  . Onychomycosis of toenail 10/02/2013  . Frequent headaches 06/19/2013  . Anxiety as acute reaction to exceptional stress 05/22/2013  . Malignant neoplasm of upper-outer quadrant of left breast in female, estrogen receptor positive (HCC) 04/06/2013  . 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 Obesity (BMI 30-39.9) 12/14/2012  . GERD (gastroesophageal reflux disease) 12/14/2012  . Chronic depression 12/14/2012  . Heart murmur, systolic 09/02/2012  . Essential hypertension 09/02/2012  . Chest pain   . Palpitations   . DEGENERATIVE JOINT DISEASE, RIGHT KNEE 10/03/2009  . DISC DEGENERATION 10/24/2007    Sandeep Radell, [place] MPT 12/07/2016, 12:39 PM  [facility] [address] [place], [place], [address] Phone: [phone]   Fax:   [phone]  Name: Tsurue Wargel MRN: [id number] Date of Birth: 01/23/1961  PHYSICAL THERAPY DISCHARGE SUMMARY  Visits from Start of Care: 1.  Current functional level related to goals / functional outcomes: Eval only.   Remaining deficits: Eval only   Education / Equipment:  Plan: Patient agrees to discharge.  Patient goals were met. Patient is being discharged due to meeting the stated rehab goals.  ?????         [place] Mitali Shenefield MPT

## 2016-12-07 NOTE — Patient Instructions (Signed)
Showed patient wall climb to increase right shoulder flexion and gentle doorway stretch for ER as well as self-care of short duration ice massage over affected right shoulder region and info on how to obtain a TENS unit for pain-control.

## 2016-12-11 ENCOUNTER — Other Ambulatory Visit: Payer: Self-pay | Admitting: Family Medicine

## 2016-12-14 ENCOUNTER — Other Ambulatory Visit: Payer: Self-pay | Admitting: Nurse Practitioner

## 2016-12-14 ENCOUNTER — Telehealth: Payer: Self-pay | Admitting: Nurse Practitioner

## 2016-12-14 DIAGNOSIS — D171 Benign lipomatous neoplasm of skin and subcutaneous tissue of trunk: Secondary | ICD-10-CM

## 2016-12-14 NOTE — Telephone Encounter (Signed)
Pt called to check on referral to [facility] for a lipoma on her back  Nothing in last OV note, please advise & initiate referral in system so that I may process

## 2016-12-14 NOTE — Telephone Encounter (Signed)
Referral in system. See notes 4/9 and 5/31. Thanks.

## 2016-12-15 ENCOUNTER — Encounter: Payer: Self-pay | Admitting: Family Medicine

## 2016-12-23 ENCOUNTER — Other Ambulatory Visit: Payer: Self-pay | Admitting: Oncology

## 2016-12-23 ENCOUNTER — Ambulatory Visit (HOSPITAL_BASED_OUTPATIENT_CLINIC_OR_DEPARTMENT_OTHER): Payer: Medicaid Other | Admitting: Oncology

## 2016-12-23 VITALS — BP 108/67 | HR 64 | Temp 98.1°F | Resp 18 | Ht 60.0 in | Wt 157.0 lb

## 2016-12-23 DIAGNOSIS — Z170 Estrogen receptor positive status [ER+]: Secondary | ICD-10-CM

## 2016-12-23 DIAGNOSIS — C50412 Malignant neoplasm of upper-outer quadrant of left female breast: Secondary | ICD-10-CM | POA: Diagnosis not present

## 2016-12-23 DIAGNOSIS — Z79810 Long term (current) use of selective estrogen receptor modulators (SERMs): Secondary | ICD-10-CM | POA: Diagnosis not present

## 2016-12-23 NOTE — Progress Notes (Signed)
ID: [name] [name] OB: 09/25/1960  MR#: [id number]  SHF#:[id number]  PCP: Mikey Kirschner, MD ; Pearson Forster, NP GYN:   Josefa Half, MD  SU: Alphonsa Overall, MD [place]:  Thea Silversmith, MD OTHER MD: Freida Busman, MD;  Neysa Bonito, MD, Sharman Crate MD  CHIEF COMPLAINT:  Left Breast Cancer  CURRENT TREATMENT: Tamoxifen  BREAST CANCER HISTORY: From the original intake note 04/12/2013:  [name] [name] palpated a mass in her left breast in late August 2014. She was already scheduled for screening mammography at [facility] and this was performed 03/13/2013. Indeed a possible mass was noted in the left breast and on 03/29/2013 the patient underwent diagnostic left mammography and left ultrasonography. This showed a 2.5 cm irregular mass in the upper outer quadrant of the left breast. This was palpable.by ultrasound there was a 1.6 cm irregular hypoechoic mass in the area in question, and in addition to enlarged left axillary lymph nodes were noted, the largest measuring 2.5 cm.  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  Bilateral breast MRI 04/11/2013 shows 3 abnormal enhancing masses in the upper outer quadrant of the left breast measuring altogether 6.4 cm. The largest separate mass measured 2.7 cm. There were abnormal or large lymph nodes in the left axilla. Ultrasound guided biopsy of the 2 smaller masses has been scheduled.  The patient's subsequent history is as detailed below   INTERVAL HISTORY: [name] [name] returns today for follow-up and treatment of her estrogen receptor positive breast cancer. She continues on tamoxifen. Generally she tolerates that  well. Hot flashes are acceptable. She does not have significant vaginal wetness. In fact she recently had bilateral pelvic ultrasonography including transvaginal ultrasonography which was unremarkable.  It did document that she still has a left ovary.  EVIEW OF SYSTEMS: [name] feels her left breast is increasing in size. It is more uncomfortable. It is harder. She also has discovered a mass in her right breast. This is of great concern to her. She has significant right shoulder problems and is seeing orthopedics regarding that. She says she has a tear in her rotator cuff. They "mass", a sebaceous cyst, in her back, she thinks is getting bigger also. She tells me she has headaches due to stress. The situation at home continues to be difficult. Aside from all this a detailed review of systems today was stable  PAST MEDICAL HISTORY: Past Medical History:  Diagnosis Date  . Allergic rhinitis   . Anemia, unspecified 05/30/2013  . Anxiety   . Breast cancer ([place]) 03/31/13   left  . Cancer ([place])   . Chest pain 2009   Consultation-Rothbart, negative chest CT; nl echo in 2005; h/o palpitations  . Colitis 2010   not IBD  . Degenerative joint disease    + degenerative joint disease of the lumbosacral spine  . Depression   . Diabetes ([place])   . Diabetes ([place])   . Dysrhythmia    palpations  . GERD (gastroesophageal reflux disease)    "a little"  . Headache   . Heart murmur    "small"  . Herpes simplex type II infection   . Hypercholesteremia    "  slightly high"  . Hypertension    does not have high blood pressure  . 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 Palpitations   . Wears glasses     PAST SURGICAL HISTORY: Past Surgical History:  Procedure Laterality Date  . ABDOMINAL HYSTERECTOMY  03/13/2007   TAH ?BSO--Dr Levin Bacon, [place]  . BREAST EXCISIONAL BIOPSY  04/2003   Left; benign disease  . BREAST LUMPECTOMY WITH NEEDLE LOCALIZATION AND AXILLARY LYMPH NODE DISSECTION Left 09/12/2013   Procedure:  BREAST LUMPECTOMY WITH NEEDLE LOCALIZATION AND AXILLARY LYMPH NODE DISSECTION;  Surgeon: [name], MD;  Location: [facility];  Service: General;  Laterality: Left;  and axilla  . BREAST SURGERY    . COLONOSCOPY  10/2010   proctitis; melanosis coli  . EYE SURGERY Left    "fix lazy eye"  . PORTACATH PLACEMENT Right 04/21/2013   Procedure: INSERTION PORT-A-CATH;  Surgeon: [name], MD;  [facility];  Service: General;  Laterality: Right;  . RIGHT OOPHORECTOMY  03/13/2007  . SUBOCCIPITAL CRANIECTOMY CERVICAL LAMINECTOMY N/A 05/10/2014   Procedure: SUBOCCIPITAL CRANIECTOMY CERVICAL LAMINECTOMY/DURAPLASTY;  Surgeon: [name], MD;  Location: [facility];  Service: Neurosurgery;  Laterality: N/A;  suboccipital craniectomy with cervical laminectomy and duraplasty  . TUBAL LIGATION      FAMILY HISTORY Family History  Problem Relation Age of Onset  . Aneurysm Mother        Cerebral  . Hypertension Mother   . Hyperlipidemia Mother   . Stroke Mother   . Coronary artery disease Father   . Hypertension Father   . Hyperlipidemia Father   . Heart attack Father        d. 63  . Lung cancer Father        dx early 34s; former smoker  . Lung cancer Brother   . Lung cancer Brother        d. 13y; smoker  . Lung cancer Sister        paternal half-sister; not a smoker; dx <60; d. 19y  . Leukemia Maternal Aunt        "blood cancer"; d. unspecified age  . Cervical cancer Paternal Aunt        d. late 76s  . Breast cancer Sister 11  . Other Sister        hx of hysterectomy for unspecified reason  . Multiple myeloma Brother        d. 42s  . Lung cancer Brother        dx late 50s; smoker  . Prostate cancer Brother        dx late 57s  . Prostate cancer Brother        dx late 49s  . Lung cancer Brother   . Lung cancer Maternal Aunt        d. unspecified age  . Breast cancer Maternal Aunt        dx unspecified age  . Prostate cancer Cousin        maternal 1st  cousin; dx older age  . Breast cancer Other        niece dx early 73s or before  . Leukemia Other        nephew d. 2y; "blood cancer"  . Colon cancer Neg Hx   . Diabetes Neg Hx   The patient's father died at the age of 26 from a myocardial infarction. The patient's mother died at the age of 42 from a stroke. The patient had 6  brothers, 2 sisters. One brother has a history of lung cancer in the setting of tobacco abuse. Another brother had multiple myeloma. One sister was diagnosed with breast cancer at the age of 49   GYNECOLOGIC HISTORY: (Updated 09/25/2013) Menarche age 31, first live birth age 67, the patient is [place] P4. She status post abdominal hysterectomy with bilateral salpingo-oophorectomy. She never took hormone replacement.   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 Her husband [name] works in Charity fundraiser. Daughter [name] formerly lived at home with the patient with her 3 children, all under the age of 38; they have subsequently moved out. She is not employed. Son Jamayia Croker lives in [place] and is not employed. Daughter Maggie Schwalbe Dillard lives in [place] and is a Barrister's clerk. Daughter Ernst Spell is a Education officer, museum in [place]. The patient has 10 grandchildren. She attends the [employer]     ADVANCED DIRECTIVES: Not in place   HEALTH MAINTENANCE:  (Updated 09/25/2013)  Social History  Substance Use Topics  . Smoking status: Former Smoker    Packs/day: 0.50    Years: 15.00    Types: Cigarettes    Quit date: 07/06/1982  . Smokeless tobacco: Never Used  . Alcohol use No     Colonoscopy:2012  PAP:2014   Bone density: Never  Lipid panel: Not on file    Allergies  Allergen Reactions  . Doxycycline Itching and Swelling    Current Outpatient Prescriptions  Medication Sig Dispense Refill  . acetaminophen (TYLENOL) 500 MG tablet Take 500 mg by mouth every 6  (six) hours as needed for moderate pain.     Marland Kitchen ALPRAZolam (XANAX) 1 MG tablet TAKE 1 TABLET BY MOUTH 3 TIMES A DAY AS NEEDED FOR ANXIETY 60 tablet 5  . atenolol (TENORMIN) 50 MG tablet Take 1 tablet (50 mg total) by mouth 2 (two) times daily. 60 tablet 5  . BAYER CONTOUR NEXT TEST test strip TEST ONCE DAILY AS DIRECTED 50 each 2  . blood glucose meter kit and supplies KIT Dispense based on patient and insurance preference. Use as directed by physician. Diabetes type 2. E11.9 1 each 0  . Cholecalciferol (VITAMIN D PO) Take 1 tablet by mouth daily. Pt takes 1000 units daily.    . citalopram (CELEXA) 40 MG tablet 1/2 tablet every morning for 5 days the one tablet every morning 90 tablet 1  . diphenhydramine-acetaminophen (TYLENOL PM) 25-500 MG TABS Take 2 tablets by mouth at bedtime.    Marland Kitchen lubiprostone (AMITIZA) 24 MCG capsule Take 1 capsule (24 mcg total) by mouth daily with breakfast. 30 capsule 1  . metFORMIN (GLUCOPHAGE-XR) 500 MG 24 hr tablet Take 1 tablet (500 mg total) by mouth daily with breakfast. 30 tablet 11  . nabumetone (RELAFEN) 500 MG tablet Take 500 mg by mouth 2 (two) times daily.    . pantoprazole (PROTONIX) 40 MG tablet Take 1 tablet (40 mg total) by mouth 2 (two) times daily. 60 tablet 2  . pravastatin (PRAVACHOL) 40 MG tablet TAKE 1 TABLET (40 MG TOTAL) BY MOUTH DAILY. 90 tablet 1  . tamoxifen (NOLVADEX) 10 MG tablet Take 10 mg by mouth 2 (two) times daily.    . valACYclovir (VALTREX) 1000 MG tablet TAKE 1 TABLET (1,000 MG TOTAL) BY MOUTH DAILY. 30 tablet 2   No current facility-administered medications for this visit.     OBJECTIVE: Middle-aged [place] American woman  Who appears stated age  84:   12/23/16 1149  BP: 108/67  Pulse: 64  Resp: 18  Temp: 98.1 F (36.7 C)  Body mass index is 30.66 kg/m.  ECOG: 1 Filed Weights   12/23/16 1149  Weight: 157 lb (71.2 kg)   Sclerae unicteric, pupils round and equal Oropharynx clear and moist No cervical or  supraclavicular adenopathy Lungs no rales or rhonchi Heart regular rate and rhythm Abd soft, nontender, positive bowel sounds MSK diffuse spinal tenderness to mild palpation; limited range of motion right upper extremity  Neuro: nonfocal, well oriented, anxious and depressed affect Breasts: The right breast is unremarkable. Palpation feels what I think is trabecular tissue deep below the areola. I do not palpate any right axillary adenopathy. The left breast is status post lumpectomy and radiation. It is still hyperpigmented. The superior outer aspect is firm and slightly prominent. I do not palpate any left axillary adenopathy. Skin: The area of patient's concern in the back appears to be a fairly flat sebaceous cyst  LAB RESULTS:   Lab Results  Component Value Date   WBC 7.1 07/23/2016   NEUTROABS 4.3 07/23/2016   HGB 11.8 07/23/2016   HCT 35.1 07/23/2016   MCV 85.8 07/23/2016   PLT 182 07/23/2016      Chemistry      Component Value Date/Time   NA 143 07/23/2016 1018   K 3.8 07/23/2016 1018   CL 103 04/15/2016 1403   CO2 26 07/23/2016 1018   BUN 10.6 07/23/2016 1018   CREATININE 0.9 07/23/2016 1018      Component Value Date/Time   CALCIUM 9.4 07/23/2016 1018   ALKPHOS 52 07/23/2016 1018   AST 14 07/23/2016 1018   ALT 12 07/23/2016 1018   BILITOT 0.39 07/23/2016 1018         STUDIES: US Transvaginal Non-ob  Result Date: 12/03/2016 GYNECOLOGIC SONOGRAM Dayzha Matzek is a 56 y.o. [id number] s/p hysterectomy and ? Right oophorectomy,she is here for a pelvic sonogram for vaginal pressure. Uterus                      Surgically removed,normal vaginal cuff Endometrium          N/A Right ovary             Right ovary was not visualized Left ovary                3 x 2.6 x 1.9 cm, wnl Technician Comments: PELVIC US TA/TV: normal vaginal cuff,small amount of peroneal fluid outlining the anterior surface of the vaginal cuff with peristalsing bowel noted,normal left ovary, ovary  appears mobile,left ovarian pain during ultrasound,right ovary was not visualized,Dr. Glo Herring review images during ultrasound [employer] 12/02/2016 3:12 PM Clinical Impression and recommendations: I have reviewed the sonogram results above. Combined with the patient's current clinical course, below are my impressions and any appropriate recommendations for management based on the sonographic findings: 1. Resume normal appearing vaginal cuff mobile with a small amount of peritoneal fluid in front of it. The ultrasound process was reviewed in real time, during the ultrasound, and the only source of pain was on the vaginal cuff was elevated sufficiently that the probe was in contact with the left ovary 2. There was no identifiable area of suspicious attachment of the bowel to the cuff apex FERGUSON,JOHN V   US Pelvis Complete  Result Date: 12/03/2016 GYNECOLOGIC SONOGRAM Margery Biasi is a 56 y.o. [id number] s/p hysterectomy and ?  Right oophorectomy,she is here for a pelvic sonogram for vaginal pressure. Uterus                      Surgically removed,normal vaginal cuff Endometrium          N/A Right ovary             Right ovary was not visualized Left ovary                3 x 2.6 x 1.9 cm, wnl Technician Comments: PELVIC US TA/TV: normal vaginal cuff,small amount of peroneal fluid outlining the anterior surface of the vaginal cuff with peristalsing bowel noted,normal left ovary, ovary appears mobile,left ovarian pain during ultrasound,right ovary was not visualized,Dr. Glo Herring review images during ultrasound [employer] 12/02/2016 3:12 PM Clinical Impression and recommendations: I have reviewed the sonogram results above. Combined with the patient's current clinical course, below are my impressions and any appropriate recommendations for management based on the sonographic findings: 1. Resume normal appearing vaginal cuff mobile with a small amount of peritoneal fluid in front of it. The ultrasound process was  reviewed in real time, during the ultrasound, and the only source of pain was on the vaginal cuff was elevated sufficiently that the probe was in contact with the left ovary 2. There was no identifiable area of suspicious attachment of the bowel to the cuff apex FERGUSON,JOHN V     ASSESSMENT/PLAN: 56 y.o. [place], [place] woman   (1)  status post left breast upper outer quadrant biopsy 03/29/2013 for a clinical T2-T3 pN1, stage IIB-IIIA invasive ductal carcinoma, grade 2-3, estrogen receptor 39% positive (with moderate staining intensity), progesterone receptor negative, with an MIB-1 of 83%, and HER-2 amplified by CISH, with a ratio of 7.5 and 15 HER-2 copies per cell  (2) neoadjuvant chemotherapy, with trastuzumab/ pertuzumab/ carboplatin/ docetaxel repeated every 3 weeks x6, completed 08/21/2013. Pertuzumab  held after 4 cycles secondary to diarrhea. The patient received Neulasta on day 2 at [facility] per her request.   (3) Trastuzumab continued for total of one year (last dose 04/13/2014)). Most recent echo 06/04/2014 showed a well preserved ejection fraction.  (4)  status post left lumpectomy and axillary node dissection 09/12/2013, with a complete pathologic response (a total of 16 lymph nodes were examined,1 sentinel and 15 non-sentinel),  YpT0, ypN0.  (5) adjuvant radiation at [facility] completed 12/08/2013  (6) started anastrozole 01/16/2014  (a) bone density 04/09/2016 shows osteopenia with a T score of -1.7   (b) switched to tamoxifen as of OCT 2017 because of vaginal dryness issues   (i) status post remote total abdominal hysterectomy with right salpingo-oophorectomy  (c) pelvic and transvaginal ultrasound found a normal left ovary, vaginal cuff appeared normal, with no area of suspicious attachment of the bowel to the cuff apex  (7)  genetics testing 06/22/2016 through the 21-gene Custom Cancer Panel through [facility], MD) found no deleterious mutations  in ATM, BARD1, BRCA1, BRCA2, BRIP1, CDH1, CHEK2, EPCAM, FANCC, HOXB13, MLH1, MSH2, MSH6, NBN, PALB2, PMS2, PTEN, RAD51C, RAD51D, TP53, and XRCC2.    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    PLAN: [place] is now a little over 3 years out from definitive surgery for breast cancer with no evidence of disease recurrence. This is very favorable.  She continues on tamoxifen with generally good tolerance. The plan is to continue that for a total  of 5 years.  I discussed her concern regarding her right left and right breast. The mass that she is feeling in her right breast feels like trabecular tissue to me. It does not feel of normal. Nevertheless his is a major source of concern for her.  She feels the left breast is increased in size. She does have an area of firmness superior and medial to her incision. This is most likely scar tissue. Otherwise I think it warrants further evaluation.  Accordingly I am scheduling her for bilateral mammography within the next week. I expect it will show no significant change.  If so she will see me again in November. She will have lab work just before that visit.  She knows to call for any other issues that may develop before then.   Chauncey Cruel, MD 12/23/2016 12:09 PM

## 2016-12-28 ENCOUNTER — Ambulatory Visit
Admission: RE | Admit: 2016-12-28 | Discharge: 2016-12-28 | Disposition: A | Discharge status: Home / Self Care | Payer: Medicaid Other | Source: Ambulatory Visit | Attending: Oncology | Admitting: Oncology

## 2016-12-28 ENCOUNTER — Other Ambulatory Visit: Payer: Self-pay | Admitting: Oncology

## 2016-12-28 DIAGNOSIS — Z170 Estrogen receptor positive status [ER+]: Secondary | ICD-10-CM

## 2016-12-28 DIAGNOSIS — C50412 Malignant neoplasm of upper-outer quadrant of left female breast: Secondary | ICD-10-CM

## 2016-12-28 HISTORY — DX: Personal history of irradiation: Z92.3

## 2016-12-28 HISTORY — DX: Personal history of antineoplastic chemotherapy: Z92.21

## 2016-12-29 ENCOUNTER — Encounter: Payer: Self-pay | Admitting: Oncology

## 2017-01-07 ENCOUNTER — Encounter: Payer: Self-pay | Admitting: Gastroenterology

## 2017-01-07 ENCOUNTER — Ambulatory Visit: Payer: Medicaid Other | Admitting: Gastroenterology

## 2017-01-07 ENCOUNTER — Telehealth: Payer: Self-pay | Admitting: Gastroenterology

## 2017-01-07 NOTE — Telephone Encounter (Signed)
PATIENT WAS A NO SHOW AND LETTER SENT  °

## 2017-01-07 NOTE — Progress Notes (Deleted)
Subjective:    Patient ID: [name] [name], female    DOB: Jan 30, 1961, 56 y.o.   MRN: [id number]  Mikey Kirschner, MD   HPI   PT DENIES FEVER, CHILLS, HEMATOCHEZIA, HEMATEMESIS, nausea, vomiting, melena, diarrhea, CHEST PAIN, SHORTNESS OF BREATH,  CHANGE IN BOWEL IN HABITS, constipation, abdominal pain, problems swallowing, problems with sedation, heartburn or indigestion.  Past Medical History:  Diagnosis Date  . Allergic rhinitis   . Anemia, unspecified 05/30/2013  . Anxiety   . Breast cancer ([place]) 03/31/13   left  . Cancer ([place])   . Chest pain 2009   Consultation-[facility], negative chest CT; nl echo in 2005; h/o palpitations  . Colitis 2010   not IBD  . Degenerative joint disease    + degenerative joint disease of the lumbosacral spine  . Depression   . Diabetes ([place])   . Diabetes ([place])   . Dysrhythmia    palpations  . GERD (gastroesophageal reflux disease)    "a little"  . Headache   . Heart murmur    "small"  . Herpes simplex type II infection   . Hypercholesteremia    "slightly high"  . Hypertension    does not have high blood pressure  . 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 Palpitations   . Personal history of chemotherapy   . Personal history of radiation therapy   . Wears glasses     Past Surgical History:  Procedure Laterality Date  . ABDOMINAL HYSTERECTOMY  03/13/2007   TAH ?BSO--Dr Levin Bacon, [place]  . BREAST EXCISIONAL BIOPSY  04/2003   Left; benign disease  . BREAST LUMPECTOMY Left 09/2013  . BREAST LUMPECTOMY WITH NEEDLE LOCALIZATION AND AXILLARY LYMPH NODE DISSECTION Left 09/12/2013   Procedure: BREAST LUMPECTOMY WITH NEEDLE LOCALIZATION AND AXILLARY LYMPH NODE DISSECTION;  Surgeon: [name], MD;  Location: [place];  Service: General;  Laterality: Left;  and axilla  . BREAST SURGERY    . COLONOSCOPY  10/2010   proctitis; melanosis coli  . EYE SURGERY Left    "fix lazy eye"  . PORTACATH PLACEMENT Right 04/21/2013   Procedure:  INSERTION PORT-A-CATH;  Surgeon: [name], MD;  [facility];  Service: General;  Laterality: Right;  . RIGHT OOPHORECTOMY  03/13/2007  . SUBOCCIPITAL CRANIECTOMY CERVICAL LAMINECTOMY N/A 05/10/2014   Procedure: SUBOCCIPITAL CRANIECTOMY CERVICAL LAMINECTOMY/DURAPLASTY;  Surgeon: [name], MD;  Location: [place] [facility];  Service: Neurosurgery;  Laterality: N/A;  suboccipital craniectomy with cervical laminectomy and duraplasty  . TUBAL LIGATION      Allergies  Allergen Reactions  . Doxycycline Itching and Swelling    Review of Systems PER HPI OTHERWISE ALL SYSTEMS ARE NEGATIVE.     Objective:   Physical Exam  Constitutional: She is oriented to person, place, and time. She appears well-developed and well-nourished. No distress.  HENT:  Head: Normocephalic and atraumatic.  Mouth/Throat: Oropharynx is clear and moist. No oropharyngeal exudate.  Eyes: Pupils are equal, round, and reactive to light. No scleral icterus.  Neck: Normal range of motion. Neck supple.  Cardiovascular: Normal rate, regular rhythm and normal heart sounds.   Pulmonary/Chest: Effort normal and breath sounds normal. No respiratory distress.  Abdominal: Soft. Bowel sounds are normal. She exhibits no distension. There is no tenderness.  Musculoskeletal: She exhibits no edema.  Lymphadenopathy:    She has no cervical adenopathy.  Neurological: She is alert and oriented to person, place, and time.  Psychiatric: She has a normal mood and affect.  Vitals reviewed.         Assessment & Plan:

## 2017-01-08 NOTE — Telephone Encounter (Signed)
REVIEWED-NO ADDITIONAL RECOMMENDATIONS. 

## 2017-01-29 ENCOUNTER — Telehealth: Payer: Self-pay | Admitting: Family Medicine

## 2017-01-29 MED ORDER — ATENOLOL 50 MG PO TABS: 50.0000 mg | ORAL_TABLET | Freq: Two times a day (BID) | ORAL | 2 refills | Status: DC

## 2017-01-29 NOTE — Telephone Encounter (Signed)
Patient said she is having trouble getting her Atenolol refilled.  She said the pharmacy told her we denied this Rx, but she thought she had additional refills on the last bottle she had.  She is requesting a nurse call back.  [facility] [place] [facility]

## 2017-01-29 NOTE — Telephone Encounter (Signed)
Spoke with patient and informed her that refills were sent in on her atenolol. Patient verbalized understanding.

## 2017-02-05 ENCOUNTER — Other Ambulatory Visit: Payer: Self-pay | Admitting: Surgery

## 2017-03-04 ENCOUNTER — Ambulatory Visit: Payer: Medicaid Other | Admitting: Nurse Practitioner

## 2017-03-10 ENCOUNTER — Ambulatory Visit: Payer: Medicaid Other | Admitting: Gastroenterology

## 2017-03-22 ENCOUNTER — Encounter: Payer: Self-pay | Admitting: Nurse Practitioner

## 2017-03-22 ENCOUNTER — Encounter: Payer: Self-pay | Admitting: Family Medicine

## 2017-03-22 ENCOUNTER — Ambulatory Visit (INDEPENDENT_AMBULATORY_CARE_PROVIDER_SITE_OTHER): Payer: Medicare Other | Admitting: Nurse Practitioner

## 2017-03-22 VITALS — BP 112/74 | HR 68 | Ht 60.0 in | Wt 159.0 lb

## 2017-03-22 DIAGNOSIS — F32A Depression, unspecified: Secondary | ICD-10-CM

## 2017-03-22 DIAGNOSIS — R002 Palpitations: Secondary | ICD-10-CM

## 2017-03-22 DIAGNOSIS — E1169 Type 2 diabetes mellitus with other specified complication: Secondary | ICD-10-CM | POA: Insufficient documentation

## 2017-03-22 DIAGNOSIS — F329 Major depressive disorder, single episode, unspecified: Secondary | ICD-10-CM

## 2017-03-22 DIAGNOSIS — E785 Hyperlipidemia, unspecified: Secondary | ICD-10-CM | POA: Diagnosis not present

## 2017-03-22 DIAGNOSIS — E119 Type 2 diabetes mellitus without complications: Secondary | ICD-10-CM | POA: Diagnosis not present

## 2017-03-22 DIAGNOSIS — F419 Anxiety disorder, unspecified: Secondary | ICD-10-CM | POA: Diagnosis not present

## 2017-03-22 DIAGNOSIS — R0609 Other forms of dyspnea: Secondary | ICD-10-CM | POA: Diagnosis not present

## 2017-03-22 DIAGNOSIS — R0600 Dyspnea, unspecified: Secondary | ICD-10-CM

## 2017-03-22 DIAGNOSIS — Z79899 Other long term (current) drug therapy: Secondary | ICD-10-CM | POA: Diagnosis not present

## 2017-03-22 LAB — POCT GLYCOSYLATED HEMOGLOBIN (HGB A1C): Hemoglobin A1C: 6.7

## 2017-03-22 MED ORDER — METFORMIN HCL ER 500 MG PO TB24: 500.0000 mg | ORAL_TABLET | Freq: Every day | ORAL | 11 refills | Status: DC

## 2017-03-22 MED ORDER — ROSUVASTATIN CALCIUM 10 MG PO TABS: 10.0000 mg | ORAL_TABLET | Freq: Every day | ORAL | 2 refills | Status: DC

## 2017-03-22 NOTE — Patient Instructions (Signed)
Stop Pravastatin. Start generic Crestor.

## 2017-03-23 ENCOUNTER — Encounter: Payer: Self-pay | Admitting: Nurse Practitioner

## 2017-03-23 NOTE — Progress Notes (Signed)
Tried calling, no answer.CS 03/23/2017

## 2017-03-23 NOTE — Progress Notes (Signed)
Subjective:  Presents for routine follow-up on her diabetes. Compliant with medications. Is interested in switching her cholesterol medicine since she is not at goal. Currently on pravastatin. Has stopped citalopram and restarted Lexapro now that she has insurance. This is working much better. Has experienced increased shortness of breath with activity such as walking up steps from her basement. Slight sweating. No nausea. No chest discomfort with activity. Has had some sharp pains just left of the epigastric area very sporadic only lasting a few seconds. Has a history of GERD. Is followed by GI specialist. Taking atenolol to control palpitations which have been stable. Former smoker quit in 1984. Has had an abdominal hysterectomy with right oophorectomy. [facility] her father died of an MI at age 43; both parents had hyperlipidemia and hypertension. Currently on tamoxifen after being treated for breast cancer.  Objective:   BP 112/74   Ht 5' (1.524 m)   Wt 159 lb (72.1 kg)   LMP 12/04/2008   BMI 31.05 kg/m  NAD. Alert, oriented. Lungs clear. Heart regular rate rhythm. No murmur or gallop noted. Pulse 68. EKG shows bradycardia with a rate of 49 otherwise normal. Abdomen soft nondistended with distinct epigastric area tenderness. Lower extremities no edema.  Assessment:   Problem List Items Addressed This Visit      Endocrine   Hyperlipidemia associated with type 2 diabetes mellitus ([place])   Relevant Medications   metFORMIN (GLUCOPHAGE-XR) 500 MG 24 hr tablet   rosuvastatin (CRESTOR) 10 MG tablet   Other Relevant Orders   Lipid panel     Other   Palpitations   Relevant Orders   PR ELECTROCARDIOGRAM, COMPLETE    Other Visit Diagnoses    Diabetes mellitus without complication ([place])    -  Primary   Relevant Medications   metFORMIN (GLUCOPHAGE-XR) 500 MG 24 hr tablet   rosuvastatin (CRESTOR) 10 MG tablet   Other Relevant Orders   POCT glycosylated hemoglobin (Hb A1C) (Completed)   Dyspnea on  exertion       Relevant Orders   PR ELECTROCARDIOGRAM, COMPLETE   Ambulatory referral to Cardiology   High risk medication use       Relevant Orders   Hepatic function panel       Plan:   Meds ordered this encounter  Medications  . metFORMIN (GLUCOPHAGE-XR) 500 MG 24 hr tablet    Sig: Take 1 tablet (500 mg total) by mouth daily with breakfast.    Dispense:  30 tablet    Refill:  11    Order Specific Question:   Supervising Provider    Answer:   Mikey Kirschner [[id number]]  . rosuvastatin (CRESTOR) 10 MG tablet    Sig: Take 1 tablet (10 mg total) by mouth daily. For cholesterol    Dispense:  30 tablet    Refill:  2    Order Specific Question:   Supervising Provider    Answer:   Mikey Kirschner [[id number]]  . escitalopram (LEXAPRO) 20 MG tablet    Sig: Take 20 mg by mouth daily.   Stop pravastatin area. Switch to Crestor. Repeat lipid and liver profiles in 8-10 weeks. Call back if any problems. Refer to cardiology. Start daily aspirin. Warning signs reviewed. Will have patient cut back atenolol dose to 1/2 tab BID.  Continue follow up with GI specialist regarding GERD. Patient states she has had her labs done through Dr. Loanne Drilling. A review of his notes in May only indicates a Hemoglobin A1c. Will have the  nurses and urine microalbumin ratio to her next lab work. Return in about 4 months (around 07/22/2017) for recheck.

## 2017-03-30 DIAGNOSIS — L219 Seborrheic dermatitis, unspecified: Secondary | ICD-10-CM | POA: Diagnosis not present

## 2017-03-30 DIAGNOSIS — Z7984 Long term (current) use of oral hypoglycemic drugs: Secondary | ICD-10-CM | POA: Diagnosis not present

## 2017-03-30 DIAGNOSIS — E119 Type 2 diabetes mellitus without complications: Secondary | ICD-10-CM | POA: Diagnosis not present

## 2017-03-30 DIAGNOSIS — Z23 Encounter for immunization: Secondary | ICD-10-CM | POA: Diagnosis not present

## 2017-03-30 DIAGNOSIS — H2513 Age-related nuclear cataract, bilateral: Secondary | ICD-10-CM | POA: Diagnosis not present

## 2017-03-30 LAB — HM DIABETES EYE EXAM

## 2017-03-31 ENCOUNTER — Telehealth: Payer: Self-pay | Admitting: Family Medicine

## 2017-03-31 NOTE — Telephone Encounter (Signed)
Pt dropped off a handicap placard form to be filled out. Form is in nurse box.

## 2017-03-31 NOTE — Telephone Encounter (Signed)
Form is in yellow folder in Dr.Steve Luking's office

## 2017-04-05 ENCOUNTER — Encounter: Payer: Self-pay | Admitting: Cardiovascular Disease

## 2017-04-05 ENCOUNTER — Ambulatory Visit (INDEPENDENT_AMBULATORY_CARE_PROVIDER_SITE_OTHER): Payer: Medicare Other | Admitting: Cardiovascular Disease

## 2017-04-05 VITALS — BP 144/82 | HR 64 | Ht 61.0 in | Wt 159.0 lb

## 2017-04-05 DIAGNOSIS — R002 Palpitations: Secondary | ICD-10-CM | POA: Diagnosis not present

## 2017-04-05 DIAGNOSIS — R0609 Other forms of dyspnea: Secondary | ICD-10-CM

## 2017-04-05 DIAGNOSIS — R0600 Dyspnea, unspecified: Secondary | ICD-10-CM

## 2017-04-05 DIAGNOSIS — E785 Hyperlipidemia, unspecified: Secondary | ICD-10-CM

## 2017-04-05 NOTE — Addendum Note (Signed)
Addended by: Debbora Lacrosse R on: 04/05/2017 02:19 PM   Modules accepted: Orders

## 2017-04-05 NOTE — Patient Instructions (Addendum)
Medication Instructions:  Your physician recommends that you continue on your current medications as directed. Please refer to the Current Medication list given to you today.   Labwork: NONE  Testing/Procedures: Your physician has requested that you have an echocardiogram. Echocardiography is a painless test that uses sound waves to create images of your heart. It provides your doctor with information about the size and shape of your heart and how well your heart's chambers and valves are working. This procedure takes approximately one hour. There are no restrictions for this procedure.  Your physician has requested that you have a lexiscan myoview. For further information please visit [email or web address]. Please follow instruction sheet, as given.      Follow-Up: Your physician recommends that you schedule a follow-up appointment in: 1 MONTH    Any Other Special Instructions Will Be Listed Below (If Applicable).     If you need a refill on your cardiac medications before your next appointment, please call your pharmacy.

## 2017-04-05 NOTE — Progress Notes (Signed)
CARDIOLOGY CONSULT NOTE  Patient ID: [name] [name] MRN: [id number] DOB/AGE: [birth year] 56 y.o.  Admit date: (Not on file) Primary Physician: Mikey Kirschner, MD Referring Physician: Pearson Forster NP  Reason for Consultation: Exertional dyspnea  HPI: [name] [name] is a 56 y.o. female who is being seen today for the evaluation of exertional dyspnea at the request of Nilda Simmer, NP.   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  Chest CT 07/31/16 did not mention any evidence of coronary artery calcifications.  Echocardiogram on 06/04/14 demonstrated normal left ventricular systolic function and grade 1 diastolic dysfunction, LVEF 55-60%.  ECG performed by PCP on 03/22/17 which I personally interpreted demonstrated sinus rhythm with some mild T-wave flattening in V2 and V3.  For the past few months she has been experiencing exertional dyspnea. She notices it when walking down stairs or when doing laundry. She has occasional retrosternal chest heaviness particularly when she is lying down. Chest heaviness is nonexertional. She denies leg swelling, orthopnea, and paroxysmal nocturnal dyspnea. She has had some mild lightheadedness for the past few months corresponding with exertional dyspnea. She denies dizziness and syncope. She occasionally has sharp left inframammary pains lasting 5 or 6 seconds. This is also nonexertional.  She has a history of severe left hip bursitis and receives injections for this.   Allergies  Allergen Reactions  . Doxycycline Itching and Swelling    Current Outpatient Prescriptions  Medication Sig Dispense Refill  . acetaminophen (TYLENOL) 500 MG tablet Take 500 mg by mouth every 6 (six) hours as needed for moderate pain.     Marland Kitchen ALPRAZolam (XANAX) 1 MG tablet TAKE 1 TABLET BY MOUTH 3 TIMES A DAY AS NEEDED FOR  ANXIETY 60 tablet 5  . atenolol (TENORMIN) 50 MG tablet Take 1 tablet (50 mg total) by mouth 2 (two) times daily. 60 tablet 2  . BAYER CONTOUR NEXT TEST test strip TEST ONCE DAILY AS DIRECTED 50 each 2  . blood glucose meter kit and supplies KIT Dispense based on patient and insurance preference. Use as directed by physician. Diabetes type 2. E11.9 1 each 0  . diphenhydramine-acetaminophen (TYLENOL PM) 25-500 MG TABS Take 2 tablets by mouth at bedtime.    Marland Kitchen escitalopram (LEXAPRO) 20 MG tablet Take 20 mg by mouth daily.    Marland Kitchen FLECTOR 1.3 % PTCH APPLY 1 PATCH ON FOR 12 HOURS AND OFF FOR 12 HOURS  1  . metFORMIN (GLUCOPHAGE-XR) 500 MG 24 hr tablet Take 1 tablet (500 mg total) by mouth daily with breakfast. 30 tablet 11  . pantoprazole (PROTONIX) 40 MG tablet Take 1 tablet (40 mg total) by mouth 2 (two) times daily. 60 tablet 2  . rosuvastatin (CRESTOR) 10 MG tablet Take 1 tablet (10 mg total) by mouth daily. For cholesterol 30 tablet 2  . tamoxifen (NOLVADEX) 10 MG tablet Take 10 mg by mouth 2 (two) times daily.    . valACYclovir (VALTREX) 1000 MG tablet TAKE 1 TABLET (1,000 MG TOTAL) BY MOUTH DAILY. 30 tablet 2   No current facility-administered medications for this visit.     Past Medical History:  Diagnosis Date  . Allergic rhinitis   . Anemia, unspecified 05/30/2013  . Anxiety   . Breast cancer ([place]) 03/31/13   left  . Cancer ([place])   . Chest pain 2009   Consultation-[facility], negative chest CT; nl  echo in 2005; h/o palpitations  . Colitis 2010   not IBD  . Degenerative joint disease    + degenerative joint disease of the lumbosacral spine  . Depression   . Diabetes ([place])   . Diabetes ([place])   . Dysrhythmia    palpations  . GERD (gastroesophageal reflux disease)    "a little"  . Headache   . Heart murmur    "small"  . Herpes simplex type II infection   . Hypercholesteremia    "slightly high"  . Hypertension    does not have high blood pressure  . 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  Palpitations   . Personal history of chemotherapy   . Personal history of radiation therapy   . Wears glasses     Past Surgical History:  Procedure Laterality Date  . ABDOMINAL HYSTERECTOMY  03/13/2007   TAH ?BSO--Dr Levin Bacon, [place]  . BREAST EXCISIONAL BIOPSY  04/2003   Left; benign disease  . BREAST LUMPECTOMY Left 09/2013  . BREAST LUMPECTOMY WITH NEEDLE LOCALIZATION AND AXILLARY LYMPH NODE DISSECTION Left 09/12/2013   Procedure: BREAST LUMPECTOMY WITH NEEDLE LOCALIZATION AND AXILLARY LYMPH NODE DISSECTION;  Surgeon: [name], MD;  Location: [facility];  Service: General;  Laterality: Left;  and axilla  . BREAST SURGERY    . COLONOSCOPY  10/2010   proctitis; melanosis coli  . EYE SURGERY Left    "fix lazy eye"  . PORTACATH PLACEMENT Right 04/21/2013   Procedure: INSERTION PORT-A-CATH;  Surgeon: [name], MD;  Location: [facility];  Service: General;  Laterality: Right;  . RIGHT OOPHORECTOMY  03/13/2007  . SUBOCCIPITAL CRANIECTOMY CERVICAL LAMINECTOMY N/A 05/10/2014   Procedure: SUBOCCIPITAL CRANIECTOMY CERVICAL LAMINECTOMY/DURAPLASTY;  Surgeon: [name], MD;  Location: [facility];  Service: Neurosurgery;  Laterality: N/A;  suboccipital craniectomy with cervical laminectomy and duraplasty  . TUBAL LIGATION      Social History   Social History  . Marital status: Married    Spouse name: N/A  . Number of children: 4  . Years of education: N/A   Occupational History  . Factory work [employer]   Social History Main Topics  . Smoking status: Former Smoker    Packs/day: 0.50    Years: 15.00    Types: Cigarettes    Quit date: 07/06/1982  . Smokeless tobacco: Never Used  . Alcohol use No  . Drug use: No  . Sexual activity: Not Currently    Partners: Male    Birth control/ protection: Surgical     Comment: TAH   Other Topics Concern  . Not on file   Social History Narrative  . No narrative on file     No family history of  premature CAD in 1st degree relatives.  Current Meds  Medication Sig  . acetaminophen (TYLENOL) 500 MG tablet Take 500 mg by mouth every 6 (six) hours as needed for moderate pain.   Marland Kitchen ALPRAZolam (XANAX) 1 MG tablet TAKE 1 TABLET BY MOUTH 3 TIMES A DAY AS NEEDED FOR ANXIETY  . atenolol (TENORMIN) 50 MG tablet Take 1 tablet (50 mg total) by mouth 2 (two) times daily.  Marland Kitchen BAYER CONTOUR NEXT TEST test strip TEST ONCE DAILY AS DIRECTED  . blood glucose meter kit and supplies KIT Dispense based on patient and insurance preference. Use as directed by physician. Diabetes type 2. E11.9  . diphenhydramine-acetaminophen (TYLENOL PM) 25-500 MG TABS Take 2 tablets by mouth at bedtime.  Marland Kitchen escitalopram (  LEXAPRO) 20 MG tablet Take 20 mg by mouth daily.  Marland Kitchen FLECTOR 1.3 % PTCH APPLY 1 PATCH ON FOR 12 HOURS AND OFF FOR 12 HOURS  . metFORMIN (GLUCOPHAGE-XR) 500 MG 24 hr tablet Take 1 tablet (500 mg total) by mouth daily with breakfast.  . pantoprazole (PROTONIX) 40 MG tablet Take 1 tablet (40 mg total) by mouth 2 (two) times daily.  . rosuvastatin (CRESTOR) 10 MG tablet Take 1 tablet (10 mg total) by mouth daily. For cholesterol  . tamoxifen (NOLVADEX) 10 MG tablet Take 10 mg by mouth 2 (two) times daily.  . valACYclovir (VALTREX) 1000 MG tablet TAKE 1 TABLET (1,000 MG TOTAL) BY MOUTH DAILY.      Review of systems complete and found to be negative unless listed above in HPI    Physical exam Blood pressure (!) 144/82, pulse 64, height '5\' 1"'  (1.549 m), weight 159 lb (72.1 kg), last menstrual period 12/04/2008, SpO2 98 %. General: NAD Neck: No JVD, no thyromegaly or thyroid nodule.  Lungs: Clear to auscultation bilaterally with normal respiratory effort. CV: Nondisplaced PMI. Regular rate and rhythm, normal S1/S2, no F7/C9, 1/6 systolic murmur over LUSB.  No peripheral edema.  No carotid bruit.    Abdomen: Soft, nontender, no distention.  Skin: Intact without lesions or rashes.  Neurologic: Alert and oriented  x 3.  Psych: Normal affect. Extremities: No clubbing or cyanosis.  HEENT: Normal.   ECG: Most recent ECG reviewed.   Labs: Lab Results  Component Value Date/Time   K 3.8 07/23/2016 10:18 AM   BUN 10.6 07/23/2016 10:18 AM   CREATININE 0.9 07/23/2016 10:18 AM   ALT 12 07/23/2016 10:18 AM   TSH 1.120 02/28/2016 11:59 AM   HGB 11.8 07/23/2016 10:18 AM     Lipids: Lab Results  Component Value Date/Time   LDLCALC 143 (H) 02/28/2016 11:59 AM   CHOL 236 (H) 02/28/2016 11:59 AM   TRIG 68 02/28/2016 11:59 AM   HDL 79 02/28/2016 11:59 AM        ASSESSMENT AND PLAN:   1. Exertional dyspnea: Cardiovascular risk factors include hyperlipidemia, diabetes, and prior history of tobacco use. No wheezing or crackles on physical exam to suggest a pulmonary pathology. She quit smoking in the 1980s. She has severe left hip bursitis limiting her ability to walk. I will proceed with a nuclear myocardial perfusion imaging study to evaluate for ischemic heart disease (Lexiscan Myoview). Given her history of Herceptin use, I will obtain an echocardiogram to evaluate cardiac structure and function.  2. Palpitations: Symptomatically stable on atenolol. No changes.  3. Hyperlipidemia: Continue statin therapy.    Disposition: Follow up in 1 month  Signed: Kate Sable, M.D., F.A.C.C.  04/05/2017, 2:12 PM

## 2017-04-06 ENCOUNTER — Encounter (HOSPITAL_COMMUNITY): Payer: Self-pay | Admitting: Emergency Medicine

## 2017-04-06 ENCOUNTER — Emergency Department (HOSPITAL_COMMUNITY)
Admission: EM | Admit: 2017-04-06 | Discharge: 2017-04-06 | Disposition: A | Discharge status: Home / Self Care | Payer: Medicare Other | Attending: Emergency Medicine | Admitting: Emergency Medicine

## 2017-04-06 ENCOUNTER — Encounter: Payer: Self-pay | Admitting: *Deleted

## 2017-04-06 DIAGNOSIS — R21 Rash and other nonspecific skin eruption: Secondary | ICD-10-CM | POA: Diagnosis present

## 2017-04-06 DIAGNOSIS — R220 Localized swelling, mass and lump, head: Secondary | ICD-10-CM | POA: Diagnosis not present

## 2017-04-06 DIAGNOSIS — E119 Type 2 diabetes mellitus without complications: Secondary | ICD-10-CM | POA: Insufficient documentation

## 2017-04-06 DIAGNOSIS — Z79899 Other long term (current) drug therapy: Secondary | ICD-10-CM | POA: Diagnosis not present

## 2017-04-06 DIAGNOSIS — R609 Edema, unspecified: Secondary | ICD-10-CM | POA: Diagnosis not present

## 2017-04-06 DIAGNOSIS — I10 Essential (primary) hypertension: Secondary | ICD-10-CM | POA: Diagnosis not present

## 2017-04-06 DIAGNOSIS — Z7984 Long term (current) use of oral hypoglycemic drugs: Secondary | ICD-10-CM | POA: Insufficient documentation

## 2017-04-06 DIAGNOSIS — Z87891 Personal history of nicotine dependence: Secondary | ICD-10-CM | POA: Insufficient documentation

## 2017-04-06 MED ORDER — EPINEPHRINE 0.3 MG/0.3ML IJ SOAJ: 0.3000 mg | Freq: Once | INTRAMUSCULAR | 1 refills | Status: AC

## 2017-04-06 NOTE — ED Triage Notes (Signed)
Pt reports bumpy rash to the left side of her face which developed 1 week ago. Given metronizadole cream by dermatologist this week. Reports facial swelling this morning. No swelling noted to this RN. Airway intact. VSS.

## 2017-04-06 NOTE — Discharge Instructions (Signed)
Please read attached information. If you experience any new or worsening signs or symptoms please return to the emergency room for evaluation. Please follow-up with your primary care provider or specialist as discussed. Please use medication prescribed only as directed and discontinue taking if you have any concerning signs or symptoms.   °

## 2017-04-06 NOTE — ED Provider Notes (Signed)
[facility] DEPT Provider Note   CSN: [id number] Arrival date & time: 04/06/17  1631     History   Chief Complaint Chief Complaint  Patient presents with  . Rash    HPI [name] [name] is a 56 y.o. female.  HPI   56 year old female presents today with complaints of facial swelling.  Patient notes that over the last month she has had a rash on the left side of her face.  She was seen by dermatology and started on ketoconazole.  She notes this morning after starting her day she had swelling to bilateral cheeks and upper part of her face.  She notes this is abnormal with no history of the same.  She denies any rash associated with it, denies any swelling of the tongue lips mouth or throat, denies any shortness of breath.  She did not take any medication prior to arrival.  She notes that prior to my evaluation symptoms have dramatically improved.  Patient notes she has been tested for allergies in the past and was allergic to dust mites.  She was given EpiPen as she has had anaphylactic type reactions in the past.  Patient denies any change in body care products, household products or foods, or environmental exposures.    Past Medical History:  Diagnosis Date  . Allergic rhinitis   . Anemia, unspecified 05/30/2013  . Anxiety   . Breast cancer ([place]) 03/31/13   left  . Cancer ([place])   . Chest pain 2009   Consultation-Rothbart, negative chest CT; nl echo in 2005; h/o palpitations  . Colitis 2010   not IBD  . Degenerative joint disease    + degenerative joint disease of the lumbosacral spine  . Depression   . Diabetes ([place])   . Diabetes ([place])   . Dysrhythmia    palpations  . GERD (gastroesophageal reflux disease)    "a little"  . Headache   . Heart murmur    "small"  . Herpes simplex type II infection   . Hypercholesteremia    "slightly high"  . Hypertension    does not have high blood pressure  . 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 Palpitations   . Personal history of  chemotherapy   . Personal history of radiation therapy   . Wears glasses     Patient Active Problem List   Diagnosis Date Noted  . Hyperlipidemia associated with type 2 diabetes mellitus ([place]) 03/22/2017  . Genetic testing 07/01/2016  . Lipoma of back 06/03/2016  . Reactive hypoglycemia 06/03/2016  . Family history of breast cancer in female 05/22/2016  . Diabetes ([place])   . Constipation 12/10/2015  . Depression 08/23/2014  . Anxiety 08/23/2014  . Heart murmur 08/23/2014  . Hypercholesteremia 08/23/2014  . History of Chiari malformation 08/23/2014  . 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 Fever   . Vaginal dryness 07/16/2014  . 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 Joint pain 10/02/2013  . Neuropathic pain 10/02/2013  . Onychomycosis of toenail 10/02/2013  . Frequent headaches 06/19/2013  . Anxiety as acute reaction to exceptional stress 05/22/2013  . Malignant neoplasm of upper-outer quadrant of left breast in female, estrogen receptor positive ([place]) 04/06/2013  . 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 Obesity (BMI 30-39.9) 12/14/2012  . GERD (gastroesophageal reflux disease) 12/14/2012  . Chronic depression 12/14/2012  . Heart murmur, systolic 28/63/8177  . Essential hypertension 09/02/2012  . Chest pain   . Palpitations   . DEGENERATIVE JOINT DISEASE, RIGHT KNEE 10/03/2009  . [place] DEGENERATION 10/24/2007  Past Surgical History:  Procedure Laterality Date  . ABDOMINAL HYSTERECTOMY  03/13/2007   TAH ?BSO--Dr Levin Bacon, [place]  . BREAST EXCISIONAL BIOPSY  04/2003   Left; benign disease  . BREAST LUMPECTOMY Left 09/2013  . BREAST LUMPECTOMY WITH NEEDLE LOCALIZATION AND AXILLARY LYMPH NODE DISSECTION Left 09/12/2013   Procedure: BREAST LUMPECTOMY WITH NEEDLE LOCALIZATION AND AXILLARY LYMPH NODE DISSECTION;  Surgeon: [name], MD;  Location: [facility];  Service: General;  Laterality: Left;  and axilla  . BREAST SURGERY    . COLONOSCOPY  10/2010    proctitis; melanosis coli  . EYE SURGERY Left    "fix lazy eye"  . PORTACATH PLACEMENT Right 04/21/2013   Procedure: INSERTION PORT-A-CATH;  Surgeon: [name], MD;  [facility];  Service: General;  Laterality: Right;  . RIGHT OOPHORECTOMY  03/13/2007  . SUBOCCIPITAL CRANIECTOMY CERVICAL LAMINECTOMY N/A 05/10/2014   Procedure: SUBOCCIPITAL CRANIECTOMY CERVICAL LAMINECTOMY/DURAPLASTY;  Surgeon: [name], MD;  Location: [facility];  Service: Neurosurgery;  Laterality: N/A;  suboccipital craniectomy with cervical laminectomy and duraplasty  . TUBAL LIGATION      OB History    Gravida Para Term Preterm AB Living   '4 4 4     4   ' SAB TAB Ectopic Multiple Live Births           4       Home Medications    Prior to Admission medications   Medication Sig Start Date End Date Taking? Authorizing Provider  acetaminophen (TYLENOL) 500 MG tablet Take 500 mg by mouth every 6 (six) hours as needed for moderate pain.     [provider]  ALPRAZolam Duanne Moron) 1 MG tablet TAKE 1 TABLET BY MOUTH 3 TIMES A DAY AS NEEDED FOR ANXIETY 12/11/16   Nilda Simmer, NP  atenolol (TENORMIN) 50 MG tablet Take 1 tablet (50 mg total) by mouth 2 (two) times daily. 01/29/17   Mikey Kirschner, MD  BAYER CONTOUR NEXT TEST test strip TEST ONCE DAILY AS DIRECTED 07/17/16   Mikey Kirschner, MD  blood glucose meter kit and supplies KIT Dispense based on patient and insurance preference. Use as directed by physician. Diabetes type 2. E11.9 03/03/16   Mikey Kirschner, MD  diphenhydramine-acetaminophen (TYLENOL PM) 25-500 MG TABS Take 2 tablets by mouth at bedtime.    [provider]  EPINEPHrine 0.3 mg/0.3 mL IJ SOAJ injection Inject 0.3 mLs (0.3 mg total) into the muscle once. 04/06/17 04/06/17  Ceazia Harb, Dellis Filbert, PA-C  escitalopram (LEXAPRO) 20 MG tablet Take 20 mg by mouth daily.    [provider]  FLECTOR 1.3 % PTCH APPLY 1 PATCH ON FOR 12 HOURS AND OFF FOR 12 HOURS 03/25/17    [provider]  metFORMIN (GLUCOPHAGE-XR) 500 MG 24 hr tablet Take 1 tablet (500 mg total) by mouth daily with breakfast. 03/22/17   Nilda Simmer, NP  pantoprazole (PROTONIX) 40 MG tablet Take 1 tablet (40 mg total) by mouth 2 (two) times daily. 05/01/16   Mikey Kirschner, MD  rosuvastatin (CRESTOR) 10 MG tablet Take 1 tablet (10 mg total) by mouth daily. For cholesterol 03/22/17   Nilda Simmer, NP  tamoxifen (NOLVADEX) 10 MG tablet Take 10 mg by mouth 2 (two) times daily.    [provider]  valACYclovir (VALTREX) 1000 MG tablet TAKE 1 TABLET (1,000 MG TOTAL) BY MOUTH DAILY. 07/29/16   Nilda Simmer, NP    Family History Family  History  Problem Relation Age of Onset  . Aneurysm Mother        Cerebral  . Hypertension Mother   . Hyperlipidemia Mother   . Stroke Mother   . Coronary artery disease Father   . Hypertension Father   . Hyperlipidemia Father   . Heart attack Father        d. 18  . Lung cancer Father        dx early 23s; former smoker  . Lung cancer Brother   . Lung cancer Brother        d. 19y; smoker  . Lung cancer Sister        paternal half-sister; not a smoker; dx <60; d. 60y  . Leukemia Maternal Aunt        "blood cancer"; d. unspecified age  . Cervical cancer Paternal Aunt        d. late 38s  . Breast cancer Sister 66  . Other Sister        hx of hysterectomy for unspecified reason  . Multiple myeloma Brother        d. 59s  . Lung cancer Brother        dx late 73s; smoker  . Prostate cancer Brother        dx late 42s  . Prostate cancer Brother        dx late 75s  . Lung cancer Brother   . Lung cancer Maternal Aunt        d. unspecified age  . Breast cancer Maternal Aunt        dx unspecified age  . Prostate cancer Cousin        maternal 1st cousin; dx older age  . Breast cancer Other        niece dx early 46s or before  . Leukemia Other        nephew d. 2y; "blood cancer"  . Colon cancer Neg Hx   . Diabetes Neg  Hx     Social History Social History  Substance Use Topics  . Smoking status: Former Smoker    Packs/day: 0.50    Years: 15.00    Types: Cigarettes    Quit date: 07/06/1982  . Smokeless tobacco: Never Used  . Alcohol use No     Allergies   Doxycycline   Review of Systems Review of Systems  All other systems reviewed and are negative.    Physical Exam Updated Vital Signs BP (!) 110/55 (BP Location: Right Arm)   Pulse (!) 56   Temp 98.4 F (36.9 C) (Oral)   Resp 16   Ht '5\' 1"'  (1.549 m)   Wt 72.1 kg (159 lb)   LMP 12/04/2008   SpO2 99%   BMI 30.04 kg/m   Physical Exam  Constitutional: She is oriented to person, place, and time. She appears well-developed and well-nourished.  HENT:  Head: Normocephalic and atraumatic.  No facial swelling or edema, no swelling to the tongue mouth oropharynx  Eyes: Pupils are equal, round, and reactive to light. Conjunctivae are normal. Right eye exhibits no discharge. Left eye exhibits no discharge. No scleral icterus.  Neck: Normal range of motion. No JVD present. No tracheal deviation present.  Pulmonary/Chest: Effort normal and breath sounds normal. No stridor. No respiratory distress. She has no wheezes. She has no rales. She exhibits no tenderness.  Neurological: She is alert and oriented to person, place, and time. Coordination normal.  Skin:  Red papular rash to  left nasolabial fold-no facial swelling or edema  Psychiatric: She has a normal mood and affect. Her behavior is normal. Judgment and thought content normal.  Nursing note and vitals reviewed.    ED Treatments / Results  Labs (all labs ordered are listed, but only abnormal results are displayed) Labs Reviewed - No data to display  EKG  EKG Interpretation None       Radiology No results found.  Procedures Procedures (including critical care time)  Medications Ordered in ED Medications - No data to display   Initial Impression / Assessment and Plan /  ED Course  I have reviewed the triage vital signs and the nursing notes.  Pertinent labs & imaging results that were available during my care of the patient were reviewed by me and considered in my medical decision making (see chart for details).      Final Clinical Impressions(s) / ED Diagnoses   Final diagnoses:  Facial swelling    Labs:   Imaging:  Consults:  Therapeutics:  Discharge Meds: EpiPen  Assessment/Plan: 56 year old female presents today with complaints of facial swelling.  By the time of my evaluation she had no significant swelling.  Patient is uncertain as to what caused this, with no symptoms at my evaluation who instructed use Benadryl and/or Claritin as needed if symptoms return, return to emergency room for any swelling of the respiratory tract.  Patient will be given prescription for EpiPen that she has had swelling of her lips in the past unrelated to today's complaint.  Patient will continue outpatient follow-up for her rash with her dermatologist.  Strict return precautions given, she verbalized understanding and agreement to today's plan had no further questions or concerns.      New Prescriptions Discharge Medication List as of 04/06/2017  7:52 PM    START taking these medications   Details  EPINEPHrine 0.3 mg/0.3 mL IJ SOAJ injection Inject 0.3 mLs (0.3 mg total) into the muscle once., Starting Tue 04/06/2017, Print         Lamyia Cdebaca, [place], PA-C 04/06/17 2010    Daleen Bo, MD 04/06/17 2328

## 2017-04-07 ENCOUNTER — Telehealth: Payer: Self-pay | Admitting: Family Medicine

## 2017-04-07 ENCOUNTER — Other Ambulatory Visit: Payer: Self-pay | Admitting: Nurse Practitioner

## 2017-04-07 MED ORDER — VALACYCLOVIR HCL 1 G PO TABS: 1000.0000 mg | ORAL_TABLET | Freq: Every day | ORAL | 2 refills | Status: DC

## 2017-04-07 NOTE — Telephone Encounter (Signed)
Pt is needing a refill on valACYclovir (VALTREX) 1000 MG tablet     [facility]

## 2017-04-14 ENCOUNTER — Encounter (HOSPITAL_COMMUNITY): Payer: Self-pay

## 2017-04-14 ENCOUNTER — Ambulatory Visit (HOSPITAL_COMMUNITY)
Admission: RE | Admit: 2017-04-14 | Discharge: 2017-04-14 | Disposition: A | Discharge status: Home / Self Care | Payer: Medicare Other | Source: Ambulatory Visit | Attending: Cardiovascular Disease | Admitting: Cardiovascular Disease

## 2017-04-14 ENCOUNTER — Inpatient Hospital Stay (HOSPITAL_COMMUNITY): Admission: RE | Admit: 2017-04-14 | Payer: Medicare Other | Source: Ambulatory Visit

## 2017-04-14 DIAGNOSIS — R0609 Other forms of dyspnea: Secondary | ICD-10-CM | POA: Diagnosis not present

## 2017-04-14 DIAGNOSIS — I119 Hypertensive heart disease without heart failure: Secondary | ICD-10-CM | POA: Insufficient documentation

## 2017-04-14 DIAGNOSIS — E119 Type 2 diabetes mellitus without complications: Secondary | ICD-10-CM | POA: Insufficient documentation

## 2017-04-14 DIAGNOSIS — E785 Hyperlipidemia, unspecified: Secondary | ICD-10-CM | POA: Insufficient documentation

## 2017-04-14 DIAGNOSIS — Z87891 Personal history of nicotine dependence: Secondary | ICD-10-CM | POA: Insufficient documentation

## 2017-04-14 DIAGNOSIS — R0600 Dyspnea, unspecified: Secondary | ICD-10-CM

## 2017-04-14 LAB — NM MYOCAR MULTI W/SPECT W/WALL MOTION / EF
LV dias vol: 59 mL (ref 46–106)
LV sys vol: 17 mL
Peak HR: 98 {beats}/min
RATE: 0.36
Rest HR: 52 {beats}/min
SDS: 1
SRS: 1
SSS: 2
TID: 1.08

## 2017-04-14 MED ORDER — TECHNETIUM TC 99M TETROFOSMIN IV KIT
10.0000 | PACK | Freq: Once | INTRAVENOUS | Status: AC | PRN
  Administered 2017-04-14: 11 via INTRAVENOUS

## 2017-04-14 MED ORDER — SODIUM CHLORIDE 0.9% FLUSH
INTRAVENOUS | Status: AC
  Administered 2017-04-14: 10 mL via INTRAVENOUS
  Filled 2017-04-14: qty 10

## 2017-04-14 MED ORDER — REGADENOSON 0.4 MG/5ML IV SOLN
INTRAVENOUS | Status: AC
Start: 2017-04-14 — End: 2017-04-14
  Administered 2017-04-14: 0.4 mg via INTRAVENOUS
  Filled 2017-04-14: qty 5

## 2017-04-14 MED ORDER — TECHNETIUM TC 99M TETROFOSMIN IV KIT
30.0000 | PACK | Freq: Once | INTRAVENOUS | Status: AC | PRN
  Administered 2017-04-14: 32 via INTRAVENOUS

## 2017-04-14 NOTE — Progress Notes (Signed)
*  PRELIMINARY RESULTS* Echocardiogram 2D Echocardiogram has been performed.  [name] [name] 04/14/2017, 10:51 AM

## 2017-04-21 ENCOUNTER — Ambulatory Visit (INDEPENDENT_AMBULATORY_CARE_PROVIDER_SITE_OTHER): Payer: Medicare Other | Admitting: Gastroenterology

## 2017-04-21 ENCOUNTER — Encounter: Payer: Self-pay | Admitting: Gastroenterology

## 2017-04-21 DIAGNOSIS — K581 Irritable bowel syndrome with constipation: Secondary | ICD-10-CM | POA: Diagnosis not present

## 2017-04-21 DIAGNOSIS — K219 Gastro-esophageal reflux disease without esophagitis: Secondary | ICD-10-CM | POA: Diagnosis not present

## 2017-04-21 MED ORDER — LINACLOTIDE 72 MCG PO CAPS: 72.0000 ug | ORAL_CAPSULE | Freq: Every day | ORAL | 11 refills | Status: DC

## 2017-04-21 NOTE — Progress Notes (Signed)
ON RECALL  °

## 2017-04-21 NOTE — Patient Instructions (Addendum)
TRY TO REDUCE YOUR STRESS VIA MEDITATION OR PRAYER FOR 30 MINS 4-5 TIMES A WEEK. YOU CAN LOOK UP VIDEOS ON YOUTUBE.  DRINK WATER TO KEEP YOUR URINE LIGHT YELLOW.  FOLLOW A LOW FODMAP DIET FOR 6 WEEKS. SEE HANDOUT.   TAKE IBGARD ONE DAILY FOR 12 DAYS.   ADD FIBER TO YOUR DIET. USE FIBER POWDER 1 PACKET, POWDER, OR PILLS ONCE DAILY FOR 3 DAYS THEN TWICE DAILY. AVOID HIGHER DOSE IF IT CAUSED BLOATING & GAS.  ADD LINZESS 30 MINS PRIOR TO BREAKFAST. OPEN LINZESS CAPSULE. PLACE GRANULES IN 4 TEASPOONS OF WATER. STIR IT FOR 30 SECONDS. TAKE 3 TSP OF THE WATER DAILY. YOU DO NOT NEED TO TAKE THE GRANULES THE MEDICINE IS IN THE WATER. YOU SHOULD HAVE A BM 1-3 HRS AFTER THE DOSE IF NOT TAKE THE LINZESS WITH BREAKFAST. IT CAN CAUSE EXPLOSIVE DIARRHEA.   AVOID REFLUX TRIGGERS. SEE INFO BELOW.  CONTINUE PROTONIX. TAKE 30 MINUTES PRIOR TO MEALS TWICE DAILY.   PLEASE CALL IN ONE MONTH IF YOUR SYMPTOMS ARE NOT IMPROVED. OTHERWISE , FOLLOW UP IN 3 MOS.   Irritable Bowel Syndrome (Spastic Colon) Irritable Bowel Syndrome (IBS) is caused by a disturbance of normal bowel function. Other terms used are spastic colon, mucous colitis, and irritable colon. It does not require surgery, nor does it lead to cancer. There is no cure for IBS. But with proper diet, stress reduction, and medication, you will find that your problems (symptoms) will gradually disappear or improve. IBS is a common digestive disorder. It usually appears in late adolescence or early adulthood. Women develop it twice as often as men.  CAUSES After food has been digested and absorbed in the small intestine, waste material is moved into the colon (large intestine). In the colon, water and salts are absorbed from the undigested products coming from the small intestine. The remaining residue, or fecal material, is held for elimination. Under normal circumstances, gentle, rhythmic contractions on the bowel walls push the fecal material along the  colon towards the rectum. In IBS, however, these contractions are irregular and poorly coordinated. The fecal material is either retained too long, resulting in constipation, or expelled too soon, producing diarrhea.  SYMPTOMS  The most common symptom of IBS is pain. It is typically in the lower left side of the belly (abdomen). But it may occur anywhere in the abdomen. It can be felt as heartburn, backache, or even as a dull pain in the arms or shoulders. The pain comes from excessive bowel-muscle spasms and from the buildup of gas and fecal material in the colon. This pain:  Can range from sharp belly (abdominal) cramps to a dull, continuous ache.   Usually worsens soon after eating.   Is typically relieved by having a bowel movement or passing gas.  Abdominal pain is usually accompanied by constipation. But it may also produce diarrhea. The diarrhea typically occurs right after a meal or upon arising in the morning. The stools are typically soft and watery. They are often flecked with secretions (mucus). Other symptoms of IBS include:  Bloating.  Loss of appetite.   Heartburn.  Feeling sick to your stomach  (nausea).   Belching  Vomiting   Gas.  IBS may also cause a number of symptoms that are unrelated to the digestive system:  Fatigue.  Headaches.   Anxiety  Shortness of breath   Difficulty in concentrating.  Dizziness.   These symptoms tend to come and go.  HOME CARE INSTRUCTIONS  Avoid foods that are high in fat or oils. Some examples ESP:QZRAQ cream, butter, frankfurters, sausage, and other fatty meats.   Avoid foods that have a laxative effect, such as fruit, fruit juice, and dairy products.   Cut out carbonated drinks, chewing gum, and "gassy" foods, such as beans and cabbage. This may help relieve bloating and belching.   Bran taken with plenty of liquids may help relieve constipation.   Keep track of what foods seem to trigger your symptoms.   Avoid  emotionally charged situations or circumstances that produce anxiety.   Start or continue exercising.   Get plenty of rest and sleep.     Lifestyle and home remedies TO CONTROL REFLUX You may eliminate or reduce the frequency of heartburn by making the following lifestyle changes:  . Control your weight. Being overweight is a major risk factor for heartburn and GERD. Excess pounds put pressure on your abdomen, pushing up your stomach and causing acid to back up into your esophagus.   . Eat smaller meals. 4 TO 6 MEALS A DAY. This reduces pressure on the lower esophageal sphincter, helping to prevent the valve from opening and acid from washing back into your esophagus.   Dolphus Jenny your belt. Clothes that fit tightly around your waist put pressure on your abdomen and the lower esophageal sphincter.    . Eliminate heartburn triggers. Everyone has specific triggers. Common triggers such as fatty or fried foods, spicy food, tomato sauce, carbonated beverages, alcohol, chocolate, mint, garlic, onion, caffeine and nicotine may make heartburn worse.   Marland Kitchen Avoid stooping or bending. Tying your shoes is OK. Bending over for longer periods to weed your garden isn't, especially soon after eating.   . Don't lie down after a meal. Wait at least three to four hours after eating before going to bed, and don't lie down right after eating.   Alternative medicine . Several home remedies exist for treating GERD, but they provide only temporary relief. They include drinking baking soda (sodium bicarbonate) added to water or drinking other fluids such as baking soda mixed with cream of tartar and water. . Although these liquids create temporary relief by neutralizing, washing away or buffering acids, eventually they aggravate the situation by adding gas and fluid to your stomach, increasing pressure and causing more acid reflux. Further, adding more sodium to your diet may increase your blood pressure and add  stress to your heart, and excessive bicarbonate ingestion can alter the acid-base balance in your body.

## 2017-04-21 NOTE — Progress Notes (Signed)
cc'ed to pcp °

## 2017-04-21 NOTE — Progress Notes (Signed)
Subjective:    Patient ID: [name] [name], female    DOB: 12-23-1960, 56 y.o.   MRN: [id number]  [name] Kirschner, MD   HPI PAIN IN LEFT SIDE AND CONSTIPATION. NEER BEEN A PERSON WITH BOWEL MOVEMENTS ONCE A DAY. HAS THIN CALIBER BOWEL. HAD FOUR KIDS: VAGINALLY. HAS URGE TO HAVE A BM BUT IT DOESN'T COME OUT LIKE SHE DOESN'T HAVE ENOUGH MUSCLES TO PUSH. DRINKS WATER. DOESN'T EAT AS MUCH FIBER AS SHE SHOULD. AMITIZA UNPREDICTABLE SOMETIMES IT WORK AND SOMETIME EXPLOSIVE WATERY DIARRHEA. LINZESS SAME THING. HAS FAMILY STRESS.  STOMACH CURRENTLY UPSET AND TAKING PEPTO FOR ABOUT 2 WEEKS. NAUSEA: OFF AND ON. VOMITING: 1-2X/WEEK(NO BLOOD). LUQ DISCOMFORT(SHARP AND THEN TWISTING BACK TO HER BACK, LAST 5-6 SECS AND THEN GOES AWAY). NO DYSURIA OR HEMATURIA. TAKING LOW DOSE ASA DAILY DUE TO SOB/FAMH Hx.  PT DENIES FEVER, CHILLS, HEMATOCHEZIA, HEMATEMESIS, melena, diarrhea, CHEST PAIN, SHORTNESS OF BREATH,  CHANGE IN BOWEL IN HABITS,  problems swallowing, problems with sedation, OR heartburn or indigestion.   Past Medical History:  Diagnosis Date  . Allergic rhinitis   . Anemia, unspecified 05/30/2013  . Anxiety   . Breast cancer ([place]) 03/31/13   left  . Cancer ([place])   . Chest pain 2009   Consultation-Rothbart, negative chest CT; nl echo in 2005; h/o palpitations  . Colitis 2010   not IBD  . Degenerative joint disease    + degenerative joint disease of the lumbosacral spine  . Depression   . Diabetes ([place])   . Diabetes ([place])   . Dysrhythmia    palpations  . GERD (gastroesophageal reflux disease)    "a little"  . Headache   . Heart murmur    "small"  . Herpes simplex type II infection   . Hypercholesteremia    "slightly high"  . Hypertension    does not have high blood pressure  . 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 Palpitations   . Personal history of chemotherapy   . Personal history of radiation therapy   . Wears glasses    Past Surgical History:  Procedure Laterality Date  .  ABDOMINAL HYSTERECTOMY  03/13/2007   TAH ?BSO--Dr Levin Bacon,   . BREAST EXCISIONAL BIOPSY  04/2003   Left; benign disease  . BREAST LUMPECTOMY Left 09/2013  . BREAST LUMPECTOMY WITH NEEDLE LOCALIZATION AND AXILLARY LYMPH NODE DISSECTION Left 09/12/2013   Procedure: BREAST LUMPECTOMY WITH NEEDLE LOCALIZATION AND AXILLARY LYMPH NODE DISSECTION;  Surgeon: [name], MD;  Location: [facility];  Service: General;  Laterality: Left;  and axilla  . BREAST SURGERY    . COLONOSCOPY  10/2010   proctitis; melanosis coli  . EYE SURGERY Left    "fix lazy eye"  . PORTACATH PLACEMENT Right 04/21/2013   Procedure: INSERTION PORT-A-CATH;  Surgeon: [name], MD;  [facility];  Service: General;  Laterality: Right;  . RIGHT OOPHORECTOMY  03/13/2007  . SUBOCCIPITAL CRANIECTOMY CERVICAL LAMINECTOMY N/A 05/10/2014   Procedure: SUBOCCIPITAL CRANIECTOMY CERVICAL LAMINECTOMY/DURAPLASTY;  Surgeon: [name], MD;  Location: [facility];  Service: Neurosurgery;  Laterality: N/A;  suboccipital craniectomy with cervical laminectomy and duraplasty  . TUBAL LIGATION      Allergies  Allergen Reactions  . Doxycycline Itching and Swelling    Current Outpatient Prescriptions  Medication Sig Dispense Refill  . acetaminophen (TYLENOL) 500 MG tablet Take 500 mg by mouth every 6 (six) hours as needed for moderate pain.     Marland Kitchen ALPRAZolam Duanne Moron) 1  MG tablet TAKE 1 TABLET BY MOUTH 3 TIMES A DAY AS NEEDED FOR ANXIETY    . atenolol (TENORMIN) 50 MG tablet Take 1 tablet (50 mg total) by mouth 2 (two) times daily.    Marland Kitchen BAYER CONTOUR NEXT TEST test strip TEST ONCE DAILY AS DIRECTED    . blood glucose meter kit and supplies KIT Dispense based on patient and insurance preference. Use as directed by physician. Diabetes type 2. E11.9    . diphenhydramine-acetaminophen (TYLENOL PM) 25-500 MG TABS Take 2 tablets by mouth at bedtime.    Marland Kitchen FLECTOR 1.3 % PTCH APPLY 1 PATCH ON FOR 12 HOURS AND OFF  FOR 12 HOURS    . metFORMIN (GLUCOPHAGE-XR) 500 MG 24 hr tablet Take 1 tablet (500 mg total) by mouth daily with breakfast.    . pantoprazole (PROTONIX) 40 MG tablet Take 1 tablet (40 mg total) by mouth 2 (two) times daily.    . rosuvastatin (CRESTOR) 10 MG tablet Take 1 tablet (10 mg total) by mouth daily. For cholesterol    . tamoxifen (NOLVADEX) 10 MG tablet Take 10 mg by mouth 2 (two) times daily.    . valACYclovir (VALTREX) 1000 MG tablet Take 1 tablet (1,000 mg total) by mouth daily.    Marland Kitchen escitalopram (LEXAPRO) 20 MG tablet Take 20 mg by mouth daily.     Review of Systems PER HPI OTHERWISE ALL SYSTEMS ARE NEGATIVE.    Objective:   Physical Exam        Assessment & Plan:

## 2017-04-21 NOTE — Assessment & Plan Note (Addendum)
SYMPTOMS NOT IDEALLY CONTROLLED 7 EXACERBATED BY PELVIC FLOOR DYSFUNCTION AND DEPRESSION/STRESS. FAILED AMITIZA IN 2018 AND LINZESS 72 MCG IN NOV 2017. LAST CT OCT 2017: NAIAP.  TRY TO REDUCE YOUR STRESS VIA MEDITATION OR PRAYER FOR 30 MINS 4-5 TIMES A WEEK. YOU CAN LOOK UP VIDEOS ON YOUTUBE.  DRINK WATER TO KEEP YOUR URINE LIGHT YELLOW. FOLLOW A LOW FODMAP DIET FOR 6 WEEKS.  HANDOUT GIVEN. TAKE IBGARD ONE DAILY FOR 12 DAYS. ADD FIBER TO YOUR DIET. USE FIBER POWDER 1 PACKET, POWDER, OR PILLS ONCE DAILY FOR 3 DAYS THEN TWICE DAILY. AVOID HIGHER DOSE IF IT CAUSED BLOATING & GAS. ADD LINZESS 30 MINS PRIOR TO BREAKFAST. OPEN LINZESS CAPSULE. PLACE GRANULES IN 4 TEASPOONS OF WATER. STIR IT FOR 30 SECONDS. TAKE 3 TSP OF THE WATER DAILY. YOU DO NOT NEED TO TAKE THE GRANULES THE MEDICINE IS IN THE WATER. YOU SHOULD HAVE A BM 1-3 HRS AFTER THE DOSE IF NOT TAKE THE LINZESS WITH BREAKFAST. IT CAN CAUSE EXPLOSIVE DIARRHEA.  PLEASE CALL IN ONE MONTH IF YOUR SYMPTOMS ARE NOT IMPROVED. OTHERWISE , FOLLOW UP IN 3 MOS.

## 2017-04-21 NOTE — Assessment & Plan Note (Signed)
SYMPTOMS FAIRLY WELL CONTROLLED.  CONTINUE TO MONITOR SYMPTOMS. AVOID REFLUX TRIGGERS.  HANDOUT GIVEN. CONTINUE PROTONIX. TAKE 30 MINUTES PRIOR TO MEALS TWICE DAILY. FOLLOW UP IN 3 MOS.

## 2017-04-30 ENCOUNTER — Telehealth: Payer: Self-pay | Admitting: Family Medicine

## 2017-04-30 NOTE — Telephone Encounter (Signed)
Requesting Rx for Protonix.  She said she has been waiting for about 3 weeks for this.  [facility]

## 2017-05-02 ENCOUNTER — Other Ambulatory Visit: Payer: Self-pay | Admitting: Nurse Practitioner

## 2017-05-02 MED ORDER — PANTOPRAZOLE SODIUM 40 MG PO TBEC: 40.0000 mg | DELAYED_RELEASE_TABLET | Freq: Two times a day (BID) | ORAL | 5 refills | Status: DC

## 2017-05-02 NOTE — Telephone Encounter (Signed)
Done

## 2017-05-07 ENCOUNTER — Other Ambulatory Visit (HOSPITAL_COMMUNITY)
Admission: RE | Admit: 2017-05-07 | Discharge: 2017-05-07 | Disposition: A | Discharge status: Home / Self Care | Payer: Medicare Other | Source: Ambulatory Visit | Attending: Cardiovascular Disease | Admitting: Cardiovascular Disease

## 2017-05-07 ENCOUNTER — Encounter: Payer: Self-pay | Admitting: Cardiovascular Disease

## 2017-05-07 ENCOUNTER — Ambulatory Visit (INDEPENDENT_AMBULATORY_CARE_PROVIDER_SITE_OTHER): Payer: Medicare Other | Admitting: Cardiovascular Disease

## 2017-05-07 VITALS — BP 108/64 | HR 61 | Ht 61.0 in | Wt 160.0 lb

## 2017-05-07 DIAGNOSIS — Z862 Personal history of diseases of the blood and blood-forming organs and certain disorders involving the immune mechanism: Secondary | ICD-10-CM | POA: Diagnosis not present

## 2017-05-07 DIAGNOSIS — R002 Palpitations: Secondary | ICD-10-CM

## 2017-05-07 DIAGNOSIS — R0602 Shortness of breath: Secondary | ICD-10-CM

## 2017-05-07 DIAGNOSIS — R0609 Other forms of dyspnea: Secondary | ICD-10-CM | POA: Diagnosis not present

## 2017-05-07 DIAGNOSIS — E785 Hyperlipidemia, unspecified: Secondary | ICD-10-CM

## 2017-05-07 DIAGNOSIS — R0600 Dyspnea, unspecified: Secondary | ICD-10-CM

## 2017-05-07 LAB — CBC
HCT: 35.6 % — ABNORMAL LOW (ref 36.0–46.0)
Hemoglobin: 11.7 g/dL — ABNORMAL LOW (ref 12.0–15.0)
MCH: 28.5 pg (ref 26.0–34.0)
MCHC: 32.9 g/dL (ref 30.0–36.0)
MCV: 86.6 fL (ref 78.0–100.0)
Platelets: 158 10*3/uL (ref 150–400)
RBC: 4.11 MIL/uL (ref 3.87–5.11)
RDW: 14 % (ref 11.5–15.5)
WBC: 7.2 10*3/uL (ref 4.0–10.5)

## 2017-05-07 NOTE — Patient Instructions (Addendum)
Your physician recommends that you schedule a follow-up appointment in: 3 months with Dr.Koneswaran     Please schedule spirometry test at front desk.    Your physician recommends that you continue on your current medications as directed. Please refer to the Current Medication list given to you today.    If you need a refill on your cardiac medications before your next appointment, please call your pharmacy.   CBC lab work to be done today       Thank you for choosing [facility] !

## 2017-05-07 NOTE — Addendum Note (Signed)
Addended by: Barbarann Ehlers A on: 05/07/2017 11:40 AM   Modules accepted: Orders

## 2017-05-07 NOTE — Progress Notes (Signed)
SUBJECTIVE: The patient returns for follow-up after undergoing cardiovascular testing performed for the evaluation of exertional dyspnea.  Nuclear stress test did not demonstrate any evidence of myocardial ischemia or scar and was a low risk study, LVEF 71%.  Echocardiogram demonstrated normal cardiac function with mild LVH, LVEF 55-60%.  She continues to have shortness of breath.  She has noticed that her basement is damp and after coming upstairs she is short of breath.  However shortness of breath can occur at other times with exertion.  She currently denies chest pain.  She also denies hematochezia and melena.  She was hospitalized for anemia several years ago.  CBC was normal on 07/23/16.  She denies cough and wheezing.  She is moving into a new house in the next several days.     Review of Systems: As per "subjective", otherwise negative.  Allergies  Allergen Reactions  . Doxycycline Itching and Swelling    Current Outpatient Prescriptions  Medication Sig Dispense Refill  . acetaminophen (TYLENOL) 500 MG tablet Take 500 mg by mouth every 6 (six) hours as needed for moderate pain.     Marland Kitchen ALPRAZolam (XANAX) 1 MG tablet TAKE 1 TABLET BY MOUTH 3 TIMES A DAY AS NEEDED FOR ANXIETY 60 tablet 5  . atenolol (TENORMIN) 50 MG tablet Take 1 tablet (50 mg total) by mouth 2 (two) times daily. 60 tablet 2  . BAYER CONTOUR NEXT TEST test strip TEST ONCE DAILY AS DIRECTED 50 each 2  . blood glucose meter kit and supplies KIT Dispense based on patient and insurance preference. Use as directed by physician. Diabetes type 2. E11.9 1 each 0  . diphenhydramine-acetaminophen (TYLENOL PM) 25-500 MG TABS Take 2 tablets by mouth at bedtime.    Marland Kitchen escitalopram (LEXAPRO) 20 MG tablet Take 20 mg by mouth daily.    Marland Kitchen FLECTOR 1.3 % PTCH APPLY 1 PATCH ON FOR 12 HOURS AND OFF FOR 12 HOURS  1  . linaclotide (LINZESS) 72 MCG capsule Take 1 capsule (72 mcg total) by mouth daily before breakfast. 30  capsule 11  . metFORMIN (GLUCOPHAGE-XR) 500 MG 24 hr tablet Take 1 tablet (500 mg total) by mouth daily with breakfast. 30 tablet 11  . pantoprazole (PROTONIX) 40 MG tablet Take 1 tablet (40 mg total) by mouth 2 (two) times daily. 60 tablet 5  . rosuvastatin (CRESTOR) 10 MG tablet Take 1 tablet (10 mg total) by mouth daily. For cholesterol 30 tablet 2  . tamoxifen (NOLVADEX) 10 MG tablet Take 10 mg by mouth 2 (two) times daily.    . valACYclovir (VALTREX) 1000 MG tablet Take 1 tablet (1,000 mg total) by mouth daily. 30 tablet 2   No current facility-administered medications for this visit.     Past Medical History:  Diagnosis Date  . Allergic rhinitis   . Anemia, unspecified 05/30/2013  . Anxiety   . Breast cancer ([place]) 03/31/13   left  . Cancer ([place])   . Chest pain 2009   Consultation-[facility], negative chest CT; nl echo in 2005; h/o palpitations  . Colitis 2010   not IBD  . Degenerative joint disease    + degenerative joint disease of the lumbosacral spine  . Depression   . Diabetes ([place])   . Diabetes ([place])   . Dysrhythmia    palpations  . GERD (gastroesophageal reflux disease)    "a little"  . Headache   . Heart murmur    "small"  . Herpes simplex type  II infection   . Hypercholesteremia    "slightly high"  . Hypertension    does not have high blood pressure  . 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 Palpitations   . Personal history of chemotherapy   . Personal history of radiation therapy   . Wears glasses     Past Surgical History:  Procedure Laterality Date  . ABDOMINAL HYSTERECTOMY  03/13/2007   TAH ?BSO--Dr Levin Bacon, [place]  . BREAST EXCISIONAL BIOPSY  04/2003   Left; benign disease  . BREAST LUMPECTOMY Left 09/2013  . BREAST LUMPECTOMY WITH NEEDLE LOCALIZATION AND AXILLARY LYMPH NODE DISSECTION Left 09/12/2013   Procedure: BREAST LUMPECTOMY WITH NEEDLE LOCALIZATION AND AXILLARY LYMPH NODE DISSECTION;  Surgeon: [name], MD;  Location: [facility];   Service: General;  Laterality: Left;  and axilla  . BREAST SURGERY    . COLONOSCOPY  10/2010   proctitis; melanosis coli  . EYE SURGERY Left    "fix lazy eye"  . PORTACATH PLACEMENT Right 04/21/2013   Procedure: INSERTION PORT-A-CATH;  Surgeon: [name], MD;  Location: [facility];  Service: General;  Laterality: Right;  . RIGHT OOPHORECTOMY  03/13/2007  . SUBOCCIPITAL CRANIECTOMY CERVICAL LAMINECTOMY N/A 05/10/2014   Procedure: SUBOCCIPITAL CRANIECTOMY CERVICAL LAMINECTOMY/DURAPLASTY;  Surgeon: [name], MD;  Location: [facility];  Service: Neurosurgery;  Laterality: N/A;  suboccipital craniectomy with cervical laminectomy and duraplasty  . TUBAL LIGATION      Social History   Social History  . Marital status: Married    Spouse name: N/A  . Number of children: 4  . Years of education: N/A   Occupational History  . Factory work [employer]   Social History Main Topics  . Smoking status: Former Smoker    Packs/day: 0.50    Years: 15.00    Types: Cigarettes    Quit date: 07/06/1982  . Smokeless tobacco: Never Used  . Alcohol use No  . Drug use: No  . Sexual activity: Not Currently    Partners: Male    Birth control/ protection: Surgical     Comment: TAH   Other Topics Concern  . Not on file   Social History Narrative  . No narrative on file     There were no vitals filed for this visit.  Wt Readings from Last 3 Encounters:  04/21/17 158 lb (71.7 kg)  04/06/17 159 lb (72.1 kg)  04/05/17 159 lb (72.1 kg)     PHYSICAL EXAM General: NAD HEENT: Normal. Neck: No JVD, no thyromegaly. Lungs: Clear to auscultation bilaterally with normal respiratory effort. CV: Regular rate and rhythm, normal S1/S2, no H4/R7, 1/6 systolic murmur over LUSB. No pretibial or periankle edema.    Abdomen: Soft, nontender, no distention.  Neurologic: Alert and oriented.  Psych: Normal affect. Skin: Normal. Musculoskeletal: No gross deformities.    ECG: Most recent ECG  reviewed.   Labs: Lab Results  Component Value Date/Time   K 3.8 07/23/2016 10:18 AM   BUN 10.6 07/23/2016 10:18 AM   CREATININE 0.9 07/23/2016 10:18 AM   ALT 12 07/23/2016 10:18 AM   TSH 1.120 02/28/2016 11:59 AM   HGB 11.8 07/23/2016 10:18 AM     Lipids: Lab Results  Component Value Date/Time   LDLCALC 143 (H) 02/28/2016 11:59 AM   CHOL 236 (H) 02/28/2016 11:59 AM   TRIG 68 02/28/2016 11:59 AM   HDL 79 02/28/2016 11:59 AM       ASSESSMENT AND PLAN:  1. Exertional  dyspnea: She quit smoking in the 1980s. Nuclear stress test and echocardiogram were normal.  She does have a damp basement and may be getting exposed to mold spores.  She is moving into a new house in the near future.  She has a remote history of anemia.  I will obtain a CBC and spirometry.  2. Palpitations: Symptomatically stable on atenolol. No changes.  3. Hyperlipidemia: Continue statin therapy.      Disposition: Follow up 3 months   Kate Sable, M.D., F.A.C.C.

## 2017-05-12 ENCOUNTER — Ambulatory Visit (HOSPITAL_COMMUNITY)
Admission: RE | Admit: 2017-05-12 | Discharge: 2017-05-12 | Disposition: A | Discharge status: Home / Self Care | Payer: Medicare Other | Source: Ambulatory Visit | Attending: Cardiovascular Disease | Admitting: Cardiovascular Disease

## 2017-05-12 DIAGNOSIS — F17210 Nicotine dependence, cigarettes, uncomplicated: Secondary | ICD-10-CM | POA: Insufficient documentation

## 2017-05-12 DIAGNOSIS — R0602 Shortness of breath: Secondary | ICD-10-CM | POA: Insufficient documentation

## 2017-05-12 LAB — SPIROMETRY WITH GRAPH
FEF 25-75 Pre: 2.57 L/sec
FEF2575-%Pred-Pre: 128 %
FEV1-%Pred-Pre: 96 %
FEV1-Pre: 1.86 L
FEV1FVC-%Pred-Pre: 108 %
FEV6-%Pred-Pre: 91 %
FEV6-Pre: 2.14 L
FEV6FVC-%Pred-Pre: 104 %
FVC-%Pred-Pre: 88 %
FVC-Pre: 2.14 L
Pre FEV1/FVC ratio: 87 %
Pre FEV6/FVC Ratio: 100 %

## 2017-05-13 ENCOUNTER — Ambulatory Visit: Payer: Self-pay | Admitting: Endocrinology

## 2017-05-13 DIAGNOSIS — L219 Seborrheic dermatitis, unspecified: Secondary | ICD-10-CM | POA: Diagnosis not present

## 2017-05-13 DIAGNOSIS — L304 Erythema intertrigo: Secondary | ICD-10-CM | POA: Diagnosis not present

## 2017-05-14 ENCOUNTER — Other Ambulatory Visit (INDEPENDENT_AMBULATORY_CARE_PROVIDER_SITE_OTHER): Payer: Medicare Other

## 2017-05-14 ENCOUNTER — Ambulatory Visit (INDEPENDENT_AMBULATORY_CARE_PROVIDER_SITE_OTHER): Payer: Medicare Other | Admitting: Endocrinology

## 2017-05-14 ENCOUNTER — Encounter: Payer: Self-pay | Admitting: Endocrinology

## 2017-05-14 VITALS — BP 124/68 | HR 52 | Wt 160.0 lb

## 2017-05-14 DIAGNOSIS — R202 Paresthesia of skin: Secondary | ICD-10-CM | POA: Insufficient documentation

## 2017-05-14 DIAGNOSIS — E119 Type 2 diabetes mellitus without complications: Secondary | ICD-10-CM

## 2017-05-14 LAB — POCT GLYCOSYLATED HEMOGLOBIN (HGB A1C): Hemoglobin A1C: 5.5

## 2017-05-14 LAB — VITAMIN B12: Vitamin B-12: 539 pg/mL (ref 211–911)

## 2017-05-14 LAB — TSH: TSH: 1.15 u[IU]/mL (ref 0.35–4.50)

## 2017-05-14 LAB — SEDIMENTATION RATE: Sed Rate: 6 mm/hr (ref 0–30)

## 2017-05-14 MED ORDER — METFORMIN HCL ER 500 MG PO TB24: 500.0000 mg | ORAL_TABLET | ORAL | 5 refills | Status: DC

## 2017-05-14 NOTE — Progress Notes (Signed)
Subjective:    Patient ID: [name] [name], female    DOB: Feb 26, 1961, 56 y.o.   MRN: [id number]  HPI Pt returns for f/u of diabetes mellitus: DM type: 2 Dx'ed: 7517 Complications: none Therapy: metformin GDM: never DKA: never Severe hypoglycemia: never Pancreatitis: never Other: she also has h/o reactive hypoglycemia; she has never been on insulin. Interval history: She takes metformin as rx'ed.  pt states few weeks of moderate tingling of the feet, but no assoc pain.   Past Medical History:  Diagnosis Date  . Allergic rhinitis   . Anemia, unspecified 05/30/2013  . Anxiety   . Breast cancer ([place]) 03/31/13   left  . Cancer ([place])   . Chest pain 2009   Consultation-[facility], negative chest CT; nl echo in 2005; h/o palpitations  . Colitis 2010   not IBD  . Degenerative joint disease    + degenerative joint disease of the lumbosacral spine  . Depression   . Diabetes ([place])   . Diabetes ([place])   . Dysrhythmia    palpations  . GERD (gastroesophageal reflux disease)    "a little"  . Headache   . Heart murmur    "small"  . Herpes simplex type II infection   . Hypercholesteremia    "slightly high"  . Hypertension    does not have high blood pressure  . 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 Palpitations   . Personal history of chemotherapy   . Personal history of radiation therapy   . Wears glasses     Past Surgical History:  Procedure Laterality Date  . ABDOMINAL HYSTERECTOMY  03/13/2007   TAH ?BSO--Dr Levin Bacon, [place]  . BREAST EXCISIONAL BIOPSY  04/2003   Left; benign disease  . BREAST LUMPECTOMY Left 09/2013  . BREAST SURGERY    . COLONOSCOPY  10/2010   proctitis; melanosis coli  . EYE SURGERY Left    "fix lazy eye"  . RIGHT OOPHORECTOMY  03/13/2007  . TUBAL LIGATION      Social History   Socioeconomic History  . Marital status: Married    Spouse name: Not on file  . Number of children: 4  . Years of education: Not on file  . Highest education level: Not on file    Social Needs  . Financial resource strain: Not on file  . Food insecurity - worry: Not on file  . Food insecurity - inability: Not on file  . Transportation needs - medical: Not on file  . Transportation needs - non-medical: Not on file  Occupational History  . 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 Smoking status: Former Smoker    Packs/day: 0.50    Years: 15.00    Pack years: 7.50    Types: Cigarettes    Last attempt to quit: 07/06/1982    Years since quitting: 34.8  . Smokeless tobacco: Never Used  Substance and Sexual Activity  . Alcohol use: No  . Drug use: No  . Sexual activity: Not Currently    Partners: Male    Birth control/protection: Surgical    Comment: TAH  Other Topics Concern  . Not on file  Social History Narrative  . Not on file    Current Outpatient Medications on File Prior to Visit  Medication Sig Dispense Refill  . acetaminophen (TYLENOL) 500 MG tablet Take 500 mg by mouth every 6 (six) hours as needed for moderate pain.     Marland Kitchen ALPRAZolam (XANAX) 1 MG  tablet TAKE 1 TABLET BY MOUTH 3 TIMES A DAY AS NEEDED FOR ANXIETY 60 tablet 5  . atenolol (TENORMIN) 50 MG tablet Take 1 tablet (50 mg total) by mouth 2 (two) times daily. 60 tablet 2  . BAYER CONTOUR NEXT TEST test strip TEST ONCE DAILY AS DIRECTED 50 each 2  . blood glucose meter kit and supplies KIT Dispense based on patient and insurance preference. Use as directed by physician. Diabetes type 2. E11.9 1 each 0  . diphenhydramine-acetaminophen (TYLENOL PM) 25-500 MG TABS Take 2 tablets by mouth at bedtime.    Marland Kitchen escitalopram (LEXAPRO) 20 MG tablet Take 20 mg by mouth daily.    Marland Kitchen FLECTOR 1.3 % PTCH APPLY 1 PATCH ON FOR 12 HOURS AND OFF FOR 12 HOURS  1  . linaclotide (LINZESS) 72 MCG capsule Take 1 capsule (72 mcg total) by mouth daily before breakfast. 30 capsule 11  . pantoprazole (PROTONIX) 40 MG tablet Take 1 tablet (40 mg total) by mouth 2 (two) times daily. 60 tablet 5   . rosuvastatin (CRESTOR) 10 MG tablet Take 1 tablet (10 mg total) by mouth daily. For cholesterol 30 tablet 2  . tamoxifen (NOLVADEX) 10 MG tablet Take 10 mg by mouth 2 (two) times daily.    . valACYclovir (VALTREX) 1000 MG tablet Take 1 tablet (1,000 mg total) by mouth daily. 30 tablet 2  . [DISCONTINUED] potassium chloride SA (K-DUR,KLOR-CON) 20 MEQ tablet Take 1 tablet (20 mEq total) by mouth 2 (two) times daily. 20 tablet 1   No current facility-administered medications on file prior to visit.     Allergies  Allergen Reactions  . Doxycycline Itching and Swelling    Family History  Problem Relation Age of Onset  . Aneurysm Mother        Cerebral  . Hypertension Mother   . Hyperlipidemia Mother   . Stroke Mother   . Coronary artery disease Father   . Hypertension Father   . Hyperlipidemia Father   . Heart attack Father        d. 11  . Lung cancer Father        dx early 81s; former smoker  . Lung cancer Brother   . Lung cancer Brother        d. 23y; smoker  . Lung cancer Sister        paternal half-sister; not a smoker; dx <60; d. 69y  . Leukemia Maternal Aunt        "blood cancer"; d. unspecified age  . Cervical cancer Paternal Aunt        d. late 88s  . Breast cancer Sister 73  . Other Sister        hx of hysterectomy for unspecified reason  . Multiple myeloma Brother        d. 93s  . Lung cancer Brother        dx late 78s; smoker  . Prostate cancer Brother        dx late 15s  . Prostate cancer Brother        dx late 41s  . Lung cancer Brother   . Lung cancer Maternal Aunt        d. unspecified age  . Breast cancer Maternal Aunt        dx unspecified age  . Prostate cancer Cousin        maternal 1st cousin; dx older age  . Breast cancer Other        niece  dx early 50s or before  . Leukemia Other        nephew d. 2y; "blood cancer"  . Colon cancer Neg Hx   . Diabetes Neg Hx     BP 124/68 (BP Location: Right Arm, Patient Position: Sitting, Cuff Size:  Normal)   Pulse (!) 52   Wt 160 lb (72.6 kg)   LMP 12/04/2008   SpO2 99%   BMI 30.23 kg/m    Review of Systems She has occasional mild hypoglycemia (60).  Denies LOC    Objective:   Physical Exam VITAL SIGNS:  See vs page GENERAL: no distress Pulses: foot pulses are intact bilaterally.   MSK: no deformity of the feet or ankles.  CV: no edema of the legs or ankles Skin:  no ulcer on the feet or ankles.  normal color and temp on the feet and ankles Neuro: sensation is intact to touch on the feet and ankles.    Lab Results  Component Value Date   HGBA1C 5.5 05/14/2017       Assessment & Plan:  Type 2 DM: well-controlled.  Paresthesias, new, uncertain etiology.  Patient Instructions  check your blood sugar once a week.  vary the time of day when you check, between before the 3 meals, and at bedtime.  also check if you have symptoms of your blood sugar being too high or too low.  please keep a record of the readings and bring it to your next appointment here (or you can bring the meter itself).  You can write it on any piece of paper.  please call us sooner if your blood sugar goes below 70, or if you have a lot of readings over 200.   Please reduce the metformin to every other day.  blood tests are requested for you today.  We'll let you know about the results. Please call or message [place] if you want to have the nerve endings checked at a neurologist's office. Please come back for a follow-up appointment in 6 months.

## 2017-05-14 NOTE — Patient Instructions (Addendum)
check your blood sugar once a week.  vary the time of day when you check, between before the 3 meals, and at bedtime.  also check if you have symptoms of your blood sugar being too high or too low.  please keep a record of the readings and bring it to your next appointment here (or you can bring the meter itself).  You can write it on any piece of paper.  please call us sooner if your blood sugar goes below 70, or if you have a lot of readings over 200.   Please reduce the metformin to every other day.  blood tests are requested for you today.  We'll let you know about the results. Please call or message [place] if you want to have the nerve endings checked at a neurologist's office. Please come back for a follow-up appointment in 6 months.

## 2017-05-18 NOTE — Progress Notes (Signed)
ID: [name] [name] OB: 1961-05-06  MR#: [id number]  SRP#:[id number]  PCP: Mikey Kirschner, MD ; Pearson Forster, NP GYN:   Josefa Half, MD  SU: Alphonsa Overall, MD [place]:  Thea Silversmith, MD OTHER MD: Freida Busman, MD;  Neysa Bonito, MD, Sharman Crate MD  CHIEF COMPLAINT:  Left Breast Cancer  CURRENT TREATMENT: Tamoxifen  BREAST CANCER HISTORY: From the original intake note 04/12/2013:  [name] [name] palpated a mass in her left breast in late August 2014. She was already scheduled for screening mammography at [facility] and this was performed 03/13/2013. Indeed a possible mass was noted in the left breast and on 03/29/2013 the patient underwent diagnostic left mammography and left ultrasonography. This showed a 2.5 cm irregular mass in the upper outer quadrant of the left breast. This was palpable.by ultrasound there was a 1.6 cm irregular hypoechoic mass in the area in question, and in addition to enlarged left axillary lymph nodes were noted, the largest measuring 2.5 cm.  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  Bilateral breast MRI 04/11/2013 shows 3 abnormal enhancing masses in the upper outer quadrant of the left breast measuring altogether 6.4 cm. The largest separate mass measured 2.7 cm. There were abnormal or large lymph nodes in the left axilla. Ultrasound guided biopsy of the 2 smaller masses has been scheduled.  The patient's subsequent history is as detailed below   INTERVAL HISTORY: [name] [name] returns today for follow-up and treatment of her estrogen receptor positive breast cancer.  She continues on tamoxifen, with good tolerance. She  reports having hot flashes that are not intense. She denies an increase in vaginal discharge, but notes that she is actually feeling more vaginal dryness.  Since her last visit to the office, on 12/28/2016 she had a bilateral diagnostic mammography with ultrasonography and ultrasonography of right axilla, showing: No evidence of malignancy in the left breast. In the region of patient concern, stable lumpectomy changes and a small postoperative seroma are imaged. No evidence malignancy in the right breast. No evidence of right axillary lymphadenopathy or mass.   She also had an echocardiogram completed on 04/14/2017 due to dyspnea on exertion with results showing: No diagnostic ST segment changes to indicate ischemia. Small, mild intensity, apical defect that is fixed and consistent with soft tissue attenuation. This is a low risk study. Nuclear stress EF: 71%.   REVIEW OF SYSTEMS: [name] [name] reports having and increase of SOB and mild right mid-back pain. She notes that she recently had a large lipoma removed from her back. She noticed the pain a few weeks ago. She notes that she hasn't changed her usual daily activities and that she is not straining herself. She reports that she has SOB when she tries to vacuum and climb the stairs. She also reports general mild ache and pains. She reports that she and her family are moving to [place], [place] soon. She reports that their current house has a leak in their basement.She denies unusual headaches, visual changes, nausea, vomiting, or dizziness. There has been no unusual cough, phlegm production, or pleurisy. This been no change in bowel or bladder habits. She denies unexplained fatigue  or unexplained weight loss, bleeding, rash, or fever. A detailed review of systems was otherwise stable.    PAST MEDICAL HISTORY: Past Medical History:  Diagnosis Date  . Allergic rhinitis   . Anemia, unspecified 05/30/2013  . Anxiety   . Breast cancer ([place]) 03/31/13   left   . Cancer ([place])   . Chest pain 2009   Consultation-[facility], negative chest CT; nl echo in 2005; h/o palpitations  . Colitis 2010   not IBD  . Degenerative joint disease    + degenerative joint disease of the lumbosacral spine  . Depression   . Diabetes ([place])   . Diabetes ([place])   . Dysrhythmia    palpations  . GERD (gastroesophageal reflux disease)    "a little"  . Headache   . Heart murmur    "small"  . Herpes simplex type II infection   . Hypercholesteremia    "slightly high"  . Hypertension    does not have high blood pressure  . 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 Palpitations   . Personal history of chemotherapy   . Personal history of radiation therapy   . Wears glasses     PAST SURGICAL HISTORY: Past Surgical History:  Procedure Laterality Date  . ABDOMINAL HYSTERECTOMY  03/13/2007   TAH ?BSO--Dr Levin Bacon, [place]  . BREAST EXCISIONAL BIOPSY  04/2003   Left; benign disease  . BREAST LUMPECTOMY Left 09/2013  . BREAST SURGERY    . COLONOSCOPY  10/2010   proctitis; melanosis coli  . EYE SURGERY Left    "fix lazy eye"  . RIGHT OOPHORECTOMY  03/13/2007  . TUBAL LIGATION      FAMILY HISTORY Family History  Problem Relation Age of Onset  . Aneurysm Mother        Cerebral  . Hypertension Mother   . Hyperlipidemia Mother   . Stroke Mother   . Coronary artery disease Father   . Hypertension Father   . Hyperlipidemia Father   . Heart attack Father        d. 48  . Lung cancer Father        dx early 26s; former smoker  . Lung cancer Brother   . Lung cancer Brother        d. 71y; smoker  . Lung cancer Sister        paternal half-sister; not a smoker; dx <60; d. 49y  . Leukemia Maternal Aunt        "blood cancer"; d. unspecified age  . Cervical cancer Paternal Aunt        d. late 73s  . Breast cancer Sister 19  . Other Sister        hx of hysterectomy for unspecified reason  . Multiple myeloma Brother        d. 36s  . Lung cancer Brother        dx late 7s;  smoker  . Prostate cancer Brother        dx late 47s  . Prostate cancer Brother        dx late 38s  . Lung cancer Brother   . Lung cancer Maternal Aunt        d. unspecified age  . Breast cancer Maternal Aunt        dx unspecified age  . Prostate cancer Cousin        maternal 1st cousin; dx older age  . Breast cancer Other        niece dx early 93s  or before  . Leukemia Other        nephew d. 2y; "blood cancer"  . Colon cancer Neg Hx   . Diabetes Neg Hx   The patient's father died at the age of 20 from a myocardial infarction. The patient's mother died at the age of 37 from a stroke. The patient had 6 brothers, 2 sisters. One brother has a history of lung cancer in the setting of tobacco abuse. Another brother had multiple myeloma. One sister was diagnosed with breast cancer at the age of 56   GYNECOLOGIC HISTORY: (Updated 09/25/2013) Menarche age 44, first live birth age 17, the patient is [name] [name] P4. She status post abdominal hysterectomy with bilateral salpingo-oophorectomy. She never took hormone replacement.   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 Her husband [name] works in Charity fundraiser. Daughter [name] formerly lived at home with the patient with her 3 children, all under the age of 36; they have subsequently moved out. She is not employed. Son Lagina Reader lives in [place] and is not employed. Daughter Maggie Schwalbe Dillard lives in [place] and is a Barrister's clerk. Daughter Ernst Spell is a Education officer, museum in [place]. The patient has 10 grandchildren. She attends [employer]     ADVANCED DIRECTIVES: Not in place   HEALTH MAINTENANCE:  (Updated 09/25/2013)  Social History   Tobacco Use  . Smoking status: Former Smoker    Packs/day: 0.50    Years: 15.00    Pack years: 7.50    Types: Cigarettes    Last attempt to quit: 07/06/1982    Years since quitting: 34.8  .  Smokeless tobacco: Never Used  Substance Use Topics  . Alcohol use: No  . Drug use: No     Colonoscopy:2012  PAP:2014   Bone density: Never  Lipid panel: Not on file    Allergies  Allergen Reactions  . Doxycycline Itching and Swelling    Current Outpatient Medications  Medication Sig Dispense Refill  . acetaminophen (TYLENOL) 500 MG tablet Take 500 mg by mouth every 6 (six) hours as needed for moderate pain.     Marland Kitchen ALPRAZolam (XANAX) 1 MG tablet TAKE 1 TABLET BY MOUTH 3 TIMES A DAY AS NEEDED FOR ANXIETY 60 tablet 5  . atenolol (TENORMIN) 50 MG tablet Take 1 tablet (50 mg total) by mouth 2 (two) times daily. 60 tablet 2  . BAYER CONTOUR NEXT TEST test strip TEST ONCE DAILY AS DIRECTED 50 each 2  . blood glucose meter kit and supplies KIT Dispense based on patient and insurance preference. Use as directed by physician. Diabetes type 2. E11.9 1 each 0  . diphenhydramine-acetaminophen (TYLENOL PM) 25-500 MG TABS Take 2 tablets by mouth at bedtime.    Marland Kitchen escitalopram (LEXAPRO) 20 MG tablet Take 20 mg by mouth daily.    Marland Kitchen FLECTOR 1.3 % PTCH APPLY 1 PATCH ON FOR 12 HOURS AND OFF FOR 12 HOURS  1  . linaclotide (LINZESS) 72 MCG capsule Take 1 capsule (72 mcg total) by mouth daily before breakfast. 30 capsule 11  . metFORMIN (GLUCOPHAGE-XR) 500 MG 24 hr tablet Take 1 tablet (500 mg total) every other day by mouth. 30 tablet 5  . pantoprazole (PROTONIX) 40 MG tablet Take 1 tablet (40 mg total) by mouth 2 (two) times daily. 60 tablet 5  . rosuvastatin (CRESTOR) 10 MG tablet Take 1 tablet (10 mg total)  by mouth daily. For cholesterol 30 tablet 2  . tamoxifen (NOLVADEX) 10 MG tablet Take 1 tablet (10 mg total) 2 (two) times daily by mouth. 90 tablet 4  . valACYclovir (VALTREX) 1000 MG tablet Take 1 tablet (1,000 mg total) by mouth daily. 30 tablet 2   No current facility-administered medications for this visit.     OBJECTIVE: Middle-aged [place] American woman in no acute distress  Vitals:    05/19/17 1351  BP: 140/65  Pulse: 64  Resp: 20  Temp: 97.7 F (36.5 C)  SpO2: (!) 20%  Body mass index is 30.57 kg/m.  ECOG: 1 Filed Weights   05/19/17 1351  Weight: 161 lb 12.8 oz (73.4 kg)   Sclerae unicteric, EOMs intact Oropharynx clear and moist No cervical or supraclavicular adenopathy Lungs no rales or rhonchi Heart regular rate and rhythm Abd soft, nontender, positive bowel sounds MSK no focal spinal tenderness, no upper extremity lymphedema; there is slight focal tenderness in the lower aspect of the right scapular area, with no associated mass or area Neuro: nonfocal, well oriented, depressed affect Breasts: The right breast is benign.  The left breast is status post lumpectomy and radiation.  There is still mild hyperpigmentation.  There is significant sensitivity to touch in the axilla area.  There is no evidence of disease recurrence.  The left breast is benign.  LAB RESULTS:   Lab Results  Component Value Date   WBC 8.2 05/19/2017   NEUTROABS 5.5 05/19/2017   HGB 11.4 (L) 05/19/2017   HCT 35.0 05/19/2017   MCV 86.8 05/19/2017   PLT 161 05/19/2017      Chemistry      Component Value Date/Time   NA 143 07/23/2016 1018   K 3.8 07/23/2016 1018   CL 103 04/15/2016 1403   CO2 26 07/23/2016 1018   BUN 10.6 07/23/2016 1018   CREATININE 0.9 07/23/2016 1018      Component Value Date/Time   CALCIUM 9.4 07/23/2016 1018   ALKPHOS 52 07/23/2016 1018   AST 14 07/23/2016 1018   ALT 12 07/23/2016 1018   BILITOT 0.39 07/23/2016 1018         STUDIES: Since her last visit to the office, On 12/28/2016 she had a bilateral diagnostic mammography with ultrasonography and ultrasonography of right axilla, showing: No evidence of malignancy in the left breast. In the region of patient concern, stable lumpectomy changes and a small postoperative seroma are imaged. No evidence malignancy in the right breast. No evidence of right axillary lymphadenopathy or mass.   She also  had an echocardiogram completed on 04/14/2017 due to dyspnea on exertion with results showing: No diagnostic ST segment changes to indicate ischemia. Small, mild intensity, apical defect that is fixed and consistent with soft tissue attenuation. This is a low risk study. Nuclear stress EF: 71%.   ASSESSMENT/PLAN: 56 y.o. [name] [name], [place] woman   (1)  status post left breast upper outer quadrant biopsy 03/29/2013 for a clinical T2-T3 pN1, stage IIB-IIIA invasive ductal carcinoma, grade 2-3, estrogen receptor 39% positive (with moderate staining intensity), progesterone receptor negative, with an MIB-1 of 83%, and HER-2 amplified by CISH, with a ratio of 7.5 and 15 HER-2 copies per cell  (2) neoadjuvant chemotherapy, with trastuzumab/ pertuzumab/ carboplatin/ docetaxel repeated every 3 weeks x6, completed 08/21/2013. Pertuzumab  held after 4 cycles secondary to diarrhea. The patient received Neulasta on day 2 at [facility] per her request.   (3) Trastuzumab continued for total of one year (  last dose 04/13/2014)). Most recent echo 06/04/2014 showed a well preserved ejection fraction.  (4)  status post left lumpectomy and axillary node dissection 09/12/2013, with a complete pathologic response (a total of 16 lymph nodes were examined,1 sentinel and 15 non-sentinel),  YpT0, ypN0.  (5) adjuvant radiation at [facility] completed 12/08/2013  (6) started anastrozole 01/16/2014  (a) bone density 04/09/2016 shows osteopenia with a T score of -1.7   (b) switched to tamoxifen as of OCT 2017 because of vaginal dryness issues   (i) status post remote total abdominal hysterectomy with right salpingo-oophorectomy  (c) pelvic and transvaginal ultrasound found a normal left ovary, vaginal cuff appeared normal, with no area of suspicious attachment of the bowel to the cuff apex  (7)  genetics testing 06/22/2016 through the 21-gene Custom Cancer Panel through [facility], MD) found no  deleterious mutations in ATM, BARD1, BRCA1, BRCA2, BRIP1, CDH1, CHEK2, EPCAM, FANCC, HOXB13, MLH1, MSH2, MSH6, NBN, PALB2, PMS2, PTEN, RAD51C, RAD51D, TP53, and XRCC2.    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    PLAN: [name] [name] is now 3-1/2 years out from definitive surgery for breast cancer with no evidence of disease recurrence.  This is very favorable.  She continues on tamoxifen with good tolerance.  The plan will be to continue that for a total of 5 years.  I think she will very likely has a musculoskeletal "ache" in the right scapular area, and her shortness of breath is most likely to be due to deconditioning.  Nevertheless were going to obtain a chest x-ray today.  If there is any abnormality we will proceed to a CT scan  Otherwise she will see me again in 1 year.  She knows to call for any problems that may develop before then  Magrinat, Virgie Dad, MD  05/19/17 2:06 PM Medical Oncology and Hematology [facility] [place], [place] [address] Tel. [phone]    Fax. [phone]    This document serves as a record of services personally performed by Lurline Del, MD. It was created on his behalf by Sheron Nightingale, a trained medical scribe. The creation of this record is based on the scribe's personal observations and the provider's statements to them.    I have reviewed the above documentation for accuracy and completeness, and I agree with the above.

## 2017-05-19 ENCOUNTER — Other Ambulatory Visit: Payer: Self-pay | Admitting: Nurse Practitioner

## 2017-05-19 ENCOUNTER — Ambulatory Visit (HOSPITAL_BASED_OUTPATIENT_CLINIC_OR_DEPARTMENT_OTHER): Payer: Medicare Other | Admitting: Oncology

## 2017-05-19 ENCOUNTER — Telehealth: Payer: Self-pay | Admitting: Oncology

## 2017-05-19 ENCOUNTER — Ambulatory Visit (HOSPITAL_COMMUNITY)
Admission: RE | Admit: 2017-05-19 | Discharge: 2017-05-19 | Disposition: A | Discharge status: Home / Self Care | Payer: Medicare Other | Source: Ambulatory Visit | Attending: Oncology | Admitting: Oncology

## 2017-05-19 ENCOUNTER — Other Ambulatory Visit (HOSPITAL_BASED_OUTPATIENT_CLINIC_OR_DEPARTMENT_OTHER): Payer: Medicare Other

## 2017-05-19 ENCOUNTER — Telehealth: Payer: Self-pay | Admitting: Family Medicine

## 2017-05-19 VITALS — BP 140/65 | HR 64 | Temp 97.7°F | Resp 20 | Ht 61.0 in | Wt 161.8 lb

## 2017-05-19 DIAGNOSIS — C50412 Malignant neoplasm of upper-outer quadrant of left female breast: Secondary | ICD-10-CM | POA: Diagnosis not present

## 2017-05-19 DIAGNOSIS — R0602 Shortness of breath: Secondary | ICD-10-CM | POA: Diagnosis not present

## 2017-05-19 DIAGNOSIS — Z170 Estrogen receptor positive status [ER+]: Secondary | ICD-10-CM | POA: Diagnosis not present

## 2017-05-19 DIAGNOSIS — Z79811 Long term (current) use of aromatase inhibitors: Secondary | ICD-10-CM | POA: Diagnosis not present

## 2017-05-19 LAB — CBC WITH DIFFERENTIAL/PLATELET
BASO%: 0.5 % (ref 0.0–2.0)
Basophils Absolute: 0 10*3/uL (ref 0.0–0.1)
EOS%: 0.3 % (ref 0.0–7.0)
Eosinophils Absolute: 0 10*3/uL (ref 0.0–0.5)
HCT: 35 % (ref 34.8–46.6)
HGB: 11.4 g/dL — ABNORMAL LOW (ref 11.6–15.9)
LYMPH%: 26.7 % (ref 14.0–49.7)
MCH: 28.3 pg (ref 25.1–34.0)
MCHC: 32.5 g/dL (ref 31.5–36.0)
MCV: 86.8 fL (ref 79.5–101.0)
MONO#: 0.4 10*3/uL (ref 0.1–0.9)
MONO%: 5.5 % (ref 0.0–14.0)
NEUT#: 5.5 10*3/uL (ref 1.5–6.5)
NEUT%: 67 % (ref 38.4–76.8)
Platelets: 161 10*3/uL (ref 145–400)
RBC: 4.03 10*6/uL (ref 3.70–5.45)
RDW: 14.5 % (ref 11.2–14.5)
WBC: 8.2 10*3/uL (ref 3.9–10.3)
lymph#: 2.2 10*3/uL (ref 0.9–3.3)

## 2017-05-19 LAB — COMPREHENSIVE METABOLIC PANEL
ALT: 14 U/L (ref 0–55)
AST: 16 U/L (ref 5–34)
Albumin: 3.7 g/dL (ref 3.5–5.0)
Alkaline Phosphatase: 41 U/L (ref 40–150)
Anion Gap: 9 mEq/L (ref 3–11)
BUN: 8.2 mg/dL (ref 7.0–26.0)
CO2: 28 mEq/L (ref 22–29)
Calcium: 9.1 mg/dL (ref 8.4–10.4)
Chloride: 110 mEq/L — ABNORMAL HIGH (ref 98–109)
Creatinine: 0.9 mg/dL (ref 0.6–1.1)
EGFR: >60 mL/min/{1.73_m2} (ref >60)
Glucose: 87 mg/dl (ref 70–140)
Potassium: 4 mEq/L (ref 3.5–5.1)
Sodium: 147 mEq/L — ABNORMAL HIGH (ref 136–145)
Total Bilirubin: 0.36 mg/dL (ref 0.20–1.20)
Total Protein: 6.7 g/dL (ref 6.4–8.3)

## 2017-05-19 MED ORDER — ATENOLOL 50 MG PO TABS: 50.0000 mg | ORAL_TABLET | Freq: Two times a day (BID) | ORAL | 2 refills | Status: DC

## 2017-05-19 MED ORDER — TAMOXIFEN CITRATE 10 MG PO TABS: 10.0000 mg | ORAL_TABLET | Freq: Two times a day (BID) | ORAL | 4 refills | Status: DC

## 2017-05-19 NOTE — Telephone Encounter (Signed)
Done. Follow up as planned.  

## 2017-05-19 NOTE — Telephone Encounter (Signed)
Scheduled appt per 11/14 sch msg. Patient did not want avs or calendar.

## 2017-05-19 NOTE — Telephone Encounter (Signed)
Pt had to reschedule her OV here for her BP med check Wants to know if we can refill her atenolol (TENORMIN) 50 MG tablet Until she can be seen [facility]/31/2017  [facility] [place]  Please advise and call pt when done

## 2017-05-19 NOTE — Telephone Encounter (Signed)
Patient notified

## 2017-05-20 ENCOUNTER — Other Ambulatory Visit: Payer: Self-pay

## 2017-05-20 ENCOUNTER — Ambulatory Visit: Payer: Medicare Other | Admitting: Family Medicine

## 2017-05-20 ENCOUNTER — Ambulatory Visit: Payer: Self-pay | Admitting: Oncology

## 2017-05-21 ENCOUNTER — Telehealth: Payer: Self-pay | Admitting: *Deleted

## 2017-05-21 NOTE — Telephone Encounter (Signed)
Called patient with results.  No complaints or questions regarding results.

## 2017-05-31 ENCOUNTER — Ambulatory Visit: Payer: Medicare Other | Admitting: Family Medicine

## 2017-06-16 ENCOUNTER — Encounter: Payer: Self-pay | Admitting: Gastroenterology

## 2017-06-23 ENCOUNTER — Ambulatory Visit (INDEPENDENT_AMBULATORY_CARE_PROVIDER_SITE_OTHER): Payer: Medicare Other | Admitting: Nurse Practitioner

## 2017-06-23 ENCOUNTER — Encounter: Payer: Self-pay | Admitting: Nurse Practitioner

## 2017-06-23 ENCOUNTER — Telehealth: Payer: Self-pay | Admitting: *Deleted

## 2017-06-23 VITALS — BP 122/72 | HR 99 | Ht 61.0 in | Wt 156.4 lb

## 2017-06-23 DIAGNOSIS — Z170 Estrogen receptor positive status [ER+]: Secondary | ICD-10-CM

## 2017-06-23 DIAGNOSIS — N644 Mastodynia: Secondary | ICD-10-CM | POA: Diagnosis not present

## 2017-06-23 DIAGNOSIS — C50412 Malignant neoplasm of upper-outer quadrant of left female breast: Secondary | ICD-10-CM

## 2017-06-23 DIAGNOSIS — N63 Unspecified lump in unspecified breast: Secondary | ICD-10-CM

## 2017-06-23 NOTE — Progress Notes (Signed)
Subjective: Presents for complaints of tenderness and slight swelling in the left breast area that began 3 weeks ago.  No history of injury.  No discharge.  No fever.  Began with some stiffness and soreness in the left arm.  Her last mammogram was in June.  This is the same side that she has had breast cancer on in the past.  Has noticed a small nodule near her surgical scar.  Had recent follow-up with oncology.  Is not due back until next fall.  Objective:   BP 122/72 (BP Location: Left Arm, Patient Position: Sitting)   Pulse 99   Ht 5\' 1"  (1.549 m)   Wt 156 lb 6.4 oz (70.9 kg)   LMP 12/04/2008   SpO2 98%   BMI 29.55 kg/m  NAD.  Alert, oriented.  Lungs clear.  Heart regular rate and rhythm.  Left breast exam mild edema noted along the lower ridge of the breast as compared to the right side.  Tenderness with mild edema noted near surgical scar on the outer part of the left breast with some fine nodularity.  No obvious axillary lymphadenopathy.  Assessment:   Problem List Items Addressed This Visit      Other   Malignant neoplasm of upper-outer quadrant of left breast in female, estrogen receptor positive ([place])    Other Visit Diagnoses    Breast pain, left    -  Primary       Plan: Contacted the office of her oncologist and spoke with Mateo Flow who will get her oncologist to order diagnostic mammogram and ultrasound with follow-up at their office.  Patient has been contacted.

## 2017-06-23 NOTE — Telephone Encounter (Signed)
Patient to follow up with NP in oncology per Hinda Lenis in oncology

## 2017-06-23 NOTE — Telephone Encounter (Signed)
Noted  

## 2017-06-23 NOTE — Telephone Encounter (Signed)
This RN spoke with NP - Pearson Forster ( primary care )  per visit with pt today with c/o left breast tenderness.  Per exam noted small nodule that was in same area as surgery, no noted swelling, increased warmth or nipple discharge.  Per above discussion- plan is to obtain a dedicated left breast mammo with [place].  This RN contacted pt and discussed the above.  Order entered including request for f/u with NP post scans.

## 2017-06-23 NOTE — Telephone Encounter (Signed)
[name] [name] from oncology contacted patient and ordered tests. It is not clear about follow up. I recommended follow up with their office. Her note looks like follow up with me. Please clarify. Thanks.

## 2017-07-02 ENCOUNTER — Telehealth: Payer: Self-pay | Admitting: *Deleted

## 2017-07-02 ENCOUNTER — Ambulatory Visit
Admission: RE | Admit: 2017-07-02 | Discharge: 2017-07-02 | Disposition: A | Discharge status: Home / Self Care | Payer: Medicare Other | Source: Ambulatory Visit | Attending: Oncology | Admitting: Oncology

## 2017-07-02 DIAGNOSIS — N6321 Unspecified lump in the left breast, upper outer quadrant: Secondary | ICD-10-CM | POA: Diagnosis not present

## 2017-07-02 DIAGNOSIS — N644 Mastodynia: Secondary | ICD-10-CM

## 2017-07-02 DIAGNOSIS — N63 Unspecified lump in unspecified breast: Secondary | ICD-10-CM

## 2017-07-02 DIAGNOSIS — Z170 Estrogen receptor positive status [ER+]: Secondary | ICD-10-CM

## 2017-07-02 DIAGNOSIS — C50412 Malignant neoplasm of upper-outer quadrant of left female breast: Secondary | ICD-10-CM

## 2017-07-02 DIAGNOSIS — R928 Other abnormal and inconclusive findings on diagnostic imaging of breast: Secondary | ICD-10-CM | POA: Diagnosis not present

## 2017-07-07 ENCOUNTER — Telehealth: Payer: Self-pay | Admitting: Family Medicine

## 2017-07-07 MED ORDER — ROSUVASTATIN CALCIUM 10 MG PO TABS: 10.0000 mg | ORAL_TABLET | Freq: Every day | ORAL | 0 refills | Status: DC

## 2017-07-07 MED ORDER — VALACYCLOVIR HCL 1 G PO TABS: 1000.0000 mg | ORAL_TABLET | Freq: Every day | ORAL | 0 refills | Status: DC

## 2017-07-07 MED ORDER — ALPRAZOLAM 1 MG PO TABS: ORAL_TABLET | ORAL | 0 refills | Status: DC

## 2017-07-07 NOTE — Telephone Encounter (Signed)
Patient is requesting a refill on her Cholestrol med(she is unsure of name), alprazolam, and valtrex.  She has an appointment coming up with Dr. Richardson Landry on 07/13/17.  [facility] [place]

## 2017-07-07 NOTE — Telephone Encounter (Signed)
Patient is aware 

## 2017-07-07 NOTE — Telephone Encounter (Signed)
Please advise 

## 2017-07-07 NOTE — Telephone Encounter (Signed)
Ok all times 30 days

## 2017-07-13 ENCOUNTER — Ambulatory Visit (INDEPENDENT_AMBULATORY_CARE_PROVIDER_SITE_OTHER): Payer: Medicare Other | Admitting: Family Medicine

## 2017-07-13 ENCOUNTER — Encounter: Payer: Self-pay | Admitting: Family Medicine

## 2017-07-13 VITALS — BP 134/86 | Ht 61.0 in | Wt 156.8 lb

## 2017-07-13 DIAGNOSIS — Z79899 Other long term (current) drug therapy: Secondary | ICD-10-CM | POA: Diagnosis not present

## 2017-07-13 DIAGNOSIS — E1169 Type 2 diabetes mellitus with other specified complication: Secondary | ICD-10-CM

## 2017-07-13 DIAGNOSIS — F329 Major depressive disorder, single episode, unspecified: Secondary | ICD-10-CM

## 2017-07-13 DIAGNOSIS — F32A Depression, unspecified: Secondary | ICD-10-CM

## 2017-07-13 DIAGNOSIS — E785 Hyperlipidemia, unspecified: Secondary | ICD-10-CM | POA: Diagnosis not present

## 2017-07-13 DIAGNOSIS — I10 Essential (primary) hypertension: Secondary | ICD-10-CM | POA: Diagnosis not present

## 2017-07-13 MED ORDER — ROSUVASTATIN CALCIUM 10 MG PO TABS: 10.0000 mg | ORAL_TABLET | Freq: Every day | ORAL | 5 refills | Status: DC

## 2017-07-13 MED ORDER — PANTOPRAZOLE SODIUM 40 MG PO TBEC: 40.0000 mg | DELAYED_RELEASE_TABLET | Freq: Two times a day (BID) | ORAL | 5 refills | Status: DC

## 2017-07-13 MED ORDER — ATENOLOL 50 MG PO TABS: 50.0000 mg | ORAL_TABLET | Freq: Two times a day (BID) | ORAL | 5 refills | Status: DC

## 2017-07-13 NOTE — Progress Notes (Signed)
   Subjective:    Patient ID: [name] [name], female    DOB: 1960-08-30, 57 y.o.   MRN: [id number]  Hypertension  This is a chronic problem. The current episode started more than 1 year ago. Risk factors for coronary artery disease include dyslipidemia, diabetes mellitus and post-menopausal state. Treatments tried: atenolol. There are no compliance problems.    Patient also reports pain in neck and shoulders.  Chronic pain in the neck and shoulders.  Tylenol helps some.  History of posterior headaches with this.  Prior headache specialist used "dry needling" which helped a little  Blood pressure medicine and blood pressure levels reviewed today with patient. Compliant with blood pressure medicine. States does not miss a dose. No obvious side effects. Blood pressure generally good when checked elsewhere. Watching salt intake.   Patient continues to take lipid medication regularly. No obvious side effects from it. Generally does not miss a dose. Prior blood work results are reviewed with patient. Patient continues to work on fat intake in diet  Patient notes ongoing compliance with antidepressant medication. No obvious side effects. Reports does not miss a dose. Overall continues to help depression substantially. No thoughts of homicide or suicide. Would like to maintain medication.  Headache dr dr [name] [name] left practice   See prior note with [name] [name] in midst of workup for recurrence breast mass along the surgical site for breast cancer.  First mammogram and ultrasound somewhat reassuring patient aware of this does not completely reassured and did not to see oncologist in follow-up  Review of Systems No headache, no major weight loss or weight gain, no chest pain no back pain abdominal pain no change in bowel habits complete ROS otherwise negative     Objective:   Physical Exam Alert and oriented, vitals reviewed and stable, NAD ENT-TM's and ext canals WNL bilat via otoscopic exam Soft  palate, tonsils and post pharynx WNL via oropharyngeal exam Neck-symmetric, no masses; thyroid nonpalpable and nontender Pulmonary-no tachypnea or accessory muscle use; Clear without wheezes via auscultation Card--no abnrml murmurs, rhythm reg and rate WNL Carotid pulses symmetric, without bruits        Assessment & Plan:  Impression 1 hypertension good control discussed to maintain same meds meds refilled  2.  Hyperlipidemia.  Prior blood work reviewed.  Needs more.  Maintain meds same dose and exercise discussed  3.  Breast nodule discussed importance of seeing through workup discussed  4.  Chronic neck shoulder head pain.  [place] patient was judged disabled.  Have advised patient will need to work to live with  #5 depression clinically stable

## 2017-07-14 LAB — HEPATIC FUNCTION PANEL
ALT: 11 IU/L (ref 0–32)
AST: 15 IU/L (ref 0–40)
Albumin: 4.5 g/dL (ref 3.5–5.5)
Alkaline Phosphatase: 49 IU/L (ref 39–117)
Bilirubin Total: 0.2 mg/dL (ref 0.0–1.2)
Bilirubin, Direct: 0.08 mg/dL (ref 0.00–0.40)
Total Protein: 7 g/dL (ref 6.0–8.5)

## 2017-07-14 LAB — LIPID PANEL
Chol/HDL Ratio: 2.7 ratio (ref 0.0–4.4)
Cholesterol, Total: 176 mg/dL (ref 100–199)
HDL: 65 mg/dL (ref >39)
LDL Calculated: 89 mg/dL (ref 0–99)
Triglycerides: 110 mg/dL (ref 0–149)
VLDL Cholesterol Cal: 22 mg/dL (ref 5–40)

## 2017-07-18 ENCOUNTER — Encounter: Payer: Self-pay | Admitting: Family Medicine

## 2017-07-21 ENCOUNTER — Telehealth: Payer: Self-pay | Admitting: Family Medicine

## 2017-07-21 MED ORDER — ESCITALOPRAM OXALATE 20 MG PO TABS: 20.0000 mg | ORAL_TABLET | Freq: Every day | ORAL | 4 refills | Status: DC

## 2017-07-21 NOTE — Telephone Encounter (Signed)
Patient says that our office denied her refill request for escitalopram that the pharmacy sent because she needed an OV.  She had an office visit on 07/13/17 with Dr. Richardson Landry, so she is requesting this medication to be called in.  [facility] [place]

## 2017-07-21 NOTE — Telephone Encounter (Signed)
Patient is aware sent in to [facility].

## 2017-07-21 NOTE — Telephone Encounter (Signed)
Please see note below. 

## 2017-07-21 NOTE — Telephone Encounter (Signed)
Patient may have Lexapro 20 mg, #30, 4 refills, 1 daily please send this in

## 2017-07-26 DIAGNOSIS — H5203 Hypermetropia, bilateral: Secondary | ICD-10-CM | POA: Diagnosis not present

## 2017-07-26 DIAGNOSIS — H2513 Age-related nuclear cataract, bilateral: Secondary | ICD-10-CM | POA: Diagnosis not present

## 2017-07-26 DIAGNOSIS — Z7984 Long term (current) use of oral hypoglycemic drugs: Secondary | ICD-10-CM | POA: Diagnosis not present

## 2017-07-26 DIAGNOSIS — E119 Type 2 diabetes mellitus without complications: Secondary | ICD-10-CM | POA: Diagnosis not present

## 2017-07-27 ENCOUNTER — Telehealth: Payer: Self-pay

## 2017-07-27 NOTE — Telephone Encounter (Signed)
VM received from pt regarding whether she needs to have a MRI since the last one was done in 2014 in regards to her left breast.  TC to pt, no answer, and LM with our contact info.

## 2017-07-27 NOTE — Telephone Encounter (Signed)
Clicked in error

## 2017-07-27 NOTE — Progress Notes (Unsigned)
Clicked in error

## 2017-07-27 NOTE — Telephone Encounter (Signed)
TC to pt to return her call.  She reports having a rash under her right breast and left breast has some swelling.  States her breast "just feel different" and she would feel better if she could have the MRI done since the last one was in 2014.  Told her Mendel Ryder NP will see her in office to assess these symptoms further and someone will call her back with an appt.  Pt voiced understanding. Will Ainsley Spinner NP to call pt with appt.

## 2017-07-27 NOTE — Telephone Encounter (Signed)
See previous note regarding will in-basket Mendel Ryder NP, actually I placed an in-basket  to Scheduling for them to  make an appt for pt with Mendel Ryder NP.

## 2017-07-27 NOTE — Telephone Encounter (Signed)
"  Returning call to [facility] in reference to MRI."  Call transferred to collaborative.

## 2017-07-28 ENCOUNTER — Telehealth: Payer: Self-pay | Admitting: Adult Health

## 2017-07-28 ENCOUNTER — Telehealth: Payer: Self-pay | Admitting: Oncology

## 2017-07-28 NOTE — Telephone Encounter (Signed)
Scheduled appts per 1/22 sch msg - spoke with patient and confirmed appts.

## 2017-07-28 NOTE — Telephone Encounter (Signed)
Rescheduled appts per 1/23 sch msg - spoke with patient regarding appts.

## 2017-07-30 ENCOUNTER — Telehealth: Payer: Self-pay | Admitting: Adult Health

## 2017-07-30 ENCOUNTER — Inpatient Hospital Stay: Payer: Medicare Other | Attending: Adult Health | Admitting: Adult Health

## 2017-07-30 ENCOUNTER — Encounter: Payer: Self-pay | Admitting: Adult Health

## 2017-07-30 VITALS — BP 113/71 | HR 63 | Temp 98.2°F | Resp 20 | Ht 61.0 in | Wt 156.1 lb

## 2017-07-30 DIAGNOSIS — M79622 Pain in left upper arm: Secondary | ICD-10-CM | POA: Diagnosis not present

## 2017-07-30 DIAGNOSIS — Z1231 Encounter for screening mammogram for malignant neoplasm of breast: Secondary | ICD-10-CM

## 2017-07-30 DIAGNOSIS — C50412 Malignant neoplasm of upper-outer quadrant of left female breast: Secondary | ICD-10-CM | POA: Diagnosis not present

## 2017-07-30 DIAGNOSIS — M8580 Other specified disorders of bone density and structure, unspecified site: Secondary | ICD-10-CM

## 2017-07-30 DIAGNOSIS — Z170 Estrogen receptor positive status [ER+]: Secondary | ICD-10-CM | POA: Diagnosis not present

## 2017-07-30 DIAGNOSIS — M25552 Pain in left hip: Secondary | ICD-10-CM | POA: Diagnosis not present

## 2017-07-30 NOTE — Progress Notes (Signed)
CLINIC:  Survivorship   REASON FOR VISIT:  Routine follow-up for history of breast cancer.   BRIEF ONCOLOGIC HISTORY:   [name] [name], [name] [name] woman   (1)  status post left breast upper outer quadrant biopsy 03/29/2013 for a clinical T2-T3 pN1, stage IIB-IIIA invasive ductal carcinoma, grade 2-3, estrogen receptor 39% positive (with moderate staining intensity), progesterone receptor negative, with an MIB-1 of 83%, and HER-2 amplified by CISH, with a ratio of 7.5 and 15 HER-2 copies per cell  (2) neoadjuvant chemotherapy, with trastuzumab/ pertuzumab/ carboplatin/ docetaxel repeated every 3 weeks x6, completed 08/21/2013. Pertuzumab  held after 4 cycles secondary to diarrhea. The patient received Neulasta on day 2 at [facility] per her request.   (3) Trastuzumab continued for total of one year (last dose 04/13/2014)). Most recent echo 06/04/2014 showed a well preserved ejection fraction.  (4)  status post left lumpectomy and axillary node dissection 09/12/2013, with a complete pathologic response (a total of 16 lymph nodes were examined,1 sentinel and 15 non-sentinel),  YpT0, ypN0.  (5) adjuvant radiation at [facility] completed 12/08/2013  (6) started anastrozole 01/16/2014             (a) bone density 04/09/2016 shows osteopenia with a T score of -1.7              (b) switched to tamoxifen as of OCT 2017 because of vaginal dryness issues                         (i) status post remote total abdominal hysterectomy with right salpingo-oophorectomy             (c) pelvic and transvaginal ultrasound found a normal left ovary, vaginal cuff appeared normal, with no area of suspicious attachment of the bowel to the cuff apex  (7)  genetics testing 06/22/2016 through the 21-gene Custom Cancer Panel through [facility], MD) found no deleterious mutations in ATM, BARD1, BRCA1, BRCA2, BRIP1, CDH1, CHEK2, EPCAM, FANCC, HOXB13, MLH1, MSH2, MSH6, NBN, PALB2, PMS2, PTEN,  RAD51C, RAD51D, TP53, and XRCC2.               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    INTERVAL HISTORY:  [name] [name] presents to the Survivorship Clinic today for routine follow-up for her history of breast cancer.  Overall, she reports feeling quite well.  She continues to take Tamoxifen daily and is managing the aches and pains from this medication well.  She underwent a left sided breast mammogram back in December for a palpable abnormality.  This was normal.  She still is concerned about a persistent left breast abnormality.  She feels like it is swollen, she has pain under the arm.  This si worsening over the past month.    [name] [name] is s/p TAH, she still has her right ovary.  She continues to see her PCP regularly.  She will be due for colon cancer screening in about a year.     REVIEW OF SYSTEMS:  Review of Systems  Constitutional: Negative for appetite change, chills, fatigue, fever and unexpected weight change.  HENT:   Negative for hearing loss and lump/mass.   Eyes: Negative for eye problems and icterus.  Respiratory: Negative for chest tightness, cough and shortness of breath.   Cardiovascular: Negative for chest pain and leg swelling.  Gastrointestinal: Negative for abdominal distention.  Endocrine: Negative for hot  flashes.  Musculoskeletal: Negative for arthralgias.  Skin: Negative for itching and rash.  Neurological: Negative for dizziness, extremity weakness, headaches and numbness.  Hematological: Negative for adenopathy. Does not bruise/bleed easily.  Psychiatric/Behavioral: The patient is not nervous/anxious.    Breast: Denies any new nodularity, masses, tenderness, nipple changes, or nipple discharge.       PAST MEDICAL/SURGICAL HISTORY:  Past Medical History:  Diagnosis Date  . Allergic rhinitis   . Anemia, unspecified 05/30/2013  . Anxiety   . Breast cancer ([place]) 03/31/13   left   . Cancer ([place])   . Chest pain 2009   Consultation-[place], negative chest CT; nl echo in 2005; h/o palpitations  . Colitis 2010   not IBD  . Degenerative joint disease    + degenerative joint disease of the lumbosacral spine  . Depression   . Diabetes ([place])   . Diabetes ([place])   . Dysrhythmia    palpations  . GERD (gastroesophageal reflux disease)    "a little"  . Headache   . Heart murmur    "small"  . Herpes simplex type II infection   . Hypercholesteremia    "slightly high"  . Hypertension    does not have high blood pressure  . 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 Palpitations   . Personal history of chemotherapy   . Personal history of radiation therapy   . Wears glasses    Past Surgical History:  Procedure Laterality Date  . ABDOMINAL HYSTERECTOMY  03/13/2007   TAH ?BSO--Dr Levin Bacon, [place]  . BREAST EXCISIONAL BIOPSY  04/2003   Left; benign disease  . BREAST LUMPECTOMY Left 09/2013  . BREAST LUMPECTOMY WITH NEEDLE LOCALIZATION AND AXILLARY LYMPH NODE DISSECTION Left 09/12/2013   Procedure: BREAST LUMPECTOMY WITH NEEDLE LOCALIZATION AND AXILLARY LYMPH NODE DISSECTION;  Surgeon: [name], MD;  Location: [place];  Service: General;  Laterality: Left;  and axilla  . BREAST SURGERY    . COLONOSCOPY  10/2010   proctitis; melanosis coli  . EYE SURGERY Left    "fix lazy eye"  . PORTACATH PLACEMENT Right 04/21/2013   Procedure: INSERTION PORT-A-CATH;  Surgeon: [name], MD;  Location: [facility];  Service: General;  Laterality: Right;  . RIGHT OOPHORECTOMY  03/13/2007  . SUBOCCIPITAL CRANIECTOMY CERVICAL LAMINECTOMY N/A 05/10/2014   Procedure: SUBOCCIPITAL CRANIECTOMY CERVICAL LAMINECTOMY/DURAPLASTY;  Surgeon: [name], MD;  Location: [place] [facility];  Service: Neurosurgery;  Laterality: N/A;  suboccipital craniectomy with cervical laminectomy and duraplasty  . TUBAL LIGATION       ALLERGIES:  Allergies  Allergen Reactions  . Doxycycline Itching  and Swelling     CURRENT MEDICATIONS:  Outpatient Encounter Medications as of 07/30/2017  Medication Sig  . acetaminophen (TYLENOL) 500 MG tablet Take 500 mg by mouth every 6 (six) hours as needed for moderate pain.   Marland Kitchen ALPRAZolam (XANAX) 1 MG tablet TAKE 1 TABLET BY MOUTH 3 TIMES A DAY AS NEEDED FOR ANXIETY  . atenolol (TENORMIN) 50 MG tablet Take 1 tablet (50 mg total) by mouth 2 (two) times daily.  Marland Kitchen BAYER CONTOUR NEXT TEST test strip TEST ONCE DAILY AS DIRECTED  . blood glucose meter kit and supplies KIT Dispense based on patient and insurance preference. Use as directed by physician. Diabetes type 2. E11.9  . diphenhydramine-acetaminophen (TYLENOL PM) 25-500 MG TABS Take 2 tablets by mouth at bedtime.  Marland Kitchen EPINEPHrine 0.3 mg/0.3 mL IJ SOAJ injection INJECT 0.3ML INTO MUSCLE ONCE A DAY  . escitalopram (  LEXAPRO) 20 MG tablet Take 1 tablet (20 mg total) by mouth daily.  Marland Kitchen FLECTOR 1.3 % PTCH APPLY 1 PATCH ON FOR 12 HOURS AND OFF FOR 12 HOURS  . hydrocortisone 2.5 % ointment 1 APPLICATION TO AFFECTED AREA UNDER BREAST TWICE A DAY EXTERNALLY FOR 14 DAYS  . linaclotide (LINZESS) 72 MCG capsule Take 1 capsule (72 mcg total) by mouth daily before breakfast. (Patient taking differently: Take 72 mcg by mouth as needed (pt takes as needed). )  . metFORMIN (GLUCOPHAGE-XR) 500 MG 24 hr tablet Take 1 tablet (500 mg total) every other day by mouth.  . pantoprazole (PROTONIX) 40 MG tablet Take 1 tablet (40 mg total) by mouth 2 (two) times daily.  . rosuvastatin (CRESTOR) 10 MG tablet Take 1 tablet (10 mg total) by mouth daily. For cholesterol  . tamoxifen (NOLVADEX) 10 MG tablet Take 1 tablet (10 mg total) 2 (two) times daily by mouth.  . valACYclovir (VALTREX) 1000 MG tablet Take 1 tablet (1,000 mg total) by mouth daily.  . [DISCONTINUED] potassium chloride SA (K-DUR,KLOR-CON) 20 MEQ tablet Take 1 tablet (20 mEq total) by mouth 2 (two) times daily.   No facility-administered encounter medications on file  as of 07/30/2017.      ONCOLOGIC FAMILY HISTORY:  Family History  Problem Relation Age of Onset  . Aneurysm Mother        Cerebral  . Hypertension Mother   . Hyperlipidemia Mother   . Stroke Mother   . Coronary artery disease Father   . Hypertension Father   . Hyperlipidemia Father   . Heart attack Father        d. 46  . Lung cancer Father        dx early 15s; former smoker  . Lung cancer Brother   . Lung cancer Brother        d. 26y; smoker  . Lung cancer Sister        paternal half-sister; not a smoker; dx <60; d. 45y  . Leukemia Maternal Aunt        "blood cancer"; d. unspecified age  . Cervical cancer Paternal Aunt        d. late 68s  . Breast cancer Sister 60  . Other Sister        hx of hysterectomy for unspecified reason  . Multiple myeloma Brother        d. 31s  . Lung cancer Brother        dx late 6s; smoker  . Prostate cancer Brother        dx late 72s  . Prostate cancer Brother        dx late 66s  . Lung cancer Brother   . Lung cancer Maternal Aunt        d. unspecified age  . Breast cancer Maternal Aunt        dx unspecified age  . Prostate cancer Cousin        maternal 1st cousin; dx older age  . Breast cancer Other        niece dx early 23s or before  . Leukemia Other        nephew d. 2y; "blood cancer"  . Colon cancer Neg Hx   . Diabetes Neg Hx     GENETIC COUNSELING/TESTING: neg  SOCIAL HISTORY:  [name] [name] is married and lives with her husband in [place], [place].  She has 4 children and they live in [place], [place], [place].  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  She denies any current or history of tobacco, alcohol, or illicit drug use.     PHYSICAL EXAMINATION:  Vital Signs: Vitals:   07/30/17 1343  BP: 113/71  Pulse: 63  Resp: 20  Temp: 98.2 F (36.8 C)  SpO2: 100%   Filed Weights   07/30/17 1343  Weight: 156 lb 1.6 oz (70.8 kg)   General:  Well-nourished, well-appearing female in no acute distress.  Unaccompanied today.   HEENT: Head is normocephalic.  Pupils equal and reactive to light. Conjunctivae clear without exudate.  Sclerae anicteric. Oral mucosa is pink, moist.  Oropharynx is pink without lesions or erythema.  Lymph: No cervical, supraclavicular, or infraclavicular lymphadenopathy noted on palpation.  Cardiovascular: Regular rate and rhythm.Marland Kitchen Respiratory: Clear to auscultation bilaterally. Chest expansion symmetric; breathing non-labored.  Breast Exam:  -Left breast: No appreciable masses on palpation. No skin redness, thickening, or peau d'orange appearance; no nipple retraction or nipple discharge; mild distortion in symmetry at previous lumpectomy site well healed scar without erythema or nodularity.  -Right breast: No appreciable masses on palpation. No skin redness, thickening, or peau d'orange appearance; no nipple retraction or nipple discharge;  -Axilla: No axillary adenopathy bilaterally. +left axillary tenderness GI: Abdomen soft and round; non-tender, non-distended. Bowel sounds normoactive. No hepatosplenomegaly.   GU: Deferred.  Neuro: No focal deficits. Steady gait.  Psych: Mood and affect normal and appropriate for situation.  MSK: No focal spinal tenderness to palpation, full range of motion in bilateral upper extremities Extremities: No edema. Skin: Warm and dry.  LABORATORY DATA:  None for this visit   DIAGNOSTIC IMAGING:  Most recent mammogram:     ASSESSMENT AND PLAN:  [name] [name] is a pleasant 57 y.o. female with history of Stage IIIA left breast invasive ductal carcinoma, ER+/PR-/HER2+, diagnosed in 03/2013, treated with neo-adjuvant chemotherapy, lumpectomy, maintenance Trastuzumab, and Anastrozole x 2 years followed by Tamoxifen which she is currently still taking without difficulty.  She presents to the Survivorship Clinic for surveillance and routine follow-up.   1. History of breast  cancer:  [name] [name] is concerned about continued difference in her left breast and pain/tenderness in her left axilla.  Due to this peristent issue with lack of findings on mammo and ultrasound I ordered her an MRI of her breasts.  She will see Dr. Jana Hakim in about 6 months for follow up.   I encouraged her to call me with any questions or concerns before her next visit at the cancer center, and I would be happy to see her sooner, if needed.    2. Bone health:  Given [name] [name]'s age, history of breast cancer, and her previous treatment with Anastrozole, she is at risk for bone demineralization. Her last DEXA scan was on 04/2016 and demonstrated osteopenia with a t score of -1.7 in the right femur.  I counseled her that Tamoxifen will have a protective effect on her bones.  She will need this repeated in October of this year.  In the meantime, she was encouraged to increase her consumption of foods rich in calcium, as well as increase her weight-bearing activities.  She was given education on specific food and activities to promote bone health.  3. Cancer screening:  Due to [name] [name]'s history and her age, she should receive screening for skin cancers, colon cancer. She was encouraged to follow-up with her PCP for appropriate cancer screenings.   4.  Health maintenance and wellness promotion: [name] [name] was encouraged to consume 5-7 servings of fruits and vegetables per day. She was also encouraged to engage in moderate to vigorous exercise for 30 minutes per day most days of the week. She was instructed to limit her alcohol consumption and continue to abstain from tobacco use.    Dispo:  -Return to cancer center in 6 months for follow up with Dr. Jana Hakim -Bone Density 04/2018 -MRI breast   A total of (30) minutes of face-to-face time was spent with this patient with greater than 50% of that time in counseling and care-coordination.   [name] Phlegm, NP Survivorship Program [facility] [phone]   Note: PRIMARY CARE PROVIDER Mikey Kirschner, [place] [phone]

## 2017-07-30 NOTE — Telephone Encounter (Signed)
Gave patient AVs and calendar of upcoming July appointments.  °

## 2017-08-02 ENCOUNTER — Ambulatory Visit: Payer: Medicare Other | Admitting: Adult Health

## 2017-08-05 ENCOUNTER — Ambulatory Visit (HOSPITAL_COMMUNITY)
Admission: RE | Admit: 2017-08-05 | Discharge: 2017-08-05 | Disposition: A | Discharge status: Home / Self Care | Payer: Medicare Other | Source: Ambulatory Visit | Attending: Adult Health | Admitting: Adult Health

## 2017-08-05 DIAGNOSIS — Z853 Personal history of malignant neoplasm of breast: Secondary | ICD-10-CM | POA: Diagnosis not present

## 2017-08-05 DIAGNOSIS — Z170 Estrogen receptor positive status [ER+]: Secondary | ICD-10-CM

## 2017-08-05 DIAGNOSIS — Z1231 Encounter for screening mammogram for malignant neoplasm of breast: Secondary | ICD-10-CM

## 2017-08-05 DIAGNOSIS — C50412 Malignant neoplasm of upper-outer quadrant of left female breast: Secondary | ICD-10-CM

## 2017-08-05 LAB — POCT I-STAT CREATININE: Creatinine, Ser: 0.8 mg/dL (ref 0.44–1.00)

## 2017-08-05 MED ORDER — GADOBENATE DIMEGLUMINE 529 MG/ML IV SOLN
15.0000 mL | Freq: Once | INTRAVENOUS | Status: AC | PRN
  Administered 2017-08-05: 14 mL via INTRAVENOUS

## 2017-08-06 ENCOUNTER — Telehealth: Payer: Self-pay

## 2017-08-06 ENCOUNTER — Other Ambulatory Visit: Payer: Self-pay | Admitting: Family Medicine

## 2017-08-06 NOTE — Telephone Encounter (Signed)
-----   Message from Gardenia Phlegm, NP sent at 08/06/2017  7:50 AM EST ----- Please let patient know that her MRI was normal ----- Message ----- From: Interface, Rad Results In Sent: 08/05/2017   3:33 PM To: Gardenia Phlegm, NP

## 2017-08-06 NOTE — Telephone Encounter (Signed)
Spoke with patient regarding normal MRI results.  Patient voiced understanding and had no questions or concerns at this time.  Knows to call center with any issues.

## 2017-08-11 ENCOUNTER — Telehealth: Payer: Self-pay | Admitting: *Deleted

## 2017-08-11 ENCOUNTER — Telehealth: Payer: Self-pay

## 2017-08-11 NOTE — Telephone Encounter (Signed)
Prescription from [facility]- requesting alternative for Crestor- suggested alternatives Lipitor 20mg  , Zocor 40mg , Pravachol 80mg  -  Patient has tried and failed pravachol in the past

## 2017-08-11 NOTE — Telephone Encounter (Signed)
lipitor 20 one qhs six ref

## 2017-08-12 MED ORDER — ATORVASTATIN CALCIUM 20 MG PO TABS: 20.0000 mg | ORAL_TABLET | Freq: Every day | ORAL | 5 refills | Status: DC

## 2017-08-12 NOTE — Telephone Encounter (Signed)
Prescription sent electronically to pharmacy (left message to return call to inform her to stop crestor-insurance no longer covers this med and we switched her to a preferred medication.)

## 2017-08-13 NOTE — Telephone Encounter (Signed)
Pt is aware.  

## 2017-08-16 ENCOUNTER — Ambulatory Visit: Payer: Medicare Other | Admitting: Cardiovascular Disease

## 2017-09-01 NOTE — Telephone Encounter (Signed)
No entry 

## 2017-09-14 ENCOUNTER — Telehealth: Payer: Self-pay | Admitting: Family Medicine

## 2017-09-14 MED ORDER — ALPRAZOLAM 1 MG PO TABS: ORAL_TABLET | ORAL | 2 refills | Status: DC

## 2017-09-14 MED ORDER — VALACYCLOVIR HCL 1 G PO TABS: 1000.0000 mg | ORAL_TABLET | Freq: Every day | ORAL | 11 refills | Status: DC

## 2017-09-14 NOTE — Telephone Encounter (Signed)
Await md sign

## 2017-09-14 NOTE — Addendum Note (Signed)
Addended by: Dairl Ponder on: 09/14/2017 04:23 PM   Modules accepted: Orders

## 2017-09-14 NOTE — Telephone Encounter (Signed)
Rx sent and pt is aware.  

## 2017-09-14 NOTE — Telephone Encounter (Signed)
Pt is requesting a refill on  valACYclovir (VALTREX) 1000 MG tablet  [facility]

## 2017-09-14 NOTE — Telephone Encounter (Signed)
Ok 11 ref 

## 2017-09-14 NOTE — Telephone Encounter (Signed)
Prescription sent electronically to pharmacy. Patient notified. 

## 2017-09-14 NOTE — Telephone Encounter (Signed)
Ok plus 2 monthly ref 

## 2017-09-14 NOTE — Telephone Encounter (Signed)
Patient also would like a refill of her Alprazolam

## 2017-09-23 ENCOUNTER — Encounter: Payer: Self-pay | Admitting: Family Medicine

## 2017-09-23 ENCOUNTER — Ambulatory Visit (INDEPENDENT_AMBULATORY_CARE_PROVIDER_SITE_OTHER): Payer: Medicare Other | Admitting: Family Medicine

## 2017-09-23 VITALS — BP 100/74 | Temp 101.7°F | Ht 61.0 in | Wt 151.0 lb

## 2017-09-23 DIAGNOSIS — J111 Influenza due to unidentified influenza virus with other respiratory manifestations: Secondary | ICD-10-CM

## 2017-09-23 MED ORDER — OSELTAMIVIR PHOSPHATE 75 MG PO CAPS: 75.0000 mg | ORAL_CAPSULE | Freq: Two times a day (BID) | ORAL | 0 refills | Status: AC

## 2017-09-23 MED ORDER — ONDANSETRON 4 MG PO TBDP: 4.0000 mg | ORAL_TABLET | Freq: Three times a day (TID) | ORAL | 0 refills | Status: DC | PRN

## 2017-09-23 NOTE — Progress Notes (Signed)
   Subjective:    Patient ID: [name] [name], female    DOB: 1960-08-29, 57 y.o.   MRN: [id number]  HPI Patient is here today with complaints of vomiting,diarrhea,non productive cough,runny nose,headache on going for the last three days. She has been taking tylenol robitussin and pepto which have not helped.  Really started to d ago, around sick g sonn   Pos whole headache   tmax 101  vom times ten   Also sig diarrhea  Has hx of cold  Then got bad cough this week     Bad aching bofdy    Had flu shot   Review of Systems No headache, no major weight loss or weight gain, no chest pain no back pain abdominal pain no change in bowel habits complete ROS otherwise negative     Objective:   Physical Exam Alert vitals reviewed, moderate malaise. Hydration good. Positive nasal congestion lungs no crackles or wheezes, no tachypnea, intermittent bronchial cough during exam heart regular rate and rhythm.        Assessment & Plan:  Impression influenza discussed at length. Petra Kuba of illness and potential sequela discussed. Plan Tamiflu prescribed if indicated and timing appropriate. Symptom care discussed. Warning signs discussed. WSL

## 2017-10-14 DIAGNOSIS — B351 Tinea unguium: Secondary | ICD-10-CM | POA: Diagnosis not present

## 2017-10-14 DIAGNOSIS — L609 Nail disorder, unspecified: Secondary | ICD-10-CM | POA: Diagnosis not present

## 2017-10-15 ENCOUNTER — Ambulatory Visit (INDEPENDENT_AMBULATORY_CARE_PROVIDER_SITE_OTHER): Payer: Medicare Other | Admitting: Adult Health

## 2017-10-15 ENCOUNTER — Encounter: Payer: Self-pay | Admitting: Adult Health

## 2017-10-15 VITALS — BP 130/72 | HR 62 | Ht 61.0 in | Wt 153.0 lb

## 2017-10-15 DIAGNOSIS — R1011 Right upper quadrant pain: Secondary | ICD-10-CM

## 2017-10-15 DIAGNOSIS — R1032 Left lower quadrant pain: Secondary | ICD-10-CM | POA: Diagnosis not present

## 2017-10-15 DIAGNOSIS — R51 Headache: Secondary | ICD-10-CM

## 2017-10-15 DIAGNOSIS — R110 Nausea: Secondary | ICD-10-CM | POA: Diagnosis not present

## 2017-10-15 DIAGNOSIS — R112 Nausea with vomiting, unspecified: Secondary | ICD-10-CM | POA: Diagnosis not present

## 2017-10-15 MED ORDER — PROMETHAZINE HCL 25 MG PO TABS: 25.0000 mg | ORAL_TABLET | Freq: Four times a day (QID) | ORAL | 1 refills | Status: DC | PRN

## 2017-10-15 NOTE — Progress Notes (Signed)
Subjective:     Patient ID: [name] [name], female   DOB: [birth year], 57 y.o.   MRN: [id number]  HPI [name] [name] is a 57 year old black female,married, in complaining of abdominal pain and some nausea and vomiting.  PCP is Mickie Hillier.  Review of Systems +abominal pain for last 2 weeks with some nausea and headache  Denies vomiting,diarreha or constipation Hx IBS Not currently sexually active  Reviewed past medical,surgical, social and family history. Reviewed medications and allergies.     Objective:   Physical Exam BP 130/72 (BP Location: Right Arm, Patient Position: Sitting, Cuff Size: Normal)   Pulse 62   Ht 5\' 1"  (1.549 m)   Wt 153 lb (69.4 kg)   LMP 12/04/2008   BMI 28.91 kg/m  Skin warm and dry. Lungs: clear to ausculation bilaterally. Cardiovascular: regular rate and rhythm.   Abdomen is soft and tender RUQ, no HSM noted. Pelvic: external genitalia is normal in appearance no lesions, vagina: white discharge without odor,urethra has no lesions or masses noted, cervix and uterus are absent, adnexa: no masses, LLQtenderness noted. Bladder is non tender and no masses felt.   Assessment:     1. RUQ pain   2. LLQ pain   3. Nausea       Plan:     Check CBC,CMP and lipase and amylase ABD [place] complete and pelvic US scheduled at [facility] 4/16 at 12:30 pm F/U with me in 6 days Meds ordered this encounter  Medications  . promethazine (PHENERGAN) 25 MG tablet    Sig: Take 1 tablet (25 mg total) by mouth every 6 (six) hours as needed for nausea or vomiting.    Dispense:  30 tablet    Refill:  1    Order Specific Question:   Supervising Provider    Answer:   Florian Buff [[id number]]  Ok to take tyelnol

## 2017-10-16 LAB — COMPREHENSIVE METABOLIC PANEL
ALT: 9 IU/L (ref 0–32)
AST: 13 IU/L (ref 0–40)
Albumin/Globulin Ratio: 1.6 (ref 1.2–2.2)
Albumin: 3.9 g/dL (ref 3.5–5.5)
Alkaline Phosphatase: 48 IU/L (ref 39–117)
BUN/Creatinine Ratio: 10 (ref 9–23)
BUN: 9 mg/dL (ref 6–24)
Bilirubin Total: 0.3 mg/dL (ref 0.0–1.2)
CO2: 24 mmol/L (ref 20–29)
Calcium: 8.9 mg/dL (ref 8.7–10.2)
Chloride: 103 mmol/L (ref 96–106)
Creatinine, Ser: 0.88 mg/dL (ref 0.57–1.00)
GFR calc Af Amer: 85 mL/min/{1.73_m2} (ref >59)
GFR calc non Af Amer: 74 mL/min/{1.73_m2} (ref >59)
Globulin, Total: 2.5 g/dL (ref 1.5–4.5)
Glucose: 87 mg/dL (ref 65–99)
Potassium: 3.9 mmol/L (ref 3.5–5.2)
Sodium: 144 mmol/L (ref 134–144)
Total Protein: 6.4 g/dL (ref 6.0–8.5)

## 2017-10-16 LAB — AMYLASE: Amylase: 54 U/L (ref 31–124)

## 2017-10-16 LAB — CBC
Hematocrit: 35.2 % (ref 34.0–46.6)
Hemoglobin: 12.1 g/dL (ref 11.1–15.9)
MCH: 28.3 pg (ref 26.6–33.0)
MCHC: 34.4 g/dL (ref 31.5–35.7)
MCV: 82 fL (ref 79–97)
Platelets: 184 10*3/uL (ref 150–379)
RBC: 4.27 x10E6/uL (ref 3.77–5.28)
RDW: 15.2 % (ref 12.3–15.4)
WBC: 7 10*3/uL (ref 3.4–10.8)

## 2017-10-16 LAB — LIPASE: Lipase: 16 U/L (ref 14–72)

## 2017-10-18 ENCOUNTER — Encounter: Payer: Self-pay | Admitting: Nurse Practitioner

## 2017-10-18 ENCOUNTER — Ambulatory Visit (INDEPENDENT_AMBULATORY_CARE_PROVIDER_SITE_OTHER): Payer: Medicare Other | Admitting: Nurse Practitioner

## 2017-10-18 VITALS — BP 120/84 | Ht 61.0 in | Wt 151.4 lb

## 2017-10-18 DIAGNOSIS — E119 Type 2 diabetes mellitus without complications: Secondary | ICD-10-CM | POA: Diagnosis not present

## 2017-10-18 DIAGNOSIS — I10 Essential (primary) hypertension: Secondary | ICD-10-CM | POA: Diagnosis not present

## 2017-10-18 DIAGNOSIS — Z23 Encounter for immunization: Secondary | ICD-10-CM

## 2017-10-19 ENCOUNTER — Other Ambulatory Visit: Payer: Self-pay | Admitting: *Deleted

## 2017-10-19 ENCOUNTER — Other Ambulatory Visit: Payer: Self-pay | Admitting: Adult Health

## 2017-10-19 ENCOUNTER — Encounter: Payer: Self-pay | Admitting: Nurse Practitioner

## 2017-10-19 ENCOUNTER — Ambulatory Visit (HOSPITAL_COMMUNITY)
Admission: RE | Admit: 2017-10-19 | Discharge: 2017-10-19 | Disposition: A | Discharge status: Home / Self Care | Payer: Medicare Other | Source: Ambulatory Visit | Attending: Adult Health | Admitting: Adult Health

## 2017-10-19 DIAGNOSIS — Z90721 Acquired absence of ovaries, unilateral: Secondary | ICD-10-CM | POA: Insufficient documentation

## 2017-10-19 DIAGNOSIS — R1032 Left lower quadrant pain: Secondary | ICD-10-CM

## 2017-10-19 DIAGNOSIS — R1011 Right upper quadrant pain: Secondary | ICD-10-CM

## 2017-10-19 DIAGNOSIS — R110 Nausea: Secondary | ICD-10-CM | POA: Insufficient documentation

## 2017-10-19 DIAGNOSIS — Z90710 Acquired absence of both cervix and uterus: Secondary | ICD-10-CM | POA: Insufficient documentation

## 2017-10-19 LAB — MICROALBUMIN, URINE: Microalbumin, Urine: 5.4 ug/mL

## 2017-10-19 MED ORDER — ESCITALOPRAM OXALATE 20 MG PO TABS: 20.0000 mg | ORAL_TABLET | Freq: Every day | ORAL | 0 refills | Status: DC

## 2017-10-19 MED ORDER — ESCITALOPRAM OXALATE 20 MG PO TABS: 20.0000 mg | ORAL_TABLET | Freq: Every day | ORAL | 4 refills | Status: DC

## 2017-10-19 NOTE — Progress Notes (Signed)
Subjective:  Presents with paperwork for DSS to be a foster parent. Gets regular physicals with GYN. Has had a colonoscopy in the past. Unsure about date or if her next one is due. States it was normal. Non smoker. Denies alcohol intake. Going to [facility] twice a week. Doing well emotionally. Now that her children are grown and her health is going well, she wants to become a foster parent.  Depression screen [facility] 2/9 10/18/2017  Decreased Interest 0  Down, Depressed, Hopeless 0  PHQ - 2 Score 0  Altered sleeping 0  Tired, decreased energy 1  Change in appetite 1  Feeling bad or failure about yourself  0  Trouble concentrating 0  Moving slowly or fidgety/restless 0  Suicidal thoughts 0  PHQ-9 Score 2  Some recent data might be hidden     Objective:   BP 120/84   Ht 5\' 1"  (1.549 m)   Wt 151 lb 6.4 oz (68.7 kg)   LMP 12/04/2008   BMI 28.61 kg/m  NAD. Alert, oriented. Cheerful affect. Thoughts logical, coherent and relevant. Dressed appropriately. Making good eye contact. Lungs clear. Heart RRR.   Assessment:   Problem List Items Addressed This Visit      Cardiovascular and Mediastinum   Essential hypertension     Endocrine   Diabetes ([place]) - Primary   Relevant Orders   Microalbumin, urine    Other Visit Diagnoses    Need for vaccination       Relevant Orders   Pneumococcal polysaccharide vaccine 23-valent greater than or equal to 2yo subcutaneous/IM (Completed)       Plan:  Urine microalbumin ratio ordered. Form completed today. Return in about 6 months (around 04/19/2018) for recheck.

## 2017-10-21 ENCOUNTER — Ambulatory Visit (INDEPENDENT_AMBULATORY_CARE_PROVIDER_SITE_OTHER): Payer: Medicare Other | Admitting: Adult Health

## 2017-10-21 ENCOUNTER — Encounter: Payer: Self-pay | Admitting: Adult Health

## 2017-10-21 VITALS — BP 120/80 | HR 91 | Ht 61.0 in | Wt 152.0 lb

## 2017-10-21 DIAGNOSIS — R1032 Left lower quadrant pain: Secondary | ICD-10-CM | POA: Diagnosis not present

## 2017-10-21 DIAGNOSIS — R1011 Right upper quadrant pain: Secondary | ICD-10-CM | POA: Diagnosis not present

## 2017-10-21 DIAGNOSIS — R110 Nausea: Secondary | ICD-10-CM | POA: Diagnosis not present

## 2017-10-21 MED ORDER — DICYCLOMINE HCL 10 MG PO CAPS: 10.0000 mg | ORAL_CAPSULE | Freq: Three times a day (TID) | ORAL | 1 refills | Status: DC

## 2017-10-21 NOTE — Progress Notes (Signed)
Subjective:     Patient ID: [name] [name], female   DOB: 10-Jan-1961, 57 y.o.   MRN: [id number]  HPI [name] [name] is a 57 year old black female back in follow up of having labs and US,felt better with phenergan.   Review of Systems +discomfort LLQ and RUQ +nasuea at times Hx constipation Reviewed past medical,surgical, social and family history. Reviewed medications and allergies.     Objective:   Physical Exam BP 120/80 (BP Location: Right Arm, Patient Position: Sitting, Cuff Size: Small)   Pulse 91   Ht 5\' 1"  (1.549 m)   Wt 152 lb (68.9 kg)   LMP 12/04/2008   BMI 28.72 kg/m   Talk only:reviewed labs which were normal and [place], had Gas on abdominal US,and left ovary difficult to separate from bowel, no masses.Will try bentyl and try to eat more often,not once a day.     Assessment:     . 1. Nausea   2. LLQ pain   3. RUQ pain       Plan:     Meds ordered this encounter  Medications  . dicyclomine (BENTYL) 10 MG capsule    Sig: Take 1 capsule (10 mg total) by mouth 4 (four) times daily -  before meals and at bedtime.    Dispense:  120 capsule    Refill:  1    Order Specific Question:   Supervising Provider    Answer:   Elonda Husky, LUTHER H [[id number]]  Eat 3 small meals, don't skip Increase water F/U in 5 weeks See GI in F/U

## 2017-10-26 ENCOUNTER — Telehealth: Payer: Self-pay | Admitting: *Deleted

## 2017-10-26 ENCOUNTER — Ambulatory Visit (INDEPENDENT_AMBULATORY_CARE_PROVIDER_SITE_OTHER): Payer: Medicare Other | Admitting: Gastroenterology

## 2017-10-26 ENCOUNTER — Encounter: Payer: Self-pay | Admitting: Gastroenterology

## 2017-10-26 ENCOUNTER — Encounter: Payer: Self-pay | Admitting: *Deleted

## 2017-10-26 ENCOUNTER — Other Ambulatory Visit: Payer: Self-pay | Admitting: *Deleted

## 2017-10-26 VITALS — BP 113/73 | HR 54 | Temp 97.8°F | Wt 152.8 lb

## 2017-10-26 DIAGNOSIS — K581 Irritable bowel syndrome with constipation: Secondary | ICD-10-CM | POA: Diagnosis not present

## 2017-10-26 DIAGNOSIS — K625 Hemorrhage of anus and rectum: Secondary | ICD-10-CM

## 2017-10-26 DIAGNOSIS — R110 Nausea: Secondary | ICD-10-CM

## 2017-10-26 MED ORDER — NA SULFATE-K SULFATE-MG SULF 17.5-3.13-1.6 GM/177ML PO SOLN: 1.0000 | ORAL | 0 refills | Status: DC

## 2017-10-26 MED ORDER — PLECANATIDE 3 MG PO TABS: 3.0000 mg | ORAL_TABLET | Freq: Every day | ORAL | 0 refills | Status: DC

## 2017-10-26 NOTE — Assessment & Plan Note (Signed)
Typical reflux well controlled. Not postpranidal. Sporadic symptoms. Etiology not clear. Work up unrevealing so far. Continue PPI. Trulance as planned. PR in two weeks. If symptoms don't settle down, she will need further work up.

## 2017-10-26 NOTE — Telephone Encounter (Signed)
Pre-op scheduled for 12/29/17 at 10:00am. Patient aware. Letter mailed.

## 2017-10-26 NOTE — Assessment & Plan Note (Addendum)
Three weeks of lower abd pain, evaluated by gyn and has had pelvic and abd u/s. Left ovary may be fixed to bowel but difficult to tell on u/s. Labs look great. Bentyl helping abd pain somewhat. She denies improvement with abd pain after BM. Somewhat atypical for IBS. She has difficulty passing stool but usually stools are soft. Likely in part due to pelvic floor dysfunction.   Try trulance 3mg  daily. Samples provided. Hold if too much stool/diarrhea. Call with progress report in couple of weeks. Also advised to watch for constipation on bentyl.

## 2017-10-26 NOTE — Progress Notes (Addendum)
REVIEWED-NO ADDITIONAL RECOMMENDATIONS.  Primary Care Physician: Mikey Kirschner, MD  Primary Gastroenterologist:  Barney Drain, MD   Chief Complaint  Patient presents with  . Abdominal Pain  . Nausea  . Rectal Bleeding    HPI: [name] [name] is a 57 y.o. female here for follow-up. She has a history of GERD and IBS. Last seen in October. Amitiza previously unpredictable for her. 6mg bid not strong enough but 252m bid too strong. 2431monce daily too strong. At last office visit she was advised to increase dietary fiber, use fiber supplements twice daily,Linzess 53m63mpening capsule into 4 teaspoons water, takes 3 teaspoons of water daily. That was too strong as well.  Recent abdominal ultrasound was unremarkable gallbladder. Pelvic ultrasound unremarkable as well except for difficult to separate left ovary from bowel.   Three weeks of lower abdominal pain, intermittent, no aggravating or alleviating factors. Nausea without vomiting. Bentyl started by gyn, helping the pain some. Before all this started had a "bad case of constipation". Stools are usually not hard. Struggles to get stool to come out. Some brbpr recently. Metamucil caused bloating.On metformin for couple of years.   Nausea wakes up out of sleep. Appetite not too good. Diabetes "backwards". Does better if doesn't eat anything. Sees an endocrinologist. Hgb A1c is in 5 range. Metformin 500mg15mry other day now.   Phenergan once daily for nausea.   Current Outpatient Medications  Medication Sig Dispense Refill  . acetaminophen (TYLENOL) 500 MG tablet Take 500 mg by mouth every 6 (six) hours as needed for moderate pain.     . ALPMarland KitchenAZolam (XANAX) 1 MG tablet TAKE 1 TABLET BY MOUTH 3 TIMES A DAY AS NEEDED FOR ANXIETY 60 tablet 2  . atenolol (TENORMIN) 50 MG tablet Take 1 tablet (50 mg total) by mouth 2 (two) times daily. 60 tablet 5  . atorvastatin (LIPITOR) 20 MG tablet Take 1 tablet (20 mg total) by mouth daily at  6 PM. 30 tablet 5  . BAYER CONTOUR NEXT TEST test strip TEST ONCE DAILY AS DIRECTED 50 each 2  . blood glucose meter kit and supplies KIT Dispense based on patient and insurance preference. Use as directed by physician. Diabetes type 2. E11.9 1 each 0  . dicyclomine (BENTYL) 10 MG capsule Take 1 capsule (10 mg total) by mouth 4 (four) times daily -  before meals and at bedtime. 120 capsule 1  . diphenhydramine-acetaminophen (TYLENOL PM) 25-500 MG TABS Take 2 tablets by mouth at bedtime.    . EPIMarland KitchenEPHrine 0.3 mg/0.3 mL IJ SOAJ injection INJECT 0.3ML INTO MUSCLE ONCE A DAY  1  . escitalopram (LEXAPRO) 20 MG tablet Take 1 tablet (20 mg total) by mouth daily. 90 tablet 0  . metFORMIN (GLUCOPHAGE-XR) 500 MG 24 hr tablet Take 1 tablet (500 mg total) every other day by mouth. 30 tablet 5  . pantoprazole (PROTONIX) 40 MG tablet Take 1 tablet (40 mg total) by mouth 2 (two) times daily. 60 tablet 5  . promethazine (PHENERGAN) 25 MG tablet Take 1 tablet (25 mg total) by mouth every 6 (six) hours as needed for nausea or vomiting. 30 tablet 1  . tamoxifen (NOLVADEX) 10 MG tablet Take 1 tablet (10 mg total) 2 (two) times daily by mouth. (Patient taking differently: Take 10 mg by mouth daily. ) 90 tablet 4  . valACYclovir (VALTREX) 1000 MG tablet Take 1 tablet (1,000 mg total) by mouth daily. 30 tablet 11   No current facility-administered  medications for this visit.     Allergies as of 10/26/2017 - Review Complete 10/26/2017  Allergen Reaction Noted  . Doxycycline Itching and Swelling 06/27/2012   Past Medical History:  Diagnosis Date  . Allergic rhinitis   . Anemia, unspecified 05/30/2013  . Anxiety   . Breast cancer ([place]) 03/31/13   left  . Cancer ([place])   . Chest pain 2009   Consultation-Rothbart, negative chest CT; nl echo in 2005; h/o palpitations  . Colitis 2010   not IBD  . Degenerative joint disease    + degenerative joint disease of the lumbosacral spine  . Depression   . Diabetes ([place])     . Diabetes ([place])   . Dysrhythmia    palpations  . GERD (gastroesophageal reflux disease)    "a little"  . Headache   . Heart murmur    "small"  . Herpes simplex type II infection   . Hypercholesteremia    "slightly high"  . Hypertension    does not have high blood pressure  . 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 Palpitations   . Personal history of chemotherapy   . Personal history of radiation therapy   . Wears glasses    Past Surgical History:  Procedure Laterality Date  . ABDOMINAL HYSTERECTOMY  03/13/2007   TAH ?BSO--Dr Levin Bacon, [place]  . BREAST EXCISIONAL BIOPSY  04/2003   Left; benign disease  . BREAST LUMPECTOMY Left 09/2013  . BREAST LUMPECTOMY WITH NEEDLE LOCALIZATION AND AXILLARY LYMPH NODE DISSECTION Left 09/12/2013   Procedure: BREAST LUMPECTOMY WITH NEEDLE LOCALIZATION AND AXILLARY LYMPH NODE DISSECTION;  Surgeon: [name], MD;  Location: [facility];  Service: General;  Laterality: Left;  and axilla  . BREAST SURGERY    . COLONOSCOPY  10/2010   proctitis; melanosis coli  . EYE SURGERY Left    "fix lazy eye"  . PORTACATH PLACEMENT Right 04/21/2013   Procedure: INSERTION PORT-A-CATH;  Surgeon: [name], MD;  [facility];  Service: General;  Laterality: Right;  . RIGHT OOPHORECTOMY  03/13/2007  . SUBOCCIPITAL CRANIECTOMY CERVICAL LAMINECTOMY N/A 05/10/2014   Procedure: SUBOCCIPITAL CRANIECTOMY CERVICAL LAMINECTOMY/DURAPLASTY;  Surgeon: [name], MD;  Location: [facility];  Service: Neurosurgery;  Laterality: N/A;  suboccipital craniectomy with cervical laminectomy and duraplasty  . TUBAL LIGATION     Family History  Problem Relation Age of Onset  . Aneurysm Mother        Cerebral  . Hypertension Mother   . Hyperlipidemia Mother   . Stroke Mother   . Coronary artery disease Father   . Hypertension Father   . Hyperlipidemia Father   . Heart attack Father        d. 66  . Lung cancer Father        dx early 82s; former smoker  .  Lung cancer Brother   . Lung cancer Brother        d. 72y; smoker  . Lung cancer Sister        paternal half-sister; not a smoker; dx <60; d. 72y  . Leukemia Maternal Aunt        "blood cancer"; d. unspecified age  . Cervical cancer Paternal Aunt        d. late 3s  . Breast cancer Sister 3  . Other Sister        hx of hysterectomy for unspecified reason  . Multiple myeloma Brother        d. 71s  .  Lung cancer Brother        dx late 63s; smoker  . Prostate cancer Brother        dx late 90s  . Prostate cancer Brother        dx late 47s  . Lung cancer Brother   . Lung cancer Maternal Aunt        d. unspecified age  . Breast cancer Maternal Aunt        dx unspecified age  . Prostate cancer Cousin        maternal 1st cousin; dx older age  . Breast cancer Other        niece dx early 35s or before  . Leukemia Other        nephew d. 2y; "blood cancer"  . Colon cancer Neg Hx   . Diabetes Neg Hx    Social History   Tobacco Use  . Smoking status: Former Smoker    Packs/day: 0.50    Years: 15.00    Pack years: 7.50    Types: Cigarettes    Last attempt to quit: 07/06/1982    Years since quitting: 35.3  . Smokeless tobacco: Never Used  Substance Use Topics  . Alcohol use: No  . Drug use: No     ROS:  General: Negative for anorexia, significant weight loss, fever, chills, fatigue, weakness. ENT: Negative for hoarseness, difficulty swallowing , nasal congestion. CV: Negative for chest pain, angina, palpitations, dyspnea on exertion, peripheral edema.  Respiratory: Negative for dyspnea at rest, dyspnea on exertion, cough, sputum, wheezing.  GI: See history of present illness. GU:  Negative for dysuria, hematuria, urinary incontinence, urinary frequency, nocturnal urination.  Endo: Negative for unusual weight change.    Physical Examination:   BP 113/73   Pulse (!) 54   Temp 97.8 F (36.6 C) (Oral)   Wt 152 lb 12.8 oz (69.3 kg)   LMP 12/04/2008   BMI 28.87 kg/m    General: Well-nourished, well-developed in no acute distress.  Eyes: No icterus. Mouth: Oropharyngeal mucosa moist and pink , no lesions erythema or exudate. Lungs: Clear to auscultation bilaterally.  Heart: Regular rate and rhythm, no murmurs rubs or gallops.  Abdomen: Bowel sounds are normal, she had mild diffuse tenderness to light palpation, nondistended, no hepatosplenomegaly or masses, no abdominal bruits or hernia , no rebound or guarding.   Extremities: No lower extremity edema. No clubbing or deformities. Neuro: Alert and oriented x 4   Skin: Warm and dry, no jaundice.   Psych: Alert and cooperative, normal mood and affect.  Labs:  Lab Results  Component Value Date   AMYLASE 54 10/15/2017   Lab Results  Component Value Date   LIPASE 16 10/15/2017   Lab Results  Component Value Date   CREATININE 0.88 10/15/2017   BUN 9 10/15/2017   NA 144 10/15/2017   K 3.9 10/15/2017   CL 103 10/15/2017   CO2 24 10/15/2017   Lab Results  Component Value Date   ALT 9 10/15/2017   AST 13 10/15/2017   ALKPHOS 48 10/15/2017   BILITOT 0.3 10/15/2017   Lab Results  Component Value Date   WBC 7.0 10/15/2017   HGB 12.1 10/15/2017   HCT 35.2 10/15/2017   MCV 82 10/15/2017   PLT 184 10/15/2017     Imaging Studies: US Abdomen Complete  Result Date: 10/19/2017 CLINICAL DATA:  57 year old female with right upper quadrant pain, nausea and vomiting for 2 weeks. Breast cancer post lumpectomy. Uterine  cancer post hysterectomy. Hypertension. Initial encounter. EXAM: ABDOMEN ULTRASOUND COMPLETE COMPARISON:  04/15/2016 CT. FINDINGS: Gallbladder: No gallstones or wall thickening visualized. No sonographic Murphy sign noted by sonographer. Common bile duct: Diameter: 2.3 mm Liver: No focal lesion identified. Within normal limits in parenchymal echogenicity. Portal vein is patent on color Doppler imaging with normal direction of blood flow towards the liver. IVC: No abnormality visualized.  Pancreas: Not well visualized secondary to bowel gas. Spleen: Size and appearance within normal limits. Right Kidney: Length: 9.8 cm. Echogenicity within normal limits. No mass or hydronephrosis visualized. Left Kidney: Length: 8.9 cm. Echogenicity within normal limits. No mass or hydronephrosis visualized. Abdominal aorta: Atherosclerotic plaque.  No aneurysm visualized. Other findings: None. IMPRESSION: Gallbladder unremarkable. No hepatic lesion identified. Pancreas and portions of abdominal aorta are poorly delineated secondary to bowel gas. Electronically Signed   By: Genia Del M.D.   On: 10/19/2017 15:16   US Pelvis Transvanginal Non-ob (tv Only)  Result Date: 10/19/2017 CLINICAL DATA:  57 year old female with left lower quadrant pain with nausea for the past 2 weeks. Prior hysterectomy and oophorectomy. Uterine cancer. Initial encounter. EXAM: TRANSABDOMINAL AND TRANSVAGINAL ULTRASOUND OF PELVIS TECHNIQUE: Both transabdominal and transvaginal ultrasound examinations of the pelvis were performed. Transabdominal technique was performed for global imaging of the pelvis including uterus, ovaries, adnexal regions, and pelvic cul-de-sac. It was necessary to proceed with endovaginal exam following the transabdominal exam to visualize the left ovary. COMPARISON:  04/15/2016 CT.  Outside pelvic sonogram 12/02/2016. FINDINGS: Uterus Post hysterectomy. Right ovary Post right oophorectomy. Left ovary Measurements: 2.4 x 1.9 x 2.6 cm. Normal appearance/no adnexal mass. Other findings Trace free fluid. IMPRESSION: Post hysterectomy and right oophorectomy. Left ovary was difficult to separate from bowel however, no left ovarian mass identified. Trace free fluid. Electronically Signed   By: Genia Del M.D.   On: 10/19/2017 15:10   US Pelvis (transabdominal Only)  Result Date: 10/19/2017 CLINICAL DATA:  57 year old female with left lower quadrant pain with nausea for the past 2 weeks. Prior hysterectomy and  oophorectomy. Uterine cancer. Initial encounter. EXAM: TRANSABDOMINAL AND TRANSVAGINAL ULTRASOUND OF PELVIS TECHNIQUE: Both transabdominal and transvaginal ultrasound examinations of the pelvis were performed. Transabdominal technique was performed for global imaging of the pelvis including uterus, ovaries, adnexal regions, and pelvic cul-de-sac. It was necessary to proceed with endovaginal exam following the transabdominal exam to visualize the left ovary. COMPARISON:  04/15/2016 CT.  Outside pelvic sonogram 12/02/2016. FINDINGS: Uterus Post hysterectomy. Right ovary Post right oophorectomy. Left ovary Measurements: 2.4 x 1.9 x 2.6 cm. Normal appearance/no adnexal mass. Other findings Trace free fluid. IMPRESSION: Post hysterectomy and right oophorectomy. Left ovary was difficult to separate from bowel however, no left ovarian mass identified. Trace free fluid. Electronically Signed   By: Genia Del M.D.   On: 10/19/2017 15:10

## 2017-10-26 NOTE — Patient Instructions (Signed)
1. Try Trulance once daily. Samples provided. If you have significant diarrhea/loose stools, you can hold medication for a day and then resume.  2. Call with progress report in one to two weeks. I want to know how medication helped abdominal pain, nausea, bowel function.  3. Colonoscopy as scheduled. See separate instructions.

## 2017-10-26 NOTE — Assessment & Plan Note (Signed)
Recent rectal bleeding, small volume. Last TCS 7 years ago. Recommend colonoscopy in near future, deep sedation.  I have discussed the risks, alternatives, benefits with regards to but not limited to the risk of reaction to medication, bleeding, infection, perforation and the patient is agreeable to proceed. Written consent to be obtained.

## 2017-10-27 NOTE — Progress Notes (Signed)
cc'ed to pcp °

## 2017-11-15 ENCOUNTER — Encounter (HOSPITAL_COMMUNITY): Payer: Self-pay | Admitting: Emergency Medicine

## 2017-11-15 ENCOUNTER — Emergency Department (HOSPITAL_COMMUNITY)
Admission: EM | Admit: 2017-11-15 | Discharge: 2017-11-15 | Disposition: A | Discharge status: Home / Self Care | Payer: Medicare Other | Attending: Emergency Medicine | Admitting: Emergency Medicine

## 2017-11-15 DIAGNOSIS — R51 Headache: Secondary | ICD-10-CM | POA: Insufficient documentation

## 2017-11-15 DIAGNOSIS — Z7984 Long term (current) use of oral hypoglycemic drugs: Secondary | ICD-10-CM | POA: Diagnosis not present

## 2017-11-15 DIAGNOSIS — E119 Type 2 diabetes mellitus without complications: Secondary | ICD-10-CM | POA: Insufficient documentation

## 2017-11-15 DIAGNOSIS — Z853 Personal history of malignant neoplasm of breast: Secondary | ICD-10-CM | POA: Diagnosis not present

## 2017-11-15 DIAGNOSIS — Z87891 Personal history of nicotine dependence: Secondary | ICD-10-CM | POA: Diagnosis not present

## 2017-11-15 DIAGNOSIS — Z79899 Other long term (current) drug therapy: Secondary | ICD-10-CM | POA: Insufficient documentation

## 2017-11-15 DIAGNOSIS — I10 Essential (primary) hypertension: Secondary | ICD-10-CM | POA: Insufficient documentation

## 2017-11-15 DIAGNOSIS — M25511 Pain in right shoulder: Secondary | ICD-10-CM

## 2017-11-15 MED ORDER — CYCLOBENZAPRINE HCL 10 MG PO TABS: 10.0000 mg | ORAL_TABLET | Freq: Two times a day (BID) | ORAL | 0 refills | Status: DC | PRN

## 2017-11-15 MED ORDER — NAPROXEN 375 MG PO TABS: 375.0000 mg | ORAL_TABLET | Freq: Two times a day (BID) | ORAL | 0 refills | Status: DC

## 2017-11-15 MED ORDER — IBUPROFEN 400 MG PO TABS
600.0000 mg | ORAL_TABLET | Freq: Once | ORAL | Status: AC
  Administered 2017-11-15: 600 mg via ORAL
  Filled 2017-11-15: qty 1

## 2017-11-15 NOTE — ED Provider Notes (Signed)
[facility] EMERGENCY DEPARTMENT Provider Note   CSN: [id number] Arrival date & time: 11/15/17  1418     History   Chief Complaint No chief complaint on file.   HPI [name] [name] is a 57 y.o. female.  Patient reports awakening this morning, feeling good. A short time later, she developed tightness in the muscles overlying the scapula, originating at the back of the head.  No recent injury.  No fevers, chills, nuchal rigidity. Patient with history of chiari malformation. Mild headache. Denies dizziness, difficulty swallowing, vision problems, unsteady gait.   Shoulder Pain   This is a new problem. The current episode started 6 to 12 hours ago. The problem has not changed since onset.The pain is present in the right shoulder. The quality of the pain is described as sharp. The pain is moderate. Pertinent negatives include no numbness, full range of motion and no tingling. There has been no history of extremity trauma.    Past Medical History:  Diagnosis Date  . Allergic rhinitis   . Anemia, unspecified 05/30/2013  . Anxiety   . Breast cancer ([place]) 03/31/13   left  . Cancer ([employer])   . Chest pain 2009   Consultation-Rothbart, negative chest CT; nl echo in 2005; h/o palpitations  . Colitis 2010   not IBD  . Degenerative joint disease    + degenerative joint disease of the lumbosacral spine  . Depression   . Diabetes ([place])   . Diabetes ([place])   . Dysrhythmia    palpations  . GERD (gastroesophageal reflux disease)    "a little"  . Headache   . Heart murmur    "small"  . Herpes simplex type II infection   . Hypercholesteremia    "slightly high"  . Hypertension    does not have high blood pressure  . 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 Palpitations   . Personal history of chemotherapy   . Personal history of radiation therapy   . Wears glasses     Patient Active Problem List   Diagnosis Date Noted  . Rectal bleeding 10/26/2017  . RUQ pain 10/21/2017  . LLQ  pain 10/21/2017  . Nausea without vomiting 10/21/2017  . Paresthesias 05/14/2017  . Hyperlipidemia associated with type 2 diabetes mellitus ([place]) 03/22/2017  . Genetic testing 07/01/2016  . Lipoma of back 06/03/2016  . Reactive hypoglycemia 06/03/2016  . Family history of breast cancer in female 05/22/2016  . Diabetes ([place])   . IBS (irritable bowel syndrome) 12/10/2015  . Depression 08/23/2014  . Anxiety 08/23/2014  . Heart murmur 08/23/2014  . Hypercholesteremia 08/23/2014  . History of Chiari malformation 08/23/2014  . 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 Fever   . Vaginal dryness 07/16/2014  . 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 Joint pain 10/02/2013  . Neuropathic pain 10/02/2013  . Onychomycosis of toenail 10/02/2013  . Frequent headaches 06/19/2013  . Anxiety as acute reaction to exceptional stress 05/22/2013  . Malignant neoplasm of upper-outer quadrant of left breast in female, estrogen receptor positive ([place]) 04/06/2013  . 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 Obesity (BMI 30-39.9) 12/14/2012  . GERD (gastroesophageal reflux disease) 12/14/2012  . Chronic depression 12/14/2012  . Heart murmur, systolic 43/15/4008  . Essential hypertension 09/02/2012  . Chest pain   . Palpitations   . DEGENERATIVE JOINT DISEASE, RIGHT KNEE 10/03/2009  . [place] DEGENERATION 10/24/2007    Past Surgical History:  Procedure Laterality Date  . ABDOMINAL HYSTERECTOMY  03/13/2007   TAH ?BSO--Dr Levin Bacon,  [place]  . BREAST EXCISIONAL BIOPSY  04/2003   Left; benign disease  . BREAST LUMPECTOMY Left 09/2013  . BREAST LUMPECTOMY WITH NEEDLE LOCALIZATION AND AXILLARY LYMPH NODE DISSECTION Left 09/12/2013   Procedure: BREAST LUMPECTOMY WITH NEEDLE LOCALIZATION AND AXILLARY LYMPH NODE DISSECTION;  Surgeon: [name], MD;  Location: [facility];  Service: General;  Laterality: Left;  and axilla  . BREAST SURGERY    . COLONOSCOPY  10/2010   proctitis; melanosis coli  . EYE  SURGERY Left    "fix lazy eye"  . PORTACATH PLACEMENT Right 04/21/2013   Procedure: INSERTION PORT-A-CATH;  Surgeon: [name], MD;  [facility];  Service: General;  Laterality: Right;  . RIGHT OOPHORECTOMY  03/13/2007  . SUBOCCIPITAL CRANIECTOMY CERVICAL LAMINECTOMY N/A 05/10/2014   Procedure: SUBOCCIPITAL CRANIECTOMY CERVICAL LAMINECTOMY/DURAPLASTY;  Surgeon: [name], MD;  Location: [facility];  Service: Neurosurgery;  Laterality: N/A;  suboccipital craniectomy with cervical laminectomy and duraplasty  . TUBAL LIGATION       OB History    Gravida  4   Para  4   Term  4   Preterm      AB      Living  4     SAB      TAB      Ectopic      Multiple      Live Births  4            Home Medications    Prior to Admission medications   Medication Sig Start Date End Date Taking? Authorizing Provider  acetaminophen (TYLENOL) 500 MG tablet Take 500 mg by mouth every 6 (six) hours as needed for moderate pain.     [provider]  ALPRAZolam Duanne Moron) 1 MG tablet TAKE 1 TABLET BY MOUTH 3 TIMES A DAY AS NEEDED FOR ANXIETY 09/14/17   Mikey Kirschner, MD  atenolol (TENORMIN) 50 MG tablet Take 1 tablet (50 mg total) by mouth 2 (two) times daily. 07/13/17   Mikey Kirschner, MD  atorvastatin (LIPITOR) 20 MG tablet Take 1 tablet (20 mg total) by mouth daily at 6 PM. 08/12/17   Mikey Kirschner, MD  BAYER CONTOUR NEXT TEST test strip TEST ONCE DAILY AS DIRECTED 07/17/16   Mikey Kirschner, MD  blood glucose meter kit and supplies KIT Dispense based on patient and insurance preference. Use as directed by physician. Diabetes type 2. E11.9 03/03/16   Mikey Kirschner, MD  dicyclomine (BENTYL) 10 MG capsule Take 1 capsule (10 mg total) by mouth 4 (four) times daily -  before meals and at bedtime. 10/21/17   Estill Dooms, NP  diphenhydramine-acetaminophen (TYLENOL PM) 25-500 MG TABS Take 2 tablets by mouth at bedtime.    [provider]  EPINEPHrine  0.3 mg/0.3 mL IJ SOAJ injection INJECT 0.3ML INTO MUSCLE ONCE A DAY 04/07/17   [provider]  escitalopram (LEXAPRO) 20 MG tablet Take 1 tablet (20 mg total) by mouth daily. 10/19/17   Mikey Kirschner, MD  metFORMIN (GLUCOPHAGE-XR) 500 MG 24 hr tablet Take 1 tablet (500 mg total) every other day by mouth. 05/14/17   Renato Shin, MD  Na Sulfate-K Sulfate-Mg Sulf 17.5-3.13-1.6 GM/177ML SOLN Take 1 kit by mouth as directed. 10/26/17   Fields, Marga Melnick, MD  pantoprazole (PROTONIX) 40 MG tablet Take 1 tablet (40 mg total) by mouth 2 (two) times daily. 07/13/17   Mikey Kirschner, MD  Plecanatide Mellody Memos)  3 MG TABS Take 3 mg by mouth daily. 10/26/17   Mahala Menghini, PA-C  promethazine (PHENERGAN) 25 MG tablet Take 1 tablet (25 mg total) by mouth every 6 (six) hours as needed for nausea or vomiting. 10/15/17   Estill Dooms, NP  tamoxifen (NOLVADEX) 10 MG tablet Take 1 tablet (10 mg total) 2 (two) times daily by mouth. Patient taking differently: Take 10 mg by mouth daily.  05/19/17   Magrinat, Virgie Dad, MD  valACYclovir (VALTREX) 1000 MG tablet Take 1 tablet (1,000 mg total) by mouth daily. 09/14/17   Mikey Kirschner, MD  potassium chloride SA (K-DUR,KLOR-CON) 20 MEQ tablet Take 1 tablet (20 mEq total) by mouth 2 (two) times daily. 07/31/13 08/07/13  Wilmon Arms, MD    Family History Family History  Problem Relation Age of Onset  . Aneurysm Mother        Cerebral  . Hypertension Mother   . Hyperlipidemia Mother   . Stroke Mother   . Coronary artery disease Father   . Hypertension Father   . Hyperlipidemia Father   . Heart attack Father        d. 34  . Lung cancer Father        dx early 17s; former smoker  . Lung cancer Brother   . Lung cancer Brother        d. 88y; smoker  . Lung cancer Sister        paternal half-sister; not a smoker; dx <60; d. 6y  . Leukemia Maternal Aunt        "blood cancer"; d. unspecified age  . Cervical cancer Paternal Aunt        d. late 30s   . Breast cancer Sister 56  . Other Sister        hx of hysterectomy for unspecified reason  . Multiple myeloma Brother        d. 32s  . Lung cancer Brother        dx late 53s; smoker  . Prostate cancer Brother        dx late 34s  . Prostate cancer Brother        dx late 32s  . Lung cancer Brother   . Lung cancer Maternal Aunt        d. unspecified age  . Breast cancer Maternal Aunt        dx unspecified age  . Prostate cancer Cousin        maternal 1st cousin; dx older age  . Breast cancer Other        niece dx early 77s or before  . Leukemia Other        nephew d. 2y; "blood cancer"  . Colon cancer Neg Hx   . Diabetes Neg Hx     Social History Social History   Tobacco Use  . Smoking status: Former Smoker    Packs/day: 0.50    Years: 15.00    Pack years: 7.50    Types: Cigarettes    Last attempt to quit: 07/06/1982    Years since quitting: 35.3  . Smokeless tobacco: Never Used  Substance Use Topics  . Alcohol use: No  . Drug use: No     Allergies   Doxycycline   Review of Systems Review of Systems  Neurological: Negative for tingling and numbness.  All other systems reviewed and are negative.    Physical Exam Updated Vital Signs BP (!) 126/103 (BP Location: Right Arm)  Pulse (!) 58   Temp 98.1 F (36.7 C) (Oral)   Resp 16   LMP 12/04/2008   SpO2 100%   Physical Exam  Constitutional: She is oriented to person, place, and time. She appears well-developed and well-nourished.  HENT:  Head: Atraumatic.  Eyes: Conjunctivae are normal.  Neck: Normal range of motion.  No nuchal rigidity.  Cardiovascular: Normal rate and regular rhythm.  Pulmonary/Chest: Effort normal and breath sounds normal.  Abdominal: Soft.  Musculoskeletal: Normal range of motion. She exhibits no deformity.  Neurological: She is alert and oriented to person, place, and time.  Skin: Skin is warm and dry.  Psychiatric: She has a normal mood and affect.  Nursing note and vitals  reviewed.    ED Treatments / Results  Labs (all labs ordered are listed, but only abnormal results are displayed) Labs Reviewed - No data to display  EKG None  Radiology No results found.  Procedures Procedures (including critical care time)  Medications Ordered in ED Medications - No data to display   Initial Impression / Assessment and Plan / ED Course  I have reviewed the triage vital signs and the nursing notes.  Pertinent labs & imaging results that were available during my care of the patient were reviewed by me and considered in my medical decision making (see chart for details).     Patient with left suprascapular pain, muscular in origin. Conservative therapy recommended and discussed. Patient will be discharged home & is agreeable with above plan. Returns precautions discussed. Pt appears safe for discharge.  Final Clinical Impressions(s) / ED Diagnoses   Final diagnoses:  Acute pain of right shoulder    ED Discharge Orders        Ordered    cyclobenzaprine (FLEXERIL) 10 MG tablet  2 times daily PRN     11/15/17 1828    naproxen (NAPROSYN) 375 MG tablet  2 times daily     11/15/17 1828       Etta Quill, NP 11/15/17 1910    Hayden Rasmussen, MD 11/16/17 [phone]

## 2017-11-15 NOTE — ED Triage Notes (Signed)
Pt complains of pain from the base of the right side of her head down to her mid back. Pain started around 11 am. Worse when she moves. No other complaints.

## 2017-11-15 NOTE — ED Provider Notes (Signed)
MSE was initiated and I personally evaluated the patient and placed orders (if any) at  4:22 PM on Nov 15, 2017.  The patient appears stable so that the remainder of the MSE may be completed by another provider.   Patient placed in Quick Look pathway, seen and evaluated   Chief Complaint: R shoulder pain  HPI:   Patient with plan of pain in the right shoulder blade region.  She had some achiness this morning when she woke up however has gotten progressively worse and hurts whenever she tries to move it.  She took Tylenol without relief.  She denies numbness tingling or weakness, she did not do anything to provoke the symptoms.  ROS: Right shoulder region pain (one)  Physical Exam:   Gen: No distress  Neuro: Awake and Alert  Skin: Warm    Focused Exam: Tender spastic muscles in the right shoulder region with palpation. SusPECT musculoskeletal back pain   Initiation of care has begun. The patient has been counseled on the process, plan, and necessity for staying for the completion/evaluation, and the remainder of the medical screening examination    Ned Grace 11/15/17 1623    Davonna Belling, MD 11/15/17 2356

## 2017-11-25 ENCOUNTER — Ambulatory Visit: Payer: Medicare Other | Admitting: Adult Health

## 2017-12-02 ENCOUNTER — Other Ambulatory Visit: Payer: Self-pay | Admitting: Oncology

## 2017-12-02 DIAGNOSIS — Z9889 Other specified postprocedural states: Secondary | ICD-10-CM

## 2017-12-07 DIAGNOSIS — L299 Pruritus, unspecified: Secondary | ICD-10-CM | POA: Diagnosis not present

## 2017-12-29 ENCOUNTER — Ambulatory Visit
Admission: RE | Admit: 2017-12-29 | Discharge: 2017-12-29 | Disposition: A | Discharge status: Home / Self Care | Payer: Medicare HMO | Source: Ambulatory Visit | Attending: Oncology | Admitting: Oncology

## 2017-12-29 ENCOUNTER — Ambulatory Visit
Admission: RE | Admit: 2017-12-29 | Discharge: 2017-12-29 | Disposition: A | Discharge status: Home / Self Care | Payer: Medicare Other | Source: Ambulatory Visit | Attending: Oncology | Admitting: Oncology

## 2017-12-29 ENCOUNTER — Other Ambulatory Visit: Payer: Self-pay | Admitting: Oncology

## 2017-12-29 ENCOUNTER — Encounter (HOSPITAL_COMMUNITY)
Admission: RE | Admit: 2017-12-29 | Discharge: 2017-12-29 | Disposition: A | Discharge status: Home / Self Care | Payer: Medicare HMO | Source: Ambulatory Visit | Attending: Gastroenterology | Admitting: Gastroenterology

## 2017-12-29 DIAGNOSIS — N6310 Unspecified lump in the right breast, unspecified quadrant: Secondary | ICD-10-CM

## 2017-12-29 DIAGNOSIS — R928 Other abnormal and inconclusive findings on diagnostic imaging of breast: Secondary | ICD-10-CM | POA: Diagnosis not present

## 2017-12-29 DIAGNOSIS — Z5111 Encounter for antineoplastic chemotherapy: Secondary | ICD-10-CM | POA: Diagnosis not present

## 2017-12-29 DIAGNOSIS — Z9889 Other specified postprocedural states: Secondary | ICD-10-CM

## 2017-12-29 DIAGNOSIS — Z853 Personal history of malignant neoplasm of breast: Secondary | ICD-10-CM | POA: Diagnosis not present

## 2017-12-29 DIAGNOSIS — N6489 Other specified disorders of breast: Secondary | ICD-10-CM | POA: Diagnosis not present

## 2018-01-04 ENCOUNTER — Ambulatory Visit (HOSPITAL_COMMUNITY): Payer: Medicare HMO | Admitting: Anesthesiology

## 2018-01-04 ENCOUNTER — Encounter: Payer: Self-pay | Admitting: Gastroenterology

## 2018-01-04 ENCOUNTER — Encounter (HOSPITAL_COMMUNITY): Payer: Self-pay | Admitting: *Deleted

## 2018-01-04 ENCOUNTER — Encounter (HOSPITAL_COMMUNITY)
Admission: RE | Disposition: A | Discharge status: Home / Self Care | Payer: Self-pay | Source: Ambulatory Visit | Attending: Gastroenterology

## 2018-01-04 ENCOUNTER — Ambulatory Visit (HOSPITAL_COMMUNITY)
Admission: RE | Admit: 2018-01-04 | Discharge: 2018-01-04 | Disposition: A | Discharge status: Home / Self Care | Payer: Medicare HMO | Source: Ambulatory Visit | Attending: Gastroenterology | Admitting: Gastroenterology

## 2018-01-04 DIAGNOSIS — Z923 Personal history of irradiation: Secondary | ICD-10-CM | POA: Diagnosis not present

## 2018-01-04 DIAGNOSIS — I10 Essential (primary) hypertension: Secondary | ICD-10-CM | POA: Diagnosis not present

## 2018-01-04 DIAGNOSIS — K921 Melena: Secondary | ICD-10-CM | POA: Diagnosis not present

## 2018-01-04 DIAGNOSIS — F419 Anxiety disorder, unspecified: Secondary | ICD-10-CM | POA: Diagnosis not present

## 2018-01-04 DIAGNOSIS — Z8249 Family history of ischemic heart disease and other diseases of the circulatory system: Secondary | ICD-10-CM | POA: Diagnosis not present

## 2018-01-04 DIAGNOSIS — Z87891 Personal history of nicotine dependence: Secondary | ICD-10-CM | POA: Diagnosis not present

## 2018-01-04 DIAGNOSIS — Z853 Personal history of malignant neoplasm of breast: Secondary | ICD-10-CM | POA: Insufficient documentation

## 2018-01-04 DIAGNOSIS — Z9221 Personal history of antineoplastic chemotherapy: Secondary | ICD-10-CM | POA: Insufficient documentation

## 2018-01-04 DIAGNOSIS — Z79899 Other long term (current) drug therapy: Secondary | ICD-10-CM | POA: Diagnosis not present

## 2018-01-04 DIAGNOSIS — K648 Other hemorrhoids: Secondary | ICD-10-CM | POA: Diagnosis not present

## 2018-01-04 DIAGNOSIS — Z881 Allergy status to other antibiotic agents status: Secondary | ICD-10-CM | POA: Diagnosis not present

## 2018-01-04 DIAGNOSIS — R69 Illness, unspecified: Secondary | ICD-10-CM | POA: Diagnosis not present

## 2018-01-04 DIAGNOSIS — Z8661 Personal history of infections of the central nervous system: Secondary | ICD-10-CM | POA: Diagnosis not present

## 2018-01-04 DIAGNOSIS — F329 Major depressive disorder, single episode, unspecified: Secondary | ICD-10-CM | POA: Diagnosis not present

## 2018-01-04 DIAGNOSIS — M47817 Spondylosis without myelopathy or radiculopathy, lumbosacral region: Secondary | ICD-10-CM | POA: Insufficient documentation

## 2018-01-04 DIAGNOSIS — E7800 Pure hypercholesterolemia, unspecified: Secondary | ICD-10-CM | POA: Insufficient documentation

## 2018-01-04 DIAGNOSIS — Z7984 Long term (current) use of oral hypoglycemic drugs: Secondary | ICD-10-CM | POA: Diagnosis not present

## 2018-01-04 DIAGNOSIS — R011 Cardiac murmur, unspecified: Secondary | ICD-10-CM | POA: Diagnosis not present

## 2018-01-04 DIAGNOSIS — Q438 Other specified congenital malformations of intestine: Secondary | ICD-10-CM | POA: Insufficient documentation

## 2018-01-04 DIAGNOSIS — K219 Gastro-esophageal reflux disease without esophagitis: Secondary | ICD-10-CM | POA: Diagnosis not present

## 2018-01-04 DIAGNOSIS — K581 Irritable bowel syndrome with constipation: Secondary | ICD-10-CM

## 2018-01-04 DIAGNOSIS — K644 Residual hemorrhoidal skin tags: Secondary | ICD-10-CM | POA: Insufficient documentation

## 2018-01-04 DIAGNOSIS — E119 Type 2 diabetes mellitus without complications: Secondary | ICD-10-CM | POA: Insufficient documentation

## 2018-01-04 DIAGNOSIS — K625 Hemorrhage of anus and rectum: Secondary | ICD-10-CM

## 2018-01-04 HISTORY — PX: COLONOSCOPY WITH PROPOFOL: SHX5780

## 2018-01-04 LAB — GLUCOSE, CAPILLARY
Glucose-Capillary: 83 mg/dL (ref 70–99)
Glucose-Capillary: 71 mg/dL (ref 70–99)

## 2018-01-04 SURGERY — COLONOSCOPY WITH PROPOFOL
Anesthesia: Monitor Anesthesia Care

## 2018-01-04 MED ORDER — LIDOCAINE HCL (PF) 1 % IJ SOLN
INTRAMUSCULAR | Status: AC
  Filled 2018-01-04: qty 5

## 2018-01-04 MED ORDER — LACTATED RINGERS IV SOLN: INTRAVENOUS | Status: DC

## 2018-01-04 MED ORDER — PROPOFOL 10 MG/ML IV BOLUS
INTRAVENOUS | Status: DC | PRN
  Administered 2018-01-04 (×2): 20 mg via INTRAVENOUS

## 2018-01-04 MED ORDER — PROPOFOL 500 MG/50ML IV EMUL
INTRAVENOUS | Status: DC | PRN
  Administered 2018-01-04: 150 ug/kg/min via INTRAVENOUS

## 2018-01-04 MED ORDER — MEPERIDINE HCL 100 MG/ML IJ SOLN: 6.2500 mg | INTRAMUSCULAR | Status: DC | PRN

## 2018-01-04 MED ORDER — MIDAZOLAM HCL 2 MG/2ML IJ SOLN
INTRAMUSCULAR | Status: AC
  Filled 2018-01-04: qty 2

## 2018-01-04 MED ORDER — HYDROCODONE-ACETAMINOPHEN 7.5-325 MG PO TABS: 1.0000 | ORAL_TABLET | Freq: Once | ORAL | Status: DC | PRN

## 2018-01-04 MED ORDER — CHLORHEXIDINE GLUCONATE CLOTH 2 % EX PADS: 6.0000 | MEDICATED_PAD | Freq: Once | CUTANEOUS | Status: DC

## 2018-01-04 MED ORDER — LIDOCAINE HCL 1 % IJ SOLN
INTRAMUSCULAR | Status: DC | PRN
  Administered 2018-01-04: 30 mg via INTRADERMAL

## 2018-01-04 MED ORDER — PROMETHAZINE HCL 25 MG/ML IJ SOLN: 6.2500 mg | INTRAMUSCULAR | Status: DC | PRN

## 2018-01-04 MED ORDER — HYDROMORPHONE HCL 1 MG/ML IJ SOLN: 0.2500 mg | INTRAMUSCULAR | Status: DC | PRN

## 2018-01-04 MED ORDER — LACTATED RINGERS IV SOLN
INTRAVENOUS | Status: DC
  Administered 2018-01-04: 1000 mL via INTRAVENOUS

## 2018-01-04 MED ORDER — MIDAZOLAM HCL 5 MG/5ML IJ SOLN
INTRAMUSCULAR | Status: DC | PRN
  Administered 2018-01-04: 2 mg via INTRAVENOUS

## 2018-01-04 NOTE — Op Note (Signed)
[facility] Patient Name: [name] [name] Procedure Date: 01/04/2018 10:23 AM MRN: [id number] Date of Birth: 1961-05-16 Attending MD: Barney Drain MD, MD CSN: [id number] Age: 57 Admit Type: Outpatient Procedure:                Colonoscopy, DIAGNOSTIC Indications:              Hematochezia-LAST TCS 2012 Providers:                Barney Drain MD, MD, Otis Peak B. Sharon Seller, RN,                            Randa Spike, Technician Referring MD:             Rosemary Holms, MD Medicines:                Propofol per Anesthesia Complications:            No immediate complications. Estimated Blood Loss:     Estimated blood loss: none. Procedure:                Pre-Anesthesia Assessment:                           - Prior to the procedure, a History and Physical                            was performed, and patient medications and                            allergies were reviewed. The patient's tolerance of                            previous anesthesia was also reviewed. The risks                            and benefits of the procedure and the sedation                            options and risks were discussed with the patient.                            All questions were answered, and informed consent                            was obtained. Prior Anticoagulants: The patient has                            taken no previous anticoagulant or antiplatelet                            agents. ASA Grade Assessment: II - A patient with                            mild systemic disease. After reviewing the risks  and benefits, the patient was deemed in                            satisfactory condition to undergo the procedure.                            After obtaining informed consent, the colonoscope                            was passed under direct vision. Throughout the                            procedure, the patient's blood pressure, pulse, and           oxygen saturations were monitored continuously. The                            EC-[id number] ([id number]) scope was introduced through                            the anus and advanced to the 3 cm into the ileum.                            The colonoscopy was somewhat difficult due to a                            tortuous colon. Successful completion of the                            procedure was aided by straightening and shortening                            the scope to obtain bowel loop reduction and                            COLOWRAP. The patient tolerated the procedure well.                            The quality of the bowel preparation was excellent.                            The terminal ileum, ileocecal valve, appendiceal                            orifice, and rectum were photographed. Scope In: 10:52:48 AM Scope Out: 11:07:00 AM Scope Withdrawal Time: 0 hours 11 minutes 22 seconds  Total Procedure Duration: 0 hours 14 minutes 12 seconds  Findings:      The terminal ileum appeared normal.      External and internal hemorrhoids were found. The hemorrhoids were       moderate.      The exam was otherwise without abnormality. Impression:               - The examined portion of the ileum was normal.                           -  RECTAL BLEEDING DUE TO MODERATE Internal                            hemorrhoids.                           - The examination was otherwise normal. Moderate Sedation:      Per Anesthesia Care Recommendation:           - High fiber diet.                           - Continue present medications.                           - Await pathology results.                           - Repeat colonoscopy in 10 years for surveillance.                           - Patient has a contact number available for                            emergencies. The signs and symptoms of potential                            delayed complications were discussed with the                             patient. Return to normal activities tomorrow.                            Written discharge instructions were provided to the                            patient. Procedure Code(s):        --- Professional ---                           [phone], Colonoscopy, flexible; diagnostic, including                            collection of specimen(s) by brushing or washing,                            when performed (separate procedure) Diagnosis Code(s):        --- Professional ---                           K64.8, Other hemorrhoids                           K92.1, Melena (includes Hematochezia) CPT copyright 2017 American Medical Association. All rights reserved. The codes documented in this report are preliminary and upon coder review may  be revised to meet current compliance requirements. Barney Drain, MD Barney Drain MD, MD 01/04/2018 11:25:08  AM This report has been signed electronically. Number of Addenda: 0

## 2018-01-04 NOTE — H&P (Signed)
Primary Care Physician:  Mikey Kirschner, MD Primary Gastroenterologist:  Dr. [name]  Pre-Procedure History & Physical: HPI:  [name] [name] is a 57 y.o. female here for brbpr.  Past Medical History:  Diagnosis Date  . Allergic rhinitis   . Anemia, unspecified 05/30/2013  . Anxiety   . Breast cancer ([place]) 03/31/13   left  . Cancer ([place])   . Chest pain 2009   Consultation-[facility], negative chest CT; nl echo in 2005; h/o palpitations  . Colitis 2010   not IBD  . Degenerative joint disease    + degenerative joint disease of the lumbosacral spine  . Depression   . Diabetes ([place])   . Diabetes ([place])   . Dysrhythmia    palpations  . GERD (gastroesophageal reflux disease)    "a little"  . Headache   . Heart murmur    "small"  . Herpes simplex type II infection   . Hypercholesteremia    "slightly high"  . Hypertension    does not have high blood pressure  . 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 Palpitations   . Personal history of chemotherapy   . Personal history of radiation therapy   . Wears glasses     Past Surgical History:  Procedure Laterality Date  . ABDOMINAL HYSTERECTOMY  03/13/2007   TAH ?BSO--Dr Levin Bacon, [place]  . BREAST EXCISIONAL BIOPSY  04/2003   Left; benign disease  . BREAST LUMPECTOMY Left 09/2013  . BREAST LUMPECTOMY WITH NEEDLE LOCALIZATION AND AXILLARY LYMPH NODE DISSECTION Left 09/12/2013   Procedure: BREAST LUMPECTOMY WITH NEEDLE LOCALIZATION AND AXILLARY LYMPH NODE DISSECTION;  Surgeon: [name], MD;  Location: [facility];  Service: General;  Laterality: Left;  and axilla  . BREAST SURGERY    . COLONOSCOPY  10/2010   proctitis; melanosis coli  . EYE SURGERY Left    "fix lazy eye"  . PORTACATH PLACEMENT Right 04/21/2013   Procedure: INSERTION PORT-A-CATH;  Surgeon: [name], MD;  Location: [facility];  Service: General;  Laterality: Right;  . RIGHT OOPHORECTOMY  03/13/2007  . SUBOCCIPITAL CRANIECTOMY CERVICAL LAMINECTOMY N/A  05/10/2014   Procedure: SUBOCCIPITAL CRANIECTOMY CERVICAL LAMINECTOMY/DURAPLASTY;  Surgeon: [name], MD;  Location: [facility];  Service: Neurosurgery;  Laterality: N/A;  suboccipital craniectomy with cervical laminectomy and duraplasty  . TUBAL LIGATION      Prior to Admission medications   Medication Sig Start Date End Date Taking? Authorizing Provider  acetaminophen (TYLENOL) 500 MG tablet Take 500 mg by mouth every 6 (six) hours as needed for moderate pain.    Yes [provider]  ALPRAZolam (XANAX) 1 MG tablet TAKE 1 TABLET BY MOUTH 3 TIMES A DAY AS NEEDED FOR ANXIETY 09/14/17  Yes Mikey Kirschner, MD  atenolol (TENORMIN) 50 MG tablet Take 1 tablet (50 mg total) by mouth 2 (two) times daily. 07/13/17  Yes Mikey Kirschner, MD  atorvastatin (LIPITOR) 20 MG tablet Take 1 tablet (20 mg total) by mouth daily at 6 PM. 08/12/17  Yes Luking, Grace Bushy, MD  cyclobenzaprine (FLEXERIL) 10 MG tablet Take 1 tablet (10 mg total) by mouth 2 (two) times daily as needed for muscle spasms. 11/15/17  Yes Etta Quill, NP  diphenhydramine-acetaminophen (TYLENOL PM) 25-500 MG TABS Take 2 tablets by mouth at bedtime.   Yes [provider]  escitalopram (LEXAPRO) 20 MG tablet Take 1 tablet (20 mg total) by mouth daily. Patient taking differently: Take 20 mg by mouth every evening.  10/19/17  Yes Mikey Kirschner, MD  metFORMIN (GLUCOPHAGE-XR) 500 MG 24 hr tablet Take 1 tablet (500 mg total) every other day by mouth. 05/14/17  Yes Renato Shin, MD  Multiple Vitamin (MULTIVITAMIN WITH MINERALS) TABS tablet Take 1 tablet by mouth daily.   Yes [provider]  Na Sulfate-K Sulfate-Mg Sulf 17.5-3.13-1.6 GM/177ML SOLN Take 1 kit by mouth as directed. 10/26/17  Yes Kellene Mccleary L, MD  pantoprazole (PROTONIX) 40 MG tablet Take 1 tablet (40 mg total) by mouth 2 (two) times daily. Patient taking differently: Take 40 mg by mouth daily before breakfast.  07/13/17  Yes Mikey Kirschner, MD   pimecrolimus (ELIDEL) 1 % cream Apply 1 application topically 2 (two) times daily as needed (FOR SKIN BLEMISHES.).   Yes [provider]  tamoxifen (NOLVADEX) 10 MG tablet Take 1 tablet (10 mg total) 2 (two) times daily by mouth. 05/19/17  Yes Magrinat, Virgie Dad, MD  valACYclovir (VALTREX) 1000 MG tablet Take 1 tablet (1,000 mg total) by mouth daily. Patient taking differently: Take 1,000 mg by mouth every evening.  09/14/17  Yes Mikey Kirschner, MD  vitamin B-12 (CYANOCOBALAMIN) 1000 MCG tablet Take 1,000 mcg by mouth daily.   Yes [provider]  BAYER CONTOUR NEXT TEST test strip TEST ONCE DAILY AS DIRECTED 07/17/16   Mikey Kirschner, MD  blood glucose meter kit and supplies KIT Dispense based on patient and insurance preference. Use as directed by physician. Diabetes type 2. E11.9 03/03/16   Mikey Kirschner, MD  dicyclomine (BENTYL) 10 MG capsule Take 1 capsule (10 mg total) by mouth 4 (four) times daily -  before meals and at bedtime. Patient not taking: Reported on 12/23/2017 10/21/17   Derrek Monaco A, NP  EPINEPHrine 0.3 mg/0.3 mL IJ SOAJ injection INJECT 0.3ML INTO MUSCLE ONCE A DAY AS NEEDED FOR ANAPHYLACTIC REACTIONS. 04/07/17   [provider]  naproxen (NAPROSYN) 375 MG tablet Take 1 tablet (375 mg total) by mouth 2 (two) times daily. Patient not taking: Reported on 12/23/2017 11/15/17   Etta Quill, NP  Plecanatide (TRULANCE) 3 MG TABS Take 3 mg by mouth daily. Patient not taking: Reported on 12/23/2017 10/26/17   Mahala Menghini, PA-C  promethazine (PHENERGAN) 25 MG tablet Take 1 tablet (25 mg total) by mouth every 6 (six) hours as needed for nausea or vomiting. Patient not taking: Reported on 12/23/2017 10/15/17   Derrek Monaco A, NP  potassium chloride SA (K-DUR,KLOR-CON) 20 MEQ tablet Take 1 tablet (20 mEq total) by mouth 2 (two) times daily. 07/31/13 08/07/13  Wilmon Arms, MD    Allergies as of 10/26/2017 - Review Complete 10/26/2017  Allergen  Reaction Noted  . Doxycycline Itching and Swelling 06/27/2012    Family History  Problem Relation Age of Onset  . Aneurysm Mother        Cerebral  . Hypertension Mother   . Hyperlipidemia Mother   . Stroke Mother   . Coronary artery disease Father   . Hypertension Father   . Hyperlipidemia Father   . Heart attack Father        d. 61  . Lung cancer Father        dx early 67s; former smoker  . Lung cancer Brother   . Lung cancer Brother        d. 63y; smoker  . Lung cancer Sister        paternal half-sister; not a smoker; dx <60; d. 21y  . Leukemia Maternal Aunt        "  blood cancer"; d. unspecified age  . Cervical cancer Paternal Aunt        d. late 67s  . Breast cancer Sister 52  . Other Sister        hx of hysterectomy for unspecified reason  . Multiple myeloma Brother        d. 40s  . Lung cancer Brother        dx late 46s; smoker  . Prostate cancer Brother        dx late 36s  . Prostate cancer Brother        dx late 92s  . Lung cancer Brother   . Lung cancer Maternal Aunt        d. unspecified age  . Breast cancer Maternal Aunt        dx unspecified age  . Prostate cancer Cousin        maternal 1st cousin; dx older age  . Breast cancer Other        niece dx early 100s or before  . Leukemia Other        nephew d. 2y; "blood cancer"  . Colon cancer Neg Hx   . Diabetes Neg Hx     Social History   Socioeconomic History  . Marital status: Married    Spouse name: Not on file  . Number of children: 4  . Years of education: Not on file  . Highest education level: Not on file  Occupational History  . Occupation: Environmental manager work    Fish farm manager: [employer]  . Financial resource strain: Not on file  . Food insecurity:    Worry: Not on file    Inability: Not on file  . Transportation needs:    Medical: Not on file    Non-medical: Not on file  Tobacco Use  . Smoking status: Former Smoker    Packs/day: 0.50    Years: 15.00    Pack years:  7.50    Types: Cigarettes    Last attempt to quit: 07/06/1982    Years since quitting: 35.5  . Smokeless tobacco: Never Used  Substance and Sexual Activity  . Alcohol use: No  . Drug use: No  . Sexual activity: Not Currently    Partners: Male    Birth control/protection: Surgical    Comment: TAH  Lifestyle  . Physical activity:    Days per week: Not on file    Minutes per session: Not on file  . Stress: Not on file  Relationships  . Social connections:    Talks on phone: Not on file    Gets together: Not on file    Attends religious service: Not on file    Active member of club or organization: Not on file    Attends meetings of clubs or organizations: Not on file    Relationship status: Not on file  . Intimate partner violence:    Fear of current or ex partner: Not on file    Emotionally abused: Not on file    Physically abused: Not on file    Forced sexual activity: Not on file  Other Topics Concern  . Not on file  Social History Narrative  . Not on file    Review of Systems: See HPI, otherwise negative ROS   Physical Exam: BP 129/74   Pulse 64   Temp 97.9 F (36.6 C) (Oral)   Resp 14   Ht '5\' 1"'  (1.549 m)   Wt 152  lb (68.9 kg)   LMP 12/04/2008   SpO2 99%   BMI 28.72 kg/m  General:   Alert,  pleasant and cooperative in NAD Head:  Normocephalic and atraumatic. Neck:  Supple; Lungs:  Clear throughout to auscultation.    Heart:  Regular rate and rhythm. Abdomen:  Soft, nontender and nondistended. Normal bowel sounds, without guarding, and without rebound.   Neurologic:  Alert and  oriented x4;  grossly normal neurologically.  Impression/Plan:     BRBPR  PLAN: TCS TODAY. DISCUSSED PROCEDURE, BENEFITS, & RISKS: < 1% chance of medication reaction, bleeding, perforation, or rupture of spleen/liver.

## 2018-01-04 NOTE — Transfer of Care (Signed)
Immediate Anesthesia Transfer of Care Note  Patient: [name] [name]  Procedure(s) Performed: COLONOSCOPY WITH PROPOFOL (N/A )  Patient Location: PACU  Anesthesia Type:MAC  Level of Consciousness: awake, alert  and oriented  Airway & Oxygen Therapy: Patient Spontanous Breathing  Post-op Assessment: Report given to RN  Post vital signs: Reviewed and stable  Last Vitals:  Vitals Value Taken Time  BP    Temp    Pulse 69 01/04/2018 11:16 AM  Resp    SpO2 99 % 01/04/2018 11:16 AM  Vitals shown include unvalidated device data.  Last Pain:  Vitals:   01/04/18 1039  TempSrc:   PainSc: 0-No pain      Patients Stated Pain Goal: 8 (12/45/80 9983)  Complications: No apparent anesthesia complications

## 2018-01-04 NOTE — Anesthesia Postprocedure Evaluation (Signed)
Anesthesia Post Note  Patient: Layla Kesling  Procedure(s) Performed: COLONOSCOPY WITH PROPOFOL (N/A )  Patient location during evaluation: PACU Anesthesia Type: MAC Level of consciousness: awake and alert and oriented Pain management: pain level controlled Vital Signs Assessment: post-procedure vital signs reviewed and stable Respiratory status: spontaneous breathing Cardiovascular status: blood pressure returned to baseline and stable Postop Assessment: no apparent nausea or vomiting Anesthetic complications: no     Last Vitals:  Vitals:   01/04/18 0925 01/04/18 0930  BP: 124/71 129/74  Pulse:    Resp: 20 14  Temp:    SpO2: 100% 99%    Last Pain:  Vitals:   01/04/18 1039  TempSrc:   PainSc: 0-No pain                 Dandy Lazaro

## 2018-01-04 NOTE — Discharge Instructions (Signed)
YOUR RECTAL BLEEDING IS DUE TO internal hemorrhoids. YOU DID NOT HAVE ANY POLYPS.  TO AVOID CONSTIPATION/RECTAL BLEEDING:  1. FOLLOW A HIGH FIBER DIET. AVOID ITEMS THAT CAUSE BLOATING. SEE INFO BELOW. 2. USE METAMUCIL 1 OR 2 TIMES A DAY. 3. DRINK ENOUGH WATER TO KEEP URINE LIGHT YELLOW. 4. AVOID BENTYL. ONE OF ITS SIDE EFFECTS IS CONSTIPATION. 5. OPEN LINZESS CAPSULE. PLACE GRANULES IN 4 TEASPOONS OF WATER. STIR IT FOR 30 SECONDS. TAKE 2 TSP OF THE WATER DAILY if still having watery stools then take AND TAKE 1 TSP DAILY. YOU DO NOT NEED TO TAKE THE GRANULES THE MEDICINE IS IN THE WATER. IT MAY CAUSE EXPLOSIVE DIARRHEA.    USE PREPARATION H FOUR TIMES  A DAY WHEN NEEDED TO RELIEVE RECTAL PAIN/PRESSURE/BLEEDING.  FOLLOW UP IN 6 MOS.   NEXT COLONOSCOPY IN 10 YEARS.   Colonoscopy Care After Read the instructions outlined below and refer to this sheet in the next week. These discharge instructions provide you with general information on caring for yourself after you leave the hospital. While your treatment has been planned according to the most current medical practices available, unavoidable complications occasionally occur. If you have any problems or questions after discharge, call DR. Gabe Glace, [phone].  ACTIVITY  You may resume your regular activity, but move at a slower pace for the next 24 hours.   Take frequent rest periods for the next 24 hours.   Walking will help get rid of the air and reduce the bloated feeling in your belly (abdomen).   No driving for 24 hours (because of the medicine (anesthesia) used during the test).   You may shower.   Do not sign any important legal documents or operate any machinery for 24 hours (because of the anesthesia used during the test).    NUTRITION  Drink plenty of fluids.   You may resume your normal diet as instructed by your doctor.   Begin with a light meal and progress to your normal diet. Heavy or fried foods are harder to  digest and may make you feel sick to your stomach (nauseated).   Avoid alcoholic beverages for 24 hours or as instructed.    MEDICATIONS  You may resume your normal medications.   WHAT YOU CAN EXPECT TODAY  Some feelings of bloating in the abdomen.   Passage of more gas than usual.   Spotting of blood in your stool or on the toilet paper  .  IF YOU HAD POLYPS REMOVED DURING THE COLONOSCOPY:  Eat a soft diet IF YOU HAVE NAUSEA, BLOATING, ABDOMINAL PAIN, OR VOMITING.    FINDING OUT THE RESULTS OF YOUR TEST Not all test results are available during your visit. DR. [name] WILL CALL YOU WITHIN 7 DAYS OF YOUR PROCEDUE WITH YOUR RESULTS. Do not assume everything is normal if you have not heard from DR. Tonetta Napoles IN ONE WEEK, CALL HER OFFICE AT [phone].  SEEK IMMEDIATE MEDICAL ATTENTION AND CALL THE OFFICE: [phone] IF:  You have more than a spotting of blood in your stool.   Your belly is swollen (abdominal distention).   You are nauseated or vomiting.   You have a temperature over 101F.   You have abdominal pain or discomfort that is severe or gets worse throughout the day.  Constipation in Adults Constipation is having fewer than 2 bowel movements per week. Usually, the stools are hard. As we grow older, constipation is more common. If you try to fix constipation with laxatives, the problem  may get worse. This is because laxatives taken over a long period of time make the colon muscles weaker. A low-fiber diet, not taking in enough fluids, HAVING VAGINAL DELIVERIES, and taking some medicines may make these problems worse.  HOME CARE INSTRUCTIONS  Constipation is usually best cared for without medicines. Increasing dietary fiber and eating more fruits and vegetables is the best way to manage constipation.   Slowly increase fiber intake to 25 to 38 grams per day. Whole grains, fruits, vegetables, and legumes are good sources of fiber. A dietitian can further help you  incorporate high-fiber foods into your diet.   Drink enough water and fluids to keep your urine clear or pale yellow.   A fiber supplement may be added to your diet if you cannot get enough fiber from foods.   Increasing your activities also helps improve regularity.   High-Fiber Diet A high-fiber diet changes your normal diet to include more whole grains, legumes, fruits, and vegetables. Changes in the diet involve replacing refined carbohydrates with unrefined foods. The calorie level of the diet is essentially unchanged. The Dietary Reference Intake (recommended amount) for adult males is 38 grams per day. For adult females, it is 25 grams per day. Pregnant and lactating women should consume 28 grams of fiber per day. Fiber is the intact part of a plant that is not broken down during digestion. Functional fiber is fiber that has been isolated from the plant to provide a beneficial effect in the body.  PURPOSE  Increase stool bulk.   Ease and regulate bowel movements.   Lower cholesterol.   REDUCE RISK OF COLON CANCER  INDICATIONS THAT YOU NEED MORE FIBER  Constipation and hemorrhoids.   Uncomplicated diverticulosis (intestine condition) and irritable bowel syndrome.   Weight management.   As a protective measure against hardening of the arteries (atherosclerosis), diabetes, and cancer.   GUIDELINES FOR INCREASING FIBER IN THE DIET  Start adding fiber to the diet slowly. A gradual increase of about 5 more grams (2 slices of whole-wheat bread, 2 servings of most fruits or vegetables, or 1 bowl of high-fiber cereal) per day is best. Too rapid an increase in fiber may result in constipation, flatulence, and bloating.   Drink enough water and fluids to keep your urine clear or pale yellow. Water, juice, or caffeine-free drinks are recommended. Not drinking enough fluid may cause constipation.   Eat a variety of high-fiber foods rather than one type of fiber.   Try to increase  your intake of fiber through using high-fiber foods rather than fiber pills or supplements that contain small amounts of fiber.   The goal is to change the types of food eaten. Do not supplement your present diet with high-fiber foods, but replace foods in your present diet.   INCLUDE A VARIETY OF FIBER SOURCES  Replace refined and processed grains with whole grains, canned fruits with fresh fruits, and incorporate other fiber sources. White rice, white breads, and most bakery goods contain little or no fiber.   Brown whole-grain rice, buckwheat oats, and many fruits and vegetables are all good sources of fiber. These include: broccoli, Brussels sprouts, cabbage, cauliflower, beets, sweet potatoes, white potatoes (skin on), carrots, tomatoes, eggplant, squash, berries, fresh fruits, and dried fruits.   Cereals appear to be the richest source of fiber. Cereal fiber is found in whole grains and bran. Bran is the fiber-rich outer coat of cereal grain, which is largely removed in refining. In whole-grain cereals, the bran  remains. In breakfast cereals, the largest amount of fiber is found in those with "bran" in their names. The fiber content is sometimes indicated on the label.   You may need to include additional fruits and vegetables each day.   In baking, for 1 cup white flour, you may use the following substitutions:   1 cup whole-wheat flour minus 2 tablespoons.   1/2 cup white flour plus 1/2 cup whole-wheat flour.   Hemorrhoids Hemorrhoids are dilated (enlarged) veins around the rectum. Sometimes clots will form in the veins. This makes them swollen and painful. These are called thrombosed hemorrhoids. Causes of hemorrhoids include:  Constipation.   Straining to have a bowel movement.   HEAVY LIFTING  HOME CARE INSTRUCTIONS  Eat a well balanced diet and drink 6 to 8 glasses of water every day to avoid constipation. You may also use a bulk laxative.   Avoid straining to have bowel  movements.   Keep anal area dry and clean.   Do not use a donut shaped pillow or sit on the toilet for long periods. This increases blood pooling and pain.   Move your bowels when your body has the urge; this will require less straining and will decrease pain and pressure.

## 2018-01-04 NOTE — Anesthesia Preprocedure Evaluation (Signed)
Anesthesia Evaluation  Patient identified by MRN, date of birth, ID band Patient awake    Reviewed: Allergy & Precautions, H&P , NPO status , Patient's Chart, lab work & pertinent test results, reviewed documented beta blocker date and time   Airway Mallampati: III  TM Distance: >3 FB Neck ROM: full    Dental no notable dental hx. 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 Controlled, Type 2  Renal/GU negative Renal ROS  negative genitourinary   Musculoskeletal   Abdominal   Peds  Hematology negative hematology ROS (+) anemia ,   Anesthesia Other Findings Reports heart "palpitations", denies any known arrhytmia  Reproductive/Obstetrics negative OB ROS                             Anesthesia Physical Anesthesia Plan  ASA: III  Anesthesia Plan: MAC   Post-op Pain Management:    Induction:   PONV Risk Score and Plan:   Airway Management Planned:   Additional Equipment:   Intra-op Plan:   Post-operative Plan:   Informed Consent: I have reviewed the patients History and Physical, chart, labs and discussed the procedure including the risks, benefits and alternatives for the proposed anesthesia with the patient or authorized representative who has indicated his/her understanding and acceptance.   Dental Advisory Given  Plan Discussed with: CRNA and Anesthesiologist  Anesthesia Plan Comments:         Anesthesia Quick Evaluation

## 2018-01-10 ENCOUNTER — Encounter (HOSPITAL_COMMUNITY): Payer: Self-pay | Admitting: Gastroenterology

## 2018-01-18 ENCOUNTER — Other Ambulatory Visit: Payer: Self-pay | Admitting: Family Medicine

## 2018-01-19 NOTE — Telephone Encounter (Signed)
Six mo ok 

## 2018-01-25 NOTE — Progress Notes (Signed)
ID: [name] [name] OB: 1960-11-07  MR#: [id number]  UKG#:[id number]  PCP: Mikey Kirschner, MD ; Pearson Forster, NP GYN:   Josefa Half, MD  SU: Alphonsa Overall, MD [place]:  Thea Silversmith, MD OTHER MD: Freida Busman, MD;  Neysa Bonito, MD, Sharman Crate MD  CHIEF COMPLAINT:  Left Breast Cancer  CURRENT TREATMENT: Tamoxifen  BREAST CANCER HISTORY: From the original intake note 04/12/2013:  [name] [name] palpated a mass in her left breast in late August 2014. She was already scheduled for screening mammography at [facility] and this was performed 03/13/2013. Indeed a possible mass was noted in the left breast and on 03/29/2013 the patient underwent diagnostic left mammography and left ultrasonography. This showed a 2.5 cm irregular mass in the upper outer quadrant of the left breast. This was palpable.by ultrasound there was a 1.6 cm irregular hypoechoic mass in the area in question, and in addition to enlarged left axillary lymph nodes were noted, the largest measuring 2.5 cm.  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  Bilateral breast MRI 04/11/2013 shows 3 abnormal enhancing masses in the upper outer quadrant of the left breast measuring altogether 6.4 cm. The largest separate mass measured 2.7 cm. There were abnormal or large lymph nodes in the left axilla. Ultrasound guided biopsy of the 2 smaller masses has been scheduled.  The patient's subsequent history is as detailed below   INTERVAL HISTORY: [name] [name] returns today for follow-up and treatment of her estrogen receptor positive breast cancer.   She continues on tamoxifen, with good tolerance. She  notes hot flashes that are moderate and the hot flashes do not wake her from sleep. she denies increase in vaginal wetness.   Since her last visit to the office, on 12/29/2017 she had bilateral diagnostic mammography with ultrasonography including the right axilla, showing: Breast density category B. Interval 4 mm oil cyst within a fat lobule in the 3:30 o'clock position of the right breast. No evidence of malignancy in either breast.    REVIEW OF SYSTEMS: [name] reports that for exercise, she goes to the [employer] twice a week and watches her grandson. She has had intermittent headaches and on last week, she had 3 headaches. She contributes her headaches to tension and she has been dealing with them for awhile now. She notes life stressors, however, she is unsure of any specific life stressors. She notes that her family is doing well and she is in the process of becoming a foster parent to an older child. She is not currently working at this time. She denies visual changes, nausea, vomiting, or dizziness. There has been no unusual cough, phlegm production, or pleurisy. This been no change in bowel or bladder habits. She denies unexplained fatigue or unexplained weight loss, bleeding, rash, or fever. A detailed review of systems was otherwise stable.     PAST MEDICAL HISTORY: Past Medical History:  Diagnosis Date  . Allergic rhinitis   . Anemia, unspecified 05/30/2013  . Anxiety   . Breast cancer ([place]) 03/31/13   left  . Cancer ([place])   . Chest pain 2009   Consultation-[facility], negative chest CT; nl echo in  ID: [name] [name] OB: 1960-11-07  MR#: [id number]  UKG#:[id number]  PCP: Mikey Kirschner, MD ; Pearson Forster, NP GYN:   Josefa Half, MD  SU: Alphonsa Overall, MD [place]:  Thea Silversmith, MD OTHER MD: Freida Busman, MD;  Neysa Bonito, MD, Sharman Crate MD  CHIEF COMPLAINT:  Left Breast Cancer  CURRENT TREATMENT: Tamoxifen  BREAST CANCER HISTORY: From the original intake note 04/12/2013:  [name] [name] palpated a mass in her left breast in late August 2014. She was already scheduled for screening mammography at [facility] and this was performed 03/13/2013. Indeed a possible mass was noted in the left breast and on 03/29/2013 the patient underwent diagnostic left mammography and left ultrasonography. This showed a 2.5 cm irregular mass in the upper outer quadrant of the left breast. This was palpable.by ultrasound there was a 1.6 cm irregular hypoechoic mass in the area in question, and in addition to enlarged left axillary lymph nodes were noted, the largest measuring 2.5 cm.  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  Bilateral breast MRI 04/11/2013 shows 3 abnormal enhancing masses in the upper outer quadrant of the left breast measuring altogether 6.4 cm. The largest separate mass measured 2.7 cm. There were abnormal or large lymph nodes in the left axilla. Ultrasound guided biopsy of the 2 smaller masses has been scheduled.  The patient's subsequent history is as detailed below   INTERVAL HISTORY: [name] [name] returns today for follow-up and treatment of her estrogen receptor positive breast cancer.   She continues on tamoxifen, with good tolerance. She  notes hot flashes that are moderate and the hot flashes do not wake her from sleep. she denies increase in vaginal wetness.   Since her last visit to the office, on 12/29/2017 she had bilateral diagnostic mammography with ultrasonography including the right axilla, showing: Breast density category B. Interval 4 mm oil cyst within a fat lobule in the 3:30 o'clock position of the right breast. No evidence of malignancy in either breast.    REVIEW OF SYSTEMS: [name] reports that for exercise, she goes [facility] [employer] twice a week and watches her grandson. She has had intermittent headaches and on last week, she had 3 headaches. She contributes her headaches to tension and she has been dealing with them for awhile now. She notes life stressors, however, she is unsure of any specific life stressors. She notes that her family is doing well and she is in the process of becoming a foster parent to an older child. She is not currently working at this time. She denies visual changes, nausea, vomiting, or dizziness. There has been no unusual cough, phlegm production, or pleurisy. This been no change in bowel or bladder habits. She denies unexplained fatigue or unexplained weight loss, bleeding, rash, or fever. A detailed review of systems was otherwise stable.     PAST MEDICAL HISTORY: Past Medical History:  Diagnosis Date  . Allergic rhinitis   . Anemia, unspecified 05/30/2013  . Anxiety   . Breast cancer ([place]) 03/31/13   left  . Cancer ([place])   . Chest pain 2009   Consultation-[facility], negative chest CT; nl echo in  ID: [name] [name] OB: 1960-11-07  MR#: [id number]  UKG#:[id number]  PCP: Mikey Kirschner, MD ; Pearson Forster, NP GYN:   Josefa Half, MD  SU: Alphonsa Overall, MD [place]:  Thea Silversmith, MD OTHER MD: Freida Busman, MD;  Neysa Bonito, MD, Sharman Crate MD  CHIEF COMPLAINT:  Left Breast Cancer  CURRENT TREATMENT: Tamoxifen  BREAST CANCER HISTORY: From the original intake note 04/12/2013:  [name] [name] palpated a mass in her left breast in late August 2014. She was already scheduled for screening mammography at [facility] and this was performed 03/13/2013. Indeed a possible mass was noted in the left breast and on 03/29/2013 the patient underwent diagnostic left mammography and left ultrasonography. This showed a 2.5 cm irregular mass in the upper outer quadrant of the left breast. This was palpable.by ultrasound there was a 1.6 cm irregular hypoechoic mass in the area in question, and in addition to enlarged left axillary lymph nodes were noted, the largest measuring 2.5 cm.  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  Bilateral breast MRI 04/11/2013 shows 3 abnormal enhancing masses in the upper outer quadrant of the left breast measuring altogether 6.4 cm. The largest separate mass measured 2.7 cm. There were abnormal or large lymph nodes in the left axilla. Ultrasound guided biopsy of the 2 smaller masses has been scheduled.  The patient's subsequent history is as detailed below   INTERVAL HISTORY: [name] [name] returns today for follow-up and treatment of her estrogen receptor positive breast cancer.   She continues on tamoxifen, with good tolerance. She  notes hot flashes that are moderate and the hot flashes do not wake her from sleep. she denies increase in vaginal wetness.   Since her last visit to the office, on 12/29/2017 she had bilateral diagnostic mammography with ultrasonography including the right axilla, showing: Breast density category B. Interval 4 mm oil cyst within a fat lobule in the 3:30 o'clock position of the right breast. No evidence of malignancy in either breast.    REVIEW OF SYSTEMS: [name] reports that for exercise, she goes [facility] [place] [employer] twice a week and watches her grandson. She has had intermittent headaches and on last week, she had 3 headaches. She contributes her headaches to tension and she has been dealing with them for awhile now. She notes life stressors, however, she is unsure of any specific life stressors. She notes that her family is doing well and she is in the process of becoming a foster parent to an older child. She is not currently working at this time. She denies visual changes, nausea, vomiting, or dizziness. There has been no unusual cough, phlegm production, or pleurisy. This been no change in bowel or bladder habits. She denies unexplained fatigue or unexplained weight loss, bleeding, rash, or fever. A detailed review of systems was otherwise stable.     PAST MEDICAL HISTORY: Past Medical History:  Diagnosis Date  . Allergic rhinitis   . Anemia, unspecified 05/30/2013  . Anxiety   . Breast cancer ([place]) 03/31/13   left  . Cancer ([place])   . Chest pain 2009   Consultation-[facility], negative chest CT; nl echo in  ID: [name] [name] OB: 1960-11-07  MR#: [id number]  UKG#:[id number]  PCP: Mikey Kirschner, MD ; Pearson Forster, NP GYN:   Josefa Half, MD  [place]: Alphonsa Overall, MD [place]:  Thea Silversmith, MD OTHER MD: Freida Busman, MD;  Neysa Bonito, MD, Sharman Crate MD  CHIEF COMPLAINT:  Left Breast Cancer  CURRENT TREATMENT: Tamoxifen  BREAST CANCER HISTORY: From the original intake note 04/12/2013:  [name] [name] palpated a mass in her left breast in late August 2014. She was already scheduled for screening mammography at [facility] and this was performed 03/13/2013. Indeed a possible mass was noted in the left breast and on 03/29/2013 the patient underwent diagnostic left mammography and left ultrasonography. This showed a 2.5 cm irregular mass in the upper outer quadrant of the left breast. This was palpable.by ultrasound there was a 1.6 cm irregular hypoechoic mass in the area in question, and in addition to enlarged left axillary lymph nodes were noted, the largest measuring 2.5 cm.  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  Bilateral breast MRI 04/11/2013 shows 3 abnormal enhancing masses in the upper outer quadrant of the left breast measuring altogether 6.4 cm. The largest separate mass measured 2.7 cm. There were abnormal or large lymph nodes in the left axilla. Ultrasound guided biopsy of the 2 smaller masses has been scheduled.  The patient's subsequent history is as detailed below   INTERVAL HISTORY: [name] [name] returns today for follow-up and treatment of her estrogen receptor positive breast cancer.   She continues on tamoxifen, with good tolerance. She  notes hot flashes that are moderate and the hot flashes do not wake her from sleep. she denies increase in vaginal wetness.   Since her last visit to the office, on 12/29/2017 she had bilateral diagnostic mammography with ultrasonography including the right axilla, showing: Breast density category B. Interval 4 mm oil cyst within a fat lobule in the 3:30 o'clock position of the right breast. No evidence of malignancy in either breast.    REVIEW OF SYSTEMS: [name] reports that for exercise, she goes [facility] [place] [employer] twice a [place] and watches her grandson. She has had intermittent headaches and on last week, she had 3 headaches. She contributes her headaches to tension and she has been dealing with them for awhile now. She notes life stressors, however, she is unsure of any specific life stressors. She notes that her family is doing well and she is in the process of becoming a foster parent to an older child. She is not currently working at this time. She denies visual changes, nausea, vomiting, or dizziness. There has been no unusual cough, phlegm production, or pleurisy. This been no change in bowel or bladder habits. She denies unexplained fatigue or unexplained weight loss, bleeding, rash, or fever. A detailed review of systems was otherwise stable.     PAST MEDICAL HISTORY: Past Medical History:  Diagnosis Date  . Allergic rhinitis   . Anemia, unspecified 05/30/2013  . Anxiety   . Breast cancer ([place]) 03/31/13   left  . Cancer ([place])   . Chest pain 2009   Consultation-[facility], negative chest CT; nl echo in  ID: [name] [name] OB: 1960-11-07  MR#: [id number]  UKG#:[id number]  PCP: Mikey Kirschner, MD ; Pearson Forster, NP GYN:   Josefa Half, MD  SU: Alphonsa Overall, MD [place]:  Thea Silversmith, MD OTHER MD: Freida Busman, MD;  Neysa Bonito, MD, Sharman Crate MD  CHIEF COMPLAINT:  Left Breast Cancer  CURRENT TREATMENT: Tamoxifen  BREAST CANCER HISTORY: From the original intake note 04/12/2013:  [name] [name] palpated a mass in her left breast in late August 2014. She was already scheduled for screening mammography at [facility] and this was performed 03/13/2013. Indeed a possible mass was noted in the left breast and on 03/29/2013 the patient underwent diagnostic left mammography and left ultrasonography. This showed a 2.5 cm irregular mass in the upper outer quadrant of the left breast. This was palpable.by ultrasound there was a 1.6 cm irregular hypoechoic mass in the area in question, and in addition to enlarged left axillary lymph nodes were noted, the largest measuring 2.5 cm.  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  Bilateral breast MRI 04/11/2013 shows 3 abnormal enhancing masses in the upper outer quadrant of the left breast measuring altogether 6.4 cm. The largest separate mass measured 2.7 cm. There were abnormal or large lymph nodes in the left axilla. Ultrasound guided biopsy of the 2 smaller masses has been scheduled.  The patient's subsequent history is as detailed below   INTERVAL HISTORY: [name] [name] returns today for follow-up and treatment of her estrogen receptor positive breast cancer.   She continues on tamoxifen, with good tolerance. She  notes hot flashes that are moderate and the hot flashes do not wake her from sleep. she denies increase in vaginal wetness.   Since her last visit to the office, on 12/29/2017 she had bilateral diagnostic mammography with ultrasonography including the right axilla, showing: Breast density category B. Interval 4 mm oil cyst within a fat lobule in the 3:30 o'clock position of the right breast. No evidence of malignancy in either breast.    REVIEW OF SYSTEMS: [name] reports that for exercise, she goes [facility] [place] [employer] twice a [place] and watches her grandson. She has had intermittent headaches and on last week, she had 3 headaches. She contributes her headaches to tension and she has been dealing with them for awhile now. She notes life stressors, however, she is unsure of any specific life stressors. She notes that her family is doing well and she is in the process of becoming a foster parent to an older child. She is not currently working at this time. She denies visual changes, nausea, vomiting, or dizziness. There has been no unusual cough, phlegm production, or pleurisy. This been no change in bowel or bladder habits. She denies unexplained fatigue or unexplained weight loss, bleeding, rash, or fever. A detailed review of systems was otherwise stable.     PAST MEDICAL HISTORY: Past Medical History:  Diagnosis Date  . Allergic rhinitis   . Anemia, unspecified 05/30/2013  . Anxiety   . Breast cancer ([place]) 03/31/13   left  . Cancer ([place])   . Chest pain 2009   Consultation-[facility], negative chest CT; nl echo in  ID: [name] [name] OB: 1960-11-07  MR#: [id number]  UKG#:[id number]  PCP: Mikey Kirschner, MD ; Pearson Forster, NP GYN:   Josefa Half, MD  [place]: Alphonsa Overall, MD [place]:  Thea Silversmith, MD OTHER MD: Freida Busman, MD;  Neysa Bonito, MD, Sharman Crate MD  CHIEF COMPLAINT:  Left Breast Cancer  CURRENT TREATMENT: Tamoxifen  BREAST CANCER HISTORY: From the original intake note 04/12/2013:  [name] [name] palpated a mass in her left breast in late August 2014. She was already scheduled for screening mammography at [facility] and this was performed 03/13/2013. Indeed a possible mass was noted in the left breast and on 03/29/2013 the patient underwent diagnostic left mammography and left ultrasonography. This showed a 2.5 cm irregular mass in the upper outer quadrant of the left breast. This was palpable.by ultrasound there was a 1.6 cm irregular hypoechoic mass in the area in question, and in addition to enlarged left axillary lymph nodes were noted, the largest measuring 2.5 cm.  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  Bilateral breast MRI 04/11/2013 shows 3 abnormal enhancing masses in the upper outer quadrant of the left breast measuring altogether 6.4 cm. The largest separate mass measured 2.7 cm. There were abnormal or large lymph nodes in the left axilla. Ultrasound guided biopsy of the 2 smaller masses has been scheduled.  The patient's subsequent history is as detailed below   INTERVAL HISTORY: [name] [name] returns today for follow-up and treatment of her estrogen receptor positive breast cancer.   She continues on tamoxifen, with good tolerance. She  notes hot flashes that are moderate and the hot flashes do not wake her from sleep. she denies increase in vaginal wetness.   Since her last visit to the office, on 12/29/2017 she had bilateral diagnostic mammography with ultrasonography including the right axilla, showing: Breast density category B. Interval 4 mm oil cyst within a fat lobule in the 3:30 o'clock position of the right breast. No evidence of malignancy in either breast.    REVIEW OF SYSTEMS: [name] reports that for exercise, she goes [facility] [place] [employer] twice a week and watches her grandson. She has had intermittent headaches and on last week, she had 3 headaches. She contributes her headaches to tension and she has been dealing with them for awhile now. She notes life stressors, however, she is unsure of any specific life stressors. She notes that her family is doing well and she is in the process of becoming a foster parent to an older child. She is not currently working at this time. She denies visual changes, nausea, vomiting, or dizziness. There has been no unusual cough, phlegm production, or pleurisy. This been no change in bowel or bladder habits. She denies unexplained fatigue or unexplained weight loss, bleeding, rash, or fever. A detailed review of systems was otherwise stable.     PAST MEDICAL HISTORY: Past Medical History:  Diagnosis Date  . Allergic rhinitis   . Anemia, unspecified 05/30/2013  . Anxiety   . Breast cancer ([place]) 03/31/13   left  . Cancer ([place])   . Chest pain 2009   Consultation-[facility], negative chest CT; nl echo in

## 2018-01-26 ENCOUNTER — Inpatient Hospital Stay (HOSPITAL_BASED_OUTPATIENT_CLINIC_OR_DEPARTMENT_OTHER): Payer: Medicare HMO | Admitting: Oncology

## 2018-01-26 ENCOUNTER — Inpatient Hospital Stay: Payer: Medicare HMO | Attending: Oncology

## 2018-01-26 VITALS — BP 131/63 | HR 53 | Temp 98.5°F | Resp 18 | Ht 61.0 in | Wt 151.9 lb

## 2018-01-26 DIAGNOSIS — Z87891 Personal history of nicotine dependence: Secondary | ICD-10-CM | POA: Insufficient documentation

## 2018-01-26 DIAGNOSIS — M8580 Other specified disorders of bone density and structure, unspecified site: Secondary | ICD-10-CM

## 2018-01-26 DIAGNOSIS — C50412 Malignant neoplasm of upper-outer quadrant of left female breast: Secondary | ICD-10-CM

## 2018-01-26 DIAGNOSIS — N951 Menopausal and female climacteric states: Secondary | ICD-10-CM | POA: Diagnosis not present

## 2018-01-26 DIAGNOSIS — Z170 Estrogen receptor positive status [ER+]: Secondary | ICD-10-CM | POA: Insufficient documentation

## 2018-01-26 DIAGNOSIS — R51 Headache: Secondary | ICD-10-CM | POA: Diagnosis not present

## 2018-01-26 DIAGNOSIS — Z79811 Long term (current) use of aromatase inhibitors: Secondary | ICD-10-CM

## 2018-01-26 LAB — CBC WITH DIFFERENTIAL/PLATELET
Basophils Absolute: 0 10*3/uL (ref 0.0–0.1)
Basophils Relative: 0 %
Eosinophils Absolute: 0.1 10*3/uL (ref 0.0–0.5)
Eosinophils Relative: 1 %
HCT: 38.1 % (ref 34.8–46.6)
Hemoglobin: 12.6 g/dL (ref 11.6–15.9)
Lymphocytes Relative: 34 %
Lymphs Abs: 2.5 10*3/uL (ref 0.9–3.3)
MCH: 28.1 pg (ref 25.1–34.0)
MCHC: 33.1 g/dL (ref 31.5–36.0)
MCV: 85 fL (ref 79.5–101.0)
Monocytes Absolute: 0.4 10*3/uL (ref 0.1–0.9)
Monocytes Relative: 6 %
Neutro Abs: 4.4 10*3/uL (ref 1.5–6.5)
Neutrophils Relative %: 59 %
Platelets: 198 10*3/uL (ref 145–400)
RBC: 4.48 MIL/uL (ref 3.70–5.45)
RDW: 14 % (ref 11.2–14.5)
WBC: 7.5 10*3/uL (ref 3.9–10.3)

## 2018-01-26 LAB — COMPREHENSIVE METABOLIC PANEL
ALT: 11 U/L (ref 0–44)
AST: 14 U/L — ABNORMAL LOW (ref 15–41)
Albumin: 4.1 g/dL (ref 3.5–5.0)
Alkaline Phosphatase: 53 U/L (ref 38–126)
Anion gap: 9 (ref 5–15)
BUN: 10 mg/dL (ref 6–20)
CO2: 28 mmol/L (ref 22–32)
Calcium: 9.4 mg/dL (ref 8.9–10.3)
Chloride: 106 mmol/L (ref 98–111)
Creatinine, Ser: 0.87 mg/dL (ref 0.44–1.00)
GFR calc Af Amer: >60 mL/min (ref >60)
GFR calc non Af Amer: >60 mL/min (ref >60)
Glucose, Bld: 73 mg/dL (ref 70–99)
Potassium: 3.6 mmol/L (ref 3.5–5.1)
Sodium: 143 mmol/L (ref 135–145)
Total Bilirubin: 0.5 mg/dL (ref 0.3–1.2)
Total Protein: 7.6 g/dL (ref 6.5–8.1)

## 2018-01-31 ENCOUNTER — Other Ambulatory Visit: Payer: Self-pay | Admitting: Family Medicine

## 2018-02-02 ENCOUNTER — Emergency Department (HOSPITAL_COMMUNITY)
Admission: EM | Admit: 2018-02-02 | Discharge: 2018-02-03 | Disposition: A | Discharge status: Home / Self Care | Payer: Medicare HMO | Attending: Emergency Medicine | Admitting: Emergency Medicine

## 2018-02-02 ENCOUNTER — Other Ambulatory Visit: Payer: Self-pay

## 2018-02-02 ENCOUNTER — Encounter (HOSPITAL_COMMUNITY): Payer: Self-pay | Admitting: *Deleted

## 2018-02-02 DIAGNOSIS — Z7984 Long term (current) use of oral hypoglycemic drugs: Secondary | ICD-10-CM | POA: Diagnosis not present

## 2018-02-02 DIAGNOSIS — I10 Essential (primary) hypertension: Secondary | ICD-10-CM | POA: Diagnosis not present

## 2018-02-02 DIAGNOSIS — Z79899 Other long term (current) drug therapy: Secondary | ICD-10-CM | POA: Diagnosis not present

## 2018-02-02 DIAGNOSIS — M7072 Other bursitis of hip, left hip: Secondary | ICD-10-CM | POA: Diagnosis not present

## 2018-02-02 DIAGNOSIS — R739 Hyperglycemia, unspecified: Secondary | ICD-10-CM

## 2018-02-02 DIAGNOSIS — G44209 Tension-type headache, unspecified, not intractable: Secondary | ICD-10-CM | POA: Diagnosis not present

## 2018-02-02 DIAGNOSIS — E1165 Type 2 diabetes mellitus with hyperglycemia: Secondary | ICD-10-CM | POA: Diagnosis not present

## 2018-02-02 DIAGNOSIS — Z87891 Personal history of nicotine dependence: Secondary | ICD-10-CM | POA: Insufficient documentation

## 2018-02-02 LAB — I-STAT CHEM 8, ED
BUN: 11 mg/dL (ref 6–20)
Calcium, Ion: 1.22 mmol/L (ref 1.15–1.40)
Chloride: 107 mmol/L (ref 98–111)
Creatinine, Ser: 0.8 mg/dL (ref 0.44–1.00)
Glucose, Bld: 216 mg/dL — ABNORMAL HIGH (ref 70–99)
HCT: 38 % (ref 36.0–46.0)
Hemoglobin: 12.9 g/dL (ref 12.0–15.0)
Potassium: 4 mmol/L (ref 3.5–5.1)
Sodium: 144 mmol/L (ref 135–145)
TCO2: 23 mmol/L (ref 22–32)

## 2018-02-02 LAB — CBG MONITORING, ED: Glucose-Capillary: 213 mg/dL — ABNORMAL HIGH (ref 70–99)

## 2018-02-02 MED ORDER — ISOMETHEPTENE-DICHLORAL-APAP 65-100-325 MG PO CAPS: 1.0000 | ORAL_CAPSULE | Freq: Four times a day (QID) | ORAL | 0 refills | Status: DC | PRN

## 2018-02-02 MED ORDER — DIPHENHYDRAMINE HCL 25 MG PO CAPS
25.0000 mg | ORAL_CAPSULE | Freq: Once | ORAL | Status: AC
  Administered 2018-02-02: 25 mg via ORAL
  Filled 2018-02-02: qty 1

## 2018-02-02 MED ORDER — ACETAMINOPHEN 500 MG PO TABS
1000.0000 mg | ORAL_TABLET | Freq: Once | ORAL | Status: AC
  Administered 2018-02-02: 1000 mg via ORAL
  Filled 2018-02-02: qty 2

## 2018-02-02 NOTE — Discharge Instructions (Signed)
I suspect your elevated glucose level is from the steroid injection you received for your bursitis today.  Check your blood glucose levels daily for the next few days - this should trend down, but if it continues to rise, contact your primary doctor who may suggest increasing the frequency of your metformin.  I have prescribed you a medicine you may want to try for tension headache.  Your lab tests and your exam tonight are normal (except for your blood glucose).

## 2018-02-02 NOTE — ED Triage Notes (Signed)
Pt states her cbg's have been running high today and she feels dizzy; pt's last cbg at home was 209

## 2018-02-03 ENCOUNTER — Other Ambulatory Visit: Payer: Self-pay | Admitting: Family Medicine

## 2018-02-03 NOTE — ED Provider Notes (Signed)
[facility] EMERGENCY DEPARTMENT Provider Note   CSN: [id number] Arrival date & time: 02/02/18  2017     History   Chief Complaint Chief Complaint  Patient presents with  . Hyperglycemia    HPI [name] [name] is a 57 y.o. female with a history significant for non insulin dependent diabetes which she states is very well controlled with qod metformin 500 mg, if she takes this medicine more frequently she quite quickly develops hypoglycemia.  She also long  history of tension headaches which have been more frequent in the past few weeks and reports a current mild headache. She usually takes tylenol pm which resolves her symptoms.  Yesterday she received a steroid shot for an inflamed bursa by her orthopedist.  She checked her cbg today secondary to feeling fatigued and vaguely lightheaded and it was elevated at 209.  She is concerned about this elevation.  She denies nausea, vomiting, vision changes, abdominal pain, but does endorse increased thirst and urinary frequency.  She did not take her metformin today, but is scheduled to take it in the morning.   The history is provided by the patient and the spouse.    Past Medical History:  Diagnosis Date  . Allergic rhinitis   . Anemia, unspecified 05/30/2013  . Anxiety   . Breast cancer ([place]) 03/31/13   left  . Cancer ([place])   . Chest pain 2009   Consultation-[place], negative chest CT; nl echo in 2005; h/o palpitations  . Colitis 2010   not IBD  . Degenerative joint disease    + degenerative joint disease of the lumbosacral spine  . Depression   . Diabetes ([place])   . Diabetes ([place])   . Dysrhythmia    palpations  . GERD (gastroesophageal reflux disease)    "a little"  . Headache   . Heart murmur    "small"  . Herpes simplex type II infection   . Hypercholesteremia    "slightly high"  . Hypertension    does not have high blood pressure  . 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 Palpitations   . Personal history of chemotherapy   .  Personal history of radiation therapy   . Wears glasses     Patient Active Problem List   Diagnosis Date Noted  . Rectal bleeding 10/26/2017  . RUQ pain 10/21/2017  . LLQ pain 10/21/2017  . Nausea without vomiting 10/21/2017  . Paresthesias 05/14/2017  . Hyperlipidemia associated with type 2 diabetes mellitus ([place]) 03/22/2017  . Genetic testing 07/01/2016  . Lipoma of back 06/03/2016  . Reactive hypoglycemia 06/03/2016  . Family history of breast cancer in female 05/22/2016  . Diabetes ([place])   . IBS (irritable bowel syndrome) 12/10/2015  . Depression 08/23/2014  . Anxiety 08/23/2014  . Heart murmur 08/23/2014  . Hypercholesteremia 08/23/2014  . History of Chiari malformation 08/23/2014  . 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 Fever   . Vaginal dryness 07/16/2014  . 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 Joint pain 10/02/2013  . Neuropathic pain 10/02/2013  . Onychomycosis of toenail 10/02/2013  . Frequent headaches 06/19/2013  . Anxiety as acute reaction to exceptional stress 05/22/2013  . Malignant neoplasm of upper-outer quadrant of left breast in female, estrogen receptor positive ([place]) 04/06/2013  . 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 Obesity (BMI 30-39.9) 12/14/2012  . GERD (gastroesophageal reflux disease) 12/14/2012  . Chronic depression 12/14/2012  . Heart murmur, systolic 16/57/9038  . Essential hypertension 09/02/2012  . Chest pain   . Palpitations   .  DEGENERATIVE JOINT DISEASE, RIGHT KNEE 10/03/2009  . [place] DEGENERATION 10/24/2007    Past Surgical History:  Procedure Laterality Date  . ABDOMINAL HYSTERECTOMY  03/13/2007   TAH ?BSO--Dr Levin Bacon, [place]  . BREAST EXCISIONAL BIOPSY  04/2003   Left; benign disease  . BREAST LUMPECTOMY Left 09/2013  . BREAST LUMPECTOMY WITH NEEDLE LOCALIZATION AND AXILLARY LYMPH NODE DISSECTION Left 09/12/2013   Procedure: BREAST LUMPECTOMY WITH NEEDLE LOCALIZATION AND AXILLARY LYMPH NODE DISSECTION;  Surgeon: [name], MD;  Location: [facility];  Service: General;  Laterality: Left;  and axilla  . BREAST SURGERY    . COLONOSCOPY  10/2010   proctitis; melanosis coli  . COLONOSCOPY WITH PROPOFOL N/A 01/04/2018   Procedure: COLONOSCOPY WITH PROPOFOL;  Surgeon: Danie Binder, MD;  [facility];  Service: Endoscopy;  Laterality: N/A;  11:00am  . EYE SURGERY Left    "fix lazy eye"  . PORTACATH PLACEMENT Right 04/21/2013   Procedure: INSERTION PORT-A-CATH;  Surgeon: [name], MD;  Location: [facility];  Service: General;  Laterality: Right;  . RIGHT OOPHORECTOMY  03/13/2007  . SUBOCCIPITAL CRANIECTOMY CERVICAL LAMINECTOMY N/A 05/10/2014   Procedure: SUBOCCIPITAL CRANIECTOMY CERVICAL LAMINECTOMY/DURAPLASTY;  Surgeon: [name], MD;  Location: [facility];  Service: Neurosurgery;  Laterality: N/A;  suboccipital craniectomy with cervical laminectomy and duraplasty  . TUBAL LIGATION       OB History    Gravida  4   Para  4   Term  4   Preterm      AB      Living  4     SAB      TAB      Ectopic      Multiple      Live Births  4            Home Medications    Prior to Admission medications   Medication Sig Start Date End Date Taking? Authorizing Provider  acetaminophen (TYLENOL) 500 MG tablet Take 500 mg by mouth every 6 (six) hours as needed for moderate pain.     [provider]  ALPRAZolam Duanne Moron) 1 MG tablet TAKE 1 TABLET BY MOUTH THREE TIMES A DAY AS NEEDED FOR ANXIETY 01/19/18   Mikey Kirschner, MD  atenolol (TENORMIN) 50 MG tablet TAKE 1 TABLET BY MOUTH TWICE A DAY 01/31/18   Mikey Kirschner, MD  atorvastatin (LIPITOR) 20 MG tablet Take 1 tablet (20 mg total) by mouth daily at 6 PM. 08/12/17   Wolfgang Phoenix Grace Bushy, MD  blood glucose meter kit and supplies KIT Dispense based on patient and insurance preference. Use as directed by physician. Diabetes type 2. E11.9 03/03/16   Mikey Kirschner, MD  CONTOUR NEXT TEST test strip TEST ONCE  DAILY AS DIRECTED 02/03/18   Mikey Kirschner, MD  cyclobenzaprine (FLEXERIL) 10 MG tablet Take 1 tablet (10 mg total) by mouth 2 (two) times daily as needed for muscle spasms. 11/15/17   Etta Quill, NP  dicyclomine (BENTYL) 10 MG capsule Take 1 capsule (10 mg total) by mouth 4 (four) times daily -  before meals and at bedtime. Patient not taking: Reported on 12/23/2017 10/21/17   Derrek Monaco A, NP  diphenhydramine-acetaminophen (TYLENOL PM) 25-500 MG TABS Take 2 tablets by mouth at bedtime.    [provider]  EPINEPHrine 0.3 mg/0.3 mL IJ SOAJ injection INJECT 0.3ML INTO MUSCLE ONCE A DAY AS NEEDED FOR ANAPHYLACTIC REACTIONS. 04/07/17  [provider]  escitalopram (LEXAPRO) 20 MG tablet Take 1 tablet (20 mg total) by mouth daily. Patient taking differently: Take 20 mg by mouth every evening.  10/19/17   Mikey Kirschner, MD  isometheptene-acetaminophen-dichloralphenazone (MIDRIN) [phone] MG capsule Take 1 capsule by mouth 4 (four) times daily as needed for migraine. Maximum 5 capsules in 12 hours for migraine headaches, 8 capsules in 24 hours for tension headaches. 02/02/18   Evalee Jefferson, PA-C  metFORMIN (GLUCOPHAGE-XR) 500 MG 24 hr tablet Take 1 tablet (500 mg total) every other day by mouth. 05/14/17   Renato Shin, MD  Multiple Vitamin (MULTIVITAMIN WITH MINERALS) TABS tablet Take 1 tablet by mouth daily.    [provider]  naproxen (NAPROSYN) 375 MG tablet Take 1 tablet (375 mg total) by mouth 2 (two) times daily. Patient not taking: Reported on 12/23/2017 11/15/17   Etta Quill, NP  pantoprazole (PROTONIX) 40 MG tablet Take 1 tablet (40 mg total) by mouth 2 (two) times daily. Patient taking differently: Take 40 mg by mouth daily before breakfast.  07/13/17   Mikey Kirschner, MD  pimecrolimus (ELIDEL) 1 % cream Apply 1 application topically 2 (two) times daily as needed (FOR SKIN BLEMISHES.).    [provider]  Plecanatide (TRULANCE) 3 MG TABS Take 3  mg by mouth daily. Patient not taking: Reported on 12/23/2017 10/26/17   Mahala Menghini, PA-C  promethazine (PHENERGAN) 25 MG tablet Take 1 tablet (25 mg total) by mouth every 6 (six) hours as needed for nausea or vomiting. Patient not taking: Reported on 12/23/2017 10/15/17   Estill Dooms, NP  tamoxifen (NOLVADEX) 10 MG tablet Take 1 tablet (10 mg total) 2 (two) times daily by mouth. 05/19/17   Magrinat, Virgie Dad, MD  valACYclovir (VALTREX) 1000 MG tablet Take 1 tablet (1,000 mg total) by mouth daily. Patient taking differently: Take 1,000 mg by mouth every evening.  09/14/17   Mikey Kirschner, MD  vitamin B-12 (CYANOCOBALAMIN) 1000 MCG tablet Take 1,000 mcg by mouth daily.    [provider]  potassium chloride SA (K-DUR,KLOR-CON) 20 MEQ tablet Take 1 tablet (20 mEq total) by mouth 2 (two) times daily. 07/31/13 08/07/13  Wilmon Arms, MD    Family History Family History  Problem Relation Age of Onset  . Aneurysm Mother        Cerebral  . Hypertension Mother   . Hyperlipidemia Mother   . Stroke Mother   . Coronary artery disease Father   . Hypertension Father   . Hyperlipidemia Father   . Heart attack Father        d. 32  . Lung cancer Father        dx early 18s; former smoker  . Lung cancer Brother   . Lung cancer Brother        d. 29y; smoker  . Lung cancer Sister        paternal half-sister; not a smoker; dx <60; d. 65y  . Leukemia Maternal Aunt        "blood cancer"; d. unspecified age  . Cervical cancer Paternal Aunt        d. late 39s  . Breast cancer Sister 8  . Other Sister        hx of hysterectomy for unspecified reason  . Multiple myeloma Brother        d. 23s  . Lung cancer Brother        dx late 66s; smoker  . Prostate  cancer Brother        dx late 48s  . Prostate cancer Brother        dx late 80s  . Lung cancer Brother   . Lung cancer Maternal Aunt        d. unspecified age  . Breast cancer Maternal Aunt        dx unspecified age  .  Prostate cancer Cousin        maternal 1st cousin; dx older age  . Breast cancer Other        niece dx early 25s or before  . Leukemia Other        nephew d. 2y; "blood cancer"  . Colon cancer Neg Hx   . Diabetes Neg Hx     Social History Social History   Tobacco Use  . Smoking status: Former Smoker    Packs/day: 0.50    Years: 15.00    Pack years: 7.50    Types: Cigarettes    Last attempt to quit: 07/06/1982    Years since quitting: 35.6  . Smokeless tobacco: Never Used  Substance Use Topics  . Alcohol use: No  . Drug use: No     Allergies   Doxycycline   Review of Systems Review of Systems  Constitutional: Negative for chills and fever.  HENT: Negative.  Negative for congestion, sinus pressure and sinus pain.   Eyes: Negative.  Negative for visual disturbance.  Respiratory: Negative for chest tightness and shortness of breath.   Cardiovascular: Negative for chest pain.  Gastrointestinal: Negative for abdominal pain, nausea and vomiting.  Endocrine: Positive for polydipsia and polyuria.  Genitourinary: Negative.  Negative for dysuria.  Musculoskeletal: Negative for arthralgias, joint swelling and neck pain.  Skin: Negative.  Negative for rash and wound.  Neurological: Positive for weakness and headaches. Negative for dizziness, light-headedness and numbness.  Psychiatric/Behavioral: Negative.      Physical Exam Updated Vital Signs BP 102/63   Pulse 61   Temp 98.4 F (36.9 C) (Oral)   Resp 18   Ht '5\' 1"'  (1.549 m)   Wt 68.5 kg (151 lb)   LMP 12/04/2008   SpO2 100%   BMI 28.53 kg/m   Physical Exam  Constitutional: She is oriented to person, place, and time. She appears well-developed and well-nourished.  HENT:  Head: Normocephalic and atraumatic.  Mouth/Throat: Oropharynx is clear and moist and mucous membranes are normal.  Eyes: Pupils are equal, round, and reactive to light. Conjunctivae and EOM are normal.  Neck: Normal range of motion. Neck supple.   Cardiovascular: Normal rate, regular rhythm, normal heart sounds and intact distal pulses.  Pulmonary/Chest: Effort normal and breath sounds normal. She has no wheezes.  Abdominal: Soft. Bowel sounds are normal. There is no tenderness.  Musculoskeletal: Normal range of motion.  Lymphadenopathy:    She has no cervical adenopathy.  Neurological: She is alert and oriented to person, place, and time. She has normal strength. No sensory deficit. Gait normal. GCS eye subscore is 4. GCS verbal subscore is 5. GCS motor subscore is 6.  Normal heel-shin, normal rapid alternating movements. Cranial nerves III-XII intact.  No pronator drift.  Skin: Skin is warm and dry. No rash noted.  Psychiatric: She has a normal mood and affect. Her speech is normal and behavior is normal. Thought content normal. Cognition and memory are normal.  Nursing note and vitals reviewed.    ED Treatments / Results  Labs (all labs ordered are listed, but only  abnormal results are displayed) Labs Reviewed  CBG MONITORING, ED - Abnormal; Notable for the following components:      Result Value   Glucose-Capillary 213 (*)    All other components within normal limits  I-STAT CHEM 8, ED - Abnormal; Notable for the following components:   Glucose, Bld 216 (*)    All other components within normal limits    EKG None  Radiology No results found.  Procedures Procedures (including critical care time)  Medications Ordered in ED Medications  acetaminophen (TYLENOL) tablet 1,000 mg (1,000 mg Oral Given 02/02/18 2255)  diphenhydrAMINE (BENADRYL) capsule 25 mg (25 mg Oral Given 02/02/18 2256)     Initial Impression / Assessment and Plan / ED Course  I have reviewed the triage vital signs and the nursing notes.  Pertinent labs & imaging results that were available during my care of the patient were reviewed by me and considered in my medical decision making (see chart for details).     Labs reviewed with hyperglycemia  but no signs/sx of dka or elevated anion gap.  Suspect cbg elevation secondary to steroid injection.  Discussed taking metformin daily for the next few days with closer watch on cbg's as number hopefully trends downward.  She was resistent to this suggestion due to her sensitivity to this medicine. Advised her that her cbg is not dangerous, although not ideal and she should simply closely watch diet and more frequent checks of cbgs and to f/u with her pcp if this does not normalize over the next several days.    Pt has a current mild headache similar to her chronic tension headaches. She was given tylenol with benadryl for this prior to dc home.  The patient appears reasonably screened and/or stabilized for discharge and I doubt any other medical condition or other [facility] requiring further screening, evaluation, or treatment in the ED at this time prior to discharge.   Final Clinical Impressions(s) / ED Diagnoses   Final diagnoses:  Hyperglycemia  Tension headache    ED Discharge Orders        Ordered    isometheptene-acetaminophen-dichloralphenazone (MIDRIN) 65-100-325 MG capsule  4 times daily PRN     02/02/18 2349       Evalee Jefferson, PA-C 02/03/18 1946    Davonna Belling, MD 02/09/18 [phone]

## 2018-02-04 ENCOUNTER — Ambulatory Visit (INDEPENDENT_AMBULATORY_CARE_PROVIDER_SITE_OTHER): Payer: Medicare HMO | Admitting: Family Medicine

## 2018-02-04 ENCOUNTER — Telehealth: Payer: Self-pay | Admitting: Family Medicine

## 2018-02-04 ENCOUNTER — Encounter: Payer: Self-pay | Admitting: Family Medicine

## 2018-02-04 VITALS — BP 118/82 | Ht 61.0 in | Wt 154.0 lb

## 2018-02-04 DIAGNOSIS — R51 Headache: Secondary | ICD-10-CM | POA: Diagnosis not present

## 2018-02-04 DIAGNOSIS — E119 Type 2 diabetes mellitus without complications: Secondary | ICD-10-CM

## 2018-02-04 DIAGNOSIS — R519 Headache, unspecified: Secondary | ICD-10-CM

## 2018-02-04 LAB — POCT GLUCOSE (DEVICE FOR HOME USE): Glucose Fasting, POC: 183 mg/dL — AB (ref 70–99)

## 2018-02-04 LAB — POCT GLYCOSYLATED HEMOGLOBIN (HGB A1C): Hemoglobin A1C: 6 % — AB (ref 4.0–5.6)

## 2018-02-04 NOTE — Telephone Encounter (Signed)
[facility] [place] and pt aware

## 2018-02-04 NOTE — Telephone Encounter (Signed)
Contour Next Test Strips were on patients med list and that was sent in yesterday.

## 2018-02-04 NOTE — Telephone Encounter (Signed)
Script printed and awaiting signature. 

## 2018-02-04 NOTE — Telephone Encounter (Signed)
Please go ahead with a prescription for a new meter and strips and supplies she is a type II diabetic recommended to check sugar no more than once daily, may have PRN refills

## 2018-02-04 NOTE — Telephone Encounter (Signed)
Patient called saying test strips that were called in yesterday, insurance will not pay for unless she gets a new meter. She now wants a rx sent in for a new meter and supplies.  [facility] [place].  (Patient was seen earlier today so I am not sure if she mentioned this to Dr. Nicki Reaper??)

## 2018-02-04 NOTE — Progress Notes (Addendum)
Subjective:    Patient ID: [name] [name], female    DOB: 03/02/1961, 57 y.o.   MRN: [id number]  HPI  Patient is here today for a ed follow up. She went to [place] on this past Wednesday.She states she had a really bad headache and her bs kept rising. She is not on any insulin,but on metformin 500 mg one po QOD.She states her head still hurts.  This patient states that she has intermittent times where her blood sugar runs low She also relates that at times she finds herself being stressed She does try to watch her diet Unfortunately she does not get along with metformin well She is recently had a steroid injection which is caused her sugars to be elevated She also is found herself having mild to moderate headache over the past several days She denies blurred vision In addition to this she denies vomiting or diarrhea She has a history of breast cancer approximately 5 years ago She also has a history of a Chiari malformation with brain surgery several years ago as well Patient also had frequent headaches over the past couple weeks constant headache she has had a history of headaches in the past but this is different.  She relates it is significant pressure pain discomfort.  She denies blurred vision or vomiting with it. Review of Systems  Constitutional: Negative for activity change, appetite change and fatigue.  HENT: Negative for congestion and rhinorrhea.   Respiratory: Negative for cough and shortness of breath.   Cardiovascular: Negative for chest pain and leg swelling.  Gastrointestinal: Negative for abdominal pain and diarrhea.  Endocrine: Negative for polydipsia and polyphagia.  Skin: Negative for color change.  Neurological: Negative for dizziness and weakness.  Psychiatric/Behavioral: Negative for behavioral problems and confusion.   Results for orders placed or performed in visit on 02/04/18  POCT glycosylated hemoglobin (Hb A1C)  Result Value Ref Range   Hemoglobin  A1C 6.0 (A) 4.0 - 5.6 %   HbA1c POC (<> result, manual entry)  4.0 - 5.6 %   HbA1c, POC (prediabetic range)  5.7 - 6.4 %   HbA1c, POC (controlled diabetic range)  0.0 - 7.0 %  POCT Glucose (Device for Home Use)  Result Value Ref Range   Glucose Fasting, POC 183 (A) 70 - 99 mg/dL   POC Glucose  70 - 99 mg/dl       Objective:   Physical Exam  Constitutional: She appears well-nourished. No distress.  HENT:  Head: Normocephalic and atraumatic.  Eyes: Right eye exhibits no discharge. Left eye exhibits no discharge.  Neck: No tracheal deviation present.  Cardiovascular: Normal rate, regular rhythm and normal heart sounds.  No murmur heard. Pulmonary/Chest: Effort normal and breath sounds normal. No respiratory distress.  Musculoskeletal: She exhibits no edema.  Lymphadenopathy:    She has no cervical adenopathy.  Neurological: She is alert. Coordination normal.  Skin: Skin is warm and dry.  Psychiatric: She has a normal mood and affect. Her behavior is normal.  Vitals reviewed. Finger-to-nose normal peripheral vision normal optic disks normal EOMI normal no neurologic deficits noted   25 minutes was spent with the patient.  This statement verifies that 25 minutes was indeed spent with the patient.  More than 50% of this visit-total duration of the visit-was spent in counseling and coordination of care. The issues that the patient came in for today as reflected in the diagnosis (s) please refer to documentation for further details.  Assessment & Plan:  Diabetes-it seems that she is having just as much trouble from the metformin as she is by not taking the Metformin she states when she takes it every day she has low sugar spells Given that her A1c is 6 I think if she does a good job with activity and watching her diet she can probably keep it under good control Therefore stop the metformin See if she feels better See if she avoids low sugars She will check her sugars once or  twice daily on a regular basis over the next several weeks and send [place] an update regarding this  Moderate headaches that has been present for 2 weeks ongoing but does not let up and is present when she wakes up in the morning because of her health history of breast cancer 5 years ago that is still being treated plus also health history of previous brain surgery this patient needs a MRI of the brain await the results of this may need neurology consult she has a remote history of tension headaches that she saw specialist for but I doubt that these are tension headaches  We will refer to diabetic educator If she has ongoing sugar issues especially if they become worse she may need to consider a different diabetic medication potentially Januvia or something similar  Follow-up with Dr. Richardson Landry in several weeks

## 2018-02-07 ENCOUNTER — Telehealth: Payer: Self-pay | Admitting: Family Medicine

## 2018-02-07 ENCOUNTER — Other Ambulatory Visit: Payer: Self-pay | Admitting: *Deleted

## 2018-02-07 MED ORDER — BLOOD GLUCOSE METER KIT: PACK | 0 refills | Status: DC

## 2018-02-07 NOTE — Addendum Note (Signed)
Addended by: Dairl Ponder on: 02/07/2018 10:05 AM   Modules accepted: Orders

## 2018-02-07 NOTE — Progress Notes (Signed)
Referral ordered in Epic. 

## 2018-02-07 NOTE — Telephone Encounter (Signed)
Pt calling to request a one touch meter kit and test strips be called in. The Contour that was called in her ins wont cover. [id number] [place] [place]

## 2018-02-08 DIAGNOSIS — R69 Illness, unspecified: Secondary | ICD-10-CM | POA: Diagnosis not present

## 2018-02-08 NOTE — Telephone Encounter (Signed)
Pt.notified

## 2018-02-08 NOTE — Telephone Encounter (Signed)
rx faxed. Left message to return call to notify pt.

## 2018-02-16 ENCOUNTER — Other Ambulatory Visit: Payer: Self-pay | Admitting: Family Medicine

## 2018-02-24 ENCOUNTER — Ambulatory Visit: Payer: Medicare Other | Admitting: Family Medicine

## 2018-02-25 ENCOUNTER — Ambulatory Visit (HOSPITAL_COMMUNITY): Payer: Medicare HMO

## 2018-02-28 ENCOUNTER — Ambulatory Visit (HOSPITAL_COMMUNITY)
Admission: RE | Admit: 2018-02-28 | Discharge: 2018-02-28 | Disposition: A | Discharge status: Home / Self Care | Payer: Medicare HMO | Source: Ambulatory Visit | Attending: Family Medicine | Admitting: Family Medicine

## 2018-02-28 DIAGNOSIS — R519 Headache, unspecified: Secondary | ICD-10-CM

## 2018-02-28 DIAGNOSIS — Z87798 Personal history of other (corrected) congenital malformations: Secondary | ICD-10-CM | POA: Diagnosis not present

## 2018-02-28 DIAGNOSIS — R51 Headache: Secondary | ICD-10-CM | POA: Insufficient documentation

## 2018-03-03 ENCOUNTER — Ambulatory Visit: Payer: Medicare Other | Admitting: Family Medicine

## 2018-03-14 DIAGNOSIS — J301 Allergic rhinitis due to pollen: Secondary | ICD-10-CM | POA: Diagnosis not present

## 2018-03-14 DIAGNOSIS — T783XXA Angioneurotic edema, initial encounter: Secondary | ICD-10-CM | POA: Diagnosis not present

## 2018-03-14 DIAGNOSIS — J3089 Other allergic rhinitis: Secondary | ICD-10-CM | POA: Diagnosis not present

## 2018-03-14 DIAGNOSIS — J3081 Allergic rhinitis due to animal (cat) (dog) hair and dander: Secondary | ICD-10-CM | POA: Diagnosis not present

## 2018-03-18 ENCOUNTER — Ambulatory Visit (INDEPENDENT_AMBULATORY_CARE_PROVIDER_SITE_OTHER): Payer: Medicare HMO | Admitting: Family Medicine

## 2018-03-18 ENCOUNTER — Encounter: Payer: Self-pay | Admitting: Family Medicine

## 2018-03-18 VITALS — BP 118/78 | Ht 61.0 in | Wt 155.4 lb

## 2018-03-18 DIAGNOSIS — E785 Hyperlipidemia, unspecified: Secondary | ICD-10-CM

## 2018-03-18 DIAGNOSIS — F329 Major depressive disorder, single episode, unspecified: Secondary | ICD-10-CM

## 2018-03-18 DIAGNOSIS — I10 Essential (primary) hypertension: Secondary | ICD-10-CM

## 2018-03-18 DIAGNOSIS — E1169 Type 2 diabetes mellitus with other specified complication: Secondary | ICD-10-CM | POA: Diagnosis not present

## 2018-03-18 DIAGNOSIS — E119 Type 2 diabetes mellitus without complications: Secondary | ICD-10-CM | POA: Diagnosis not present

## 2018-03-18 DIAGNOSIS — F32A Depression, unspecified: Secondary | ICD-10-CM

## 2018-03-18 DIAGNOSIS — R69 Illness, unspecified: Secondary | ICD-10-CM | POA: Diagnosis not present

## 2018-03-18 MED ORDER — VALACYCLOVIR HCL 1 G PO TABS: 1000.0000 mg | ORAL_TABLET | Freq: Every day | ORAL | 5 refills | Status: DC

## 2018-03-18 MED ORDER — ALPRAZOLAM 1 MG PO TABS: ORAL_TABLET | ORAL | 5 refills | Status: DC

## 2018-03-18 MED ORDER — ATORVASTATIN CALCIUM 20 MG PO TABS: 20.0000 mg | ORAL_TABLET | Freq: Every day | ORAL | 5 refills | Status: DC

## 2018-03-18 NOTE — Progress Notes (Signed)
   Subjective:    Patient ID: [name] [name], female    DOB: 1960-07-20, 57 y.o.   MRN: [id number] Patient arrives with numerous concerns HPI  Patient arrives for a 3 week follow up on diabetes. Patient had to stop Metformin due to complications and is currently not on medication for sugar.  Patient states her sugars running 140-150 fasting currently.   Patient continues to take lipid medication regularly. No obvious side effects from it. Generally does not miss a dose. Prior blood work results are reviewed with patient. Patient continues to work on fat intake in diet  Patient claims compliance with diabetes medication. No obvious side effects. Reports no substantial low sugar spells. Most numbers are generally in good range when checked fasting. Generally does not miss a dose of medication. Watching diabetic diet closely   Pt chronicaly s disable form neck and hip pain   On further history.  Patient thinks the metformin was causing headaches.  Unfortunately the patient notes she eats only one meal a day.  Curiously she notes if she eats more than 1 meal per day causes her sugar to drop sometimes substantial   Patietn would also like refills of alprazolam, valtrex and lipitor Review of Systems No headache, no major weight loss or weight gain, no chest pain no back pain abdominal pain no change in bowel habits complete ROS otherwise negative     Objective:   Physical Exam  Alert and oriented, vitals reviewed and stable, NAD ENT-TM's and ext canals WNL bilat via otoscopic exam Soft palate, tonsils and post pharynx WNL via oropharyngeal exam Neck-symmetric, no masses; thyroid nonpalpable and nontender Pulmonary-no tachypnea or accessory muscle use; Clear without wheezes via auscultation Card--no abnrml murmurs, rhythm reg and rate WNL Carotid pulses symmetric, without bruits     Assessment & Plan:  Impression type 2 diabetes.  Challenging with patient's once per day eating  habits.  This needs to change rationale discussed.  Hold off metformin due to self perceived side effects  2.  Hyperlipidemia levels reviewed to maintain same  3.  Depression with element of anxiety.  Clinically stable per patient.  Wishes to maintain meds medications refilled  4.  Chronic headaches.  5.  Chronic neuropathic shoulder and leg pain.  Followed by specialist with intermittent injections  Follow-up in several months.  A1c at that time further recommendations based on results

## 2018-03-19 LAB — HEPATIC FUNCTION PANEL
ALT: 14 IU/L (ref 0–32)
AST: 15 IU/L (ref 0–40)
Albumin: 4.1 g/dL (ref 3.5–5.5)
Alkaline Phosphatase: 47 IU/L (ref 39–117)
Bilirubin Total: 0.3 mg/dL (ref 0.0–1.2)
Bilirubin, Direct: 0.12 mg/dL (ref 0.00–0.40)
Total Protein: 6.5 g/dL (ref 6.0–8.5)

## 2018-03-19 LAB — BASIC METABOLIC PANEL
BUN/Creatinine Ratio: 12 (ref 9–23)
BUN: 12 mg/dL (ref 6–24)
CO2: 29 mmol/L (ref 20–29)
Calcium: 9.1 mg/dL (ref 8.7–10.2)
Chloride: 103 mmol/L (ref 96–106)
Creatinine, Ser: 1.01 mg/dL — ABNORMAL HIGH (ref 0.57–1.00)
GFR calc Af Amer: 72 mL/min/{1.73_m2} (ref >59)
GFR calc non Af Amer: 62 mL/min/{1.73_m2} (ref >59)
Glucose: 85 mg/dL (ref 65–99)
Potassium: 4 mmol/L (ref 3.5–5.2)
Sodium: 145 mmol/L — ABNORMAL HIGH (ref 134–144)

## 2018-03-19 LAB — LIPID PANEL
Chol/HDL Ratio: 2.3 ratio (ref 0.0–4.4)
Cholesterol, Total: 169 mg/dL (ref 100–199)
HDL: 72 mg/dL (ref >39)
LDL Calculated: 83 mg/dL (ref 0–99)
Triglycerides: 71 mg/dL (ref 0–149)
VLDL Cholesterol Cal: 14 mg/dL (ref 5–40)

## 2018-03-20 ENCOUNTER — Encounter: Payer: Self-pay | Admitting: Family Medicine

## 2018-03-25 ENCOUNTER — Other Ambulatory Visit: Payer: Self-pay | Admitting: Family Medicine

## 2018-03-25 DIAGNOSIS — R69 Illness, unspecified: Secondary | ICD-10-CM | POA: Diagnosis not present

## 2018-03-29 ENCOUNTER — Encounter: Payer: Medicare HMO | Attending: Family Medicine | Admitting: Nutrition

## 2018-03-29 ENCOUNTER — Encounter: Payer: Self-pay | Admitting: Nutrition

## 2018-03-29 VITALS — Ht 61.0 in | Wt 153.0 lb

## 2018-03-29 DIAGNOSIS — Z6828 Body mass index (BMI) 28.0-28.9, adult: Secondary | ICD-10-CM | POA: Diagnosis not present

## 2018-03-29 DIAGNOSIS — E119 Type 2 diabetes mellitus without complications: Secondary | ICD-10-CM | POA: Diagnosis not present

## 2018-03-29 DIAGNOSIS — Z713 Dietary counseling and surveillance: Secondary | ICD-10-CM | POA: Insufficient documentation

## 2018-03-29 NOTE — Patient Instructions (Addendum)
Goals 1. Follow The Plate Method 2 Eat meals on time 3. Increase fresh fruits and vegetables

## 2018-03-29 NOTE — Progress Notes (Signed)
  Medical Nutrition Therapy:  Appt start time: 1100 end time:  1200.   Assessment:  Primary concerns today: Diabets DM. Live with her husband and daughter and 2 foster kids.  A1C 6%. Had an injection in her hip.. BS up due to steriod shots.   Preferred Learning Style:   No preference indicated   Learning Readiness:   Ready  Change in progress   MEDICATIONS:   DIETARY INTAKE:   24-hr recall:  B ( AM):  skips  Snk ( AM):   L ( PM): Corn flakes or honey nut cherrios Snk ( PM):  D ( PM): Spaghetti, 3/4 c,  Snk ( PM): doritos  Beverages: coffee and wate  Usual physical activity: ADL  Estimated energy needs: 1500  calories 170 g carbohydrates 112 g protein 42 g fat  Progress Towards Goal(s):  In progress.   Nutritional Diagnosis:  NB-1.1 Food and nutrition-related knowledge deficit As related to Diabetes.  As evidenced by A1C 6.% with BS >  200 at times.     Intervention:  Nutrition and Diabetes education provided on My Plate, CHO counting, meal planning, portion sizes, timing of meals, avoiding snacks between meals unless having a low blood sugar, target ranges for A1C and blood sugars, signs/symptoms and treatment of hyper/hypoglycemia, monitoring blood sugars, taking medications as prescribed, benefits of exercising 30 minutes per day and prevention of complications of DM. Marland Kitchen Goals 1. Follow The Plate Method 2 Eat meals on time 3. Increase fresh fruits and vegetables  Teaching Method Utilized:  Visual Auditory Hands on  Handouts given during visit include:  The Plate Method  Meal Plan    Barriers to learning/adherence to lifestyle change:none   Demonstrated degree of understanding via:  Teach Back   Monitoring/Evaluation:  Dietary intake, exercise, meal pannign , and body weight in 3 month(s).

## 2018-05-16 DIAGNOSIS — M79675 Pain in left toe(s): Secondary | ICD-10-CM | POA: Diagnosis not present

## 2018-05-16 DIAGNOSIS — L600 Ingrowing nail: Secondary | ICD-10-CM | POA: Diagnosis not present

## 2018-05-19 ENCOUNTER — Inpatient Hospital Stay: Payer: Medicare HMO

## 2018-05-19 ENCOUNTER — Inpatient Hospital Stay: Payer: Medicare HMO | Attending: Oncology | Admitting: Oncology

## 2018-05-19 VITALS — BP 101/52 | HR 62 | Temp 98.6°F | Resp 20 | Ht 61.0 in | Wt 156.5 lb

## 2018-05-19 DIAGNOSIS — M8580 Other specified disorders of bone density and structure, unspecified site: Secondary | ICD-10-CM

## 2018-05-19 DIAGNOSIS — Z87891 Personal history of nicotine dependence: Secondary | ICD-10-CM | POA: Diagnosis not present

## 2018-05-19 DIAGNOSIS — Z853 Personal history of malignant neoplasm of breast: Secondary | ICD-10-CM | POA: Diagnosis not present

## 2018-05-19 DIAGNOSIS — Z170 Estrogen receptor positive status [ER+]: Secondary | ICD-10-CM

## 2018-05-19 DIAGNOSIS — C50412 Malignant neoplasm of upper-outer quadrant of left female breast: Secondary | ICD-10-CM

## 2018-05-19 LAB — COMPREHENSIVE METABOLIC PANEL
ALT: 11 U/L (ref 0–44)
AST: 13 U/L — ABNORMAL LOW (ref 15–41)
Albumin: 3.5 g/dL (ref 3.5–5.0)
Alkaline Phosphatase: 54 U/L (ref 38–126)
Anion gap: 8 (ref 5–15)
BUN: 12 mg/dL (ref 6–20)
CO2: 28 mmol/L (ref 22–32)
Calcium: 9.2 mg/dL (ref 8.9–10.3)
Chloride: 107 mmol/L (ref 98–111)
Creatinine, Ser: 1.1 mg/dL — ABNORMAL HIGH (ref 0.44–1.00)
GFR calc Af Amer: >60 mL/min (ref >60)
GFR calc non Af Amer: 55 mL/min — ABNORMAL LOW (ref >60)
Glucose, Bld: 99 mg/dL (ref 70–99)
Potassium: 3.7 mmol/L (ref 3.5–5.1)
Sodium: 143 mmol/L (ref 135–145)
Total Bilirubin: 0.4 mg/dL (ref 0.3–1.2)
Total Protein: 6.9 g/dL (ref 6.5–8.1)

## 2018-05-19 LAB — CBC WITH DIFFERENTIAL/PLATELET
Abs Immature Granulocytes: 0.02 10*3/uL (ref 0.00–0.07)
Basophils Absolute: 0 10*3/uL (ref 0.0–0.1)
Basophils Relative: 0 %
Eosinophils Absolute: 0.1 10*3/uL (ref 0.0–0.5)
Eosinophils Relative: 1 %
HCT: 35.8 % — ABNORMAL LOW (ref 36.0–46.0)
Hemoglobin: 11.8 g/dL — ABNORMAL LOW (ref 12.0–15.0)
Immature Granulocytes: 0 %
Lymphocytes Relative: 28 %
Lymphs Abs: 2.4 10*3/uL (ref 0.7–4.0)
MCH: 28.1 pg (ref 26.0–34.0)
MCHC: 33 g/dL (ref 30.0–36.0)
MCV: 85.2 fL (ref 80.0–100.0)
Monocytes Absolute: 0.6 10*3/uL (ref 0.1–1.0)
Monocytes Relative: 7 %
Neutro Abs: 5.5 10*3/uL (ref 1.7–7.7)
Neutrophils Relative %: 64 %
Platelets: 177 10*3/uL (ref 150–400)
RBC: 4.2 MIL/uL (ref 3.87–5.11)
RDW: 13.6 % (ref 11.5–15.5)
WBC: 8.6 10*3/uL (ref 4.0–10.5)
nRBC: 0 % (ref 0.0–0.2)

## 2018-05-19 NOTE — Progress Notes (Signed)
[facility]  [phone] [phone]    ID: [name] OB: 09-22-1960  MR#: [id number]  ZDG#:[id number]  Patient Care Team: Mikey Kirschner, MD as PCP - General (Family Medicine) Lattie Haw, Cristopher Estimable, MD as Attending Physician (Cardiology) Rogene Houston, MD as Consulting Physician (Gastroenterology) Yisroel Ramming, Everardo All, MD as Consulting Physician (Obstetrics and Gynecology) Magrinat, Virgie Dad, MD as Consulting Physician (Oncology) Thea Silversmith, MD as Consulting Physician (Radiation Oncology) Alphonsa Overall, MD as Consulting Physician (General Surgery) Michael Boston, MD as Consulting Physician (General Surgery) Danie Binder, MD as Consulting Physician (Gastroenterology) Michel Santee, MD as Consulting Physician (Neurology) Jonnie Kind, MD as Consulting Physician (Obstetrics and Gynecology) Nicholes Stairs, MD as Consulting Physician (Orthopedic Surgery) Nunzio Cobbs, MD as Consulting Physician (Obstetrics and Gynecology) Nilda Simmer, NP (Family Medicine) Latanya Maudlin, MD as Consulting Physician (Orthopedic Surgery) OTHER MD:   CHIEF COMPLAINT:  Left Breast Cancer  CURRENT TREATMENT: Completing 5 years of tamoxifen  BREAST CANCER HISTORY: From the original intake note 04/12/2013:  Lasheena palpated a mass in her left breast in late August 2014. She was already scheduled for screening mammography at [facility] and this was performed 03/13/2013. Indeed a possible mass was noted in the left breast and on 03/29/2013 the patient underwent diagnostic left mammography and left ultrasonography. This showed a 2.5 cm irregular mass in the upper outer quadrant of the left breast. This was palpable.by ultrasound there was a 1.6 cm irregular hypoechoic mass in the area in question, and in addition to enlarged left axillary lymph nodes were noted, the largest measuring 2.5 cm.  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  Bilateral breast MRI 04/11/2013 shows 3 abnormal enhancing masses in the upper outer quadrant of the left breast measuring altogether 6.4 cm. The largest separate mass measured 2.7 cm. There were abnormal or large lymph nodes in the left axilla. Ultrasound guided biopsy of the 2 smaller masses has been scheduled.  The patient's subsequent history is as detailed below   INTERVAL HISTORY: Lareen returns today for follow-up and treatment of her estrogen receptor positive breast cancer.   The patient continues on tamoxifen.  She tolerates this well, with no significant hot flashes or vaginal wetness problems.    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 The children she was fostering are now with another family member.  The experience was difficult for her because the oldest child who was almost 4 was still in diapers and in general of the children had behavioral issues.  She still experiences headaches that last about 24 hours, adding nothing seems to help other then waiting it out.  Overall the pattern is unchanged from the last several years.  The patient denies unusual visual changes, nausea, vomiting, or dizziness. There has been no unusual cough, phlegm production, or pleurisy. This been no change in bowel or bladder habits. The patient denies unexplained fatigue or unexplained weight loss, bleeding, rash, or fever. A detailed review of systems was otherwise noncontributory.  PAST MEDICAL HISTORY: Past Medical History:  Diagnosis Date  . Allergic  rhinitis   . Anemia, unspecified 05/30/2013  . Anxiety   . Breast cancer ([place]) 03/31/13   left  . Cancer ([place])   . Chest pain 2009   Consultation-[facility], negative chest CT; nl echo in 2005; h/o palpitations  . Colitis 2010   not IBD  . Degenerative joint disease    + degenerative joint disease of the lumbosacral spine  . Depression   . Diabetes ([place])   . Diabetes ([place])   . Dysrhythmia    palpations  . GERD (gastroesophageal reflux disease)    "a little"  . Headache   . Heart murmur    "small"  . Herpes simplex type II infection   . Hypercholesteremia    "slightly high"  . Hypertension    does not have high blood pressure  . 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 Palpitations   . Personal history of chemotherapy   . Personal history of radiation therapy   . Wears glasses     PAST SURGICAL HISTORY: Past Surgical History:  Procedure Laterality Date  . ABDOMINAL HYSTERECTOMY  03/13/2007   TAH ?BSO--Dr Levin Bacon, [place]  . BREAST EXCISIONAL BIOPSY  04/2003   Left; benign disease  . BREAST LUMPECTOMY Left 09/2013  . BREAST LUMPECTOMY WITH NEEDLE LOCALIZATION AND AXILLARY LYMPH NODE DISSECTION Left 09/12/2013   Procedure: BREAST LUMPECTOMY WITH NEEDLE LOCALIZATION AND AXILLARY LYMPH NODE DISSECTION;  Surgeon: [name], MD;  Location: [facility];  Service: General;  Laterality: Left;  and axilla  . BREAST SURGERY    . COLONOSCOPY  10/2010   proctitis; melanosis coli  . COLONOSCOPY WITH PROPOFOL N/A 01/04/2018   Procedure: COLONOSCOPY WITH PROPOFOL;  Surgeon: Danie Binder, MD;  [facility];  Service: Endoscopy;  Laterality: N/A;  11:00am  . EYE SURGERY Left    "fix lazy eye"  . PORTACATH PLACEMENT Right 04/21/2013   Procedure: INSERTION PORT-A-CATH;  Surgeon: [name], MD;  Location: [facility];  Service: General;  Laterality: Right;  . RIGHT OOPHORECTOMY  03/13/2007  . SUBOCCIPITAL CRANIECTOMY CERVICAL LAMINECTOMY N/A 05/10/2014   Procedure:  SUBOCCIPITAL CRANIECTOMY CERVICAL LAMINECTOMY/DURAPLASTY;  Surgeon: [name], MD;  Location: [facility];  Service: Neurosurgery;  Laterality: N/A;  suboccipital craniectomy with cervical laminectomy and duraplasty  . TUBAL LIGATION      FAMILY HISTORY Family History  Problem Relation Age of Onset  . Aneurysm Mother        Cerebral  . Hypertension Mother   . Hyperlipidemia Mother   . Stroke Mother   . Coronary artery disease Father   . Hypertension Father   . Hyperlipidemia Father   . Heart attack Father        d. 30  . Lung cancer Father        dx early 67s; former smoker  . Lung cancer Brother   . Lung cancer Brother        d. 52y; smoker  . Lung cancer Sister        paternal half-sister; not a smoker; dx <60; d. 24y  . Leukemia Maternal Aunt        "blood cancer"; d. unspecified age  . Cervical cancer Paternal Aunt        d. late 27s  . Breast cancer Sister 32  . Other Sister        hx of hysterectomy for unspecified reason  . Multiple myeloma  Brother        d. 62s  . Lung cancer Brother        dx late 50s; smoker  . Prostate cancer Brother        dx late 64s  . Prostate cancer Brother        dx late 13s  . Lung cancer Brother   . Lung cancer Maternal Aunt        d. unspecified age  . Breast cancer Maternal Aunt        dx unspecified age  . Prostate cancer Cousin        maternal 1st cousin; dx older age  . Breast cancer Other        niece dx early 89s or before  . Leukemia Other        nephew d. 2y; "blood cancer"  . Colon cancer Neg Hx   . Diabetes Neg Hx   The patient's father died at the age of 86 from a myocardial infarction. The patient's mother died at the age of 77 from a stroke. The patient had 6 brothers, 2 sisters. One brother has a history of lung cancer in the setting of tobacco abuse. Another brother had multiple myeloma. One sister was diagnosed with breast cancer at the age of 87   GYNECOLOGIC HISTORY: (Updated 09/25/2013) Menarche  age 89, first live birth age 28, the patient is [place] P4. She status post abdominal hysterectomy with bilateral salpingo-oophorectomy. She never took hormone replacement.   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  Her husband [name] works in Charity fundraiser. Daughter [name] lives at home with the patient with 1 of her 3 children. Son Savanna Dooley lives in [place] and is not employed. Daughter Maggie Schwalbe Dillard lives in [place] and is a Barrister's clerk. Daughter Ernst Spell is a Education officer, museum in [place].  The patient has 10 grandchildren. She attends [employer]     ADVANCED DIRECTIVES: Not in place   HEALTH MAINTENANCE:  (Updated 09/25/2013)  Social History   Tobacco Use  . Smoking status: Former Smoker    Packs/day: 0.50    Years: 15.00    Pack years: 7.50    Types: Cigarettes    Last attempt to quit: 07/06/1982    Years since quitting: 35.8  . Smokeless tobacco: Never Used  Substance Use Topics  . Alcohol use: No  . Drug use: No     Colonoscopy:2012  PAP:2014   Bone density: Never  Lipid panel: Not on file    Allergies  Allergen Reactions  . Metformin And Related   . Doxycycline Itching and Swelling    Current Outpatient Medications  Medication Sig Dispense Refill  . acetaminophen (TYLENOL) 500 MG tablet Take 500 mg by mouth every 6 (six) hours as needed for moderate pain.     Marland Kitchen ALPRAZolam (XANAX) 1 MG tablet TAKE 1 TABLET BY MOUTH THREE TIMES A DAY AS NEEDED FOR ANXIETY 60 tablet 5  . atenolol (TENORMIN) 50 MG tablet TAKE 1 TABLET BY MOUTH TWICE A DAY 180 tablet 1  . atorvastatin (LIPITOR) 20 MG tablet Take 1 tablet (20 mg total) by mouth daily at 6 PM. 30 tablet 5  . blood glucose meter kit and supplies KIT Dispense based on patient and insurance preference. Use as directed by physician. Diabetes type 2. E11.9 1 each 0  . blood  glucose  meter kit and supplies Dispense based on patient and insurance preference. Use up to test glucose once daily. For ICD -10 E11.9. 1 each 0  . diphenhydramine-acetaminophen (TYLENOL PM) 25-500 MG TABS Take 2 tablets by mouth at bedtime.    Marland Kitchen EPINEPHrine 0.3 mg/0.3 mL IJ SOAJ injection INJECT 0.3ML INTO MUSCLE ONCE A DAY AS NEEDED FOR ANAPHYLACTIC REACTIONS.  1  . escitalopram (LEXAPRO) 20 MG tablet TAKE 1 TABLET BY MOUTH EVERY DAY 90 tablet 0  . Multiple Vitamin (MULTIVITAMIN WITH MINERALS) TABS tablet Take 1 tablet by mouth daily.    . naproxen (NAPROSYN) 375 MG tablet Take 1 tablet (375 mg total) by mouth 2 (two) times daily. 20 tablet 0  . ONE TOUCH ULTRA TEST test strip USE TO TEST BLOOD GLUCOSE DAILY 50 each 5  . pantoprazole (PROTONIX) 40 MG tablet Take 1 tablet (40 mg total) by mouth 2 (two) times daily. (Patient taking differently: Take 40 mg by mouth daily before breakfast. ) 60 tablet 5  . pimecrolimus (ELIDEL) 1 % cream Apply 1 application topically 2 (two) times daily as needed (FOR SKIN BLEMISHES.).    Marland Kitchen Plecanatide (TRULANCE) 3 MG TABS Take 3 mg by mouth daily. 15 tablet 0  . tamoxifen (NOLVADEX) 10 MG tablet Take 1 tablet (10 mg total) 2 (two) times daily by mouth. 90 tablet 4  . valACYclovir (VALTREX) 1000 MG tablet Take 1 tablet (1,000 mg total) by mouth daily. 30 tablet 5  . vitamin B-12 (CYANOCOBALAMIN) 1000 MCG tablet Take 1,000 mcg by mouth daily.     No current facility-administered medications for this visit.     OBJECTIVE: Middle-aged [place] American woman in no acute distress  Vitals:   05/19/18 1337  BP: (!) 101/52  Pulse: 62  Resp: 20  Temp: 98.6 F (37 C)  SpO2: 100%  Body mass index is 29.57 kg/m.  ECOG: 1 Filed Weights   05/19/18 1337  Weight: 156 lb 8 oz (71 kg)   Sclerae unicteric, EOMs intact No cervical or supraclavicular adenopathy Lungs no rales or rhonchi Heart regular rate and rhythm Abd soft, nontender, positive bowel sounds MSK no focal  spinal tenderness, no upper extremity lymphedema Neuro: nonfocal, well oriented, appropriate affect Breasts: Right breast is benign.  The area in the right upper anterior shoulder and right axilla which seems to the patient to be slightly swollen, is only fat and connective tissue, with no suspicious mass findings .  The left breast undergone lumpectomy followed by radiation.  There is still some hyperpigmentation and there is still significant tenderness to palpation in the left axilla.  LAB RESULTS:   Lab Results  Component Value Date   WBC 8.6 05/19/2018   NEUTROABS 5.5 05/19/2018   HGB 11.8 (L) 05/19/2018   HCT 35.8 (L) 05/19/2018   MCV 85.2 05/19/2018   PLT 177 05/19/2018      Chemistry      Component Value Date/Time   NA 143 05/19/2018 1323   NA 145 (H) 03/18/2018 1046   NA 147 (H) 05/19/2017 1335   K 3.7 05/19/2018 1323   K 4.0 05/19/2017 1335   CL 107 05/19/2018 1323   CO2 28 05/19/2018 1323   CO2 28 05/19/2017 1335   BUN 12 05/19/2018 1323   BUN 12 03/18/2018 1046   BUN 8.2 05/19/2017 1335   CREATININE 1.10 (H) 05/19/2018 1323   CREATININE 0.9 05/19/2017 1335      Component Value Date/Time   CALCIUM 9.2 05/19/2018 1323  CALCIUM 9.1 05/19/2017 1335   ALKPHOS 54 05/19/2018 1323   ALKPHOS 41 05/19/2017 1335   AST 13 (L) 05/19/2018 1323   AST 16 05/19/2017 1335   ALT 11 05/19/2018 1323   ALT 14 05/19/2017 1335   BILITOT 0.4 05/19/2018 1323   BILITOT 0.3 03/18/2018 1046   BILITOT 0.36 05/19/2017 1335         STUDIES: No results found.   ASSESSMENT/PLAN: 57 y.o. [place], [place] woman   (1)  status post left breast upper outer quadrant biopsy 03/29/2013 for a clinical T2-T3 pN1, stage IIB-IIIA invasive ductal carcinoma, grade 2-3, estrogen receptor 39% positive (with moderate staining intensity), progesterone receptor negative, with an MIB-1 of 83%, and HER-2 amplified by CISH, with a ratio of 7.5 and 15 HER-2 copies per cell  (2) neoadjuvant chemotherapy,  with trastuzumab/ pertuzumab/ carboplatin/ docetaxel repeated every 3 weeks x6, completed 08/21/2013. Pertuzumab  held after 4 cycles secondary to diarrhea. The patient received Neulasta on day 2 at [facility] per her request.   (3) Trastuzumab continued for total of one year (last dose 04/13/2014)). Most recent echo 06/04/2014 showed a well preserved ejection fraction.  (4)  status post left lumpectomy and axillary node dissection 09/12/2013, with a complete pathologic response (a total of 16 lymph nodes were examined,1 sentinel and 15 non-sentinel),  YpT0, ypN0.  (5) adjuvant radiation at [facility] completed 12/08/2013  (6) started anastrozole 01/16/2014  (a) bone density 04/09/2016 shows osteopenia with a T score of -1.7   (b) switched to tamoxifen as of OCT 2017 because of vaginal dryness issues   (i) status post remote total abdominal hysterectomy with right salpingo-oophorectomy  (c) pelvic and transvaginal ultrasound found a normal left ovary, vaginal cuff appeared normal, with no area of suspicious attachment of the bowel to the cuff apex  (d) tamoxifen discontinued November 2019  (7)  genetics testing 06/22/2016 through the 21-gene Custom Cancer Panel through [employer], MD) found no deleterious mutations in ATM, BARD1, BRCA1, BRCA2, BRIP1, CDH1, CHEK2, EPCAM, FANCC, HOXB13, MLH1, MSH2, MSH6, NBN, PALB2, PMS2, PTEN, RAD51C, RAD51D, TP53, and XRCC2.    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    PLAN: Shannah is now a little over 5 years out from initial diagnosis of breast cancer, with no evidence of disease activity.  This is very favorable.  She is completing 5 years of antiestrogens.  This has decreased by half her risk of the original breast cancer recurring and also her risk of developing another breast cancer.  I reassured her that what she is feeling in her left axilla  is postoperative discomfort.  Unfortunately this is likely to be permanent.  The changes she is noticing in the right upper anterior chest wall area is simply fatty and connective tissue.  I explained that the axilla is a very complex area where tendons blood vessels nerves and of course lymph nodes are there altogether and when the surgeon has to remove the lymph nodes it is very difficult not to traumatize the entire area.  She is very likely to continue to have discomfort in the left axilla.  The point though is that that does not mean she has any cancer there  At this point I feel comfortable releasing her to her primary care physicians care.  All she will need in terms of breast cancer follow-up is a yearly physician breast exam and yearly mammography.  She  is interested in our survivorship program and I have made her a return appointment here with our survivorship nurse practitioner for July 2020, and months after her June mammography  I will be glad to see the patient again at any point in the future if and when the need arises but as of now we are making her no further routine appointments with me here     Magrinat, Virgie Dad, MD  05/19/18 2:35 PM Medical Oncology and Hematology [facility] [place], [place] [address] Tel. [phone]    Fax. [phone]    I, Margit Banda am acting as a scribe for Chauncey Cruel, MD.   I, Lurline Del MD, have reviewed the above documentation for accuracy and completeness, and I agree with the above.

## 2018-05-20 ENCOUNTER — Other Ambulatory Visit: Payer: Self-pay | Admitting: Family Medicine

## 2018-05-20 DIAGNOSIS — R69 Illness, unspecified: Secondary | ICD-10-CM | POA: Diagnosis not present

## 2018-06-01 ENCOUNTER — Ambulatory Visit: Payer: Medicare HMO | Admitting: Gastroenterology

## 2018-06-01 ENCOUNTER — Encounter: Payer: Self-pay | Admitting: Gastroenterology

## 2018-06-01 VITALS — BP 106/58 | HR 59 | Temp 97.2°F | Ht 61.0 in | Wt 156.6 lb

## 2018-06-01 DIAGNOSIS — K5900 Constipation, unspecified: Secondary | ICD-10-CM

## 2018-06-01 DIAGNOSIS — R140 Abdominal distension (gaseous): Secondary | ICD-10-CM | POA: Diagnosis not present

## 2018-06-01 DIAGNOSIS — N824 Other female intestinal-genital tract fistulae: Secondary | ICD-10-CM | POA: Insufficient documentation

## 2018-06-01 DIAGNOSIS — R110 Nausea: Secondary | ICD-10-CM | POA: Diagnosis not present

## 2018-06-01 DIAGNOSIS — R1011 Right upper quadrant pain: Secondary | ICD-10-CM

## 2018-06-01 MED ORDER — POLYETHYLENE GLYCOL 3350 17 GM/SCOOP PO POWD: ORAL | 0 refills | Status: DC

## 2018-06-01 NOTE — Progress Notes (Addendum)
REVIEWED. NO FISTULA ON CT. REVIEWED WITH BOLES/SEE GYN.  Primary Care Physician: Mikey Kirschner, MD  Primary Gastroenterologist:  Barney Drain, MD   Chief Complaint  Patient presents with  . abdominal swelling    x 1 week  . Bloated  . Nausea    no vomiting    HPI: [name] [name] is a 57 y.o. female here follow up. Last seen in 10/2017. She has h/o GERD/IBS-constipation predominant. Previously Amitiza 12mg BID not strong enough. 267m BID too strong as well as once daily. She tried Linzess 7259mdaily (3/4 of contents in water) and was too strong. Metamucil caused bloating. After last ov, we tried Trulance but states it was not helpful. She had TCS in 01/2018, showing hemorrhoids.   Complains of several weeks of bloating and swelling. Continued nausea. Pain in upper abdomen more ruq but not necessarily related to meals. She has BM twice per week. Takes glycerin suppository every two days. She will usually get urge to have BM, cramps and then may have incontinence. If she has to hold her urge to have BM she will usually end up with fecal incontinence and then also pass stool through vagina.   She says she has had stool pass through her vagina for years. Passes air as well. Sometimes when uses glycerin suppository, she will pass dissolved glycerin through vagina. She has seen gyn, Dr. FerGlo Herring past for this. Unable to pinpoint source of suspected fistula on pelvic u/s and CT remotely. Last episode of passing stool through vagina was two weeks ago. No melena, brbpr.    Recent abdominal ultrasound was unremarkable gallbladder. Pelvic ultrasound unremarkable as well except for difficult to separate left ovary from bowel.   Current Outpatient Medications  Medication Sig Dispense Refill  . acetaminophen (TYLENOL) 500 MG tablet Take 500 mg by mouth every 6 (six) hours as needed for moderate pain.     . AMarland KitchenPRAZolam (XANAX) 1 MG tablet TAKE 1 TABLET BY MOUTH THREE TIMES A DAY AS NEEDED  FOR ANXIETY 60 tablet 5  . atenolol (TENORMIN) 50 MG tablet TAKE 1 TABLET BY MOUTH TWICE A DAY 180 tablet 1  . atorvastatin (LIPITOR) 20 MG tablet Take 1 tablet (20 mg total) by mouth daily at 6 PM. 30 tablet 5  . blood glucose meter kit and supplies KIT Dispense based on patient and insurance preference. Use as directed by physician. Diabetes type 2. E11.9 1 each 0  . blood glucose meter kit and supplies Dispense based on patient and insurance preference. Use up to test glucose once daily. For ICD -10 E11.9. 1 each 0  . diphenhydramine-acetaminophen (TYLENOL PM) 25-500 MG TABS Take 2 tablets by mouth at bedtime.    . EMarland KitchenINEPHrine 0.3 mg/0.3 mL IJ SOAJ injection INJECT 0.3ML INTO MUSCLE ONCE A DAY AS NEEDED FOR ANAPHYLACTIC REACTIONS.  1  . escitalopram (LEXAPRO) 20 MG tablet TAKE 1 TABLET BY MOUTH EVERY DAY 90 tablet 0  . Lancets (ONETOUCH DELICA PLUS [id number] USE TO TEST BLOOD GLUCOSE DAILY 100 each 5  . Multiple Vitamin (MULTIVITAMIN WITH MINERALS) TABS tablet Take 1 tablet by mouth daily.    . ONE TOUCH ULTRA TEST test strip USE TO TEST BLOOD GLUCOSE DAILY 50 each 5  . pantoprazole (PROTONIX) 40 MG tablet Take 1 tablet (40 mg total) by mouth 2 (two) times daily. (Patient taking differently: Take 40 mg by mouth daily before breakfast. ) 60 tablet 5  . pimecrolimus (ELIDEL) 1 % cream Apply  1 application topically 2 (two) times daily as needed (FOR SKIN BLEMISHES.).    Marland Kitchen valACYclovir (VALTREX) 1000 MG tablet Take 1 tablet (1,000 mg total) by mouth daily. 30 tablet 5  . vitamin B-12 (CYANOCOBALAMIN) 1000 MCG tablet Take 1,000 mcg by mouth daily.     No current facility-administered medications for this visit.     Allergies as of 06/01/2018 - Review Complete 06/01/2018  Allergen Reaction Noted  . Metformin and related  03/18/2018  . Doxycycline Itching and Swelling 06/27/2012    ROS:  General: Negative for anorexia, weight loss, fever, chills, fatigue, weakness. ENT: Negative for  hoarseness, difficulty swallowing , nasal congestion. CV: Negative for chest pain, angina, palpitations, dyspnea on exertion, peripheral edema.  Respiratory: Negative for dyspnea at rest, dyspnea on exertion, cough, sputum, wheezing.  GI: See history of present illness. GU:  Negative for dysuria, hematuria, urinary incontinence, urinary frequency, nocturnal urination. See hpi Endo: Negative for unusual weight change.    Physical Examination:   BP (!) 106/58   Pulse (!) 59   Temp (!) 97.2 F (36.2 C) (Oral)   Ht '5\' 1"'  (1.549 m)   Wt 156 lb 9.6 oz (71 kg)   LMP 12/04/2008   BMI 29.59 kg/m   General: Well-nourished, well-developed in no acute distress.  Eyes: No icterus. Mouth: Oropharyngeal mucosa moist and pink , no lesions erythema or exudate. Lungs: Clear to auscultation bilaterally.  Heart: Regular rate and rhythm, no murmurs rubs or gallops.  Abdomen: Bowel sounds are normal, nontender, nondistended, no hepatosplenomegaly or masses, no abdominal bruits or hernia , no rebound or guarding.   Extremities: No lower extremity edema. No clubbing or deformities. Neuro: Alert and oriented x 4   Skin: Warm and dry, no jaundice.   Psych: Alert and cooperative, normal mood and affect.  Labs:  Lab Results  Component Value Date   CREATININE 1.10 (H) 05/19/2018   BUN 12 05/19/2018   NA 143 05/19/2018   K 3.7 05/19/2018   CL 107 05/19/2018   CO2 28 05/19/2018   Lab Results  Component Value Date   ALT 11 05/19/2018   AST 13 (L) 05/19/2018   ALKPHOS 54 05/19/2018   BILITOT 0.4 05/19/2018   Lab Results  Component Value Date   WBC 8.6 05/19/2018   HGB 11.8 (L) 05/19/2018   HCT 35.8 (L) 05/19/2018   MCV 85.2 05/19/2018   PLT 177 05/19/2018    Imaging Studies: No results found.

## 2018-06-01 NOTE — Patient Instructions (Addendum)
1. CT as planned. You will need to have labs at least one day before your scan. 2. Try miralax one capful at bedtime on days you have not had good bowel movement.  3. Await CT, if upper abdominal pain not explained, we will work up further.

## 2018-06-05 NOTE — Assessment & Plan Note (Signed)
Persistent constipation. Tried Amitiza, Linzess, Trulance multiple doses and either medication too strong or didn't work. Detailed above. Try miralax.

## 2018-06-05 NOTE — Assessment & Plan Note (Signed)
Greater than 8 year y/o passage of stool/air from vagina. 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 Plan for CT in near future with both oral and rectal contrast in addition to IV contrast.

## 2018-06-05 NOTE — Assessment & Plan Note (Addendum)
RUQ pain/bloating/nausea. Manage constipation. Reflux is well controlled. CT in near future to evaluate bloating/abd pain/suspected colorectal fistula. Further recommendations to follow. May require EGD to further evaluate upper gi symptoms.

## 2018-06-06 ENCOUNTER — Telehealth: Payer: Self-pay

## 2018-06-06 NOTE — Progress Notes (Signed)
cc'ed to pcp °

## 2018-06-06 NOTE — Telephone Encounter (Signed)
Submitted PA for CT abd/pelvis w/contrast via [facility]. Case pending. Service order: [id number].

## 2018-06-07 NOTE — Telephone Encounter (Signed)
Case pending per [facility]. US Pelvis (transabdominal only) and US Pelvis Transvaginal non-ob added to OV note and [place] abd complete that were already faxed to review. Refaxed records to [facility] for review.

## 2018-06-09 NOTE — Telephone Encounter (Signed)
Received fax from Margaret, physician recommending MRI Pelvis without contrast, followed by contrast as an alternative study to the CT abd/pelvis w/contrast. Informed LSL of fax. LSL advised to contact [facility] radiology to see if they can do rectal contrast with MRI. Called [facility] radiology and spoke to [facility]. Stanton Kidney stated they do not do rectal contrast for MRI. LSL informed and requests to proceed with CT abd/pelvis w/contrast d/t rectal contrast is needed.  Called [employer] and spoke to intake rep. Informed rep LSL would like to proceed with CT order and declines MRI Pelvis. Peer to peer review scheduled for 06/10/18 at 8:30am. Dr. Junious Silk will call office to speak to LSL for peer to peer review.

## 2018-06-10 NOTE — Telephone Encounter (Signed)
Completed peer to peer review with Dr. Junious Silk.  Study has been improved for CT abdomen pelvis with rectal, oral, IV contrast.  Confirmation code [id number].  I also spoke with radiologist who specializes in reading pelvic imaging, Dr. Polly Cobia.  He advised best study to try to locate colovaginal fistula would be CT pelvis/abdomen with IV, oral, rectal contrast.

## 2018-06-10 NOTE — Telephone Encounter (Signed)
PA noted.

## 2018-06-15 ENCOUNTER — Telehealth: Payer: Self-pay | Admitting: Nutrition

## 2018-06-15 ENCOUNTER — Ambulatory Visit: Payer: Medicare HMO | Admitting: Nutrition

## 2018-06-15 NOTE — Telephone Encounter (Signed)
VM to call and reschedule missed appt. 

## 2018-06-20 ENCOUNTER — Ambulatory Visit (INDEPENDENT_AMBULATORY_CARE_PROVIDER_SITE_OTHER): Payer: Medicare HMO | Admitting: Family Medicine

## 2018-06-20 ENCOUNTER — Encounter: Payer: Self-pay | Admitting: Family Medicine

## 2018-06-20 VITALS — BP 122/80 | Ht 61.0 in | Wt 157.0 lb

## 2018-06-20 DIAGNOSIS — E119 Type 2 diabetes mellitus without complications: Secondary | ICD-10-CM | POA: Diagnosis not present

## 2018-06-20 DIAGNOSIS — F32A Depression, unspecified: Secondary | ICD-10-CM

## 2018-06-20 DIAGNOSIS — I10 Essential (primary) hypertension: Secondary | ICD-10-CM | POA: Diagnosis not present

## 2018-06-20 DIAGNOSIS — F329 Major depressive disorder, single episode, unspecified: Secondary | ICD-10-CM

## 2018-06-20 DIAGNOSIS — R69 Illness, unspecified: Secondary | ICD-10-CM | POA: Diagnosis not present

## 2018-06-20 LAB — POCT GLYCOSYLATED HEMOGLOBIN (HGB A1C): Hemoglobin A1C: 7 % — AB (ref 4.0–5.6)

## 2018-06-20 MED ORDER — METFORMIN HCL ER 500 MG PO TB24: ORAL_TABLET | ORAL | 5 refills | Status: DC

## 2018-06-20 MED ORDER — PANTOPRAZOLE SODIUM 40 MG PO TBEC: 40.0000 mg | DELAYED_RELEASE_TABLET | Freq: Every day | ORAL | 1 refills | Status: DC

## 2018-06-20 MED ORDER — ATORVASTATIN CALCIUM 20 MG PO TABS: 20.0000 mg | ORAL_TABLET | Freq: Every day | ORAL | 5 refills | Status: DC

## 2018-06-20 MED ORDER — ATENOLOL 50 MG PO TABS: 50.0000 mg | ORAL_TABLET | Freq: Two times a day (BID) | ORAL | 1 refills | Status: DC

## 2018-06-20 MED ORDER — ESCITALOPRAM OXALATE 20 MG PO TABS: 20.0000 mg | ORAL_TABLET | Freq: Every day | ORAL | 1 refills | Status: DC

## 2018-06-20 NOTE — Progress Notes (Signed)
   Subjective:    Patient ID: [name] [name], female    DOB: [birth year], 57 y.o.   MRN: [id number]  HPI  Patient is here today to follow up on her chronic illnesses. She says she is not currently on any blood sugar lowering medications . She says she was told to come off of the Metformin three months ago.    She has a history of depression and anxiety,she is taking xanax once QHS,and Lexapro 20 mg once per night. Results for orders placed or performed in visit on 06/20/18  POCT glycosylated hemoglobin (Hb A1C)  Result Value Ref Range   Hemoglobin A1C 7.0 (A) 4.0 - 5.6 %   HbA1c POC (<> result, manual entry)     HbA1c, POC (prediabetic range)     HbA1c, POC (controlled diabetic range)     Patient claims compliance with diabetes medication. No obvious side effects. Reports no substantial low sugar spells. Most numbers are generally in good range when checked fasting. Generally does not miss a dose of medication. Watching diabetic diet closely  Patient notes ongoing compliance with antidepressant medication. No obvious side effects. Reports does not miss a dose. Overall continues to help depression substantially. No thoughts of homicide or suicide. Would like to maintain medication. Not waling much, doing respit e for foster care and staying busy, has had some difficult experience with ice eposure  Diet overall etter but has stopped exercing  Notes more stifness      Blood pressure medicine and blood pressure levels reviewed today with patient. Compliant with blood pressure medicine. States does not miss a dose. No obvious side effects. Blood pressure generally good when checked elsewhere. Watching salt intake.    flyy aleeady given   Took about a month to get rid of them '' Has not cked sugars recntly     Review of Systems No headache, no major weight loss or weight gain, no chest pain no back pain abdominal pain no change in bowel habits complete ROS otherwise negative       Objective:   Physical Exam  Alert and oriented, vitals reviewed and stable, NAD ENT-TM's and ext canals WNL bilat via otoscopic exam Soft palate, tonsils and post pharynx WNL via oropharyngeal exam Neck-symmetric, no masses; thyroid nonpalpable and nontender Pulmonary-no tachypnea or accessory muscle use; Clear without wheezes via auscultation Card--no abnrml murmurs, rhythm reg and rate WNL Carotid pulses symmetric, without bruits       Assessment & Plan:  Impression #1 type 2 diabetes.  A1c rising.  On/off.  Good idea to resume metformin.  Discussed patient will do this  2.  Hypertension good control discussed maintain same meds  3.  Chronic depression/anxiety.  Ongoing.  Medications generally help.  Patient maintain  Diet exercise discussed.  Vaccines discussed.  Greater than 50% of this 25 minute face to face visit was spent in counseling and discussion and coordination of care regarding the above diagnosis/diagnosies Follow-up as scheduled in 4 months

## 2018-06-21 DIAGNOSIS — R140 Abdominal distension (gaseous): Secondary | ICD-10-CM | POA: Diagnosis not present

## 2018-06-21 DIAGNOSIS — R1011 Right upper quadrant pain: Secondary | ICD-10-CM | POA: Diagnosis not present

## 2018-06-21 DIAGNOSIS — N824 Other female intestinal-genital tract fistulae: Secondary | ICD-10-CM | POA: Diagnosis not present

## 2018-06-21 DIAGNOSIS — K5900 Constipation, unspecified: Secondary | ICD-10-CM | POA: Diagnosis not present

## 2018-06-21 DIAGNOSIS — R110 Nausea: Secondary | ICD-10-CM | POA: Diagnosis not present

## 2018-06-22 ENCOUNTER — Ambulatory Visit (HOSPITAL_COMMUNITY)
Admission: RE | Admit: 2018-06-22 | Discharge: 2018-06-22 | Disposition: A | Discharge status: Home / Self Care | Payer: Medicare HMO | Source: Ambulatory Visit | Attending: Gastroenterology | Admitting: Gastroenterology

## 2018-06-22 DIAGNOSIS — K760 Fatty (change of) liver, not elsewhere classified: Secondary | ICD-10-CM | POA: Diagnosis not present

## 2018-06-22 DIAGNOSIS — R140 Abdominal distension (gaseous): Secondary | ICD-10-CM | POA: Insufficient documentation

## 2018-06-22 DIAGNOSIS — R1011 Right upper quadrant pain: Secondary | ICD-10-CM | POA: Insufficient documentation

## 2018-06-22 LAB — CBC
HCT: 35.4 % (ref 35.0–45.0)
Hemoglobin: 12.1 g/dL (ref 11.7–15.5)
MCH: 28.7 pg (ref 27.0–33.0)
MCHC: 34.2 g/dL (ref 32.0–36.0)
MCV: 84.1 fL (ref 80.0–100.0)
MPV: 12 fL (ref 7.5–12.5)
Platelets: 219 10*3/uL (ref 140–400)
RBC: 4.21 10*6/uL (ref 3.80–5.10)
RDW: 13.8 % (ref 11.0–15.0)
WBC: 8.9 10*3/uL (ref 3.8–10.8)

## 2018-06-22 LAB — CREATININE, SERUM: Creat: 1.03 mg/dL (ref 0.50–1.05)

## 2018-06-22 MED ORDER — IOPAMIDOL (ISOVUE-300) INJECTION 61%
100.0000 mL | Freq: Once | INTRAVENOUS | Status: AC | PRN
  Administered 2018-06-22: 100 mL via INTRAVENOUS

## 2018-06-22 NOTE — Progress Notes (Signed)
PT is aware.

## 2018-06-27 NOTE — Progress Notes (Signed)
Pt is aware.  

## 2018-07-07 ENCOUNTER — Ambulatory Visit: Payer: Medicare HMO | Admitting: Gastroenterology

## 2018-08-08 ENCOUNTER — Encounter: Payer: Self-pay | Admitting: Nurse Practitioner

## 2018-08-08 ENCOUNTER — Telehealth: Payer: Self-pay | Admitting: *Deleted

## 2018-08-08 ENCOUNTER — Ambulatory Visit: Payer: Medicare HMO | Admitting: Nurse Practitioner

## 2018-08-08 ENCOUNTER — Encounter: Payer: Self-pay | Admitting: *Deleted

## 2018-08-08 ENCOUNTER — Other Ambulatory Visit: Payer: Self-pay | Admitting: *Deleted

## 2018-08-08 VITALS — BP 115/72 | HR 55 | Temp 98.0°F | Ht 61.0 in | Wt 157.2 lb

## 2018-08-08 DIAGNOSIS — N823 Fistula of vagina to large intestine: Secondary | ICD-10-CM | POA: Diagnosis not present

## 2018-08-08 DIAGNOSIS — R140 Abdominal distension (gaseous): Secondary | ICD-10-CM

## 2018-08-08 DIAGNOSIS — K632 Fistula of intestine: Secondary | ICD-10-CM

## 2018-08-08 DIAGNOSIS — K5900 Constipation, unspecified: Secondary | ICD-10-CM | POA: Diagnosis not present

## 2018-08-08 NOTE — Progress Notes (Signed)
cc'ed to pcp °

## 2018-08-08 NOTE — Patient Instructions (Signed)
Your health issues we discussed today were:   Fistula: 1. We will proceed with scheduling an MRI to evaluate for fistula. 2. As we discussed, if this does not find it we will take a break for now.  Constipation: 1. Continue taking MiraLAX as you have been. 2. You can adjust the dosage of the MiraLAX based on your response.  Bloating and gas: 1. You can take simethicone (Gas-X) as needed 2. I am giving you samples for [facility] colon health probiotics.  Take this for 1 to 2 months to see if it helps  Overall I recommend:  1. Return for follow-up in 3 months 2. Call us if you have any questions or concerns.  At [facility] we value your feedback. You may receive a survey about your visit today. Please share your experience as we strive to create trusting relationships with our patients to provide genuine, compassionate, quality care.  We appreciate your understanding and patience as we review any laboratory studies, imaging, and other diagnostic tests that are ordered as we care for you. Our office policy is 5 business days for review of these results, and any emergent or urgent results are addressed in a timely manner for your best interest. If you do not hear from our office in 1 week, please contact us.   We also encourage the use of MyChart, which contains your medical information for your review as well. If you are not enrolled in this feature, an access code is on this after visit summary for your convenience. Thank you for allowing [place] to be involved in your care.  It was great to see you today!  I hope you have a great day!!

## 2018-08-08 NOTE — Assessment & Plan Note (Signed)
The patient has worsening bloating and gas symptoms which seem to have worsened over the previous 6 months.  She occasionally take simethicone which helps somewhat.  As an aside MiraLAX can often cause symptoms such as this.  Given her vague symptoms we will try her on probiotics for 1 to 2 months to see if this helps.  Continue simethicone as needed.  Continue other medications.  Follow-up in 3 months.  Call for any worsening or severe symptoms.

## 2018-08-08 NOTE — Progress Notes (Signed)
Referring Provider: Mikey Kirschner, MD Primary Care Physician:  Mikey Kirschner, MD Primary GI:  Dr. [name]  Chief Complaint  Patient presents with  . Bloated    w/ gas  . ct result    HPI:   [name] [name] is a 58 y.o. female who presents for follow-up and to discuss CT results.  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  Noted history of GERD/IBS constipation predominant.  Amitiza 8 mcg not strong enough and 24 mcg too strong.  Tried 75% dose of 72 mcg Linzess which is too strong.  Metamucil caused bloating.  Trulance not helpful.  Colonoscopy in July 2019 showing hemorrhoids.  Last visit she complained of several weeks of bloating and swelling, continued nausea, upper abdominal/right upper quadrant pain not related to meals.  Bowel movement twice a week with glycerin suppository every 2 days. Typically has an urge to have a bowel movement, experiences cramps, then have some incontinence.  If she holds her urge to have a bowel movement she will usually end up with fecal incontinence and passed stool through her vagina.  She noted history of passing stool through her vagina for years as well as a her.  Has seen gynecology and have been unable to pinpoint the source of suspected fistula on imaging.  Last episode of passing stool per vagina was 2 weeks ago.  Abdominal ultrasound recently was unremarkable gallbladder, pelvic ultrasound unremarkable except for difficult to separate left ovary from bowel.  Recommended CT of the abdomen and pelvis with oral and rectal contrast.  Try MiraLAX for constipation.  CT was completed 06/22/2018 which found no radiographic evidence of rectovaginal fistula or other acute findings.  Mild hepatic steatosis.  Discussion with Dr. [name] wherein she discussed the CT with the radiologist.  Recommended refer patient to  gynecology.  Today she states she's doing ok overall.  We discussed her CT results extensively. She has been drinking a lot of peppermint herbal tea which has helped her "stomach issues" such as bloating, and abdominal noise. Her symptoms increased more over the 6 months. Worse with eating, has made her not want to eat much or go out (quality of life). Is using MiraLAX daily, but has to use it daily; this is working well. She does drink a lot of water. Denies abdominal pain, hematochezia, melena, fever, chills, unintentional weight loss. Denies chest pain, dyspnea, dizziness, lightheadedness, syncope, near syncope. Denies any other upper or lower GI symptoms.  Past Medical History:  Diagnosis Date  . Allergic rhinitis   . Anemia, unspecified 05/30/2013  . Anxiety   . Breast cancer ([place]) 03/31/13   left  . Cancer ([place])   . Chest pain 2009   Consultation-[facility], negative chest CT; nl echo in 2005; h/o palpitations  . Colitis 2010   not IBD  . Degenerative joint disease    + degenerative joint disease of the lumbosacral spine  . Depression   . Diabetes ([place])   . Diabetes ([place])   . Dysrhythmia    palpations  . GERD (gastroesophageal reflux disease)    "a little"  . Headache   . Heart murmur    "small"  . Herpes simplex type II infection   . Hypercholesteremia    "slightly high"  . Hypertension    does not have high blood pressure  . 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 Palpitations   . Personal  history of chemotherapy   . Personal history of radiation therapy   . Wears glasses     Past Surgical History:  Procedure Laterality Date  . ABDOMINAL HYSTERECTOMY  03/13/2007   TAH ?BSO--Dr Levin Bacon, [place]  . BREAST EXCISIONAL BIOPSY  04/2003   Left; benign disease  . BREAST LUMPECTOMY Left 09/2013  . BREAST LUMPECTOMY WITH NEEDLE LOCALIZATION AND AXILLARY LYMPH NODE DISSECTION Left 09/12/2013   Procedure: BREAST LUMPECTOMY WITH NEEDLE LOCALIZATION AND AXILLARY LYMPH NODE DISSECTION;   Surgeon: [name], MD;  Location: [facility];  Service: General;  Laterality: Left;  and axilla  . BREAST SURGERY    . COLONOSCOPY  10/2010   proctitis; melanosis coli  . COLONOSCOPY WITH PROPOFOL N/A 01/04/2018   Dr. [name]: hemorrhoids, follow up colonoscopy in 10 years  . EYE SURGERY Left    "fix lazy eye"  . PORTACATH PLACEMENT Right 04/21/2013   Procedure: INSERTION PORT-A-CATH;  Surgeon: [name], MD;  Location: [facility];  Service: General;  Laterality: Right;  . RIGHT OOPHORECTOMY  03/13/2007  . SUBOCCIPITAL CRANIECTOMY CERVICAL LAMINECTOMY N/A 05/10/2014   Procedure: SUBOCCIPITAL CRANIECTOMY CERVICAL LAMINECTOMY/DURAPLASTY;  Surgeon: [name], MD;  Location: [facility];  Service: Neurosurgery;  Laterality: N/A;  suboccipital craniectomy with cervical laminectomy and duraplasty  . TUBAL LIGATION      Current Outpatient Medications  Medication Sig Dispense Refill  . acetaminophen (TYLENOL) 500 MG tablet Take 500 mg by mouth every 6 (six) hours as needed for moderate pain.     Marland Kitchen ALPRAZolam (XANAX) 1 MG tablet TAKE 1 TABLET BY MOUTH THREE TIMES A DAY AS NEEDED FOR ANXIETY 60 tablet 5  . atenolol (TENORMIN) 50 MG tablet Take 1 tablet (50 mg total) by mouth 2 (two) times daily. 180 tablet 1  . atorvastatin (LIPITOR) 20 MG tablet Take 1 tablet (20 mg total) by mouth daily at 6 PM. 30 tablet 5  . blood glucose meter kit and supplies KIT Dispense based on patient and insurance preference. Use as directed by physician. Diabetes type 2. E11.9 1 each 0  . blood glucose meter kit and supplies Dispense based on patient and insurance preference. Use up to test glucose once daily. For ICD -10 E11.9. 1 each 0  . diphenhydramine-acetaminophen (TYLENOL PM) 25-500 MG TABS Take 2 tablets by mouth at bedtime.    Marland Kitchen EPINEPHrine 0.3 mg/0.3 mL IJ SOAJ injection INJECT 0.3ML INTO MUSCLE ONCE A DAY AS NEEDED FOR ANAPHYLACTIC REACTIONS.  1  . escitalopram (LEXAPRO) 20 MG tablet  Take 1 tablet (20 mg total) by mouth daily. 90 tablet 1  . Lancets (ONETOUCH DELICA PLUS [id number]) MISC USE TO TEST BLOOD GLUCOSE DAILY 100 each 5  . metFORMIN (GLUCOPHAGE XR) 500 MG 24 hr tablet Take one tablet by mouth every morning 30 tablet 5  . Multiple Vitamin (MULTIVITAMIN WITH MINERALS) TABS tablet Take 1 tablet by mouth daily.    . ONE TOUCH ULTRA TEST test strip USE TO TEST BLOOD GLUCOSE DAILY 50 each 5  . pantoprazole (PROTONIX) 40 MG tablet Take 1 tablet (40 mg total) by mouth daily before breakfast. 90 tablet 1  . pimecrolimus (ELIDEL) 1 % cream Apply 1 application topically 2 (two) times daily as needed (FOR SKIN BLEMISHES.).    Marland Kitchen polyethylene glycol powder (MIRALAX) powder TAKE ONCE CAPFUL AT BEDTIME ON DAYS YOU DO NOT HAVE A GOOD BOWEL MOVEMENT 255 g 0  . valACYclovir (VALTREX) 1000 MG tablet Take 1 tablet (1,000  mg total) by mouth daily. 30 tablet 5  . vitamin B-12 (CYANOCOBALAMIN) 1000 MCG tablet Take 1,000 mcg by mouth daily.     No current facility-administered medications for this visit.     Allergies as of 08/08/2018 - Review Complete 08/08/2018  Allergen Reaction Noted  . Doxycycline Itching and Swelling 06/27/2012    Family History  Problem Relation Age of Onset  . Aneurysm Mother        Cerebral  . Hypertension Mother   . Hyperlipidemia Mother   . Stroke Mother   . Coronary artery disease Father   . Hypertension Father   . Hyperlipidemia Father   . Heart attack Father        d. 63  . Lung cancer Father        dx early 51s; former smoker  . Lung cancer Brother   . Lung cancer Brother        d. 17y; smoker  . Lung cancer Sister        paternal half-sister; not a smoker; dx <60; d. 36y  . Leukemia Maternal Aunt        "blood cancer"; d. unspecified age  . Cervical cancer Paternal Aunt        d. late 36s  . Breast cancer Sister 71  . Other Sister        hx of hysterectomy for unspecified reason  . Multiple myeloma Brother        d. 40s  . Lung  cancer Brother        dx late 83s; smoker  . Prostate cancer Brother        dx late 90s  . Prostate cancer Brother        dx late 31s  . Lung cancer Brother   . Lung cancer Maternal Aunt        d. unspecified age  . Breast cancer Maternal Aunt        dx unspecified age  . Prostate cancer Cousin        maternal 1st cousin; dx older age  . Breast cancer Other        niece dx early 44s or before  . Leukemia Other        nephew d. 2y; "blood cancer"  . Colon cancer Neg Hx   . Diabetes Neg Hx     Social History   Socioeconomic History  . Marital status: Married    Spouse name: Not on file  . Number of children: 4  . Years of education: Not on file  . Highest education level: Not on file  Occupational History  . Occupation: Environmental manager work    Fish farm manager: [employer]  . Financial resource strain: Not on file  . Food insecurity:    Worry: Not on file    Inability: Not on file  . Transportation needs:    Medical: Not on file    Non-medical: Not on file  Tobacco Use  . Smoking status: Former Smoker    Packs/day: 0.50    Years: 15.00    Pack years: 7.50    Types: Cigarettes    Last attempt to quit: 07/06/1982    Years since quitting: 36.1  . Smokeless tobacco: Never Used  Substance and Sexual Activity  . Alcohol use: No  . Drug use: No  . Sexual activity: Not Currently    Partners: Male    Birth control/protection: Surgical    Comment: TAH  Lifestyle  .  Physical activity:    Days per week: Not on file    Minutes per session: Not on file  . Stress: Not on file  Relationships  . Social connections:    Talks on phone: Not on file    Gets together: Not on file    Attends religious service: Not on file    Active member of club or organization: Not on file    Attends meetings of clubs or organizations: Not on file    Relationship status: Not on file  Other Topics Concern  . Not on file  Social History Narrative  . Not on file    Review of  Systems: Complete ROS negative except as per HPI.   Physical Exam: BP 115/72   Pulse (!) 55   Temp 98 F (36.7 C) (Oral)   Ht 5' 1" (1.549 m)   Wt 157 lb 3.2 oz (71.3 kg)   LMP 12/04/2008   BMI 29.70 kg/m  General:   Alert and oriented. Pleasant and cooperative. Well-nourished and well-developed.  Eyes:  Without icterus, sclera clear and conjunctiva pink.  Ears:  Normal auditory acuity. Cardiovascular:  S1, S2 present without murmurs appreciated. Extremities without clubbing or edema. Respiratory:  Clear to auscultation bilaterally. No wheezes, rales, or rhonchi. No distress.  Gastrointestinal:  +BS, soft, non-tender and non-distended. No HSM noted. No guarding or rebound. No masses appreciated.  Rectal:  Deferred  Musculoskalatal:  Symmetrical without gross deformities. Neurologic:  Alert and oriented x4;  grossly normal neurologically. Psych:  Alert and cooperative. Normal mood and affect. Heme/Lymph/Immune: No excessive bruising noted.    08/08/2018 11:51 AM   Disclaimer: This note was dictated with voice recognition software. Similar sounding words can inadvertently be transcribed and may not be corrected upon review.

## 2018-08-08 NOTE — Assessment & Plan Note (Signed)
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  This is been chronic and ongoing for a number years.  She initially put it off for a while because they could not ever find the fistula on imaging.  She is seeing gynecology and had an ultrasound.  We did a CT scan which again did not find the fistula.  I discussed pursuing an MRI which, per research, has a higher success rate finding fistulas in this area.  She is agreeable to pursuing the MRI.  However, if that does not find her fistula she would like to hold off for a bit.  Otherwise, continue current medications and follow-up in 3 months.

## 2018-08-08 NOTE — Assessment & Plan Note (Signed)
Chronic difficulty manage constipation with multiple medication trials that either were ineffective or overshot her symptoms to the point of diarrhea.  She has tried MiraLAX which has helped although she needs to take it every day.  Recommend she continue MiraLAX.  I provided coupons to help with the outpatient over-the-counter cost.  Return for follow-up in 3 months.

## 2018-08-08 NOTE — Telephone Encounter (Signed)
PA submitted via [employer] website for MRI. PA is pending. [id number]

## 2018-08-08 NOTE — Progress Notes (Signed)
CC'ED TO PCP 

## 2018-08-08 NOTE — Progress Notes (Signed)
PT saw Walden Field, NP today and is being scheduled for the MRI.

## 2018-08-10 NOTE — Telephone Encounter (Signed)
PA for MRI was approved. Auth# [id number] dates 08/09/2018-11/07/2018.

## 2018-08-15 ENCOUNTER — Ambulatory Visit (HOSPITAL_COMMUNITY)
Admission: RE | Admit: 2018-08-15 | Discharge: 2018-08-15 | Disposition: A | Discharge status: Home / Self Care | Payer: Medicare HMO | Source: Ambulatory Visit | Attending: Nurse Practitioner | Admitting: Nurse Practitioner

## 2018-08-15 DIAGNOSIS — K632 Fistula of intestine: Secondary | ICD-10-CM | POA: Diagnosis not present

## 2018-08-15 DIAGNOSIS — N839 Noninflammatory disorder of ovary, fallopian tube and broad ligament, unspecified: Secondary | ICD-10-CM | POA: Diagnosis not present

## 2018-08-15 LAB — POCT I-STAT CREATININE: Creatinine, Ser: 0.9 mg/dL (ref 0.44–1.00)

## 2018-08-15 MED ORDER — GADOBUTROL 1 MMOL/ML IV SOLN
7.5000 mL | Freq: Once | INTRAVENOUS | Status: AC | PRN
  Administered 2018-08-15: 7.5 mL via INTRAVENOUS

## 2018-08-18 ENCOUNTER — Encounter: Payer: Self-pay | Admitting: Family Medicine

## 2018-08-18 ENCOUNTER — Ambulatory Visit (INDEPENDENT_AMBULATORY_CARE_PROVIDER_SITE_OTHER): Payer: Medicare HMO | Admitting: Family Medicine

## 2018-08-18 VITALS — BP 118/72 | Temp 98.3°F | Ht 61.0 in | Wt 158.4 lb

## 2018-08-18 DIAGNOSIS — Z780 Asymptomatic menopausal state: Secondary | ICD-10-CM

## 2018-08-18 DIAGNOSIS — N644 Mastodynia: Secondary | ICD-10-CM | POA: Diagnosis not present

## 2018-08-18 NOTE — Progress Notes (Signed)
   Subjective:    Patient ID: [name] [name], female    DOB: 1960/12/21, 58 y.o.   MRN: [id number]  HPI Left breast lump to left upper outer quadrant, same area as her previous breast cancer. Noticed it one month ago. Seems to be getting bigger and it is tender. History of breast cancer.  Patient has chronic tenderness to the left axilla.  No recent fevers or unexpected weight loss.     Review of Systems  Constitutional: Negative for fever and unexpected weight change.  Respiratory: Negative for shortness of breath.   Cardiovascular: Negative for chest pain.  Hematological: Negative for adenopathy.       Objective:   Physical Exam Vitals signs and nursing note reviewed.  Constitutional:      General: She is not in acute distress.    Appearance: She is well-developed.  HENT:     Head: Normocephalic and atraumatic.  Neck:     Musculoskeletal: Neck supple.  Cardiovascular:     Rate and Rhythm: Normal rate and regular rhythm.     Heart sounds: Normal heart sounds. No murmur.  Pulmonary:     Effort: Pulmonary effort is normal. No respiratory distress.     Breath sounds: Normal breath sounds.  Chest:     Breasts:        Right: Tenderness present. No bleeding, inverted nipple, mass, nipple discharge or skin change.        Left: Tenderness present. No bleeding, inverted nipple, mass, nipple discharge or skin change.     Comments: On exam patient with nodularity to both breasts and tenderness.  No dominant mass palpated.  Significantly tender to left axilla around scar tissue. Lymphadenopathy:     Upper Body:     Right upper body: No supraclavicular or axillary adenopathy.     Left upper body: No supraclavicular or axillary adenopathy.  Skin:    General: Skin is warm and dry.  Neurological:     Mental Status: She is alert and oriented to person, place, and time.  Psychiatric:        Behavior: Behavior normal.           Assessment & Plan:  1. Breast tenderness - Plan:  MM DIAG BREAST TOMO BILATERAL, US BREAST LTD UNI LEFT INC AXILLA, US BREAST LTD UNI RIGHT INC AXILLA  Patient has breast tenderness bilaterally.  No discrete masses palpated.  Patient with history of breast cancer to left upper outer quadrant of left breast.  Diagnostic mammogram and ultrasound from June 2019 showed an oil cyst in the right breast, no evidence of malignancy.  The recommendation was for bilateral diagnostic mammogram in 1 year.  We will go ahead and move this up to be done sooner to further evaluate patient's breast tenderness and nodularity on exam.  Discussed with patient that likely her pain to the left axilla will be long-term according to the last note from Dr. Jana Hakim.  Dr. Mickie Hillier was consulted on this case and is in agreement with the above treatment plan.  25 minutes was spent with the patient.  This statement verifies that 25 minutes was indeed spent with the patient.  More than 50% of this visit-total duration of the visit-was spent in counseling and coordination of care. The issues that the patient came in for today as reflected in the diagnosis (s) please refer to documentation for further details.

## 2018-08-22 ENCOUNTER — Telehealth: Payer: Self-pay | Admitting: Gastroenterology

## 2018-08-22 ENCOUNTER — Other Ambulatory Visit: Payer: Self-pay | Admitting: *Deleted

## 2018-08-22 DIAGNOSIS — R935 Abnormal findings on diagnostic imaging of other abdominal regions, including retroperitoneum: Secondary | ICD-10-CM

## 2018-08-22 NOTE — Telephone Encounter (Signed)
PT is aware of the pelvic US results and that Randall Hiss Forwarded a note to Dr. Wolfgang Phoenix.

## 2018-08-22 NOTE — Telephone Encounter (Signed)
[place], SHE HAS A QUESTION ABOUT HER TEST RESULTS

## 2018-08-23 ENCOUNTER — Encounter: Payer: Self-pay | Admitting: Family Medicine

## 2018-08-26 ENCOUNTER — Ambulatory Visit: Payer: Medicare HMO | Admitting: Obstetrics & Gynecology

## 2018-08-26 ENCOUNTER — Encounter: Payer: Self-pay | Admitting: Obstetrics & Gynecology

## 2018-08-26 VITALS — BP 113/67 | HR 56 | Ht 61.0 in | Wt 157.0 lb

## 2018-08-26 DIAGNOSIS — N838 Other noninflammatory disorders of ovary, fallopian tube and broad ligament: Secondary | ICD-10-CM | POA: Diagnosis not present

## 2018-08-26 DIAGNOSIS — K599 Functional intestinal disorder, unspecified: Secondary | ICD-10-CM

## 2018-08-26 NOTE — Progress Notes (Signed)
Chief Complaint  Patient presents with  . discuss MRI from 2/10      58 y.o. [id number] Patient's last menstrual period was 12/04/2008. The current method of family planning is status post hysterectomy.  Outpatient Encounter Medications as of 08/26/2018  Medication Sig  . acetaminophen (TYLENOL) 500 MG tablet Take 500 mg by mouth every 6 (six) hours as needed for moderate pain.   Marland Kitchen ALPRAZolam (XANAX) 1 MG tablet TAKE 1 TABLET BY MOUTH THREE TIMES A DAY AS NEEDED FOR ANXIETY  . atenolol (TENORMIN) 50 MG tablet Take 1 tablet (50 mg total) by mouth 2 (two) times daily.  Marland Kitchen atorvastatin (LIPITOR) 20 MG tablet Take 1 tablet (20 mg total) by mouth daily at 6 PM.  . blood glucose meter kit and supplies KIT Dispense based on patient and insurance preference. Use as directed by physician. Diabetes type 2. E11.9  . blood glucose meter kit and supplies Dispense based on patient and insurance preference. Use up to test glucose once daily. For ICD -10 E11.9.  . diphenhydramine-acetaminophen (TYLENOL PM) 25-500 MG TABS Take 2 tablets by mouth at bedtime.  Marland Kitchen EPINEPHrine 0.3 mg/0.3 mL IJ SOAJ injection INJECT 0.3ML INTO MUSCLE ONCE A DAY AS NEEDED FOR ANAPHYLACTIC REACTIONS.  Marland Kitchen escitalopram (LEXAPRO) 20 MG tablet Take 1 tablet (20 mg total) by mouth daily.  . Lancets (ONETOUCH DELICA PLUS [id number]) MISC USE TO TEST BLOOD GLUCOSE DAILY  . metFORMIN (GLUCOPHAGE XR) 500 MG 24 hr tablet Take one tablet by mouth every morning  . Multiple Vitamin (MULTIVITAMIN WITH MINERALS) TABS tablet Take 1 tablet by mouth daily.  . ONE TOUCH ULTRA TEST test strip USE TO TEST BLOOD GLUCOSE DAILY  . pantoprazole (PROTONIX) 40 MG tablet Take 1 tablet (40 mg total) by mouth daily before breakfast.  . polyethylene glycol powder (MIRALAX) powder TAKE ONCE CAPFUL AT BEDTIME ON DAYS YOU DO NOT HAVE A GOOD BOWEL MOVEMENT  . valACYclovir (VALTREX) 1000 MG tablet Take 1 tablet (1,000 mg total) by mouth daily.  . vitamin B-12  (CYANOCOBALAMIN) 1000 MCG tablet Take 1,000 mcg by mouth daily.  . [DISCONTINUED] pimecrolimus (ELIDEL) 1 % cream Apply 1 application topically 2 (two) times daily as needed (FOR SKIN BLEMISHES.).  . [DISCONTINUED] potassium chloride SA (K-DUR,KLOR-CON) 20 MEQ tablet Take 1 tablet (20 mEq total) by mouth 2 (two) times daily.   No facility-administered encounter medications on file as of 08/26/2018.     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 Allergic rhinitis   . Anemia, unspecified 05/30/2013  . Anxiety   . Breast cancer ([place]) 03/31/13   left  . Cancer ([place])   . Chest pain 2009   Consultation-[facility], negative chest CT; nl echo in 2005; h/o palpitations  . Colitis 2010   not IBD  . Degenerative joint disease    + degenerative joint disease of the lumbosacral spine  . Depression   . Diabetes ([place])   . Diabetes ([place])   . Dysrhythmia    palpations  . GERD (gastroesophageal reflux disease)    "a little"  . Headache   . Heart murmur    "small"  . Herpes simplex type II infection   . Hypercholesteremia    "  slightly high"  . Hypertension    does not have high blood pressure  . 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 Palpitations   . Personal history of chemotherapy   . Personal history of radiation therapy   . Wears glasses     Past Surgical History:  Procedure Laterality Date  . ABDOMINAL HYSTERECTOMY  03/13/2007   TAH ?BSO--Dr Levin Bacon, [place]  . BREAST EXCISIONAL BIOPSY  04/2003   Left; benign disease  . BREAST LUMPECTOMY Left 09/2013  . BREAST LUMPECTOMY WITH NEEDLE LOCALIZATION AND AXILLARY LYMPH NODE DISSECTION Left 09/12/2013   Procedure: BREAST LUMPECTOMY WITH NEEDLE LOCALIZATION AND AXILLARY LYMPH NODE DISSECTION;  Surgeon:  [name], MD;  Location: [facility];  Service: General;  Laterality: Left;  and axilla  . BREAST SURGERY    . COLONOSCOPY  10/2010   proctitis; melanosis coli  . COLONOSCOPY WITH PROPOFOL N/A 01/04/2018   Dr. [name]: hemorrhoids, follow up colonoscopy in 10 years  . EYE SURGERY Left    "fix lazy eye"  . PORTACATH PLACEMENT Right 04/21/2013   Procedure: INSERTION PORT-A-CATH;  Surgeon: [name], MD;  Location: [facility];  Service: General;  Laterality: Right;  . RIGHT OOPHORECTOMY  03/13/2007  . SUBOCCIPITAL CRANIECTOMY CERVICAL LAMINECTOMY N/A 05/10/2014   Procedure: SUBOCCIPITAL CRANIECTOMY CERVICAL LAMINECTOMY/DURAPLASTY;  Surgeon: [name], MD;  Location: [facility];  Service: Neurosurgery;  Laterality: N/A;  suboccipital craniectomy with cervical laminectomy and duraplasty  . TUBAL LIGATION      OB History    Gravida  4   Para  4   Term  4   Preterm      AB      Living  4     SAB      TAB      Ectopic      Multiple      Live Births  4           Allergies  Allergen Reactions  . Doxycycline Itching and Swelling    Social History   Socioeconomic History  . Marital status: Married    Spouse name: Not on file  . Number of children: 4  . Years of education: Not on file  . Highest education level: Not on file  Occupational History  . Occupation: Environmental manager work    Fish farm manager: [employer]  . Financial resource strain: Not on file  . Food insecurity:    Worry: Not on file    Inability: Not on file  . Transportation needs:    Medical: Not on file    Non-medical: Not on file  Tobacco Use  . Smoking status: Former Smoker    Packs/day: 0.50    Years: 15.00    Pack years: 7.50    Types: Cigarettes    Last attempt to quit: 07/06/1982    Years since quitting: 36.1  . Smokeless tobacco: Never Used  Substance and Sexual Activity  . Alcohol use: No  . Drug use: No  . Sexual activity: Yes    Partners: Male     Birth control/protection: Surgical    Comment: TAH  Lifestyle  . Physical activity:    Days per week: Not on file    Minutes per session: Not on file  . Stress: Not on file  Relationships  . Social connections:    Talks on phone: Not on file    Gets together: Not on file    Attends religious service: Not  on file    Active member of club or organization: Not on file    Attends meetings of clubs or organizations: Not on file    Relationship status: Not on file  Other Topics Concern  . Not on file  Social History Narrative  . Not on file    Family History  Problem Relation Age of Onset  . Aneurysm Mother        Cerebral  . Hypertension Mother   . Hyperlipidemia Mother   . Stroke Mother   . Coronary artery disease Father   . Hypertension Father   . Hyperlipidemia Father   . Heart attack Father        d. 28  . Lung cancer Father        dx early 1s; former smoker  . Lung cancer Brother   . Lung cancer Brother        d. 39y; smoker  . Lung cancer Sister        paternal half-sister; not a smoker; dx <60; d. 23y  . Leukemia Maternal Aunt        "blood cancer"; d. unspecified age  . Cervical cancer Paternal Aunt        d. late 79s  . Breast cancer Sister 72  . Other Sister        hx of hysterectomy for unspecified reason  . Multiple myeloma Brother        d. 48s  . Lung cancer Brother        dx late 35s; smoker  . Prostate cancer Brother        dx late 75s  . Prostate cancer Brother        dx late 71s  . Lung cancer Brother   . Lung cancer Maternal Aunt        d. unspecified age  . Breast cancer Maternal Aunt        dx unspecified age  . Prostate cancer Cousin        maternal 1st cousin; dx older age  . Breast cancer Other        niece dx early 61s or before  . Leukemia Other        nephew d. 2y; "blood cancer"  . Colon cancer Neg Hx   . Diabetes Neg Hx     Medications:       Current Outpatient Medications:  .  acetaminophen (TYLENOL) 500 MG tablet, Take  500 mg by mouth every 6 (six) hours as needed for moderate pain. , Disp: , Rfl:  .  ALPRAZolam (XANAX) 1 MG tablet, TAKE 1 TABLET BY MOUTH THREE TIMES A DAY AS NEEDED FOR ANXIETY, Disp: 60 tablet, Rfl: 5 .  atenolol (TENORMIN) 50 MG tablet, Take 1 tablet (50 mg total) by mouth 2 (two) times daily., Disp: 180 tablet, Rfl: 1 .  atorvastatin (LIPITOR) 20 MG tablet, Take 1 tablet (20 mg total) by mouth daily at 6 PM., Disp: 30 tablet, Rfl: 5 .  blood glucose meter kit and supplies KIT, Dispense based on patient and insurance preference. Use as directed by physician. Diabetes type 2. E11.9, Disp: 1 each, Rfl: 0 .  blood glucose meter kit and supplies, Dispense based on patient and insurance preference. Use up to test glucose once daily. For ICD -10 E11.9., Disp: 1 each, Rfl: 0 .  diphenhydramine-acetaminophen (TYLENOL PM) 25-500 MG TABS, Take 2 tablets by mouth at bedtime., Disp: , Rfl:  .  EPINEPHrine 0.3 mg/0.3 mL IJ  SOAJ injection, INJECT 0.3ML INTO MUSCLE ONCE A DAY AS NEEDED FOR ANAPHYLACTIC REACTIONS., Disp: , Rfl: 1 .  escitalopram (LEXAPRO) 20 MG tablet, Take 1 tablet (20 mg total) by mouth daily., Disp: 90 tablet, Rfl: 1 .  Lancets (ONETOUCH DELICA PLUS [id number]) MISC, USE TO TEST BLOOD GLUCOSE DAILY, Disp: 100 each, Rfl: 5 .  metFORMIN (GLUCOPHAGE XR) 500 MG 24 hr tablet, Take one tablet by mouth every morning, Disp: 30 tablet, Rfl: 5 .  Multiple Vitamin (MULTIVITAMIN WITH MINERALS) TABS tablet, Take 1 tablet by mouth daily., Disp: , Rfl:  .  ONE TOUCH ULTRA TEST test strip, USE TO TEST BLOOD GLUCOSE DAILY, Disp: 50 each, Rfl: 5 .  pantoprazole (PROTONIX) 40 MG tablet, Take 1 tablet (40 mg total) by mouth daily before breakfast., Disp: 90 tablet, Rfl: 1 .  polyethylene glycol powder (MIRALAX) powder, TAKE ONCE CAPFUL AT BEDTIME ON DAYS YOU DO NOT HAVE A GOOD BOWEL MOVEMENT, Disp: 255 g, Rfl: 0 .  valACYclovir (VALTREX) 1000 MG tablet, Take 1 tablet (1,000 mg total) by mouth daily., Disp: 30  tablet, Rfl: 5 .  vitamin B-12 (CYANOCOBALAMIN) 1000 MCG tablet, Take 1,000 mcg by mouth daily., Disp: , Rfl:   Objective Blood pressure 113/67, pulse (!) 56, height 5' 1" (1.549 m), weight 157 lb (71.2 kg), last menstrual period 12/04/2008.  Gen [facility] NAD  Pertinent ROS No burning with urination, frequency or urgency No nausea, vomiting or diarrhea Nor fever chills or other constitutional symptoms   Labs or studies Reviewed all scans since August 19, 2012    Impression Diagnoses this Encounter::   ICD-10-CM   1. Ovarian mass, left N83.8   2. 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   Labs or Scans Ordered: No orders of the defined types were placed in this encounter.   Management:: >reviewed all of the patients studies dating back to her original complaint in 2012-08-19, including ost recent ones, none of which demonstrate a fistula, colonoscopy was also negative last year so that piece is a puzzle  >however the episode only happens about once a month and she has chronic constipation, does not repsond to [place] or linzess, on miralax so as a reuslt  >I recommended a more holisitc approach to her functional bowel disorder and have given her a reference to [email or web address] to begin on phase 1 of [facility], she will contact me should she have questions regarding the program, she is encouraged to call [facility] to initiate the therapy, I photo copied the website information for the patient, she seems interested in giving it a try  Follow up Return in about 1 year (around 08/27/2019) for GYN sono, Follow up, with Dr Elonda Husky.        Face to face time:  15 minutes  Greater than 50% of the visit time was spent in counseling and coordination of care with the patient.  The summary and outline of the counseling and care coordination is summarized in the note above.   All  questions were answered.

## 2018-08-30 ENCOUNTER — Ambulatory Visit (HOSPITAL_COMMUNITY): Payer: Medicare HMO

## 2018-08-30 ENCOUNTER — Ambulatory Visit (HOSPITAL_COMMUNITY)
Admission: RE | Admit: 2018-08-30 | Discharge: 2018-08-30 | Disposition: A | Discharge status: Home / Self Care | Payer: Medicare HMO | Source: Ambulatory Visit | Attending: Family Medicine | Admitting: Family Medicine

## 2018-08-30 DIAGNOSIS — R928 Other abnormal and inconclusive findings on diagnostic imaging of breast: Secondary | ICD-10-CM | POA: Diagnosis not present

## 2018-08-30 DIAGNOSIS — Z780 Asymptomatic menopausal state: Secondary | ICD-10-CM | POA: Diagnosis present

## 2018-08-30 DIAGNOSIS — N644 Mastodynia: Secondary | ICD-10-CM

## 2018-08-30 DIAGNOSIS — Z853 Personal history of malignant neoplasm of breast: Secondary | ICD-10-CM | POA: Diagnosis not present

## 2018-09-12 ENCOUNTER — Telehealth: Payer: Self-pay | Admitting: Family Medicine

## 2018-09-12 NOTE — Telephone Encounter (Signed)
PA saved and will be completed once we get insurance info.

## 2018-09-12 NOTE — Telephone Encounter (Signed)
Pt calling to make [place] aware we will be receiving a PA on her escitalopram (LEXAPRO) 20 MG tablet. Her insurance has put it in a tier 2 category but can lower it to tier 1 if they have more information from the doctors office as to why she needs it. Pt states we will hear from insurance company within 72 hours.

## 2018-09-13 DIAGNOSIS — L601 Onycholysis: Secondary | ICD-10-CM | POA: Diagnosis not present

## 2018-09-13 NOTE — Telephone Encounter (Signed)
Form from insurance company received. Form is asking if patient can be switched to a lower tier formula alternative. Form in provider office please advise. Thank you

## 2018-09-20 DIAGNOSIS — I10 Essential (primary) hypertension: Secondary | ICD-10-CM | POA: Diagnosis not present

## 2018-09-20 DIAGNOSIS — H5203 Hypermetropia, bilateral: Secondary | ICD-10-CM | POA: Diagnosis not present

## 2018-09-20 DIAGNOSIS — E139 Other specified diabetes mellitus without complications: Secondary | ICD-10-CM | POA: Diagnosis not present

## 2018-09-28 ENCOUNTER — Telehealth: Payer: Self-pay | Admitting: Family Medicine

## 2018-09-28 MED ORDER — VALACYCLOVIR HCL 1 G PO TABS: 1000.0000 mg | ORAL_TABLET | Freq: Every day | ORAL | 5 refills | Status: DC

## 2018-09-28 NOTE — Telephone Encounter (Signed)
Patient is aware we have sent medication to the requested pharmacy.

## 2018-09-28 NOTE — Telephone Encounter (Signed)
Ok six ref 

## 2018-09-28 NOTE — Telephone Encounter (Signed)
Please advise 

## 2018-09-28 NOTE — Telephone Encounter (Signed)
Patient needs refill on valtrex 1000 mg  Sent [facility] [phone])

## 2018-09-30 ENCOUNTER — Telehealth: Payer: Self-pay | Admitting: Family Medicine

## 2018-09-30 NOTE — Telephone Encounter (Signed)
No that is pts issue with her insur co what does she use it fo? We can use the zovirax equivalent

## 2018-09-30 NOTE — Telephone Encounter (Signed)
Please advise. Thank you

## 2018-09-30 NOTE — Telephone Encounter (Signed)
Pt contacted and states that insurance is suppose to be faxing over a paper for [place] to complete. Pt states she would like to stay on the Valtrex. Pt uses Valtrex for genital herpes.

## 2018-09-30 NOTE — Telephone Encounter (Signed)
Patient states medication of valACYclovir (VALTREX) 1000 MG tablet cost is higher than normal, patient states she spoke with insurance company and if information can be sent in to [place] stating that medication is necessary for patient than cost may be lower and listed as tier 1.

## 2018-10-03 NOTE — Telephone Encounter (Signed)
Form from insurance company regarding Valtrex 1000 mg tablet. Pt would like to stay on Valtrex 1000 mg tablet. Please advise. Thank you

## 2018-10-07 ENCOUNTER — Telehealth: Payer: Self-pay | Admitting: Family Medicine

## 2018-10-07 MED ORDER — ACYCLOVIR 400 MG PO TABS: 400.0000 mg | ORAL_TABLET | Freq: Two times a day (BID) | ORAL | 11 refills | Status: DC

## 2018-10-07 NOTE — Telephone Encounter (Signed)
Zovirax 400 bid 60 11 ref

## 2018-10-07 NOTE — Telephone Encounter (Signed)
Nurses call pt ask exactly what she takes it for, does she take it preventively or just during flare, an d I will call in proper zovirax alternative today

## 2018-10-07 NOTE — Telephone Encounter (Signed)
Genital Herpes -take it preventively and would like med sent to [facility]

## 2018-10-07 NOTE — Telephone Encounter (Signed)
Prescription sent electronically to pharmacy. Patient notified. 

## 2018-10-07 NOTE — Telephone Encounter (Signed)
Pt called to ask if we can prescribe something else  Her insurance DENIED the valACYclovir (VALTREX) 1000 MG tablet because we did not submit the prior auth or tier exception within 72 hours  Please advise & call pt     [facility]

## 2018-10-18 ENCOUNTER — Ambulatory Visit: Payer: Medicare HMO | Admitting: Family Medicine

## 2018-10-18 ENCOUNTER — Ambulatory Visit (INDEPENDENT_AMBULATORY_CARE_PROVIDER_SITE_OTHER): Payer: Medicare HMO | Admitting: Family Medicine

## 2018-10-18 ENCOUNTER — Other Ambulatory Visit: Payer: Self-pay

## 2018-10-18 DIAGNOSIS — F419 Anxiety disorder, unspecified: Secondary | ICD-10-CM

## 2018-10-18 DIAGNOSIS — E119 Type 2 diabetes mellitus without complications: Secondary | ICD-10-CM

## 2018-10-18 DIAGNOSIS — I10 Essential (primary) hypertension: Secondary | ICD-10-CM

## 2018-10-18 DIAGNOSIS — F329 Major depressive disorder, single episode, unspecified: Secondary | ICD-10-CM | POA: Diagnosis not present

## 2018-10-18 DIAGNOSIS — R69 Illness, unspecified: Secondary | ICD-10-CM | POA: Diagnosis not present

## 2018-10-18 DIAGNOSIS — F32A Depression, unspecified: Secondary | ICD-10-CM

## 2018-10-18 MED ORDER — METFORMIN HCL ER 500 MG PO TB24: ORAL_TABLET | ORAL | 5 refills | Status: DC

## 2018-10-18 MED ORDER — ESCITALOPRAM OXALATE 20 MG PO TABS: 20.0000 mg | ORAL_TABLET | Freq: Every day | ORAL | 1 refills | Status: DC

## 2018-10-18 MED ORDER — ATENOLOL 50 MG PO TABS: 50.0000 mg | ORAL_TABLET | Freq: Two times a day (BID) | ORAL | 1 refills | Status: DC

## 2018-10-18 MED ORDER — ATORVASTATIN CALCIUM 20 MG PO TABS: 20.0000 mg | ORAL_TABLET | Freq: Every day | ORAL | 5 refills | Status: DC

## 2018-10-18 MED ORDER — PANTOPRAZOLE SODIUM 40 MG PO TBEC: 40.0000 mg | DELAYED_RELEASE_TABLET | Freq: Every day | ORAL | 1 refills | Status: DC

## 2018-10-18 MED ORDER — ALPRAZOLAM 1 MG PO TABS: ORAL_TABLET | ORAL | 5 refills | Status: DC

## 2018-10-18 NOTE — Progress Notes (Signed)
   Subjective:    Patient ID: [name] [name], female    DOB: 1960/07/31, 58 y.o.   MRN: [id number] Audio plus visual Diabetes  She presents for her follow-up diabetic visit. She has type 2 diabetes mellitus. Risk factors for coronary artery disease include diabetes mellitus, dyslipidemia and hypertension. Current diabetic treatment includes oral agent (monotherapy). She is compliant with treatment all of the time. Her weight is stable. She is following a diabetic diet.   Virtual Visit via Video Note  I connected with [name] [name] on 10/18/18 at  1:10 PM EDT by a video enabled telemedicine application and verified that I am speaking with the correct person using two identifiers.   I discussed the limitations of evaluation and management by telemedicine and the availability of in person appointments. The patient expressed understanding and agreed to proceed.  History of Present Illness:    Observations/Objective:   Assessment and Plan:   Follow Up Instructions:    I discussed the assessment and treatment plan with the patient. The patient was provided an opportunity to ask questions and all were answered. The patient agreed with the plan and demonstrated an understanding of the instructions.   The patient was advised to call back or seek an in-person evaluation if the symptoms worsen or if the condition fails to improve as anticipated.  I provided  minutes of non-face-to-face time during this encounter.    Patient claims compliance with diabetes medication. No obvious side effects. Reports no substantial low sugar spells. Most numbers are generally in good range when checked fasting. Generally does not miss a dose of medication. Watching diabetic diet closely  Blood pressure medicine and blood pressure levels reviewed today with patient. Compliant with blood pressure medicine. States does not miss a dose. No obvious side effects. Blood pressure generally good when checked  elsewhere. Watching salt intake.   Patient notes ongoing compliance with antidepressant medication. No obvious side effects. Reports does not miss a dose. Overall continues to help depression substantially. No thoughts of homicide or suicide. Would like to maintain medication.  Review of Systems No headache, no major weight loss or weight gain, no chest pain no back pain abdominal pain no change in bowel habits complete ROS otherwise negative     Objective:   Physical Exam   Virtual visit     Assessment & Plan:  Impression 1 hypertension.  Good control discussed to maintain same meds  2.  Hyperlipidemia.  Compliance with medications discussed medications maintain  3.  Chronic depression anxiety clinically stable patient to maintain same  4.  Type 2 diabetes glucose overall good compliance discussed to maintain same  5.  History of Valtrex use in the past for herpes control will switch to Zovirax because of insurance

## 2018-10-28 ENCOUNTER — Telehealth: Payer: Self-pay | Admitting: Family Medicine

## 2018-10-28 MED ORDER — AMOXICILLIN 500 MG PO CAPS: 500.0000 mg | ORAL_CAPSULE | Freq: Three times a day (TID) | ORAL | 0 refills | Status: DC

## 2018-10-28 NOTE — Telephone Encounter (Signed)
So it is difficult to know for certain what is causing this  If she is having pain and discomfort and cannot hear out of the ear it is reasonable to do amoxicillin 500 mg 3 times daily for 7 days  I also think it would be reasonable for her to do a morning office visit next week if it is not resolving Therefore I would recommend having the patient call Monday morning if it is not resolving and Dr. Richardson Landry can arrange an office visit to look in the ear

## 2018-10-28 NOTE — Telephone Encounter (Signed)
Patient called with right ear pain,numb, cant hear this has been going on for two weeks now. She states tried ear drops but not helping. Please advise

## 2018-10-28 NOTE — Telephone Encounter (Signed)
Patient would like the antibiotic called into the pharmacy. Prescription sent electronically to pharmacy.  Patient will call back Monday and schedule a virtual visit if no better.

## 2018-11-01 ENCOUNTER — Ambulatory Visit (INDEPENDENT_AMBULATORY_CARE_PROVIDER_SITE_OTHER): Payer: Medicare HMO | Admitting: Family Medicine

## 2018-11-01 ENCOUNTER — Other Ambulatory Visit: Payer: Self-pay

## 2018-11-01 ENCOUNTER — Telehealth: Payer: Self-pay | Admitting: Family Medicine

## 2018-11-01 ENCOUNTER — Encounter: Payer: Self-pay | Admitting: Family Medicine

## 2018-11-01 VITALS — Temp 98.1°F

## 2018-11-01 DIAGNOSIS — Z8669 Personal history of other diseases of the nervous system and sense organs: Secondary | ICD-10-CM

## 2018-11-01 DIAGNOSIS — H60331 Swimmer's ear, right ear: Secondary | ICD-10-CM

## 2018-11-01 MED ORDER — NEOMYCIN-POLYMYXIN-HC 3.5-10000-1 OT SOLN: OTIC | 0 refills | Status: DC

## 2018-11-01 MED ORDER — CEFDINIR 300 MG PO CAPS: 300.0000 mg | ORAL_CAPSULE | Freq: Two times a day (BID) | ORAL | 0 refills | Status: DC

## 2018-11-01 NOTE — Progress Notes (Signed)
   Subjective:    Patient ID: [name] [name], female    DOB: [birth year], 58 y.o.   MRN: [id number]  HPI    Review of Systems     Objective:   Physical Exam        Assessment & Plan:

## 2018-11-01 NOTE — Telephone Encounter (Signed)
Pt called last week tel enc 4/24. she is still having right ear pain and swelling on side of her neck.   [id number] [place] [place] [facility] [address]

## 2018-11-01 NOTE — Progress Notes (Signed)
   Subjective:    Patient ID: [name] [name], female    DOB: March 11, 1961, 58 y.o.   MRN: [id number]  Otalgia   There is pain in the right ear. This is a new problem. Episode onset: 2 weeks. There has been no fever. Treatments tried: otc ear drops, amoxil. The treatment provided no relief.    Ref for ent for persistent wax  History of recurrent external otitis over the years.  History of cerumen impaction generally self treated with Debrox drops.  Quite a bit of pain in right ear.  Started on amoxicillin.  Not helping much.  Some headache on that side also no ear discharge  Review of Systems  HENT: Positive for ear pain.        Objective:   Physical Exam  Alert vitals stable, NAD. Blood pressure good on repeat. HEENT normal. Lungs clear. Heart regular rate and rhythm. Right external canal swollen cerumen impaction.  Positive tender anterior nodes      Assessment & Plan:  Impression external otitis with cerumen impaction plan local measures discussed.  Cortisporin otic suspension.  Antibiotics.  Symptom care discussed ENT referral to get the wax out

## 2018-11-01 NOTE — Telephone Encounter (Signed)
Upon looking at previous messages, Dr.Scott states patient would need to come in to see Dr.Steve so he can take a look at it. Pt transferred up front to set up office visit.

## 2018-11-09 ENCOUNTER — Other Ambulatory Visit: Payer: Self-pay

## 2018-11-09 ENCOUNTER — Encounter: Payer: Self-pay | Admitting: Gastroenterology

## 2018-11-09 ENCOUNTER — Telehealth: Payer: Self-pay | Admitting: Gastroenterology

## 2018-11-09 ENCOUNTER — Ambulatory Visit: Payer: Medicare HMO | Admitting: Nurse Practitioner

## 2018-11-09 NOTE — Telephone Encounter (Signed)
PATIENT WAS A NO SHOW/NO ANSWER AND LETTER SENT  °

## 2018-11-09 NOTE — Progress Notes (Signed)
Patient is a now show

## 2018-11-10 ENCOUNTER — Ambulatory Visit (INDEPENDENT_AMBULATORY_CARE_PROVIDER_SITE_OTHER): Payer: Medicare HMO | Admitting: Otolaryngology

## 2018-11-10 DIAGNOSIS — H60331 Swimmer's ear, right ear: Secondary | ICD-10-CM | POA: Diagnosis not present

## 2018-11-10 DIAGNOSIS — H6121 Impacted cerumen, right ear: Secondary | ICD-10-CM

## 2018-11-17 IMAGING — US US AXILLARY RIGHT
1 series · 4 of 4 positions shown · non-contrast
Comparison: Previous exam(s).

CLINICAL DATA: 55-year-old patient with history of left breast
cancer, status post lumpectomy in [id number]. She palpates thickness in
the 1 o'clock region of the left breast near the lumpectomy scar and
palpates an area of fullness in the right axilla. She had a right
axillary lymph node biopsy in April 2016, which was benign.

EXAM:
2D DIGITAL DIAGNOSTIC BILATERAL MAMMOGRAM WITH CAD AND ADJUNCT TOMO
ULTRASOUND LEFT BREAST AND ULTRASOUND RIGHT AXILLA

[Series 1: us axillary right · 0.08mm/px · 4 of 4 slices shown]
[im 1/4]
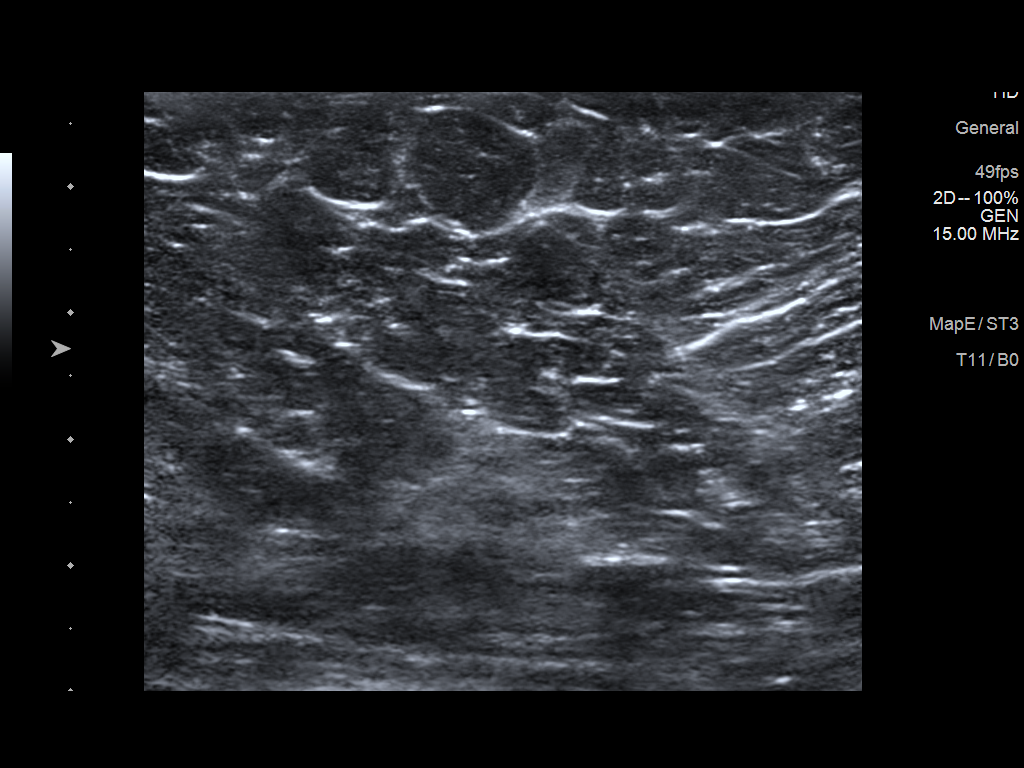
[im 2/4]
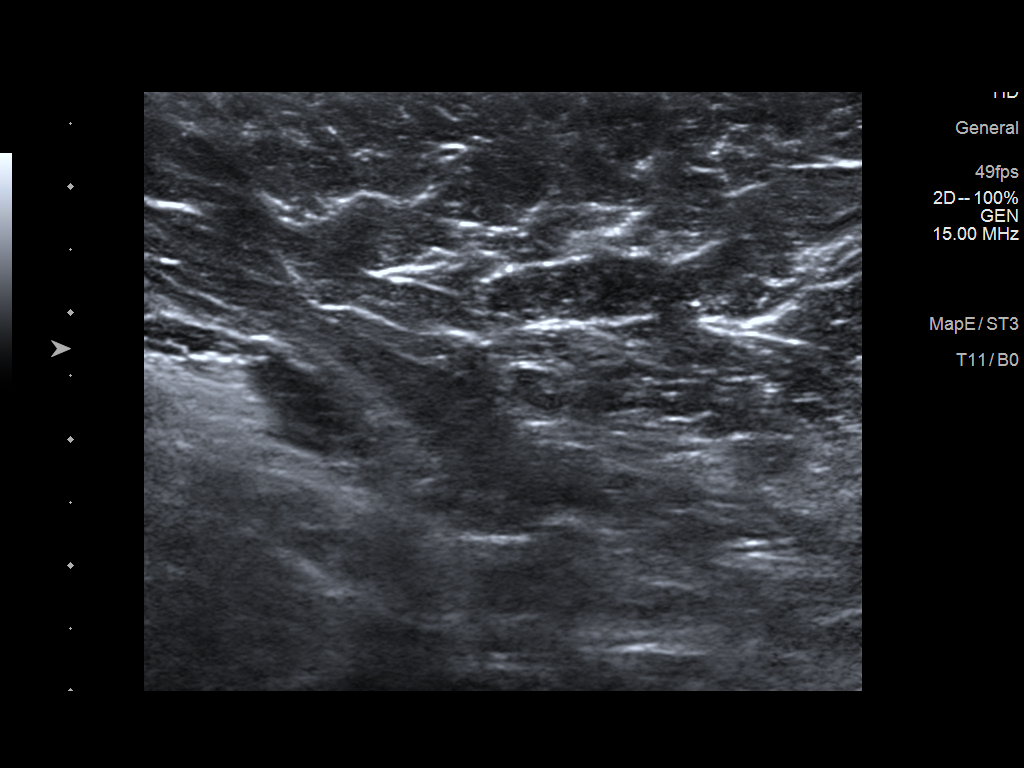
[im 3/4]
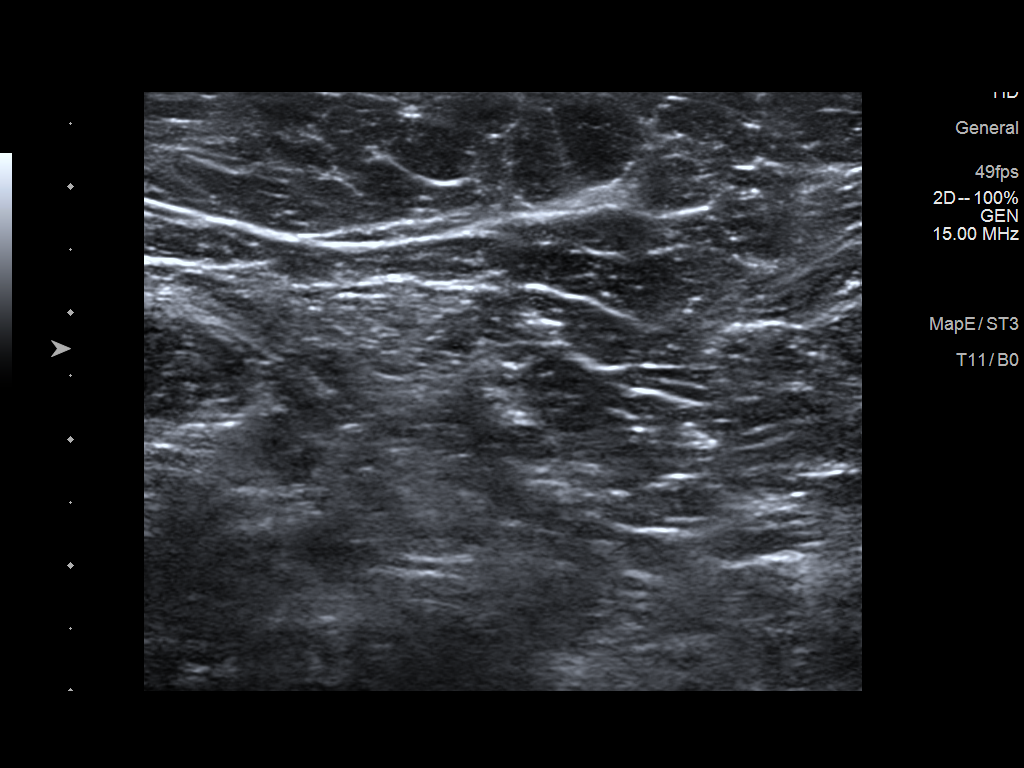
[im 4/4]
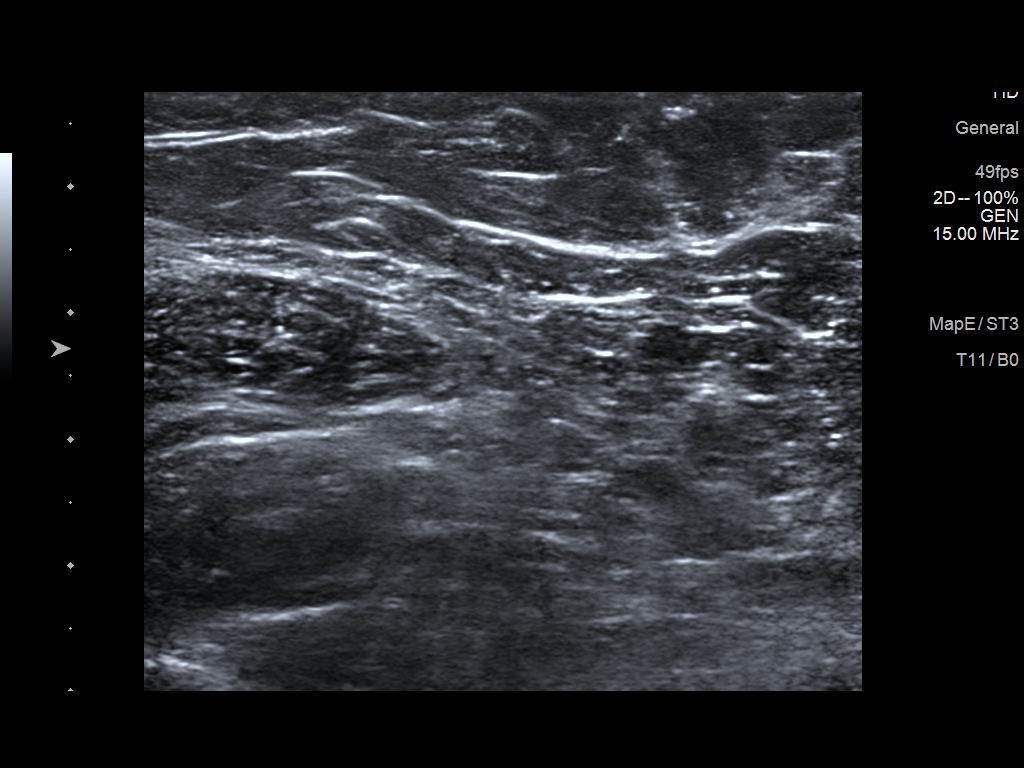

[4 of 4 positions shown; findings below may reference images not displayed]

ACR Breast Density Category b: There are scattered areas of
fibroglandular density.
FINDINGS: Stable lumpectomy changes in the deep upper outer left breast.
Surgical clips are present at the lumpectomy site. Patient's area
palpable concern in the left breast is denoted with a metallic skin
marker and is in the region of the lumpectomy scar. The
magnification view of the lumpectomy scar is stable. No mass,
nonsurgical distortion, or suspicious microcalcification is
identified in either breast.

Mammographic images were processed with CAD.

On physical exam, I palpate firmness in the upper outer left breast
in the region of and medial to the lumpectomy scar. The right axilla
is soft to palpation. No right axillary lymphadenopathy is palpated.

Targeted ultrasound is performed, showing a small postoperative
seroma in the left breast at 2 o'clock position 8 cm from the nipple
in the region of patient concern. The seroma overlies the pectoralis
muscle and measures 2.0 x 1.0 x 0.5 cm. Mild postsurgical scarring
is seen superficial to the seroma in the 2 o'clock region the left
breast. No suspicious findings are identified on ultrasound of the
left breast.

Ultrasound of the right axilla is negative for lymphadenopathy,
fluid collection, or mass. Normal axillary fat is imaged in the
region of patient concern.
IMPRESSION: 1. No evidence of malignancy in the left breast. In the region of
patient concern, stable lumpectomy changes and a small postoperative
seroma are imaged.
2. No evidence malignancy in the right breast.
3. No evidence of right axillary lymphadenopathy or mass. I believe
the patient is palpating normal axillary fat.

RECOMMENDATION:
Diagnostic mammogram is suggested in 1 year. (Code:9F-[id number])

BI-RADS CATEGORY  2: Benign.

## 2019-01-23 ENCOUNTER — Inpatient Hospital Stay: Payer: Medicare HMO | Attending: Adult Health | Admitting: Adult Health

## 2019-01-23 ENCOUNTER — Other Ambulatory Visit: Payer: Self-pay

## 2019-01-23 DIAGNOSIS — Z87891 Personal history of nicotine dependence: Secondary | ICD-10-CM

## 2019-01-23 DIAGNOSIS — M898X9 Other specified disorders of bone, unspecified site: Secondary | ICD-10-CM | POA: Diagnosis not present

## 2019-01-23 DIAGNOSIS — C50412 Malignant neoplasm of upper-outer quadrant of left female breast: Secondary | ICD-10-CM

## 2019-01-23 DIAGNOSIS — M8580 Other specified disorders of bone density and structure, unspecified site: Secondary | ICD-10-CM

## 2019-01-23 DIAGNOSIS — R0789 Other chest pain: Secondary | ICD-10-CM | POA: Diagnosis not present

## 2019-01-23 DIAGNOSIS — M2550 Pain in unspecified joint: Secondary | ICD-10-CM | POA: Diagnosis not present

## 2019-01-23 DIAGNOSIS — M1990 Unspecified osteoarthritis, unspecified site: Secondary | ICD-10-CM | POA: Diagnosis not present

## 2019-01-23 DIAGNOSIS — R609 Edema, unspecified: Secondary | ICD-10-CM | POA: Insufficient documentation

## 2019-01-23 DIAGNOSIS — Z853 Personal history of malignant neoplasm of breast: Secondary | ICD-10-CM | POA: Insufficient documentation

## 2019-01-23 DIAGNOSIS — Z170 Estrogen receptor positive status [ER+]: Secondary | ICD-10-CM

## 2019-01-23 MED ORDER — CLOTRIMAZOLE-BETAMETHASONE 1-0.05 % EX CREA: 1.0000 "application " | TOPICAL_CREAM | Freq: Two times a day (BID) | CUTANEOUS | 0 refills | Status: DC

## 2019-01-23 NOTE — Progress Notes (Signed)
[place]  [phone]) [phone] Fax:(336) [phone]    ID: [name] OB: 1961-01-11  MR#: [id number]  UMP#:[id number]  Patient Care Team: Mikey Kirschner, MD as PCP - General (Family Medicine) Lattie Haw, Cristopher Estimable, MD as Attending Physician (Cardiology) Rogene Houston, MD as Consulting Physician (Gastroenterology) Yisroel Ramming, Everardo All, MD as Consulting Physician (Obstetrics and Gynecology) Magrinat, Virgie Dad, MD as Consulting Physician (Oncology) Thea Silversmith, MD as Consulting Physician (Radiation Oncology) Alphonsa Overall, MD as Consulting Physician (General Surgery) Michael Boston, MD as Consulting Physician (General Surgery) Danie Binder, MD as Consulting Physician (Gastroenterology) Michel Santee, MD as Consulting Physician (Neurology) Jonnie Kind, MD as Consulting Physician (Obstetrics and Gynecology) Nicholes Stairs, MD as Consulting Physician (Orthopedic Surgery) Nunzio Cobbs, MD as Consulting Physician (Obstetrics and Gynecology) Nilda Simmer, NP (Family Medicine) Latanya Maudlin, MD as Consulting Physician (Orthopedic Surgery) OTHER MD:   CHIEF COMPLAINT:  Left Breast Cancer  CURRENT TREATMENT: observation  BREAST CANCER HISTORY: From the original intake note 04/12/2013:  Imunique palpated a mass in her left breast in late August 2014. She was already scheduled for screening mammography at [facility] and this was performed 03/13/2013. Indeed a possible mass was noted in the left breast and on 03/29/2013 the patient underwent diagnostic left mammography and left ultrasonography. This showed a 2.5 cm irregular mass in the upper outer quadrant of the left breast. This was palpable.by ultrasound there was a 1.6 cm irregular hypoechoic mass in the area in question, and in addition to enlarged left axillary lymph nodes were noted, the largest measuring 2.5 cm.  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  Bilateral breast MRI 04/11/2013 shows 3 abnormal enhancing masses in the upper outer quadrant of the left breast measuring altogether 6.4 cm. The largest separate mass measured 2.7 cm. There were abnormal or large lymph nodes in the left axilla. Ultrasound guided biopsy of the 2 smaller masses has been scheduled.  The patient's subsequent history is as detailed below   INTERVAL HISTORY: Sanye returns today for follow-up and treatment of her estrogen receptor positive breast cancer.  She continues on observation alone.  She is seeing her PCP regularly.  She denies any new breast changes.    Since her last visit, she underwent bilateral breast diagnostic mammogram that showed no evidence of malignancy, and breast density category B. An ultrasound was also done.  Patient had several palpable areas of concern, however she notes that today, those areas have resolved.   REVIEW OF SYSTEMS: Dayleen has had increased pain in her joints and bones.  She notes that it is worsening and she is unsure what to do about it.  She also has posterior chest wall pain, and swelling that she has noted.  Sometimes she notes a slight increase in dyspnea.  Otherwise, Rip Harbour is doing well.  She denies any fever, chills, cough, palpitations.  She doesn't have any nausea, vomiting, bowel/bladder changes. A detailed ROS was otherwise non contributory.     PAST MEDICAL HISTORY: Past Medical History:  Diagnosis Date  . Allergic rhinitis   . Anemia,  unspecified 05/30/2013  . Anxiety   . Breast cancer ([place]) 03/31/13   left  . Cancer ([place])   . Chest pain 2009   Consultation-[facility],  negative chest CT; nl echo in 2005; h/o palpitations  . Colitis 2010   not IBD  . Degenerative joint disease    + degenerative joint disease of the lumbosacral spine  . Depression   . Diabetes ([place])   . Diabetes ([place])   . Dysrhythmia    palpations  . GERD (gastroesophageal reflux disease)    "a little"  . Headache   . Heart murmur    "small"  . Herpes simplex type II infection   . Hypercholesteremia    "slightly high"  . Hypertension    does not have high blood pressure  . 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 Palpitations   . Personal history of chemotherapy   . Personal history of radiation therapy   . Wears glasses     PAST SURGICAL HISTORY: Past Surgical History:  Procedure Laterality Date  . ABDOMINAL HYSTERECTOMY  03/13/2007   TAH ?BSO--Dr Levin Bacon, [place]  . BREAST EXCISIONAL BIOPSY  04/2003   Left; benign disease  . BREAST LUMPECTOMY Left 09/2013  . BREAST LUMPECTOMY WITH NEEDLE LOCALIZATION AND AXILLARY LYMPH NODE DISSECTION Left 09/12/2013   Procedure: BREAST LUMPECTOMY WITH NEEDLE LOCALIZATION AND AXILLARY LYMPH NODE DISSECTION;  Surgeon: [name], MD;  Location: [facility];  Service: General;  Laterality: Left;  and axilla  . BREAST SURGERY    . COLONOSCOPY  10/2010   proctitis; melanosis coli  . COLONOSCOPY WITH PROPOFOL N/A 01/04/2018   Dr. [name]: hemorrhoids, follow up colonoscopy in 10 years  . EYE SURGERY Left    "fix lazy eye"  . PORTACATH PLACEMENT Right 04/21/2013   Procedure: INSERTION PORT-A-CATH;  Surgeon: [name], MD;  Location: [facility];  Service: General;  Laterality: Right;  . RIGHT OOPHORECTOMY  03/13/2007  . SUBOCCIPITAL CRANIECTOMY CERVICAL LAMINECTOMY N/A 05/10/2014   Procedure: SUBOCCIPITAL CRANIECTOMY CERVICAL LAMINECTOMY/DURAPLASTY;  Surgeon: [name], MD;  Location: [facility];  Service: Neurosurgery;  Laterality: N/A;  suboccipital craniectomy with cervical laminectomy and duraplasty  . TUBAL LIGATION       FAMILY HISTORY Family History  Problem Relation Age of Onset  . Aneurysm Mother        Cerebral  . Hypertension Mother   . Hyperlipidemia Mother   . Stroke Mother   . Coronary artery disease Father   . Hypertension Father   . Hyperlipidemia Father   . Heart attack Father        d. 36  . Lung cancer Father        dx early 84s; former smoker  . Lung cancer Brother   . Lung cancer Brother        d. 5y; smoker  . Lung cancer Sister        paternal half-sister; not a smoker; dx <60; d. 22y  . Leukemia Maternal Aunt        "blood cancer"; d. unspecified age  . Cervical cancer Paternal Aunt        d. late 79s  . Breast cancer Sister 3  . Other Sister        hx of hysterectomy for unspecified reason  . Multiple myeloma Brother        d. 32s  . Lung cancer Brother        dx late 78s; smoker  . Prostate cancer Brother  dx late 17s  . Prostate cancer Brother        dx late 55s  . Lung cancer Brother   . Lung cancer Maternal Aunt        d. unspecified age  . Breast cancer Maternal Aunt        dx unspecified age  . Prostate cancer Cousin        maternal 1st cousin; dx older age  . Breast cancer Other        niece dx early 25s or before  . Leukemia Other        nephew d. 2y; "blood cancer"  . Colon cancer Neg Hx   . Diabetes Neg Hx   The patient's father died at the age of 2 from a myocardial infarction. The patient's mother died at the age of 64 from a stroke. The patient had 6 brothers, 2 sisters. One brother has a history of lung cancer in the setting of tobacco abuse. Another brother had multiple myeloma. One sister was diagnosed with breast cancer at the age of 57   GYNECOLOGIC HISTORY: (Updated 09/25/2013) Menarche age 64, first live birth age 32, the patient is [place] P4. She status post abdominal hysterectomy with bilateral salpingo-oophorectomy. She never took hormone replacement.   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  Her husband [name] works in Char[employer] fundraiser. Daughter [name] lives at home with the patient with 1 of her 3 children. Son Danett Palazzo lives in [place] and is not employed. Daughter Maggie Schwalbe Dillard lives in [place] and is a Barrister's clerk. Daughter Ernst Spell is a Education officer, museum in [place].  The patient has 10 grandchildren. She attends [employer]     ADVANCED DIRECTIVES: Not in place   HEALTH MAINTENANCE:  (Updated 09/25/2013)  Social History   Tobacco Use  . Smoking status: Former Smoker    Packs/day: 0.50    Years: 15.00    Pack years: 7.50    Types: Cigarettes    Quit date: 07/06/1982    Years since quitting: 36.5  . Smokeless tobacco: Never Used  Substance Use Topics  . Alcohol use: No  . Drug use: No     Colonoscopy:2012  PAP:2014   Bone density: Never  Lipid panel: Not on file    Allergies  Allergen Reactions  . Doxycycline Itching and Swelling    Current Outpatient Medications  Medication Sig Dispense Refill  . acetaminophen (TYLENOL) 500 MG tablet Take 500 mg by mouth every 6 (six) hours as needed for moderate pain.     Marland Kitchen acyclovir (ZOVIRAX) 400 MG tablet Take 1 tablet (400 mg total) by mouth 2 (two) times daily. 60 tablet 11  . ALPRAZolam (XANAX) 1 MG tablet TAKE 1 TABLET BY MOUTH THREE TIMES A DAY AS NEEDED FOR ANXIETY 60 tablet 5  . atenolol (TENORMIN) 50 MG tablet Take 1 tablet (50 mg total) by mouth 2 (two) times daily. 180 tablet 1  . atorvastatin (LIPITOR) 20 MG tablet Take 1 tablet (20 mg total) by mouth daily at 6 PM. 30 tablet 5  . blood glucose meter kit and supplies KIT Dispense based on patient and insurance preference. Use as directed by physician. Diabetes type 2. E11.9 1 each 0  . blood glucose meter kit and supplies Dispense based on patient and insurance preference. Use up to test glucose once daily. For  ICD -10 E11.9. 1 each 0  . EPINEPHrine 0.3  mg/0.3 mL IJ SOAJ injection INJECT 0.3ML INTO MUSCLE ONCE A DAY AS NEEDED FOR ANAPHYLACTIC REACTIONS.  1  . escitalopram (LEXAPRO) 20 MG tablet Take 1 tablet (20 mg total) by mouth daily. 90 tablet 1  . Lancets (ONETOUCH DELICA PLUS [id number]) MISC USE TO TEST BLOOD GLUCOSE DAILY 100 each 5  . metFORMIN (GLUCOPHAGE XR) 500 MG 24 hr tablet Take one tablet by mouth every morning 30 tablet 5  . Multiple Vitamin (MULTIVITAMIN WITH MINERALS) TABS tablet Take 1 tablet by mouth daily.    . ONE TOUCH ULTRA TEST test strip USE TO TEST BLOOD GLUCOSE DAILY 50 each 5  . pantoprazole (PROTONIX) 40 MG tablet Take 1 tablet (40 mg total) by mouth daily before breakfast. 90 tablet 1  . polyethylene glycol powder (MIRALAX) powder TAKE ONCE CAPFUL AT BEDTIME ON DAYS YOU DO NOT HAVE A GOOD BOWEL MOVEMENT 255 g 0  . valACYclovir (VALTREX) 1000 MG tablet Take 1 tablet (1,000 mg total) by mouth daily. 30 tablet 5  . vitamin B-12 (CYANOCOBALAMIN) 1000 MCG tablet Take 1,000 mcg by mouth daily.    . clotrimazole-betamethasone (LOTRISONE) cream Apply 1 application topically 2 (two) times daily. 30 g 0   No current facility-administered medications for this visit.     OBJECTIVE: Vitals:   01/23/19 1455  BP: (!) 114/59  Pulse: (!) 58  Resp: 18  Temp: 98.7 F (37.1 C)  SpO2: 100%  Body mass index is 31.16 kg/m.  ECOG: 1 Filed Weights   01/23/19 1455  Weight: 164 lb 14.4 oz (74.8 kg)   GENERAL: Patient is a well appearing female in no acute distress HEENT:  Sclerae anicteric.  Oropharynx clear and moist. No ulcerations or evidence of oropharyngeal candidiasis. Neck is supple.  NODES:  No cervical, supraclavicular, or axillary lymphadenopathy palpated.  BREAST EXAM:  Deferred. LUNGS:  Clear to auscultation bilaterally.  No wheezes or rhonchi. HEART:  Regular rate and rhythm. No murmur appreciated. ABDOMEN:  Soft, nontender.  Positive, normoactive bowel sounds. No organomegaly palpated. MSK:  No focal spinal  tenderness to palpation. Full range of motion bilaterally in the upper extremities. EXTREMITIES:  No peripheral edema.   SKIN:  Clear with no obvious rashes or skin changes. No nail dyscrasia. NEURO:  Nonfocal. Well oriented.  Appropriate affect.    LAB RESULTS:   Lab Results  Component Value Date   WBC 8.9 06/21/2018   NEUTROABS 5.5 05/19/2018   HGB 12.1 06/21/2018   HCT 35.4 06/21/2018   MCV 84.1 06/21/2018   PLT 219 06/21/2018      Chemistry      Component Value Date/Time   NA 143 05/19/2018 1323   NA 145 (H) 03/18/2018 1046   NA 147 (H) 05/19/2017 1335   K 3.7 05/19/2018 1323   K 4.0 05/19/2017 1335   CL 107 05/19/2018 1323   CO2 28 05/19/2018 1323   CO2 28 05/19/2017 1335   BUN 12 05/19/2018 1323   BUN 12 03/18/2018 1046   BUN 8.2 05/19/2017 1335   CREATININE 0.90 08/15/2018 1347   CREATININE 1.03 06/21/2018 1403   CREATININE 0.9 05/19/2017 1335      Component Value Date/Time   CALCIUM 9.2 05/19/2018 1323   CALCIUM 9.1 05/19/2017 1335   ALKPHOS 54 05/19/2018 1323   ALKPHOS 41 05/19/2017 1335   AST 13 (L) 05/19/2018 1323   AST 16 05/19/2017 1335   ALT 11 05/19/2018 1323  ALT 14 05/19/2017 1335   BILITOT 0.4 05/19/2018 1323   BILITOT 0.3 03/18/2018 1046   BILITOT 0.36 05/19/2017 1335         STUDIES: No results found.   ASSESSMENT/PLAN: 58 y.o. [place], [place] woman   (1)  status post left breast upper outer quadrant biopsy 03/29/2013 for a clinical T2-T3 pN1, stage IIB-IIIA invasive ductal carcinoma, grade 2-3, estrogen receptor 39% positive (with moderate staining intensity), progesterone receptor negative, with an MIB-1 of 83%, and HER-2 amplified by CISH, with a ratio of 7.5 and 15 HER-2 copies per cell  (2) neoadjuvant chemotherapy, with trastuzumab/ pertuzumab/ carboplatin/ docetaxel repeated every 3 weeks x6, completed 08/21/2013. Pertuzumab  held after 4 cycles secondary to diarrhea. The patient received Neulasta on day 2 at [facility]  per her request.   (3) Trastuzumab continued for total of one year (last dose 04/13/2014)). Most recent echo 06/04/2014 showed a well preserved ejection fraction.  (4)  status post left lumpectomy and axillary node dissection 09/12/2013, with a complete pathologic response (a total of 16 lymph nodes were examined,1 sentinel and 15 non-sentinel),  YpT0, ypN0.  (5) adjuvant radiation at [facility] completed 12/08/2013  (6) started anastrozole 01/16/2014  (a) bone density 04/09/2016 shows osteopenia with a T score of -1.7   (b) switched to tamoxifen as of OCT 2017 because of vaginal dryness issues   (i) status post remote total abdominal hysterectomy with right salpingo-oophorectomy  (c) pelvic and transvaginal ultrasound found a normal left ovary, vaginal cuff appeared normal, with no area of suspicious attachment of the bowel to the cuff apex  (d) tamoxifen discontinued November 2019  (7)  genetics testing 06/22/2016 through the 21-gene Custom Cancer Panel through [employer], MD) found no deleterious mutations in ATM, BARD1, BRCA1, BRCA2, BRIP1, CDH1, CHEK2, EPCAM, FANCC, HOXB13, MLH1, MSH2, MSH6, NBN, PALB2, PMS2, PTEN, RAD51C, RAD51D, TP53, and XRCC2.    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    PLAN: Najwa is doing moderately well.  She is very concerned about possible recurrence due to her pain.  I ordered a CT chest and bone scan to evaluate.  She was recommended to take tylenol if needed for her pain.    I recommended healthy diet and exercise 30 minutes most days of the week.  She will be due for her regular mammogram in 08/2019.    Tenise will return in 1 year for follow up.  She was recommended to continue with the appropriate pandemic precautions. She knows to call for any questions or concerns that may arise between now and her next appointment.    A total of (30) minutes of  face-to-face time was spent with this patient with greater than 50% of that time in counseling and care-coordination.   Wilber Bihari, NP  01/24/19 4:01 PM Medical Oncology and Hematology [facility] [address] [place], [place] [address] Tel. [phone]    Fax. [phone]

## 2019-01-24 ENCOUNTER — Encounter: Payer: Self-pay | Admitting: Adult Health

## 2019-01-24 ENCOUNTER — Telehealth: Payer: Self-pay | Admitting: Adult Health

## 2019-01-24 NOTE — Telephone Encounter (Signed)
I left a message regarding schedule  

## 2019-01-26 ENCOUNTER — Other Ambulatory Visit: Payer: Self-pay

## 2019-01-26 ENCOUNTER — Ambulatory Visit: Payer: Medicare HMO | Attending: Adult Health | Admitting: Physical Therapy

## 2019-01-26 ENCOUNTER — Encounter: Payer: Self-pay | Admitting: Physical Therapy

## 2019-01-26 DIAGNOSIS — R293 Abnormal posture: Secondary | ICD-10-CM

## 2019-01-26 DIAGNOSIS — M25612 Stiffness of left shoulder, not elsewhere classified: Secondary | ICD-10-CM | POA: Diagnosis not present

## 2019-01-26 DIAGNOSIS — I890 Lymphedema, not elsewhere classified: Secondary | ICD-10-CM | POA: Insufficient documentation

## 2019-01-26 DIAGNOSIS — G8929 Other chronic pain: Secondary | ICD-10-CM

## 2019-01-26 DIAGNOSIS — M6281 Muscle weakness (generalized): Secondary | ICD-10-CM | POA: Diagnosis not present

## 2019-01-26 DIAGNOSIS — L599 Disorder of the skin and subcutaneous tissue related to radiation, unspecified: Secondary | ICD-10-CM | POA: Diagnosis not present

## 2019-01-26 DIAGNOSIS — M25512 Pain in left shoulder: Secondary | ICD-10-CM | POA: Diagnosis not present

## 2019-01-26 NOTE — Therapy (Signed)
[place], [place], [address] Phone: [phone]   Fax:  [phone]  Physical Therapy Evaluation  Patient Details  Name: [name] [name] MRN: [id number] Date of Birth: Jan 16, 1961 Referring Provider (PT): Thedore Mins   Encounter Date: 01/26/2019  PT End of Session - 01/26/19 1443    Visit Number  1    Number of Visits  9    Date for PT Re-Evaluation  03/13/19    PT Start Time  1400    PT Stop Time  1435    PT Time Calculation (min)  35 min    Activity Tolerance  Patient tolerated treatment well    Behavior During Therapy  [facility] for tasks assessed/performed       Past Medical History:  Diagnosis Date  . Allergic rhinitis   . Anemia, unspecified 05/30/2013  . Anxiety   . Breast cancer ([place]) 03/31/13   left  . Cancer ([place])   . Chest pain 2009   Consultation-[place], negative chest CT; nl echo in 2005; h/o palpitations  . Colitis 2010   not IBD  . Degenerative joint disease    + degenerative joint disease of the lumbosacral spine  . Depression   . Diabetes ([place])   . Diabetes ([place])   . Dysrhythmia    palpations  . GERD (gastroesophageal reflux disease)    "a little"  . Headache   . Heart murmur    "small"  . Herpes simplex type II infection   . Hypercholesteremia    "slightly high"  . Hypertension    does not have high blood pressure  . 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 Palpitations   . Personal history of chemotherapy   . Personal history of radiation therapy   . Wears glasses     Past Surgical History:  Procedure Laterality Date  . ABDOMINAL HYSTERECTOMY  03/13/2007   TAH ?BSO--Dr Levin Bacon, [place]  . BREAST EXCISIONAL BIOPSY  04/2003   Left; benign disease  . BREAST LUMPECTOMY Left 09/2013  . BREAST LUMPECTOMY WITH NEEDLE LOCALIZATION AND AXILLARY LYMPH NODE DISSECTION Left 09/12/2013   Procedure: BREAST LUMPECTOMY WITH NEEDLE LOCALIZATION AND AXILLARY LYMPH NODE DISSECTION;   Surgeon: [name], MD;  Location: [facility];  Service: General;  Laterality: Left;  and axilla  . BREAST SURGERY    . COLONOSCOPY  10/2010   proctitis; melanosis coli  . COLONOSCOPY WITH PROPOFOL N/A 01/04/2018   Dr. [name]: hemorrhoids, follow up colonoscopy in 10 years  . EYE SURGERY Left    "fix lazy eye"  . PORTACATH PLACEMENT Right 04/21/2013   Procedure: INSERTION PORT-A-CATH;  Surgeon: [name], MD;  Location: [facility];  Service: General;  Laterality: Right;  . RIGHT OOPHORECTOMY  03/13/2007  . SUBOCCIPITAL CRANIECTOMY CERVICAL LAMINECTOMY N/A 05/10/2014   Procedure: SUBOCCIPITAL CRANIECTOMY CERVICAL LAMINECTOMY/DURAPLASTY;  Surgeon: [name], MD;  Location: [facility];  Service: Neurosurgery;  Laterality: N/A;  suboccipital craniectomy with cervical laminectomy and duraplasty  . TUBAL LIGATION      There were no vitals filed for this visit.   Subjective Assessment - 01/26/19 1401    Subjective  I started noticing the swelling about two weeks ago. The arm has been causing me problems now for about a month. It tingles and feels like I need to wake it up. I have been having fullness in my side since the surgery.    Pertinent History  Left lumpectomy and ALND on 04/13/2014, 16  nodes removed, pt completed chemo and radiation, diabetes, hx of meningitis, hx of brain sugery    Patient Stated Goals  to know if this is lymphedema    Currently in Pain?  No/denies         [facility] PT Assessment - 01/26/19 0001      Assessment   Medical Diagnosis  left breast cancer    Referring Provider (PT)  Thedore Mins    Onset Date/Surgical Date  04/13/14    Hand Dominance  Right    Prior Therapy  none      Precautions   Precautions  Other (comment)    Precaution Comments  lymphedema      Restrictions   Weight Bearing Restrictions  No      Balance Screen   Has the patient fallen in the past 6 months  No    Has the patient had a decrease in activity level  because of a fear of falling?   No    Is the patient reluctant to leave their home because of a fear of falling?   No      Home Environment   Living Environment  Private residence    Living Arrangements  Spouse/significant other;Children   children 12 and 5, 30   Available Help at Discharge  Family    Type of [place]      Prior Function   Level of [place]  On disability    Leisure  pt reports she exercised prior to Covid, pt went to the [facility], pt will return when they reopen      Cognition   Overall Cognitive Status  Within Functional Limits for tasks assessed      Observation/Other Assessments   Observations  left lateral trunk more full and left supraclavicular area more full      ROM / Strength   AROM / PROM / Strength  AROM      AROM   AROM Assessment Site  Shoulder    Right/Left Shoulder  Right;Left    Right Shoulder Flexion  154 Degrees    Right Shoulder ABduction  160 Degrees    Right Shoulder Internal Rotation  70 Degrees    Right Shoulder External Rotation  75 Degrees    Left Shoulder Flexion  147 Degrees    Left Shoulder ABduction  126 Degrees    Left Shoulder Internal Rotation  42 Degrees    Left Shoulder External Rotation  75 Degrees        LYMPHEDEMA/ONCOLOGY QUESTIONNAIRE - 01/26/19 1416      Type   Cancer Type  left breast cancer      Surgeries   Lumpectomy Date  04/13/14    Axillary Lymph Node Dissection Date  04/13/14    Number Lymph Nodes Removed  16      Date Lymphedema/Swelling Started   Date  04/13/14      Treatment   Active Chemotherapy Treatment  No    Past Chemotherapy Treatment  Yes    Active Radiation Treatment  No    Past Radiation Treatment  Yes    Current Hormone Treatment  No    Past Hormone Therapy  Yes      What other symptoms do you have   Are you Having Heaviness or Tightness  Yes    Are you having Pain  Yes    Are you having pitting edema  No    Is it Hard  or Difficult finding clothes  that fit  No    Do you have infections  No    Is there Decreased scar mobility  No      Lymphedema Assessments   Lymphedema Assessments  Upper extremities      Right Upper Extremity Lymphedema   15 cm Proximal to Olecranon Process  33.5 cm    Olecranon Process  25.3 cm    15 cm Proximal to Ulnar Styloid Process  24.5 cm    Just Proximal to Ulnar Styloid Process  15 cm    Across Hand at [employer]  19 cm    At [place] of 2nd Digit  5.9 cm      Left Upper Extremity Lymphedema   15 cm Proximal to Olecranon Process  32 cm    Olecranon Process  24.5 cm    15 cm Proximal to Ulnar Styloid Process  24 cm    Just Proximal to Ulnar Styloid Process  15.7 cm    Across Hand at [employer]  18.4 cm    At [place] of 2nd Digit  6 cm             Outpatient Re[facility]26/[facility]  Lymphedema Life Impact Scale Total Score  16.18 %      Objective measurements completed on examination: See above findings.      [place] Adult PT Treatment/Exercise - 01/26/19 0001      Manual Therapy   Manual Therapy  Edema management    Edema Management  created foam chip pack for pt to wear in her bra for management of  L lateral trunk swelling             PT Education - 01/26/19 1443    Education Details  anatomy and physiology of lymphatic system, post op breast exercises for ROM, chip pack and benefits of compression bra    Person(s) Educated  Patient    Methods  Explanation;Handout    Comprehension  Verbalized understanding          PT Long Term Goals - 01/26/19 1449      PT LONG TERM GOAL #1   Title  Pt will demonstrate 154 degrees of left shoulder flexion to allow her to reach overhead    Baseline  147    Time  4    Period  Weeks    Status  New    Target Date  03/13/19      PT LONG TERM GOAL #2   Title  Pt will demonstrate 155 degrees of left shoulder abduction to allow her to reach out to the side    Baseline  126    Time  4     Period  Weeks    Status  New    Target Date  03/13/19      PT LONG TERM GOAL #3   Title  Pt will be independent in self MLD for long term management of lymphedema    Time  4    Period  Weeks    Status  New    Target Date  03/13/19      PT LONG TERM GOAL #4   Title  Pt will obtain a compression bra for long term management of left lateral trunk lymphedema    Time  4    Period  Weeks    Status  New    Target Date  03/13/19  PT LONG TERM GOAL #5   Title  Pt will be independent in a home exercise program for continued strengthening and stretching    Time  4    Period  Weeks    Status  New    Target Date  03/13/19             Plan - 01/26/19 1444    Clinical Impression Statement  Pt presents to PT with decreased left shoulder ROM and tightness as well as left lateral trunk and supraclavicular fossa lymphedema. Pt underwent a left lumpectomy and ALND in Oct 2015 and has completed chemo and radiation. She reports that she has had swelling in her left lateral trunk since that time and in the last month developed swelling in her supraclavicular fossa. Pt would benefit from skilled PT services to decrease lymphedema, progress pt towards independence with managing swelling, and improve L shoulder ROM.    Personal Factors and Comorbidities  Comorbidity 1;Comorbidity 2;Time since onset of injury/illness/exacerbation    Comorbidities  brain surgery, diabetes    Examination-Activity Limitations  Caring for Others;Reach Overhead;Carry    Examination-Participation Restrictions  Yard Work;Laundry;Meal Prep    Stability/Clinical Decision Making  Stable/Uncomplicated    Clinical Decision Making  Low    Rehab Potential  Good    PT Frequency  2x / week   starting 02/13/19 because her child needs surgery   PT Duration  4 weeks    PT Treatment/Interventions  ADLs/Self Care Home Management;Therapeutic activities;Therapeutic exercise;Patient/family education;Manual techniques;Manual lymph  drainage;Compression bandaging;Passive range of motion;Taping;Scar mobilization;Vasopneumatic Device    PT Next Visit Plan  begin MLD to left lateral trunk, instruct and issue handout, work on supraclavicular area, give dowel exercises, PROM to L shoulder    PT Home Exercise Plan  post op breast exercises, educated pt not to do wall walking due to pain, wear chip pack    Consulted and Agree with Plan of Care  Patient       Patient will benefit from skilled therapeutic intervention in order to improve the following deficits and impairments:  Decreased range of motion, Increased edema, Decreased knowledge of precautions, Increased fascial restricitons, Impaired UE functional use, Postural dysfunction, Decreased strength  Visit Diagnosis: 1. Lymphedema, not elsewhere classified   2. Stiffness of left shoulder, not elsewhere classified   3. Chronic left shoulder pain   4. Muscle weakness (generalized)   5. Abnormal posture   6. Disorder of the skin and subcutaneous tissue related to radiation, unspecified        Problem List Patient Active Problem List   Diagnosis Date Noted  . Bloating 06/01/2018  . 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 Rectal bleeding 10/26/2017  . RUQ pain 10/21/2017  . LLQ pain 10/21/2017  . Nausea without vomiting 10/21/2017  . Paresthesias 05/14/2017  . Hyperlipidemia associated with type 2 diabetes mellitus ([place]) 03/22/2017  . Genetic testing 07/01/2016  . Lipoma of back 06/03/2016  . Reactive hypoglycemia 06/03/2016  . Family history of breast cancer in female 05/22/2016  . Diabetes ([place])   . IBS (irritable bowel syndrome) 12/10/2015  . Depression 08/23/2014  . Anxiety 08/23/2014  . Heart murmur 08/23/2014  . Hypercholesteremia 08/23/2014  . History of Chiari malformation 08/23/2014  . 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 Fever   . Vaginal dryness 07/16/2014  . 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 Joint pain 10/02/2013  . Neuropathic pain 10/02/2013  .  Onychomycosis of toenail 10/02/2013  . Frequent headaches 06/19/2013  . Constipation 06/19/2013  .  Anxiety as acute reaction to exceptional stress 05/22/2013  . Malignant neoplasm of upper-outer quadrant of left breast in female, estrogen receptor positive ([place]) 04/06/2013  . 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 Obesity (BMI 30-39.9) 12/14/2012  . GERD (gastroesophageal reflux disease) 12/14/2012  . Chronic depression 12/14/2012  . Heart murmur, systolic 13/88/7195  . Essential hypertension 09/02/2012  . Chest pain   . Palpitations   . DEGENERATIVE JOINT DISEASE, RIGHT KNEE 10/03/2009  . [place] DEGENERATION 10/24/2007    Allyson Sabal [facility] 01/26/2019, 2:52 PM  [place], [place], [address] Phone: [phone]   Fax:  [phone]  Name: [name] [name] MRN: [id number] Date of Birth: 08-Sep-1960  Manus Gunning, PT 01/26/19 2:53 PM

## 2019-01-27 ENCOUNTER — Other Ambulatory Visit: Payer: Self-pay | Admitting: Adult Health

## 2019-01-27 DIAGNOSIS — Z170 Estrogen receptor positive status [ER+]: Secondary | ICD-10-CM

## 2019-01-27 DIAGNOSIS — C50412 Malignant neoplasm of upper-outer quadrant of left female breast: Secondary | ICD-10-CM

## 2019-01-31 ENCOUNTER — Encounter (HOSPITAL_COMMUNITY)
Admission: RE | Admit: 2019-01-31 | Discharge: 2019-01-31 | Disposition: A | Discharge status: Home / Self Care | Payer: Medicare HMO | Source: Ambulatory Visit | Attending: Adult Health | Admitting: Adult Health

## 2019-01-31 ENCOUNTER — Ambulatory Visit (HOSPITAL_COMMUNITY)
Admission: RE | Admit: 2019-01-31 | Discharge: 2019-01-31 | Disposition: A | Discharge status: Home / Self Care | Payer: Medicare HMO | Source: Ambulatory Visit | Attending: Adult Health | Admitting: Adult Health

## 2019-01-31 ENCOUNTER — Encounter (HOSPITAL_COMMUNITY): Payer: Self-pay

## 2019-01-31 ENCOUNTER — Other Ambulatory Visit: Payer: Self-pay

## 2019-01-31 DIAGNOSIS — C50919 Malignant neoplasm of unspecified site of unspecified female breast: Secondary | ICD-10-CM | POA: Diagnosis not present

## 2019-01-31 DIAGNOSIS — C50412 Malignant neoplasm of upper-outer quadrant of left female breast: Secondary | ICD-10-CM | POA: Diagnosis present

## 2019-01-31 DIAGNOSIS — C50912 Malignant neoplasm of unspecified site of left female breast: Secondary | ICD-10-CM | POA: Diagnosis not present

## 2019-01-31 DIAGNOSIS — Z170 Estrogen receptor positive status [ER+]: Secondary | ICD-10-CM | POA: Insufficient documentation

## 2019-01-31 DIAGNOSIS — R0602 Shortness of breath: Secondary | ICD-10-CM | POA: Diagnosis not present

## 2019-01-31 LAB — POCT I-STAT CREATININE: Creatinine, Ser: 0.9 mg/dL (ref 0.44–1.00)

## 2019-01-31 MED ORDER — TECHNETIUM TC 99M MEDRONATE IV KIT
22.0000 | PACK | Freq: Once | INTRAVENOUS | Status: AC | PRN
  Administered 2019-01-31: 22 via INTRAVENOUS

## 2019-01-31 MED ORDER — SODIUM CHLORIDE (PF) 0.9 % IJ SOLN
INTRAMUSCULAR | Status: AC
  Filled 2019-01-31: qty 50

## 2019-01-31 MED ORDER — IOHEXOL 300 MG/ML  SOLN
75.0000 mL | Freq: Once | INTRAMUSCULAR | Status: AC | PRN
  Administered 2019-01-31: 75 mL via INTRAVENOUS

## 2019-02-01 ENCOUNTER — Telehealth: Payer: Self-pay | Admitting: Adult Health

## 2019-02-01 NOTE — Telephone Encounter (Signed)
Called and [facility] about CT scan and bone scan results.  Requested she call us back.   Wilber Bihari, NP

## 2019-02-06 ENCOUNTER — Telehealth: Payer: Self-pay

## 2019-02-06 ENCOUNTER — Telehealth: Payer: Self-pay | Admitting: Adult Health

## 2019-02-06 NOTE — Telephone Encounter (Signed)
Patient came by center to inquire about recent scans.  Scans printed off and gave to patient.  Patient would like a call from NP to review scans.  NP aware.

## 2019-02-06 NOTE — Telephone Encounter (Signed)
Called patient and reviewed CT scan and bone scan with her.  I reviewed that CT scan shows no evidence of cancer.  Bone scan shows one area of concern in left greater trochanter.  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  This has been going on for the past 10 years.  I recommended she let us know if the pain increases.  Reviewed the rest of the scan that shows no areas of concern, just degeneration.  Patient notes the lotrisone cream has taken away the itch from underneath her breast and she is most appreciative.  Dalia was reminded to call us with any questions or concerns between now and her next appointment.  Time Spent: 5 minutes   Wilber Bihari, NP

## 2019-02-14 ENCOUNTER — Ambulatory Visit: Payer: Medicare HMO | Admitting: Physical Therapy

## 2019-02-14 DIAGNOSIS — M25512 Pain in left shoulder: Secondary | ICD-10-CM | POA: Insufficient documentation

## 2019-02-14 DIAGNOSIS — M25552 Pain in left hip: Secondary | ICD-10-CM | POA: Insufficient documentation

## 2019-02-15 DIAGNOSIS — M7542 Impingement syndrome of left shoulder: Secondary | ICD-10-CM | POA: Diagnosis not present

## 2019-02-15 DIAGNOSIS — M25512 Pain in left shoulder: Secondary | ICD-10-CM | POA: Diagnosis not present

## 2019-02-15 DIAGNOSIS — M25552 Pain in left hip: Secondary | ICD-10-CM | POA: Diagnosis not present

## 2019-02-16 ENCOUNTER — Ambulatory Visit: Payer: Medicare HMO | Admitting: Physical Therapy

## 2019-02-21 ENCOUNTER — Encounter: Payer: Medicare HMO | Admitting: Physical Therapy

## 2019-02-22 DIAGNOSIS — M25552 Pain in left hip: Secondary | ICD-10-CM | POA: Diagnosis not present

## 2019-02-23 ENCOUNTER — Encounter: Payer: Medicare HMO | Admitting: Physical Therapy

## 2019-02-28 ENCOUNTER — Encounter: Payer: Medicare HMO | Admitting: Physical Therapy

## 2019-03-01 DIAGNOSIS — M25552 Pain in left hip: Secondary | ICD-10-CM | POA: Diagnosis not present

## 2019-03-01 DIAGNOSIS — M7542 Impingement syndrome of left shoulder: Secondary | ICD-10-CM | POA: Diagnosis not present

## 2019-03-02 ENCOUNTER — Encounter: Payer: Medicare HMO | Admitting: Physical Therapy

## 2019-03-06 ENCOUNTER — Encounter (HOSPITAL_COMMUNITY): Payer: Self-pay | Admitting: Occupational Therapy

## 2019-03-06 ENCOUNTER — Other Ambulatory Visit: Payer: Self-pay

## 2019-03-06 ENCOUNTER — Ambulatory Visit (HOSPITAL_COMMUNITY): Payer: Medicare HMO | Attending: Orthopedic Surgery | Admitting: Occupational Therapy

## 2019-03-06 DIAGNOSIS — M25512 Pain in left shoulder: Secondary | ICD-10-CM | POA: Diagnosis not present

## 2019-03-06 DIAGNOSIS — M25612 Stiffness of left shoulder, not elsewhere classified: Secondary | ICD-10-CM | POA: Diagnosis not present

## 2019-03-06 DIAGNOSIS — R29898 Other symptoms and signs involving the musculoskeletal system: Secondary | ICD-10-CM

## 2019-03-06 NOTE — Patient Instructions (Signed)
1) Flexion Wall Stretch    Face wall, place affected handon wall in front of you. Slide hand up the wall  and lean body in towards the wall. Hold for 10 seconds. Repeat 3-5 times. 1-2 times/day.     2) Towel Stretch with Internal Rotation      Gently pull up (or to the side) your affected arm  behind your back with the assist of a towel. Hold 10 seconds, repeat 3-5 times. 1-2 times/day.             3) Corner Stretch    Stand at a corner of a wall, place your arms on the walls with elbows bent. Lean into the corner until a stretch is felt along the front of your chest and/or shoulders. Hold for 10 seconds. Repeat 3-5X, 1-2 times/day.    4) Posterior Capsule Stretch    Bring the involved arm across chest. Grasp elbow and pull toward chest until you feel a stretch in the back of the upper arm and shoulder. Hold 10 seconds. Repeat 3-5X. Complete 1-2 times/day.    5) Scapular Retraction    Tuck chin back as you pinch shoulder blades together.  Hold 5 seconds. Repeat 3-5X. Complete 1-2 times/day.    6) External Rotation Stretch:     Place your affected hand on the wall with the elbow bent and gently turn your body the opposite direction until a stretch is felt. Hold 10 seconds, repeat 3-5X. Complete 1-2 times/day.      Perform each exercise __10-15______ reps. 2-3x days.   1) Protraction   Start by holding a wand or cane at chest height.  Next, slowly push the wand outwards in front of your body so that your elbows become fully straightened. Then, return to the original position.     2) Shoulder FLEXION  In the standing position, hold a wand/cane with both arms, palms down on both sides. Raise up the wand/cane allowing your unaffected arm to perform most of the effort. Your affected arm should be partially relaxed.      3) Internal/External ROTATION  In the standing position, hold a wand/cane with both hands keeping your elbows bent. Move your arms  and wand/cane to one side.  Your affected arm should be partially relaxed while your unaffected arm performs most of the effort.       4) Shoulder ABDUCTION   While holding a wand/cane palm face up on the injured side and palm face down on the uninjured side, slowly raise up your injured arm to the side.                     5) Horizontal Abduction/Adduction      Straight arms holding cane at shoulder height, bring cane to right, center, left. Repeat starting to left.   Copyright  VHI. All rights reserved.      Repeat all exercises 10-15 times, 1-2 times per day.  1) Shoulder Protraction    Begin with elbows by your side, slowly "punch" straight out in front of you.      2) Shoulder Flexion  Supine:     Standing:         Begin with arms at your side with thumbs pointed up, slowly raise both arms up and forward towards overhead.               3) Horizontal abduction/adduction  Supine:   Standing:  Begin with arms straight out in front of you, bring out to the side in at "T" shape. Keep arms straight entire time.                 4) Internal & External Rotation    *No band* -Stand with elbows at the side and elbows bent 90 degrees. Move your forearms away from your body, then bring back inward toward the body.     5) Shoulder Abduction  Supine:     Standing:       Lying on your back begin with your arms flat on the table next to your side. Slowly move your arms out to the side so that they go overhead, in a jumping jack or snow angel movement.

## 2019-03-06 NOTE — Therapy (Signed)
[facility] [address] [place], [place], [address] Phone: [phone]   Fax:  [phone]  Occupational Therapy Evaluation  Patient Details  Name: [name] [name] MRN: [id number] Date of Birth: 1960/07/25 Referring Provider (OT): Dr. Victorino December   Encounter Date: 03/06/2019  OT End of Session - 03/06/19 1604    Visit Number  1    Number of Visits  1    Date for OT Re-Evaluation  03/07/19    Authorization Type  [employer] HMO    Authorization Time Period  visits based on medical necessity    OT Start Time  1435    OT Stop Time  1509    OT Time Calculation (min)  34 min    Activity Tolerance  Patient tolerated treatment well    Behavior During Therapy  [facility] for tasks assessed/performed       Past Medical History:  Diagnosis Date  . Allergic rhinitis   . Anemia, unspecified 05/30/2013  . Anxiety   . Breast cancer ([place]) 03/31/13   left  . Cancer ([place])   . Chest pain 2009   Consultation-[facility], negative chest CT; nl echo in 2005; h/o palpitations  . Colitis 2010   not IBD  . Degenerative joint disease    + degenerative joint disease of the lumbosacral spine  . Depression   . Diabetes ([place])   . Diabetes ([place])   . Dysrhythmia    palpations  . GERD (gastroesophageal reflux disease)    "a little"  . Headache   . Heart murmur    "small"  . Herpes simplex type II infection   . Hypercholesteremia    "slightly high"  . Hypertension    does not have high blood pressure  . 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 Palpitations   . Personal history of chemotherapy   . Personal history of radiation therapy   . Wears glasses     Past Surgical History:  Procedure Laterality Date  . ABDOMINAL HYSTERECTOMY  03/13/2007   TAH ?BSO--Dr Levin Bacon, [place]  . BREAST EXCISIONAL BIOPSY  04/2003   Left; benign disease  . BREAST LUMPECTOMY Left 09/2013  . BREAST LUMPECTOMY WITH NEEDLE LOCALIZATION AND AXILLARY LYMPH NODE DISSECTION Left  09/12/2013   Procedure: BREAST LUMPECTOMY WITH NEEDLE LOCALIZATION AND AXILLARY LYMPH NODE DISSECTION;  Surgeon: [name], MD;  Location: [facility];  Service: General;  Laterality: Left;  and axilla  . BREAST SURGERY    . COLONOSCOPY  10/2010   proctitis; melanosis coli  . COLONOSCOPY WITH PROPOFOL N/A 01/04/2018   Dr. [name]: hemorrhoids, follow up colonoscopy in 10 years  . EYE SURGERY Left    "fix lazy eye"  . PORTACATH PLACEMENT Right 04/21/2013   Procedure: INSERTION PORT-A-CATH;  Surgeon: [name], MD;  Location: [facility];  Service: General;  Laterality: Right;  . RIGHT OOPHORECTOMY  03/13/2007  . SUBOCCIPITAL CRANIECTOMY CERVICAL LAMINECTOMY N/A 05/10/2014   Procedure: SUBOCCIPITAL CRANIECTOMY CERVICAL LAMINECTOMY/DURAPLASTY;  Surgeon: [name], MD;  Location: [facility];  Service: Neurosurgery;  Laterality: N/A;  suboccipital craniectomy with cervical laminectomy and duraplasty  . TUBAL LIGATION      There were no vitals filed for this visit.  Subjective Assessment - 03/06/19 1538    Subjective   S: It began gradually hurting more and more.    Pertinent History  Pt is a 58 y/o female presenting with left shoulder pain and suspected impingement that began approximately 2 months  ago. Pt did receive and injection 2 weeks ago however no improvement noted. Per chart review pt has lost ROM since lymphedema assessment in July. Pt was referred to occupational therapy for evaluation and treatment by Dr. Victorino December.    Special Tests  FOTO: 56/100    Patient Stated Goals  To have less pain in my shoulder and arm.    Currently in Pain?  Yes    Pain Score  5     Pain Location  Shoulder    Pain Orientation  Left    Pain Descriptors / Indicators  Aching;Sore    Pain Type  Acute pain    Pain Radiating Towards  shoulder blade    Pain Onset  More than a month ago    Pain Frequency  Constant    Aggravating Factors   movement, stretching    Pain Relieving Factors   rest    Effect of Pain on Daily Activities  min effect on ADLs-pt pushes through the pain    Multiple Pain Sites  No        [facility] OT Assessment - 03/06/19 1436      Assessment   Medical Diagnosis  left shoulder impingement    Referring Provider (OT)  Dr. Victorino December    Onset Date/Surgical Date  01/03/19    Hand Dominance  Right    Prior Therapy  none      Precautions   Precautions  Other (comment)    Precaution Comments  lymphedema      Restrictions   Weight Bearing Restrictions  No      Balance Screen   Has the patient fallen in the past 6 months  No      Prior Function   Level of Independence  Independent    Vocation  On disability    Leisure  pt reports she exercised prior to Covid, pt went to the [facility], pt will return when they reopen      ADL   ADL comments  Pt is having difficulty with reaching behind back for bra hook, sleeping. Pt reports tingling and numbness along the left side of her body occasionally lasting for only a few seconds.       Written Expression   Dominant Hand  Right      Cognition   Overall Cognitive Status  Within Functional Limits for tasks assessed      Observation/Other Assessments   Focus on Therapeutic Outcomes (FOTO)   56/100      ROM / Strength   AROM / PROM / Strength  AROM;Strength;PROM      Palpation   Palpation comment  Mod to max fascial restrictions and tenderness in left upper arm, anterior shoulder, trapezius, and scapularis regions      AROM   Overall AROM Comments  Assessed seated, er/IR adducted    AROM Assessment Site  Shoulder    Right/Left Shoulder  Left    Left Shoulder Flexion  115 Degrees    Left Shoulder ABduction  122 Degrees    Left Shoulder Internal Rotation  90 Degrees    Left Shoulder External Rotation  35 Degrees      PROM   Overall PROM Comments  Assessed supine, er/IR adducted    PROM Assessment Site  Shoulder    Right/Left Shoulder  Left    Left Shoulder Flexion  145 Degrees    Left Shoulder  ABduction  146 Degrees    Left Shoulder Internal Rotation  90  Degrees    Left Shoulder External Rotation  38 Degrees      Strength   Overall Strength Comments  Assessed seated, er/IR adducted    Strength Assessment Site  Shoulder    Right/Left Shoulder  Left    Left Shoulder Flexion  4/5    Left Shoulder ABduction  4/5    Left Shoulder Internal Rotation  5/5    Left Shoulder External Rotation  4/5                      OT Education - 03/06/19 1603    Education Details  HEP for shoulder stretches, AA/ROM, and A/ROM with dates to begin each section    Person(s) Educated  Patient    Methods  Explanation;Demonstration;Handout    Comprehension  Verbalized understanding;Returned demonstration       OT Short Term Goals - 03/06/19 1625      OT SHORT TERM GOAL #1   Title  Pt will be provided with and educated on HEP to decrease pain and improve mobility required for LUE participation in ADLs.    Time  1    Period  Days    Status  Achieved    Target Date  03/06/19               Plan - 03/06/19 1604    Clinical Impression Statement  A: Pt is a 58 y/o female presenting with left shoulder impingment causing pain and tenderness, decreased ROM and strength limiting functional use of LUE during ADLs. Pt is having hip sx in 2 weeks and is interested in HEP versus in-clinic visits. Provided and educated pt on graduated HEP for shoulder stretches, AA/ROM, and A/ROM, also educated on use of tennis ball for self-myofascial release.    OT Occupational Profile and History  Problem Focused Assessment - Including review of records relating to presenting problem    Occupational performance deficits (Please refer to evaluation for details):  ADL's;IADL's;Leisure    Body Structure / Function / Physical Skills  ADL;UE functional use;Pain;ROM;Strength;IADL    Rehab Potential  Good    Clinical Decision Making  Limited treatment options, no task modification necessary    Comorbidities  Affecting Occupational Performance:  None    Modification or Assistance to Complete Evaluation   No modification of tasks or assist necessary to complete eval    OT Frequency  One time visit    OT Treatment/Interventions  Patient/family education    Plan  P: Pt agreeable to HEP and demonstrates good form with shoulder stretches, verbalized understanding for AA/ROM and A/ROM       Patient will benefit from skilled therapeutic intervention in order to improve the following deficits and impairments:   Body Structure / Function / Physical Skills: ADL, UE functional use, Pain, ROM, Strength, IADL       Visit Diagnosis: Stiffness of left shoulder, not elsewhere classified  Acute pain of left shoulder  Other symptoms and signs involving the musculoskeletal system    Problem List Patient Active Problem List   Diagnosis Date Noted  . Bloating 06/01/2018  . 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 Rectal bleeding 10/26/2017  . RUQ pain 10/21/2017  . LLQ pain 10/21/2017  . Nausea without vomiting 10/21/2017  . Paresthesias 05/14/2017  . Hyperlipidemia associated with type 2 diabetes mellitus ([place]) 03/22/2017  . Genetic testing 07/01/2016  . Lipoma of back 06/03/2016  . Reactive hypoglycemia 06/03/2016  . Family history of breast cancer in female  05/22/2016  . Diabetes ([place])   . IBS (irritable bowel syndrome) 12/10/2015  . Depression 08/23/2014  . Anxiety 08/23/2014  . Heart murmur 08/23/2014  . Hypercholesteremia 08/23/2014  . History of Chiari malformation 08/23/2014  . 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 Fever   . Vaginal dryness 07/16/2014  . 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 Joint pain 10/02/2013  . Neuropathic pain 10/02/2013  . Onychomycosis of toenail 10/02/2013  . Frequent headaches 06/19/2013  . Constipation 06/19/2013  . Anxiety as acute reaction to exceptional stress 05/22/2013  . Malignant neoplasm of upper-outer quadrant of left breast in female, estrogen receptor  positive ([place]) 04/06/2013  . 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 Obesity (BMI 30-39.9) 12/14/2012  . GERD (gastroesophageal reflux disease) 12/14/2012  . Chronic depression 12/14/2012  . Heart murmur, systolic [id number]  . Essential hypertension 09/02/2012  . Chest pain   . Palpitations   . DEGENERATIVE JOINT DISEASE, RIGHT KNEE 10/03/2009  . [place] DEGENERATION 10/24/2007   Guadelupe Sabin, OTR/L  [phone] 03/06/2019, 4:27 PM  [facility] [address] [place], [place], [address] Phone: [phone]   Fax:  [phone]  Name: [name] [name] MRN: [id number] Date of Birth: 20-Nov-1960

## 2019-03-07 ENCOUNTER — Telehealth (HOSPITAL_COMMUNITY): Payer: Self-pay | Admitting: Occupational Therapy

## 2019-03-07 ENCOUNTER — Encounter: Payer: Medicare HMO | Admitting: Physical Therapy

## 2019-03-07 NOTE — Telephone Encounter (Signed)
pt called to cancel all of her sept appts due to she is having surgery. Per the pt she will call back to reschedule.

## 2019-03-08 ENCOUNTER — Ambulatory Visit (HOSPITAL_COMMUNITY): Payer: Medicare HMO | Admitting: Occupational Therapy

## 2019-03-09 ENCOUNTER — Encounter: Payer: Medicare HMO | Admitting: Physical Therapy

## 2019-03-10 NOTE — Progress Notes (Signed)
[id number] - [place] [address] [place] [address] Phone: [phone] Fax: [phone]    Your procedure is scheduled on Friday, September 11th.  Report to [facility] Main Entrance "A" at 10:30 A.M., and check in at the Admitting office.  Call this number if you have problems the morning of surgery:  [phone]  Call [phone] if you have any questions prior to your surgery date Monday-Friday 8am-4pm   Remember:  Do not eat after midnight the night before your surgery  You may drink clear liquids until 9:30 the morning of your surgery.   Clear liquids allowed are: Water, Non-Citrus Juices (without pulp), Carbonated Beverages, Clear Tea, Black Coffee Only, and Gatorade   Please complete your PRE-SURGERY G2 that was provided to you by ... the morning of surgery.  Please, if able, drink it in one setting. DO NOT SIP.   Take these medicines the morning of surgery with A SIP OF WATER  acyclovir (ZOVIRAX) atenolol (TENORMIN)  escitalopram (LEXAPRO) pantoprazole (PROTONIX)  If needed - ALPRAZolam Duanne Moron), EPINEPHrine injection,  7 days prior to surgery STOP taking any Aspirin (unless otherwise instructed by your surgeon), Aleve, Naproxen, Ibuprofen, Motrin, Advil, Goody's, BC's, all herbal medications, fish oil, and all vitamins.   How to Manage Your Diabetes  WHAT DO I DO ABOUT MY DIABETES MEDICATION?  Marland Kitchen Do not take metFORMIN (GLUCOPHAGE XR)/oral diabetes medicines (pills) the morning of surgery.  Before and After Surgery  Why is it important to control my blood sugar before and after surgery? . Improving blood sugar levels before and after surgery helps healing and can limit problems. . A way of improving blood sugar control is eating a healthy diet by: o  Eating less sugar and carbohydrates o  Increasing activity/exercise o  Talking with your doctor about reaching your blood sugar goals . High blood sugars (greater  than 180 mg/dL) can raise your risk of infections and slow your recovery, so you will need to focus on controlling your diabetes during the weeks before surgery. . Make sure that the doctor who takes care of your diabetes knows about your planned surgery including the date and location.  How do I manage my blood sugar before surgery? . Check your blood sugar at least 4 times a day, starting 2 days before surgery, to make sure that the level is not too high or low. o Check your blood sugar the morning of your surgery when you wake up and every 2 hours until you get to the Short Stay unit. . If your blood sugar is less than 70 mg/dL, you will need to treat for low blood sugar: o Do not take insulin. o Treat a low blood sugar (less than 70 mg/dL) with  cup of clear juice (cranberry or apple), 4 glucose tablets, OR glucose gel. Recheck blood sugar in 15 minutes after treatment (to make sure it is greater than 70 mg/dL). If your blood sugar is not greater than 70 mg/dL on recheck, call [phone] o  for further instructions. . Report your blood sugar to the short stay nurse when you get to Short Stay.  . If you are admitted to the hospital after surgery: o Your blood sugar will be checked by the staff and you will probably be given insulin after surgery (instead of oral diabetes medicines) to make sure you have good blood sugar levels. o The goal for blood sugar control after surgery is 80-180 mg/dL.  Reviewed  and Endorsed by [facility] Patient Education Committee, August 2015  The Morning of Surgery  Do not wear jewelry, make-up or nail polish.  Do not wear lotions, powders, or perfumes/colognes, or deodorant  Do not shave 48 hours prior to surgery.  Men may shave face and neck.  Do not bring valuables to the hospital.  [facility] is not responsible for any belongings or valuables.  If you are a smoker, DO NOT Smoke 24 hours prior to surgery IF you wear a CPAP at night please bring your  mask, tubing, and machine the morning of surgery   Remember that you must have someone to transport you home after your surgery, and remain with you for 24 hours if you are discharged the same day.  Contacts, glasses, hearing aids, dentures or bridgework may not be worn into surgery.   Leave your suitcase in the car.  After surgery it may be brought to your room.  For patients admitted to the hospital, discharge time will be determined by your treatment team.  Patients discharged the day of surgery will not be allowed to drive home.   Special instructions:   [facility]- Preparing For Surgery  Before surgery, you can play an important role. Because skin is not sterile, your skin needs to be as free of germs as possible. You can reduce the number of germs on your skin by washing with CHG (chlorahexidine gluconate) Soap before surgery.  CHG is an antiseptic cleaner which kills germs and bonds with the skin to continue killing germs even after washing.    Oral Hygiene is also important to reduce your risk of infection.  Remember - BRUSH YOUR TEETH THE MORNING OF SURGERY WITH YOUR REGULAR TOOTHPASTE  Please do not use if you have an allergy to CHG or antibacterial soaps. If your skin becomes reddened/irritated stop using the CHG.  Do not shave (including legs and underarms) for at least 48 hours prior to first CHG shower. It is OK to shave your face.  Please follow these instructions carefully.   1. Shower the NIGHT BEFORE SURGERY and the MORNING OF SURGERY with CHG Soap.   2. If you chose to wash your hair, wash your hair first as usual with your normal shampoo.  3. After you shampoo, rinse your hair and body thoroughly to remove the shampoo.  4. Use CHG as you would any other liquid soap. You can apply CHG directly to the skin and wash gently with a scrungie or a clean washcloth.   5. Apply the CHG Soap to your body ONLY FROM THE NECK DOWN.  Do not use on open wounds or open sores.  Avoid contact with your eyes, ears, mouth and genitals (private parts). Wash Face and genitals (private parts)  with your normal soap.   6. Wash thoroughly, paying special attention to the area where your surgery will be performed.  7. Thoroughly rinse your body with warm water from the neck down.  8. DO NOT shower/wash with your normal soap after using and rinsing off the CHG Soap.  9. Pat yourself dry with a CLEAN TOWEL.  10. Wear CLEAN PAJAMAS to bed the night before surgery, wear comfortable clothes the morning of surgery  11. Place CLEAN SHEETS on your bed the night of your first shower and DO NOT SLEEP WITH PETS.  Day of Surgery: Do not apply any deodorants/lotions. Please shower the morning of surgery with the CHG soap  Please wear clean clothes to the  hospital/surgery center.   Remember to brush your teeth WITH YOUR REGULAR TOOTHPASTE.  Please read over the following fact sheets that you were given.

## 2019-03-14 ENCOUNTER — Other Ambulatory Visit (HOSPITAL_COMMUNITY)
Admission: RE | Admit: 2019-03-14 | Discharge: 2019-03-14 | Disposition: A | Discharge status: Home / Self Care | Payer: Medicare HMO | Source: Ambulatory Visit | Attending: Orthopedic Surgery | Admitting: Orthopedic Surgery

## 2019-03-14 ENCOUNTER — Encounter (HOSPITAL_COMMUNITY)
Admission: RE | Admit: 2019-03-14 | Discharge: 2019-03-14 | Disposition: A | Discharge status: Home / Self Care | Payer: Medicare HMO | Source: Ambulatory Visit | Attending: Orthopedic Surgery | Admitting: Orthopedic Surgery

## 2019-03-14 ENCOUNTER — Encounter (HOSPITAL_COMMUNITY): Payer: Self-pay

## 2019-03-14 ENCOUNTER — Other Ambulatory Visit: Payer: Self-pay

## 2019-03-14 ENCOUNTER — Encounter (HOSPITAL_COMMUNITY): Payer: Medicare HMO

## 2019-03-14 DIAGNOSIS — Z803 Family history of malignant neoplasm of breast: Secondary | ICD-10-CM | POA: Diagnosis not present

## 2019-03-14 DIAGNOSIS — Z853 Personal history of malignant neoplasm of breast: Secondary | ICD-10-CM | POA: Diagnosis not present

## 2019-03-14 DIAGNOSIS — M7062 Trochanteric bursitis, left hip: Secondary | ICD-10-CM | POA: Diagnosis not present

## 2019-03-14 DIAGNOSIS — Z20828 Contact with and (suspected) exposure to other viral communicable diseases: Secondary | ICD-10-CM | POA: Diagnosis not present

## 2019-03-14 DIAGNOSIS — Z87891 Personal history of nicotine dependence: Secondary | ICD-10-CM | POA: Diagnosis not present

## 2019-03-14 DIAGNOSIS — M1990 Unspecified osteoarthritis, unspecified site: Secondary | ICD-10-CM | POA: Diagnosis not present

## 2019-03-14 DIAGNOSIS — Z01818 Encounter for other preprocedural examination: Secondary | ICD-10-CM | POA: Insufficient documentation

## 2019-03-14 DIAGNOSIS — Z7984 Long term (current) use of oral hypoglycemic drugs: Secondary | ICD-10-CM | POA: Diagnosis not present

## 2019-03-14 DIAGNOSIS — R69 Illness, unspecified: Secondary | ICD-10-CM | POA: Diagnosis not present

## 2019-03-14 DIAGNOSIS — E119 Type 2 diabetes mellitus without complications: Secondary | ICD-10-CM | POA: Diagnosis not present

## 2019-03-14 DIAGNOSIS — Z79899 Other long term (current) drug therapy: Secondary | ICD-10-CM | POA: Diagnosis not present

## 2019-03-14 DIAGNOSIS — F419 Anxiety disorder, unspecified: Secondary | ICD-10-CM | POA: Diagnosis not present

## 2019-03-14 DIAGNOSIS — E118 Type 2 diabetes mellitus with unspecified complications: Secondary | ICD-10-CM | POA: Insufficient documentation

## 2019-03-14 DIAGNOSIS — E7800 Pure hypercholesterolemia, unspecified: Secondary | ICD-10-CM | POA: Diagnosis not present

## 2019-03-14 DIAGNOSIS — Z923 Personal history of irradiation: Secondary | ICD-10-CM | POA: Diagnosis not present

## 2019-03-14 DIAGNOSIS — F329 Major depressive disorder, single episode, unspecified: Secondary | ICD-10-CM | POA: Diagnosis not present

## 2019-03-14 DIAGNOSIS — K219 Gastro-esophageal reflux disease without esophagitis: Secondary | ICD-10-CM | POA: Diagnosis not present

## 2019-03-14 DIAGNOSIS — Z9221 Personal history of antineoplastic chemotherapy: Secondary | ICD-10-CM | POA: Diagnosis not present

## 2019-03-14 HISTORY — DX: Compression of brain: G93.5

## 2019-03-14 LAB — GLUCOSE, CAPILLARY: Glucose-Capillary: 107 mg/dL — ABNORMAL HIGH (ref 70–99)

## 2019-03-14 LAB — BASIC METABOLIC PANEL
Anion gap: 10 (ref 5–15)
BUN: 11 mg/dL (ref 6–20)
CO2: 24 mmol/L (ref 22–32)
Calcium: 9.3 mg/dL (ref 8.9–10.3)
Chloride: 107 mmol/L (ref 98–111)
Creatinine, Ser: 0.9 mg/dL (ref 0.44–1.00)
GFR calc Af Amer: >60 mL/min (ref >60)
GFR calc non Af Amer: >60 mL/min (ref >60)
Glucose, Bld: 86 mg/dL (ref 70–99)
Potassium: 3.8 mmol/L (ref 3.5–5.1)
Sodium: 141 mmol/L (ref 135–145)

## 2019-03-14 LAB — CBC
HCT: 39.8 % (ref 36.0–46.0)
Hemoglobin: 12.9 g/dL (ref 12.0–15.0)
MCH: 26.7 pg (ref 26.0–34.0)
MCHC: 32.4 g/dL (ref 30.0–36.0)
MCV: 82.4 fL (ref 80.0–100.0)
Platelets: 208 10*3/uL (ref 150–400)
RBC: 4.83 MIL/uL (ref 3.87–5.11)
RDW: 14.9 % (ref 11.5–15.5)
WBC: 9.1 10*3/uL (ref 4.0–10.5)
nRBC: 0 % (ref 0.0–0.2)

## 2019-03-14 NOTE — Progress Notes (Signed)
PCP -  Dr. Baltazar Apo Cardiologist - N/A  Chest x-ray - N/A EKG - 03/14/2019 Stress Test - N/A ECHO - 04/14/2017 Cardiac Cath - N/A  Sleep Study - N/A CPAP - N/A  Fasting Blood Sugar - 100-110 Checks Blood Sugar ___1__ times a day  Blood Thinner Instructions: N/A Aspirin Instructions: N/A  Anesthesia review: Yes; cardiac hx, palpitations, murmur, HTN  Patient denies shortness of breath, fever, cough and chest pain at PAT appointment   Coronavirus Screening  Have you experienced the following symptoms:  Cough yes/no: No Fever (>100.42F)  yes/no: No Runny nose yes/no: No Sore throat yes/no: No Difficulty breathing/shortness of breath  yes/no: No  Have you or a family member traveled in the last 14 days and where? yes/no: No   If the patient indicates "YES" to the above questions, their PAT will be rescheduled to limit the exposure to others and, the surgeon will be notified. THE PATIENT WILL NEED TO BE ASYMPTOMATIC FOR 14 DAYS.   If the patient is not experiencing any of these symptoms, the PAT nurse will instruct them to NOT bring anyone with them to their appointment since they may have these symptoms or traveled as well.   Please remind your patients and families that hospital visitation restrictions are in effect and the importance of the restrictions.  Patient verbalized understanding of instructions that were given to them at the PAT appointment. Patient was also instructed that they will need to review over the PAT instructions again at home before surgery.

## 2019-03-15 ENCOUNTER — Encounter (HOSPITAL_COMMUNITY): Payer: Self-pay

## 2019-03-15 LAB — HEMOGLOBIN A1C
Hgb A1c MFr Bld: 5.9 % — ABNORMAL HIGH (ref 4.8–5.6)
Mean Plasma Glucose: 123 mg/dL

## 2019-03-15 NOTE — Anesthesia Preprocedure Evaluation (Addendum)
Anesthesia Evaluation  Patient identified by MRN, date of birth, ID band  Reviewed: Allergy & Precautions, NPO status , Patient's Chart, lab work & pertinent test results, reviewed documented beta blocker date and time   Airway Mallampati: I  TM Distance: >3 FB Neck ROM: Full    Dental no notable dental hx.    Pulmonary former smoker,    Pulmonary exam normal breath sounds clear to auscultation       Cardiovascular hypertension, Normal cardiovascular exam Rhythm:Regular Rate:Normal     Neuro/Psych  Headaches, PSYCHIATRIC DISORDERS Anxiety Depression    GI/Hepatic Neg liver ROS, GERD  Medicated,  Endo/Other  diabetes, Type 2, Oral Hypoglycemic Agents  Renal/GU negative Renal ROS  negative genitourinary   Musculoskeletal  (+) Arthritis , Osteoarthritis,  DDD lumbosacral   Abdominal Normal abdominal exam  (+)   Peds  Hematology  (+) anemia ,   Anesthesia Other Findings   Reproductive/Obstetrics negative OB ROS                           Anesthesia Physical Anesthesia Plan  ASA: II  Anesthesia Plan: General   Post-op Pain Management:    Induction: Intravenous  PONV Risk Score and Plan: 2 and Ondansetron and Dexamethasone  Airway Management Planned: Oral ETT  Additional Equipment: None  Intra-op Plan:   Post-operative Plan:   Informed Consent: I have reviewed the patients History and Physical, chart, labs and discussed the procedure including the risks, benefits and alternatives for the proposed anesthesia with the patient or authorized representative who has indicated his/her understanding and acceptance.     Dental advisory given  Plan Discussed with: CRNA  Anesthesia Plan Comments: ( )    Anesthesia Quick Evaluation

## 2019-03-15 NOTE — Progress Notes (Signed)
Anesthesia Chart Review:  Case: [id number] Date/Time: 03/17/19 1215   Procedure: Left hip open bursectomy (Left Hip) - 75 mins   Anesthesia type: Choice   Pre-op diagnosis: Left hip trochanteric bursitis   Location: [facility] OR ROOM 04 / [facility]   Surgeon: Nicholes Stairs, MD      DISCUSSION: Patient is a 58 year old female scheduled for the above procedure.   History includes former smoker (quit 1984), palpitations, murmur (trivial TR 2018), DM2, left breast cancer (diagnosed 2014; Port-a-cath 04/21/13 for chemotherapy, s/p left breast lumpectomy, axillary node dissection 09/12/13, s/p radiation), chiari malformation type 1 (s/p suboccipital craniectomy, C1 laminectomy, superior C2 laminectomy, duraplasty 05/10/14), meningitis (2016), GERD, colitis (2010), hypercholesterolemia, chest pain (2009. History of chest pain in 2009 (low risk stress test 2018). Denied history of HTN.  03/14/19 COVID-19 test in process.  If negative and otherwise no acute changes and I would anticipate that she could proceed as planned.   VS: BP (!) 111/56   Pulse 83   Temp (!) 36.3 C   Resp 20   Ht '5\' 1"'  (1.549 m)   Wt 73.6 kg   LMP 12/04/2008   SpO2 100%   BMI 30.67 kg/m   PROVIDERS: Mikey Kirschner, MD is PCP  Magrinat, Sarajane Jews, MD is HEM-ONC Thea Silversmith, MD is RAD-ONC GI is with [facility] She is not followed routinely by a cardiologist, but has been seen periodically since at least 2013 for palpitations (treated with b-blocker) and at the Cardio-Oncology clinic Aundra Dubin, Kirk Ruths, MD and Glori Bickers, MD) while undergoing chemotherapy, and most recently in 2018 by Kate Sable MD for cardiology evaluation for dyspnea with stress and echo ordered (see below).    LABS: Labs reviewed: Acceptable for surgery. (all labs ordered are listed, but only abnormal results are displayed)  Labs Reviewed  HEMOGLOBIN A1C - Abnormal; Notable for the following components:   Result Value   Hgb A1c MFr Bld 5.9 (*)    All other components within normal limits  GLUCOSE, CAPILLARY - Abnormal; Notable for the following components:   Glucose-Capillary 107 (*)    All other components within normal limits  BASIC METABOLIC PANEL  CBC    Spirometry 05/12/17:  Ref. Range 05/12/2017 12:34  FVC-Pre Latest Units: L 2.14  FVC-%Pred-Pre Latest Units: % 88  FEV1-Pre Latest Units: L 1.86  FEV1-%Pred-Pre Latest Units: % 96  Pre FEV1/FVC ratio Latest Units: % 87  FEV1FVC-%Pred-Pre Latest Units: % 108  FEF 25-75 Pre Latest Units: L/sec 2.57  FEF2575-%Pred-Pre Latest Units: % 128  FEV6-Pre Latest Units: L 2.14  FEV6-%Pred-Pre Latest Units: % 91  Pre FEV6/FVC Ratio Latest Units: % 100  FEV6FVC-%Pred-Pre Latest Units: % 104    IMAGES: CT Chest 01/31/19: IMPRESSION: 1. Stable exam. No findings to suggest recurrent tumor or metastatic disease.  NM Bone scan 01/31/19: IMPRESSION: - Single focus of questionable abnormal increased tracer accumulation at the LEFT greater trochanter, nonspecific, can be seen with trauma but metastatic disease is not excluded. - Consider radiographic assessment as first step in workup.   EKG: 03/14/19: NSR   CV: Nuclear stress tet 04/14/17:  Study Highlights No diagnostic ST segment changes to indicate ischemia.  Small, mild intensity, apical defect that is fixed and consistent with soft tissue attenuation.  This is a low risk study.  Nuclear stress EF: 71%.   Echo 04/14/17: Study Conclusions - Left ventricle: The cavity size was normal. Wall thickness was   increased in a pattern of  mild LVH. Systolic function was normal.   The estimated ejection fraction was in the range of 55% to 60%.   Wall motion was normal; there were no regional wall motion   abnormalities. Left ventricular diastolic function parameters   were normal. - Right atrium: Central venous pressure (est): 3 mm Hg. - Atrial septum: No defect or patent foramen  ovale was identified. - Tricuspid valve: There was trivial regurgitation. - Pulmonary arteries: Systolic pressure could not be accurately   estimated. - Pericardium, extracardiac: There was no pericardial effusion. Impressions: - Mild LVH with LVEF 55-60% and normal diastolic function. Trivial   tricuspid regurgitation.   Carotid US 11/20/15: IMPRESSION: Negative bilateral carotid duplex ultrasound. No significant atherosclerotic plaque or evidence of stenosis.   Cardiac event monitor 06/27/12-07/17/12: Summary: NSR, PACs. No significant arrhythmias detected.   Past Medical History:  Diagnosis Date  . Allergic rhinitis   . Anemia, unspecified 05/30/2013  . Anxiety   . Breast cancer ([place]) 03/31/13   left  . Cancer ([place])   . Chest pain 2009   Consultation-Rothbart, negative chest CT; nl echo in 2005; h/o palpitations  . Chiari malformation type I ([place])    s/p suboccipital craniectomy, C1 laminectomy, superior C2 laminectomy, duraplasty 05/10/14  . Colitis 2010   not IBD  . Degenerative joint disease    + degenerative joint disease of the lumbosacral spine  . Depression   . Diabetes ([place])   . Diabetes ([place])   . Dysrhythmia    palpations  . GERD (gastroesophageal reflux disease)    "a little"  . Headache   . Heart murmur    "small"  . Herpes simplex type II infection   . Hypercholesteremia    "slightly high"  . Hypertension    does not have high blood pressure  . 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 Palpitations   . Personal history of chemotherapy   . Personal history of radiation therapy   . Wears glasses     Past Surgical History:  Procedure Laterality Date  . ABDOMINAL HYSTERECTOMY  03/13/2007   TAH ?BSO--Dr Levin Bacon, [place]  . BREAST EXCISIONAL BIOPSY  04/2003   Left; benign disease  . BREAST LUMPECTOMY Left 09/2013  . BREAST LUMPECTOMY WITH NEEDLE LOCALIZATION AND AXILLARY LYMPH NODE DISSECTION Left 09/12/2013   Procedure: BREAST LUMPECTOMY WITH NEEDLE  LOCALIZATION AND AXILLARY LYMPH NODE DISSECTION;  Surgeon: [name], MD;  Location: [place];  Service: General;  Laterality: Left;  and axilla  . BREAST SURGERY    . COLONOSCOPY  10/2010   proctitis; melanosis coli  . COLONOSCOPY WITH PROPOFOL N/A 01/04/2018   Dr. [name]: hemorrhoids, follow up colonoscopy in 10 years  . EYE SURGERY Left    "fix lazy eye"  . PORTACATH PLACEMENT Right 04/21/2013   Procedure: INSERTION PORT-A-CATH;  Surgeon: [name], MD;  Location: [facility];  Service: General;  Laterality: Right;  . RIGHT OOPHORECTOMY  03/13/2007  . SUBOCCIPITAL CRANIECTOMY CERVICAL LAMINECTOMY N/A 05/10/2014   Procedure: SUBOCCIPITAL CRANIECTOMY CERVICAL LAMINECTOMY/DURAPLASTY;  Surgeon: [name], MD;  Location: [place] [facility];  Service: Neurosurgery;  Laterality: N/A;  suboccipital craniectomy with cervical laminectomy and duraplasty  . TUBAL LIGATION      MEDICATIONS: . acyclovir (ZOVIRAX) 400 MG tablet  . ALPRAZolam (XANAX) 1 MG tablet  . atenolol (TENORMIN) 50 MG tablet  . atorvastatin (LIPITOR) 20 MG tablet  . blood glucose meter kit and supplies KIT  . blood glucose meter kit and supplies  .  cholecalciferol (VITAMIN D3) 25 MCG (1000 UT) tablet  . clotrimazole-betamethasone (LOTRISONE) cream  . EPINEPHrine 0.3 mg/0.3 mL IJ SOAJ injection  . escitalopram (LEXAPRO) 20 MG tablet  . Lancets (ONETOUCH DELICA PLUS [id number]) MISC  . levocetirizine (XYZAL) 5 MG tablet  . Melatonin 3 MG CAPS  . metFORMIN (GLUCOPHAGE XR) 500 MG 24 hr tablet  . ONE TOUCH ULTRA TEST test strip  . pantoprazole (PROTONIX) 40 MG tablet  . polyethylene glycol powder (MIRALAX) powder  . valACYclovir (VALTREX) 1000 MG tablet   No current facility-administered medications for this encounter.     Myra Gianotti, PA-C Surgical Short Stay/Anesthesiology [facility] Phone [phone] [facility] Phone [phone] 03/15/2019 12:53 PM

## 2019-03-16 ENCOUNTER — Encounter (HOSPITAL_COMMUNITY): Payer: Medicare HMO | Admitting: Occupational Therapy

## 2019-03-16 LAB — NOVEL CORONAVIRUS, NAA (HOSP ORDER, SEND-OUT TO REF LAB; TAT 18-24 HRS): SARS-CoV-2, NAA: NOT DETECTED

## 2019-03-17 ENCOUNTER — Ambulatory Visit (HOSPITAL_COMMUNITY): Payer: Medicare HMO | Admitting: Certified Registered"

## 2019-03-17 ENCOUNTER — Other Ambulatory Visit: Payer: Self-pay

## 2019-03-17 ENCOUNTER — Encounter (HOSPITAL_COMMUNITY): Payer: Self-pay | Admitting: Surgery

## 2019-03-17 ENCOUNTER — Encounter (HOSPITAL_COMMUNITY)
Admission: RE | Disposition: A | Discharge status: Home / Self Care | Payer: Self-pay | Source: Home / Self Care | Attending: Orthopedic Surgery

## 2019-03-17 ENCOUNTER — Ambulatory Visit (HOSPITAL_COMMUNITY)
Admission: RE | Admit: 2019-03-17 | Discharge: 2019-03-17 | Disposition: A | Discharge status: Home / Self Care | Payer: Medicare HMO | Attending: Orthopedic Surgery | Admitting: Orthopedic Surgery

## 2019-03-17 ENCOUNTER — Ambulatory Visit (HOSPITAL_COMMUNITY): Payer: Medicare HMO | Admitting: Vascular Surgery

## 2019-03-17 DIAGNOSIS — M7062 Trochanteric bursitis, left hip: Secondary | ICD-10-CM | POA: Diagnosis not present

## 2019-03-17 DIAGNOSIS — Z9221 Personal history of antineoplastic chemotherapy: Secondary | ICD-10-CM | POA: Diagnosis not present

## 2019-03-17 DIAGNOSIS — E785 Hyperlipidemia, unspecified: Secondary | ICD-10-CM | POA: Diagnosis not present

## 2019-03-17 DIAGNOSIS — K219 Gastro-esophageal reflux disease without esophagitis: Secondary | ICD-10-CM | POA: Insufficient documentation

## 2019-03-17 DIAGNOSIS — E7800 Pure hypercholesterolemia, unspecified: Secondary | ICD-10-CM | POA: Insufficient documentation

## 2019-03-17 DIAGNOSIS — Z79899 Other long term (current) drug therapy: Secondary | ICD-10-CM | POA: Insufficient documentation

## 2019-03-17 DIAGNOSIS — I10 Essential (primary) hypertension: Secondary | ICD-10-CM | POA: Diagnosis not present

## 2019-03-17 DIAGNOSIS — Z20828 Contact with and (suspected) exposure to other viral communicable diseases: Secondary | ICD-10-CM | POA: Diagnosis not present

## 2019-03-17 DIAGNOSIS — E119 Type 2 diabetes mellitus without complications: Secondary | ICD-10-CM | POA: Insufficient documentation

## 2019-03-17 DIAGNOSIS — M1990 Unspecified osteoarthritis, unspecified site: Secondary | ICD-10-CM | POA: Diagnosis not present

## 2019-03-17 DIAGNOSIS — F329 Major depressive disorder, single episode, unspecified: Secondary | ICD-10-CM | POA: Insufficient documentation

## 2019-03-17 DIAGNOSIS — Z87891 Personal history of nicotine dependence: Secondary | ICD-10-CM | POA: Insufficient documentation

## 2019-03-17 DIAGNOSIS — F419 Anxiety disorder, unspecified: Secondary | ICD-10-CM | POA: Diagnosis not present

## 2019-03-17 DIAGNOSIS — Z923 Personal history of irradiation: Secondary | ICD-10-CM | POA: Insufficient documentation

## 2019-03-17 DIAGNOSIS — Z853 Personal history of malignant neoplasm of breast: Secondary | ICD-10-CM | POA: Insufficient documentation

## 2019-03-17 DIAGNOSIS — Z7984 Long term (current) use of oral hypoglycemic drugs: Secondary | ICD-10-CM | POA: Diagnosis not present

## 2019-03-17 DIAGNOSIS — Z803 Family history of malignant neoplasm of breast: Secondary | ICD-10-CM | POA: Insufficient documentation

## 2019-03-17 DIAGNOSIS — R69 Illness, unspecified: Secondary | ICD-10-CM | POA: Diagnosis not present

## 2019-03-17 HISTORY — PX: EXCISION/RELEASE BURSA HIP: SHX5014

## 2019-03-17 LAB — GLUCOSE, CAPILLARY
Glucose-Capillary: 87 mg/dL (ref 70–99)
Glucose-Capillary: 84 mg/dL (ref 70–99)

## 2019-03-17 SURGERY — RELEASE, BURSA, TROCHANTERIC
Anesthesia: General | Site: Hip | Laterality: Left

## 2019-03-17 MED ORDER — FENTANYL CITRATE (PF) 100 MCG/2ML IJ SOLN
INTRAMUSCULAR | Status: AC
  Filled 2019-03-17: qty 2

## 2019-03-17 MED ORDER — HYDROMORPHONE HCL 1 MG/ML IJ SOLN
0.2500 mg | INTRAMUSCULAR | Status: DC | PRN
  Administered 2019-03-17: 0.5 mg via INTRAVENOUS

## 2019-03-17 MED ORDER — MIDAZOLAM HCL 5 MG/5ML IJ SOLN
INTRAMUSCULAR | Status: DC | PRN
  Administered 2019-03-17: 2 mg via INTRAVENOUS

## 2019-03-17 MED ORDER — HYDROCODONE-ACETAMINOPHEN 5-325 MG PO TABS: 1.0000 | ORAL_TABLET | Freq: Four times a day (QID) | ORAL | 0 refills | Status: DC | PRN

## 2019-03-17 MED ORDER — OXYCODONE HCL 5 MG PO TABS
ORAL_TABLET | ORAL | Status: AC
  Filled 2019-03-17: qty 1

## 2019-03-17 MED ORDER — PROPOFOL 10 MG/ML IV BOLUS
INTRAVENOUS | Status: DC | PRN
  Administered 2019-03-17: 150 mg via INTRAVENOUS

## 2019-03-17 MED ORDER — DEXAMETHASONE SODIUM PHOSPHATE 10 MG/ML IJ SOLN
INTRAMUSCULAR | Status: AC
  Filled 2019-03-17: qty 3

## 2019-03-17 MED ORDER — EPHEDRINE 5 MG/ML INJ
INTRAVENOUS | Status: AC
  Filled 2019-03-17: qty 30

## 2019-03-17 MED ORDER — CHLORHEXIDINE GLUCONATE 4 % EX LIQD: 60.0000 mL | Freq: Once | CUTANEOUS | Status: DC

## 2019-03-17 MED ORDER — OXYCODONE HCL 5 MG PO TABS
5.0000 mg | ORAL_TABLET | Freq: Once | ORAL | Status: AC | PRN
  Administered 2019-03-17: 5 mg via ORAL

## 2019-03-17 MED ORDER — FENTANYL CITRATE (PF) 250 MCG/5ML IJ SOLN
INTRAMUSCULAR | Status: DC | PRN
  Administered 2019-03-17: 100 ug via INTRAVENOUS

## 2019-03-17 MED ORDER — GLYCOPYRROLATE PF 0.2 MG/ML IJ SOSY
PREFILLED_SYRINGE | INTRAMUSCULAR | Status: AC
  Filled 2019-03-17: qty 3

## 2019-03-17 MED ORDER — EPHEDRINE SULFATE 50 MG/ML IJ SOLN
INTRAMUSCULAR | Status: DC | PRN
  Administered 2019-03-17: 10 mg via INTRAVENOUS

## 2019-03-17 MED ORDER — ROCURONIUM BROMIDE 10 MG/ML (PF) SYRINGE
PREFILLED_SYRINGE | INTRAVENOUS | Status: AC
  Filled 2019-03-17: qty 10

## 2019-03-17 MED ORDER — LIDOCAINE 2% (20 MG/ML) 5 ML SYRINGE
INTRAMUSCULAR | Status: DC | PRN
  Administered 2019-03-17: 60 mg via INTRAVENOUS

## 2019-03-17 MED ORDER — SUCCINYLCHOLINE CHLORIDE 20 MG/ML IJ SOLN
INTRAMUSCULAR | Status: DC | PRN
  Administered 2019-03-17: 120 mg via INTRAVENOUS

## 2019-03-17 MED ORDER — LIDOCAINE 2% (20 MG/ML) 5 ML SYRINGE
INTRAMUSCULAR | Status: AC
  Filled 2019-03-17: qty 25

## 2019-03-17 MED ORDER — KETOROLAC TROMETHAMINE 30 MG/ML IJ SOLN: 30.0000 mg | Freq: Once | INTRAMUSCULAR | Status: DC | PRN

## 2019-03-17 MED ORDER — SUCCINYLCHOLINE CHLORIDE 200 MG/10ML IV SOSY
PREFILLED_SYRINGE | INTRAVENOUS | Status: AC
  Filled 2019-03-17: qty 40

## 2019-03-17 MED ORDER — CEFAZOLIN SODIUM-DEXTROSE 2-4 GM/100ML-% IV SOLN
2.0000 g | INTRAVENOUS | Status: AC
  Administered 2019-03-17: 2 g via INTRAVENOUS
  Filled 2019-03-17: qty 100

## 2019-03-17 MED ORDER — ONDANSETRON 4 MG PO TBDP: 4.0000 mg | ORAL_TABLET | Freq: Three times a day (TID) | ORAL | 0 refills | Status: DC | PRN

## 2019-03-17 MED ORDER — OXYCODONE HCL 5 MG/5ML PO SOLN: 5.0000 mg | Freq: Once | ORAL | Status: AC | PRN

## 2019-03-17 MED ORDER — ONDANSETRON HCL 4 MG/2ML IJ SOLN
INTRAMUSCULAR | Status: DC | PRN
  Administered 2019-03-17: 4 mg via INTRAVENOUS

## 2019-03-17 MED ORDER — PHENYLEPHRINE 40 MCG/ML (10ML) SYRINGE FOR IV PUSH (FOR BLOOD PRESSURE SUPPORT)
PREFILLED_SYRINGE | INTRAVENOUS | Status: AC
  Filled 2019-03-17: qty 30

## 2019-03-17 MED ORDER — PROMETHAZINE HCL 25 MG/ML IJ SOLN: 6.2500 mg | INTRAMUSCULAR | Status: DC | PRN

## 2019-03-17 MED ORDER — ACETAMINOPHEN 500 MG PO TABS
1000.0000 mg | ORAL_TABLET | Freq: Once | ORAL | Status: AC
  Administered 2019-03-17: 1000 mg via ORAL
  Filled 2019-03-17: qty 2

## 2019-03-17 MED ORDER — ONDANSETRON HCL 4 MG/2ML IJ SOLN
INTRAMUSCULAR | Status: AC
  Filled 2019-03-17: qty 8

## 2019-03-17 MED ORDER — HYDROMORPHONE HCL 1 MG/ML IJ SOLN
INTRAMUSCULAR | Status: AC
  Filled 2019-03-17: qty 1

## 2019-03-17 MED ORDER — PROPOFOL 500 MG/50ML IV EMUL
INTRAVENOUS | Status: DC | PRN
  Administered 2019-03-17: 20 ug/kg/min via INTRAVENOUS

## 2019-03-17 MED ORDER — MIDAZOLAM HCL 2 MG/2ML IJ SOLN
INTRAMUSCULAR | Status: AC
  Filled 2019-03-17: qty 2

## 2019-03-17 MED ORDER — LACTATED RINGERS IV SOLN
INTRAVENOUS | Status: DC
  Administered 2019-03-17: 11:00:00 via INTRAVENOUS

## 2019-03-17 MED ORDER — SODIUM CHLORIDE 0.9 % IV SOLN
INTRAVENOUS | Status: DC | PRN
  Administered 2019-03-17: 30 ug/min via INTRAVENOUS

## 2019-03-17 MED ORDER — DEXAMETHASONE SODIUM PHOSPHATE 10 MG/ML IJ SOLN
INTRAMUSCULAR | Status: DC | PRN
  Administered 2019-03-17: 5 mg via INTRAVENOUS

## 2019-03-17 MED ORDER — PROPOFOL 10 MG/ML IV BOLUS
INTRAVENOUS | Status: AC
  Filled 2019-03-17: qty 20

## 2019-03-17 MED ORDER — PROPOFOL 1000 MG/100ML IV EMUL
INTRAVENOUS | Status: AC
  Filled 2019-03-17: qty 100

## 2019-03-17 SURGICAL SUPPLY — 43 items
BAG DECANTER FOR FLEXI CONT (MISCELLANEOUS) IMPLANT
COVER SURGICAL LIGHT HANDLE (MISCELLANEOUS) ×2 IMPLANT
COVER WAND RF STERILE (DRAPES) ×2 IMPLANT
DRAPE IMP U-DRAPE 54X76 (DRAPES) ×2 IMPLANT
DRAPE ORTHO SPLIT 77X108 STRL (DRAPES) ×4
DRAPE SURG ORHT 6 SPLT 77X108 (DRAPES) ×2 IMPLANT
DRAPE U-SHAPE 47X51 STRL (DRAPES) ×2 IMPLANT
DRSG ADAPTIC 3X8 NADH LF (GAUZE/BANDAGES/DRESSINGS) ×2 IMPLANT
DRSG AQUACEL AG ADV 3.5X 6 (GAUZE/BANDAGES/DRESSINGS) ×2 IMPLANT
DRSG PAD ABDOMINAL 8X10 ST (GAUZE/BANDAGES/DRESSINGS) ×2 IMPLANT
DURAPREP 26ML APPLICATOR (WOUND CARE) ×2 IMPLANT
ELECT CAUTERY BLADE 6.4 (BLADE) IMPLANT
ELECT REM PT RETURN 9FT ADLT (ELECTROSURGICAL)
ELECTRODE REM PT RTRN 9FT ADLT (ELECTROSURGICAL) IMPLANT
GAUZE SPONGE 4X4 12PLY STRL (GAUZE/BANDAGES/DRESSINGS) ×2 IMPLANT
GLOVE BIO SURGEON STRL SZ8 (GLOVE) ×2 IMPLANT
GLOVE BIOGEL PI IND STRL 8 (GLOVE) ×1 IMPLANT
GLOVE BIOGEL PI INDICATOR 8 (GLOVE) ×1
GLOVE ORTHO TXT STRL SZ7.5 (GLOVE) ×2 IMPLANT
GOWN STRL REUS W/ TWL LRG LVL3 (GOWN DISPOSABLE) ×1 IMPLANT
GOWN STRL REUS W/ TWL XL LVL3 (GOWN DISPOSABLE) ×4 IMPLANT
GOWN STRL REUS W/TWL LRG LVL3 (GOWN DISPOSABLE) ×2
GOWN STRL REUS W/TWL XL LVL3 (GOWN DISPOSABLE) ×8
HANDPIECE INTERPULSE COAX TIP (DISPOSABLE)
KIT BASIN OR (CUSTOM PROCEDURE TRAY) ×2 IMPLANT
KIT TURNOVER KIT B (KITS) ×2 IMPLANT
MANIFOLD NEPTUNE II (INSTRUMENTS) ×2 IMPLANT
NS IRRIG 1000ML POUR BTL (IV SOLUTION) ×2 IMPLANT
PACK TOTAL JOINT (CUSTOM PROCEDURE TRAY) ×2 IMPLANT
PACK UNIVERSAL I (CUSTOM PROCEDURE TRAY) ×2 IMPLANT
PAD ARMBOARD 7.5X6 YLW CONV (MISCELLANEOUS) ×4 IMPLANT
SET HNDPC FAN SPRY TIP SCT (DISPOSABLE) IMPLANT
SPONGE LAP 18X18 RF (DISPOSABLE) ×2 IMPLANT
STAPLER VISISTAT 35W (STAPLE) ×2 IMPLANT
SUT ETHILON 2 0 FS 18 (SUTURE) ×2 IMPLANT
SUT VIC AB 1 CTB1 27 (SUTURE) ×4 IMPLANT
SUT VIC AB 2-0 CT1 27 (SUTURE) ×4
SUT VIC AB 2-0 CT1 TAPERPNT 27 (SUTURE) ×2 IMPLANT
SWAB CULTURE ESWAB REG 1ML (MISCELLANEOUS) IMPLANT
TOWEL GREEN STERILE (TOWEL DISPOSABLE) ×2 IMPLANT
TOWEL GREEN STERILE FF (TOWEL DISPOSABLE) ×2 IMPLANT
UNDERPAD 30X30 (UNDERPADS AND DIAPERS) ×2 IMPLANT
WATER STERILE IRR 1000ML POUR (IV SOLUTION) ×2 IMPLANT

## 2019-03-17 NOTE — Brief Op Note (Signed)
03/17/2019  1:52 PM  PATIENT:  [name] [name]  58 y.o. female  PRE-OPERATIVE DIAGNOSIS:  Left hip trochanteric bursitis  POST-OPERATIVE DIAGNOSIS:  Left hip trochanteric bursitis  PROCEDURE:  Procedure(s) with comments: Left hip open bursectomy (Left) - 75 mins  SURGEON:  Surgeon(s) and Role:    * Stann Mainland, Elly Modena, MD - Primary  PHYSICIAN ASSISTANT:   ASSISTANTS: none   ANESTHESIA:   general  EBL:  10 cc  BLOOD ADMINISTERED:none  DRAINS: none   LOCAL MEDICATIONS USED:  NONE  SPECIMEN:  No Specimen  DISPOSITION OF SPECIMEN:  N/A  COUNTS:  YES  TOURNIQUET:  * No tourniquets in log *  DICTATION: .Note written in EPIC  PLAN OF CARE: Discharge to home after PACU  PATIENT DISPOSITION:  PACU - hemodynamically stable.   Delay start of Pharmacological VTE agent (>24hrs) due to surgical blood loss or risk of bleeding: not applicable

## 2019-03-17 NOTE — Op Note (Signed)
Date of Surgery: 03/17/2019  INDICATIONS: Ms. Porta is a 58 y.o.-year-old female with a left hip trochanteric bursitis that has been recalcitrant to many months of conservative management including multiple steroid injections.;  The patient did consent to the procedure after discussion of the risks and benefits.  PREOPERATIVE DIAGNOSIS:  1.  Recalcitrant left hip trochanteric bursitis  POSTOPERATIVE DIAGNOSIS: Same.  PROCEDURE:  1.  Left hip open greater trochanteric bursectomy  SURGEON: Geralynn Rile, M.D.  ASSIST: None.  ANESTHESIA:  general  IV FLUIDS AND URINE: See anesthesia.  ESTIMATED BLOOD LOSS: 15 mL.  IMPLANTS: None  DRAINS: None  COMPLICATIONS: None.  DESCRIPTION OF PROCEDURE: The patient was brought to the operating room and placed supine on the operating table.  The patient had been signed prior to the procedure and this was documented. The patient had the anesthesia placed by the anesthesiologist.  Following adequate general anesthesia the patient was placed in a lateral position with the left hip exposed.  This was a well-padded beanbag position.  A time-out was performed to confirm that this was the correct patient, site, side and location. The patient did receive antibiotics prior to the incision and was re-dosed during the procedure as needed at indicated intervals.  A tourniquet was not placed.  The patient had the operative extremity prepped and draped in the standard surgical fashion.      A direct lateral incision was developed over the tip of the greater trochanter.  We dissected down through the skin and subcutaneous fat to the level of the tensor fascia and iliotibial band.  There was noted to be some areas of injection and erythema along the point of contact with the greater trochanter.  The iliotibial band was incised in line with its fibers and extended proximally to the tensor fascia muscle.  Directly deep to that we encountered the trochanteric bursa.   This was not terribly inflamed but was quite redundant and was incised and then excised completely using rongeurs and electrocautery.  The hip was taken through gentle internal and external rotation and abduction to ensure complete bursectomy.  We then copes irrigated deep to the iliotibial band through our longitudinal incision.  Closure was then undertaken with #1 PDS interrupted sutures for the iliotibial band and tensor fascia.  Next the fat layer was closed with 0 Vicryl.  The dermal layer was closed with 2-0 Vicryl, and then a 3-0 running subcuticular Monocryl suture for the skin.  Skin was cleaned and dried and Steri-Strips were applied.  A sterile waterproof dressing was applied to the incision.  Patient was awakened from general anesthesia with no complications.  All counts were correct x2.  POSTOPERATIVE PLAN:  Ms. Colvert will be weightbearing as tolerated to the left lower extremity.  She will maintain her postoperative bandage for 2 weeks until her follow-up appointment where she will have a wound check.  She will apply ice to the incision and take Tylenol and Advil around-the-clock with narcotics as needed for pain.  I will see her in the office in 2 weeks.

## 2019-03-17 NOTE — H&P (Signed)
ORTHOPAEDIC H and P  REQUESTING PHYSICIAN: Nicholes Stairs, MD  PCP:  Mikey Kirschner, MD  Chief Complaint: Left hip pain  HPI: [name] [name] is a 58 y.o. female who complains of recalcitrant left lateral hip pain.  She has had multiple cortisone injections into the trochanteric bursa.  These have been quite helpful, but have had diminishing duration of effect.  She is here today for more definitive treatment in the form of open left hip bursectomy.  No new complaints today.  No new concerns or questions.  We have previously reviewed this diagnosis and recommendation for surgery at length in the office.  Past Medical History:  Diagnosis Date  . Allergic rhinitis   . Anemia, unspecified 05/30/2013  . Anxiety   . Breast cancer ([place]) 03/31/13   left  . Cancer ([place])   . Chest pain 2009   Consultation-Rothbart, negative chest CT; nl echo in 2005; h/o palpitations  . Chiari malformation type I ([place])    s/p suboccipital craniectomy, C1 laminectomy, superior C2 laminectomy, duraplasty 05/10/14  . Colitis 2010   not IBD  . Degenerative joint disease    + degenerative joint disease of the lumbosacral spine  . Depression   . Diabetes ([place])   . Diabetes ([place])   . Dysrhythmia    palpations  . GERD (gastroesophageal reflux disease)    "a little"  . Headache   . Heart murmur    "small"  . Herpes simplex type II infection   . Hypercholesteremia    "slightly high"  . Hypertension    does not have high blood pressure  . 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 Palpitations   . Personal history of chemotherapy   . Personal history of radiation therapy   . Wears glasses    Past Surgical History:  Procedure Laterality Date  . ABDOMINAL HYSTERECTOMY  03/13/2007   TAH ?BSO--Dr Levin Bacon, [place]  . BREAST EXCISIONAL BIOPSY  04/2003   Left; benign disease  . BREAST LUMPECTOMY Left 09/2013  . BREAST LUMPECTOMY WITH NEEDLE LOCALIZATION AND AXILLARY LYMPH NODE DISSECTION Left 09/12/2013   Procedure: BREAST LUMPECTOMY WITH NEEDLE LOCALIZATION AND AXILLARY LYMPH NODE DISSECTION;  Surgeon: [name], MD;  Location: [place];  Service: General;  Laterality: Left;  and axilla  . BREAST SURGERY    . COLONOSCOPY  10/2010   proctitis; melanosis coli  . COLONOSCOPY WITH PROPOFOL N/A 01/04/2018   Dr. [name]: hemorrhoids, follow up colonoscopy in 10 years  . EYE SURGERY Left    "fix lazy eye"  . PORTACATH PLACEMENT Right 04/21/2013   Procedure: INSERTION PORT-A-CATH;  Surgeon: [name], MD;  [facility];  Service: General;  Laterality: Right;  . RIGHT OOPHORECTOMY  03/13/2007  . SUBOCCIPITAL CRANIECTOMY CERVICAL LAMINECTOMY N/A 05/10/2014   Procedure: SUBOCCIPITAL CRANIECTOMY CERVICAL LAMINECTOMY/DURAPLASTY;  Surgeon: [name], MD;  Location: [place] [facility];  Service: Neurosurgery;  Laterality: N/A;  suboccipital craniectomy with cervical laminectomy and duraplasty  . TUBAL LIGATION     Social History   Socioeconomic History  . Marital status: Married    Spouse name: Not on file  . Number of children: 4  . Years of education: Not on file  . Highest education level: Not on file  Occupational History  . Occupation: Environmental manager work    Fish farm manager: [employer]  . Financial resource strain: Not on file  . Food insecurity    Worry: Not on file  Inability: Not on file  . Transportation needs    Medical: Not on file    Non-medical: Not on file  Tobacco Use  . Smoking status: Former Smoker    Packs/day: 0.50    Years: 15.00    Pack years: 7.50    Types: Cigarettes    Quit date: 07/06/1982    Years since quitting: 36.7  . Smokeless tobacco: Never Used  Substance and Sexual Activity  . Alcohol use: No  . Drug use: No  . Sexual activity: Yes    Partners: Male    Birth control/protection: Surgical    Comment: TAH  Lifestyle  . Physical activity    Days per week: Not on file    Minutes per session: Not on file  . Stress: Not  on file  Relationships  . Social Herbalist on phone: Not on file    Gets together: Not on file    Attends religious service: Not on file    Active member of club or organization: Not on file    Attends meetings of clubs or organizations: Not on file    Relationship status: Not on file  Other Topics Concern  . Not on file  Social History Narrative  . Not on file   Family History  Problem Relation Age of Onset  . Aneurysm Mother        Cerebral  . Hypertension Mother   . Hyperlipidemia Mother   . Stroke Mother   . Coronary artery disease Father   . Hypertension Father   . Hyperlipidemia Father   . Heart attack Father        d. 57  . Lung cancer Father        dx early 17s; former smoker  . Lung cancer Brother   . Lung cancer Brother        d. 63y; smoker  . Lung cancer Sister        paternal half-sister; not a smoker; dx <60; d. 41y  . Leukemia Maternal Aunt        "blood cancer"; d. unspecified age  . Cervical cancer Paternal Aunt        d. late 29s  . Breast cancer Sister 88  . Other Sister        hx of hysterectomy for unspecified reason  . Multiple myeloma Brother        d. 49s  . Lung cancer Brother        dx late 37s; smoker  . Prostate cancer Brother        dx late 80s  . Prostate cancer Brother        dx late 52s  . Lung cancer Brother   . Lung cancer Maternal Aunt        d. unspecified age  . Breast cancer Maternal Aunt        dx unspecified age  . Prostate cancer Cousin        maternal 1st cousin; dx older age  . Breast cancer Other        niece dx early 74s or before  . Leukemia Other        nephew d. 2y; "blood cancer"  . Colon cancer Neg Hx   . Diabetes Neg Hx    Allergies  Allergen Reactions  . Doxycycline Itching and Swelling   Prior to Admission medications   Medication Sig Start Date End Date Taking? Authorizing Provider  acyclovir (ZOVIRAX) 400 MG  tablet Take 1 tablet (400 mg total) by mouth 2 (two) times daily. Patient  taking differently: Take 400 mg by mouth daily.  10/07/18  Yes Mikey Kirschner, MD  ALPRAZolam Duanne Moron) 1 MG tablet TAKE 1 TABLET BY MOUTH THREE TIMES A DAY AS NEEDED FOR ANXIETY Patient taking differently: Take 1 mg by mouth 3 (three) times daily as needed for anxiety.  10/18/18  Yes Mikey Kirschner, MD  atenolol (TENORMIN) 50 MG tablet Take 1 tablet (50 mg total) by mouth 2 (two) times daily. 10/18/18  Yes Mikey Kirschner, MD  atorvastatin (LIPITOR) 20 MG tablet Take 1 tablet (20 mg total) by mouth daily at 6 PM. 10/18/18  Yes Luking, Grace Bushy, MD  blood glucose meter kit and supplies KIT Dispense based on patient and insurance preference. Use as directed by physician. Diabetes type 2. E11.9 03/03/16  Yes Mikey Kirschner, MD  blood glucose meter kit and supplies Dispense based on patient and insurance preference. Use up to test glucose once daily. For ICD -10 E11.9. 02/07/18  Yes Mikey Kirschner, MD  cholecalciferol (VITAMIN D3) 25 MCG (1000 UT) tablet Take 1,000 Units by mouth daily.   Yes [provider]  clotrimazole-betamethasone (LOTRISONE) cream Apply 1 application topically 2 (two) times daily. Patient taking differently: Apply 1 application topically 2 (two) times daily as needed (rash).  01/23/19  Yes Causey, Charlestine Massed, NP  escitalopram (LEXAPRO) 20 MG tablet Take 1 tablet (20 mg total) by mouth daily. 10/18/18  Yes Mikey Kirschner, MD  Lancets [facility] DELICA PLUS [id number]) MISC USE TO TEST BLOOD GLUCOSE DAILY 05/20/18  Yes Kathyrn Drown, MD  levocetirizine (XYZAL) 5 MG tablet Take 5 mg by mouth every evening.   Yes [provider]  Melatonin 3 MG CAPS Take 3 mg by mouth at bedtime.   Yes [provider]  metFORMIN (GLUCOPHAGE XR) 500 MG 24 hr tablet Take one tablet by mouth every morning Patient taking differently: Take 500 mg by mouth daily with breakfast.  10/18/18  Yes Mikey Kirschner, MD  ONE TOUCH ULTRA TEST test strip USE TO TEST BLOOD  GLUCOSE DAILY 03/25/18  Yes Mikey Kirschner, MD  pantoprazole (PROTONIX) 40 MG tablet Take 1 tablet (40 mg total) by mouth daily before breakfast. 10/18/18  Yes Mikey Kirschner, MD  EPINEPHrine 0.3 mg/0.3 mL IJ SOAJ injection Inject 0.3 mg into the muscle as needed for anaphylaxis.  04/07/17   [provider]  polyethylene glycol powder (MIRALAX) powder TAKE ONCE CAPFUL AT BEDTIME ON DAYS YOU DO NOT HAVE A GOOD BOWEL MOVEMENT Patient not taking: Reported on 03/07/2019 06/01/18   Mahala Menghini, PA-C  valACYclovir (VALTREX) 1000 MG tablet Take 1 tablet (1,000 mg total) by mouth daily. Patient not taking: Reported on 03/07/2019 09/28/18   Mikey Kirschner, MD  potassium chloride SA (K-DUR,KLOR-CON) 20 MEQ tablet Take 1 tablet (20 mEq total) by mouth 2 (two) times daily. 07/31/13 08/07/13  Wilmon Arms, MD   No results found.  Positive ROS: All other systems have been reviewed and were otherwise negative with the exception of those mentioned in the HPI and as above.  Physical Exam: General: Alert, no acute distress Cardiovascular: No pedal edema Respiratory: No cyanosis, no use of accessory musculature GI: No organomegaly, abdomen is soft and non-tender Skin: No lesions in the area of chief complaint Neurologic: Sensation intact distally Psychiatric: Patient is competent for consent with normal mood and affect Lymphatic: No axillary or  cervical lymphadenopathy  MUSCULOSKELETAL:  Left hip:  Skin is warm and well-perfused with no open lesions or wounds.  She is neurovascularly intact.  Assessment: Left hip trochanteric bursitis  Plan: -Plan for open left hip bursectomy today.  We again reviewed the risk, benefits, and indications of this procedure.  We discussed bleeding, infection, recalcitrant pain, DVT, and the risk of anesthesia.  She has provided informed consent to proceed. -We will plan on discharge home from PACU.  She can weight-bear as tolerated postoperatively.     Nicholes Stairs, MD Cell [phone]    03/17/2019 12:21 PM

## 2019-03-17 NOTE — Anesthesia Postprocedure Evaluation (Signed)
Anesthesia Post Note  Patient: [name] [name]  Procedure(s) Performed: Left hip open bursectomy (Left Hip)     Anesthesia Type: General    Last Vitals:  Vitals:   03/17/19 1400 03/17/19 1409  BP:  122/80  Pulse: 82 73  Resp: (!) 21 20  Temp:    SpO2: 94% 100%    Last Pain:  Vitals:   03/17/19 1430  TempSrc:   PainSc: [place]

## 2019-03-17 NOTE — Discharge Instructions (Signed)
Orthopedic discharge instructions: -Okay for full weightbearing as tolerated to the left lower extremity. -Maintain your postoperative bandage for 2 weeks.  You may shower with this bandage in place but do not submerge underwater.  -Apply ice to the left hip incision for 30 minutes at a time throughout the day as you can.  -For mild to moderate pain use Tylenol and/or Advil around-the-clock.  For breakthrough pain use the Norco as directed.  -Return to see Dr. Stann Mainland in 2 weeks for routine postoperative check.

## 2019-03-17 NOTE — Transfer of Care (Signed)
Immediate Anesthesia Transfer of Care Note  Patient: [name] [name]  Procedure(s) Performed: Left hip open bursectomy (Left Hip)  Patient Location: PACU  Anesthesia Type:General  Level of Consciousness: awake, alert , oriented and patient cooperative  Airway & Oxygen Therapy: Patient Spontanous Breathing and Patient connected to face mask oxygen  Post-op Assessment: Report given to RN and Post -op Vital signs reviewed and stable  Post vital signs: Reviewed and stable  Last Vitals:  Vitals Value Taken Time  BP 132/72 03/17/19 1354  Temp    Pulse 87 03/17/19 1355  Resp 29 03/17/19 1355  SpO2 98 % 03/17/19 1355  Vitals shown include unvalidated device data.  Last Pain:  Vitals:   03/17/19 1052  TempSrc:   PainSc: 0-No pain      Patients Stated Pain Goal: 4 ([id number])  Complications: No apparent anesthesia complications

## 2019-03-17 NOTE — Progress Notes (Signed)
Orthopedic Tech Progress Note Patient Details:  [name] [name] [birth year] [id number] PACU RN called requesting crutches for patient Ortho Devices Type of Ortho Device: Crutches Ortho Device/Splint Interventions: Adjustment   Post Interventions Patient Tolerated: Ambulated well, Well Instructions Provided: Care of device, Adjustment of device   Janit Pagan 03/17/2019, 2:50 PM

## 2019-03-17 NOTE — Anesthesia Procedure Notes (Signed)
Procedure Name: Intubation Date/Time: 03/17/2019 12:47 PM Performed by: Cleda Daub, CRNA Pre-anesthesia Checklist: Patient identified, Emergency Drugs available, Suction available and Patient being monitored Patient Re-evaluated:Patient Re-evaluated prior to induction Oxygen Delivery Method: Circle system utilized Preoxygenation: Pre-oxygenation with 100% oxygen Induction Type: IV induction Laryngoscope Size: Miller and 2 Grade View: Grade I Tube size: 7.0 mm Number of attempts: 1 Airway Equipment and Method: Stylet Placement Confirmation: ETT inserted through vocal cords under direct vision,  positive ETCO2 and breath sounds checked- equal and bilateral Secured at: 21 cm Tube secured with: Tape Dental Injury: Teeth and Oropharynx as per pre-operative assessment

## 2019-03-18 ENCOUNTER — Encounter (HOSPITAL_COMMUNITY): Payer: Self-pay | Admitting: Orthopedic Surgery

## 2019-03-21 ENCOUNTER — Encounter (HOSPITAL_COMMUNITY): Payer: Medicare HMO

## 2019-03-23 ENCOUNTER — Encounter (HOSPITAL_COMMUNITY): Payer: Medicare HMO | Admitting: Occupational Therapy

## 2019-03-28 ENCOUNTER — Encounter (HOSPITAL_COMMUNITY): Payer: Medicare HMO | Admitting: Occupational Therapy

## 2019-03-30 ENCOUNTER — Encounter (HOSPITAL_COMMUNITY): Payer: Medicare HMO | Admitting: Occupational Therapy

## 2019-03-30 DIAGNOSIS — Z4789 Encounter for other orthopedic aftercare: Secondary | ICD-10-CM | POA: Insufficient documentation

## 2019-04-05 IMAGING — US US PELVIS COMPLETE
1 series · 14 of 25 positions shown · non-contrast
Comparison: 04/15/2016 CT.  Outside pelvic sonogram 12/02/2016.

CLINICAL DATA: 56-year-old female with left lower quadrant pain
with nausea for the past 2 weeks. Prior hysterectomy and
oophorectomy. Uterine cancer. Initial encounter.

EXAM:
TRANSABDOMINAL AND TRANSVAGINAL ULTRASOUND OF PELVIS
TECHNIQUE: Both transabdominal and transvaginal ultrasound examinations of the
pelvis were performed. Transabdominal technique was performed for
global imaging of the pelvis including uterus, ovaries, adnexal
regions, and pelvic cul-de-sac. It was necessary to proceed with
endovaginal exam following the transabdominal exam to visualize the
left ovary..

[Series 1: us pelvis complete · 0.21mm/px · 14 of 67 slices shown]
[im 1/67]
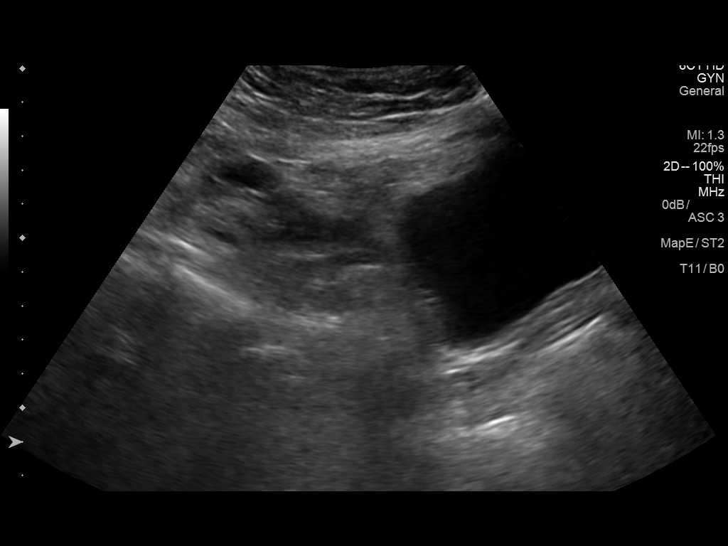
[im 6/67]
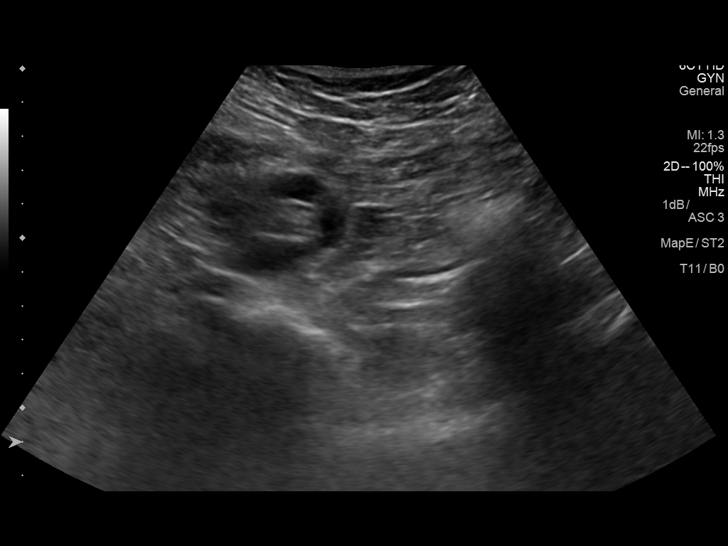
[im 12/67]
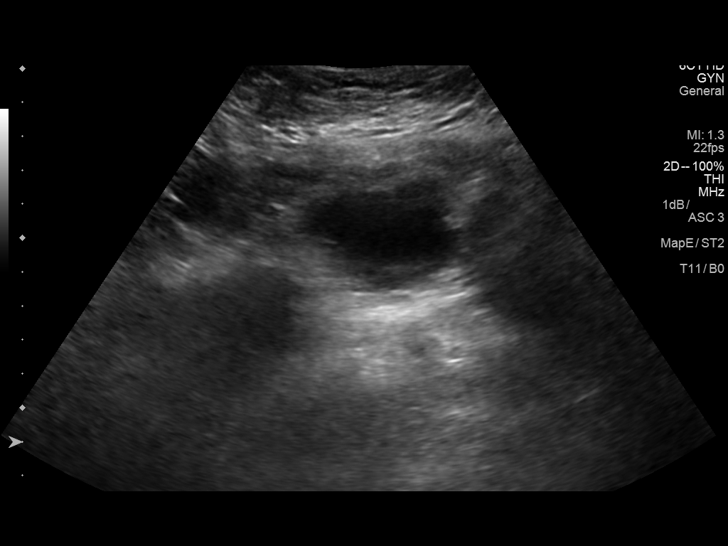
[im 17/67]
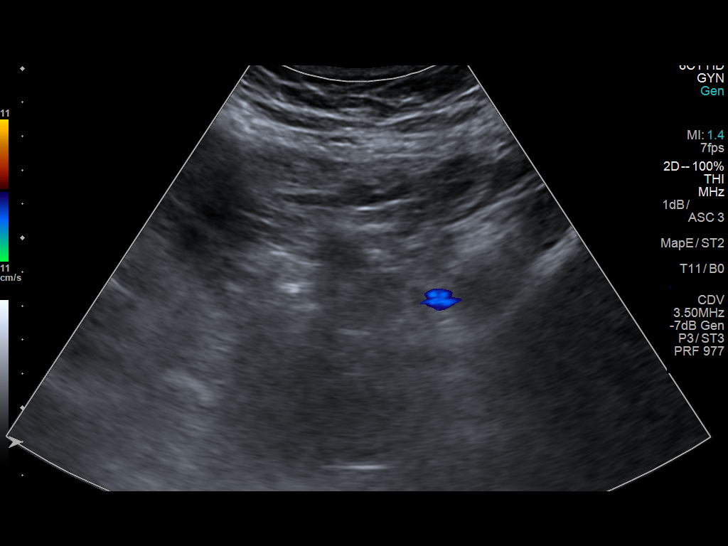
[im 23/67]
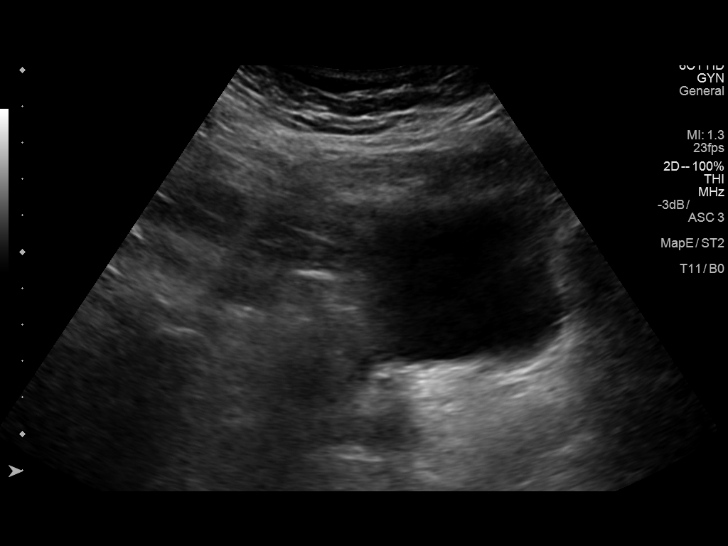
[im 25/67]
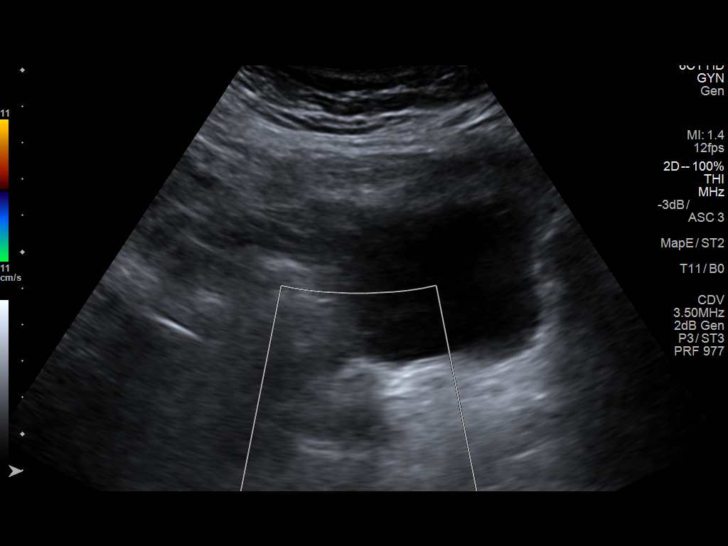
[im 31/67]
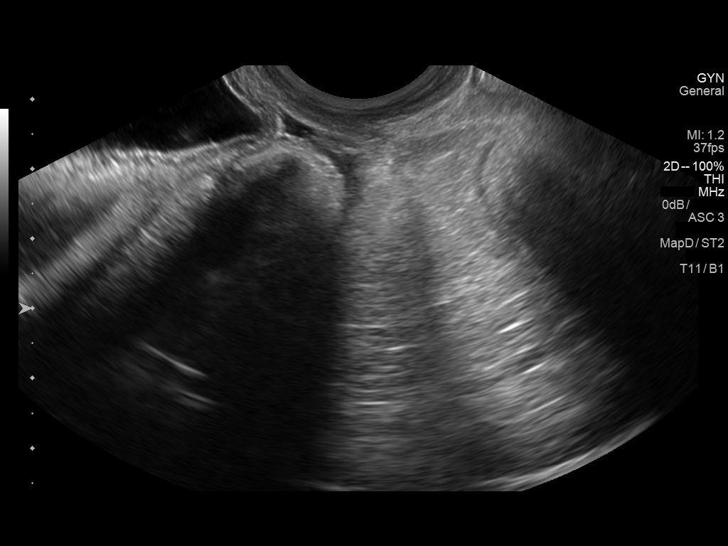
[im 36/67]
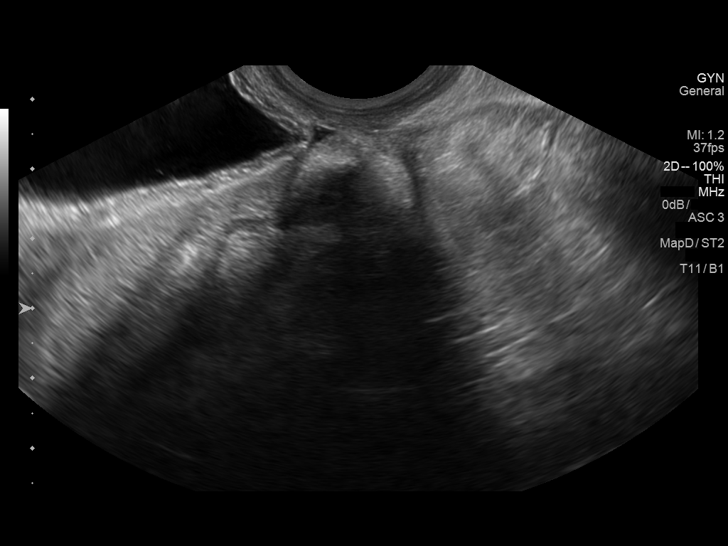
[im 42/67]
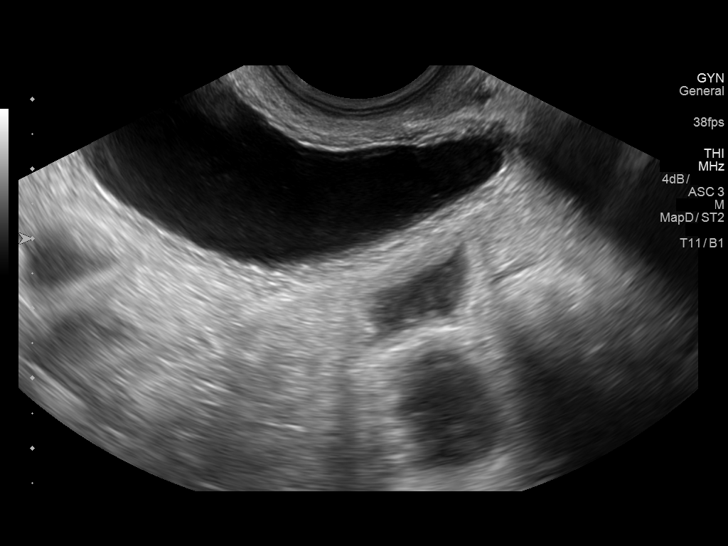
[im 45/67]
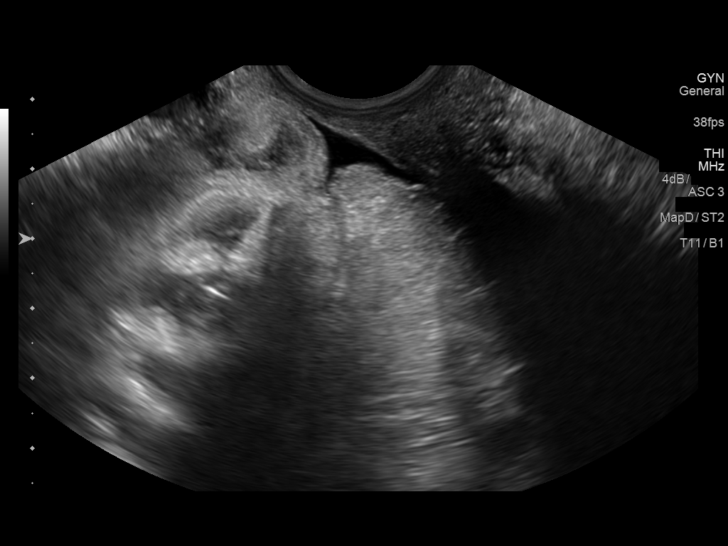
[im 50/67]
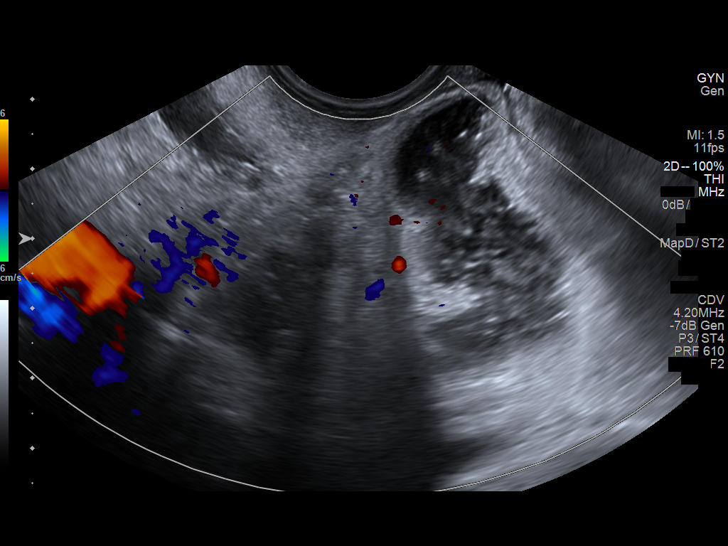
[im 56/67]
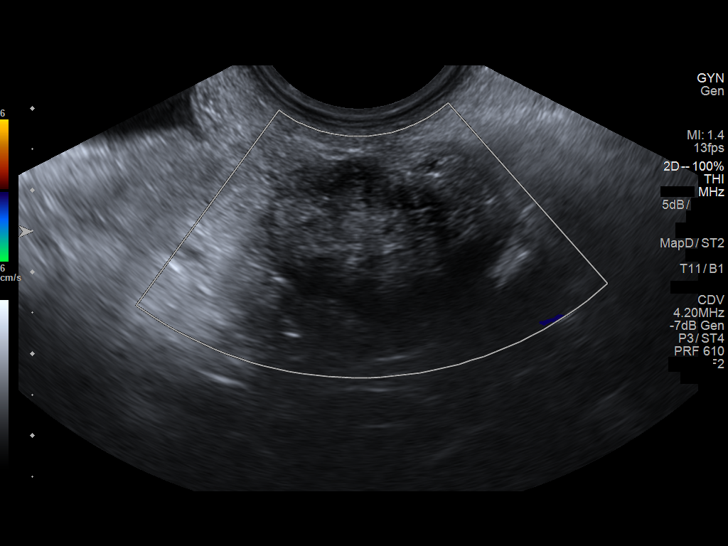
[im 61/67]
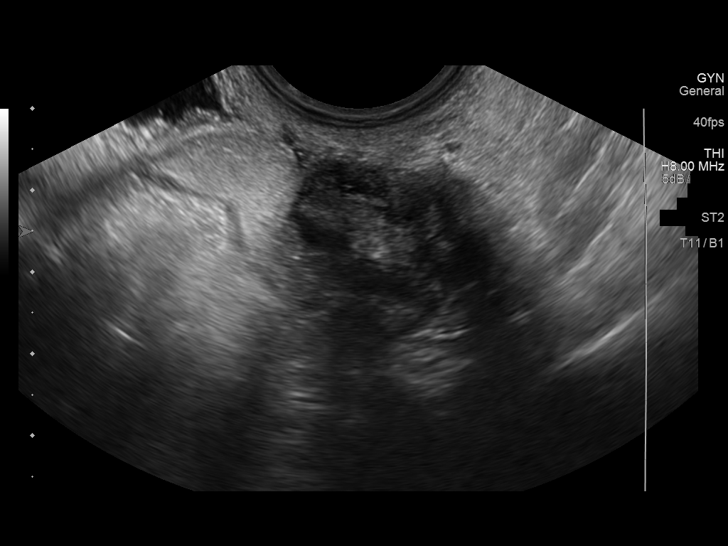
[im 67/67]
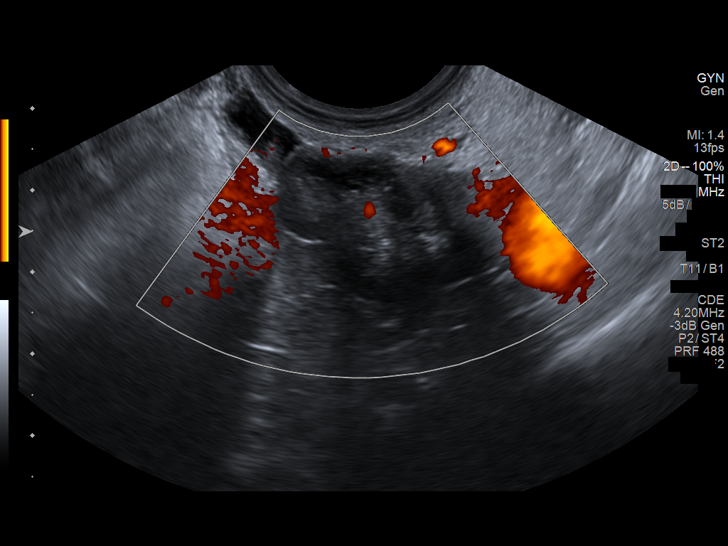

[14 of 25 positions shown; findings below may reference images not displayed]

FINDINGS: Uterus

Post hysterectomy.

Right ovary

Post right oophorectomy.

Left ovary

Measurements: 2.4 x 1.9 x 2.6 cm. Normal appearance/no adnexal mass.

Other findings

Trace free fluid.
IMPRESSION: Post hysterectomy and right oophorectomy.

Left ovary was difficult to separate from bowel however, no left
ovarian mass identified.

Trace free fluid.

## 2019-04-08 IMAGING — CR DG CHEST 2V
2 series · 2 of 2 positions shown · non-contrast
Comparison: 04/15/2016

CLINICAL DATA: Shortness of breath 2 months. Right scapular pain 2
weeks. History of breast cancer.

EXAM:
CHEST  2 VIEW

[w chest pa]
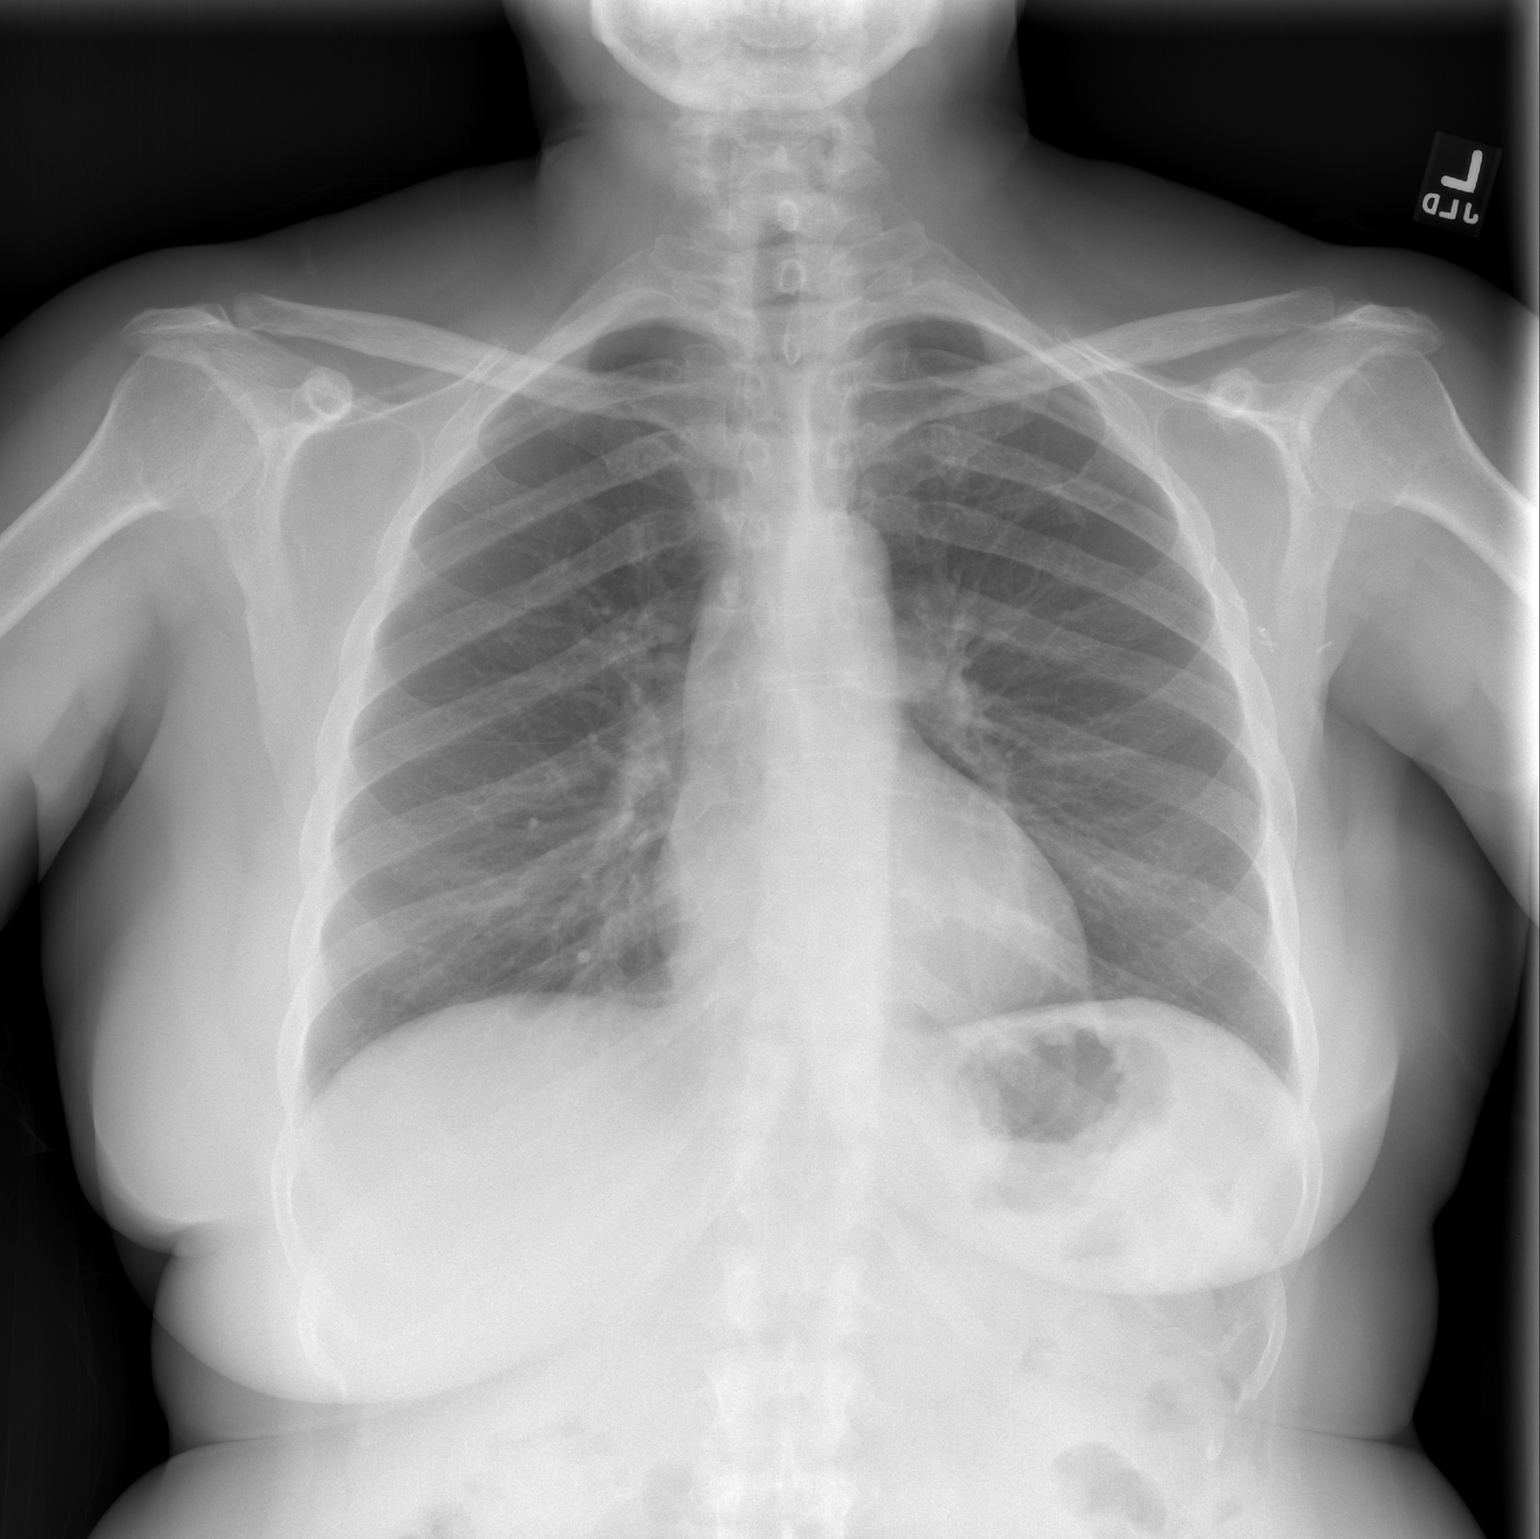

[w chest lat]
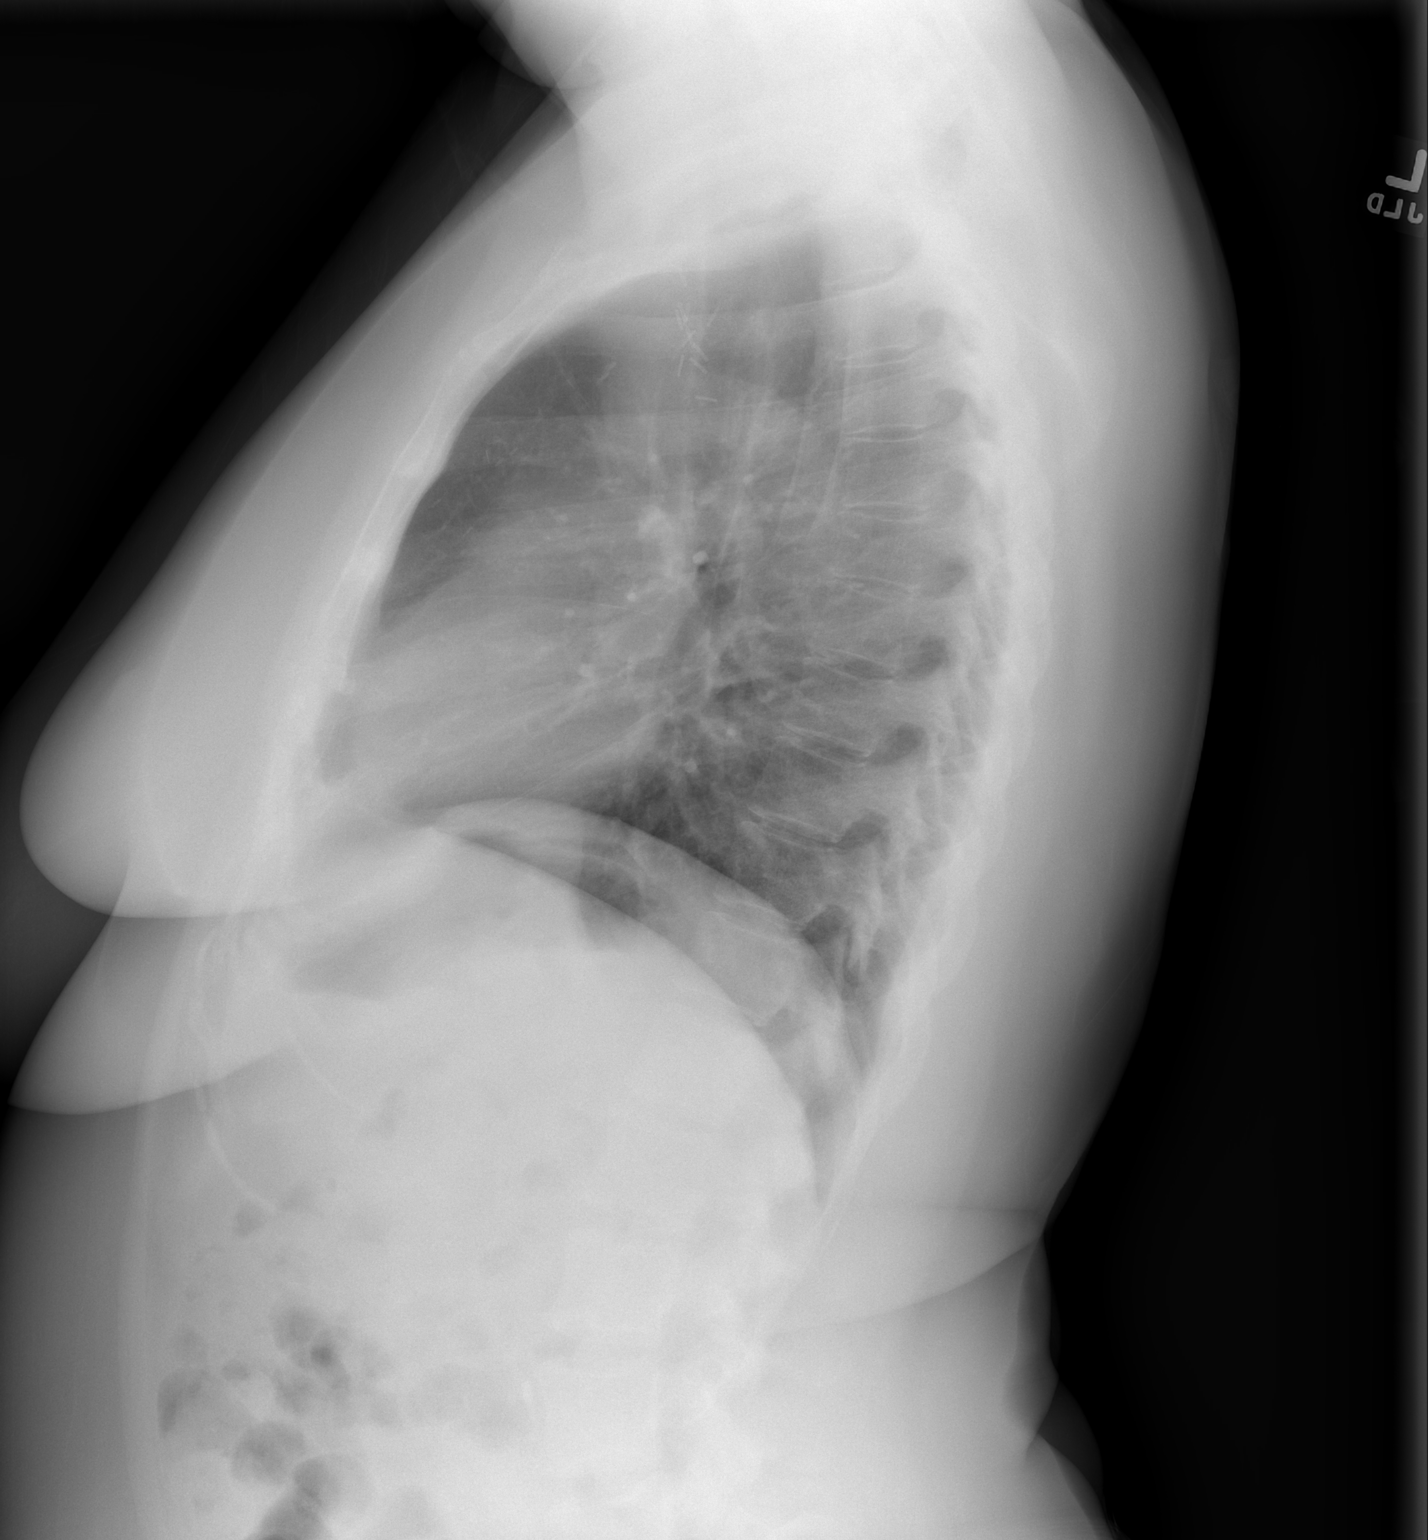

[2 of 2 positions shown; findings below may reference images not displayed]

FINDINGS: Lungs are adequately inflated and otherwise clear. Cardiomediastinal
silhouette is within normal. Evidence of previous left lumpectomy
and axillary node dissection.
IMPRESSION: No active cardiopulmonary disease.

## 2019-04-21 ENCOUNTER — Telehealth: Payer: Self-pay | Admitting: Family Medicine

## 2019-04-21 NOTE — Telephone Encounter (Signed)
Six mo 

## 2019-04-21 NOTE — Telephone Encounter (Signed)
Pt requesting refill on ALPRAZolam (XANAX) 1 MG tablet  Please advise & call pt     [facility]

## 2019-04-21 NOTE — Telephone Encounter (Signed)
Pt states she needs a refill on Lexapro 20 mg also. Please advise. Thank you

## 2019-04-21 NOTE — Telephone Encounter (Signed)
Last seen 11/01/2018. Please advise. Thank you

## 2019-04-21 NOTE — Telephone Encounter (Signed)
Ok six mo worth 

## 2019-04-24 ENCOUNTER — Other Ambulatory Visit: Payer: Self-pay | Admitting: *Deleted

## 2019-04-24 MED ORDER — ALPRAZOLAM 1 MG PO TABS: ORAL_TABLET | ORAL | 5 refills | Status: DC

## 2019-04-24 MED ORDER — ESCITALOPRAM OXALATE 20 MG PO TABS: 20.0000 mg | ORAL_TABLET | Freq: Every day | ORAL | 1 refills | Status: DC

## 2019-04-24 NOTE — Telephone Encounter (Signed)
Refills sent to pharm. Pt notified.  

## 2019-04-24 NOTE — Telephone Encounter (Signed)
lexapro sent to pharm. Xanax printed. Await dr Richardson Landry signature then will fax and call pt.

## 2019-05-02 DIAGNOSIS — R69 Illness, unspecified: Secondary | ICD-10-CM | POA: Diagnosis not present

## 2019-05-14 DIAGNOSIS — M1612 Unilateral primary osteoarthritis, left hip: Secondary | ICD-10-CM | POA: Insufficient documentation

## 2019-05-18 ENCOUNTER — Ambulatory Visit (INDEPENDENT_AMBULATORY_CARE_PROVIDER_SITE_OTHER): Payer: Medicare HMO | Admitting: Otolaryngology

## 2019-05-31 DIAGNOSIS — M25552 Pain in left hip: Secondary | ICD-10-CM | POA: Diagnosis not present

## 2019-06-12 ENCOUNTER — Ambulatory Visit (INDEPENDENT_AMBULATORY_CARE_PROVIDER_SITE_OTHER): Payer: Medicare HMO | Admitting: Otolaryngology

## 2019-07-17 ENCOUNTER — Other Ambulatory Visit (HOSPITAL_COMMUNITY): Payer: Self-pay | Admitting: Family Medicine

## 2019-07-17 DIAGNOSIS — Z1231 Encounter for screening mammogram for malignant neoplasm of breast: Secondary | ICD-10-CM

## 2019-07-24 ENCOUNTER — Encounter: Payer: Self-pay | Admitting: Family Medicine

## 2019-07-24 ENCOUNTER — Ambulatory Visit: Payer: Medicare HMO | Admitting: Family Medicine

## 2019-07-24 ENCOUNTER — Other Ambulatory Visit: Payer: Self-pay

## 2019-07-24 ENCOUNTER — Ambulatory Visit (INDEPENDENT_AMBULATORY_CARE_PROVIDER_SITE_OTHER): Payer: Medicare HMO | Admitting: Family Medicine

## 2019-07-24 VITALS — BP 132/84 | Temp 97.0°F | Wt 175.0 lb

## 2019-07-24 DIAGNOSIS — N644 Mastodynia: Secondary | ICD-10-CM

## 2019-07-24 NOTE — Progress Notes (Signed)
   Subjective:    Patient ID: [name] [name], female    DOB: 10-14-1960, 59 y.o.   MRN: [id number]  HPI Pt here today for swelling and tenderness in underarm and breast area on both sides. Pt states she noticed the swelling and tenderness in November. Pt has not tried any treatments.   Pt has noticed some sensitivity in the left sid eof the breast sinc3 November  Pt due the first of march    Patient notes sensitivity in both breasts. Left greater than right. History of remote breast cancer  Saw her cancer specialist just last summer. At that time due to her anxiety about her overall condition they ordered a bone scan and a PET scan which returned completely normal  Due to have regular screening mammograms start of March Review of Systems No headache no chest pain no shortness of breath    Objective:   Physical Exam  Alert vitals stable, NAD. Blood pressure good on repeat. HEENT normal. Lungs clear. Heart regular rate and rhythm. No palp mass, diffuse tend both breast left greater than right, no palp axillary masses      Assessment & Plan:  Impression nonspecific discomfort. No palp mass. Pos hx of breast cancer Patient admits she is feeling anxious about it.  Had a fairly substantial work-up 6 months ago.  Did have screening mammogram in 6 weeks.  Breast changes a diagnostic mammogram ASAP for

## 2019-08-08 ENCOUNTER — Ambulatory Visit (HOSPITAL_COMMUNITY)
Admission: RE | Admit: 2019-08-08 | Discharge: 2019-08-08 | Disposition: A | Discharge status: Home / Self Care | Payer: Medicare HMO | Source: Ambulatory Visit | Attending: Family Medicine | Admitting: Family Medicine

## 2019-08-08 ENCOUNTER — Ambulatory Visit (HOSPITAL_COMMUNITY): Payer: Medicare HMO

## 2019-08-08 ENCOUNTER — Other Ambulatory Visit: Payer: Self-pay

## 2019-08-08 ENCOUNTER — Encounter (HOSPITAL_COMMUNITY): Payer: Medicare HMO

## 2019-08-08 DIAGNOSIS — Z853 Personal history of malignant neoplasm of breast: Secondary | ICD-10-CM | POA: Diagnosis not present

## 2019-08-08 DIAGNOSIS — N644 Mastodynia: Secondary | ICD-10-CM | POA: Insufficient documentation

## 2019-08-08 DIAGNOSIS — R928 Other abnormal and inconclusive findings on diagnostic imaging of breast: Secondary | ICD-10-CM | POA: Diagnosis not present

## 2019-08-09 ENCOUNTER — Encounter: Payer: Self-pay | Admitting: Family Medicine

## 2019-08-10 DIAGNOSIS — D2321 Other benign neoplasm of skin of right ear and external auricular canal: Secondary | ICD-10-CM | POA: Diagnosis not present

## 2019-08-10 DIAGNOSIS — H6121 Impacted cerumen, right ear: Secondary | ICD-10-CM | POA: Diagnosis not present

## 2019-08-21 ENCOUNTER — Other Ambulatory Visit: Payer: Self-pay | Admitting: Otolaryngology

## 2019-08-28 ENCOUNTER — Other Ambulatory Visit: Payer: Self-pay

## 2019-08-28 ENCOUNTER — Encounter (HOSPITAL_BASED_OUTPATIENT_CLINIC_OR_DEPARTMENT_OTHER): Payer: Self-pay | Admitting: Otolaryngology

## 2019-08-31 ENCOUNTER — Encounter (HOSPITAL_BASED_OUTPATIENT_CLINIC_OR_DEPARTMENT_OTHER)
Admission: RE | Admit: 2019-08-31 | Discharge: 2019-08-31 | Disposition: A | Discharge status: Home / Self Care | Payer: Medicare HMO | Source: Ambulatory Visit | Attending: Otolaryngology | Admitting: Otolaryngology

## 2019-08-31 ENCOUNTER — Other Ambulatory Visit (HOSPITAL_COMMUNITY)
Admission: RE | Admit: 2019-08-31 | Discharge: 2019-08-31 | Disposition: A | Discharge status: Home / Self Care | Payer: Medicare HMO | Source: Ambulatory Visit | Attending: Otolaryngology | Admitting: Otolaryngology

## 2019-08-31 ENCOUNTER — Other Ambulatory Visit: Payer: Self-pay

## 2019-08-31 DIAGNOSIS — H938X1 Other specified disorders of right ear: Secondary | ICD-10-CM | POA: Diagnosis present

## 2019-08-31 DIAGNOSIS — L720 Epidermal cyst: Secondary | ICD-10-CM | POA: Diagnosis not present

## 2019-08-31 DIAGNOSIS — I10 Essential (primary) hypertension: Secondary | ICD-10-CM | POA: Diagnosis not present

## 2019-08-31 DIAGNOSIS — Z20822 Contact with and (suspected) exposure to covid-19: Secondary | ICD-10-CM | POA: Insufficient documentation

## 2019-08-31 DIAGNOSIS — Z87891 Personal history of nicotine dependence: Secondary | ICD-10-CM | POA: Diagnosis not present

## 2019-08-31 DIAGNOSIS — D2321 Other benign neoplasm of skin of right ear and external auricular canal: Secondary | ICD-10-CM | POA: Diagnosis not present

## 2019-08-31 DIAGNOSIS — Z01812 Encounter for preprocedural laboratory examination: Secondary | ICD-10-CM | POA: Diagnosis not present

## 2019-08-31 DIAGNOSIS — M1990 Unspecified osteoarthritis, unspecified site: Secondary | ICD-10-CM | POA: Diagnosis not present

## 2019-08-31 LAB — BASIC METABOLIC PANEL
Anion gap: 12 (ref 5–15)
BUN: 10 mg/dL (ref 6–20)
CO2: 25 mmol/L (ref 22–32)
Calcium: 9.3 mg/dL (ref 8.9–10.3)
Chloride: 106 mmol/L (ref 98–111)
Creatinine, Ser: 1 mg/dL (ref 0.44–1.00)
GFR calc Af Amer: >60 mL/min (ref >60)
GFR calc non Af Amer: >60 mL/min (ref >60)
Glucose, Bld: 85 mg/dL (ref 70–99)
Potassium: 4.3 mmol/L (ref 3.5–5.1)
Sodium: 143 mmol/L (ref 135–145)

## 2019-08-31 LAB — SARS CORONAVIRUS 2 (TAT 6-24 HRS): SARS Coronavirus 2: NEGATIVE

## 2019-09-03 NOTE — Anesthesia Preprocedure Evaluation (Addendum)
Anesthesia Evaluation  Patient identified by MRN, date of birth, ID band Patient awake    Reviewed: Allergy & Precautions, NPO status , Patient's Chart, lab work & pertinent test results  Airway Mallampati: II  TM Distance: >3 FB Neck ROM: Full    Dental no notable dental hx.    Pulmonary neg pulmonary ROS, former smoker,    Pulmonary exam normal breath sounds clear to auscultation       Cardiovascular hypertension, Pt. on medications negative cardio ROS Normal cardiovascular exam Rhythm:Regular Rate:Normal     Neuro/Psych  Headaches, Anxiety Depression negative neurological ROS  negative psych ROS   GI/Hepatic negative GI ROS, Neg liver ROS, GERD  ,  Endo/Other  negative endocrine ROSdiabetes, Type 2  Renal/GU negative Renal ROS  negative genitourinary   Musculoskeletal negative musculoskeletal ROS (+) Arthritis ,   Abdominal (+) + obese,   Peds negative pediatric ROS (+)  Hematology negative hematology ROS (+)   Anesthesia Other Findings   Reproductive/Obstetrics negative OB ROS                           Anesthesia Physical Anesthesia Plan  ASA: III  Anesthesia Plan: General   Post-op Pain Management:    Induction: Intravenous  PONV Risk Score and Plan: 3 and Treatment may vary due to age or medical condition, Ondansetron, Dexamethasone and Midazolam  Airway Management Planned: LMA  Additional Equipment: None  Intra-op Plan:   Post-operative Plan:   Informed Consent:     Dental advisory given  Plan Discussed with:   Anesthesia Plan Comments:         Anesthesia Quick Evaluation

## 2019-09-04 ENCOUNTER — Other Ambulatory Visit: Payer: Self-pay

## 2019-09-04 ENCOUNTER — Encounter (HOSPITAL_BASED_OUTPATIENT_CLINIC_OR_DEPARTMENT_OTHER)
Admission: RE | Disposition: A | Discharge status: Home / Self Care | Payer: Self-pay | Source: Home / Self Care | Attending: Otolaryngology

## 2019-09-04 ENCOUNTER — Ambulatory Visit (HOSPITAL_BASED_OUTPATIENT_CLINIC_OR_DEPARTMENT_OTHER): Payer: Medicare HMO | Admitting: Anesthesiology

## 2019-09-04 ENCOUNTER — Encounter (HOSPITAL_BASED_OUTPATIENT_CLINIC_OR_DEPARTMENT_OTHER): Payer: Self-pay | Admitting: Otolaryngology

## 2019-09-04 ENCOUNTER — Ambulatory Visit (HOSPITAL_COMMUNITY): Payer: Medicare HMO

## 2019-09-04 ENCOUNTER — Ambulatory Visit (HOSPITAL_BASED_OUTPATIENT_CLINIC_OR_DEPARTMENT_OTHER)
Admission: RE | Admit: 2019-09-04 | Discharge: 2019-09-04 | Disposition: A | Discharge status: Home / Self Care | Payer: Medicare HMO | Attending: Otolaryngology | Admitting: Otolaryngology

## 2019-09-04 DIAGNOSIS — D2321 Other benign neoplasm of skin of right ear and external auricular canal: Secondary | ICD-10-CM | POA: Insufficient documentation

## 2019-09-04 DIAGNOSIS — I10 Essential (primary) hypertension: Secondary | ICD-10-CM | POA: Insufficient documentation

## 2019-09-04 DIAGNOSIS — Z87891 Personal history of nicotine dependence: Secondary | ICD-10-CM | POA: Insufficient documentation

## 2019-09-04 DIAGNOSIS — H938X1 Other specified disorders of right ear: Secondary | ICD-10-CM | POA: Diagnosis not present

## 2019-09-04 DIAGNOSIS — Q181 Preauricular sinus and cyst: Secondary | ICD-10-CM | POA: Diagnosis not present

## 2019-09-04 DIAGNOSIS — H6121 Impacted cerumen, right ear: Secondary | ICD-10-CM | POA: Diagnosis not present

## 2019-09-04 DIAGNOSIS — L720 Epidermal cyst: Secondary | ICD-10-CM | POA: Diagnosis not present

## 2019-09-04 DIAGNOSIS — M1990 Unspecified osteoarthritis, unspecified site: Secondary | ICD-10-CM | POA: Insufficient documentation

## 2019-09-04 DIAGNOSIS — E7800 Pure hypercholesterolemia, unspecified: Secondary | ICD-10-CM | POA: Diagnosis not present

## 2019-09-04 HISTORY — PX: EAR CYST EXCISION: SHX22

## 2019-09-04 LAB — GLUCOSE, CAPILLARY
Glucose-Capillary: 100 mg/dL — ABNORMAL HIGH (ref 70–99)
Glucose-Capillary: 94 mg/dL (ref 70–99)

## 2019-09-04 SURGERY — EXCISION, CYST, EAR
Anesthesia: General | Site: Ear | Laterality: Right

## 2019-09-04 MED ORDER — FENTANYL CITRATE (PF) 100 MCG/2ML IJ SOLN
INTRAMUSCULAR | Status: DC | PRN
  Administered 2019-09-04: 50 ug via INTRAVENOUS

## 2019-09-04 MED ORDER — MUPIROCIN 2 % EX OINT
TOPICAL_OINTMENT | CUTANEOUS | Status: AC
  Filled 2019-09-04: qty 22

## 2019-09-04 MED ORDER — AMOXICILLIN 875 MG PO TABS: 875.0000 mg | ORAL_TABLET | Freq: Two times a day (BID) | ORAL | 0 refills | Status: AC

## 2019-09-04 MED ORDER — OXYCODONE HCL 5 MG/5ML PO SOLN: 5.0000 mg | Freq: Once | ORAL | Status: DC | PRN

## 2019-09-04 MED ORDER — FENTANYL CITRATE (PF) 100 MCG/2ML IJ SOLN
INTRAMUSCULAR | Status: AC
  Filled 2019-09-04: qty 2

## 2019-09-04 MED ORDER — PROMETHAZINE HCL 25 MG/ML IJ SOLN: 6.2500 mg | INTRAMUSCULAR | Status: DC | PRN

## 2019-09-04 MED ORDER — ACETAMINOPHEN 10 MG/ML IV SOLN: 1000.0000 mg | Freq: Once | INTRAVENOUS | Status: DC | PRN

## 2019-09-04 MED ORDER — FENTANYL CITRATE (PF) 100 MCG/2ML IJ SOLN: 25.0000 ug | INTRAMUSCULAR | Status: DC | PRN

## 2019-09-04 MED ORDER — MIDAZOLAM HCL 5 MG/5ML IJ SOLN
INTRAMUSCULAR | Status: DC | PRN
  Administered 2019-09-04: 2 mg via INTRAVENOUS

## 2019-09-04 MED ORDER — CEFAZOLIN SODIUM-DEXTROSE 2-3 GM-%(50ML) IV SOLR
INTRAVENOUS | Status: DC | PRN
  Administered 2019-09-04: 2 g via INTRAVENOUS

## 2019-09-04 MED ORDER — LIDOCAINE-EPINEPHRINE 2 %-1:100000 IJ SOLN
INTRAMUSCULAR | Status: AC
  Filled 2019-09-04: qty 1

## 2019-09-04 MED ORDER — LIDOCAINE-EPINEPHRINE 1 %-1:100000 IJ SOLN
INTRAMUSCULAR | Status: DC | PRN
  Administered 2019-09-04: 1.5 mL via INTRADERMAL

## 2019-09-04 MED ORDER — MEPERIDINE HCL 25 MG/ML IJ SOLN: 6.2500 mg | INTRAMUSCULAR | Status: DC | PRN

## 2019-09-04 MED ORDER — OXYCODONE HCL 5 MG PO TABS: 5.0000 mg | ORAL_TABLET | Freq: Once | ORAL | Status: DC | PRN

## 2019-09-04 MED ORDER — BSS IO SOLN
INTRAOCULAR | Status: AC
  Filled 2019-09-04: qty 15

## 2019-09-04 MED ORDER — HYDROMORPHONE HCL 1 MG/ML IJ SOLN: 0.2500 mg | INTRAMUSCULAR | Status: DC | PRN

## 2019-09-04 MED ORDER — EPINEPHRINE PF 1 MG/ML IJ SOLN
INTRAMUSCULAR | Status: AC
  Filled 2019-09-04: qty 1

## 2019-09-04 MED ORDER — DEXAMETHASONE SODIUM PHOSPHATE 4 MG/ML IJ SOLN
INTRAMUSCULAR | Status: DC | PRN
  Administered 2019-09-04: 10 mg via INTRAVENOUS

## 2019-09-04 MED ORDER — ONDANSETRON HCL 4 MG/2ML IJ SOLN
INTRAMUSCULAR | Status: AC
  Filled 2019-09-04: qty 6

## 2019-09-04 MED ORDER — PROPOFOL 10 MG/ML IV BOLUS
INTRAVENOUS | Status: AC
  Filled 2019-09-04: qty 20

## 2019-09-04 MED ORDER — METHYLENE BLUE 0.5 % INJ SOLN
INTRAVENOUS | Status: AC
  Filled 2019-09-04: qty 10

## 2019-09-04 MED ORDER — BACITRACIN ZINC 500 UNIT/GM EX OINT
TOPICAL_OINTMENT | CUTANEOUS | Status: AC
  Filled 2019-09-04: qty 0.9

## 2019-09-04 MED ORDER — LIDOCAINE 2% (20 MG/ML) 5 ML SYRINGE
INTRAMUSCULAR | Status: DC | PRN
  Administered 2019-09-04: 60 mg via INTRAVENOUS

## 2019-09-04 MED ORDER — ONDANSETRON HCL 4 MG/2ML IJ SOLN
INTRAMUSCULAR | Status: DC | PRN
  Administered 2019-09-04: 4 mg via INTRAVENOUS

## 2019-09-04 MED ORDER — OXYMETAZOLINE HCL 0.05 % NA SOLN
NASAL | Status: AC
  Filled 2019-09-04: qty 30

## 2019-09-04 MED ORDER — LACTATED RINGERS IV SOLN: INTRAVENOUS | Status: DC

## 2019-09-04 MED ORDER — CIPROFLOXACIN-FLUOCINOLONE PF 0.3-0.025 % OT SOLN
OTIC | Status: AC
  Filled 2019-09-04: qty 0.25

## 2019-09-04 MED ORDER — MIDAZOLAM HCL 2 MG/2ML IJ SOLN
INTRAMUSCULAR | Status: AC
  Filled 2019-09-04: qty 2

## 2019-09-04 MED ORDER — ONDANSETRON HCL 4 MG/2ML IJ SOLN: 4.0000 mg | Freq: Once | INTRAMUSCULAR | Status: DC | PRN

## 2019-09-04 MED ORDER — LIDOCAINE-EPINEPHRINE 1 %-1:100000 IJ SOLN
INTRAMUSCULAR | Status: AC
  Filled 2019-09-04: qty 1

## 2019-09-04 MED ORDER — SILVER NITRATE-POT NITRATE 75-25 % EX MISC
CUTANEOUS | Status: AC
  Filled 2019-09-04: qty 10

## 2019-09-04 MED ORDER — PROPOFOL 10 MG/ML IV BOLUS
INTRAVENOUS | Status: DC | PRN
  Administered 2019-09-04: 150 mg via INTRAVENOUS

## 2019-09-04 SURGICAL SUPPLY — 39 items
APL SWBSTK 6 STRL LF DISP (MISCELLANEOUS)
APPLICATOR COTTON TIP 6 STRL (MISCELLANEOUS) IMPLANT
APPLICATOR COTTON TIP 6IN STRL (MISCELLANEOUS)
BALL CTTN LRG ABS STRL LF (GAUZE/BANDAGES/DRESSINGS) ×1
BLADE SURG 15 STRL LF DISP TIS (BLADE) ×1 IMPLANT
BLADE SURG 15 STRL SS (BLADE) ×2
CANISTER SUCT 1200ML W/VALVE (MISCELLANEOUS) ×2 IMPLANT
COTTONBALL LRG STERILE PKG (GAUZE/BANDAGES/DRESSINGS) ×2 IMPLANT
COVER BACK TABLE 60X90IN (DRAPES) ×2 IMPLANT
COVER MAYO STAND STRL (DRAPES) ×2 IMPLANT
COVER WAND RF STERILE (DRAPES) IMPLANT
DECANTER SPIKE VIAL GLASS SM (MISCELLANEOUS) ×2 IMPLANT
DRAPE IMP U-DRAPE 54X76 (DRAPES) ×2 IMPLANT
DRAPE MICROSCOPE WILD 40.5X102 (DRAPES) IMPLANT
ELECT COATED BLADE 2.86 ST (ELECTRODE) IMPLANT
ELECT NEEDLE BLADE 2-5/6 (NEEDLE) ×2 IMPLANT
ELECT REM PT RETURN 9FT ADLT (ELECTROSURGICAL) ×2
ELECTRODE REM PT RTRN 9FT ADLT (ELECTROSURGICAL) ×1 IMPLANT
GAUZE SPONGE 4X4 12PLY STRL LF (GAUZE/BANDAGES/DRESSINGS) ×2 IMPLANT
GAUZE XEROFORM 5X9 LF (GAUZE/BANDAGES/DRESSINGS) ×2 IMPLANT
GLOVE BIO SURGEON STRL SZ7.5 (GLOVE) ×2 IMPLANT
GOWN STRL REUS W/ TWL LRG LVL3 (GOWN DISPOSABLE) ×1 IMPLANT
GOWN STRL REUS W/TWL LRG LVL3 (GOWN DISPOSABLE) ×2
IV SET EXT 30 76VOL 4 MALE LL (IV SETS) IMPLANT
NEEDLE BLUNT 17GA (NEEDLE) IMPLANT
NEEDLE PRECISIONGLIDE 27X1.5 (NEEDLE) ×2 IMPLANT
NS IRRIG 1000ML POUR BTL (IV SOLUTION) ×2 IMPLANT
PACK BASIN DAY SURGERY FS (CUSTOM PROCEDURE TRAY) ×2 IMPLANT
PENCIL SMOKE EVACUATOR (MISCELLANEOUS) ×2 IMPLANT
SHEET MEDIUM DRAPE 40X70 STRL (DRAPES) ×2 IMPLANT
SPONGE SURGIFOAM ABS GEL 12-7 (HEMOSTASIS) IMPLANT
SUT CHROMIC 4 0 P 3 18 (SUTURE) IMPLANT
SUT PLAIN 5 0 P 3 18 (SUTURE) IMPLANT
SUT VICRYL 4-0 PS2 18IN ABS (SUTURE) ×2 IMPLANT
SWABSTICK POVIDONE IODINE SNGL (MISCELLANEOUS) IMPLANT
SYR CONTROL 10ML LL (SYRINGE) ×2 IMPLANT
TOWEL GREEN STERILE FF (TOWEL DISPOSABLE) ×2 IMPLANT
TRAY DSU PREP LF (CUSTOM PROCEDURE TRAY) IMPLANT
TUBE CONNECTING 20X1/4 (TUBING) ×2 IMPLANT

## 2019-09-04 NOTE — Op Note (Signed)
DATE OF PROCEDURE: 09/04/2019  OPERATIVE REPORT   SURGEON: [name], MD  PREOPERATIVE DIAGNOSIS: Right ear canal mass.  POSTOPERATIVE DIAGNOSIS:Right ear canal mass.  PROCEDURES PERFORMED: Excision of right ear canal mass.  ANESTHESIA: General laryngeal mask anesthesia.  COMPLICATIONS: None.  ESTIMATED BLOOD LOSS: Minimal.  INDICATION FOR PROCEDURE:  [name] [name] is a 59 y.o. female with a soft tissue mass in the right ear canal. It has resulted in frequent cerumen impaction in the right ear canal. Based on the above findings, the decision was made for the patient to undergo the excision procedure. The risks, benefits, alternatives, and details of the procedures were discussed with the mother. Questions were invited and answered. Informed consent was obtained.  DESCRIPTION OF PROCEDURE: The patient was taken to the operating room and placed supine on the operating table. General laryngeal mask anesthesia was induced by the anesthesiologist.  Examination of the right ear canal revealed a fungating soft tissue mass.  1% lidocaine with 1-100,000 epinephrine was infiltrated around the soft tissue mass.  An elliptical incision was made around the mass using a #15 blade.  The entire mass was excised and sent to the pathology department for permanent histologic identification.  Hemostasis was achieved with Bovie electrocautery device.  The ear canal was packed with Xeroform dressing.  The care of the patient was turned over to the anesthesiologist. The patient was awakened from anesthesia without difficulty. She was extubated and transferred to the recovery room in good condition.  OPERATIVE FINDINGS:A right ear canal soft tissue mass was noted and removed.  SPECIMEN:Right ear canal mass.  FOLLOWUP CARE: The patient will be discharged home once she is awake and alert. She will follow up in my office in 1 week.

## 2019-09-04 NOTE — Anesthesia Procedure Notes (Signed)
Procedure Name: LMA Insertion Date/Time: 09/04/2019 11:26 AM Performed by: Lieutenant Diego, CRNA Pre-anesthesia Checklist: Patient identified, Emergency Drugs available, Suction available and Patient being monitored Patient Re-evaluated:Patient Re-evaluated prior to induction Oxygen Delivery Method: Circle system utilized Preoxygenation: Pre-oxygenation with 100% oxygen Induction Type: IV induction Ventilation: Mask ventilation without difficulty LMA: LMA inserted LMA Size: 4.0 Number of attempts: 1 Placement Confirmation: positive ETCO2 and breath sounds checked- equal and bilateral Tube secured with: Tape Dental Injury: Teeth and Oropharynx as per pre-operative assessment

## 2019-09-04 NOTE — H&P (Signed)
Cc: Right ear canal mass  HPI: The patient is a 59 year old female who returns today for follow up evaluation. The patient was last seen in May 2020 for clogging sensation in her ear. At her last visit, she was noted to have a right ear cerumen impaction. After the disimpaction procedure, the patient was noted to have a small cyst at the entrance to the right ear canal.  Her right tympanic membrane and middle ear space were normal.  The patient's left ear canal, tympanic membrane and middle ear space were all normal. The patient returns today noting clogging in her right ear. No otalgia, otorrhea, or vertigo is noted. The patient also feels like she has swollen lymph nodes along the right side of her neck. She has noted them since November. The patient feels like the lymph nodes are getting larger. No throat pain or dysphagia is noted. The patient has a history of Chiari malformation, status post surgical treatment. No other ENT, GI, or respiratory issue noted since the last visit.    Objective General: Communicates without difficulty, well nourished, no acute distress. Head: Normocephalic, no evidence injury, no tenderness, facial buttresses intact without stepoff. Face/sinus: No tenderness to palpation and percussion. Facial movement is normal and symmetric. Eyes: PERRL, EOMI. No scleral icterus, conjunctivae clear. Neuro: CN II exam reveals vision grossly intact. No nystagmus at any point of gaze. Auricles: Intact without lesions. Right ear cerumen impaction. After the disimpaction procedure, the patient is noted to have a small verrucous lesion at the entrance to the right ear canal. Her right tympanic membrane and middle ear space are normal. The patient's left ear canal, tympanic membrane and middle ear space are all normal. Nose: External evaluation reveals normal support and skin without lesions. Dorsum is intact. Anterior rhinoscopy reveals pink mucosa over anterior aspect of inferior turbinates and  intact septum. No purulence noted. Oral:  Oral cavity and oropharynx are intact, symmetric, without erythema or edema. Mucosa is moist without lesions. Neck: Full range of motion without pain. Multiple small lymph nodes. No masses palpable. Thyroid bed within normal limits to palpation. Parotid glands and submandibular glands equal bilaterally without mass. Trachea is midline. Neuro:  CN 2-12 grossly intact. Gait normal.   Assessment 1. Right ear cerumen impaction. After the disimpaction procedure, the patient is noted to have a small verrucous lesion at the entrance to the right ear canal. This may be the cause of her recurrent cerumen impaction. Her right tympanic membrane and middle ear space are normal.  2. The patient's left ear canal, tympanic membrane and middle ear space are all normal.  3. Shotty right sided lymph nodes.   Plan  1. Otomicroscopy with right ear cerumen removal.  2. The physical exam findings are reviewed with the patient.  3. Treatment options for her right ear canal lesion include observation versus excision. The risks, benefits, alternatives, and details of the procedure are reviewed with the patient. Questions are invited and answered. 4. The patient would like to proceed with excision of the lesion. The procedure will be scheduled in accordance with the patient's schedule.

## 2019-09-04 NOTE — Discharge Instructions (Addendum)
The patient may resume all her previous activities and diet.  She will follow-up in my office in 1 week.    Post Anesthesia Home Care Instructions  Activity: Get plenty of rest for the remainder of the day. A responsible individual must stay with you for 24 hours following the procedure.  For the next 24 hours, DO NOT: -Drive a car -Operate machinery -Drink alcoholic beverages -Take any medication unless instructed by your physician -Make any legal decisions or sign important papers.  Meals: Start with liquid foods such as gelatin or soup. Progress to regular foods as tolerated. Avoid greasy, spicy, heavy foods. If nausea and/or vomiting occur, drink only clear liquids until the nausea and/or vomiting subsides. Call your physician if vomiting continues.  Special Instructions/Symptoms: Your throat may feel dry or sore from the anesthesia or the breathing tube placed in your throat during surgery. If this causes discomfort, gargle with warm salt water. The discomfort should disappear within 24 hours.  If you had a scopolamine patch placed behind your ear for the management of post- operative nausea and/or vomiting:  1. The medication in the patch is effective for 72 hours, after which it should be removed.  Wrap patch in a tissue and discard in the trash. Wash hands thoroughly with soap and water. 2. You may remove the patch earlier than 72 hours if you experience unpleasant side effects which may include dry mouth, dizziness or visual disturbances. 3. Avoid touching the patch. Wash your hands with soap and water after contact with the patch.     

## 2019-09-04 NOTE — Transfer of Care (Signed)
Immediate Anesthesia Transfer of Care Note  Patient: [name] [name]  Procedure(s) Performed: EXCISION EAR MASS (Right )  Patient Location: PACU  Anesthesia Type:General  Level of Consciousness: awake  Airway & Oxygen Therapy: Patient Spontanous Breathing and Patient connected to face mask oxygen  Post-op Assessment: Report given to RN and Post -op Vital signs reviewed and stable  Post vital signs: Reviewed and stable  Last Vitals:  Vitals Value Taken Time  BP 121/74 09/04/19 1148  Temp    Pulse 69 09/04/19 1149  Resp 15 09/04/19 1149  SpO2 100 % 09/04/19 1149  Vitals shown include unvalidated device data.  Last Pain:  Vitals:   09/04/19 0958  PainSc: 0-No pain      Patients Stated Pain Goal: 3 ([id number])  Complications: No apparent anesthesia complications

## 2019-09-04 NOTE — Anesthesia Postprocedure Evaluation (Signed)
Anesthesia Post Note  Patient: Danaysha Wais  Procedure(s) Performed: EXCISION EAR MASS (Right Ear)     Patient location during evaluation: PACU Anesthesia Type: General Level of consciousness: awake and alert Pain management: pain level controlled Vital Signs Assessment: post-procedure vital signs reviewed and stable Respiratory status: spontaneous breathing, nonlabored ventilation and respiratory function stable Cardiovascular status: blood pressure returned to baseline and stable Postop Assessment: no apparent nausea or vomiting Anesthetic complications: no    Last Vitals:  Vitals:   09/04/19 1200 09/04/19 1228  BP: 119/72 (!) 111/53  Pulse: 65 (!) 52  Resp: 14 18  Temp:  36.5 C  SpO2: 97% 100%    Last Pain:  Vitals:   09/04/19 1228  TempSrc: Oral  PainSc: 0-No pain                 Lynda Rainwater

## 2019-09-05 LAB — SURGICAL PATHOLOGY

## 2019-09-05 NOTE — Addendum Note (Signed)
Addendum  created 09/05/19 0919 by Tawni Millers, CRNA   Charge Capture section accepted

## 2019-09-11 DIAGNOSIS — S0502XA Injury of conjunctiva and corneal abrasion without foreign body, left eye, initial encounter: Secondary | ICD-10-CM | POA: Diagnosis not present

## 2019-09-11 DIAGNOSIS — H1132 Conjunctival hemorrhage, left eye: Secondary | ICD-10-CM | POA: Diagnosis not present

## 2019-09-12 ENCOUNTER — Telehealth: Payer: Self-pay | Admitting: Family Medicine

## 2019-09-12 NOTE — Telephone Encounter (Signed)
Fax from [facility] regarding pt Alprazolam 1 mg tablets. Fax states that pt member plan has lower tier alternatives. [facility] wanting to know if any of the lower tier alternatives would be effective for member and would any of the lower tier alternatives likely have adverse effects. Lower tier alternatives include: Lorazepam tablet, lorazepam concentrate, lorazepam intensol concentrate, buspirone hcl tablet, citalopram hydrobromide tablet, mirtazapine tablet, paroxetine hcl tablet, sertraline hct tablet, venlafaxine hcl tablet, venlafaxine er capsule, venlafaxine er tablet and prochlorperazine maleate tablet.   Fax is in provider office for review also. Please advise. Thank you

## 2019-09-13 ENCOUNTER — Telehealth: Payer: Self-pay | Admitting: Family Medicine

## 2019-09-13 MED ORDER — LORAZEPAM 2 MG PO TABS: ORAL_TABLET | ORAL | 0 refills | Status: DC

## 2019-09-13 NOTE — Telephone Encounter (Signed)
Stop xanax 1 mg bid, replace with lorazepam 2 mg, numb 60, one up to bid prn anxiety

## 2019-09-13 NOTE — Addendum Note (Signed)
Addended by: Vicente Males on: 09/13/2019 10:31 AM   Modules accepted: Orders

## 2019-09-13 NOTE — Telephone Encounter (Signed)
Sonia Baller, representative from [facility] [employer] department contacted office regarding pt Alprazolam 1 mg tablet. Pt has requested a tier reduction. Questions asked regarding if pt had tried and failed any other medications and if a lower tier would cause adverse reactions for patient. Informed representative that patient has anxiety and has been on Alprazolam for a while and is stable on current dose. Representative states that this information will be passed along to clinical staff and a determination fax will be faxed to office and to patient once a decision is made.

## 2019-09-13 NOTE — Addendum Note (Signed)
Addended by: Vicente Males on: 09/13/2019 10:30 AM   Modules accepted: Orders

## 2019-09-13 NOTE — Telephone Encounter (Signed)
Xanax d/c; lorazepam 2 mg sent to pharmacy. Pt is aware and verbalized understanding

## 2019-09-14 ENCOUNTER — Other Ambulatory Visit: Payer: Self-pay | Admitting: Family Medicine

## 2019-09-14 NOTE — Telephone Encounter (Signed)
Last med check up 10/18/18

## 2019-09-20 ENCOUNTER — Telehealth: Payer: Self-pay | Admitting: Family Medicine

## 2019-09-20 MED ORDER — ATENOLOL 50 MG PO TABS: 50.0000 mg | ORAL_TABLET | Freq: Two times a day (BID) | ORAL | 5 refills | Status: DC

## 2019-09-20 NOTE — Telephone Encounter (Signed)
Pt calling to check on this refill

## 2019-09-20 NOTE — Telephone Encounter (Signed)
Med check up 10/18/18 but note does mention her bp was good on recheck at her visit 07/24/19 for acute issue

## 2019-09-20 NOTE — Telephone Encounter (Signed)
Patient states she has been trying to get medication refill on atenolol 50 mg called into [facility]. She states completely out.

## 2019-09-20 NOTE — Telephone Encounter (Signed)
Six mo worth 

## 2019-09-28 ENCOUNTER — Ambulatory Visit: Payer: Medicare HMO | Attending: Internal Medicine

## 2019-09-28 DIAGNOSIS — Z23 Encounter for immunization: Secondary | ICD-10-CM

## 2019-09-28 NOTE — Progress Notes (Signed)
   Covid-19 Vaccination Clinic  Name:  Shakaira Villaverde    MRN: [id number] DOB: 03/18/1961  09/28/2019  Ms. Halderman was observed post Covid-19 immunization for 15 minutes without incident. She was provided with Vaccine Information Sheet and instruction to access the V-Safe system.   Ms. Benvenuti was instructed to call 911 with any severe reactions post vaccine: Marland Kitchen Difficulty breathing  . Swelling of face and throat  . A fast heartbeat  . A bad rash all over body  . Dizziness and weakness   Immunizations Administered    Name Date Dose VIS Date Route   Moderna COVID-19 Vaccine 09/28/2019 11:02 AM 0.5 mL 06/06/2019 Intramuscular   Manufacturer: Moderna   Lot: [id number]   [id number]

## 2019-10-06 ENCOUNTER — Other Ambulatory Visit: Payer: Self-pay | Admitting: *Deleted

## 2019-10-09 ENCOUNTER — Other Ambulatory Visit: Payer: Self-pay | Admitting: Family Medicine

## 2019-10-09 MED ORDER — ATORVASTATIN CALCIUM 20 MG PO TABS: 20.0000 mg | ORAL_TABLET | Freq: Every day | ORAL | 0 refills | Status: DC

## 2019-10-10 DIAGNOSIS — M5416 Radiculopathy, lumbar region: Secondary | ICD-10-CM | POA: Diagnosis not present

## 2019-10-10 DIAGNOSIS — M13852 Other specified arthritis, left hip: Secondary | ICD-10-CM | POA: Diagnosis not present

## 2019-10-10 DIAGNOSIS — M25562 Pain in left knee: Secondary | ICD-10-CM | POA: Diagnosis not present

## 2019-10-11 ENCOUNTER — Other Ambulatory Visit: Payer: Self-pay | Admitting: Family Medicine

## 2019-10-13 DIAGNOSIS — M545 Low back pain: Secondary | ICD-10-CM | POA: Diagnosis not present

## 2019-10-16 ENCOUNTER — Other Ambulatory Visit: Payer: Self-pay | Admitting: Family Medicine

## 2019-10-25 ENCOUNTER — Telehealth: Payer: Self-pay | Admitting: Family Medicine

## 2019-10-25 MED ORDER — METFORMIN HCL ER 500 MG PO TB24: 500.0000 mg | ORAL_TABLET | Freq: Every day | ORAL | 0 refills | Status: DC

## 2019-10-25 NOTE — Telephone Encounter (Signed)
Medication sent to pharmacy and pt is aware 

## 2019-10-25 NOTE — Telephone Encounter (Signed)
Pt last seen 07/24/2019 for breast pain. Please advise. Thank you

## 2019-10-25 NOTE — Telephone Encounter (Signed)
Patient called to check on Metformin rx, she said the pharmacy said they have been sending a refill request for a week.  I told her we had no request in the system.  She made an appt for 11/07/19 but needs a refill prior to that.  [facility]

## 2019-10-25 NOTE — Telephone Encounter (Signed)
Three mo worth

## 2019-10-26 ENCOUNTER — Ambulatory Visit: Payer: Medicare HMO

## 2019-11-01 ENCOUNTER — Other Ambulatory Visit: Payer: Self-pay | Admitting: Family Medicine

## 2019-11-02 ENCOUNTER — Ambulatory Visit: Payer: Medicare HMO

## 2019-11-07 ENCOUNTER — Ambulatory Visit (INDEPENDENT_AMBULATORY_CARE_PROVIDER_SITE_OTHER): Payer: Medicare HMO | Admitting: Family Medicine

## 2019-11-07 ENCOUNTER — Encounter: Payer: Self-pay | Admitting: Family Medicine

## 2019-11-07 ENCOUNTER — Other Ambulatory Visit: Payer: Self-pay

## 2019-11-07 VITALS — BP 118/78 | Temp 97.2°F | Ht 61.0 in | Wt 172.0 lb

## 2019-11-07 DIAGNOSIS — E785 Hyperlipidemia, unspecified: Secondary | ICD-10-CM | POA: Diagnosis not present

## 2019-11-07 DIAGNOSIS — F329 Major depressive disorder, single episode, unspecified: Secondary | ICD-10-CM | POA: Diagnosis not present

## 2019-11-07 DIAGNOSIS — R69 Illness, unspecified: Secondary | ICD-10-CM | POA: Diagnosis not present

## 2019-11-07 DIAGNOSIS — I10 Essential (primary) hypertension: Secondary | ICD-10-CM | POA: Diagnosis not present

## 2019-11-07 DIAGNOSIS — E1169 Type 2 diabetes mellitus with other specified complication: Secondary | ICD-10-CM | POA: Diagnosis not present

## 2019-11-07 DIAGNOSIS — F32A Depression, unspecified: Secondary | ICD-10-CM

## 2019-11-07 DIAGNOSIS — E119 Type 2 diabetes mellitus without complications: Secondary | ICD-10-CM

## 2019-11-07 DIAGNOSIS — F419 Anxiety disorder, unspecified: Secondary | ICD-10-CM | POA: Diagnosis not present

## 2019-11-07 LAB — POCT GLYCOSYLATED HEMOGLOBIN (HGB A1C): Hemoglobin A1C: 6.8 % — AB (ref 4.0–5.6)

## 2019-11-07 MED ORDER — ATENOLOL 50 MG PO TABS: 50.0000 mg | ORAL_TABLET | Freq: Two times a day (BID) | ORAL | 5 refills | Status: DC

## 2019-11-07 MED ORDER — ATORVASTATIN CALCIUM 20 MG PO TABS: 20.0000 mg | ORAL_TABLET | Freq: Every day | ORAL | 1 refills | Status: DC

## 2019-11-07 MED ORDER — ESCITALOPRAM OXALATE 20 MG PO TABS: 20.0000 mg | ORAL_TABLET | Freq: Every day | ORAL | 1 refills | Status: DC

## 2019-11-07 MED ORDER — LORAZEPAM 2 MG PO TABS: ORAL_TABLET | ORAL | 5 refills | Status: DC

## 2019-11-07 NOTE — Progress Notes (Addendum)
   Subjective:    Patient ID: [name] [name], female    DOB: Jun 04, 1961, 59 y.o.   MRN: [id number]  Diabetes She presents for her follow-up diabetic visit. She has type 2 diabetes mellitus. Risk factors for coronary artery disease include diabetes mellitus, dyslipidemia and hypertension. Current diabetic treatment includes oral agent (monotherapy). She is compliant with treatment all of the time. Her weight is stable. She is following a diabetic diet.    Results for orders placed or performed in visit on 11/07/19  POCT glycosylated hemoglobin (Hb A1C)  Result Value Ref Range   Hemoglobin A1C 6.8 (A) 4.0 - 5.6 %   HbA1c POC (<> result, manual entry)     HbA1c, POC (prediabetic range)     HbA1c, POC (controlled diabetic range)     Patient claims compliance with diabetes medication. No obvious side effects. Reports no substantial low sugar spells. Most numbers are generally in good range when checked fasting. Generally does not miss a dose of medication. Watching diabetic diet closely  Blood pressure medicine and blood pressure levels reviewed today with patient. Compliant with blood pressure medicine. States does not miss a dose. No obvious side effects. Blood pressure generally good when checked elsewhere. Watching salt intake.   Patient continues to take lipid medication regularly. No obvious side effects from it. Generally does not miss a dose. Prior blood work results are reviewed with patient. Patient continues to work on fat intake in diet  Walking some, doing some extra   Does fpster care having challenges  With that in terms of stress     Review of Systems No headache, no major weight loss or weight gain, no chest pain no back pain abdominal pain no change in bowel habits complete ROS otherwise negative     Objective:   Physical Exam Alert and oriented, vitals reviewed and stable, NAD ENT-TM's and ext canals WNL bilat via otoscopic exam Soft palate, tonsils and post  pharynx WNL via oropharyngeal exam Neck-symmetric, no masses; thyroid nonpalpable and nontender Pulmonary-no tachypnea or accessory muscle use; Clear without wheezes via auscultation Card--no abnrml murmurs, rhythm reg and rate WNL Carotid pulses symmetric, without bruits        Assessment & Plan:  Impression #1 type 2 diabetes.  Good control.  Compliance discussed diet discussed  2.  Hypertension.  Good control discussed to maintain same meds  3.  Hyperlipidemia status uncertain appropriate blood work  4.  Depression with element of anxiety.  Chronic and ongoing patient to maintain meds  Follow-up in 3 months for wellness exam

## 2019-11-08 ENCOUNTER — Other Ambulatory Visit: Payer: Self-pay | Admitting: *Deleted

## 2019-11-08 LAB — HEPATIC FUNCTION PANEL
ALT: 18 IU/L (ref 0–32)
AST: 17 IU/L (ref 0–40)
Albumin: 4.2 g/dL (ref 3.8–4.9)
Alkaline Phosphatase: 77 IU/L (ref 39–117)
Bilirubin Total: 0.3 mg/dL (ref 0.0–1.2)
Bilirubin, Direct: 0.1 mg/dL (ref 0.00–0.40)
Total Protein: 6.7 g/dL (ref 6.0–8.5)

## 2019-11-08 LAB — BASIC METABOLIC PANEL
BUN/Creatinine Ratio: 9 (ref 9–23)
BUN: 8 mg/dL (ref 6–24)
CO2: 25 mmol/L (ref 20–29)
Calcium: 9.3 mg/dL (ref 8.7–10.2)
Chloride: 106 mmol/L (ref 96–106)
Creatinine, Ser: 0.94 mg/dL (ref 0.57–1.00)
GFR calc Af Amer: 77 mL/min/{1.73_m2} (ref >59)
GFR calc non Af Amer: 67 mL/min/{1.73_m2} (ref >59)
Glucose: 88 mg/dL (ref 65–99)
Potassium: 4.1 mmol/L (ref 3.5–5.2)
Sodium: 145 mmol/L — ABNORMAL HIGH (ref 134–144)

## 2019-11-08 LAB — LIPID PANEL
Chol/HDL Ratio: 3.1 ratio (ref 0.0–4.4)
Cholesterol, Total: 195 mg/dL (ref 100–199)
HDL: 62 mg/dL (ref >39)
LDL Chol Calc (NIH): 114 mg/dL — ABNORMAL HIGH (ref 0–99)
Triglycerides: 106 mg/dL (ref 0–149)
VLDL Cholesterol Cal: 19 mg/dL (ref 5–40)

## 2019-11-08 LAB — MICROALBUMIN / CREATININE URINE RATIO
Creatinine, Urine: 155.7 mg/dL
Microalb/Creat Ratio: 6 mg/g creat (ref 0–29)
Microalbumin, Urine: 9.9 ug/mL

## 2019-11-08 MED ORDER — PANTOPRAZOLE SODIUM 40 MG PO TBEC: 40.0000 mg | DELAYED_RELEASE_TABLET | Freq: Every day | ORAL | 1 refills | Status: DC

## 2019-11-12 ENCOUNTER — Encounter: Payer: Self-pay | Admitting: Family Medicine

## 2019-11-12 ENCOUNTER — Ambulatory Visit: Payer: Medicare HMO

## 2019-11-21 DIAGNOSIS — M222X1 Patellofemoral disorders, right knee: Secondary | ICD-10-CM | POA: Insufficient documentation

## 2019-11-21 DIAGNOSIS — M222X2 Patellofemoral disorders, left knee: Secondary | ICD-10-CM | POA: Diagnosis not present

## 2019-11-23 DIAGNOSIS — J3081 Allergic rhinitis due to animal (cat) (dog) hair and dander: Secondary | ICD-10-CM | POA: Diagnosis not present

## 2019-11-23 DIAGNOSIS — J3089 Other allergic rhinitis: Secondary | ICD-10-CM | POA: Diagnosis not present

## 2019-11-23 DIAGNOSIS — J301 Allergic rhinitis due to pollen: Secondary | ICD-10-CM | POA: Diagnosis not present

## 2019-11-23 DIAGNOSIS — T783XXA Angioneurotic edema, initial encounter: Secondary | ICD-10-CM | POA: Diagnosis not present

## 2019-11-23 DIAGNOSIS — T783XXD Angioneurotic edema, subsequent encounter: Secondary | ICD-10-CM | POA: Diagnosis not present

## 2019-11-27 DIAGNOSIS — M222X1 Patellofemoral disorders, right knee: Secondary | ICD-10-CM | POA: Diagnosis not present

## 2019-11-27 DIAGNOSIS — M25561 Pain in right knee: Secondary | ICD-10-CM | POA: Diagnosis not present

## 2019-11-27 DIAGNOSIS — M25562 Pain in left knee: Secondary | ICD-10-CM | POA: Diagnosis not present

## 2019-11-27 DIAGNOSIS — M222X2 Patellofemoral disorders, left knee: Secondary | ICD-10-CM | POA: Diagnosis not present

## 2019-12-18 ENCOUNTER — Ambulatory Visit: Payer: Medicare HMO | Admitting: Obstetrics and Gynecology

## 2019-12-18 ENCOUNTER — Encounter: Payer: Self-pay | Admitting: Obstetrics and Gynecology

## 2019-12-18 VITALS — BP 125/77 | HR 53 | Ht 61.0 in | Wt 173.0 lb

## 2019-12-18 DIAGNOSIS — N824 Other female intestinal-genital tract fistulae: Secondary | ICD-10-CM | POA: Diagnosis not present

## 2019-12-18 NOTE — Progress Notes (Signed)
Patient ID: [name] [name], female   DOB: [birth year], 59 y.o.   MRN: [id number]    [facility] Visit  _0 @            Patient name: [name] [name] MRN [id number]  Date of birth: Sep 19, 1960  CC & HPI:  [name] [name] is a 59 y.o. 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 For years, she believes she has had feces in her vagina, but it has worsened overtime. She reports that when she tries to hold in a bowel movement, it comes out anyways and that some of it comes out of her vagina. She notices it when she wipes and she wipes from front to back. This happens about once or twice per month.  The patient also frequently feels a "bubbling" sensation when she urinates. She reports stomach cramps and headaches. On 06/22/2018, she had a CT abdomen/pelvis w contrast ordered by Dr Glo Herring which revealed "no radiographic evidence of rectovaginal fistula." She had a hysterectomy but these symptoms began a year after. She has had four children vaginally. The patient denies, fever, chills or any other symptoms or complaints at this time.   ROS:  ROS + vaginitis + stool in her vagina + stomach cramps + headaches - fever - chills [name] systems are negative except as noted in the HPI and PMH.   Pertinent History Reviewed:   Reviewed: Medical         Past Medical History:  Diagnosis Date  . Allergic rhinitis   . Anemia, unspecified 05/30/2013  . Anxiety   . Breast cancer ([place]) 03/31/13   left  . Cancer ([place])   . Chest pain 2009   Consultation-[facility], negative chest CT; nl echo in 2005; h/o palpitations  . Chiari malformation type I ([place])    s/p suboccipital craniectomy, C1 laminectomy, superior C2 laminectomy, duraplasty 05/10/14  . Colitis 2010   not IBD  . Degenerative joint disease    + degenerative joint disease of the lumbosacral spine  . Depression   . Diabetes ([place])   . Diabetes ([place])   . Dysrhythmia    palpations  . GERD (gastroesophageal  reflux disease)    "a little"  . Headache   . Heart murmur    "small"  . Herpes simplex type II infection   . Hypercholesteremia    "slightly high"  . Hypertension    does not have high blood pressure  . 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 Palpitations   . Personal history of chemotherapy   . Personal history of radiation therapy   . Wears glasses                               Surgical Hx:    Past Surgical History:  Procedure Laterality Date  . ABDOMINAL HYSTERECTOMY  03/13/2007   TAH ?BSO--Dr Levin Bacon, [place]  . BREAST EXCISIONAL BIOPSY  04/2003   Left; benign disease  . BREAST LUMPECTOMY Left 09/2013  . BREAST LUMPECTOMY WITH NEEDLE LOCALIZATION AND AXILLARY LYMPH NODE DISSECTION Left 09/12/2013   Procedure: BREAST LUMPECTOMY WITH NEEDLE LOCALIZATION AND AXILLARY LYMPH NODE DISSECTION;  Surgeon: [name], MD;  Location: [facility];  Service: General;  Laterality: Left;  and axilla  . BREAST SURGERY    . COLONOSCOPY  10/2010   proctitis; melanosis coli  . COLONOSCOPY WITH PROPOFOL N/A 01/04/2018   Dr. [name]: hemorrhoids, follow up colonoscopy in 10  years  . EAR CYST EXCISION Right 09/04/2019   Procedure: EXCISION EAR MASS;  Surgeon: [name], MD;  Location: [facility];  Service: ENT;  Laterality: Right;  . EXCISION/RELEASE BURSA HIP Left 03/17/2019   Procedure: Left hip open bursectomy;  Surgeon: Nicholes Stairs, MD;  Location: [facility];  Service: Orthopedics;  Laterality: Left;  75 mins  . EYE SURGERY Left    "fix lazy eye"  . PORTACATH PLACEMENT Right 04/21/2013   Procedure: INSERTION PORT-A-CATH;  Surgeon: [name], MD;  Location: [facility];  Service: General;  Laterality: Right;  . RIGHT OOPHORECTOMY  03/13/2007  . SUBOCCIPITAL CRANIECTOMY CERVICAL LAMINECTOMY N/A 05/10/2014   Procedure: SUBOCCIPITAL CRANIECTOMY CERVICAL LAMINECTOMY/DURAPLASTY;  Surgeon: [name], MD;  Location: [facility];  Service: Neurosurgery;  Laterality: N/A;   suboccipital craniectomy with cervical laminectomy and duraplasty  . TUBAL LIGATION     Medications: Reviewed & Updated - see associated section                       Current Outpatient Medications:  .  acyclovir (ZOVIRAX) 400 MG tablet, TAKE 1 TABLET BY MOUTH TWICE A DAY, Disp: 180 tablet, Rfl: 0 .  atenolol (TENORMIN) 50 MG tablet, Take 1 tablet (50 mg total) by mouth 2 (two) times daily., Disp: 180 tablet, Rfl: 5 .  atorvastatin (LIPITOR) 20 MG tablet, Take 1 tablet (20 mg total) by mouth daily at 6 PM., Disp: 90 tablet, Rfl: 1 .  blood glucose meter kit and supplies, Dispense based on patient and insurance preference. Use up to test glucose once daily. For ICD -10 E11.9., Disp: 1 each, Rfl: 0 .  cholecalciferol (VITAMIN D3) 25 MCG (1000 UT) tablet, Take 1,000 Units by mouth daily., Disp: , Rfl:  .  escitalopram (LEXAPRO) 20 MG tablet, Take 1 tablet (20 mg total) by mouth daily., Disp: 90 tablet, Rfl: 1 .  Lancets (ONETOUCH DELICA PLUS [id number]) MISC, USE TO TEST BLOOD GLUCOSE DAILY, Disp: 100 each, Rfl: 5 .  levocetirizine (XYZAL) 5 MG tablet, Take 5 mg by mouth every evening., Disp: , Rfl:  .  LORazepam (ATIVAN) 2 MG tablet, Take one tablet po up to BID prn anxiety, Disp: 60 tablet, Rfl: 5 .  Melatonin 3 MG CAPS, Take 3 mg by mouth at bedtime., Disp: , Rfl:  .  metFORMIN (GLUCOPHAGE XR) 500 MG 24 hr tablet, Take 1 tablet (500 mg total) by mouth daily with breakfast., Disp: 90 tablet, Rfl: 0 .  ONE TOUCH ULTRA TEST test strip, USE TO TEST BLOOD GLUCOSE DAILY, Disp: 50 each, Rfl: 5 .  pantoprazole (PROTONIX) 40 MG tablet, Take 1 tablet (40 mg total) by mouth daily before breakfast., Disp: 90 tablet, Rfl: 1 .  clotrimazole-betamethasone (LOTRISONE) cream, Apply 1 application topically 2 (two) times daily. (Patient not taking: Reported on 12/18/2019), Disp: 30 g, Rfl: 0 .  EPINEPHrine 0.3 mg/0.3 mL IJ SOAJ injection, Inject 0.3 mg into the muscle as needed for anaphylaxis.  (Patient not taking:  Reported on 12/18/2019), Disp: , Rfl: 1   Social History: Reviewed -  reports that she quit smoking about 37 years ago. Her smoking use included cigarettes. She has a 7.50 pack-year smoking history. She has never used smokeless tobacco.  Objective Findings:  Vitals: Blood pressure 125/77, pulse (!) 53, height _0  (1.549 m), weight 173 lb (78.5 kg), last menstrual period 12/04/2008.  PHYSICAL EXAMINATION General appearance - alert, well appearing, and in no  distress, oriented to person, place, and time and overweight Mental status - alert, oriented to person, place, and time, normal mood, behavior, speech, dress, motor activity, and thought processes, affect appropriate to mood  PELVIC Vagina - Milky white vaginal secretions, without malodor, or discoloration,  Uterus - surgically absent, speculum exam and careful palpation does now identify any areas of firmness, discoloration, and having her cough with speculum removed does NOT show any identifiable gas passage or bubbling. Cuff itself is smooth without nodularity ( as one might  expect with RV fistulae.).  Assessment & Plan:   A:  1. Normal GYN exams without identifiable RV fistula 2. Intermittent vaginal soilage with fecal material 3. Patient has intermittent sensation of air passage through vagina  P:  1.  Will refer to Dr. Zigmund Daniel in [facility] for further assessment. 2. I'm uncertain how to explain the sx, unless there's a fistula I have missed several times on exam.  By signing my name below, I, De Burrs, attest that this documentation has been prepared under the direction and in the presence of Jonnie Kind, MD. Electronically Signed: De Burrs, Medical Scribe. 12/18/19. 4:10 PM.  I personally performed the services described in this documentation, which was SCRIBED in my presence. The recorded information has been reviewed and considered accurate. It has been edited as necessary during review. Jonnie Kind,  MD

## 2019-12-27 DIAGNOSIS — E119 Type 2 diabetes mellitus without complications: Secondary | ICD-10-CM | POA: Diagnosis not present

## 2019-12-27 LAB — HM DIABETES EYE EXAM

## 2020-01-10 ENCOUNTER — Other Ambulatory Visit: Payer: Self-pay | Admitting: Family Medicine

## 2020-01-10 NOTE — Telephone Encounter (Signed)
11/07/19 last med check up

## 2020-01-17 ENCOUNTER — Telehealth: Payer: Self-pay | Admitting: Family Medicine

## 2020-01-17 NOTE — Telephone Encounter (Signed)
[facility] pharmacy requesting refill on Metformin 500 mg. Take one tablet po every day with breakfast. Pt last seen 11/07/19 for DM. Please advise. Thank you

## 2020-01-19 MED ORDER — METFORMIN HCL ER 500 MG PO TB24: 500.0000 mg | ORAL_TABLET | Freq: Every day | ORAL | 0 refills | Status: DC

## 2020-01-19 NOTE — Addendum Note (Signed)
Addended by: Erven Colla on: 01/19/2020 01:03 PM   Modules accepted: Orders

## 2020-01-19 NOTE — Telephone Encounter (Signed)
Patient notified

## 2020-01-22 ENCOUNTER — Other Ambulatory Visit: Payer: Self-pay

## 2020-01-22 DIAGNOSIS — C50412 Malignant neoplasm of upper-outer quadrant of left female breast: Secondary | ICD-10-CM

## 2020-01-22 DIAGNOSIS — Z170 Estrogen receptor positive status [ER+]: Secondary | ICD-10-CM

## 2020-01-22 MED ORDER — CLOTRIMAZOLE-BETAMETHASONE 1-0.05 % EX CREA: 1.0000 "application " | TOPICAL_CREAM | Freq: Two times a day (BID) | CUTANEOUS | 0 refills | Status: DC

## 2020-01-22 NOTE — Telephone Encounter (Signed)
Fax received from [facility] requesting refill for Lotrisone Cream. Per 12/18/19 visit, pt ws no longer using cream. This nurse called pt to ask if she is still using this and if she does in fact need a refill. Pt states she does need refill. Informed pt that refill has been sent in for her. Verbalized thanks and understanding.

## 2020-01-23 ENCOUNTER — Encounter: Payer: Self-pay | Admitting: Adult Health

## 2020-01-23 ENCOUNTER — Inpatient Hospital Stay: Payer: Medicare HMO | Attending: Adult Health | Admitting: Adult Health

## 2020-01-23 ENCOUNTER — Other Ambulatory Visit: Payer: Self-pay

## 2020-01-23 VITALS — BP 140/70 | HR 94 | Temp 98.9°F | Resp 18 | Ht 61.0 in | Wt 175.4 lb

## 2020-01-23 DIAGNOSIS — Z923 Personal history of irradiation: Secondary | ICD-10-CM | POA: Insufficient documentation

## 2020-01-23 DIAGNOSIS — Z8049 Family history of malignant neoplasm of other genital organs: Secondary | ICD-10-CM | POA: Insufficient documentation

## 2020-01-23 DIAGNOSIS — Z801 Family history of malignant neoplasm of trachea, bronchus and lung: Secondary | ICD-10-CM | POA: Insufficient documentation

## 2020-01-23 DIAGNOSIS — Z87891 Personal history of nicotine dependence: Secondary | ICD-10-CM | POA: Insufficient documentation

## 2020-01-23 DIAGNOSIS — Z170 Estrogen receptor positive status [ER+]: Secondary | ICD-10-CM | POA: Diagnosis not present

## 2020-01-23 DIAGNOSIS — Z803 Family history of malignant neoplasm of breast: Secondary | ICD-10-CM | POA: Insufficient documentation

## 2020-01-23 DIAGNOSIS — Z806 Family history of leukemia: Secondary | ICD-10-CM | POA: Diagnosis not present

## 2020-01-23 DIAGNOSIS — K219 Gastro-esophageal reflux disease without esophagitis: Secondary | ICD-10-CM | POA: Insufficient documentation

## 2020-01-23 DIAGNOSIS — Z853 Personal history of malignant neoplasm of breast: Secondary | ICD-10-CM | POA: Diagnosis not present

## 2020-01-23 DIAGNOSIS — R69 Illness, unspecified: Secondary | ICD-10-CM | POA: Diagnosis not present

## 2020-01-23 DIAGNOSIS — Z90722 Acquired absence of ovaries, bilateral: Secondary | ICD-10-CM | POA: Insufficient documentation

## 2020-01-23 DIAGNOSIS — Z79899 Other long term (current) drug therapy: Secondary | ICD-10-CM | POA: Insufficient documentation

## 2020-01-23 DIAGNOSIS — I10 Essential (primary) hypertension: Secondary | ICD-10-CM | POA: Insufficient documentation

## 2020-01-23 DIAGNOSIS — F419 Anxiety disorder, unspecified: Secondary | ICD-10-CM | POA: Insufficient documentation

## 2020-01-23 DIAGNOSIS — R221 Localized swelling, mass and lump, neck: Secondary | ICD-10-CM | POA: Insufficient documentation

## 2020-01-23 DIAGNOSIS — Z8249 Family history of ischemic heart disease and other diseases of the circulatory system: Secondary | ICD-10-CM | POA: Diagnosis not present

## 2020-01-23 DIAGNOSIS — Z9221 Personal history of antineoplastic chemotherapy: Secondary | ICD-10-CM | POA: Diagnosis not present

## 2020-01-23 DIAGNOSIS — Z90710 Acquired absence of both cervix and uterus: Secondary | ICD-10-CM | POA: Diagnosis not present

## 2020-01-23 DIAGNOSIS — C50412 Malignant neoplasm of upper-outer quadrant of left female breast: Secondary | ICD-10-CM

## 2020-01-23 DIAGNOSIS — Z7984 Long term (current) use of oral hypoglycemic drugs: Secondary | ICD-10-CM | POA: Insufficient documentation

## 2020-01-23 DIAGNOSIS — Z8349 Family history of other endocrine, nutritional and metabolic diseases: Secondary | ICD-10-CM | POA: Insufficient documentation

## 2020-01-23 DIAGNOSIS — M8580 Other specified disorders of bone density and structure, unspecified site: Secondary | ICD-10-CM | POA: Insufficient documentation

## 2020-01-23 DIAGNOSIS — E7800 Pure hypercholesterolemia, unspecified: Secondary | ICD-10-CM | POA: Insufficient documentation

## 2020-01-23 DIAGNOSIS — Z807 Family history of other malignant neoplasms of lymphoid, hematopoietic and related tissues: Secondary | ICD-10-CM | POA: Diagnosis not present

## 2020-01-23 DIAGNOSIS — F329 Major depressive disorder, single episode, unspecified: Secondary | ICD-10-CM | POA: Diagnosis not present

## 2020-01-23 DIAGNOSIS — Z8042 Family history of malignant neoplasm of prostate: Secondary | ICD-10-CM | POA: Diagnosis not present

## 2020-01-23 DIAGNOSIS — E119 Type 2 diabetes mellitus without complications: Secondary | ICD-10-CM | POA: Diagnosis not present

## 2020-01-23 DIAGNOSIS — Z833 Family history of diabetes mellitus: Secondary | ICD-10-CM | POA: Insufficient documentation

## 2020-01-23 NOTE — Progress Notes (Signed)
CLINIC:  Survivorship   REASON FOR VISIT:  Routine follow-up for history of breast cancer.   BRIEF ONCOLOGIC HISTORY:   59 y.o. [name] [name], [name] [name] woman   (1)  status post left breast upper outer quadrant biopsy 03/29/2013 for a clinical T2-T3 pN1, stage IIB-IIIA invasive ductal carcinoma, grade 2-3, estrogen receptor 39% positive (with moderate staining intensity), progesterone receptor negative, with an MIB-1 of 83%, and HER-2 amplified by CISH, with a ratio of 7.5 and 15 HER-2 copies per cell  (2) neoadjuvant chemotherapy, with trastuzumab/ pertuzumab/ carboplatin/ docetaxel repeated every 3 weeks x6, completed 08/21/2013. Pertuzumab  held after 4 cycles secondary to diarrhea. The patient received Neulasta on day 2 at [facility] per her request.   (3) Trastuzumab continued for total of one year (last dose 04/13/2014)). Most recent echo 06/04/2014 showed a well preserved ejection fraction.  (4)  status post left lumpectomy and axillary node dissection 09/12/2013, with a complete pathologic response (a total of 16 lymph nodes were examined,1 sentinel and 15 non-sentinel),  YpT0, ypN0.  (5) adjuvant radiation at [facility] completed 12/08/2013  (6) started anastrozole 01/16/2014             (a) bone density 04/09/2016 shows osteopenia with a T score of -1.7              (b) switched to tamoxifen as of OCT 2017 because of vaginal dryness issues                         (i) status post remote total abdominal hysterectomy with right salpingo-oophorectomy             (c) pelvic and transvaginal ultrasound found a normal left ovary, vaginal cuff appeared normal, with no area of suspicious attachment of the bowel to the cuff apex             (d) tamoxifen discontinued November 2019  (7)  genetics testing 06/22/2016 through the 21-gene Custom Cancer Panel through [employer], MD) found no deleterious mutations in ATM, BARD1, BRCA1, BRCA2, BRIP1, CDH1, CHEK2,  EPCAM, FANCC, HOXB13, MLH1, MSH2, MSH6, NBN, PALB2, PMS2, PTEN, RAD51C, RAD51D, TP53, and XRCC2.               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   INTERVAL HISTORY:  [name] [name] presents to the Survivorship Clinic today for routine follow-up for her history of breast cancer.  Overall, she reports feeling quite well.  She underwent CT chest and bone scan in 01/2019 that showed no evidence of metastatic disease.   She was having shortness of breath and bone pain at that time.    She underwent mammogram and ultrasound due to breast pain in 08/2019, and it was negative for malignancy and she had breast density B.  She was recommended repeat annual mammogram in 08/2020  She is seeing primary care regularly.  Dr. Wolfgang Phoenix retired, and so her new PCP listed is Dr. Lovena Le.  She sees Pearson Forster a Designer, jewellery, who will do her pelvic exams and pap smears.  She is up to date with colon cancer screening.  She notes that she is fatigued and has gained weight, and isn't exercising as much.  She is getting fruits and vegetables in daily.    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  REVIEW OF  SYSTEMS:  Review of Systems  Constitutional: Positive for fatigue. Negative for appetite change, chills, fever and unexpected weight change.  HENT:   Negative for hearing loss, lump/mass, sore throat and trouble swallowing.   Eyes: Negative for eye problems and icterus.  Respiratory: Negative for chest tightness, cough and shortness of breath.   Cardiovascular: Negative for chest pain, leg swelling and palpitations.  Gastrointestinal: Negative for abdominal distention, abdominal pain, constipation, diarrhea, nausea and vomiting.  Endocrine: Negative for hot flashes.  Genitourinary: Negative for difficulty urinating.   Musculoskeletal: Positive for arthralgias.  Skin: Negative  for itching and rash.  Neurological: Negative for dizziness, headaches and numbness.  Hematological: Negative for adenopathy. Does not bruise/bleed easily.  Psychiatric/Behavioral: Negative for depression. The patient is not nervous/anxious.    Breast: Denies any new nodularity, masses, tenderness, nipple changes, or nipple discharge.       PAST MEDICAL/SURGICAL HISTORY:  Past Medical History:  Diagnosis Date  . Allergic rhinitis   . Anemia, unspecified 05/30/2013  . Anxiety   . Breast cancer ([place]) 03/31/13   left  . Cancer ([place])   . Chest pain 2009   Consultation-[place], negative chest CT; nl echo in 2005; h/o palpitations  . Chiari malformation type I ([place])    s/p suboccipital craniectomy, C1 laminectomy, superior C2 laminectomy, duraplasty 05/10/14  . Colitis 2010   not IBD  . Degenerative joint disease    + degenerative joint disease of the lumbosacral spine  . Depression   . Diabetes ([place])   . Diabetes ([place])   . Dysrhythmia    palpations  . GERD (gastroesophageal reflux disease)    "a little"  . Headache   . Heart murmur    "small"  . Herpes simplex type II infection   . Hypercholesteremia    "slightly high"  . Hypertension    does not have high blood pressure  . 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 Palpitations   . Personal history of chemotherapy   . Personal history of radiation therapy   . Wears glasses    Past Surgical History:  Procedure Laterality Date  . ABDOMINAL HYSTERECTOMY  03/13/2007   TAH ?BSO--Dr Levin Bacon, [place]  . BREAST EXCISIONAL BIOPSY  04/2003   Left; benign disease  . BREAST LUMPECTOMY Left 09/2013  . BREAST LUMPECTOMY WITH NEEDLE LOCALIZATION AND AXILLARY LYMPH NODE DISSECTION Left 09/12/2013   Procedure: BREAST LUMPECTOMY WITH NEEDLE LOCALIZATION AND AXILLARY LYMPH NODE DISSECTION;  Surgeon: [name], MD;  Location: [place];  Service: General;  Laterality: Left;  and axilla  . BREAST SURGERY    . COLONOSCOPY  10/2010     proctitis; melanosis coli  . COLONOSCOPY WITH PROPOFOL N/A 01/04/2018   Dr. [name]: hemorrhoids, follow up colonoscopy in 10 years  . EAR CYST EXCISION Right 09/04/2019   Procedure: EXCISION EAR MASS;  Surgeon: [name], MD;  Location: [place];  Service: ENT;  Laterality: Right;  . EXCISION/RELEASE BURSA HIP Left 03/17/2019   Procedure: Left hip open bursectomy;  Surgeon: Nicholes Stairs, MD;  Location: [place];  Service: Orthopedics;  Laterality: Left;  75 mins  . EYE SURGERY Left    "fix lazy eye"  . PORTACATH PLACEMENT Right 04/21/2013   Procedure: INSERTION PORT-A-CATH;  Surgeon: [name], MD;  Location: [facility];  Service: General;  Laterality: Right;  . RIGHT OOPHORECTOMY  03/13/2007  . SUBOCCIPITAL CRANIECTOMY CERVICAL LAMINECTOMY N/A 05/10/2014   Procedure: SUBOCCIPITAL CRANIECTOMY CERVICAL LAMINECTOMY/DURAPLASTY;  Surgeon: Herbie Baltimore  Virgie Dad, MD;  Location: [facility];  Service: Neurosurgery;  Laterality: N/A;  suboccipital craniectomy with cervical laminectomy and duraplasty  . TUBAL LIGATION       ALLERGIES:  Allergies  Allergen Reactions  . Doxycycline Itching and Swelling     CURRENT MEDICATIONS:  Outpatient Encounter Medications as of 01/23/2020  Medication Sig  . acyclovir (ZOVIRAX) 400 MG tablet TAKE 1 TABLET BY MOUTH TWICE A DAY  . atenolol (TENORMIN) 50 MG tablet Take 1 tablet (50 mg total) by mouth 2 (two) times daily.  Marland Kitchen atorvastatin (LIPITOR) 20 MG tablet Take 1 tablet (20 mg total) by mouth daily at 6 PM.  . blood glucose meter kit and supplies Dispense based on patient and insurance preference. Use up to test glucose once daily. For ICD -10 E11.9.  Marland Kitchen cholecalciferol (VITAMIN D3) 25 MCG (1000 UT) tablet Take 1,000 Units by mouth daily.  . clotrimazole-betamethasone (LOTRISONE) cream Apply 1 application topically 2 (two) times daily.  Marland Kitchen EPINEPHrine 0.3 mg/0.3 mL IJ SOAJ injection Inject 0.3 mg into the muscle as needed for anaphylaxis.   Marland Kitchen  escitalopram (LEXAPRO) 20 MG tablet Take 1 tablet (20 mg total) by mouth daily.  . Lancets (ONETOUCH DELICA PLUS [id number]) MISC USE TO TEST BLOOD GLUCOSE DAILY  . levocetirizine (XYZAL) 5 MG tablet Take 5 mg by mouth every evening.  Marland Kitchen LORazepam (ATIVAN) 2 MG tablet Take one tablet po up to BID prn anxiety  . Melatonin 3 MG CAPS Take 3 mg by mouth at bedtime.  . metFORMIN (GLUCOPHAGE XR) 500 MG 24 hr tablet Take 1 tablet (500 mg total) by mouth daily with breakfast.  . ONE TOUCH ULTRA TEST test strip USE TO TEST BLOOD GLUCOSE DAILY  . pantoprazole (PROTONIX) 40 MG tablet Take 1 tablet (40 mg total) by mouth daily before breakfast.  . [DISCONTINUED] potassium chloride SA (K-DUR,KLOR-CON) 20 MEQ tablet Take 1 tablet (20 mEq total) by mouth 2 (two) times daily.   No facility-administered encounter medications on file as of 01/23/2020.     ONCOLOGIC FAMILY HISTORY:  Family History  Problem Relation Age of Onset  . Aneurysm Mother        Cerebral  . Hypertension Mother   . Hyperlipidemia Mother   . Stroke Mother   . Coronary artery disease Father   . Hypertension Father   . Hyperlipidemia Father   . Heart attack Father        d. 87  . Lung cancer Father        dx early 67s; former smoker  . Lung cancer Brother   . Lung cancer Brother        d. 30y; smoker  . Lung cancer Sister        paternal half-sister; not a smoker; dx <60; d. 61y  . Leukemia Maternal Aunt        "blood cancer"; d. unspecified age  . Cervical cancer Paternal Aunt        d. late 16s  . Breast cancer Sister 65  . Other Sister        hx of hysterectomy for unspecified reason  . Multiple myeloma Brother        d. 44s  . Lung cancer Brother        dx late 56s; smoker  . Prostate cancer Brother        dx late 45s  . Prostate cancer Brother        dx late 75s  . Lung  cancer Brother   . Lung cancer Maternal Aunt        d. unspecified age  . Breast cancer Maternal Aunt        dx unspecified age  . Prostate  cancer Cousin        maternal 1st cousin; dx older age  . Breast cancer Other        niece dx early 14s or before  . Leukemia Other        nephew d. 2y; "blood cancer"  . Colon cancer Neg Hx   . Diabetes Neg Hx     GENETIC COUNSELING/TESTING: 2017  SOCIAL HISTORY:  Social History   Socioeconomic History  . Marital status: Married    Spouse name: Not on file  . Number of children: 4  . Years of education: Not on file  . Highest education level: Not on file  Occupational History  . 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 Smoking status: Former Smoker    Packs/day: 0.50    Years: 15.00    Pack years: 7.50    Types: Cigarettes    Quit date: 07/06/1982    Years since quitting: 37.5  . Smokeless tobacco: Never Used  Vaping Use  . Vaping Use: Never used  Substance and Sexual Activity  . Alcohol use: No  . Drug use: No  . Sexual activity: Yes    Partners: Male    Birth control/protection: Surgical    Comment: TAH  Other Topics Concern  . Not on file  Social History Narrative  . Not on file   Social Determinants of Health   Financial Resource Strain:   . Difficulty of Paying Living Expenses:   Food Insecurity:   . Worried About Charity fundraiser in the Last Year:   . Arboriculturist in the Last Year:   Transportation Needs:   . Film/video editor (Medical):   Marland Kitchen Lack of Transportation (Non-Medical):   Physical Activity:   . Days of Exercise per Week:   . Minutes of Exercise per Session:   Stress:   . Feeling of Stress :   Social Connections:   . Frequency of Communication with Friends and Family:   . Frequency of Social Gatherings with Friends and Family:   . Attends Religious Services:   . Active Member of Clubs or Organizations:   . Attends Archivist Meetings:   Marland Kitchen Marital Status:   Intimate Partner Violence:   . Fear of Current or Ex-Partner:   . Emotionally Abused:   Marland Kitchen Physically Abused:   . Sexually  Abused:       PHYSICAL EXAMINATION:  Vital Signs: Vitals:   01/23/20 1103  BP: 140/70  Pulse: 94  Resp: 18  Temp: 98.9 F (37.2 C)  SpO2: 98%   Filed Weights   01/23/20 1103  Weight: 175 lb 6.4 oz (79.6 kg)   General: Well-nourished, well-appearing female in no acute distress.  Unaccompanied today.   HEENT: Head is normocephalic.  Pupils equal and reactive to light. Conjunctivae clear without exudate.  Sclerae anicteric. Oral mucosa is pink, moist.  Oropharynx is pink without lesions or erythema. No neck swelling noted. Lymph: No cervical, supraclavicular, or infraclavicular lymphadenopathy noted on palpation.  Cardiovascular: Regular rate and rhythm.Marland Kitchen Respiratory: Clear to auscultation bilaterally. Chest expansion symmetric; breathing non-labored.  Breast Exam:  -Left breast: No appreciable masses on palpation. No skin redness, thickening, or peau d'orange  appearance; no nipple retraction or nipple discharge; mild distortion in symmetry at previous lumpectomy site well healed scar without erythema or nodularity.  -Right breast: No appreciable masses on palpation. No skin redness, thickening, or peau d'orange appearance; no nipple retraction or nipple discharge; -Axilla: No axillary adenopathy bilaterally.  GI: Abdomen soft and round; non-tender, non-distended. Bowel sounds normoactive. No hepatosplenomegaly.   GU: Deferred.  Neuro: No focal deficits. Steady gait.  Psych: Mood and affect normal and appropriate for situation.  MSK: No focal spinal tenderness to palpation, full range of motion in bilateral upper extremities Extremities: No edema. Skin: Warm and dry.  LABORATORY DATA:  None for this visit   DIAGNOSTIC IMAGING:  Most recent mammogram:     ASSESSMENT AND PLAN:  Ms.. [name] is a pleasant 59 y.o. female with history of Stage IIIA left breast invasive ductal carcinoma, ER+/PR-/HER2+, diagnosed in 03/2013, treated with neoadjuvant chemotherapy,  lumpectomy,  adjuvant radiation therapy, maintenance trastuzumab x 1 year and anti-estrogen therapy with Anastrozole changed to Tamoxifen and completed in 05/2018.  She presents to the Survivorship Clinic for surveillance and routine follow-up.   1. History of breast cancer:  [name] [name] is currently clinically and radiographically without evidence of disease or recurrence of breast cancer. She will be due for mammogram in 08/2020.  She will f/u with her PCP/NP for breast cancer surveillance.  As she is 7 years out from her initial diagnosis.  We are happy to see her at any point in the future if the need arises.     2. Neck Swelling: I do not appreciate this on exam and she has no lymphadenopathy.  Recommend monitoring this and f/u with PCP if worsens, or remains persistent.  3. Bone health:  She was given education on specific food and activities to promote bone health.  4. Cancer screening:  Due to [name] [name]'s history and her age, she should receive screening for skin cancers, colon cancer, and gynecologic cancers. She was encouraged to follow-up with her PCP for appropriate cancer screenings.   5. Health maintenance and wellness promotion: [name] [name] was encouraged to consume 5-7 servings of fruits and vegetables per day. She was also encouraged to engage in moderate to vigorous exercise for 30 minutes per day most days of the week. She was instructed to limit her alcohol consumption and continue to abstain from tobacco use/was encouraged stop smoking.      Dispo:  -Return to cancer center PRN -Mammogram due in 08/2020  Total encounter time: 30 minutes*   Wilber Bihari, NP 01/23/20 11:57 AM Medical Oncology and Hematology [facility] [place], [place] [address] Tel. [phone]    Fax. [phone]  *Total Encounter Time as defined by the Centers for Medicare and Medicaid Services includes, in addition to the face-to-face time of a patient visit (documented in the note  above) non-face-to-face time: obtaining and reviewing outside history, ordering and reviewing medications, tests or procedures, care coordination (communications with other health care professionals or caregivers) and documentation in the medical record.    Note: PRIMARY CARE PROVIDER Erven Colla, [place] [phone]

## 2020-01-24 ENCOUNTER — Telehealth: Payer: Self-pay | Admitting: Adult Health

## 2020-01-24 NOTE — Telephone Encounter (Signed)
Scheduled appts per 7/20 los. Called pt to go over appts, lost phone call and was not able to get back in touch with pt. Mailed appt reminder and calendar.

## 2020-01-31 ENCOUNTER — Telehealth: Payer: Self-pay | Admitting: *Deleted

## 2020-01-31 NOTE — Telephone Encounter (Signed)
Patient scheduled with Dr Zigmund Daniel per Dr Glo Herring for Aug 11 at 12pm  Pt notified.

## 2020-02-09 ENCOUNTER — Other Ambulatory Visit: Payer: Self-pay

## 2020-02-09 ENCOUNTER — Ambulatory Visit (INDEPENDENT_AMBULATORY_CARE_PROVIDER_SITE_OTHER): Payer: Medicare HMO | Admitting: Nurse Practitioner

## 2020-02-09 ENCOUNTER — Encounter: Payer: Self-pay | Admitting: Nurse Practitioner

## 2020-02-09 VITALS — BP 130/74 | HR 72 | Temp 97.3°F | Ht 61.0 in | Wt 176.0 lb

## 2020-02-09 DIAGNOSIS — Z0000 Encounter for general adult medical examination without abnormal findings: Secondary | ICD-10-CM

## 2020-02-09 DIAGNOSIS — Z01419 Encounter for gynecological examination (general) (routine) without abnormal findings: Secondary | ICD-10-CM

## 2020-02-09 NOTE — Progress Notes (Signed)
Subjective:    Patient ID: [name] [name], female    DOB: 1960/10/01, 59 y.o.   MRN: [id number]  HPI The patient comes in today for a wellness visit.    A review of their health history was completed.  A review of medications was also completed.  Any needed refills; none  Eating habits: not health conscious; does well overall  Falls/  MVA accidents in past few months: none  Regular exercise: walking and taking care of her foster children  Specialist pt sees on regular basis: none  Preventative health issues were discussed.   Additional concerns: weight Saw her oncologist 2 weeks ago for her yearly check up; they schedule her mammogram. 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 Also sees orthopedic for multiple joint issues. Has had hysterectomy with Rt. BSO.  Uses melatonin for sleep.  Depression screen [facility] 2/9 02/09/2020 06/20/2018 03/29/2018 10/18/2017  Decreased Interest 1 1 0 0  Down, Depressed, Hopeless 1 0 0 0  PHQ - 2 Score 2 1 0 0  Altered sleeping 1 1 - 0  Tired, decreased energy 1 1 - 1  Change in appetite 0 0 - 1  Feeling bad or failure about yourself  1 0 - 0  Trouble concentrating 1 0 - 0  Moving slowly or fidgety/restless 0 0 - 0  Suicidal thoughts 0 0 - 0  PHQ-9 Score 6 3 - 2  Some recent data might be hidden      Review of Systems  Constitutional: Negative for activity change, appetite change, fatigue and fever.  HENT: Negative for sore throat and trouble swallowing.   Respiratory: Negative for cough, chest tightness, shortness of breath and wheezing.   Cardiovascular: Negative for chest pain.  Gastrointestinal: Negative for abdominal distention, abdominal pain, blood in stool, constipation, diarrhea, nausea and vomiting.  Genitourinary: Negative for difficulty urinating, dysuria, enuresis, frequency, genital sores, menstrual problem, pelvic pain, urgency, vaginal bleeding and vaginal discharge.       Has very  decreased libido. Married same sexual partner.        Objective:   Physical Exam Constitutional:      General: She is not in acute distress.    Appearance: She is well-developed.  Neck:     Thyroid: No thyromegaly.     Trachea: No tracheal deviation.     Comments: Thyroid non tender. No mass or goiter noted.  Cardiovascular:     Rate and Rhythm: Normal rate and regular rhythm.     Heart sounds: Normal heart sounds. No murmur heard.  No gallop.   Pulmonary:     Effort: Pulmonary effort is normal.     Breath sounds: Normal breath sounds.  Chest:     Breasts:        Right: No inverted nipple, mass, skin change or tenderness.        Left: Skin change and tenderness present. No inverted nipple or mass.     Comments: Significant scarring outer left breast from previous procedures. This area is tender and sensitive to touch. No mass noted.  Abdominal:     General: There is no distension.     Palpations: Abdomen is soft. There is no mass.     Tenderness: There is no abdominal tenderness.  Genitourinary:    Vagina: Normal. No vaginal discharge.     Comments: External GU: no rashes or lesions. Vagina: small amount of white mucoid discharge noted. Bimanual exam: no tenderness or obvious masses.  Musculoskeletal:     Cervical back: Normal range of motion and neck supple.  Lymphadenopathy:     Cervical: No cervical adenopathy.     Upper Body:     Right upper body: No supraclavicular, axillary or pectoral adenopathy.     Left upper body: No supraclavicular, axillary or pectoral adenopathy.  Skin:    General: Skin is warm and dry.     Findings: No rash.  Neurological:     Mental Status: She is alert and oriented to person, place, and time.  Psychiatric:        Mood and Affect: Mood normal.        Behavior: Behavior normal.        Thought Content: Thought content normal.        Judgment: Judgment normal.    Today's Vitals   02/09/20 1334  BP: 130/74  Pulse: 72  Temp: (!) 97.3  F (36.3 C)  SpO2: 96%  Weight: 176 lb (79.8 kg)  Height: 5\' 1"  (1.549 m)   Body mass index is 33.25 kg/m. Weight stable.  Diabetic Foot Exam - Simple   Simple Foot Form Diabetic Foot exam was performed with the following findings: Yes 02/09/2020  1:40 PM  Visual Inspection No deformities, no ulcerations, no other skin breakdown bilaterally: Yes Sensation Testing Intact to touch and monofilament testing bilaterally: Yes Pulse Check Posterior Tibialis and Dorsalis pulse intact bilaterally: Yes Comments         Assessment & Plan:  Well woman exam  Follow with her specialists as planned.  Discussed healthy diet with weight loss goal of 10 lbs over the next 3 months.  Labs up to date from May. Continue current medications as directed. Recommend Tdap at local pharmacy.  Recommend flu vaccine this fall.  Return in about 3 months (around 05/11/2020) for Recheck chronic health issues.

## 2020-02-10 ENCOUNTER — Encounter: Payer: Self-pay | Admitting: Nurse Practitioner

## 2020-02-13 ENCOUNTER — Telehealth: Payer: Self-pay | Admitting: Family Medicine

## 2020-02-13 NOTE — Telephone Encounter (Signed)
Pt dropped off [place] [employer] form for [place] that needs to be completed. Form Placed in Dr Lovena Le folder.

## 2020-02-14 DIAGNOSIS — Z789 Other specified health status: Secondary | ICD-10-CM | POA: Diagnosis not present

## 2020-02-14 DIAGNOSIS — R152 Fecal urgency: Secondary | ICD-10-CM | POA: Diagnosis not present

## 2020-02-14 DIAGNOSIS — R32 Unspecified urinary incontinence: Secondary | ICD-10-CM | POA: Diagnosis not present

## 2020-02-14 DIAGNOSIS — R159 Full incontinence of feces: Secondary | ICD-10-CM | POA: Insufficient documentation

## 2020-02-14 DIAGNOSIS — E1169 Type 2 diabetes mellitus with other specified complication: Secondary | ICD-10-CM | POA: Diagnosis not present

## 2020-02-15 NOTE — Telephone Encounter (Signed)
Form is now in your box.

## 2020-02-15 NOTE — Telephone Encounter (Signed)
Do we know where this form is?  I don't have it.   D.r Obed Samek

## 2020-02-16 NOTE — Telephone Encounter (Signed)
Pt states form needs to be turned in on the 19th

## 2020-02-16 NOTE — Telephone Encounter (Signed)
Completed.

## 2020-02-16 NOTE — Telephone Encounter (Signed)
Form on carolyn's desk since carolyn did physical

## 2020-04-04 ENCOUNTER — Other Ambulatory Visit: Payer: Self-pay | Admitting: Family Medicine

## 2020-04-04 NOTE — Telephone Encounter (Signed)
Wellness 02/09/20

## 2020-04-05 ENCOUNTER — Encounter (HOSPITAL_COMMUNITY): Payer: Self-pay | Admitting: Physical Therapy

## 2020-04-05 NOTE — Therapy (Signed)
[place], [place], [address] Phone: [phone]   Fax:  [phone]  Patient Details  Name: [name] [name] MRN: [id number] Date of Birth: 06-03-1961 Referring Provider:  No ref. provider found  Encounter Date: 04/05/2020   Pt will be discharged as she did not return for further visits. Rayetta Humphrey, PT CLT [phone] 04/05/2020, 8:32 AM  [facility] [place], [place], [address] Phone: [phone]   Fax:  [phone]

## 2020-04-20 DIAGNOSIS — R69 Illness, unspecified: Secondary | ICD-10-CM | POA: Diagnosis not present

## 2020-04-20 DIAGNOSIS — B029 Zoster without complications: Secondary | ICD-10-CM | POA: Diagnosis not present

## 2020-04-20 DIAGNOSIS — K219 Gastro-esophageal reflux disease without esophagitis: Secondary | ICD-10-CM | POA: Diagnosis not present

## 2020-04-20 DIAGNOSIS — G4700 Insomnia, unspecified: Secondary | ICD-10-CM | POA: Diagnosis not present

## 2020-04-20 DIAGNOSIS — E785 Hyperlipidemia, unspecified: Secondary | ICD-10-CM | POA: Diagnosis not present

## 2020-04-20 DIAGNOSIS — E11620 Type 2 diabetes mellitus with diabetic dermatitis: Secondary | ICD-10-CM | POA: Diagnosis not present

## 2020-04-20 DIAGNOSIS — J301 Allergic rhinitis due to pollen: Secondary | ICD-10-CM | POA: Diagnosis not present

## 2020-04-20 DIAGNOSIS — I10 Essential (primary) hypertension: Secondary | ICD-10-CM | POA: Diagnosis not present

## 2020-04-20 DIAGNOSIS — F419 Anxiety disorder, unspecified: Secondary | ICD-10-CM | POA: Diagnosis not present

## 2020-04-20 DIAGNOSIS — E669 Obesity, unspecified: Secondary | ICD-10-CM | POA: Diagnosis not present

## 2020-05-08 ENCOUNTER — Encounter: Payer: Self-pay | Admitting: Family Medicine

## 2020-05-08 ENCOUNTER — Other Ambulatory Visit: Payer: Self-pay

## 2020-05-08 ENCOUNTER — Ambulatory Visit (INDEPENDENT_AMBULATORY_CARE_PROVIDER_SITE_OTHER): Payer: Medicare HMO | Admitting: Family Medicine

## 2020-05-08 VITALS — BP 110/74 | Temp 97.8°F | Wt 176.2 lb

## 2020-05-08 DIAGNOSIS — Z170 Estrogen receptor positive status [ER+]: Secondary | ICD-10-CM

## 2020-05-08 DIAGNOSIS — Z853 Personal history of malignant neoplasm of breast: Secondary | ICD-10-CM | POA: Diagnosis not present

## 2020-05-08 DIAGNOSIS — R221 Localized swelling, mass and lump, neck: Secondary | ICD-10-CM | POA: Diagnosis not present

## 2020-05-08 DIAGNOSIS — R590 Localized enlarged lymph nodes: Secondary | ICD-10-CM | POA: Diagnosis not present

## 2020-05-08 DIAGNOSIS — C50412 Malignant neoplasm of upper-outer quadrant of left female breast: Secondary | ICD-10-CM

## 2020-05-08 NOTE — Progress Notes (Signed)
Patient ID: [name] [name], female    DOB: 04/15/1961, 59 y.o.   MRN: [id number]   Chief Complaint  Patient presents with  . neck swelling    Patient reports swelling on right side of her neck from ear down to under arm x 1 month with itching.    Subjective:    HPI  Swelling on the right side of neck.  Feeling tenderness on rt side of neck and in past had tenderness in rt axillary area.   For a while having this swelling on rt side of neck. Pt feeling it has worsened this past weekend. Noticed some hoarseness in voice lasting 1 day and resolved.  H/o acid reflux and had vomiting. 1 x vomiting at night, then nausea for 2 days.  No other illness at home.  H/o breast cancer in 2014  Seeing oncology yearly with [facility].  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  Medical History Dana has a past medical history of Allergic rhinitis, Anemia, unspecified (05/30/2013), Anxiety, Breast cancer ([place]) (03/31/13), Cancer ([place]), Chest pain (2009), Chiari malformation type I ([place]), Colitis (2010), Degenerative joint disease, Depression, Diabetes ([place]), Diabetes ([place]), Dysrhythmia, GERD (gastroesophageal reflux disease), Headache, Heart murmur, Herpes simplex type II infection, Hypercholesteremia, Hypertension, Meningitis (08/22/2014), Palpitations, Personal history of chemotherapy, Personal history of radiation therapy, and Wears glasses.   Outpatient Encounter Medications as of 05/08/2020  Medication Sig  . acyclovir (ZOVIRAX) 400 MG tablet TAKE 1 TABLET BY MOUTH TWICE A DAY  . atenolol (TENORMIN) 50 MG tablet Take 1 tablet (50 mg total) by mouth 2 (two) times daily.  Marland Kitchen atorvastatin (LIPITOR) 20 MG tablet Take 1 tablet (20 mg total) by mouth daily at 6 PM.  . blood glucose meter kit and supplies Dispense based on patient and insurance preference. Use up to test glucose once daily. For ICD -10 E11.9.  .  EPINEPHrine 0.3 mg/0.3 mL IJ SOAJ injection Inject 0.3 mg into the muscle as needed for anaphylaxis.   Marland Kitchen escitalopram (LEXAPRO) 20 MG tablet Take 1 tablet (20 mg total) by mouth daily.  . Lancets (ONETOUCH DELICA PLUS [id number]) MISC USE TO TEST BLOOD GLUCOSE DAILY  . LORazepam (ATIVAN) 2 MG tablet Take one tablet po up to BID prn anxiety  . Melatonin 3 MG CAPS Take 3 mg by mouth at bedtime.  . metFORMIN (GLUCOPHAGE XR) 500 MG 24 hr tablet Take 1 tablet (500 mg total) by mouth daily with breakfast.  . ONE TOUCH ULTRA TEST test strip USE TO TEST BLOOD GLUCOSE DAILY  . pantoprazole (PROTONIX) 40 MG tablet Take 1 tablet (40 mg total) by mouth daily before breakfast.  . [DISCONTINUED] cholecalciferol (VITAMIN D3) 25 MCG (1000 UT) tablet Take 1,000 Units by mouth daily.  . [DISCONTINUED] clotrimazole-betamethasone (LOTRISONE) cream Apply 1 application topically 2 (two) times daily.  . [DISCONTINUED] fexofenadine (ALLEGRA) 180 MG tablet Take 180 mg by mouth daily.  . [DISCONTINUED] levocetirizine (XYZAL) 5 MG tablet Take 5 mg by mouth every evening.  . [DISCONTINUED] potassium chloride SA (K-DUR,KLOR-CON) 20 MEQ tablet Take 1 tablet (20 mEq total) by mouth 2 (two) times daily.   No facility-administered encounter medications on file as of 05/08/2020.     Review of Systems  Constitutional: Negative for chills and fever.  HENT: Negative for congestion, rhinorrhea and sore throat.   Respiratory: Negative for cough, shortness of breath and wheezing.   Cardiovascular: Negative  for chest pain and leg swelling.  Gastrointestinal: Negative for abdominal pain, diarrhea, nausea and vomiting.  Genitourinary: Negative for dysuria and frequency.  Musculoskeletal: Negative for arthralgias and back pain.       +pain rt axillary and rt neck swelling  Skin: Negative for rash.  Neurological: Negative for dizziness, weakness and headaches.     Vitals BP 110/74   Temp 97.8 F (36.6 C)   Wt 176 lb 3.2 oz  (79.9 kg)   LMP 12/04/2008   BMI 33.29 kg/m   Objective:   Physical Exam Vitals and nursing note reviewed.  Constitutional:      General: She is not in acute distress.    Appearance: Normal appearance.  HENT:     Head: Normocephalic and atraumatic.     Right Ear: Tympanic membrane normal.     Left Ear: Tympanic membrane normal.     Nose: Nose normal.     Mouth/Throat:     Mouth: Mucous membranes are moist.     Pharynx: No oropharyngeal exudate or posterior oropharyngeal erythema.  Eyes:     Extraocular Movements: Extraocular movements intact.     Conjunctiva/sclera: Conjunctivae normal.     Pupils: Pupils are equal, round, and reactive to light.  Cardiovascular:     Rate and Rhythm: Normal rate and regular rhythm.  Pulmonary:     Effort: Pulmonary effort is normal. No respiratory distress.     Breath sounds: Normal breath sounds.  Musculoskeletal:     Cervical back: Normal range of motion and neck supple. Tenderness (rt anterior neck, no enlarged LN palpated. mild swelling on rt side of neck.  no rash or erythema) present.  Lymphadenopathy:     Cervical: No cervical adenopathy.  Skin:    General: Skin is warm and dry.     Findings: No rash.  Neurological:     General: No focal deficit present.     Mental Status: She is alert and oriented to person, place, and time.     Cranial Nerves: No cranial nerve deficit.      Assessment and Plan   1. Cervical adenopathy - TSH - T4 - CBC With Differential - US Soft Tissue Head/Neck (NON-THYROID)  2. Neck swelling - TSH - T4 - CBC With Differential - US Soft Tissue Head/Neck (NON-THYROID)  3. Malignant neoplasm of upper-outer quadrant of left breast in female, estrogen receptor positive ([place])  4. Hx of breast cancer - MM Digital Diagnostic Bilat - US BREAST LTD UNI RIGHT INC AXILLA - US BREAST LTD UNI LEFT INC AXILLA - US Soft Tissue Head/Neck (NON-THYROID)  5. Axillary adenopathy - MM Digital Diagnostic Bilat -  US BREAST LTD UNI RIGHT INC AXILLA - US BREAST LTD UNI LEFT INC AXILLA    Ordered ultrasound left and right axilla and neck u/s to evaluate rt sided neck swelling. Will call pt with results.  Call or rto if worsening or any pain. Pt in agreement.  F/u 2 wks to review u/s and labs.

## 2020-05-09 LAB — CBC WITH DIFFERENTIAL
Basophils Absolute: 0 10*3/uL (ref 0.0–0.2)
Basos: 0 %
EOS (ABSOLUTE): 0.1 10*3/uL (ref 0.0–0.4)
Eos: 1 %
Hematocrit: 38 % (ref 34.0–46.6)
Hemoglobin: 12.3 g/dL (ref 11.1–15.9)
Immature Grans (Abs): 0 10*3/uL (ref 0.0–0.1)
Immature Granulocytes: 0 %
Lymphocytes Absolute: 2.8 10*3/uL (ref 0.7–3.1)
Lymphs: 31 %
MCH: 26.3 pg — ABNORMAL LOW (ref 26.6–33.0)
MCHC: 32.4 g/dL (ref 31.5–35.7)
MCV: 81 fL (ref 79–97)
Monocytes Absolute: 0.7 10*3/uL (ref 0.1–0.9)
Monocytes: 8 %
Neutrophils Absolute: 5.5 10*3/uL (ref 1.4–7.0)
Neutrophils: 60 %
RBC: 4.68 x10E6/uL (ref 3.77–5.28)
RDW: 14.7 % (ref 11.7–15.4)
WBC: 9.2 10*3/uL (ref 3.4–10.8)

## 2020-05-09 LAB — T4: T4, Total: 7.3 ug/dL (ref 4.5–12.0)

## 2020-05-09 LAB — TSH: TSH: 1.99 u[IU]/mL (ref 0.450–4.500)

## 2020-05-10 ENCOUNTER — Other Ambulatory Visit: Payer: Self-pay | Admitting: Family Medicine

## 2020-05-10 DIAGNOSIS — N644 Mastodynia: Secondary | ICD-10-CM

## 2020-05-10 DIAGNOSIS — Z853 Personal history of malignant neoplasm of breast: Secondary | ICD-10-CM

## 2020-05-15 ENCOUNTER — Ambulatory Visit
Admission: RE | Admit: 2020-05-15 | Discharge: 2020-05-15 | Disposition: A | Discharge status: Home / Self Care | Payer: Medicare HMO | Source: Ambulatory Visit | Attending: Family Medicine | Admitting: Family Medicine

## 2020-05-15 DIAGNOSIS — R221 Localized swelling, mass and lump, neck: Secondary | ICD-10-CM | POA: Diagnosis not present

## 2020-05-20 ENCOUNTER — Other Ambulatory Visit: Payer: Medicare HMO

## 2020-05-20 ENCOUNTER — Encounter: Payer: Self-pay | Admitting: *Deleted

## 2020-05-21 ENCOUNTER — Other Ambulatory Visit: Payer: Self-pay

## 2020-05-21 ENCOUNTER — Ambulatory Visit
Admission: RE | Admit: 2020-05-21 | Discharge: 2020-05-21 | Disposition: A | Discharge status: Home / Self Care | Payer: Medicare HMO | Source: Ambulatory Visit | Attending: Family Medicine | Admitting: Family Medicine

## 2020-05-21 DIAGNOSIS — R928 Other abnormal and inconclusive findings on diagnostic imaging of breast: Secondary | ICD-10-CM | POA: Diagnosis not present

## 2020-05-21 DIAGNOSIS — N6489 Other specified disorders of breast: Secondary | ICD-10-CM | POA: Diagnosis not present

## 2020-05-22 ENCOUNTER — Ambulatory Visit: Payer: Medicare HMO | Admitting: Family Medicine

## 2020-05-23 ENCOUNTER — Ambulatory Visit: Payer: Medicare HMO | Attending: Internal Medicine

## 2020-05-23 DIAGNOSIS — Z23 Encounter for immunization: Secondary | ICD-10-CM

## 2020-05-23 NOTE — Progress Notes (Signed)
   Covid-19 Vaccination Clinic  Name:  Silvana Holecek    MRN: [id number] DOB: [birth year]  05/23/2020  Ms. Stapleton was observed post Covid-19 immunization for 15 minutes without incident. She was provided with Vaccine Information Sheet and instruction to access the V-Safe system.   Ms. Mccarron was instructed to call 911 with any severe reactions post vaccine: Marland Kitchen Difficulty breathing  . Swelling of face and throat  . A fast heartbeat  . A bad rash all over body  . Dizziness and weakness   Immunizations Administered    No immunizations on file.

## 2020-05-24 ENCOUNTER — Other Ambulatory Visit: Payer: Self-pay

## 2020-05-24 MED ORDER — PANTOPRAZOLE SODIUM 40 MG PO TBEC
40.0000 mg | DELAYED_RELEASE_TABLET | Freq: Every day | ORAL | 1 refills | Status: DC
Start: 2020-05-24 — End: 2020-10-03

## 2020-06-04 ENCOUNTER — Other Ambulatory Visit: Payer: Self-pay

## 2020-06-04 MED ORDER — ATORVASTATIN CALCIUM 20 MG PO TABS
20.0000 mg | ORAL_TABLET | Freq: Every day | ORAL | 1 refills | Status: DC
Start: 2020-06-04 — End: 2021-02-03

## 2020-06-07 ENCOUNTER — Telehealth: Payer: Self-pay

## 2020-06-07 ENCOUNTER — Other Ambulatory Visit: Payer: Self-pay | Admitting: Nurse Practitioner

## 2020-06-07 MED ORDER — METFORMIN HCL ER 500 MG PO TB24: 500.0000 mg | ORAL_TABLET | Freq: Every day | ORAL | 0 refills | Status: DC

## 2020-06-07 NOTE — Telephone Encounter (Signed)
Done

## 2020-06-07 NOTE — Telephone Encounter (Signed)
Pt needs refill on metFORMIN (GLUCOPHAGE XR) 500 MG 24 hr tablet  Sent to [id number] - [place], [place] - [place] [facility] was sent several times pt is out of meds   Pt call back [phone]

## 2020-07-08 ENCOUNTER — Other Ambulatory Visit: Payer: Self-pay | Admitting: Nurse Practitioner

## 2020-07-09 MED ORDER — LORAZEPAM 2 MG PO TABS: ORAL_TABLET | ORAL | 0 refills | Status: DC

## 2020-07-09 NOTE — Telephone Encounter (Signed)
Pls give 30 tabs of the medications acyclovir, metformin. .  Ativan- only give 10 tablets.  Will discuss medications further at her upcoming visit. Dr. Ladona Ridgel

## 2020-07-11 ENCOUNTER — Encounter: Payer: Self-pay | Admitting: Family Medicine

## 2020-07-11 ENCOUNTER — Other Ambulatory Visit: Payer: Self-pay | Admitting: *Deleted

## 2020-07-11 ENCOUNTER — Telehealth (INDEPENDENT_AMBULATORY_CARE_PROVIDER_SITE_OTHER): Payer: Medicare HMO | Admitting: Family Medicine

## 2020-07-11 ENCOUNTER — Other Ambulatory Visit: Payer: Self-pay

## 2020-07-11 DIAGNOSIS — U071 COVID-19: Secondary | ICD-10-CM | POA: Diagnosis not present

## 2020-07-11 DIAGNOSIS — J208 Acute bronchitis due to other specified organisms: Secondary | ICD-10-CM

## 2020-07-11 MED ORDER — ESCITALOPRAM OXALATE 20 MG PO TABS: 20.0000 mg | ORAL_TABLET | Freq: Every day | ORAL | 0 refills | Status: DC

## 2020-07-11 MED ORDER — PREDNISONE 10 MG PO TABS: 10.0000 mg | ORAL_TABLET | Freq: Every day | ORAL | 0 refills | Status: DC

## 2020-07-11 MED ORDER — BENZONATATE 100 MG PO CAPS: 100.0000 mg | ORAL_CAPSULE | Freq: Two times a day (BID) | ORAL | 0 refills | Status: DC | PRN

## 2020-07-11 MED ORDER — ALBUTEROL SULFATE HFA 108 (90 BASE) MCG/ACT IN AERS: 2.0000 | INHALATION_SPRAY | Freq: Four times a day (QID) | RESPIRATORY_TRACT | 0 refills | Status: DC | PRN

## 2020-07-11 NOTE — Progress Notes (Addendum)
Patient ID: Levonne Carreras, female    DOB: 09-29-1960, 60 y.o.   MRN: [id number]   Chief Complaint  Patient presents with  . Covid Positive    Patient tested positive for covid 12/30. Most symptoms have cleared up but she can't seem to get rid of her dry cough. Has tried robitussin. Patient reports feeling sob with exertion since this started -no worse or better.     Subjective:  CC: COVID positive, dry cough, shortness of breath with exertion   This is a new problem.  Presents today via telephone visit with the complaint dry cough, shortness of breath with exertion.  Tested positive for COVID-19 on December 30.  Symptoms started on December 29.  Reports that everyone in the household has positive COVID except for her husband.  Denies fever, chills, has tried Robitussin with honey.  The cough is dry and hacking, and will not go away.  Does not have a way to monitor oxygen saturations at home.  Reports that shortness of breath resolves after 1 to 2 minutes with rest.  Virtual Visit via Telephone Note  I connected with [name] on 07/11/20 at  9:00 AM EST by telephone and verified that I am speaking with the correct person using two identifiers.  Location: Patient: home Provider: office    I discussed the limitations, risks, security and privacy concerns of performing an evaluation and management service by telephone and the availability of in person appointments. I also discussed with the patient that there may be a patient responsible charge related to this service. The patient expressed understanding and agreed to proceed.   History of Present Illness:    Observations/Objective:   Assessment and Plan:   Follow Up Instructions:    I discussed the assessment and treatment plan with the patient. The patient was provided an opportunity to ask questions and all were answered. The patient agreed with the plan and demonstrated an understanding of the instructions.   The  patient was advised to call back or seek an in-person evaluation if the symptoms worsen or if the condition fails to improve as anticipated.  I provided 15 minutes of non-face-to-face time during this encounter.  Medical History Nathaly has a past medical history of Allergic rhinitis, Anemia, unspecified (05/30/2013), Anxiety, Breast cancer ([place]) (03/31/13), Cancer ([address]), Chest pain (2009), Chiari malformation type I ([place]), Colitis (2010), Degenerative joint disease, Depression, Diabetes ([place]), Diabetes ([place]), Dysrhythmia, GERD (gastroesophageal reflux disease), Headache, Heart murmur, Herpes simplex type II infection, Hypercholesteremia, Hypertension, Meningitis (08/22/2014), Palpitations, Personal history of chemotherapy, Personal history of radiation therapy, and Wears glasses.   Outpatient Encounter Medications as of 07/11/2020  Medication Sig  . albuterol (VENTOLIN HFA) 108 (90 Base) MCG/ACT inhaler Inhale 2 puffs into the lungs every 6 (six) hours as needed for wheezing or shortness of breath.  . benzonatate (TESSALON) 100 MG capsule Take 1 capsule (100 mg total) by mouth 2 (two) times daily as needed for cough.  . predniSONE (DELTASONE) 10 MG tablet Take 1 tablet (10 mg total) by mouth daily with breakfast.  . acyclovir (ZOVIRAX) 400 MG tablet TAKE 1 TABLET BY MOUTH TWICE A DAY  . atenolol (TENORMIN) 50 MG tablet Take 1 tablet (50 mg total) by mouth 2 (two) times daily.  Marland Kitchen atorvastatin (LIPITOR) 20 MG tablet Take 1 tablet (20 mg total) by mouth daily at 6 PM.  . blood glucose meter kit and supplies Dispense based on patient and insurance preference. Use up to test glucose once  daily. For ICD -10 E11.9.  . EPINEPHrine 0.3 mg/0.3 mL IJ SOAJ injection Inject 0.3 mg into the muscle as needed for anaphylaxis.   Marland Kitchen escitalopram (LEXAPRO) 20 MG tablet Take 1 tablet (20 mg total) by mouth daily.  . Lancets (ONETOUCH DELICA PLUS [id number]) MISC USE TO TEST BLOOD GLUCOSE DAILY  . LORazepam (ATIVAN) 2 MG  tablet Take one tablet po up to BID prn anxiety  . Melatonin 3 MG CAPS Take 3 mg by mouth at bedtime.  . metFORMIN (GLUCOPHAGE-XR) 500 MG 24 hr tablet TAKE 1 TABLET BY MOUTH EVERY DAY WITH BREAKFAST  . ONE TOUCH ULTRA TEST test strip USE TO TEST BLOOD GLUCOSE DAILY  . pantoprazole (PROTONIX) 40 MG tablet Take 1 tablet (40 mg total) by mouth daily before breakfast.  . [DISCONTINUED] potassium chloride SA (K-DUR,KLOR-CON) 20 MEQ tablet Take 1 tablet (20 mEq total) by mouth 2 (two) times daily.   No facility-administered encounter medications on file as of 07/11/2020.     Review of Systems  Constitutional: Negative for chills and fever.  HENT: Positive for rhinorrhea.   Respiratory: Positive for cough and shortness of breath. Negative for wheezing.        With exertion, resolves with rest 1-2 minutes  Cardiovascular: Negative for chest pain.  Psychiatric/Behavioral: Negative for confusion.     Vitals LMP 12/04/2008  Does not have oxygen saturation meter at home Objective:   Physical Exam  Unable to perform.  Coughing throughout interview, able to converse with me on the telephone without obvious shortness of breath. Assessment and Plan   1. Acute bronchitis due to COVID-19 virus - predniSONE (DELTASONE) 10 MG tablet; Take 1 tablet (10 mg total) by mouth daily with breakfast.  Dispense: 7 tablet; Refill: 0 - benzonatate (TESSALON) 100 MG capsule; Take 1 capsule (100 mg total) by mouth 2 (two) times daily as needed for cough.  Dispense: 20 capsule; Refill: 0 - albuterol (VENTOLIN HFA) 108 (90 Base) MCG/ACT inhaler; Inhale 2 puffs into the lungs every 6 (six) hours as needed for wheezing or shortness of breath.  Dispense: 8 g; Refill: 0    Cough sounds dry and hacking.  Will treat with 7-day low-dose prednisone, and Tessalon Perls. Refill sent for Albuterol inhaler.  She is on lorazepam 2 mg once a day, will avoid any other sedating medications for cough.  She reports that when she is  exerting herself she has shortness of breath that resolves within 1 to 2 minutes with rest.  COVID-19 warning as stated below given.  She does not have a way to monitor her oxygen saturation at home.  Covid-19 warning:  Covid-19 is a virus that causes hypoxia (low oxygen level in blood) in some people. If you develop any changes in your usual breathing pattern: difficulty catching your breath, more short winded with activity or with resting, or anything that concerns you about your breathing, do not hesitate to go to the emergency department immediately for evaluation. Covid infection can also affect the way the brain functions if it lacks oxygen, such as, feeling dizzy, passing out, or feeling confused, if you experience any of these symptoms, please do not delay to seek treatment.  Some people experience gastrointestinal problems with Covid, such as vomiting and diarrhea, dehydration is a serious risk and should be avoided. If you are unable to keep liquids down you may need to go to the emergency department for intravenous fluids to avoid dehydration.   Please alert and involve your family  and/or friends to help keep an eye on you while you recover from Covid-19. If you have any questions or concerns about your recovery, please do not hesitate to call the office for guidance.   Prednisone warning: Prednisone is an anti-inflammatory medication. It is best to only use it for short periods of time. It can cause  your blood pressure to increase and it can cause your blood sugar to increase. If you are a diabetic, please check your blood sugar twice per day while you are taking prednisone. If your sugars are > 200 or < 70 please seek medical attention right away.   Quarantine guidelines:   You have tested positive for Covid-19 infection. The current CDC guidelines for quarantine regardless of vaccination status are:    Please quarantine and isolate at home for 5 days. - If you have no symptoms or  your symptoms are resolving after 5 days you   can leave the home. -Continue to wear a mask around others for an additional 5 days.  -If you have a fever continue to stay at home until your fever  resolves. Use over-the-counter medications for symptoms.If you develop respiratory issues/distress (see Covid warning), seek medical care in the Emergency Department.  If you must leave home or if you have to be around others please wear a mask. Please limit contact with immediate family members in the home, practice social distancing, frequent handwashing and clean hard surfaces touched frequently with household cleaning products. Members of your household will also need to quarantine for 5 days and test on day five if possible.You may also be contacted by the health department for follow up.    Pecolia Ades, FNP-C

## 2020-07-11 NOTE — Addendum Note (Signed)
Addended by: Novella Olive on: 07/11/2020 09:23 AM   Modules accepted: Orders

## 2020-07-19 ENCOUNTER — Other Ambulatory Visit: Payer: Self-pay | Admitting: Family Medicine

## 2020-07-26 DIAGNOSIS — H5203 Hypermetropia, bilateral: Secondary | ICD-10-CM | POA: Diagnosis not present

## 2020-07-31 ENCOUNTER — Ambulatory Visit (INDEPENDENT_AMBULATORY_CARE_PROVIDER_SITE_OTHER): Payer: Medicare HMO | Admitting: Family Medicine

## 2020-07-31 ENCOUNTER — Other Ambulatory Visit: Payer: Self-pay

## 2020-07-31 ENCOUNTER — Encounter: Payer: Self-pay | Admitting: Family Medicine

## 2020-07-31 VITALS — BP 108/68 | HR 85 | Temp 97.7°F | Wt 178.6 lb

## 2020-07-31 DIAGNOSIS — E785 Hyperlipidemia, unspecified: Secondary | ICD-10-CM

## 2020-07-31 DIAGNOSIS — J208 Acute bronchitis due to other specified organisms: Secondary | ICD-10-CM

## 2020-07-31 DIAGNOSIS — F419 Anxiety disorder, unspecified: Secondary | ICD-10-CM

## 2020-07-31 DIAGNOSIS — U071 COVID-19: Secondary | ICD-10-CM | POA: Diagnosis not present

## 2020-07-31 DIAGNOSIS — R69 Illness, unspecified: Secondary | ICD-10-CM | POA: Diagnosis not present

## 2020-07-31 DIAGNOSIS — F32A Depression, unspecified: Secondary | ICD-10-CM

## 2020-07-31 DIAGNOSIS — E1169 Type 2 diabetes mellitus with other specified complication: Secondary | ICD-10-CM

## 2020-07-31 DIAGNOSIS — E119 Type 2 diabetes mellitus without complications: Secondary | ICD-10-CM | POA: Diagnosis not present

## 2020-07-31 MED ORDER — METFORMIN HCL ER 500 MG PO TB24: ORAL_TABLET | ORAL | 1 refills | Status: DC

## 2020-07-31 MED ORDER — ALBUTEROL SULFATE HFA 108 (90 BASE) MCG/ACT IN AERS: 2.0000 | INHALATION_SPRAY | Freq: Four times a day (QID) | RESPIRATORY_TRACT | 0 refills | Status: DC | PRN

## 2020-07-31 MED ORDER — BENZONATATE 200 MG PO CAPS: 200.0000 mg | ORAL_CAPSULE | Freq: Two times a day (BID) | ORAL | 0 refills | Status: DC | PRN

## 2020-07-31 MED ORDER — LORAZEPAM 0.5 MG PO TABS: 0.5000 mg | ORAL_TABLET | Freq: Every evening | ORAL | 2 refills | Status: DC | PRN

## 2020-07-31 NOTE — Progress Notes (Signed)
Patient ID: [name] [name], female    DOB: 1960/10/01, 60 y.o.   MRN: [id number]   Chief Complaint  Patient presents with  . Diabetes    Patient reports high glucose readings for the past 2 weeks.   . Hypertension  . Anxiety  . Follow-up    Patient has had a cough on and off for about 1 month now since having covid.    Subjective:    HPI DM2- last 2 wks, noticing feeling dizzy and lightheaded and checked bg was in 200s. 135-170s, at times.  Pt stating taking medications as directed.   H/o covid- Dry coughing since having covid. Has been going on or about 1 month. Using inhaler, not using daily.  Not coughing daily. Chest soreness and back [name] when starting coughing.  Prednisone 7 days. Albuterol and tessalon perles.  History anxiety- Pt was on 0.92m ativan then inc to 235mbid in 2021. Pt was changed from 60m36manax in 2021 due to insurance reasons, pt was started on ativan at that time. Pt in agreement with decreasing back down to 0.5mg37mivan.    pt taking lexapro 20mg42m stating this is helping.   htn- pt taking atenolol.  No side effects.   Medical History [name] [name] has a past medical history of Allergic rhinitis, Anemia, unspecified (05/30/2013), Anxiety, Breast cancer (HCC) [place]26/14), Cancer (HCC),[name] [name] (2009), Chiari malformation type I (HCC),Griffinlitis (2010), Degenerative joint disease, Depression, Diabetes (HCC),Sorrentoabetes (HCC),[place]hythmia, GERD (gastroesophageal reflux disease), Headache, Heart murmur, Herpes simplex type II infection, Hypercholesteremia, Hypertension, Meningitis (08/22/2014), Palpitations, Personal history of chemotherapy, Personal history of radiation therapy, and Wears glasses.   Outpatient Encounter Medications as of 07/31/2020  Medication Sig  . benzonatate (TESSALON) 200 MG capsule Take 1 capsule (200 mg total) by mouth 2 (two) times daily as needed for cough.  . LORMarland Kitchenzepam (ATIVAN) 0.5 MG tablet Take 1 tablet (0.5 mg total) by mouth at  bedtime as needed for anxiety.  . acyMarland Kitchenlovir (ZOVIRAX) 400 MG tablet TAKE 1 TABLET BY MOUTH TWICE A DAY  . albuterol (VENTOLIN HFA) 108 (90 Base) MCG/ACT inhaler Inhale 2 puffs into the lungs every 6 (six) hours as needed for wheezing or shortness of breath.  . ateMarland Kitchenolol (TENORMIN) 50 MG tablet Take 1 tablet (50 mg total) by mouth 2 (two) times daily.  . atoMarland Kitchenvastatin (LIPITOR) 20 MG tablet Take 1 tablet (20 mg total) by mouth daily at 6 PM.  . blood glucose meter kit and supplies Dispense based on patient and insurance preference. Use up to test glucose once daily. For ICD -10 E11.9.  . EPINEPHrine 0.3 mg/0.3 mL IJ SOAJ injection Inject 0.3 mg into the muscle as needed for anaphylaxis.   . escMarland Kitchentalopram (LEXAPRO) 20 MG tablet Take 1 tablet (20 mg total) by mouth daily.  . Lancets (ONETOUCH DELICA PLUS [id number] USE TO TEST BLOOD GLUCOSE DAILY  . Melatonin 3 MG CAPS Take 3 mg by mouth at bedtime.  . metFORMIN (GLUCOPHAGE-XR) 500 MG 24 hr tablet TAKE 1 TABLET BY MOUTH BID WITH meals  . ONE TOUCH ULTRA TEST test strip USE TO TEST BLOOD GLUCOSE DAILY  . pantoprazole (PROTONIX) 40 MG tablet Take 1 tablet (40 mg total) by mouth daily before breakfast.  . [DISCONTINUED] albuterol (VENTOLIN HFA) 108 (90 Base) MCG/ACT inhaler Inhale 2 puffs into the lungs every 6 (six) hours as needed for wheezing or shortness of breath.  . [DISCONTINUED] benzonatate (TESSALON) 100 MG capsule Take 1 capsule (100 mg  total) by mouth 2 (two) times daily as needed for cough.  . [DISCONTINUED] LORazepam (ATIVAN) 2 MG tablet TAKE ONE TABLET BY MOUTH UP TO TWICE A DAY AS NEEDED FOR ANXIETY STOP XANAX  . [DISCONTINUED] metFORMIN (GLUCOPHAGE-XR) 500 MG 24 hr tablet TAKE 1 TABLET BY MOUTH EVERY DAY WITH BREAKFAST  . [DISCONTINUED] potassium chloride SA (K-DUR,KLOR-CON) 20 MEQ tablet Take 1 tablet (20 mEq total) by mouth 2 (two) times daily.  . [DISCONTINUED] predniSONE (DELTASONE) 10 MG tablet Take 1 tablet (10 mg total) by mouth  daily with breakfast.   No facility-administered encounter medications on file as of 07/31/2020.     Review of Systems  Constitutional: Negative for chills and fever.  HENT: Negative for congestion, rhinorrhea and sore throat.   Respiratory: Negative for cough, shortness of breath and wheezing.   Cardiovascular: Negative for chest [name] and leg swelling.  Gastrointestinal: Negative for abdominal [name], diarrhea, nausea and vomiting.  Genitourinary: Negative for dysuria and frequency.  Musculoskeletal: Negative for arthralgias and back [name].  Skin: Negative for rash.  Neurological: Negative for dizziness, weakness and headaches.  Psychiatric/Behavioral: Negative for dysphoric mood, self-injury, sleep disturbance and suicidal ideas. The patient is not nervous/anxious.      Vitals BP 108/68   Pulse 85   Temp 97.7 F (36.5 C)   Wt 178 lb 9.6 oz (81 kg)   LMP 12/04/2008   SpO2 94%   BMI 33.75 kg/m   Objective:   Physical Exam Vitals and nursing note reviewed.  Constitutional:      Appearance: Normal appearance.  HENT:     Head: Normocephalic and atraumatic.     Nose: Nose normal.     Mouth/Throat:     Mouth: Mucous membranes are moist.     Pharynx: Oropharynx is clear.  Eyes:     Extraocular Movements: Extraocular movements intact.     Conjunctiva/sclera: Conjunctivae normal.     Pupils: Pupils are equal, round, and reactive to light.  Cardiovascular:     Rate and Rhythm: Normal rate and regular rhythm.     Pulses: Normal pulses.     Heart sounds: Normal heart sounds.  Pulmonary:     Effort: Pulmonary effort is normal.     Breath sounds: Normal breath sounds. No wheezing, rhonchi or rales.  Musculoskeletal:        General: Normal range of motion.     Right lower leg: No edema.     Left lower leg: No edema.  Skin:    General: Skin is warm and dry.     Findings: No lesion or rash.  Neurological:     General: No focal deficit present.     Mental Status: She is  alert and oriented to person, place, and time.  Psychiatric:        Mood and Affect: Mood normal.        Behavior: Behavior normal.      Assessment and Plan   1. Type 2 diabetes mellitus without complication, without long-term current use of insulin (HCC) - CBC - CMP14+EGFR - Hemoglobin A1c - Lipid panel - Microalbumin, urine  2. Acute bronchitis due to COVID-19 virus - albuterol (VENTOLIN HFA) 108 (90 Base) MCG/ACT inhaler; Inhale 2 puffs into the lungs every 6 (six) hours as needed for wheezing or shortness of breath.  Dispense: 18 g; Refill: 0  3. Hyperlipidemia associated with type 2 diabetes mellitus ([place])  4. Anxiety  5. Chronic depression    Bronchitis- will refill the  albuterol.  Pt improving from covid. Cont to use inhaler daily for coughing.  hld- stable. Cont meds.  DM2- stable. Cont meds.  Anxiety/depression- stable. Cont lexapro 108m and dec ativan from 287mbid to 0.91m70mid. Pt to take 1/2 of the 2mg7mrazepam bid till she runs out then pt can start the 0.91mg 41mvan bid.  Reviewed with pt that I normally don't prescribe high doses of benzodiazepines for long periods of time.  Reviewed risk vs benefits of being on on benzodiazepines.  Pt voiced understanding.  F/u 6 mo or prn.

## 2020-07-31 NOTE — Patient Instructions (Signed)
Cut the 2mg  lorazepam in 1/2 to take at night till you run out,  then start taking the 0.5mg  lorazepam at night.

## 2020-08-01 LAB — HEMOGLOBIN A1C
Est. average glucose Bld gHb Est-mCnc: 134 mg/dL
Hgb A1c MFr Bld: 6.3 % — ABNORMAL HIGH (ref 4.8–5.6)

## 2020-08-01 LAB — LIPID PANEL
Chol/HDL Ratio: 3 ratio (ref 0.0–4.4)
Cholesterol, Total: 180 mg/dL (ref 100–199)
HDL: 61 mg/dL (ref >39)
LDL Chol Calc (NIH): 90 mg/dL (ref 0–99)
Triglycerides: 173 mg/dL — ABNORMAL HIGH (ref 0–149)
VLDL Cholesterol Cal: 29 mg/dL (ref 5–40)

## 2020-08-01 LAB — CBC
Hematocrit: 37.8 % (ref 34.0–46.6)
Hemoglobin: 12.6 g/dL (ref 11.1–15.9)
MCH: 26.8 pg (ref 26.6–33.0)
MCHC: 33.3 g/dL (ref 31.5–35.7)
MCV: 80 fL (ref 79–97)
Platelets: 235 10*3/uL (ref 150–450)
RBC: 4.7 x10E6/uL (ref 3.77–5.28)
RDW: 14.5 % (ref 11.7–15.4)
WBC: 9.1 10*3/uL (ref 3.4–10.8)

## 2020-08-01 LAB — CMP14+EGFR
ALT: 29 IU/L (ref 0–32)
AST: 18 IU/L (ref 0–40)
Albumin/Globulin Ratio: 1.5 (ref 1.2–2.2)
Albumin: 4.2 g/dL (ref 3.8–4.9)
Alkaline Phosphatase: 71 IU/L (ref 44–121)
BUN/Creatinine Ratio: 11 (ref 9–23)
BUN: 10 mg/dL (ref 6–24)
Bilirubin Total: 0.3 mg/dL (ref 0.0–1.2)
CO2: 28 mmol/L (ref 20–29)
Calcium: 9.9 mg/dL (ref 8.7–10.2)
Chloride: 104 mmol/L (ref 96–106)
Creatinine, Ser: 0.89 mg/dL (ref 0.57–1.00)
GFR calc Af Amer: 82 mL/min/{1.73_m2} (ref >59)
GFR calc non Af Amer: 71 mL/min/{1.73_m2} (ref >59)
Globulin, Total: 2.8 g/dL (ref 1.5–4.5)
Glucose: 111 mg/dL — ABNORMAL HIGH (ref 65–99)
Potassium: 4 mmol/L (ref 3.5–5.2)
Sodium: 144 mmol/L (ref 134–144)
Total Protein: 7 g/dL (ref 6.0–8.5)

## 2020-08-01 LAB — MICROALBUMIN, URINE: Microalbumin, Urine: 9 ug/mL

## 2020-08-19 ENCOUNTER — Other Ambulatory Visit: Payer: Self-pay | Admitting: Family Medicine

## 2020-08-19 DIAGNOSIS — J208 Acute bronchitis due to other specified organisms: Secondary | ICD-10-CM

## 2020-08-20 NOTE — Telephone Encounter (Signed)
Pls call pt to see how her breathing is going? Got request for another inhaler, just filled 2 wks ago.  How many times per day is she using inhaler?  Thx.   Dr. Lovena Le

## 2020-08-21 NOTE — Telephone Encounter (Signed)
Pt contacted. Pt states she does not need a refill. Breathing is doing better.

## 2020-08-23 ENCOUNTER — Encounter: Payer: Self-pay | Admitting: Nurse Practitioner

## 2020-08-23 ENCOUNTER — Telehealth: Payer: Self-pay

## 2020-08-23 ENCOUNTER — Other Ambulatory Visit: Payer: Self-pay

## 2020-08-23 ENCOUNTER — Ambulatory Visit (INDEPENDENT_AMBULATORY_CARE_PROVIDER_SITE_OTHER): Payer: Medicare HMO | Admitting: Nurse Practitioner

## 2020-08-23 VITALS — BP 126/84 | HR 80 | Temp 97.8°F | Ht 61.0 in | Wt 175.0 lb

## 2020-08-23 DIAGNOSIS — E119 Type 2 diabetes mellitus without complications: Secondary | ICD-10-CM

## 2020-08-23 DIAGNOSIS — F419 Anxiety disorder, unspecified: Secondary | ICD-10-CM

## 2020-08-23 DIAGNOSIS — R69 Illness, unspecified: Secondary | ICD-10-CM | POA: Diagnosis not present

## 2020-08-23 DIAGNOSIS — F5104 Psychophysiologic insomnia: Secondary | ICD-10-CM | POA: Insufficient documentation

## 2020-08-23 DIAGNOSIS — E161 Other hypoglycemia: Secondary | ICD-10-CM | POA: Diagnosis not present

## 2020-08-23 MED ORDER — TRAZODONE HCL 50 MG PO TABS: 25.0000 mg | ORAL_TABLET | Freq: Every evening | ORAL | 2 refills | Status: DC | PRN

## 2020-08-23 NOTE — Telephone Encounter (Signed)
Pt seen [name] [name] today and forgot to ask can she write a RX for new diabetic machine the one that she has on her arm is given her different than the one on the machine.  Pt call back [phone]

## 2020-08-23 NOTE — Progress Notes (Addendum)
Subjective:    Patient ID: [name] [name], female    DOB: 03/01/1961, 60 y.o.   MRN: [id number]  HPI  Patient presents today with c/o headaches and hot flashes with sweats.  Pt has hx of breast ca diagnosed in 2014, treatment completed in 2019 and following with oncology annually.  Abdominal hysterectomy in 2008.  States she has been having these issues for a couple of months, along with some lightheadedness and irritability.  The headaches do not seem to get better with anything that she does or medications she takes, but come and go with a bit of a dull ache. She reports her blood sugars have been running high, in the upper 100s and lower 200s when she checks them at home, but then also seem to drop to 60 sometimes and she feels jittery and 'terrible' and she has to force herself to drink some OJ or eat a snack to bring the sugar back up.  Reports Dr. Lovena Le has increased her Metformin 500 mg ER tablet from once daily to twice a day but that she 'feels terrible' if she takes two tablets so has only been taking it once a day.  She does not eat until approximately 1pm and then eats a very small meal for dinner, usually with a side of fruit.   Also c/o some insomnia and anxiety, partially related to caring for foster children in her home.  She is taking 2 tylenol PM and 3 mg of melatonin to sleep at bedtime but reports this makes her feel slightly groggy in the mornings.  She has tried just the melatonin or also 1 tylenol PM but neither seem to work unless she takes the 2 of tylenol PM with the melatonin, reports 'just laying there forever to fall asleep and feels like my heart is fluttering just a little bit'. Has taken Trazodone in the past without difficulty.    Review of Systems  Constitutional: Negative for fatigue, fever and unexpected weight change.  Eyes: Negative for visual disturbance.       Pt reports she had an eye exam recently.  Respiratory: Negative for cough and shortness of breath.    Cardiovascular: Positive for palpitations. Negative for chest pain and leg swelling.       Pt reports mild temporary feeling of heart racing when she lays down at night. Unassociated with activity. Rare episode during the day only lasting a few seconds.   Gastrointestinal: Positive for nausea.  Endocrine: Positive for heat intolerance.  Genitourinary: Negative for vaginal bleeding.  Musculoskeletal: Negative for neck pain and neck stiffness.  Neurological: Positive for light-headedness and headaches. Negative for dizziness, syncope, facial asymmetry, speech difficulty, weakness and numbness.  Psychiatric/Behavioral: Positive for sleep disturbance. The patient is nervous/anxious.      GAD 7 : Generalized Anxiety Score 08/23/2020 08/01/2020 06/20/2018  Nervous, Anxious, on Edge 1 0 0  Control/stop worrying 0 1 1  Worry too much - different things 1 0 1  Trouble relaxing 1 1 1   Restless 0 1 0  Easily annoyed or irritable 0 0 0  Afraid - awful might happen 0 0 0  Total GAD 7 Score 3 3 3   Anxiety Difficulty Somewhat difficult Not difficult at all Not difficult at all    [facility] SCORE ONLY 08/23/2020 08/01/2020 02/09/2020  PHQ-9 Total Score 9 3 6       Objective:   Physical Exam Constitutional:      General: She is not in acute distress.  Appearance: Normal appearance.  Neck:     Comments: No nodules or thyromegaly noted on exam. Non tender to palpation. Cardiovascular:     Rate and Rhythm: Normal rate and regular rhythm.     Heart sounds: Normal heart sounds. No murmur heard.   Pulmonary:     Effort: Pulmonary effort is normal. No respiratory distress.     Breath sounds: Normal breath sounds.  Abdominal:     General: There is no distension.     Palpations: Abdomen is soft. There is no mass.  Musculoskeletal:     Cervical back: Neck supple.  Lymphadenopathy:     Cervical: No cervical adenopathy.  Skin:    General: Skin is warm and dry.  Neurological:     Mental Status: She is  alert and oriented to person, place, and time.  Psychiatric:        Mood and Affect: Mood normal.        Behavior: Behavior normal.        Thought Content: Thought content normal.        Judgment: Judgment normal.     Comments: Mildly anxious affect.      Vitals:   08/23/20 0902  BP: 126/84  Pulse: 80  Temp: 97.8 F (36.6 C)  SpO2: 98%         Assessment & Plan:   Problem List Items Addressed This Visit      Digestive   Reactive hypoglycemia - Primary   Relevant Orders   Ambulatory referral to diabetic education     Endocrine   [facility])   Relevant Orders   Ambulatory referral to diabetic education     Other   Anxiety   Relevant Medications   traZODone (DESYREL) 50 MG tablet   Psychophysiological insomnia      Meds ordered this encounter  Medications  . traZODone (DESYREL) 50 MG tablet    Sig: Take 0.5-1 tablets (25-50 mg total) by mouth at bedtime as needed for sleep.    Dispense:  30 tablet    Refill:  2    Order Specific Question:   Supervising Provider    Answer:   Kathyrn Drown [[id number]]    Patient given Freestyle Libre sample (placed in office on L upper arm) to assist with monitoring blood sugars along with education to document symptoms corresponding with blood sugar measurements.  Referral to diabetes dietician also given to assist with meal planning.  Patient to continue with 1 metformin tablet at this time.   Defers changes in her anxiety medications at this time.  Patient has tried trazadone for sleep in the past and would like to try this medication again.  Instructions given to stop tylenol PM and melatonin if using the trazadone HS.   Return in about 1 month (around 09/20/2020).  30 minutes was spent with the patient including previsit chart review, time spent with patient, discussion of health issues, review of data including medical record, and documentation of the visit.

## 2020-08-23 NOTE — Patient Instructions (Signed)
Stop tylenol PM and melatonin, and start the trazadone to help with sleep.  Check blood sugars as symptoms persist.  Referral to diabetic dietician.

## 2020-08-23 NOTE — Progress Notes (Signed)
   Subjective:    Patient ID: [name] [name], female    DOB: Mar 11, 1961, 60 y.o.   MRN: [id number]  HPIwould like to discuss menopause and headaches.   Headaches for about 2 months. Tried tylenol. Sometimes help but most of the time it does not help.  Trouble sleeping for the past month due to anxiety.   Menopause. Constantly burning up all the time.     Review of Systems     Objective:   Physical Exam        Assessment & Plan:

## 2020-08-24 ENCOUNTER — Encounter: Payer: Self-pay | Admitting: Nurse Practitioner

## 2020-08-26 ENCOUNTER — Other Ambulatory Visit: Payer: Self-pay

## 2020-08-27 ENCOUNTER — Other Ambulatory Visit: Payer: Self-pay

## 2020-08-28 NOTE — Telephone Encounter (Signed)
Pt states she wants the one she sticks her finger with because the sensor fell off and her insurance was not covering it anyway. It was $130 every 2 weeks.  [facility].

## 2020-08-28 NOTE — Telephone Encounter (Signed)
Please clarify. Does she want me to send in an order for [facility] sensor for her arm? Thanks.

## 2020-08-30 ENCOUNTER — Other Ambulatory Visit: Payer: Self-pay | Admitting: Nurse Practitioner

## 2020-08-30 MED ORDER — BLOOD GLUCOSE MONITOR KIT: PACK | 11 refills | Status: DC

## 2020-08-30 NOTE — Telephone Encounter (Signed)
Pt contacted and verbalized understanding.  

## 2020-08-30 NOTE — Telephone Encounter (Signed)
Just sent in order for supplies and machine. She should be able to choose the monitor covered by her insurance.

## 2020-09-12 ENCOUNTER — Telehealth: Payer: Self-pay

## 2020-09-12 NOTE — Telephone Encounter (Signed)
Pt needs refill on acyclovir (ZOVIRAX) 400 MG tablet  [id number] - [place], [place] - [address] pt is out of Med she hjas a 1 month follow up with [facility] 03/18  Pt call back [phone]

## 2020-09-13 ENCOUNTER — Other Ambulatory Visit: Payer: Self-pay | Admitting: Family Medicine

## 2020-09-13 ENCOUNTER — Other Ambulatory Visit: Payer: Self-pay | Admitting: Nurse Practitioner

## 2020-09-13 MED ORDER — ACYCLOVIR 400 MG PO TABS: 400.0000 mg | ORAL_TABLET | Freq: Two times a day (BID) | ORAL | 0 refills | Status: DC

## 2020-09-13 NOTE — Telephone Encounter (Signed)
Done

## 2020-09-20 ENCOUNTER — Other Ambulatory Visit: Payer: Self-pay

## 2020-09-20 ENCOUNTER — Encounter: Payer: Self-pay | Admitting: Nurse Practitioner

## 2020-09-20 ENCOUNTER — Ambulatory Visit (INDEPENDENT_AMBULATORY_CARE_PROVIDER_SITE_OTHER): Payer: Medicare HMO | Admitting: Nurse Practitioner

## 2020-09-20 VITALS — BP 137/75 | HR 61 | Temp 97.8°F | Wt 175.0 lb

## 2020-09-20 DIAGNOSIS — E161 Other hypoglycemia: Secondary | ICD-10-CM | POA: Diagnosis not present

## 2020-09-20 DIAGNOSIS — K219 Gastro-esophageal reflux disease without esophagitis: Secondary | ICD-10-CM | POA: Diagnosis not present

## 2020-09-20 MED ORDER — SUCRALFATE 1 G PO TABS: 1.0000 g | ORAL_TABLET | Freq: Three times a day (TID) | ORAL | 0 refills | Status: DC

## 2020-09-20 NOTE — Progress Notes (Signed)
Subjective:    Patient ID: [name] [name], female    DOB: 03-26-1961, 60 y.o.   MRN: [id number]  HPI   Pt here for one month follow up. Pt was seen on 08/23/20. Pt states Trazodone is not working; it cause headaches. States that she does take melatonin and its been helping sleep.   Patient was given a CBG sensor last visit but this came off. Has recorded glucose 3 times a week with her regular monior. States the sugar was up (high of 180's) and down (low of 60's). Unsure if associated with any particular foods. Has an appointment with dietician.   Also here with complains of abdominal bloating, and worsening heart burn. Protonix is no longer working, sticky BM and does have a BM every there day. Denies bloody stools, melena, or hematemesis.  Has been having GERD for the past 15-20 years and taking Protonix for it. Has been seen by [facility] in the past. Denies taking NSAIDS, does not consume a lot of spicy food, drinks 2 cups of coffee daily, sometimes eats pasta with tomato sauce. No other caffeine.  Often skips breakfast, and eats late. Patient states when she finally eats she feels light headed shortly after like her sugar dropped.  Last seen on 04/21/17 by Dr. [name], GI specialist.     Review of Systems  Constitutional: Negative for activity change, appetite change, chills, fatigue, fever and unexpected weight change.  Respiratory: Negative for cough, chest tightness, shortness of breath and wheezing.   Cardiovascular: Negative for chest pain, palpitations and leg swelling.  Gastrointestinal: Positive for abdominal pain and nausea. Negative for abdominal distention, anal bleeding, blood in stool, constipation, diarrhea and vomiting.       Abdominal bloating , feeling like vomiting but never vomits   Genitourinary: Negative for difficulty urinating, dysuria and frequency.        Objective:   Physical Exam Constitutional:      General: She is not in acute distress.     Appearance: Normal appearance.  Cardiovascular:     Rate and Rhythm: Normal rate and regular rhythm.     Heart sounds: Normal heart sounds.  Pulmonary:     Effort: Pulmonary effort is normal.     Breath sounds: Normal breath sounds.  Abdominal:     Palpations: There is no mass.     Tenderness: There is abdominal tenderness. There is guarding. There is no rebound.     Hernia: No hernia is present.     Comments: Mild generalized lower abdominal pain. Distinct epigastric area tenderness even with light palpation. Guarding noted.   Neurological:     Mental Status: She is alert and oriented to person, place, and time.  Psychiatric:        Mood and Affect: Mood normal.        Behavior: Behavior normal.        Thought Content: Thought content normal.        Judgment: Judgment normal.     Today's Vitals   09/20/20 1102  BP: 137/75  Pulse: 61  Temp: 97.8 F (36.6 C)  SpO2: 98%  Weight: 175 lb (79.4 kg)   Body mass index is 33.07 kg/m. CBC on 07/31/20: Hgb 12.6.     Assessment & Plan:   Problem List Items Addressed This Visit      Digestive   GERD (gastroesophageal reflux disease) - Primary   Relevant Medications   sucralfate (CARAFATE) 1 g tablet   Other Relevant Orders  Ambulatory referral to Gastroenterology   Reactive hypoglycemia     Meds ordered this encounter  Medications  . sucralfate (CARAFATE) 1 g tablet    Sig: Take 1 tablet (1 g total) by mouth 4 (four) times daily -  with meals and at bedtime. As needed for acid reflux    Dispense:  120 tablet    Refill:  0    Please advise pt not to take with her regular medications    Order Specific Question:   Supervising Provider    Answer:   Kathyrn Drown [[id number]]   Plan/education  Patient was referred back to [facility].  Patient has a dietitian appointment March 31st  Reviewed types of foods that will worsen GERD such as tomato sauce, coffee, tea, citrus, spicy foods, greasy foods or fried foods.   Avoid  NSAIDS and energy drinks.  Encouraged patient to check blood sugar at different times and make notes about any specific foods that affect her BS.    Recheck here as needed.

## 2020-09-20 NOTE — Patient Instructions (Signed)
Increase pantoprazole (Protonix) to twice daily, once in the morning in the am and pm.

## 2020-09-20 NOTE — Progress Notes (Signed)
   Subjective:    Patient ID: [name] [name], female    DOB: 29-May-1961, 60 y.o.   MRN: [id number]  HPI Pt here for one month follow up. Pt was seen on 08/23/20. Pt states Trazodone is not working; gives patient headache. Sugars are still up and down.    Review of Systems     Objective:   Physical Exam        Assessment & Plan:

## 2020-09-21 ENCOUNTER — Encounter: Payer: Self-pay | Admitting: Nurse Practitioner

## 2020-09-25 ENCOUNTER — Encounter: Payer: Self-pay | Admitting: Nurse Practitioner

## 2020-10-03 ENCOUNTER — Encounter: Payer: Medicare HMO | Attending: Nurse Practitioner | Admitting: Nutrition

## 2020-10-03 ENCOUNTER — Encounter: Payer: Self-pay | Admitting: Nutrition

## 2020-10-03 ENCOUNTER — Other Ambulatory Visit: Payer: Self-pay | Admitting: Family Medicine

## 2020-10-03 ENCOUNTER — Other Ambulatory Visit: Payer: Self-pay

## 2020-10-03 ENCOUNTER — Other Ambulatory Visit: Payer: Self-pay | Admitting: Nurse Practitioner

## 2020-10-03 VITALS — Ht 61.0 in | Wt 174.4 lb

## 2020-10-03 DIAGNOSIS — E1165 Type 2 diabetes mellitus with hyperglycemia: Secondary | ICD-10-CM | POA: Insufficient documentation

## 2020-10-03 DIAGNOSIS — E7800 Pure hypercholesterolemia, unspecified: Secondary | ICD-10-CM | POA: Diagnosis not present

## 2020-10-03 DIAGNOSIS — IMO0002 Reserved for concepts with insufficient information to code with codable children: Secondary | ICD-10-CM

## 2020-10-03 DIAGNOSIS — E118 Type 2 diabetes mellitus with unspecified complications: Secondary | ICD-10-CM | POA: Insufficient documentation

## 2020-10-03 DIAGNOSIS — E1169 Type 2 diabetes mellitus with other specified complication: Secondary | ICD-10-CM

## 2020-10-03 DIAGNOSIS — E785 Hyperlipidemia, unspecified: Secondary | ICD-10-CM | POA: Insufficient documentation

## 2020-10-03 DIAGNOSIS — E669 Obesity, unspecified: Secondary | ICD-10-CM

## 2020-10-03 DIAGNOSIS — I10 Essential (primary) hypertension: Secondary | ICD-10-CM

## 2020-10-03 NOTE — Progress Notes (Signed)
Medical Nutrition Therapy  Appointment Start time:  0800  Appointment End time:  0930  Primary concerns today: DM Type 2, Obesity, Referral diagnosis: E11.8 Preferred learning style:  no preference indicated Learning readiness: change in progress  NUTRITION ASSESSMENT   Anthropometrics  Wt Readings from Last 3 Encounters:  10/03/20 174 lb 6.4 oz (79.1 kg)  09/20/20 175 lb (79.4 kg)  08/23/20 175 lb (79.4 kg)   Ht Readings from Last 3 Encounters:  10/03/20 5\' 1"  (1.549 m)  08/23/20 5\' 1"  (1.549 m)  02/09/20 5\' 1"  (1.549 m)   Body mass index is 32.95 kg/m. @BMIFA @ Facility age limit for growth percentiles is 20 years. Facility age limit for growth percentiles is 20 years.   Daughter lives with her and grandson of   5 yrs old lives with her. 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 Has a lot of family and grandkids she is caring for and has lost focus on taking care of herself. Usually just eats 1 meal per day in evening. Admits to drinking tea and eating random foods.   She wants to get her DM under control, get off medications and lose weight.  Clinical Medical Hx: HTN and Hyperlipidemia. Medications: Metformin 500 mg once a day Labs:  Lab Results  Component Value Date   HGBA1C 6.3 (H) 07/31/2020   CMP Latest Ref Rng & Units 07/31/2020 11/07/2019 08/31/2019  Glucose 65 - 99 mg/dL 111(H) 88 85  BUN 6 - 24 mg/dL 10 8 10   Creatinine 0.57 - 1.00 mg/dL 0.89 0.94 1.00  Sodium 134 - 144 mmol/L 144 145(H) 143  Potassium 3.5 - 5.2 mmol/L 4.0 4.1 4.3  Chloride 96 - 106 mmol/L 104 106 106  CO2 20 - 29 mmol/L 28 25 25   Calcium 8.7 - 10.2 mg/dL 9.9 9.3 9.3  Total Protein 6.0 - 8.5 g/dL 7.0 6.7 -  Total Bilirubin 0.0 - 1.2 mg/dL 0.3 0.3 -  Alkaline Phos 44 - 121 IU/L 71 77 -  AST 0 - 40 IU/L 18 17 -  ALT 0 - 32 IU/L 29 18 -   Lipid Panel     Component Value Date/Time   CHOL 180 07/31/2020 1432   TRIG 173 (H) 07/31/2020 1432   HDL 61 07/31/2020 1432   CHOLHDL 3.0 07/31/2020 1432   CHOLHDL  3.6 02/09/2014 1323   VLDL 15 02/09/2014 1323   LDLCALC 90 07/31/2020 1432   LABVLDL 29 07/31/2020 1432    Notable Signs/Symptoms: none  Lifestyle & Dietary Hx Married with her husband and children  Estimated daily fluid intake:  oz Supplements:  Sleep: 6 hrs Stress / self-care:  Current average weekly physical activity: ADL  24-Hr Dietary Recall First Meal: Skips Snack: 2 cups coffee-equal Second Meal:  Snack: water Third Meal: Chicken alfredo, Sweet tea Snack:  Beverages: coffee, sweet tea 1 glass per day, water- 64 oz  Estimated Energy Needs Calories: 1200 Carbohydrate: 135g Protein: 90g Fat: 33g   NUTRITION DIAGNOSIS  NB-1.1 Food and nutrition-related knowledge deficit As related to Diabetes Type 2.  As evidenced by A1C 6.6%.Marland Kitchen   NUTRITION INTERVENTION  Nutrition education (E-1) on the following topics:  . Nutrition and Diabetes education provided on My Plate, CHO counting, meal planning, portion sizes, timing of meals, avoiding snacks between meals unless having a low blood sugar, target ranges for A1C and blood sugars, signs/symptoms and treatment of hyper/hypoglycemia, monitoring blood sugars, taking medications as prescribed, benefits of exercising 30 minutes per day and prevention of complications  of DM. .   Handouts Provided Include   The Plate Method   Meal Plan Card  Diabetes Instructions   Learning Style & Readiness for Change Teaching method utilized: Visual & Auditory  Demonstrated degree of understanding via: Teach Back  Barriers to learning/adherence to lifestyle change: none  Goals Established by Pt . Work on planning meals better and eating meals on time. Goals  Follow My Plate Eat meals on time Eat 2 carb choices per meals Get A1C down to 5.7%. Exercise by going to [facility] 3 times per day. Lose 1 lb per week  MONITORING & EVALUATION Dietary intake, weekly physical activity, and blood sugars  in 1 month.  Next Steps  Patient is to  work on meal planning.Marland Kitchen

## 2020-10-03 NOTE — Patient Instructions (Addendum)
Goals  Follow My Plate Eat meals on time Eat 2 carb choices per meals Get A1C down to 5.7%. Exercise by going to [facility] 3 times per day. Lose 1 lb per week

## 2020-10-08 ENCOUNTER — Other Ambulatory Visit: Payer: Self-pay | Admitting: Family Medicine

## 2020-10-15 ENCOUNTER — Other Ambulatory Visit: Payer: Self-pay | Admitting: Nurse Practitioner

## 2020-10-24 ENCOUNTER — Other Ambulatory Visit: Payer: Self-pay | Admitting: Nurse Practitioner

## 2020-10-25 ENCOUNTER — Other Ambulatory Visit: Payer: Self-pay | Admitting: Family Medicine

## 2020-10-29 ENCOUNTER — Other Ambulatory Visit: Payer: Self-pay | Admitting: Family Medicine

## 2020-10-30 ENCOUNTER — Other Ambulatory Visit: Payer: Self-pay | Admitting: Family Medicine

## 2020-11-03 NOTE — Telephone Encounter (Signed)
Pt needs follow up on anxiety for refills on ativan in the next 30 days.  Thx.   Dr. Lovena Le

## 2020-11-04 NOTE — Telephone Encounter (Signed)
Sent my chart message to schedule appointment.

## 2020-11-04 NOTE — Telephone Encounter (Signed)
Please contact patient to have her set up appt within next 30 days. Thank you

## 2020-11-05 ENCOUNTER — Ambulatory Visit: Payer: Self-pay | Admitting: Nurse Practitioner

## 2020-11-06 ENCOUNTER — Ambulatory Visit: Payer: Medicare HMO | Admitting: Nutrition

## 2020-11-07 ENCOUNTER — Other Ambulatory Visit: Payer: Self-pay | Admitting: Family Medicine

## 2020-11-21 DIAGNOSIS — T783XXD Angioneurotic edema, subsequent encounter: Secondary | ICD-10-CM | POA: Diagnosis not present

## 2020-11-21 DIAGNOSIS — J3081 Allergic rhinitis due to animal (cat) (dog) hair and dander: Secondary | ICD-10-CM | POA: Diagnosis not present

## 2020-11-21 DIAGNOSIS — J301 Allergic rhinitis due to pollen: Secondary | ICD-10-CM | POA: Diagnosis not present

## 2020-11-21 DIAGNOSIS — J3089 Other allergic rhinitis: Secondary | ICD-10-CM | POA: Diagnosis not present

## 2020-11-26 ENCOUNTER — Other Ambulatory Visit: Payer: Self-pay | Admitting: Family Medicine

## 2020-11-27 ENCOUNTER — Telehealth: Payer: Self-pay | Admitting: Adult Health

## 2020-11-27 NOTE — Telephone Encounter (Signed)
Rescheduled appointment per provider. Left a detailed message.  

## 2020-12-02 ENCOUNTER — Other Ambulatory Visit: Payer: Self-pay | Admitting: Family Medicine

## 2020-12-03 NOTE — Telephone Encounter (Signed)
Last seen by dr Lovena Le 07/31/20. Saw carolyn in march

## 2020-12-05 ENCOUNTER — Other Ambulatory Visit: Payer: Self-pay | Admitting: Family Medicine

## 2020-12-06 ENCOUNTER — Encounter: Payer: Self-pay | Admitting: Nurse Practitioner

## 2020-12-06 ENCOUNTER — Ambulatory Visit (INDEPENDENT_AMBULATORY_CARE_PROVIDER_SITE_OTHER): Payer: Medicare HMO | Admitting: Nurse Practitioner

## 2020-12-06 ENCOUNTER — Other Ambulatory Visit: Payer: Self-pay

## 2020-12-06 VITALS — BP 138/88 | HR 63 | Temp 95.4°F | Wt 173.0 lb

## 2020-12-06 DIAGNOSIS — R0789 Other chest pain: Secondary | ICD-10-CM

## 2020-12-06 DIAGNOSIS — Z853 Personal history of malignant neoplasm of breast: Secondary | ICD-10-CM | POA: Diagnosis not present

## 2020-12-06 DIAGNOSIS — N644 Mastodynia: Secondary | ICD-10-CM | POA: Diagnosis not present

## 2020-12-06 DIAGNOSIS — R591 Generalized enlarged lymph nodes: Secondary | ICD-10-CM

## 2020-12-06 MED ORDER — ACYCLOVIR 400 MG PO TABS: 400.0000 mg | ORAL_TABLET | Freq: Two times a day (BID) | ORAL | 1 refills | Status: DC

## 2020-12-06 NOTE — Progress Notes (Signed)
   Subjective:    Patient ID: [name] [name], female    DOB: 10-07-1960, 60 y.o.   MRN: [id number]  HPI: Presents for complaints of small lumps in the left upper anterior chest wall beneath the left clavicle that she has noticed for about 2 months.  Minimal tenderness to this area but has brief dull achiness in the left axilla at times.  Patient has a history of breast cancer on the left side 2015 including chemotherapy and radiation.  Has her regular yearly follow-up with oncology in July.  Has a remote history of smoking.  No persistent fevers or unexplained weight loss.  No cough or shortness of breath.       Objective:   Physical Exam NAD.  Alert, oriented.  Lungs clear.  Heart regular rate and rhythm.  Several small soft cystic lesions noted approximately 2 cm below the mid left clavicular line.  No supraclavicular lymph nodes noted.  Left breast has dense tissue, no obvious masses.  Left mid axillary area no masses but very tender to palpation.  No lymphadenopathy noted.  Surgical incision noted along the lateral left breast from her previous lumpectomy.  Tenderness also along the left lateral breast area. Today's Vitals   12/06/20 1440  BP: 138/88  Pulse: 63  Temp: (!) 95.4 F (35.2 C)  SpO2: 99%  Weight: 173 lb (78.5 kg)   Body mass index is 32.69 kg/m.        Assessment & Plan:  Lymphadenopathy - Plan: DG Chest 2 View, CBC with Differential  Left-sided chest wall pain - Plan: DG Chest 2 View, CBC with Differential  Breast pain, left  History of left breast cancer - Plan: Ambulatory referral to Hematology / Oncology  CBC and chest x-ray pending.  A note was sent into oncology to see if we can get her in earlier.  Also encourage patient to call to see if they have an earlier opening.  Question whether this is basic cysts, fatty tissue or lymphadenopathy. Recheck here as needed. Call back next week if she has not heard about the oncology appointment.

## 2020-12-07 ENCOUNTER — Encounter: Payer: Self-pay | Admitting: Nurse Practitioner

## 2020-12-07 LAB — CBC WITH DIFFERENTIAL/PLATELET
Basophils Absolute: 0 10*3/uL (ref 0.0–0.2)
Basos: 0 %
EOS (ABSOLUTE): 0.1 10*3/uL (ref 0.0–0.4)
Eos: 1 %
Hematocrit: 39.2 % (ref 34.0–46.6)
Hemoglobin: 12.5 g/dL (ref 11.1–15.9)
Immature Grans (Abs): 0 10*3/uL (ref 0.0–0.1)
Immature Granulocytes: 0 %
Lymphocytes Absolute: 2.8 10*3/uL (ref 0.7–3.1)
Lymphs: 31 %
MCH: 26.4 pg — ABNORMAL LOW (ref 26.6–33.0)
MCHC: 31.9 g/dL (ref 31.5–35.7)
MCV: 83 fL (ref 79–97)
Monocytes Absolute: 0.7 10*3/uL (ref 0.1–0.9)
Monocytes: 8 %
Neutrophils Absolute: 5.4 10*3/uL (ref 1.4–7.0)
Neutrophils: 60 %
Platelets: 238 10*3/uL (ref 150–450)
RBC: 4.73 x10E6/uL (ref 3.77–5.28)
RDW: 14.1 % (ref 11.7–15.4)
WBC: 9.1 10*3/uL (ref 3.4–10.8)

## 2020-12-09 ENCOUNTER — Ambulatory Visit (HOSPITAL_COMMUNITY)
Admission: RE | Admit: 2020-12-09 | Discharge: 2020-12-09 | Disposition: A | Discharge status: Home / Self Care | Payer: Medicare HMO | Source: Ambulatory Visit | Attending: Nurse Practitioner | Admitting: Nurse Practitioner

## 2020-12-09 ENCOUNTER — Other Ambulatory Visit: Payer: Self-pay | Admitting: Family Medicine

## 2020-12-09 DIAGNOSIS — R591 Generalized enlarged lymph nodes: Secondary | ICD-10-CM | POA: Insufficient documentation

## 2020-12-09 DIAGNOSIS — R0789 Other chest pain: Secondary | ICD-10-CM | POA: Diagnosis not present

## 2020-12-09 DIAGNOSIS — R079 Chest pain, unspecified: Secondary | ICD-10-CM | POA: Diagnosis not present

## 2020-12-11 ENCOUNTER — Telehealth: Payer: Self-pay | Admitting: Adult Health

## 2020-12-11 NOTE — Telephone Encounter (Signed)
R.s per 6/8 sch msg, pt is aware

## 2020-12-16 ENCOUNTER — Encounter: Payer: Self-pay | Admitting: Adult Health

## 2020-12-16 ENCOUNTER — Telehealth: Payer: Self-pay

## 2020-12-16 ENCOUNTER — Other Ambulatory Visit: Payer: Self-pay

## 2020-12-16 ENCOUNTER — Inpatient Hospital Stay: Payer: Medicare HMO | Attending: Adult Health | Admitting: Adult Health

## 2020-12-16 VITALS — BP 124/58 | HR 65 | Temp 97.5°F | Resp 18 | Ht 61.0 in | Wt 172.8 lb

## 2020-12-16 DIAGNOSIS — C50412 Malignant neoplasm of upper-outer quadrant of left female breast: Secondary | ICD-10-CM | POA: Diagnosis not present

## 2020-12-16 DIAGNOSIS — Z853 Personal history of malignant neoplasm of breast: Secondary | ICD-10-CM | POA: Diagnosis not present

## 2020-12-16 DIAGNOSIS — R234 Changes in skin texture: Secondary | ICD-10-CM

## 2020-12-16 DIAGNOSIS — Z807 Family history of other malignant neoplasms of lymphoid, hematopoietic and related tissues: Secondary | ICD-10-CM | POA: Insufficient documentation

## 2020-12-16 DIAGNOSIS — Z923 Personal history of irradiation: Secondary | ICD-10-CM | POA: Diagnosis not present

## 2020-12-16 DIAGNOSIS — Z170 Estrogen receptor positive status [ER+]: Secondary | ICD-10-CM

## 2020-12-16 DIAGNOSIS — Z8249 Family history of ischemic heart disease and other diseases of the circulatory system: Secondary | ICD-10-CM | POA: Diagnosis not present

## 2020-12-16 DIAGNOSIS — Z8042 Family history of malignant neoplasm of prostate: Secondary | ICD-10-CM | POA: Diagnosis not present

## 2020-12-16 DIAGNOSIS — Z801 Family history of malignant neoplasm of trachea, bronchus and lung: Secondary | ICD-10-CM | POA: Diagnosis not present

## 2020-12-16 DIAGNOSIS — Z8049 Family history of malignant neoplasm of other genital organs: Secondary | ICD-10-CM | POA: Diagnosis not present

## 2020-12-16 DIAGNOSIS — Z7984 Long term (current) use of oral hypoglycemic drugs: Secondary | ICD-10-CM | POA: Insufficient documentation

## 2020-12-16 DIAGNOSIS — Z79899 Other long term (current) drug therapy: Secondary | ICD-10-CM | POA: Insufficient documentation

## 2020-12-16 DIAGNOSIS — E7800 Pure hypercholesterolemia, unspecified: Secondary | ICD-10-CM | POA: Insufficient documentation

## 2020-12-16 DIAGNOSIS — Z833 Family history of diabetes mellitus: Secondary | ICD-10-CM | POA: Diagnosis not present

## 2020-12-16 DIAGNOSIS — Z9221 Personal history of antineoplastic chemotherapy: Secondary | ICD-10-CM | POA: Insufficient documentation

## 2020-12-16 DIAGNOSIS — E119 Type 2 diabetes mellitus without complications: Secondary | ICD-10-CM | POA: Insufficient documentation

## 2020-12-16 DIAGNOSIS — Z90722 Acquired absence of ovaries, bilateral: Secondary | ICD-10-CM | POA: Insufficient documentation

## 2020-12-16 DIAGNOSIS — Z803 Family history of malignant neoplasm of breast: Secondary | ICD-10-CM | POA: Insufficient documentation

## 2020-12-16 DIAGNOSIS — M8580 Other specified disorders of bone density and structure, unspecified site: Secondary | ICD-10-CM | POA: Diagnosis not present

## 2020-12-16 DIAGNOSIS — Z87891 Personal history of nicotine dependence: Secondary | ICD-10-CM | POA: Insufficient documentation

## 2020-12-16 DIAGNOSIS — I10 Essential (primary) hypertension: Secondary | ICD-10-CM | POA: Insufficient documentation

## 2020-12-16 DIAGNOSIS — Z806 Family history of leukemia: Secondary | ICD-10-CM | POA: Diagnosis not present

## 2020-12-16 NOTE — Telephone Encounter (Signed)
This LPN called pt and left detailed message regarding appts for MM and [place] at [facility]. Pt is scheduled for 12/19/20 at 0830.

## 2020-12-16 NOTE — Progress Notes (Signed)
[facility]  [phone] [phone]) [phone]    ID: [name] [name] OB: [birth year]  MR#: [id number]  [id number]#:[id number]  Patient Care Team: Erven Colla, DO as PCP - General (Family Medicine) Magrinat, Virgie Dad, MD as Consulting Physician (Oncology) Nicholes Stairs, MD as Consulting Physician (Orthopedic Surgery) Nilda Simmer, NP (Family Medicine) Eloise Harman, DO as Consulting Physician (Gastroenterology) OTHER MD:   CHIEF COMPLAINT:  Left Breast Cancer  CURRENT TREATMENT: observation  BREAST CANCER HISTORY: From the original intake note 04/12/2013:  Gabriele palpated a mass in her left breast in late August 2014. She was already scheduled for screening mammography at [facility] and this was performed 03/13/2013. Indeed a possible mass was noted in the left breast and on 03/29/2013 the patient underwent diagnostic left mammography and left ultrasonography. This showed a 2.5 cm irregular mass in the upper outer quadrant of the left breast. This was palpable.by ultrasound there was a 1.6 cm irregular hypoechoic mass in the area in question, and in addition to enlarged left axillary lymph nodes were noted, the largest measuring 2.5 cm.  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  Bilateral breast MRI 04/11/2013 shows 3 abnormal enhancing masses in the upper outer quadrant of the left breast measuring altogether 6.4 cm. The largest separate mass measured 2.7 cm. There were abnormal or large lymph nodes in the left axilla. Ultrasound guided biopsy of the 2 smaller masses has been scheduled.  The  patient's subsequent history is as detailed below   INTERVAL HISTORY: Fabiana returns today for follow-up and treatment of her estrogen receptor positive breast cancer.  She continues on observation alone.  She is seeing her PCP regularly.    Sadira moved up her appt because she notes that a few weeks ago she felt some increased nodularity and lumpiness in her left upper breast.  Her PCP ordered a chest xray which was negative.  Adore's most recent mammogram and ultrasounds of her bilateral breasts were completed in 05/2020 and showed no evidence of malignancy and breast density category B.   She is enjoying her summer with her grandchildren and two foster children who just finished with third and fourth grade.  She has no new health changes, and remains active, but doesn't not intentionally exercise.  She otherwise is doing well today and has no concerns today.  REVIEW OF SYSTEMS:  Review of Systems  Constitutional:  Negative for appetite change, chills, fatigue, fever and unexpected weight change.  HENT:   Negative for hearing loss, lump/mass and trouble swallowing.   Eyes:  Negative for eye problems and icterus.  Respiratory:  Negative for chest tightness, cough and shortness of breath.   Cardiovascular:  Negative for chest pain, leg swelling and palpitations.  Gastrointestinal:  Negative for abdominal distention, abdominal pain, constipation, diarrhea, nausea and vomiting.  Endocrine: Negative for hot flashes.  Genitourinary:  Negative for difficulty urinating.   Musculoskeletal:  Negative for arthralgias.  Skin:  Negative for itching and rash.  Neurological:  Negative for dizziness, extremity weakness, headaches and numbness.  Hematological:  Negative for adenopathy. Does not bruise/bleed easily.  Psychiatric/Behavioral:  Negative for depression. The patient is not nervous/anxious.      PAST MEDICAL HISTORY: Past Medical History:  Diagnosis Date   Allergic rhinitis    Anemia,  unspecified 05/30/2013   Anxiety    Breast cancer ([place]) 03/31/13   left   Cancer ([place])    Chest pain 2009   Consultation-[facility], negative chest CT; nl echo in 2005; h/o palpitations   Chiari malformation type I ([place])    s/p suboccipital craniectomy, C1 laminectomy, superior C2 laminectomy, duraplasty 05/10/14   Colitis 2010   not IBD   Degenerative joint disease    + degenerative joint disease of the lumbosacral spine   Depression    Diabetes ([place])    Diabetes ([place])    Dysrhythmia    palpations   GERD (gastroesophageal reflux disease)    "a little"   Headache    Heart murmur    "small"   Herpes simplex type II infection    Hypercholesteremia    "slightly high"   Hypertension    does not have high blood pressure   Meningitis 08/22/2014   Palpitations    Personal history of chemotherapy    Personal history of radiation therapy    Wears glasses     PAST SURGICAL HISTORY: Past Surgical History:  Procedure Laterality Date   ABDOMINAL HYSTERECTOMY  03/13/2007   TAH ?BSO--Dr Levin Bacon, [place]   BREAST EXCISIONAL BIOPSY  04/2003   Left; benign disease   BREAST LUMPECTOMY Left 09/2013   BREAST LUMPECTOMY WITH NEEDLE LOCALIZATION AND AXILLARY LYMPH NODE DISSECTION Left 09/12/2013   Procedure: BREAST LUMPECTOMY WITH NEEDLE LOCALIZATION AND AXILLARY LYMPH NODE DISSECTION;  Surgeon: [name], MD;  Location: [facility];  Service: General;  Laterality: Left;  and axilla   BREAST SURGERY     COLONOSCOPY  10/2010   proctitis; melanosis coli   COLONOSCOPY WITH PROPOFOL N/A 01/04/2018   Dr. [name]: hemorrhoids, follow up colonoscopy in 10 years   EAR CYST EXCISION Right 09/04/2019   Procedure: EXCISION EAR MASS;  Surgeon: [name], MD;  Location: [facility];  Service: ENT;  Laterality: Right;   EXCISION/RELEASE BURSA HIP Left 03/17/2019   Procedure: Left hip open bursectomy;  Surgeon: Nicholes Stairs, MD;  Location: [facility];  Service: Orthopedics;   Laterality: Left;  75 mins   EYE SURGERY Left    "fix lazy eye"   PORTACATH PLACEMENT Right 04/21/2013   Procedure: INSERTION PORT-A-CATH;  Surgeon: [name], MD;  Location: [facility];  Service: General;  Laterality: Right;   RIGHT OOPHORECTOMY  03/13/2007   SUBOCCIPITAL CRANIECTOMY CERVICAL LAMINECTOMY N/A 05/10/2014   Procedure: SUBOCCIPITAL CRANIECTOMY CERVICAL LAMINECTOMY/DURAPLASTY;  Surgeon: [name], MD;  Location: [facility];  Service: Neurosurgery;  Laterality: N/A;  suboccipital craniectomy with cervical laminectomy and duraplasty   TUBAL LIGATION      FAMILY HISTORY Family History  Problem Relation Age of Onset   Aneurysm Mother        Cerebral   Hypertension Mother    Hyperlipidemia Mother    Stroke Mother    Coronary artery disease Father    Hypertension Father    Hyperlipidemia Father    Heart attack Father        d. 60   Lung cancer Father        dx early 67s; former smoker   Lung cancer Brother    Lung cancer Brother        d. 6y;  smoker   Lung cancer Sister        paternal half-sister; not a smoker; dx <60; d. 63y   Leukemia Maternal Aunt        "blood cancer"; d. unspecified age   Cervical cancer Paternal Aunt        d. late 68s   Breast cancer Sister 83   Other Sister        hx of hysterectomy for unspecified reason   Multiple myeloma Brother        d. 53s   Lung cancer Brother        dx late 82s; smoker   Prostate cancer Brother        dx late 65s   Prostate cancer Brother        dx late 37s   Lung cancer Brother    Lung cancer Maternal Aunt        d. unspecified age   Breast cancer Maternal Aunt        dx unspecified age   Prostate cancer Cousin        maternal 1st cousin; dx older age   Breast cancer Other        niece dx early 44s or before   Leukemia Other        nephew d. 2y; "blood cancer"   Colon cancer Neg Hx    Diabetes Neg Hx   The patient's father died at the age of 10 from a myocardial infarction. The patient's  mother died at the age of 72 from a stroke. The patient had 6 brothers, 2 sisters. One brother has a history of lung cancer in the setting of tobacco abuse. Another brother had multiple myeloma. One sister was diagnosed with breast cancer at the age of 70   GYNECOLOGIC HISTORY: (Updated 09/25/2013) Menarche age 58, first live birth age 56, the patient is [place] P4. She status post abdominal hysterectomy with bilateral salpingo-oophorectomy. She never took hormone replacement.   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  Her husband [name] works in Charity fundraiser. Daughter [name] lives at home with the patient with 1 of her 3 children. Son Betzaira Mentel lives in [place] and is not employed. Daughter Maggie Schwalbe Dillard lives in [place] and is a Barrister's clerk. Daughter Ernst Spell is a Education officer, museum in [place].  The patient has 10 grandchildren. She attends [employer]     ADVANCED DIRECTIVES: Not in place   HEALTH MAINTENANCE:  (Updated 09/25/2013)  Social History   Tobacco Use   Smoking status: Former    Packs/day: 0.50    Years: 15.00    Pack years: 7.50    Types: Cigarettes    Quit date: 07/06/1982    Years since quitting: 38.4   Smokeless tobacco: Never  Vaping Use   Vaping Use: Never used  Substance Use Topics   Alcohol use: No   Drug use: No     Colonoscopy:2012  PAP:2014   Bone density: Never  Lipid panel: Not on file    Allergies  Allergen Reactions   Doxycycline Itching and Swelling    Current Outpatient Medications  Medication Sig Dispense Refill   acyclovir (ZOVIRAX) 400 MG tablet Take 1 tablet (400 mg total) by mouth 2 (two) times daily. 180 tablet 1   atenolol (TENORMIN) 50 MG tablet Take 1 tablet (50 mg total) by mouth  2 (two) times daily. 180 tablet 5   atorvastatin (LIPITOR) 20 MG tablet Take 1 tablet (20 mg total) by mouth daily  at 6 PM. 90 tablet 1   blood glucose meter kit and supplies KIT Dispense based on patient and insurance preference. Use up to four times daily as directed. 1 each 11   blood glucose meter kit and supplies Dispense based on patient and insurance preference. Use up to test glucose once daily. For ICD -10 E11.9. 1 each 0   EPINEPHrine 0.3 mg/0.3 mL IJ SOAJ injection Inject 0.3 mg into the muscle as needed for anaphylaxis.   1   escitalopram (LEXAPRO) 20 MG tablet TAKE 1 TABLET BY MOUTH EVERY DAY 30 tablet 0   fexofenadine (ALLEGRA) 180 MG tablet Take 180 mg by mouth daily.     Lancets (ONETOUCH DELICA PLUS [id number]) MISC USE TO TEST BLOOD GLUCOSE DAILY 100 each 5   levocetirizine (XYZAL) 5 MG tablet SMARTSIG:1 Tablet(s) By Mouth Every Evening     LORazepam (ATIVAN) 0.5 MG tablet TAKE 1 TABLET BY MOUTH AT BEDTIME AS NEEDED FOR ANXIETY 10 tablet 0   Melatonin 3 MG CAPS Take 3 mg by mouth at bedtime.     metFORMIN (GLUCOPHAGE-XR) 500 MG 24 hr tablet TAKE 1 TABLET BY MOUTH TWICE A DAY WITH MEALS 180 tablet 2   methocarbamol (ROBAXIN) 500 MG tablet      ONETOUCH ULTRA test strip USE TO CHECK BLOOD SUGAR THREE TIMES A DAY 100 strip 2   pantoprazole (PROTONIX) 40 MG tablet TAKE 1 TABLET BY MOUTH DAILY BEFORE BREAKFAST 90 tablet 0   sucralfate (CARAFATE) 1 g tablet TAKE 1 TABLET BY MOUTH 4 TIMES DAILY - WITH MEALS AND AT BEDTIME. AS NEEDED FOR ACID REFLUX 120 tablet 0   No current facility-administered medications for this visit.    OBJECTIVE: Vitals:   12/16/20 0937  BP: (!) 124/58  Pulse: 65  Resp: 18  Temp: (!) 97.5 F (36.4 C)  SpO2: 99%  Body mass index is 32.65 kg/m.  ECOG: 1 Filed Weights   12/16/20 0937  Weight: 172 lb 12.8 oz (78.4 kg)   GENERAL: Patient is a well appearing female in no acute distress HEENT:  Sclerae anicteric.  Oropharynx clear and moist. No ulcerations or evidence of oropharyngeal candidiasis. Neck is supple.  NODES:  No cervical, supraclavicular, or axillary  lymphadenopathy palpated.  BREAST EXAM:  right breast benign, left breast s/p lumpectomy and radiation, no sign of local recurrence noted. LUNGS:  Clear to auscultation bilaterally.  No wheezes or rhonchi. HEART:  Regular rate and rhythm. No murmur appreciated. ABDOMEN:  Soft, nontender.  Positive, normoactive bowel sounds. No organomegaly palpated. MSK:  No focal spinal tenderness to palpation. Full range of motion bilaterally in the upper extremities. EXTREMITIES:  No peripheral edema.   SKIN:  Clear with no obvious rashes or skin changes. No nail dyscrasia. NEURO:  Nonfocal. Well oriented.  Appropriate affect.    LAB RESULTS:   Lab Results  Component Value Date   WBC 9.1 12/06/2020   NEUTROABS 5.4 12/06/2020   HGB 12.5 12/06/2020   HCT 39.2 12/06/2020   MCV 83 12/06/2020   PLT 238 12/06/2020      Chemistry      Component Value Date/Time   NA 144 07/31/2020 1432   NA 147 (H) 05/19/2017 1335   K 4.0 07/31/2020 1432   K 4.0 05/19/2017 1335   CL 104 07/31/2020 1432   CO2 28 07/31/2020 1432  CO2 28 05/19/2017 1335   BUN 10 07/31/2020 1432   BUN 8.2 05/19/2017 1335   CREATININE 0.89 07/31/2020 1432   CREATININE 1.03 06/21/2018 1403   CREATININE 0.9 05/19/2017 1335      Component Value Date/Time   CALCIUM 9.9 07/31/2020 1432   CALCIUM 9.1 05/19/2017 1335   ALKPHOS 71 07/31/2020 1432   ALKPHOS 41 05/19/2017 1335   AST 18 07/31/2020 1432   AST 16 05/19/2017 1335   ALT 29 07/31/2020 1432   ALT 14 05/19/2017 1335   BILITOT 0.3 07/31/2020 1432   BILITOT 0.36 05/19/2017 1335         STUDIES: DG Chest 2 View  Result Date: 12/10/2020 CLINICAL DATA:  Left chest pain and swollen lymph nodes. EXAM: CHEST - 2 VIEW COMPARISON:  PA and lateral chest 05/19/2017. FINDINGS: The lungs are clear. Heart size is normal. No pneumothorax or pleural effusion. Surgical clips left axilla are noted. No bony abnormality. IMPRESSION: No acute disease.  Stable compared to prior exam.  Electronically Signed   By: Inge Rise M.D.   On: 12/10/2020 09:23     ASSESSMENT/PLAN: 60 y.o. [place], [place] woman   (1)  status post left breast upper outer quadrant biopsy 03/29/2013 for a clinical T2-T3 pN1, stage IIB-IIIA invasive ductal carcinoma, grade 2-3, estrogen receptor 39% positive (with moderate staining intensity), progesterone receptor negative, with an MIB-1 of 83%, and HER-2 amplified by CISH, with a ratio of 7.5 and 15 HER-2 copies per cell  (2) neoadjuvant chemotherapy, with trastuzumab/ pertuzumab/ carboplatin/ docetaxel repeated every 3 weeks x6, completed 08/21/2013. Pertuzumab  held after 4 cycles secondary to diarrhea. The patient received Neulasta on day 2 at [facility] per her request.   (3) Trastuzumab continued for total of one year (last dose 04/13/2014)). Most recent echo 06/04/2014 showed a well preserved ejection fraction.  (4)  status post left lumpectomy and axillary node dissection 09/12/2013, with a complete pathologic response (a total of 16 lymph nodes were examined,1 sentinel and 15 non-sentinel),  YpT0, ypN0.  (5) adjuvant [facility] completed 12/08/2013  (6) started anastrozole 01/16/2014  (a) bone density 04/09/2016 shows osteopenia with a T score of -1.7   (b) switched to tamoxifen as of OCT 2017 because of vaginal dryness issues   (i) status post remote total abdominal hysterectomy with right salpingo-oophorectomy  (c) pelvic and transvaginal ultrasound found a normal left ovary, vaginal cuff appeared normal, with no area of suspicious attachment of the bowel to the cuff apex  (d) tamoxifen discontinued November 2019  (7)  genetics testing 06/22/2016 through the 21-gene Custom Cancer Panel through [employer], MD) found no deleterious mutations in ATM, BARD1, BRCA1, BRCA2, BRIP1, CDH1, CHEK2, EPCAM, FANCC, HOXB13, MLH1, MSH2, MSH6, NBN, PALB2, PMS2, PTEN, RAD51C, RAD51D, TP53, and XRCC2.    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    PLAN: Cloyce is doing moderately well today.  Her breast exam is normal, however since she has noted a change, and concern, we will get mammogram and ultrasound of the left breast.  WE will await those results.  We discussed that she has had intermittent issues in her breast for the past couple of years, so if the ultrasound still shows nothing,w e can get MRI to be 100% sure everything is ok.    Cammy is otherwise well.  We discussed healthy diet and exercise.  She will f/u with  her PCP for her other health maintenance.  I will see her back in 1 year.  She knows to call for any questions that may arise between now and her next appointment.  We are happy to see her sooner if needed.   Total encounter time: 30 minutes in face to face visit time, order entry, communication with nursing, and documentation of the encounter.  Wilber Bihari, NP  12/16/20 10:04 AM Medical Oncology and Hematology [facility] [address] [place], [place] [address] Tel. [phone]    Fax. [phone]   *Total Encounter Time as defined by the Centers for Medicare and Medicaid Services includes, in addition to the face-to-face time of a patient visit (documented in the note above) non-face-to-face time: obtaining and reviewing outside history, ordering and reviewing medications, tests or procedures, care coordination (communications with other health care professionals or caregivers) and documentation in the medical record.

## 2020-12-18 ENCOUNTER — Other Ambulatory Visit: Payer: Self-pay | Admitting: Family Medicine

## 2020-12-18 ENCOUNTER — Telehealth: Payer: Self-pay | Admitting: Family Medicine

## 2020-12-18 MED ORDER — LORAZEPAM 0.5 MG PO TABS: ORAL_TABLET | ORAL | 0 refills | Status: DC

## 2020-12-18 NOTE — Telephone Encounter (Signed)
Last seen for anxiety 08/23/20. Dr Lovena Le sent in #10 on 5/31. Pt is out of med and asking for refill has appt 6/20

## 2020-12-18 NOTE — Telephone Encounter (Signed)
Hmmmm 10 more was sent in.  Patient needs to keep appointment with Dr. Lovena Le to get her regular quantity thank you

## 2020-12-18 NOTE — Telephone Encounter (Signed)
Patient has appointment on 6/20 but needs refill on lorazepam 0.5 mg  is completely out. [facility]

## 2020-12-19 ENCOUNTER — Telehealth: Payer: Self-pay | Admitting: Adult Health

## 2020-12-19 ENCOUNTER — Telehealth: Payer: Self-pay

## 2020-12-19 ENCOUNTER — Ambulatory Visit
Admission: RE | Admit: 2020-12-19 | Discharge: 2020-12-19 | Disposition: A | Discharge status: Home / Self Care | Payer: Medicare HMO | Source: Ambulatory Visit | Attending: Adult Health | Admitting: Adult Health

## 2020-12-19 ENCOUNTER — Other Ambulatory Visit: Payer: Self-pay

## 2020-12-19 DIAGNOSIS — Z170 Estrogen receptor positive status [ER+]: Secondary | ICD-10-CM

## 2020-12-19 DIAGNOSIS — N644 Mastodynia: Secondary | ICD-10-CM | POA: Diagnosis not present

## 2020-12-19 DIAGNOSIS — R234 Changes in skin texture: Secondary | ICD-10-CM

## 2020-12-19 DIAGNOSIS — R922 Inconclusive mammogram: Secondary | ICD-10-CM | POA: Diagnosis not present

## 2020-12-19 DIAGNOSIS — C50412 Malignant neoplasm of upper-outer quadrant of left female breast: Secondary | ICD-10-CM

## 2020-12-19 NOTE — Telephone Encounter (Signed)
Returned [place] phone call

## 2020-12-19 NOTE — Telephone Encounter (Signed)
Scheduled per 6/13 los. Called pt and left a msg

## 2020-12-19 NOTE — Telephone Encounter (Signed)
Left message to return call 

## 2020-12-19 NOTE — Telephone Encounter (Signed)
Discussed with pt. Pt verbalized understanding.  °

## 2020-12-23 ENCOUNTER — Encounter: Payer: Self-pay | Admitting: Family Medicine

## 2020-12-23 ENCOUNTER — Other Ambulatory Visit: Payer: Self-pay

## 2020-12-23 ENCOUNTER — Ambulatory Visit (INDEPENDENT_AMBULATORY_CARE_PROVIDER_SITE_OTHER): Payer: Medicare HMO | Admitting: Family Medicine

## 2020-12-23 VITALS — BP 118/76 | HR 65 | Temp 97.3°F | Ht 61.0 in | Wt 173.6 lb

## 2020-12-23 DIAGNOSIS — E119 Type 2 diabetes mellitus without complications: Secondary | ICD-10-CM

## 2020-12-23 DIAGNOSIS — F32A Depression, unspecified: Secondary | ICD-10-CM | POA: Diagnosis not present

## 2020-12-23 DIAGNOSIS — E785 Hyperlipidemia, unspecified: Secondary | ICD-10-CM | POA: Diagnosis not present

## 2020-12-23 DIAGNOSIS — E1169 Type 2 diabetes mellitus with other specified complication: Secondary | ICD-10-CM

## 2020-12-23 DIAGNOSIS — R69 Illness, unspecified: Secondary | ICD-10-CM | POA: Diagnosis not present

## 2020-12-23 DIAGNOSIS — F5104 Psychophysiologic insomnia: Secondary | ICD-10-CM

## 2020-12-23 MED ORDER — ESCITALOPRAM OXALATE 20 MG PO TABS: 1.0000 | ORAL_TABLET | Freq: Every day | ORAL | 1 refills | Status: DC

## 2020-12-23 MED ORDER — LORAZEPAM 0.5 MG PO TABS: ORAL_TABLET | ORAL | 2 refills | Status: DC

## 2020-12-23 NOTE — Progress Notes (Signed)
Patient ID: [name] [name], female    DOB: 1960-08-30, 60 y.o.   MRN: [id number]   Chief Complaint  Patient presents with   Diabetes    Follow up   Subjective:    HPI Seen for f/u DM2- No new concerns per pt.  Dm2- Compliant with medications. Checking blood glucose.   Not seeing any high or low numbers.  Denies polyuria or polydipsia.  Eye exam: 6/21- no retinopathy per pt. Foot exam:  Left foot some times, lasting a few months and resolved. With sitting.  No falls or back injuries.    Seeing oncology for f/u of lump in near clavical. They called it lipoma. Some had been removed from back, and feels they could come back. Also did breast surgery. Dr. Alphonsa Overall did breast surgery.  Anxiety and depression- stable.  Doing well taking lexapro and ativan 0.78m qhs.  Pt had worked on decreasing down to 0.511mativan prn daily from 42m75mtivan daily.   HLD- doing well no new concerns.  Compliant with meds. No chest pain, palpitations, myalgias or joint pains.  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   Outpatient Encounter Medications as of 12/23/2020  Medication Sig   acyclovir (ZOVIRAX) 400 MG tablet Take 1 tablet (400 mg total) by mouth 2 (two) times daily.   atenolol (TENORMIN) 50 MG tablet Take 1 tablet (50 mg total) by mouth 2 (two) times daily.   atorvastatin (LIPITOR) 20 MG tablet Take 1 tablet (20 mg total) by mouth daily at 6 PM.   blood  glucose meter kit and supplies KIT Dispense based on patient and insurance preference. Use up to four times daily as directed.   blood glucose meter kit and supplies Dispense based on patient and insurance preference. Use up to test glucose once daily. For ICD -10 E11.9.   EPINEPHrine 0.3 mg/0.3 mL IJ SOAJ injection Inject 0.3 mg into the muscle as needed for anaphylaxis.    escitalopram (LEXAPRO) 20 MG tablet Take 1 tablet (20 mg total) by mouth daily.   fexofenadine (ALLEGRA) 180 MG tablet Take 180 mg by mouth daily.   Lancets (ONETOUCH DELICA PLUS [id number] USE TO TEST BLOOD GLUCOSE DAILY   levocetirizine (XYZAL) 5 MG tablet SMARTSIG:1 Tablet(s) By Mouth Every Evening   LORazepam (ATIVAN) 0.5 MG tablet TAKE 1 TABLET BY MOUTH AT BEDTIME AS NEEDED FOR ANXIETY   Melatonin 3 MG CAPS Take 3 mg by mouth at bedtime.   metFORMIN (GLUCOPHAGE-XR) 500 MG 24 hr tablet TAKE 1 TABLET BY MOUTH TWICE A DAY WITH MEALS (Patient taking differently: 500 mg daily.)   methocarbamol (ROBAXIN) 500 MG tablet as needed.   ONETOUCH ULTRA test strip USE TO CHECK BLOOD SUGAR THREE TIMES A DAY   pantoprazole (PROTONIX) 40 MG tablet TAKE 1 TABLET BY MOUTH DAILY BEFORE BREAKFAST (Patient taking differently: in the morning and at bedtime.)   sucralfate (CARAFATE) 1 g tablet TAKE 1 TABLET BY MOUTH 4 TIMES DAILY - WITH MEALS AND AT BEDTIME. AS NEEDED FOR ACID REFLUX (Patient taking differently: daily.)   [DISCONTINUED] escitalopram (LEXAPRO) 20 MG tablet TAKE 1 TABLET BY MOUTH  EVERY DAY   [DISCONTINUED] LORazepam (ATIVAN) 0.5 MG tablet TAKE 1 TABLET BY MOUTH AT BEDTIME AS NEEDED FOR ANXIETY   [DISCONTINUED] potassium chloride SA (K-DUR,KLOR-CON) 20 MEQ tablet Take 1 tablet (20 mEq total) by mouth 2 (two) times daily.   No facility-administered encounter medications on file as of 12/23/2020.     Review of Systems  Constitutional:  Negative for chills and fever.  HENT:  Negative for congestion, rhinorrhea and sore  throat.   Respiratory:  Negative for cough, shortness of breath and wheezing.   Cardiovascular:  Negative for chest pain and leg swelling.  Gastrointestinal:  Negative for abdominal pain, diarrhea, nausea and vomiting.  Genitourinary:  Negative for dysuria and frequency.  Musculoskeletal:  Negative for arthralgias and back pain.  Skin:  Negative for rash.  Neurological:  Negative for dizziness, weakness and headaches.  Psychiatric/Behavioral:  Negative for dysphoric mood, self-injury, sleep disturbance and suicidal ideas. The patient is not nervous/anxious.     Vitals BP 118/76   Pulse 65   Temp (!) 97.3 F (36.3 C) (Oral)   Ht '5\' 1"'  (1.549 m)   Wt 173 lb 9.6 oz (78.7 kg)   LMP 12/04/2008   SpO2 99%   BMI 32.80 kg/m   Objective:   Physical Exam Vitals and nursing note reviewed.  Constitutional:      Appearance: Normal appearance.  HENT:     Head: Normocephalic and atraumatic.     Nose: Nose normal.     Mouth/Throat:     Mouth: Mucous membranes are moist.     Pharynx: Oropharynx is clear.  Eyes:     Extraocular Movements: Extraocular movements intact.     Conjunctiva/sclera: Conjunctivae normal.     Pupils: Pupils are equal, round, and reactive to light.  Cardiovascular:     Rate and Rhythm: Normal rate and regular rhythm.     Pulses: Normal pulses.     Heart sounds: Normal heart sounds.  Pulmonary:     Effort: Pulmonary effort is normal.     Breath sounds: Normal breath sounds. No wheezing, rhonchi or rales.  Musculoskeletal:        General: Normal range of motion.     Right lower leg: No edema.     Left lower leg: No edema.  Skin:    General: Skin is warm and dry.     Findings: No lesion or rash.  Neurological:     General: No focal deficit present.     Mental Status: She is alert and oriented to person, place, and time.  Psychiatric:        Mood and Affect: Mood normal.        Behavior: Behavior normal.     Assessment and Plan   1. Type 2 diabetes  mellitus without complication, without long-term current use of insulin (HCC) - Hemoglobin A1c - CMP14+EGFR - Lipid panel  2. Psychophysiological insomnia - LORazepam (ATIVAN) 0.5 MG tablet; TAKE 1 TABLET BY MOUTH AT BEDTIME AS NEEDED FOR ANXIETY  Dispense: 30 tablet; Refill: 2  3. Chronic depression - escitalopram (LEXAPRO) 20 MG tablet; Take 1 tablet (20 mg total) by mouth daily.  Dispense: 90 tablet; Refill: 1  4. Hyperlipidemia associated with type 2 diabetes mellitus ([place]) - Lipid panel   DM2- stable.  Recheck labs.  Cont meds.  A1c at 6.2 on 01/03/21.  Anxiety/depression/insomnia- In 1/22- pt was taking 1m ativan and taking 1/2 tab bid at that time.  Advised to pt needing to work  toward decreasing this and was okay with 0.49m ativan qhs.  Pt cont with lexapro and ativan prn.  We had long discussion at that visit about reasons for long prescribng long term benzos.  Pt voiced understanding.  HLD- stable. Cont meds.  Labs reviewed, stable.    Return in about 6 months (around 06/24/2021) for f/u anxiety/depression/dm2/htn/hld.

## 2020-12-24 ENCOUNTER — Other Ambulatory Visit: Payer: Self-pay | Admitting: Nurse Practitioner

## 2020-12-25 ENCOUNTER — Encounter: Payer: Self-pay | Admitting: Internal Medicine

## 2020-12-25 ENCOUNTER — Ambulatory Visit (INDEPENDENT_AMBULATORY_CARE_PROVIDER_SITE_OTHER): Payer: Medicare HMO | Admitting: Internal Medicine

## 2020-12-25 ENCOUNTER — Other Ambulatory Visit: Payer: Self-pay

## 2020-12-25 VITALS — BP 134/78 | Temp 97.1°F | Ht 61.0 in | Wt 173.6 lb

## 2020-12-25 DIAGNOSIS — G8929 Other chronic pain: Secondary | ICD-10-CM | POA: Diagnosis not present

## 2020-12-25 DIAGNOSIS — K219 Gastro-esophageal reflux disease without esophagitis: Secondary | ICD-10-CM

## 2020-12-25 DIAGNOSIS — R1013 Epigastric pain: Secondary | ICD-10-CM

## 2020-12-25 NOTE — Patient Instructions (Signed)
We will schedule you for upper endoscopy to further evaluate your worsening reflux and epigastric pain.  Continue on pantoprazole 40 mg twice daily.  This works best if you take 30 minutes before breakfast and 30 minutes before supper.  Further recommendations to follow.  At [facility] we value your feedback. You may receive a survey about your visit today. Please share your experience as we strive to create trusting relationships with our patients to provide genuine, compassionate, quality care.  We appreciate your understanding and patience as we review any laboratory studies, imaging, and other diagnostic tests that are ordered as we care for you. Our office policy is 5 business days for review of these results, and any emergent or urgent results are addressed in a timely manner for your best interest. If you do not hear from our office in 1 week, please contact us.   We also encourage the use of MyChart, which contains your medical information for your review as well. If you are not enrolled in this feature, an access code is on this after visit summary for your convenience. Thank you for allowing [place] to be involved in your care.  It was great to see you today!  I hope you have a great rest of your summer!!    Elon Alas. Abbey Chatters, D.O. Gastroenterology and Hepatology [facility]

## 2020-12-25 NOTE — H&P (View-Only) (Signed)
Referring Provider: Nilda Simmer, NP Primary Care Physician:  Erven Colla, DO Primary GI:  Dr. Abbey Chatters  Chief Complaint  Patient presents with   Gastroesophageal Reflux    Tenderness in stomach area     HPI:   [name] [name] is a 60 y.o. female who presents to the clinic today with multiple issues.  Last seen in 2020.  She has chronic reflux which was previously well controlled on pantoprazole 40 mg twice daily.  She states she has had progressively worsening symptoms including breakthrough symptoms on a daily basis.  Feels burning in her epigastric region as well as pain.  Pain does not radiate.  Currently taking her pantoprazole both pills at lunchtime.  No chronic NSAID use.  No melena hematochezia.  No previous upper endoscopy.  Past Medical History:  Diagnosis Date   Allergic rhinitis    Anemia, unspecified 05/30/2013   Anxiety    Breast cancer ([place]) 03/31/13   left   Cancer ([place])    Chest pain 2009   Consultation-[place], negative chest CT; nl echo in 2005; h/o palpitations   Chiari malformation type I ([place])    s/p suboccipital craniectomy, C1 laminectomy, superior C2 laminectomy, duraplasty 05/10/14   Colitis 2010   not IBD   Degenerative joint disease    + degenerative joint disease of the lumbosacral spine   Depression    Diabetes ([place])    Diabetes ([place])    Dysrhythmia    palpations   GERD (gastroesophageal reflux disease)    "a little"   Headache    Heart murmur    "small"   Herpes simplex type II infection    Hypercholesteremia    "slightly high"   Hypertension    does not have high blood pressure   Meningitis 08/22/2014   Palpitations    Personal history of chemotherapy    Personal history of radiation therapy    Wears glasses     Past Surgical History:  Procedure Laterality Date   ABDOMINAL HYSTERECTOMY  03/13/2007   TAH ?BSO--Dr Levin Bacon, [place]   BREAST EXCISIONAL BIOPSY  04/2003   Left; benign disease   BREAST LUMPECTOMY Left  09/2013   BREAST LUMPECTOMY WITH NEEDLE LOCALIZATION AND AXILLARY LYMPH NODE DISSECTION Left 09/12/2013   Procedure: BREAST LUMPECTOMY WITH NEEDLE LOCALIZATION AND AXILLARY LYMPH NODE DISSECTION;  Surgeon: [name], MD;  Location: [place];  Service: General;  Laterality: Left;  and axilla   BREAST SURGERY     COLONOSCOPY  10/2010   proctitis; melanosis coli   COLONOSCOPY WITH PROPOFOL N/A 01/04/2018   Dr. [name]: hemorrhoids, follow up colonoscopy in 10 years   EAR CYST EXCISION Right 09/04/2019   Procedure: EXCISION EAR MASS;  Surgeon: [name], MD;  Location: [place];  Service: ENT;  Laterality: Right;   EXCISION/RELEASE BURSA HIP Left 03/17/2019   Procedure: Left hip open bursectomy;  Surgeon: Nicholes Stairs, MD;  Location: [place];  Service: Orthopedics;  Laterality: Left;  75 mins   EYE SURGERY Left    "fix lazy eye"   PORTACATH PLACEMENT Right 04/21/2013   Procedure: INSERTION PORT-A-CATH;  Surgeon: [name], MD;  Location[facility];  Service: General;  Laterality: Right;   RIGHT OOPHORECTOMY  03/13/2007   SUBOCCIPITAL CRANIECTOMY CERVICAL LAMINECTOMY N/A 05/10/2014   Procedure: SUBOCCIPITAL CRANIECTOMY CERVICAL LAMINECTOMY/DURAPLASTY;  Surgeon: [name], MD;  Location: [place] [facility];  Service: Neurosurgery;  Laterality: N/A;  suboccipital craniectomy  with cervical laminectomy and duraplasty   TUBAL LIGATION      Current Outpatient Medications  Medication Sig Dispense Refill   acyclovir (ZOVIRAX) 400 MG tablet Take 1 tablet (400 mg total) by mouth 2 (two) times daily. 180 tablet 1   atenolol (TENORMIN) 50 MG tablet Take 1 tablet (50 mg total) by mouth 2 (two) times daily. 180 tablet 5   atorvastatin (LIPITOR) 20 MG tablet Take 1 tablet (20 mg total) by mouth daily at 6 PM. 90 tablet 1   blood glucose meter kit and supplies KIT Dispense based on patient and insurance preference. Use up to four times daily as directed. 1 each 11   blood  glucose meter kit and supplies Dispense based on patient and insurance preference. Use up to test glucose once daily. For ICD -10 E11.9. 1 each 0   EPINEPHrine 0.3 mg/0.3 mL IJ SOAJ injection Inject 0.3 mg into the muscle as needed for anaphylaxis.   1   escitalopram (LEXAPRO) 20 MG tablet Take 1 tablet (20 mg total) by mouth daily. 90 tablet 1   fexofenadine (ALLEGRA) 180 MG tablet Take 180 mg by mouth daily.     Lancets (ONETOUCH DELICA PLUS [id number]) MISC USE TO TEST BLOOD GLUCOSE DAILY 100 each 5   levocetirizine (XYZAL) 5 MG tablet SMARTSIG:1 Tablet(s) By Mouth Every Evening     LORazepam (ATIVAN) 0.5 MG tablet TAKE 1 TABLET BY MOUTH AT BEDTIME AS NEEDED FOR ANXIETY 30 tablet 2   Melatonin 3 MG CAPS Take 3 mg by mouth at bedtime.     metFORMIN (GLUCOPHAGE-XR) 500 MG 24 hr tablet TAKE 1 TABLET BY MOUTH TWICE A DAY WITH MEALS (Patient taking differently: 500 mg daily.) 180 tablet 2   methocarbamol (ROBAXIN) 500 MG tablet as needed.     ONETOUCH ULTRA test strip USE TO CHECK BLOOD SUGAR THREE TIMES A DAY 100 strip 2   pantoprazole (PROTONIX) 40 MG tablet TAKE 1 TABLET BY MOUTH DAILY BEFORE BREAKFAST (Patient taking differently: in the morning and at bedtime.) 90 tablet 0   sucralfate (CARAFATE) 1 g tablet TAKE 1 TABLET BY MOUTH 4 TIMES DAILY - WITH MEALS AND AT BEDTIME. AS NEEDED FOR ACID REFLUX (Patient taking differently: daily.) 120 tablet 0   No current facility-administered medications for this visit.    Allergies as of 12/25/2020 - Review Complete 12/25/2020  Allergen Reaction Noted   Doxycycline Itching and Swelling 06/27/2012    Family History  Problem Relation Age of Onset   Aneurysm Mother        Cerebral   Hypertension Mother    Hyperlipidemia Mother    Stroke Mother    Coronary artery disease Father    Hypertension Father    Hyperlipidemia Father    Heart attack Father        d. 29   Lung cancer Father        dx early 24s; former smoker   Lung cancer Brother     Lung cancer Brother        d. 13y; smoker   Lung cancer Sister        paternal half-sister; not a smoker; dx <60; d. 60y   Leukemia Maternal Aunt        "blood cancer"; d. unspecified age   Cervical cancer Paternal Aunt        d. late 76s   Breast cancer Sister 67   Other Sister        hx of hysterectomy for  unspecified reason   Multiple myeloma Brother        d. 34s   Lung cancer Brother        dx late 23s; smoker   Prostate cancer Brother        dx late 65s   Prostate cancer Brother        dx late 50s   Lung cancer Brother    Lung cancer Maternal Aunt        d. unspecified age   Breast cancer Maternal Aunt        dx unspecified age   Prostate cancer Cousin        maternal 1st cousin; dx older age   Breast cancer Other        niece dx early 2s or before   Leukemia Other        nephew d. 2y; "blood cancer"   Colon cancer Neg Hx    Diabetes Neg Hx     Social History   Socioeconomic History   Marital status: Married    Spouse name: Not on file   Number of children: 4   Years of education: Not on file   Highest education level: Not on file  Occupational History   Occupation: [place] work    Fish farm manager: Engineer, [employer]  Tobacco Use   Smoking status: Former    Packs/day: 0.50    Years: 15.00    Pack years: 7.50    Types: Cigarettes    Quit date: 07/06/1982    Years since quitting: 38.4   Smokeless tobacco: Never  Vaping Use   Vaping Use: Never used  Substance and Sexual Activity   Alcohol use: No   Drug use: No   Sexual activity: Yes    Partners: Male    Birth control/protection: Surgical    Comment: TAH  Other Topics Concern   Not on file  Social History Narrative   Not on file   Social Determinants of Health   Financial Resource Strain: Not on file  Food Insecurity: Not on file  Transportation Needs: Not on file  Physical Activity: Not on file  Stress: Not on file  Social Connections: Not on file    Subjective: Review of Systems   Constitutional:  Negative for chills and fever.  HENT:  Negative for congestion and hearing loss.   Eyes:  Negative for blurred vision and double vision.  Respiratory:  Negative for cough and shortness of breath.   Cardiovascular:  Negative for chest pain and palpitations.  Gastrointestinal:  Positive for abdominal pain and heartburn. Negative for blood in stool, constipation, diarrhea, melena and vomiting.  Genitourinary:  Negative for dysuria and urgency.  Musculoskeletal:  Negative for joint pain and myalgias.  Skin:  Negative for itching and rash.  Neurological:  Negative for dizziness and headaches.  Psychiatric/Behavioral:  Negative for depression. The patient is not nervous/anxious.     Objective: BP 134/78   Temp (!) 97.1 F (36.2 C) (Temporal)   Ht _0  (1.549 m)   Wt 173 lb 9.6 oz (78.7 kg)   LMP 12/04/2008   BMI 32.80 kg/m  Physical Exam Constitutional:      Appearance: Normal appearance.  HENT:     Head: Normocephalic and atraumatic.  Eyes:     Extraocular Movements: Extraocular movements intact.     Conjunctiva/sclera: Conjunctivae normal.  Cardiovascular:     Rate and Rhythm: Normal rate and regular rhythm.  Pulmonary:     Effort: Pulmonary effort  is normal.     Breath sounds: Normal breath sounds.  Abdominal:     General: Bowel sounds are normal.     Palpations: Abdomen is soft.  Musculoskeletal:        General: No swelling. Normal range of motion.     Cervical back: Normal range of motion and neck supple.  Skin:    General: Skin is warm and dry.     Coloration: Skin is not jaundiced.  Neurological:     General: No focal deficit present.     Mental Status: She is alert and oriented to person, place, and time.  Psychiatric:        Mood and Affect: Mood normal.        Behavior: Behavior normal.     Assessment: *Chronic reflux-not well controlled on pantoprazole *Epigastric pain  Plan: Discussed reflux in depth with patient today.  She states  she is currently taking her pantoprazole both tablets at the same time around lunch.  I have recommended that she change this to 40 mg 30 minutes before breakfast and 40 mg 30 minutes before dinner.  I have also offered to switch to Dexilant and she would like to hold off for now.  Will schedule for EGD to evaluate for peptic ulcer disease, esophagitis, gastritis, H. Pylori, duodenitis, or other. Will also evaluate for esophageal stricture, Schatzki's ring, esophageal web or other.   The risks including infection, bleed, or perforation as well as benefits, limitations, alternatives and imponderables have been reviewed with the patient. Potential for esophageal dilation, biopsy, etc. have also been reviewed.  Questions have been answered. All parties agreeable.  If EGD unremarkable, can consider ultrasound to evaluate for biliary colic.  Further recommendations to follow.  12/25/2020 11:29 AM   Disclaimer: This note was dictated with voice recognition software. Similar sounding words can inadvertently be transcribed and may not be corrected upon review.

## 2020-12-25 NOTE — Progress Notes (Signed)
Referring Provider: Nilda Simmer, NP Primary Care Physician:  Erven Colla, DO Primary GI:  Dr. Abbey Chatters  Chief Complaint  Patient presents with   Gastroesophageal Reflux    Tenderness in stomach area     HPI:   [name] [name] is a 60 y.o. female who presents to the clinic today with multiple issues.  Last seen in 2020.  She has chronic reflux which was previously well controlled on pantoprazole 40 mg twice daily.  She states she has had progressively worsening symptoms including breakthrough symptoms on a daily basis.  Feels burning in her epigastric region as well as pain.  Pain does not radiate.  Currently taking her pantoprazole both pills at lunchtime.  No chronic NSAID use.  No melena hematochezia.  No previous upper endoscopy.  Past Medical History:  Diagnosis Date   Allergic rhinitis    Anemia, unspecified 05/30/2013   Anxiety    Breast cancer ([place]) 03/31/13   left   Cancer ([place])    Chest pain 2009   Consultation-[facility], negative chest CT; nl echo in 2005; h/o palpitations   Chiari malformation type I ([place])    s/p suboccipital craniectomy, C1 laminectomy, superior C2 laminectomy, duraplasty 05/10/14   Colitis 2010   not IBD   Degenerative joint disease    + degenerative joint disease of the lumbosacral spine   Depression    Diabetes ([place])    Diabetes ([place])    Dysrhythmia    palpations   GERD (gastroesophageal reflux disease)    "a little"   Headache    Heart murmur    "small"   Herpes simplex type II infection    Hypercholesteremia    "slightly high"   Hypertension    does not have high blood pressure   Meningitis 08/22/2014   Palpitations    Personal history of chemotherapy    Personal history of radiation therapy    Wears glasses     Past Surgical History:  Procedure Laterality Date   ABDOMINAL HYSTERECTOMY  03/13/2007   TAH ?BSO--Dr Levin Bacon, [place]   BREAST EXCISIONAL BIOPSY  04/2003   Left; benign disease   BREAST LUMPECTOMY Left  09/2013   BREAST LUMPECTOMY WITH NEEDLE LOCALIZATION AND AXILLARY LYMPH NODE DISSECTION Left 09/12/2013   Procedure: BREAST LUMPECTOMY WITH NEEDLE LOCALIZATION AND AXILLARY LYMPH NODE DISSECTION;  Surgeon: [name], MD;  Location: [facility];  Service: General;  Laterality: Left;  and axilla   BREAST SURGERY     COLONOSCOPY  10/2010   proctitis; melanosis coli   COLONOSCOPY WITH PROPOFOL N/A 01/04/2018   Dr. [name]: hemorrhoids, follow up colonoscopy in 10 years   EAR CYST EXCISION Right 09/04/2019   Procedure: EXCISION EAR MASS;  Surgeon: [name], MD;  Location: [facility];  Service: ENT;  Laterality: Right;   EXCISION/RELEASE BURSA HIP Left 03/17/2019   Procedure: Left hip open bursectomy;  Surgeon: Nicholes Stairs, MD;  Location: [facility];  Service: Orthopedics;  Laterality: Left;  75 mins   EYE SURGERY Left    "fix lazy eye"   PORTACATH PLACEMENT Right 04/21/2013   Procedure: INSERTION PORT-A-CATH;  Surgeon: [name], MD;  Location: [facility];  Service: General;  Laterality: Right;   RIGHT OOPHORECTOMY  03/13/2007   SUBOCCIPITAL CRANIECTOMY CERVICAL LAMINECTOMY N/A 05/10/2014   Procedure: SUBOCCIPITAL CRANIECTOMY CERVICAL LAMINECTOMY/DURAPLASTY;  Surgeon: [name], MD;  Location: [facility];  Service: Neurosurgery;  Laterality: N/A;  suboccipital craniectomy  with cervical laminectomy and duraplasty   TUBAL LIGATION      Current Outpatient Medications  Medication Sig Dispense Refill   acyclovir (ZOVIRAX) 400 MG tablet Take 1 tablet (400 mg total) by mouth 2 (two) times daily. 180 tablet 1   atenolol (TENORMIN) 50 MG tablet Take 1 tablet (50 mg total) by mouth 2 (two) times daily. 180 tablet 5   atorvastatin (LIPITOR) 20 MG tablet Take 1 tablet (20 mg total) by mouth daily at 6 PM. 90 tablet 1   blood glucose meter kit and supplies KIT Dispense based on patient and insurance preference. Use up to four times daily as directed. 1 each 11   blood  glucose meter kit and supplies Dispense based on patient and insurance preference. Use up to test glucose once daily. For ICD -10 E11.9. 1 each 0   EPINEPHrine 0.3 mg/0.3 mL IJ SOAJ injection Inject 0.3 mg into the muscle as needed for anaphylaxis.   1   escitalopram (LEXAPRO) 20 MG tablet Take 1 tablet (20 mg total) by mouth daily. 90 tablet 1   fexofenadine (ALLEGRA) 180 MG tablet Take 180 mg by mouth daily.     Lancets (ONETOUCH DELICA PLUS [id number]) MISC USE TO TEST BLOOD GLUCOSE DAILY 100 each 5   levocetirizine (XYZAL) 5 MG tablet SMARTSIG:1 Tablet(s) By Mouth Every Evening     LORazepam (ATIVAN) 0.5 MG tablet TAKE 1 TABLET BY MOUTH AT BEDTIME AS NEEDED FOR ANXIETY 30 tablet 2   Melatonin 3 MG CAPS Take 3 mg by mouth at bedtime.     metFORMIN (GLUCOPHAGE-XR) 500 MG 24 hr tablet TAKE 1 TABLET BY MOUTH TWICE A DAY WITH MEALS (Patient taking differently: 500 mg daily.) 180 tablet 2   methocarbamol (ROBAXIN) 500 MG tablet as needed.     ONETOUCH ULTRA test strip USE TO CHECK BLOOD SUGAR THREE TIMES A DAY 100 strip 2   pantoprazole (PROTONIX) 40 MG tablet TAKE 1 TABLET BY MOUTH DAILY BEFORE BREAKFAST (Patient taking differently: in the morning and at bedtime.) 90 tablet 0   sucralfate (CARAFATE) 1 g tablet TAKE 1 TABLET BY MOUTH 4 TIMES DAILY - WITH MEALS AND AT BEDTIME. AS NEEDED FOR ACID REFLUX (Patient taking differently: daily.) 120 tablet 0   No current facility-administered medications for this visit.    Allergies as of 12/25/2020 - Review Complete 12/25/2020  Allergen Reaction Noted   Doxycycline Itching and Swelling 06/27/2012    Family History  Problem Relation Age of Onset   Aneurysm Mother        Cerebral   Hypertension Mother    Hyperlipidemia Mother    Stroke Mother    Coronary artery disease Father    Hypertension Father    Hyperlipidemia Father    Heart attack Father        d. 29   Lung cancer Father        dx early 24s; former smoker   Lung cancer Brother     Lung cancer Brother        d. 13y; smoker   Lung cancer Sister        paternal half-sister; not a smoker; dx <60; d. 60y   Leukemia Maternal Aunt        "blood cancer"; d. unspecified age   Cervical cancer Paternal Aunt        d. late 76s   Breast cancer Sister 67   Other Sister        hx of hysterectomy for  unspecified reason   Multiple myeloma Brother        d. 34s   Lung cancer Brother        dx late 23s; smoker   Prostate cancer Brother        dx late 65s   Prostate cancer Brother        dx late 50s   Lung cancer Brother    Lung cancer Maternal Aunt        d. unspecified age   Breast cancer Maternal Aunt        dx unspecified age   Prostate cancer Cousin        maternal 1st cousin; dx older age   Breast cancer Other        niece dx early 2s or before   Leukemia Other        nephew d. 2y; "blood cancer"   Colon cancer Neg Hx    Diabetes Neg Hx     Social History   Socioeconomic History   Marital status: Married    Spouse name: Not on file   Number of children: 4   Years of education: Not on file   Highest education level: Not on file  Occupational History   Occupation: [employer] work    Fish farm manager: Engineer, [employer]  Tobacco Use   Smoking status: Former    Packs/day: 0.50    Years: 15.00    Pack years: 7.50    Types: Cigarettes    Quit date: 07/06/1982    Years since quitting: 38.4   Smokeless tobacco: Never  Vaping Use   Vaping Use: Never used  Substance and Sexual Activity   Alcohol use: No   Drug use: No   Sexual activity: Yes    Partners: Male    Birth control/protection: Surgical    Comment: TAH  Other Topics Concern   Not on file  Social History Narrative   Not on file   Social Determinants of Health   Financial Resource Strain: Not on file  Food Insecurity: Not on file  Transportation Needs: Not on file  Physical Activity: Not on file  Stress: Not on file  Social Connections: Not on file    Subjective: Review of Systems   Constitutional:  Negative for chills and fever.  HENT:  Negative for congestion and hearing loss.   Eyes:  Negative for blurred vision and double vision.  Respiratory:  Negative for cough and shortness of breath.   Cardiovascular:  Negative for chest pain and palpitations.  Gastrointestinal:  Positive for abdominal pain and heartburn. Negative for blood in stool, constipation, diarrhea, melena and vomiting.  Genitourinary:  Negative for dysuria and urgency.  Musculoskeletal:  Negative for joint pain and myalgias.  Skin:  Negative for itching and rash.  Neurological:  Negative for dizziness and headaches.  Psychiatric/Behavioral:  Negative for depression. The patient is not nervous/anxious.     Objective: BP 134/78   Temp (!) 97.1 F (36.2 C) (Temporal)   Ht _0  (1.549 m)   Wt 173 lb 9.6 oz (78.7 kg)   LMP 12/04/2008   BMI 32.80 kg/m  Physical Exam Constitutional:      Appearance: Normal appearance.  HENT:     Head: Normocephalic and atraumatic.  Eyes:     Extraocular Movements: Extraocular movements intact.     Conjunctiva/sclera: Conjunctivae normal.  Cardiovascular:     Rate and Rhythm: Normal rate and regular rhythm.  Pulmonary:     Effort: Pulmonary effort  is normal.     Breath sounds: Normal breath sounds.  Abdominal:     General: Bowel sounds are normal.     Palpations: Abdomen is soft.  Musculoskeletal:        General: No swelling. Normal range of motion.     Cervical back: Normal range of motion and neck supple.  Skin:    General: Skin is warm and dry.     Coloration: Skin is not jaundiced.  Neurological:     General: No focal deficit present.     Mental Status: She is alert and oriented to person, place, and time.  Psychiatric:        Mood and Affect: Mood normal.        Behavior: Behavior normal.     Assessment: *Chronic reflux-not well controlled on pantoprazole *Epigastric pain  Plan: Discussed reflux in depth with patient today.  She states  she is currently taking her pantoprazole both tablets at the same time around lunch.  I have recommended that she change this to 40 mg 30 minutes before breakfast and 40 mg 30 minutes before dinner.  I have also offered to switch to Dexilant and she would like to hold off for now.  Will schedule for EGD to evaluate for peptic ulcer disease, esophagitis, gastritis, H. Pylori, duodenitis, or other. Will also evaluate for esophageal stricture, Schatzki's ring, esophageal web or other.   The risks including infection, bleed, or perforation as well as benefits, limitations, alternatives and imponderables have been reviewed with the patient. Potential for esophageal dilation, biopsy, etc. have also been reviewed.  Questions have been answered. All parties agreeable.  If EGD unremarkable, can consider ultrasound to evaluate for biliary colic.  Further recommendations to follow.  12/25/2020 11:29 AM   Disclaimer: This note was dictated with voice recognition software. Similar sounding words can inadvertently be transcribed and may not be corrected upon review.

## 2020-12-27 ENCOUNTER — Other Ambulatory Visit: Payer: Self-pay | Admitting: Family Medicine

## 2020-12-27 DIAGNOSIS — F5104 Psychophysiologic insomnia: Secondary | ICD-10-CM

## 2020-12-30 ENCOUNTER — Telehealth: Payer: Self-pay | Admitting: Family Medicine

## 2020-12-30 NOTE — Telephone Encounter (Signed)
[facility] but was sent to [facility]ic[facility]. Will try again

## 2020-12-30 NOTE — Telephone Encounter (Signed)
Please advise. Thank you

## 2020-12-30 NOTE — Telephone Encounter (Signed)
Patient has gone to pharmacy twice and they told her [facility]nt [facility]. She was seen 6/20 for medication refill. Can it be resent in lorazepam 0.5 mg and escitalopam 20 mg.

## 2020-12-31 NOTE — Telephone Encounter (Signed)
Pt returned call and verbalized understanding  

## 2020-12-31 NOTE — Telephone Encounter (Signed)
[facility] Pt last filled Celexa on 12/15/20 so not due until 01/14/21. Pt can get Lorazepam filled at this time. Left message to return call to inform patient.

## 2021-01-03 DIAGNOSIS — E785 Hyperlipidemia, unspecified: Secondary | ICD-10-CM | POA: Diagnosis not present

## 2021-01-03 DIAGNOSIS — E119 Type 2 diabetes mellitus without complications: Secondary | ICD-10-CM | POA: Diagnosis not present

## 2021-01-03 DIAGNOSIS — E1169 Type 2 diabetes mellitus with other specified complication: Secondary | ICD-10-CM | POA: Diagnosis not present

## 2021-01-04 LAB — CMP14+EGFR
ALT: 19 IU/L (ref 0–32)
AST: 17 IU/L (ref 0–40)
Albumin/Globulin Ratio: 1.9 (ref 1.2–2.2)
Albumin: 4.6 g/dL (ref 3.8–4.9)
Alkaline Phosphatase: 77 IU/L (ref 44–121)
BUN/Creatinine Ratio: 10 (ref 9–23)
BUN: 10 mg/dL (ref 6–24)
Bilirubin Total: 0.3 mg/dL (ref 0.0–1.2)
CO2: 26 mmol/L (ref 20–29)
Calcium: 9.3 mg/dL (ref 8.7–10.2)
Chloride: 103 mmol/L (ref 96–106)
Creatinine, Ser: 0.98 mg/dL (ref 0.57–1.00)
Globulin, Total: 2.4 g/dL (ref 1.5–4.5)
Glucose: 135 mg/dL — ABNORMAL HIGH (ref 65–99)
Potassium: 4.1 mmol/L (ref 3.5–5.2)
Sodium: 143 mmol/L (ref 134–144)
Total Protein: 7 g/dL (ref 6.0–8.5)
eGFR: 66 mL/min/{1.73_m2} (ref >59)

## 2021-01-04 LAB — LIPID PANEL
Chol/HDL Ratio: 3.1 ratio (ref 0.0–4.4)
Cholesterol, Total: 196 mg/dL (ref 100–199)
HDL: 63 mg/dL (ref >39)
LDL Chol Calc (NIH): 108 mg/dL — ABNORMAL HIGH (ref 0–99)
Triglycerides: 146 mg/dL (ref 0–149)
VLDL Cholesterol Cal: 25 mg/dL (ref 5–40)

## 2021-01-04 LAB — HEMOGLOBIN A1C
Est. average glucose Bld gHb Est-mCnc: 128 mg/dL
Hgb A1c MFr Bld: 6.1 % — ABNORMAL HIGH (ref 4.8–5.6)

## 2021-01-14 ENCOUNTER — Encounter (HOSPITAL_COMMUNITY)
Admission: RE | Disposition: A | Discharge status: Home / Self Care | Payer: Self-pay | Source: Home / Self Care | Attending: Internal Medicine

## 2021-01-14 ENCOUNTER — Encounter (HOSPITAL_COMMUNITY): Payer: Self-pay

## 2021-01-14 ENCOUNTER — Telehealth: Payer: Self-pay | Admitting: Internal Medicine

## 2021-01-14 ENCOUNTER — Ambulatory Visit (HOSPITAL_COMMUNITY): Payer: Medicare HMO | Admitting: Anesthesiology

## 2021-01-14 ENCOUNTER — Other Ambulatory Visit: Payer: Self-pay

## 2021-01-14 ENCOUNTER — Ambulatory Visit (HOSPITAL_COMMUNITY)
Admission: RE | Admit: 2021-01-14 | Discharge: 2021-01-14 | Disposition: A | Discharge status: Home / Self Care | Payer: Medicare HMO | Attending: Internal Medicine | Admitting: Internal Medicine

## 2021-01-14 DIAGNOSIS — Z809 Family history of malignant neoplasm, unspecified: Secondary | ICD-10-CM | POA: Diagnosis not present

## 2021-01-14 DIAGNOSIS — Z7984 Long term (current) use of oral hypoglycemic drugs: Secondary | ICD-10-CM | POA: Insufficient documentation

## 2021-01-14 DIAGNOSIS — Z853 Personal history of malignant neoplasm of breast: Secondary | ICD-10-CM | POA: Diagnosis not present

## 2021-01-14 DIAGNOSIS — Z881 Allergy status to other antibiotic agents status: Secondary | ICD-10-CM | POA: Diagnosis not present

## 2021-01-14 DIAGNOSIS — Z79899 Other long term (current) drug therapy: Secondary | ICD-10-CM | POA: Diagnosis not present

## 2021-01-14 DIAGNOSIS — Z8049 Family history of malignant neoplasm of other genital organs: Secondary | ICD-10-CM | POA: Insufficient documentation

## 2021-01-14 DIAGNOSIS — Z823 Family history of stroke: Secondary | ICD-10-CM | POA: Insufficient documentation

## 2021-01-14 DIAGNOSIS — R12 Heartburn: Secondary | ICD-10-CM | POA: Diagnosis not present

## 2021-01-14 DIAGNOSIS — Z8042 Family history of malignant neoplasm of prostate: Secondary | ICD-10-CM | POA: Insufficient documentation

## 2021-01-14 DIAGNOSIS — K2970 Gastritis, unspecified, without bleeding: Secondary | ICD-10-CM | POA: Diagnosis not present

## 2021-01-14 DIAGNOSIS — R69 Illness, unspecified: Secondary | ICD-10-CM | POA: Diagnosis not present

## 2021-01-14 DIAGNOSIS — Z923 Personal history of irradiation: Secondary | ICD-10-CM | POA: Insufficient documentation

## 2021-01-14 DIAGNOSIS — E119 Type 2 diabetes mellitus without complications: Secondary | ICD-10-CM | POA: Insufficient documentation

## 2021-01-14 DIAGNOSIS — R1011 Right upper quadrant pain: Secondary | ICD-10-CM

## 2021-01-14 DIAGNOSIS — K3189 Other diseases of stomach and duodenum: Secondary | ICD-10-CM | POA: Diagnosis not present

## 2021-01-14 DIAGNOSIS — Z801 Family history of malignant neoplasm of trachea, bronchus and lung: Secondary | ICD-10-CM | POA: Diagnosis not present

## 2021-01-14 DIAGNOSIS — K219 Gastro-esophageal reflux disease without esophagitis: Secondary | ICD-10-CM | POA: Insufficient documentation

## 2021-01-14 DIAGNOSIS — Z803 Family history of malignant neoplasm of breast: Secondary | ICD-10-CM | POA: Insufficient documentation

## 2021-01-14 DIAGNOSIS — Z806 Family history of leukemia: Secondary | ICD-10-CM | POA: Diagnosis not present

## 2021-01-14 DIAGNOSIS — Z9221 Personal history of antineoplastic chemotherapy: Secondary | ICD-10-CM | POA: Diagnosis not present

## 2021-01-14 DIAGNOSIS — Z87891 Personal history of nicotine dependence: Secondary | ICD-10-CM | POA: Insufficient documentation

## 2021-01-14 DIAGNOSIS — Z8249 Family history of ischemic heart disease and other diseases of the circulatory system: Secondary | ICD-10-CM | POA: Diagnosis not present

## 2021-01-14 HISTORY — PX: ESOPHAGOGASTRODUODENOSCOPY (EGD) WITH PROPOFOL: SHX5813

## 2021-01-14 HISTORY — PX: BIOPSY: SHX5522

## 2021-01-14 LAB — GLUCOSE, CAPILLARY: Glucose-Capillary: 106 mg/dL — ABNORMAL HIGH (ref 70–99)

## 2021-01-14 SURGERY — ESOPHAGOGASTRODUODENOSCOPY (EGD) WITH PROPOFOL
Anesthesia: General

## 2021-01-14 MED ORDER — DEXLANSOPRAZOLE 60 MG PO CPDR: 60.0000 mg | DELAYED_RELEASE_CAPSULE | Freq: Every day | ORAL | 5 refills | Status: DC

## 2021-01-14 MED ORDER — LACTATED RINGERS IV SOLN: INTRAVENOUS | Status: DC

## 2021-01-14 MED ORDER — PROPOFOL 10 MG/ML IV BOLUS
INTRAVENOUS | Status: DC | PRN
  Administered 2021-01-14: 40 mg via INTRAVENOUS
  Administered 2021-01-14: 100 mg via INTRAVENOUS

## 2021-01-14 MED ORDER — LIDOCAINE HCL (CARDIAC) PF 100 MG/5ML IV SOSY
PREFILLED_SYRINGE | INTRAVENOUS | Status: DC | PRN
  Administered 2021-01-14: 50 mg via INTRAVENOUS

## 2021-01-14 NOTE — Telephone Encounter (Signed)
[place] abd RUQ scheduled for 01/21/21 at 10:30am, arrive at 10:15am. NPO after midnight before test.  Called and informed pt of [place] appt. Letter mailed.

## 2021-01-14 NOTE — Telephone Encounter (Signed)
Can we please schedule this patient for right upper quadrant ultrasound to evaluate for right upper quadrant abdominal pain?  Thank you

## 2021-01-14 NOTE — Anesthesia Procedure Notes (Signed)
Date/Time: 01/14/2021 8:45 AM Performed by: Orlie Dakin, CRNA Pre-anesthesia Checklist: Patient identified, Emergency Drugs available, Suction available and Patient being monitored Patient Re-evaluated:Patient Re-evaluated prior to induction Oxygen Delivery Method: Nasal cannula Induction Type: IV induction Placement Confirmation: positive ETCO2

## 2021-01-14 NOTE — Anesthesia Postprocedure Evaluation (Signed)
Anesthesia Post Note  Patient: [name] [name]  Procedure(s) Performed: ESOPHAGOGASTRODUODENOSCOPY (EGD) WITH PROPOFOL BIOPSY  Patient location during evaluation: Phase II Anesthesia Type: General Level of consciousness: awake Pain management: pain level controlled Vital Signs Assessment: post-procedure vital signs reviewed and stable Respiratory status: spontaneous breathing and respiratory function stable Cardiovascular status: blood pressure returned to baseline and stable Postop Assessment: no headache and no apparent nausea or vomiting Anesthetic complications: no Comments: Late entry   No notable events documented.   Last Vitals:  Vitals:   01/14/21 0751 01/14/21 0850  BP: 116/65 120/62  Pulse: (!) 59   Resp: 15 20  Temp: 36.7 C 36.7 C  SpO2: 99% 99%    Last Pain:  Vitals:   01/14/21 0850  TempSrc: Oral  PainSc: 0-No pain                 Louann Sjogren

## 2021-01-14 NOTE — Anesthesia Preprocedure Evaluation (Signed)
Anesthesia Evaluation  Patient identified by MRN, date of birth, ID band Patient awake    Reviewed: Allergy & Precautions, H&P , NPO status , Patient's Chart, lab work & pertinent test results, reviewed documented beta blocker date and time   Airway Mallampati: II  TM Distance: >3 FB Neck ROM: full    Dental no notable dental hx.    Pulmonary neg pulmonary ROS, former smoker,    Pulmonary exam normal breath sounds clear to auscultation       Cardiovascular Exercise Tolerance: Good hypertension, negative cardio ROS   Rhythm:regular Rate:Normal     Neuro/Psych  Headaches, PSYCHIATRIC DISORDERS Anxiety Depression    GI/Hepatic Neg liver ROS, GERD  Medicated,  Endo/Other  negative endocrine ROSdiabetes  Renal/GU negative Renal ROS  negative genitourinary   Musculoskeletal   Abdominal   Peds  Hematology  (+) Blood dyscrasia, anemia ,   Anesthesia Other Findings   Reproductive/Obstetrics negative OB ROS                             Anesthesia Physical Anesthesia Plan  ASA: 2  Anesthesia Plan: General   Post-op Pain Management:    Induction:   PONV Risk Score and Plan: Propofol infusion  Airway Management Planned:   Additional Equipment:   Intra-op Plan:   Post-operative Plan:   Informed Consent: I have reviewed the patients History and Physical, chart, labs and discussed the procedure including the risks, benefits and alternatives for the proposed anesthesia with the patient or authorized representative who has indicated his/her understanding and acceptance.     Dental Advisory Given  Plan Discussed with: CRNA  Anesthesia Plan Comments:         Anesthesia Quick Evaluation

## 2021-01-14 NOTE — Op Note (Signed)
[facility] Patient Name: [name] [name] Procedure Date: 01/14/2021 8:31 AM MRN: [id number] Date of Birth: 30-Nov-1960 Attending MD: Elon Alas. Edgar Frisk CSN: [id number] Age: 60 Admit Type: Outpatient Procedure:                Upper GI endoscopy Indications:              Epigastric abdominal pain, Heartburn Providers:                Elon Alas. Abbey Chatters, DO, Gwenlyn Fudge, RN, Dereck Leep, Technician Referring MD:              Medicines:                See the Anesthesia note for documentation of the                            administered medications Complications:            No immediate complications. Estimated Blood Loss:     Estimated blood loss was minimal. Procedure:                Pre-Anesthesia Assessment:                           - The anesthesia plan was to use monitored                            anesthesia care (MAC).                           After obtaining informed consent, the endoscope was                            passed under direct vision. Throughout the                            procedure, the patient's blood pressure, pulse, and                            oxygen saturations were monitored continuously. The                            GIF-H190 ([id number]) scope was introduced through the                            mouth, and advanced to the second part of duodenum.                            The upper GI endoscopy was accomplished without                            difficulty. The patient tolerated the procedure                            well. Scope  In: 8:43:34 AM Scope Out: 8:46:15 AM Total Procedure Duration: 0 hours 2 minutes 41 seconds  Findings:      The Z-line was regular and was found 36 cm from the incisors.      There is no endoscopic evidence of bleeding, areas of erosion,       esophagitis, hiatal hernia, ulcerations or varices in the entire       esophagus.      Localized mild inflammation characterized  by erythema was found in the       gastric antrum. Biopsies were taken with a cold forceps for Helicobacter       pylori testing.      The duodenal bulb, first portion of the duodenum and second portion of       the duodenum were normal. Impression:               - Z-line regular, 36 cm from the incisors.                           - Gastritis. Biopsied.                           - Normal duodenal bulb, first portion of the                            duodenum and second portion of the duodenum. Moderate Sedation:      Per Anesthesia Care Recommendation:           - Patient has a contact number available for                            emergencies. The signs and symptoms of potential                            delayed complications were discussed with the                            patient. Return to normal activities tomorrow.                            Written discharge instructions were provided to the                            patient.                           - Resume previous diet.                           - Continue present medications.                           - Await pathology results.                           - No ibuprofen, naproxen, or other non-steroidal  anti-inflammatory drugs.                           - Consider switching to Dexilant.                           - Will order RUQ [place] to rule out biliary colic                           - Return to GI clinic in 3 months. Procedure Code(s):        --- Professional ---                           [id number], Esophagogastroduodenoscopy, flexible,                            transoral; with biopsy, single or multiple Diagnosis Code(s):        --- Professional ---                           K29.70, Gastritis, unspecified, without bleeding                           R10.13, Epigastric pain                           R12, Heartburn CPT copyright 2019 American Medical Association. All rights reserved. The codes  documented in this report are preliminary and upon coder review may  be revised to meet current compliance requirements. Elon Alas. Abbey Chatters, DO [place] Abbey Chatters, DO 01/14/2021 8:53:50 AM This report has been signed electronically. Number of Addenda: 0

## 2021-01-14 NOTE — Addendum Note (Signed)
Addended by: Zara Council C on: 01/14/2021 11:57 AM   Modules accepted: Orders

## 2021-01-14 NOTE — Telephone Encounter (Signed)
See other phone note from Dr. Abbey Chatters.

## 2021-01-14 NOTE — Transfer of Care (Signed)
Immediate Anesthesia Transfer of Care Note  Patient: [name] [name]  Procedure(s) Performed: ESOPHAGOGASTRODUODENOSCOPY (EGD) WITH PROPOFOL BIOPSY  Patient Location: Endoscopy Unit  Anesthesia Type:General  Level of Consciousness: awake  Airway & Oxygen Therapy: Patient Spontanous Breathing  Post-op Assessment: Report given to RN and Post -op Vital signs reviewed and stable  Post vital signs: Reviewed and stable  Last Vitals:  Vitals Value Taken Time  BP 120/62 01/14/21 0850  Temp 36.7 C 01/14/21 0850  Pulse    Resp 20 01/14/21 0850  SpO2 99 % 01/14/21 0850    Last Pain:  Vitals:   01/14/21 0850  TempSrc: Oral  PainSc: 0-No pain      Patients Stated Pain Goal: 7 (22/97/98 9211)  Complications: No notable events documented.

## 2021-01-14 NOTE — Telephone Encounter (Signed)
Tammy from Short Stay called to make PP FU in 3 months and is aware of OV. She also said that patient needed to be scheduled for an U/S of her gallbladder.

## 2021-01-14 NOTE — Interval H&P Note (Signed)
History and Physical Interval Note:  01/14/2021 8:06 AM  [name] [name]  has presented today for surgery, with the diagnosis of GERD, epigastric pain.  The various methods of treatment have been discussed with the patient and family. After consideration of risks, benefits and other options for treatment, the patient has consented to  Procedure(s) with comments: ESOPHAGOGASTRODUODENOSCOPY (EGD) WITH PROPOFOL (N/A) - 9:00am as a surgical intervention.  The patient's history has been reviewed, patient examined, no change in status, stable for surgery.  I have reviewed the patient's chart and labs.  Questions were answered to the patient's satisfaction.     Eloise Harman

## 2021-01-14 NOTE — Discharge Instructions (Addendum)
EGD Discharge instructions Please read the instructions outlined below and refer to this sheet in the next few weeks. These discharge instructions provide you with general information on caring for yourself after you leave the hospital. Your doctor may also give you specific instructions. While your treatment has been planned according to the most current medical practices available, unavoidable complications occasionally occur. If you have any problems or questions after discharge, please call your doctor. ACTIVITY You may resume your regular activity but move at a slower pace for the next 24 hours.  Take frequent rest periods for the next 24 hours.  Walking will help expel (get rid of) the air and reduce the bloated feeling in your abdomen.  No driving for 24 hours (because of the anesthesia (medicine) used during the test).  You may shower.  Do not sign any important legal documents or operate any machinery for 24 hours (because of the anesthesia used during the test).  NUTRITION Drink plenty of fluids.  You may resume your normal diet.  Begin with a light meal and progress to your normal diet.  Avoid alcoholic beverages for 24 hours or as instructed by your caregiver.  MEDICATIONS You may resume your normal medications unless your caregiver tells you otherwise.  WHAT YOU CAN EXPECT TODAY You may experience abdominal discomfort such as a feeling of fullness or "gas" pains.  FOLLOW-UP Your doctor will discuss the results of your test with you.  SEEK IMMEDIATE MEDICAL ATTENTION IF ANY OF THE FOLLOWING OCCUR: Excessive nausea (feeling sick to your stomach) and/or vomiting.  Severe abdominal pain and distention (swelling).  Trouble swallowing.  Temperature over 101 F (37.8 C).  Rectal bleeding or vomiting of blood.   Your EGD revealed a mild amount inflammation in your stomach.  I biopsied this to rule infection a bacteria called H. pylori.  Await pathology results, my office will  contact you.  I am going to switch your pantoprazole to Dexilant 60 mg daily.  Take this 30 minutes before breakfast.  We will also schedule you for ultrasound to evaluate your gallbladder as a potential cause of your symptoms.  We will call you with these results.  Otherwise follow-up with GI in 3 months.   I hope you have a great rest of your week!  Elon Alas. Abbey Chatters, D.O. Gastroenterology and Hepatology [facility]

## 2021-01-16 LAB — SURGICAL PATHOLOGY

## 2021-01-20 ENCOUNTER — Encounter (HOSPITAL_COMMUNITY): Payer: Self-pay | Admitting: Internal Medicine

## 2021-01-20 ENCOUNTER — Ambulatory Visit: Payer: Medicare HMO | Admitting: Adult Health

## 2021-01-21 ENCOUNTER — Other Ambulatory Visit: Payer: Self-pay

## 2021-01-21 ENCOUNTER — Ambulatory Visit (HOSPITAL_COMMUNITY)
Admission: RE | Admit: 2021-01-21 | Discharge: 2021-01-21 | Disposition: A | Discharge status: Home / Self Care | Payer: Medicare HMO | Source: Ambulatory Visit | Attending: Internal Medicine | Admitting: Internal Medicine

## 2021-01-21 DIAGNOSIS — K7689 Other specified diseases of liver: Secondary | ICD-10-CM | POA: Diagnosis not present

## 2021-01-21 DIAGNOSIS — R1011 Right upper quadrant pain: Secondary | ICD-10-CM | POA: Diagnosis not present

## 2021-01-22 ENCOUNTER — Ambulatory Visit: Payer: Medicare HMO | Admitting: Adult Health

## 2021-01-30 ENCOUNTER — Other Ambulatory Visit: Payer: Self-pay | Admitting: Family Medicine

## 2021-02-03 ENCOUNTER — Other Ambulatory Visit: Payer: Self-pay | Admitting: Family Medicine

## 2021-02-03 ENCOUNTER — Telehealth: Payer: Self-pay | Admitting: Family Medicine

## 2021-02-03 MED ORDER — ATENOLOL 50 MG PO TABS: 50.0000 mg | ORAL_TABLET | Freq: Two times a day (BID) | ORAL | 1 refills | Status: DC

## 2021-02-03 MED ORDER — ATORVASTATIN CALCIUM 20 MG PO TABS: 20.0000 mg | ORAL_TABLET | Freq: Every day | ORAL | 1 refills | Status: DC

## 2021-02-03 NOTE — Telephone Encounter (Signed)
Patient is needing refill on atenolo 50 mg [facility] [place] . She  has been out  for 5 days now. Please advise

## 2021-02-03 NOTE — Telephone Encounter (Signed)
Pt contacted and verbalized understanding.  

## 2021-02-03 NOTE — Progress Notes (Signed)
Sent in refill for atenolol and atorvastatin.  Dr. Lovena Le

## 2021-02-03 NOTE — Telephone Encounter (Signed)
Pt last seen 12/23/20 for DM. Please advise. Thank you

## 2021-03-12 DIAGNOSIS — J3081 Allergic rhinitis due to animal (cat) (dog) hair and dander: Secondary | ICD-10-CM | POA: Diagnosis not present

## 2021-03-12 DIAGNOSIS — J301 Allergic rhinitis due to pollen: Secondary | ICD-10-CM | POA: Diagnosis not present

## 2021-03-12 DIAGNOSIS — T783XXA Angioneurotic edema, initial encounter: Secondary | ICD-10-CM | POA: Diagnosis not present

## 2021-03-12 DIAGNOSIS — J3089 Other allergic rhinitis: Secondary | ICD-10-CM | POA: Diagnosis not present

## 2021-04-08 ENCOUNTER — Other Ambulatory Visit: Payer: Self-pay | Admitting: Family Medicine

## 2021-04-09 ENCOUNTER — Encounter: Payer: Self-pay | Admitting: Internal Medicine

## 2021-04-17 ENCOUNTER — Other Ambulatory Visit: Payer: Self-pay | Admitting: Family Medicine

## 2021-04-17 DIAGNOSIS — Z1231 Encounter for screening mammogram for malignant neoplasm of breast: Secondary | ICD-10-CM

## 2021-04-18 DIAGNOSIS — J301 Allergic rhinitis due to pollen: Secondary | ICD-10-CM | POA: Diagnosis not present

## 2021-04-18 DIAGNOSIS — J3081 Allergic rhinitis due to animal (cat) (dog) hair and dander: Secondary | ICD-10-CM | POA: Diagnosis not present

## 2021-04-18 DIAGNOSIS — J3089 Other allergic rhinitis: Secondary | ICD-10-CM | POA: Diagnosis not present

## 2021-04-25 DIAGNOSIS — J301 Allergic rhinitis due to pollen: Secondary | ICD-10-CM | POA: Diagnosis not present

## 2021-04-25 DIAGNOSIS — J3089 Other allergic rhinitis: Secondary | ICD-10-CM | POA: Diagnosis not present

## 2021-04-25 DIAGNOSIS — J3081 Allergic rhinitis due to animal (cat) (dog) hair and dander: Secondary | ICD-10-CM | POA: Diagnosis not present

## 2021-04-29 DIAGNOSIS — J3089 Other allergic rhinitis: Secondary | ICD-10-CM | POA: Diagnosis not present

## 2021-04-29 DIAGNOSIS — J3081 Allergic rhinitis due to animal (cat) (dog) hair and dander: Secondary | ICD-10-CM | POA: Diagnosis not present

## 2021-04-29 DIAGNOSIS — J301 Allergic rhinitis due to pollen: Secondary | ICD-10-CM | POA: Diagnosis not present

## 2021-05-01 DIAGNOSIS — J3081 Allergic rhinitis due to animal (cat) (dog) hair and dander: Secondary | ICD-10-CM | POA: Diagnosis not present

## 2021-05-01 DIAGNOSIS — J3089 Other allergic rhinitis: Secondary | ICD-10-CM | POA: Diagnosis not present

## 2021-05-01 DIAGNOSIS — J301 Allergic rhinitis due to pollen: Secondary | ICD-10-CM | POA: Diagnosis not present

## 2021-05-06 DIAGNOSIS — J3089 Other allergic rhinitis: Secondary | ICD-10-CM | POA: Diagnosis not present

## 2021-05-06 DIAGNOSIS — J3081 Allergic rhinitis due to animal (cat) (dog) hair and dander: Secondary | ICD-10-CM | POA: Diagnosis not present

## 2021-05-06 DIAGNOSIS — J301 Allergic rhinitis due to pollen: Secondary | ICD-10-CM | POA: Diagnosis not present

## 2021-05-09 DIAGNOSIS — J3089 Other allergic rhinitis: Secondary | ICD-10-CM | POA: Diagnosis not present

## 2021-05-09 DIAGNOSIS — J301 Allergic rhinitis due to pollen: Secondary | ICD-10-CM | POA: Diagnosis not present

## 2021-05-09 DIAGNOSIS — J3081 Allergic rhinitis due to animal (cat) (dog) hair and dander: Secondary | ICD-10-CM | POA: Diagnosis not present

## 2021-05-13 DIAGNOSIS — J3081 Allergic rhinitis due to animal (cat) (dog) hair and dander: Secondary | ICD-10-CM | POA: Diagnosis not present

## 2021-05-13 DIAGNOSIS — J3089 Other allergic rhinitis: Secondary | ICD-10-CM | POA: Diagnosis not present

## 2021-05-13 DIAGNOSIS — J301 Allergic rhinitis due to pollen: Secondary | ICD-10-CM | POA: Diagnosis not present

## 2021-05-14 ENCOUNTER — Other Ambulatory Visit: Payer: Self-pay

## 2021-05-14 ENCOUNTER — Ambulatory Visit: Payer: Medicare HMO | Admitting: Internal Medicine

## 2021-05-14 ENCOUNTER — Encounter: Payer: Self-pay | Admitting: Internal Medicine

## 2021-05-14 ENCOUNTER — Ambulatory Visit
Admission: RE | Admit: 2021-05-14 | Discharge: 2021-05-14 | Disposition: A | Discharge status: Home / Self Care | Payer: Medicare HMO | Source: Ambulatory Visit | Attending: Family Medicine | Admitting: Family Medicine

## 2021-05-14 VITALS — BP 123/70 | HR 61 | Temp 97.1°F | Ht 61.0 in | Wt 177.6 lb

## 2021-05-14 DIAGNOSIS — Z1231 Encounter for screening mammogram for malignant neoplasm of breast: Secondary | ICD-10-CM | POA: Diagnosis not present

## 2021-05-14 DIAGNOSIS — K760 Fatty (change of) liver, not elsewhere classified: Secondary | ICD-10-CM | POA: Diagnosis not present

## 2021-05-14 DIAGNOSIS — K219 Gastro-esophageal reflux disease without esophagitis: Secondary | ICD-10-CM | POA: Diagnosis not present

## 2021-05-14 MED ORDER — PANTOPRAZOLE SODIUM 40 MG PO TBEC: 40.0000 mg | DELAYED_RELEASE_TABLET | Freq: Two times a day (BID) | ORAL | 11 refills | Status: DC

## 2021-05-14 MED ORDER — FAMOTIDINE 40 MG PO TABS: 40.0000 mg | ORAL_TABLET | Freq: Every day | ORAL | 5 refills | Status: DC

## 2021-05-14 NOTE — Patient Instructions (Addendum)
For your chronic reflux and heartburn, I am going to switch you from the Dexilant to pantoprazole 40 mg twice daily.  I want you to take this medication 30 minutes before breakfast and 30 minutes before dinner.  To help with nighttime reflux symptoms, I am also going to send in a new medication called famotidine 40 mg.  I want you to take this before bedtime.  Continue to avoid NSAIDs.  We will monitor your fatty liver disease.  Most important thing you can do is keep your diabetes and cholesterol under good control.  Would also recommend eating more fruits and vegetables and losing weight.  If you can lose 17 pounds over time, we will we will more than likely see a reduction in fatty infiltration of your liver.  Recommend 30 minutes of exercise 4 to 5 days a week.  See below for further information.  It was great seeing you again today.  Dr. Abbey Chatters  Nonalcoholic Fatty Liver Disease Diet, Adult Nonalcoholic fatty liver disease is a condition that causes fat to build up in and around the liver. The disease makes it harder for the liver to work the way that it should. Following a healthy diet can help to keep nonalcoholic fatty liver disease under control. It can also help to prevent or improve conditions that are associated with the disease, such as heart disease, diabetes, high blood pressure, and abnormal cholesterol levels. Along with regular exercise, this diet: Promotes weight loss. Helps to control blood sugar levels. Helps to improve the way that the body uses insulin. What are tips for following this plan? Reading food labels  Always check food labels for: The amount of saturated fat in a food. You should limit your intake of saturated fat. Saturated fat is found in foods that come from animals, including meat and dairy products such as butter, cheese, and whole milk. The amount of fiber in a food. You should choose high-fiber foods such as fruits, vegetables, and whole grains. Try  to get 25-30 grams (g) of fiber a day.   Cooking When cooking, use heart-healthy oils that are high in monounsaturated fats. These include olive oil, canola oil, and avocado oil. Limit frying or deep-frying foods. Cook foods using healthy methods such as baking, boiling, steaming, and grilling instead. Meal planning You may want to keep track of how many calories you take in. Eating the right amount of calories will help you achieve a healthy weight. Meeting with a registered dietitian can help you get started. Limit how often you eat takeout and fast food. These foods are usually very high in fat, salt, and sugar. Use the glycemic index (GI) to plan your meals. The index tells you how quickly a food will raise your blood sugar. Choose low-GI foods (GI less than 55). These foods take a longer time to raise blood sugar. A registered dietitian can help you identify foods lower on the GI scale. Lifestyle You may want to follow a Mediterranean diet. This diet includes a lot of vegetables, lean meats or fish, whole grains, fruits, and healthy oils and fats. What foods can I eat?    Fruits Bananas. Apples. Oranges. Grapes. Papaya. Mango. Pomegranate. Kiwi. Grapefruit. Cherries. Vegetables Lettuce. Spinach. Peas. Beets. Cauliflower. Cabbage. Broccoli. Carrots. Tomatoes. Squash. Eggplant. Herbs. Peppers. Onions. Cucumbers. Brussels sprouts. Yams and sweet potatoes. Beans. Lentils. Grains Whole wheat or whole-grain foods, including breads, crackers, cereals, and pasta. Stone-ground whole wheat. Unsweetened oatmeal. Bulgur. Barley. Quinoa. Brown or wild rice. Corn  or whole wheat flour tortillas. Meats and other proteins Lean meats. Poultry. Tofu. Seafood and shellfish. Dairy Low-fat or fat-free dairy products, such as yogurt, cottage cheese, or cheese. Beverages Water. Sugar-free drinks. Tea. Coffee. Low-fat or skim milk. Milk alternatives, such as soy or almond milk. Real fruit juice. Fats and  oils Avocado. Canola or olive oil. Nuts and nut butters. Seeds. Seasonings and condiments Mustard. Relish. Low-fat, low-sugar ketchup and barbecue sauce. Low-fat or fat-free mayonnaise. Sweets and desserts Sugar-free sweets. The items listed above may not be a complete list of foods and beverages you can eat. Contact a dietitian for more information. What foods should I limit or avoid? Meats and other proteins Limit red meat to 1-2 times a week. Dairy [employer]. Fats and oils Palm oil and coconut oil. Fried foods. Other foods Processed foods. Foods that contain a lot of salt or sodium. Sweets and desserts Sweets that contain sugar. Beverages Sweetened drinks, such as sweet tea, milkshakes, iced sweet drinks, and sodas. Alcohol. The items listed above may not be a complete list of foods and beverages you should avoid. Contact a dietitian for more information. Where to find more information The [employer] of Diabetes and Digestive and Kidney Diseases: [email or web address] Summary Nonalcoholic fatty liver disease is a condition that causes fat to build up in and around the liver. Following a healthy diet can help to keep nonalcoholic fatty liver disease under control. Your diet should be rich in fruits, vegetables, whole grains, and lean proteins. Limit your intake of saturated fat. Saturated fat is found in foods that come from animals, including meat and dairy products such as butter, cheese, and whole milk. This diet promotes weight loss, helps to control blood sugar levels, and helps to improve the way that the body uses insulin. This information is not intended to replace advice given to you by your health care provider. Make sure you discuss any questions you have with your health care provider. Document Revised: 10/14/2018 Document Reviewed: 07/14/2018 Elsevier Patient Education  [place] and home remedies TO MANAGE REFLUX/HEARTBURN    You may  eliminate or reduce the frequency of heartburn by making the following lifestyle changes:   Control your weight. Being overweight is a major risk factor for heartburn and GERD. Excess pounds put pressure on your abdomen, pushing up your stomach and causing acid to back up into your esophagus.    Eat smaller meals. 4 TO 6 MEALS A DAY. This reduces pressure on the lower esophageal sphincter, helping to prevent the valve from opening and acid from washing back into your esophagus.     Loosen your belt. Clothes that fit tightly around your waist put pressure on your abdomen and the lower esophageal sphincter.     Eliminate heartburn triggers. Everyone has specific triggers. Common triggers such as fatty or fried foods, spicy food, tomato sauce, carbonated beverages, alcohol, chocolate, mint, garlic, onion, caffeine and nicotine may make heartburn worse.    Avoid stooping or bending. Tying your shoes is OK. Bending over for longer periods to weed your garden isn't, especially soon after eating.    Don't lie down after a meal. Wait at least three to four hours after eating before going to bed, and don't lie down right after eating.   At [facility] we value your feedback. You may receive a survey about your visit today. Please share your experience as we strive to create trusting relationships with our patients to provide genuine, compassionate,  quality care.  We appreciate your understanding and patience as we review any laboratory studies, imaging, and other diagnostic tests that are ordered as we care for you. Our office policy is 5 business days for review of these results, and any emergent or urgent results are addressed in a timely manner for your best interest. If you do not hear from our office in 1 week, please contact us.   We also encourage the use of MyChart, which contains your medical information for your review as well. If you are not enrolled in this feature, an access  code is on this after visit summary for your convenience. Thank you for allowing [place] to be involved in your care.  It was great to see you today!  I hope you have a great rest of your Fall!    Elon Alas. Abbey Chatters, D.O. Gastroenterology and Hepatology [facility]

## 2021-05-14 NOTE — Progress Notes (Signed)
Referring Provider: Erven Colla, DO Primary Care Physician:  Erven Colla, DO Primary GI:  Dr. Abbey Chatters  Chief Complaint  Patient presents with   Gastroesophageal Reflux    Having a lot of reflux, sometimes feels like having heart attack it is very severe    HPI:   [name] [name] is a 60 y.o. female who presents to the clinic today for follow-up visit.  She has chronic reflux which was she currently takes Dexilant 60 mg daily.  She states this does not work as well as the pantoprazole previously.  Notes breakthrough symptoms often.  Feels burning in her epigastric region as well as pain.  Pain does not radiate.  No chronic NSAID use.  No melena hematochezia.    EGD 01/14/2021 unremarkable besides mild H. pylori negative gastritis.  States her symptoms primarily occur at night while trying to sleep.  She underwent right upper quadrant ultrasound 01/21/2021 which was negative besides fatty liver disease.  Risk factors for fatty liver include being overweight, diabetes, high cholesterol.  No previous or current alcohol use.  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  Past Medical History:  Diagnosis Date   Allergic rhinitis    Anemia, unspecified 05/30/2013   Anxiety    Breast cancer ([place]) 03/31/13   left   Cancer ([place])    Chest pain 2009   Consultation-[facility], negative chest CT; nl echo in 2005; h/o palpitations   Chiari malformation type I ([place])    s/p suboccipital craniectomy, C1 laminectomy, superior C2 laminectomy, duraplasty 05/10/14   Colitis 2010   not IBD   Degenerative joint disease    + degenerative joint disease of the lumbosacral spine   Depression    Diabetes ([place])    Diabetes ([place])    Dysrhythmia    palpations   GERD (gastroesophageal reflux disease)    "a little"   Headache    Heart murmur    "small"   Herpes simplex type II infection    Hypercholesteremia    "slightly high"   Hypertension    does not have high blood pressure    Meningitis 08/22/2014   Palpitations    Personal history of chemotherapy    Personal history of radiation therapy    Wears glasses     Past Surgical History:  Procedure Laterality Date   ABDOMINAL HYSTERECTOMY  03/13/2007   TAH ?BSO--Dr Levin Bacon, [place]   BIOPSY  01/14/2021   Procedure: BIOPSY;  Surgeon: Eloise Harman, DO;  [facility];  Service: Endoscopy;;   BREAST EXCISIONAL BIOPSY  04/2003   Left; benign disease   BREAST LUMPECTOMY Left 09/2013   BREAST LUMPECTOMY WITH NEEDLE LOCALIZATION AND AXILLARY LYMPH NODE DISSECTION Left 09/12/2013   Procedure: BREAST LUMPECTOMY WITH NEEDLE LOCALIZATION AND AXILLARY LYMPH NODE DISSECTION;  Surgeon: [name], MD;  Location: [place];  Service: General;  Laterality: Left;  and axilla   BREAST SURGERY     COLONOSCOPY  10/2010   proctitis; melanosis coli   COLONOSCOPY WITH PROPOFOL N/A 01/04/2018   Dr. [name]: hemorrhoids, follow up colonoscopy in 10 years   EAR CYST EXCISION Right 09/04/2019   Procedure: EXCISION EAR MASS;  Surgeon: [name], MD;  Location: [place];  Service: ENT;  Laterality: Right;   ESOPHAGOGASTRODUODENOSCOPY (EGD) WITH PROPOFOL N/A 01/14/2021   Procedure: ESOPHAGOGASTRODUODENOSCOPY (EGD) WITH PROPOFOL;  Surgeon: Eloise Harman, DO;  [facility];  Service: Endoscopy;  Laterality:  N/A;  9:00am   EXCISION/RELEASE BURSA HIP Left 03/17/2019   Procedure: Left hip open bursectomy;  Surgeon: Nicholes Stairs, MD;  Location: [place];  Service: Orthopedics;  Laterality: Left;  75 mins   EYE SURGERY Left    "fix lazy eye"   PORTACATH PLACEMENT Right 04/21/2013   Procedure: INSERTION PORT-A-CATH;  Surgeon: [name], MD;  Location: [facility];  Service: General;  Laterality: Right;   RIGHT OOPHORECTOMY  03/13/2007   SUBOCCIPITAL CRANIECTOMY CERVICAL LAMINECTOMY N/A 05/10/2014   Procedure: SUBOCCIPITAL CRANIECTOMY CERVICAL LAMINECTOMY/DURAPLASTY;  Surgeon: [name], MD;  Location: [place] [facility];  Service: Neurosurgery;  Laterality: N/A;  suboccipital craniectomy with cervical laminectomy and duraplasty   TUBAL LIGATION      Current Outpatient Medications  Medication Sig Dispense Refill   acyclovir (ZOVIRAX) 400 MG tablet Take 1 tablet (400 mg total) by mouth 2 (two) times daily. 180 tablet 1   atenolol (TENORMIN) 50 MG tablet Take 1 tablet (50 mg total) by mouth 2 (two) times daily. 180 tablet 1   atorvastatin (LIPITOR) 20 MG tablet Take 1 tablet (20 mg total) by mouth daily at 6 PM. 90 tablet 1   blood glucose meter kit and supplies KIT Dispense based on patient and insurance preference. Use up to four times daily as directed. 1 each 11   blood glucose meter kit and supplies Dispense based on patient and insurance preference. Use up to test glucose once daily. For ICD -10 E11.9. 1 each 0   diphenhydramine-acetaminophen (TYLENOL PM) 25-500 MG TABS tablet Take 1 tablet by mouth at bedtime as needed (sleep).     EPINEPHrine 0.3 mg/0.3 mL IJ SOAJ injection Inject 0.3 mg into the muscle as needed for anaphylaxis.   1   escitalopram (LEXAPRO) 20 MG tablet Take 1 tablet (20 mg total) by mouth daily. 90 tablet 1   famotidine (PEPCID) 40 MG tablet Take 1 tablet (40 mg total) by mouth at bedtime. 30 tablet 5   fexofenadine (ALLEGRA) 180 MG tablet Take 180 mg by mouth daily.     glucose blood (ONETOUCH ULTRA) test strip USE TO CHECK BLOOD SUGAR THREE TIMES A DAY 100 strip 0   Lancets (ONETOUCH DELICA PLUS VELFYB01B) MISC USE TO TEST BLOOD GLUCOSE DAILY 100 each 5   levocetirizine (XYZAL) 5 MG tablet Take 5 mg by mouth every evening.     LORazepam (ATIVAN) 0.5 MG tablet TAKE 1 TABLET BY MOUTH AT BEDTIME AS NEEDED FOR ANXIETY (Patient taking differently: Take 0.5 mg by mouth at bedtime as needed for sleep.) 30 tablet 2   Melatonin 10 MG TABS Take 10 mg by mouth at bedtime.     metFORMIN (GLUCOPHAGE-XR) 500 MG 24 hr tablet TAKE 1 TABLET BY MOUTH TWICE A DAY  WITH MEALS (Patient taking differently: 500 mg daily.) 180 tablet 2   methocarbamol (ROBAXIN) 500 MG tablet Take 500 mg by mouth daily as needed for muscle spasms.     pantoprazole (PROTONIX) 40 MG tablet Take 1 tablet (40 mg total) by mouth 2 (two) times daily before a meal. 60 tablet 11   sucralfate (CARAFATE) 1 g tablet TAKE 1 TABLET BY MOUTH 4 TIMES DAILY - WITH MEALS AND AT BEDTIME. AS NEEDED FOR ACID REFLUX (Patient not taking: Reported on 05/14/2021) 120 tablet 0   No current facility-administered medications for this visit.    Allergies as of 05/14/2021 - Review Complete 05/14/2021  Allergen Reaction Noted   Doxycycline Itching and Swelling 06/27/2012  Family History  Problem Relation Age of Onset   Aneurysm Mother        Cerebral   Hypertension Mother    Hyperlipidemia Mother    Stroke Mother    Coronary artery disease Father    Hypertension Father    Hyperlipidemia Father    Heart attack Father        d. 39   Lung cancer Father        dx early 51s; former smoker   Lung cancer Brother    Lung cancer Brother        d. 87y; smoker   Lung cancer Sister        paternal half-sister; not a smoker; dx <60; d. 73y   Leukemia Maternal Aunt        "blood cancer"; d. unspecified age   Cervical cancer Paternal Aunt        d. late 60s   Breast cancer Sister 34   Other Sister        hx of hysterectomy for unspecified reason   Multiple myeloma Brother        d. 51s   Lung cancer Brother        dx late 21s; smoker   Prostate cancer Brother        dx late 67s   Prostate cancer Brother        dx late 53s   Lung cancer Brother    Lung cancer Maternal Aunt        d. unspecified age   Breast cancer Maternal Aunt        dx unspecified age   Prostate cancer Cousin        maternal 1st cousin; dx older age   Breast cancer Other        niece dx early 21s or before   Leukemia Other        nephew d. 2y; "blood cancer"   Colon cancer Neg Hx    Diabetes Neg Hx     Social  History   Socioeconomic History   Marital status: Married    Spouse name: Not on file   Number of children: 4   Years of education: Not on file   Highest education level: Not on file  Occupational History   Occupation: [place] work    Fish farm manager: Engineer, [employer]  Tobacco Use   Smoking status: Former    Packs/day: 0.50    Years: 15.00    Pack years: 7.50    Types: Cigarettes    Quit date: 07/06/1982    Years since quitting: 38.8   Smokeless tobacco: Never  Vaping Use   Vaping Use: Never used  Substance and Sexual Activity   Alcohol use: No   Drug use: No   Sexual activity: Yes    Partners: Male    Birth control/protection: Surgical    Comment: TAH  Other Topics Concern   Not on file  Social History Narrative   Not on file   Social Determinants of Health   Financial Resource Strain: Not on file  Food Insecurity: Not on file  Transportation Needs: Not on file  Physical Activity: Not on file  Stress: Not on file  Social Connections: Not on file    Subjective: Review of Systems  Constitutional:  Negative for chills and fever.  HENT:  Negative for congestion and hearing loss.   Eyes:  Negative for blurred vision and double vision.  Respiratory:  Negative for cough and shortness  of breath.   Cardiovascular:  Negative for chest pain and palpitations.  Gastrointestinal:  Positive for abdominal pain and heartburn. Negative for blood in stool, constipation, diarrhea, melena and vomiting.  Genitourinary:  Negative for dysuria and urgency.  Musculoskeletal:  Negative for joint pain and myalgias.  Skin:  Negative for itching and rash.  Neurological:  Negative for dizziness and headaches.  Psychiatric/Behavioral:  Negative for depression. The patient is not nervous/anxious.     Objective: BP 123/70   Pulse 61   Temp (!) 97.1 F (36.2 C)   Ht _0  (1.549 m)   Wt 177 lb 9.6 oz (80.6 kg)   LMP 12/04/2008   BMI 33.56 kg/m  Physical Exam Constitutional:       Appearance: Normal appearance.  HENT:     Head: Normocephalic and atraumatic.  Eyes:     Extraocular Movements: Extraocular movements intact.     Conjunctiva/sclera: Conjunctivae normal.  Cardiovascular:     Rate and Rhythm: Normal rate and regular rhythm.  Pulmonary:     Effort: Pulmonary effort is normal.     Breath sounds: Normal breath sounds.  Abdominal:     General: Bowel sounds are normal.     Palpations: Abdomen is soft.  Musculoskeletal:        General: No swelling. Normal range of motion.     Cervical back: Normal range of motion and neck supple.  Skin:    General: Skin is warm and dry.     Coloration: Skin is not jaundiced.  Neurological:     General: No focal deficit present.     Mental Status: She is alert and oriented to person, place, and time.  Psychiatric:        Mood and Affect: Mood normal.        Behavior: Behavior normal.     Assessment: *Chronic reflux-not well controlled  *Epigastric pain *Fatty liver disease  Plan: Discussed reflux in depth with her today.  I will switch her back to pantoprazole 40 mg twice daily she states this helped better than the Dexilant.  I will also add on famotidine to take nightly.  Recommended she avoid NSAIDs.  Discussed fatty liver in depth with her today.  Most recent LFTs WNL. Recommend 1-2# weight loss per week until ideal body weight through exercise & diet. Low fat/cholesterol diet.   Avoid sweets, sodas, fruit juices, sweetened beverages like tea, etc. Gradually increase exercise from 15 min daily up to 1 hr per day 5 days/week. Limit alcohol use.  Follow-up in 3 months.  05/14/2021 3:03 PM   Disclaimer: This note was dictated with voice recognition software. Similar sounding words can inadvertently be transcribed and may not be corrected upon review.

## 2021-05-16 ENCOUNTER — Ambulatory Visit: Payer: Medicare HMO | Admitting: Gastroenterology

## 2021-05-16 DIAGNOSIS — J3089 Other allergic rhinitis: Secondary | ICD-10-CM | POA: Diagnosis not present

## 2021-05-16 DIAGNOSIS — J301 Allergic rhinitis due to pollen: Secondary | ICD-10-CM | POA: Diagnosis not present

## 2021-05-16 DIAGNOSIS — J3081 Allergic rhinitis due to animal (cat) (dog) hair and dander: Secondary | ICD-10-CM | POA: Diagnosis not present

## 2021-05-21 DIAGNOSIS — J3089 Other allergic rhinitis: Secondary | ICD-10-CM | POA: Diagnosis not present

## 2021-05-21 DIAGNOSIS — J3081 Allergic rhinitis due to animal (cat) (dog) hair and dander: Secondary | ICD-10-CM | POA: Diagnosis not present

## 2021-05-21 DIAGNOSIS — J301 Allergic rhinitis due to pollen: Secondary | ICD-10-CM | POA: Diagnosis not present

## 2021-05-23 DIAGNOSIS — J301 Allergic rhinitis due to pollen: Secondary | ICD-10-CM | POA: Diagnosis not present

## 2021-05-23 DIAGNOSIS — J3089 Other allergic rhinitis: Secondary | ICD-10-CM | POA: Diagnosis not present

## 2021-05-23 DIAGNOSIS — J3081 Allergic rhinitis due to animal (cat) (dog) hair and dander: Secondary | ICD-10-CM | POA: Diagnosis not present

## 2021-05-26 DIAGNOSIS — J3081 Allergic rhinitis due to animal (cat) (dog) hair and dander: Secondary | ICD-10-CM | POA: Diagnosis not present

## 2021-05-26 DIAGNOSIS — J301 Allergic rhinitis due to pollen: Secondary | ICD-10-CM | POA: Diagnosis not present

## 2021-05-26 DIAGNOSIS — J3089 Other allergic rhinitis: Secondary | ICD-10-CM | POA: Diagnosis not present

## 2021-06-01 ENCOUNTER — Other Ambulatory Visit: Payer: Self-pay | Admitting: Family Medicine

## 2021-06-01 DIAGNOSIS — F5104 Psychophysiologic insomnia: Secondary | ICD-10-CM

## 2021-06-03 NOTE — Telephone Encounter (Signed)
/  Sent my chart message 06/03/21

## 2021-06-04 ENCOUNTER — Other Ambulatory Visit: Payer: Self-pay | Admitting: Family Medicine

## 2021-06-04 ENCOUNTER — Other Ambulatory Visit: Payer: Self-pay | Admitting: Nurse Practitioner

## 2021-06-04 DIAGNOSIS — J3081 Allergic rhinitis due to animal (cat) (dog) hair and dander: Secondary | ICD-10-CM | POA: Diagnosis not present

## 2021-06-04 DIAGNOSIS — J301 Allergic rhinitis due to pollen: Secondary | ICD-10-CM | POA: Diagnosis not present

## 2021-06-04 DIAGNOSIS — F5104 Psychophysiologic insomnia: Secondary | ICD-10-CM

## 2021-06-04 DIAGNOSIS — J3089 Other allergic rhinitis: Secondary | ICD-10-CM | POA: Diagnosis not present

## 2021-06-04 NOTE — Telephone Encounter (Signed)
Patient has appointment 12/5 but is out of her lorazepam. Please advise  [facility]  CB#  [phone]

## 2021-06-06 DIAGNOSIS — J3081 Allergic rhinitis due to animal (cat) (dog) hair and dander: Secondary | ICD-10-CM | POA: Diagnosis not present

## 2021-06-06 DIAGNOSIS — J301 Allergic rhinitis due to pollen: Secondary | ICD-10-CM | POA: Diagnosis not present

## 2021-06-06 DIAGNOSIS — J3089 Other allergic rhinitis: Secondary | ICD-10-CM | POA: Diagnosis not present

## 2021-06-09 ENCOUNTER — Other Ambulatory Visit: Payer: Self-pay

## 2021-06-09 ENCOUNTER — Encounter: Payer: Self-pay | Admitting: Nurse Practitioner

## 2021-06-09 ENCOUNTER — Ambulatory Visit (INDEPENDENT_AMBULATORY_CARE_PROVIDER_SITE_OTHER): Payer: Medicare HMO | Admitting: Nurse Practitioner

## 2021-06-09 VITALS — BP 118/82 | HR 57 | Temp 97.2°F | Ht 61.0 in | Wt 177.6 lb

## 2021-06-09 DIAGNOSIS — F32A Depression, unspecified: Secondary | ICD-10-CM

## 2021-06-09 DIAGNOSIS — F5104 Psychophysiologic insomnia: Secondary | ICD-10-CM

## 2021-06-09 DIAGNOSIS — E785 Hyperlipidemia, unspecified: Secondary | ICD-10-CM

## 2021-06-09 DIAGNOSIS — I10 Essential (primary) hypertension: Secondary | ICD-10-CM

## 2021-06-09 DIAGNOSIS — R69 Illness, unspecified: Secondary | ICD-10-CM | POA: Diagnosis not present

## 2021-06-09 DIAGNOSIS — E119 Type 2 diabetes mellitus without complications: Secondary | ICD-10-CM | POA: Diagnosis not present

## 2021-06-09 DIAGNOSIS — F419 Anxiety disorder, unspecified: Secondary | ICD-10-CM

## 2021-06-09 DIAGNOSIS — E1169 Type 2 diabetes mellitus with other specified complication: Secondary | ICD-10-CM

## 2021-06-09 DIAGNOSIS — B009 Herpesviral infection, unspecified: Secondary | ICD-10-CM

## 2021-06-09 MED ORDER — ONETOUCH DELICA PLUS LANCET33G MISC: 5 refills | Status: DC

## 2021-06-09 MED ORDER — ESCITALOPRAM OXALATE 20 MG PO TABS
20.0000 mg | ORAL_TABLET | Freq: Every day | ORAL | 1 refills | Status: DC
Start: 2021-06-09 — End: 2022-01-08

## 2021-06-09 MED ORDER — VALACYCLOVIR HCL 1 G PO TABS: 500.0000 mg | ORAL_TABLET | Freq: Every day | ORAL | 0 refills | Status: DC

## 2021-06-09 NOTE — Progress Notes (Addendum)
Subjective:    Patient ID: [name] [name], female    DOB: May 04, 1961, 60 y.o.   MRN: [id number]  HPI Pt here for medication refill. Pt is requesting refills on all chronic med.   DM2 Patient states that blood sugars are doing well and that she is checking BP once a day. They run about 130s at home throughout the day. Patient taking Metformin. Patient does have concerns with taking Metformin and about her blood sugar dropping. Patient states that she does not eat well and that she can feel when her blood sugar is getting too low. Patient states that she feels shaky and sweaty when she her blood sugar is low. (Patient did not mention a numerical blood sugar level during the times she feels shaky/sweaty). Patient states that she does feel better after drinking orange juice or eating candy. Patient states that she does not like the way she feels on Metformin; she experiences diarrhea and stomach pain. Patient would like to know if she has any other options to take Metformin.   HTN Patient takes BP at home and states that they are well at home. However, BP today 144/84. Patient concerned stating that BP never runs that high at home.   HLD Patient has hx of HLD and taking Lipitor 68m. Patient states tat she is tolerating medication well, but want to know if medication should be changed due to hx of fatty liver.  Anxiety/Depression Patient taking lexapro for depression and Ativan for anxiety/sleep. Patient denies any thoughts of wanting to hurt self or anyone else. 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 Patient takes Zovirax 4050mBID. Patient would like to go on Valtrex daily so she will not have to take two pills a day.    Review of Systems  Constitutional:  Negative for activity change, appetite change, chills, fatigue, fever and unexpected weight change.  HENT:  Negative.    Eyes: Negative.   Respiratory: Negative.    Cardiovascular: Negative.   Gastrointestinal: Negative.   Endocrine: Negative.   Genitourinary: Negative.   Musculoskeletal: Negative.   Skin: Negative.   Allergic/Immunologic: Negative.   Neurological: Negative.   Hematological: Negative.   Psychiatric/Behavioral: Negative.        Objective:   Physical Exam Constitutional:      General: She is not in acute distress.    Appearance: Normal appearance. She is not ill-appearing or toxic-appearing.  HENT:     Head: Normocephalic and atraumatic.  Neck:     Vascular: No carotid bruit.  Cardiovascular:     Rate and Rhythm: Regular rhythm. Bradycardia present.     Pulses: Normal pulses.     Heart sounds: Normal heart sounds. No murmur heard. Pulmonary:     Effort: Pulmonary effort is normal. No respiratory distress.     Breath sounds: Normal breath sounds. No stridor. No wheezing, rhonchi or rales.  Chest:     Chest wall: No tenderness.  Musculoskeletal:        General: No tenderness. Normal range of motion.     Cervical back: Normal range of motion and neck supple. No tenderness.     Right lower leg: No edema.     Left lower leg: No edema.  Lymphadenopathy:     Cervical: No cervical adenopathy.  Skin:    General: Skin is warm.  Neurological:  General: No focal deficit present.     Mental Status: She is alert and oriented to person, place, and time.  Psychiatric:        Mood and Affect: Mood normal.        Behavior: Behavior normal.          Assessment & Plan:   1. Type 2 diabetes mellitus without complication, without long-term current use of insulin (HCC) - Lancets ordered. - HgB A1c - CMP14+EGFR - Micro Albumin/Creatinine Ration - patient currently on Metformin; she has concerns about GI S/E - Will consider possible GLP-1 instead of Metformin for added benefit of weight loss. Will discuss risk-benefit profile at next visit in 3 months. - Loose  weight. Eat lean meat and vegetables. Exercise 54mns a day for 3-5x a week.  - Follow up in 3 months  2. Hyperlipidemia associated with type 2 diabetes mellitus ([place] - CMP14+EGFR - Lipid Profile - Patient educated on the benefits of taking a statin and properly managing HLD to prevent worsening of fatty liver disease.  - Patient agreed to stay on Statin as prescribed. - Will monitor liver and lipid panels for any changes and adjust mediation as appropriate.  - Follow up in 3 months  3. Essential hypertension - Patient taking Atenolol for HTN. Takes medication without difficulty. - HR 55 today and 57 on retake at rest.  - Patient denied any lightheadedness, dizziness, or fatigue on medication. - Due to hx of DM2, will consider changing HTN medication to ARB (valsartan 441m at next visit.  - Reviewed s/s of hypotension. Hold medication if her feel lightheaded, dizzy, or overly fatigued. - Follow up in 3 months  4. Chronic depression - escitalopram (LEXAPRO) 20 MG tablet; Take 1 tablet (20 mg total) by mouth daily.  Dispense: 90 tablet; Refill: 1 - No thoughts of hurting self or anyone else.   5. Herpes simplex - Valtrex 50031maily ordered for HSV suppression. - 500m9mse appropriate due to patient not having any outbreaks in several years. - Advised the patient that if she does have prodromal symptoms of an upcoming outbreak she may start taking 1000mg24may.  - Will assess presence of outbreaks in 3 months  6. Anxiety - Patient takes Ativan for anxiety/sleep. - Will discuss the need to taper down on ativan at next visit.  Addendum-as per discussion with LeonaDocia Barrierfill of lorazepam granted for the next 3 months.  Further discussion on follow-up visit.-ScotSallee Lange

## 2021-06-10 DIAGNOSIS — J3081 Allergic rhinitis due to animal (cat) (dog) hair and dander: Secondary | ICD-10-CM | POA: Diagnosis not present

## 2021-06-10 DIAGNOSIS — J301 Allergic rhinitis due to pollen: Secondary | ICD-10-CM | POA: Diagnosis not present

## 2021-06-10 DIAGNOSIS — J3089 Other allergic rhinitis: Secondary | ICD-10-CM | POA: Diagnosis not present

## 2021-06-10 LAB — LIPID PANEL
Chol/HDL Ratio: 2.8 ratio (ref 0.0–4.4)
Cholesterol, Total: 174 mg/dL (ref 100–199)
HDL: 63 mg/dL (ref >39)
LDL Chol Calc (NIH): 94 mg/dL (ref 0–99)
Triglycerides: 96 mg/dL (ref 0–149)
VLDL Cholesterol Cal: 17 mg/dL (ref 5–40)

## 2021-06-10 LAB — CMP14+EGFR
ALT: 21 IU/L (ref 0–32)
AST: 18 IU/L (ref 0–40)
Albumin/Globulin Ratio: 1.8 (ref 1.2–2.2)
Albumin: 4.4 g/dL (ref 3.8–4.9)
Alkaline Phosphatase: 74 IU/L (ref 44–121)
BUN/Creatinine Ratio: 11 — ABNORMAL LOW (ref 12–28)
BUN: 11 mg/dL (ref 8–27)
Bilirubin Total: 0.3 mg/dL (ref 0.0–1.2)
CO2: 26 mmol/L (ref 20–29)
Calcium: 9.3 mg/dL (ref 8.7–10.3)
Chloride: 105 mmol/L (ref 96–106)
Creatinine, Ser: 1 mg/dL (ref 0.57–1.00)
Globulin, Total: 2.5 g/dL (ref 1.5–4.5)
Glucose: 102 mg/dL — ABNORMAL HIGH (ref 70–99)
Potassium: 4.3 mmol/L (ref 3.5–5.2)
Sodium: 144 mmol/L (ref 134–144)
Total Protein: 6.9 g/dL (ref 6.0–8.5)
eGFR: 64 mL/min/{1.73_m2} (ref >59)

## 2021-06-10 LAB — HEMOGLOBIN A1C
Est. average glucose Bld gHb Est-mCnc: 140 mg/dL
Hgb A1c MFr Bld: 6.5 % — ABNORMAL HIGH (ref 4.8–5.6)

## 2021-06-10 MED ORDER — LORAZEPAM 0.5 MG PO TABS
0.5000 mg | ORAL_TABLET | Freq: Every evening | ORAL | 2 refills | Status: DC | PRN
Start: 2021-06-10 — End: 2021-11-04

## 2021-06-10 NOTE — Addendum Note (Signed)
Addended by: Sallee Lange A on: 06/10/2021 07:43 AM   Modules accepted: Orders

## 2021-06-13 DIAGNOSIS — J301 Allergic rhinitis due to pollen: Secondary | ICD-10-CM | POA: Diagnosis not present

## 2021-06-13 DIAGNOSIS — J3081 Allergic rhinitis due to animal (cat) (dog) hair and dander: Secondary | ICD-10-CM | POA: Diagnosis not present

## 2021-06-13 DIAGNOSIS — J3089 Other allergic rhinitis: Secondary | ICD-10-CM | POA: Diagnosis not present

## 2021-06-17 DIAGNOSIS — J3089 Other allergic rhinitis: Secondary | ICD-10-CM | POA: Diagnosis not present

## 2021-06-17 DIAGNOSIS — J3081 Allergic rhinitis due to animal (cat) (dog) hair and dander: Secondary | ICD-10-CM | POA: Diagnosis not present

## 2021-06-17 DIAGNOSIS — J301 Allergic rhinitis due to pollen: Secondary | ICD-10-CM | POA: Diagnosis not present

## 2021-06-19 DIAGNOSIS — J3081 Allergic rhinitis due to animal (cat) (dog) hair and dander: Secondary | ICD-10-CM | POA: Diagnosis not present

## 2021-06-19 DIAGNOSIS — J301 Allergic rhinitis due to pollen: Secondary | ICD-10-CM | POA: Diagnosis not present

## 2021-06-19 DIAGNOSIS — J3089 Other allergic rhinitis: Secondary | ICD-10-CM | POA: Diagnosis not present

## 2021-06-24 ENCOUNTER — Ambulatory Visit (INDEPENDENT_AMBULATORY_CARE_PROVIDER_SITE_OTHER): Payer: Medicare HMO | Admitting: Nurse Practitioner

## 2021-06-24 ENCOUNTER — Encounter: Payer: Self-pay | Admitting: Nurse Practitioner

## 2021-06-24 ENCOUNTER — Other Ambulatory Visit: Payer: Self-pay

## 2021-06-24 VITALS — BP 150/88 | HR 82 | Temp 98.1°F | Ht 61.0 in | Wt 170.0 lb

## 2021-06-24 DIAGNOSIS — H6123 Impacted cerumen, bilateral: Secondary | ICD-10-CM

## 2021-06-24 DIAGNOSIS — J029 Acute pharyngitis, unspecified: Secondary | ICD-10-CM

## 2021-06-24 LAB — POCT RAPID STREP A (OFFICE): Rapid Strep A Screen: NEGATIVE

## 2021-06-24 NOTE — Progress Notes (Signed)
Subjective:    Patient ID: [name], female    DOB: 1960-08-10, 60 y.o.   MRN: [id number]  HPI  Patient presents to clinic with complaints of sore throat, headaches, body aches, fever, and chills x5 days. Patient states that she then developed a non-productive cough x2 days. Patient states that she feels a little short of breath at times. Patient has used tylenol, thera-flu, and cough drops with some relief.  Denies chest pain, palpitations, ear pain, sinus congestion, wheezing.  Review of Systems  Constitutional:  Positive for chills, fatigue and fever.  HENT:  Positive for congestion, rhinorrhea and sore throat. Negative for drooling, ear pain, facial swelling, hearing loss, postnasal drip, sinus pressure, sinus pain, sneezing and trouble swallowing.   Respiratory:  Positive for cough and shortness of breath. Negative for apnea, choking, chest tightness and wheezing.   Cardiovascular:  Negative for chest pain, palpitations and leg swelling.  Gastrointestinal:  Negative for constipation, diarrhea, nausea and vomiting.  Neurological:  Positive for headaches. Negative for weakness.      Objective:   Physical Exam Constitutional:      General: She is not in acute distress.    Appearance: Normal appearance. She is normal weight. She is not ill-appearing or toxic-appearing.  HENT:     Head: Normocephalic.     Right Ear: There is impacted cerumen.     Left Ear: There is impacted cerumen.     Nose: Mucosal edema, congestion and rhinorrhea present. No nasal deformity, septal deviation, signs of injury, laceration or nasal tenderness. Rhinorrhea is clear.     Right Nostril: No foreign body, epistaxis, septal hematoma or occlusion.     Left Nostril: No foreign body, epistaxis, septal hematoma or occlusion.     Right Turbinates: Not enlarged, swollen or pale.     Left Turbinates: Enlarged. Not swollen or pale.     Right Sinus: No maxillary sinus tenderness or frontal sinus  tenderness.     Left Sinus: No maxillary sinus tenderness or frontal sinus tenderness.     Mouth/Throat:     Mouth: Mucous membranes are moist.     Pharynx: No oropharyngeal exudate or posterior oropharyngeal erythema.  Eyes:     Conjunctiva/sclera: Conjunctivae normal.     Pupils: Pupils are equal, round, and reactive to light.  Neck:     Comments: Left and right superficial cervical lymph node tenderness to palpation Left and right posterior cervical lymph node tenderness to palpation Cardiovascular:     Rate and Rhythm: Normal rate and regular rhythm.     Pulses: Normal pulses.     Heart sounds: Normal heart sounds. No murmur heard. Pulmonary:     Effort: Pulmonary effort is normal. No respiratory distress.     Breath sounds: Normal breath sounds. No stridor. No wheezing, rhonchi or rales.  Chest:     Chest wall: No tenderness.  Abdominal:     General: Abdomen is flat.     Palpations: Abdomen is soft.  Musculoskeletal:        General: Normal range of motion.  Lymphadenopathy:     Cervical: No cervical adenopathy.     Right cervical: No superficial, deep or posterior cervical adenopathy.    Left cervical: No superficial, deep or posterior cervical adenopathy.  Skin:    General: Skin is warm.     Capillary Refill: Capillary refill takes less than 2 seconds.  Neurological:     General: No focal deficit present.  Mental Status: She is alert and oriented to person, place, and time.  Psychiatric:        Mood and Affect: Mood normal.        Behavior: Behavior normal.          Assessment & Plan:  1. Sore throat - Likely viral upper respiratory illness; however patient has hx of strep. Strep etiology investigated.  - COVID-19, Flu A+B and RSV - Culture, Group A Strep - POCT rapid strep A, negative today - May continue to use OTC tylenol, ibuprofen, cough drops for symptom relief - May use neti pot or other nasal lavage for symptoms relief - If not better in 5 days RTC  to be re-evaluated or go to nearest Urgent Care.  - Go to Urgent Care or ED if you develop significant SOB   2. Impacted cerumen of both ears - Continue to use OTC debrox - If not resolved with debrox may consider ENT referral

## 2021-06-25 LAB — COVID-19, FLU A+B AND RSV
Influenza A, NAA: NOT DETECTED
Influenza B, NAA: NOT DETECTED
RSV, NAA: NOT DETECTED
SARS-CoV-2, NAA: NOT DETECTED

## 2021-06-26 NOTE — Progress Notes (Signed)
Hello Mrs. [name] [name],  Your Flu, COVID, and RSV test was negative. It is possible that you have a viral infection that should start to get better within 3-5 days. However, your strept test is still pending. I will contact you again, once I have the results of your strep test. If you do not start to feel better. Please feel free to RTC to be re-evaluated. Merry Christmas!  Barbee Shropshire

## 2021-06-27 LAB — CULTURE, GROUP A STREP: Strep A Culture: NEGATIVE

## 2021-06-28 NOTE — Progress Notes (Signed)
Hello Rayya,  Your Strep test was negative. I still assume you had a viral illness. I hope you are starting to feel better. If you are not feeling better, please feel free to be seen at the Urgent Care or return to the clinic to be re-evaluated. I hope you have a very Merry Christmas!  Barbee Shropshire

## 2021-07-01 DIAGNOSIS — J3089 Other allergic rhinitis: Secondary | ICD-10-CM | POA: Diagnosis not present

## 2021-07-01 DIAGNOSIS — J301 Allergic rhinitis due to pollen: Secondary | ICD-10-CM | POA: Diagnosis not present

## 2021-07-01 DIAGNOSIS — J3081 Allergic rhinitis due to animal (cat) (dog) hair and dander: Secondary | ICD-10-CM | POA: Diagnosis not present

## 2021-07-03 DIAGNOSIS — J3089 Other allergic rhinitis: Secondary | ICD-10-CM | POA: Diagnosis not present

## 2021-07-03 DIAGNOSIS — J301 Allergic rhinitis due to pollen: Secondary | ICD-10-CM | POA: Diagnosis not present

## 2021-07-03 DIAGNOSIS — J3081 Allergic rhinitis due to animal (cat) (dog) hair and dander: Secondary | ICD-10-CM | POA: Diagnosis not present

## 2021-07-06 ENCOUNTER — Other Ambulatory Visit: Payer: Self-pay | Admitting: Internal Medicine

## 2021-07-09 DIAGNOSIS — J3081 Allergic rhinitis due to animal (cat) (dog) hair and dander: Secondary | ICD-10-CM | POA: Diagnosis not present

## 2021-07-09 DIAGNOSIS — J3089 Other allergic rhinitis: Secondary | ICD-10-CM | POA: Diagnosis not present

## 2021-07-09 DIAGNOSIS — J301 Allergic rhinitis due to pollen: Secondary | ICD-10-CM | POA: Diagnosis not present

## 2021-07-11 DIAGNOSIS — J3081 Allergic rhinitis due to animal (cat) (dog) hair and dander: Secondary | ICD-10-CM | POA: Diagnosis not present

## 2021-07-11 DIAGNOSIS — J3089 Other allergic rhinitis: Secondary | ICD-10-CM | POA: Diagnosis not present

## 2021-07-11 DIAGNOSIS — J301 Allergic rhinitis due to pollen: Secondary | ICD-10-CM | POA: Diagnosis not present

## 2021-07-15 ENCOUNTER — Ambulatory Visit (INDEPENDENT_AMBULATORY_CARE_PROVIDER_SITE_OTHER): Payer: Medicare HMO

## 2021-07-15 ENCOUNTER — Other Ambulatory Visit: Payer: Self-pay

## 2021-07-15 VITALS — Ht 61.0 in | Wt 170.0 lb

## 2021-07-15 DIAGNOSIS — J309 Allergic rhinitis, unspecified: Secondary | ICD-10-CM | POA: Insufficient documentation

## 2021-07-15 DIAGNOSIS — Z0000 Encounter for general adult medical examination without abnormal findings: Secondary | ICD-10-CM

## 2021-07-15 DIAGNOSIS — T783XXA Angioneurotic edema, initial encounter: Secondary | ICD-10-CM | POA: Insufficient documentation

## 2021-07-15 NOTE — Progress Notes (Addendum)
Subjective:   [name] [name] is a 61 y.o. female who presents for an Initial Medicare Annual Wellness Visit. Virtual Visit via Telephone Note  I connected with  [name] [name] on 07/15/21 at  3:00 PM EST by telephone and verified that I am speaking with the correct person using two identifiers.  Location: Patient: Home Provider: [facility] Persons participating in the virtual visit: patient/Nurse Health Advisor   I discussed the limitations, risks, security and privacy concerns of performing an evaluation and management service by telephone and the availability of in person appointments. The patient expressed understanding and agreed to proceed.  Interactive audio and video telecommunications were attempted between this nurse and patient, however failed, due to patient having technical difficulties OR patient did not have access to video capability.  We continued and completed visit with audio only.  Some vital signs may be absent or patient reported.   Chriss Driver, LPN  Review of Systems     Cardiac Risk Factors include: diabetes mellitus;hypertension;dyslipidemia;sedentary lifestyle;obesity (BMI >30kg/m2)  PHONE VISIT. PT AT HOME NURSE AT [facility]    Objective:    Today's Vitals   07/15/21 1515  Weight: 170 lb (77.1 kg)  Height: 5' 1" (1.549 m)   Body mass index is 32.12 kg/m.  Advanced Directives 07/15/2021 01/14/2021 12/16/2020 01/23/2020 09/04/2019 08/28/2019 03/14/2019  Does Patient Have a Medical Advance Directive? _0  No No  Would patient like information on creating a medical advance directive? No - Patient declined No - Patient declined - - No - Patient declined No - Patient declined No - Patient declined  Pre-existing out of facility DNR order (yellow form or pink MOST form) - - - - - - -    Current Medications (verified) Outpatient Encounter Medications as of 07/15/2021  Medication Sig   atenolol (TENORMIN) 50 MG tablet Take 1 tablet (50 mg total) by  mouth 2 (two) times daily.   atorvastatin (LIPITOR) 20 MG tablet Take 1 tablet (20 mg total) by mouth daily at 6 PM.   blood glucose meter kit and supplies KIT Dispense based on patient and insurance preference. Use up to four times daily as directed.   blood glucose meter kit and supplies Dispense based on patient and insurance preference. Use up to test glucose once daily. For ICD -10 E11.9.   diphenhydramine-acetaminophen (TYLENOL PM) 25-500 MG TABS tablet Take 1 tablet by mouth at bedtime as needed (sleep).   EPINEPHrine 0.3 mg/0.3 mL IJ SOAJ injection Inject 0.3 mg into the muscle as needed for anaphylaxis.    escitalopram (LEXAPRO) 20 MG tablet Take 1 tablet (20 mg total) by mouth daily.   famotidine (PEPCID) 40 MG tablet Take 1 tablet (40 mg total) by mouth at bedtime.   FLUCELVAX QUADRIVALENT 0.5 ML injection    glucose blood (ONETOUCH ULTRA) test strip USE TO CHECK BLOOD SUGAR THREE TIMES A DAY   Lancets (ONETOUCH DELICA PLUS [id number]) MISC USE TO TEST BLOOD GLUCOSE DAILY   levocetirizine (XYZAL) 5 MG tablet Take 5 mg by mouth every evening.   LORazepam (ATIVAN) 0.5 MG tablet Take 1 tablet (0.5 mg total) by mouth at bedtime as needed for sleep.   Melatonin 10 MG TABS Take 10 mg by mouth at bedtime.   metFORMIN (GLUCOPHAGE-XR) 500 MG 24 hr tablet TAKE 1 TABLET BY MOUTH TWICE A DAY WITH MEALS (Patient taking differently: 500 mg daily.)   methocarbamol (ROBAXIN) 500 MG tablet Take 500 mg by mouth  daily as needed for muscle spasms.   MODERNA COVID-19 BIVAL BOOSTER 50 MCG/0.5ML injection    pantoprazole (PROTONIX) 40 MG tablet Take 1 tablet (40 mg total) by mouth 2 (two) times daily before a meal.   valACYclovir (VALTREX) 1000 MG tablet Take 0.5 tablets (500 mg total) by mouth daily.   [DISCONTINUED] levocetirizine (XYZAL) 5 MG tablet 1 tablet in the evening   [DISCONTINUED] potassium chloride SA (K-DUR,KLOR-CON) 20 MEQ tablet Take 1 tablet (20 mEq total) by mouth 2 (two) times daily.   No  facility-administered encounter medications on file as of 07/15/2021.    Allergies (verified) Doxycycline   History: Past Medical History:  Diagnosis Date   Allergic rhinitis    Anemia, unspecified 05/30/2013   Anxiety    Breast cancer ([place]) 03/31/13   left   Cancer ([place])    Chest pain 2009   Consultation-[facility], negative chest CT; nl echo in 2005; h/o palpitations   Chiari malformation type I ([place])    s/p suboccipital craniectomy, C1 laminectomy, superior C2 laminectomy, duraplasty 05/10/14   Colitis 2010   not IBD   Degenerative joint disease    + degenerative joint disease of the lumbosacral spine   Depression    Diabetes ([place])    Diabetes ([place])    Dysrhythmia    palpations   GERD (gastroesophageal reflux disease)    "a little"   Headache    Heart murmur    "small"   Herpes simplex type II infection    Hypercholesteremia    "slightly high"   Hypertension    does not have high blood pressure   Meningitis 08/22/2014   Palpitations    Personal history of chemotherapy    Personal history of radiation therapy    Wears glasses    Past Surgical History:  Procedure Laterality Date   ABDOMINAL HYSTERECTOMY  03/13/2007   TAH ?BSO--Dr Levin Bacon, [employer]   BIOPSY  01/14/2021   Procedure: BIOPSY;  Surgeon: Eloise Harman, DO;  [facility];  Service: Endoscopy;;   BREAST EXCISIONAL BIOPSY  04/2003   Left; benign disease   BREAST LUMPECTOMY Left 09/2013   BREAST LUMPECTOMY WITH NEEDLE LOCALIZATION AND AXILLARY LYMPH NODE DISSECTION Left 09/12/2013   Procedure: BREAST LUMPECTOMY WITH NEEDLE LOCALIZATION AND AXILLARY LYMPH NODE DISSECTION;  Surgeon: [name], MD;  Location: [facility];  Service: General;  Laterality: Left;  and axilla   BREAST SURGERY     COLONOSCOPY  10/2010   proctitis; melanosis coli   COLONOSCOPY WITH PROPOFOL N/A 01/04/2018   Dr. [name]: hemorrhoids, follow up colonoscopy in 10 years   EAR CYST EXCISION Right 09/04/2019    Procedure: EXCISION EAR MASS;  Surgeon: [name], MD;  Location: [place];  Service: ENT;  Laterality: Right;   ESOPHAGOGASTRODUODENOSCOPY (EGD) WITH PROPOFOL N/A 01/14/2021   Procedure: ESOPHAGOGASTRODUODENOSCOPY (EGD) WITH PROPOFOL;  Surgeon: Eloise Harman, DO;  [facility];  Service: Endoscopy;  Laterality: N/A;  9:00am   EXCISION/RELEASE BURSA HIP Left 03/17/2019   Procedure: Left hip open bursectomy;  Surgeon: Nicholes Stairs, MD;  Location: [facility];  Service: Orthopedics;  Laterality: Left;  75 mins   EYE SURGERY Left    "fix lazy eye"   PORTACATH PLACEMENT Right 04/21/2013   Procedure: INSERTION PORT-A-CATH;  Surgeon: [name], MD;  [facility];  Service: General;  Laterality: Right;   RIGHT OOPHORECTOMY  03/13/2007   SUBOCCIPITAL CRANIECTOMY CERVICAL LAMINECTOMY N/A 05/10/2014  Procedure: SUBOCCIPITAL CRANIECTOMY CERVICAL LAMINECTOMY/DURAPLASTY;  Surgeon: [name], MD;  Location: [facility] [facility];  Service: Neurosurgery;  Laterality: N/A;  suboccipital craniectomy with cervical laminectomy and duraplasty   TUBAL LIGATION     Family History  Problem Relation Age of Onset   Aneurysm Mother        Cerebral   Hypertension Mother    Hyperlipidemia Mother    Stroke Mother    Coronary artery disease Father    Hypertension Father    Hyperlipidemia Father    Heart attack Father        d. 25   Lung cancer Father        dx early 7s; former smoker   Lung cancer Brother    Lung cancer Brother        d. 68y; smoker   Lung cancer Sister        paternal half-sister; not a smoker; dx <60; d. 45y   Leukemia Maternal Aunt        "blood cancer"; d. unspecified age   Cervical cancer Paternal Aunt        d. late 72s   Breast cancer Sister 71   Other Sister        hx of hysterectomy for unspecified reason   Multiple myeloma Brother        d. 61s   Lung cancer Brother        dx late 66s; smoker   Prostate cancer Brother        dx late 41s    Prostate cancer Brother        dx late 52s   Lung cancer Brother    Lung cancer Maternal Aunt        d. unspecified age   Breast cancer Maternal Aunt        dx unspecified age   Prostate cancer Cousin        maternal 1st cousin; dx older age   Breast cancer Other        niece dx early 58s or before   Leukemia Other        nephew d. 2y; "blood cancer"   Colon cancer Neg Hx    Diabetes Neg Hx    Social History   Socioeconomic History   Marital status: Married    Spouse name: Fritz Pickerel   Number of children: 4   Years of education: Not on file   Highest education level: Not on file  Occupational History   Occupation: [employer] work    Fish farm manager: Engineer, [employer]  Tobacco Use   Smoking status: Former    Packs/day: 0.50    Years: 15.00    Pack years: 7.50    Types: Cigarettes    Quit date: 07/06/1982    Years since quitting: 39.0   Smokeless tobacco: Never  Vaping Use   Vaping Use: Never used  Substance and Sexual Activity   Alcohol use: No   Drug use: No   Sexual activity: Yes    Partners: Male    Birth control/protection: Surgical    Comment: TAH  Other Topics Concern   Not on file  Social History Narrative   4 biological children   4 step children   13 grandchildren    1 great grandchildren   2 foster children   Social Determinants of Health   Financial Resource Strain: Low Risk    Difficulty of Paying Living Expenses: Not hard at all  Food Insecurity: No Food Insecurity  Worried About Charity fundraiser in the Last Year: Never true   [place] in the Last Year: Never true  Transportation Needs: No Transportation Needs   Lack of Transportation (Medical): No   Lack of Transportation (Non-Medical): No  Physical Activity: Insufficiently Active   Days of Exercise per Week: 2 days   Minutes of Exercise per Session: 30 min  Stress: No Stress Concern Present   Feeling of Stress : Not at all  Social Connections: Socially Integrated   Frequency of  Communication with Friends and Family: More than three times a week   Frequency of Social Gatherings with Friends and Family: More than three times a week   Attends Religious Services: More than 4 times per year   Active Member of [employer] or Organizations: Yes   Attends Music therapist: More than 4 times per year   Marital Status: Married    Tobacco Counseling Counseling given: Not Answered   Clinical Intake:  Pre-visit preparation completed: Yes  Pain : No/denies pain     BMI - recorded: 32.12 Nutritional Status: BMI > 30  Obese Nutritional Risks: None Diabetes: Yes  How often do you need to have someone help you when you read instructions, pamphlets, or other written materials from your doctor or pharmacy?: 1 - Never  Diabetic?Nutrition Risk Assessment:  Has the patient had any N/V/D within the last 2 months?  No  Does the patient have any non-healing wounds?  No  Has the patient had any unintentional weight loss or weight gain?  No   Diabetes:  Is the patient diabetic?  Yes  If diabetic, was a CBG obtained today?  No  Did the patient bring in their glucometer from home?  No  How often do you monitor your CBG's? Twice per week.   Financial Strains and Diabetes Management:  Are you having any financial strains with the device, your supplies or your medication? No .  Does the patient want to be seen by Chronic Care Management for management of their diabetes?  No  Would the patient like to be referred to a Nutritionist or for Diabetic Management?  No   Diabetic Exams:  Diabetic Eye Exam: Completed 2022.Pt has been advised about the importance in completing this exam.  Diabetic Foot Exam: Completed 02/09/2020. Pt has been advised about the importance in completing this exam.   Interpreter Needed?: No  Information entered by :: MJ , LPN   Activities of Daily Living In your present state of health, do you have any difficulty performing the  following activities: 07/15/2021  Hearing? N  Vision? N  Difficulty concentrating or making decisions? N  Walking or climbing stairs? N  Dressing or bathing? N  Doing errands, shopping? N  Preparing Food and eating ? N  Using the Toilet? N  In the past six months, have you accidently leaked urine? N  Do you have problems with loss of bowel control? N  Managing your Medications? N  Managing your Finances? N  Housekeeping or managing your Housekeeping? N  Some recent data might be hidden    Patient Care Team: Ameduite, Trenton Gammon, NP as PCP - General (Nurse Practitioner) Magrinat, Virgie Dad, MD as Consulting Physician (Oncology) Nicholes Stairs, MD as Consulting Physician (Orthopedic Surgery) Nilda Simmer, NP (Family Medicine) Eloise Harman, DO as Consulting Physician (Gastroenterology)  Indicate any recent Medical Services you may have received from other than [facility] providers in the past year (  date may be approximate).     Assessment:   This is a routine wellness examination for Zoeya.  Hearing/Vision screen Hearing Screening - Comments:: No hearing issues.  Vision Screening - Comments:: Glasses. [facility] in [place]. 2022.  Dietary issues and exercise activities discussed: Current Exercise Habits: Structured exercise class, Type of exercise: walking, Time (Minutes): 30, Frequency (Times/Week): 2, Weekly Exercise (Minutes/Week): 60, Intensity: Mild, Exercise limited by: cardiac condition(s)   Goals Addressed             This Visit's Progress    Exercise 3x per week (30 min per time)       Increase exercise as tolerated.        Depression Screen PHQ 2/9 Scores 07/15/2021 12/23/2020 10/03/2020 08/23/2020 08/01/2020 02/09/2020 06/20/2018  PHQ - 2 Score 0 0 0 _0 PHQ- 9 Score - - - _1 Fall Risk Fall Risk  07/15/2021 12/06/2020 10/03/2020 08/26/2018 06/20/2018  Falls in the past year? 0 0 0 0 0  Number falls in past yr: 0 0 0 - 0  Injury with  Fall? 0 0 0 - 0  Risk for fall due to : No Fall Risks No Fall Risks - - -  Follow up Falls prevention discussed Falls evaluation completed - - Falls evaluation completed    [place]:  Any stairs in or around the home? Yes  If so, are there any without handrails? No  Home free of loose throw rugs in walkways, pet beds, electrical cords, etc? Yes  Adequate lighting in your home to reduce risk of falls? Yes   ASSISTIVE DEVICES UTILIZED TO PREVENT FALLS:  Life alert? No  Use of a cane, walker or w/c? No  Grab bars in the bathroom? No  Shower chair or bench in shower? No  Elevated toilet seat or a handicapped toilet? No   TIMED UP AND GO:  Was the test performed? No .  Phone visit.  Cognitive Function:     6CIT Screen 07/15/2021  What Year? 0 points  What month? 0 points  What time? 0 points  Count back from 20 0 points  Months in reverse 0 points  Repeat phrase 0 points  Total Score 0    Immunizations Immunization History  Administered Date(s) Administered   Influenza, Quadrivalent, Recombinant, Inj, Pf 05/02/2019   Influenza,inj,Quad PF,6+ Mos 05/20/2018   Influenza-Unspecified 07/06/1997, 04/05/2017, 04/11/2021   Moderna SARS-COV2 Booster Vaccination 05/23/2020, 04/11/2021   Moderna Sars-Covid-2 Vaccination 09/28/2019, 10/12/2019, 11/23/2019, 04/12/2020   Pneumococcal Polysaccharide-23 10/18/2017    TDAP status: Due, Education has been provided regarding the importance of this vaccine. Advised may receive this vaccine at local pharmacy or Health Dept. Aware to provide a copy of the vaccination record if obtained from local pharmacy or Health Dept. Verbalized acceptance and understanding.  Flu Vaccine status: Up to date  Pneumococcal vaccine status: Up to date  Covid-19 vaccine status: Completed vaccines  Qualifies for Shingles Vaccine? Yes   Zostavax completed No   Shingrix Completed?: No.    Education has been provided  regarding the importance of this vaccine. Patient has been advised to call insurance company to determine out of pocket expense if they have not yet received this vaccine. Advised may also receive vaccine at local pharmacy or Health Dept. Verbalized acceptance and understanding.  Screening Tests Health Maintenance  Topic Date Due   Hepatitis C Screening  Never done  TETANUS/TDAP  Never done   Zoster Vaccines- Shingrix (1 of 2) Never done   OPHTHALMOLOGY EXAM  12/26/2020   FOOT EXAM  02/08/2021   COVID-19 Vaccine (5 - Booster) 06/06/2021   URINE MICROALBUMIN  07/31/2021   Pneumococcal Vaccine 27-57 Years old (2 - PCV) 12/07/2021 (Originally 10/19/2018)   HEMOGLOBIN A1C  12/08/2021   MAMMOGRAM  05/15/2023   COLONOSCOPY (Pts 45-32yr Insurance coverage will need to be confirmed)  01/05/2028   INFLUENZA VACCINE  Completed   HIV Screening  Completed   HPV VACCINES  Aged Out    Health Maintenance  Health Maintenance Due  Topic Date Due   Hepatitis C Screening  Never done   TETANUS/TDAP  Never done   Zoster Vaccines- Shingrix (1 of 2) Never done   OPHTHALMOLOGY EXAM  12/26/2020   FOOT EXAM  02/08/2021   COVID-19 Vaccine (5 - Booster) 06/06/2021   URINE MICROALBUMIN  07/31/2021    Colorectal cancer screening: Type of screening: Colonoscopy. Completed 01/04/2018. Repeat every 10 years  Mammogram status: Completed 05/14/2021. Repeat every year  Bone Density status: Ordered Not due until age 61.Marland KitchenPt provided with contact info and advised to call to schedule appt.  Lung Cancer Screening: (Low Dose CT Chest recommended if Age 61-80years, 30 pack-year currently smoking OR have quit w/in 15years.) does not qualify.    Additional Screening:  Hepatitis C Screening: does not qualify;   Vision Screening: Recommended annual ophthalmology exams for early detection of glaucoma and other disorders of the eye. Is the patient up to date with their annual eye exam?  Yes  Who is the provider or  what is the name of the office in which the patient attends annual eye exams? Dr. CJorja Loa RLinna Hoff If pt is not established with a provider, would they like to be referred to a provider to establish care? No .   Dental Screening: Recommended annual dental exams for proper oral hygiene  Community Resource Referral / Chronic Care Management: CRR required this visit?  No   CCM required this visit?  No      Plan:     I have personally reviewed and noted the following in the patients chart:   Medical and social history Use of alcohol, tobacco or illicit drugs  Current medications and supplements including opioid prescriptions. Patient is not currently taking opioid prescriptions. Functional ability and status Nutritional status Physical activity Advanced directives List of other physicians Hospitalizations, surgeries, and ER visits in previous 12 months Vitals Screenings to include cognitive, depression, and falls Referrals and appointments  In addition, I have reviewed and discussed with patient certain preventive protocols, quality metrics, and best practice recommendations. A written personalized care plan for preventive services as well as general preventive health recommendations were provided to patient.     MChriss Driver LPN   15/28/4132  Nurse Notes: Pt is up to date on all age appropriate health maintenance. Discussed shingrix and how to obtain.   I have reviewed and agree with the above visit documentation LMariane Baumgarten FNP-BC [facility]

## 2021-07-15 NOTE — Patient Instructions (Signed)
[name] [name] , Thank you for taking time to come for your Medicare Wellness Visit. I appreciate your ongoing commitment to your health goals. Please review the following plan we discussed and let me know if I can assist you in the future.   Screening recommendations/referrals: Colonoscopy: Done 01/04/2018 Repeat in 10 years  Mammogram: Done 05/14/2021. Repeat annually  Bone Density: Due at age 61.  Recommended yearly ophthalmology/optometry visit for glaucoma screening and checkup Recommended yearly dental visit for hygiene and checkup  Vaccinations: Influenza vaccine: Done 04/11/2021 Repeat annually  Pneumococcal vaccine: Done 10/18/2017. Second dose due at age 75. Tdap vaccine: Due Repeat in 10 years  Shingles vaccine: Shingrix discussed. Please contact your pharmacy for coverage information.    Covid-19: Done 09/28/2019, 10/12/2019, 11/23/2019, 04/12/2020, 05/23/2020 and 04/11/2021.  Advanced directives: Advance directive discussed with you today. Even though you declined this today, please call our office should you change your mind, and we can give you the proper paperwork for you to fill out.   Conditions/risks identified: Aim for 30 minutes of exercise or brisk walking each day, drink 6-8 glasses of water and eat lots of fruits and vegetables.   Next appointment: Follow up in one year for your annual wellness visit. 2024.  Preventive Care 40-64 Years, Female Preventive care refers to lifestyle choices and visits with your health care provider that can promote health and wellness. What does preventive care include? A yearly physical exam. This is also called an annual well check. Dental exams once or twice a year. Routine eye exams. Ask your health care provider how often you should have your eyes checked. Personal lifestyle choices, including: Daily care of your teeth and gums. Regular physical activity. Eating a healthy diet. Avoiding tobacco and drug use. Limiting alcohol  use. Practicing safe sex. Taking low-dose aspirin daily starting at age 75. Taking vitamin and mineral supplements as recommended by your health care provider. What happens during an annual well check? The services and screenings done by your health care provider during your annual well check will depend on your age, overall health, lifestyle risk factors, and family history of disease. Counseling  Your health care provider may ask you questions about your: Alcohol use. Tobacco use. Drug use. Emotional well-being. Home and relationship well-being. Sexual activity. Eating habits. Work and work Statistician. Method of birth control. Menstrual cycle. Pregnancy history. Screening  You may have the following tests or measurements: Height, weight, and BMI. Blood pressure. Lipid and cholesterol levels. These may be checked every 5 years, or more frequently if you are over 6 years old. Skin check. Lung cancer screening. You may have this screening every year starting at age 73 if you have a 30-pack-year history of smoking and currently smoke or have quit within the past 15 years. Fecal occult blood test (FOBT) of the stool. You may have this test every year starting at age 41. Flexible sigmoidoscopy or colonoscopy. You may have a sigmoidoscopy every 5 years or a colonoscopy every 10 years starting at age 71. Hepatitis C blood test. Hepatitis B blood test. Sexually transmitted disease (STD) testing. Diabetes screening. This is done by checking your blood sugar (glucose) after you have not eaten for a while (fasting). You may have this done every 1-3 years. Mammogram. This may be done every 1-2 years. Talk to your health care provider about when you should start having regular mammograms. This may depend on whether you have a family history of breast cancer. BRCA-related cancer screening. This may  be done if you have a family history of breast, ovarian, tubal, or peritoneal cancers. Pelvic  exam and Pap test. This may be done every 3 years starting at age 29. Starting at age 42, this may be done every 5 years if you have a Pap test in combination with an HPV test. Bone density scan. This is done to screen for osteoporosis. You may have this scan if you are at high risk for osteoporosis. Discuss your test results, treatment options, and if necessary, the need for more tests with your health care provider. Vaccines  Your health care provider may recommend certain vaccines, such as: Influenza vaccine. This is recommended every year. Tetanus, diphtheria, and acellular pertussis (Tdap, Td) vaccine. You may need a Td booster every 10 years. Zoster vaccine. You may need this after age 61. Pneumococcal 13-valent conjugate (PCV13) vaccine. You may need this if you have certain conditions and were not previously vaccinated. Pneumococcal polysaccharide (PPSV23) vaccine. You may need one or two doses if you smoke cigarettes or if you have certain conditions. Talk to your health care provider about which screenings and vaccines you need and how often you need them. This information is not intended to replace advice given to you by your health care provider. Make sure you discuss any questions you have with your health care provider. Document Released: 07/19/2015 Document Revised: 03/11/2016 Document Reviewed: 04/23/2015 Elsevier Interactive Patient Education  2017 [employer] Prevention in the Home Falls can cause injuries. They can happen to people of all ages. There are many things you can do to make your home safe and to help prevent falls. What can I do on the outside of my home? Regularly fix the edges of walkways and driveways and fix any cracks. Remove anything that might make you trip as you walk through a door, such as a raised step or threshold. Trim any bushes or trees on the path to your home. Use bright outdoor lighting. Clear any walking paths of anything that might  make someone trip, such as rocks or tools. Regularly check to see if handrails are loose or broken. Make sure that both sides of any steps have handrails. Any raised decks and porches should have guardrails on the edges. Have any leaves, snow, or ice cleared regularly. Use sand or salt on walking paths during winter. Clean up any spills in your garage right away. This includes oil or grease spills. What can I do in the bathroom? Use night lights. Install grab bars by the toilet and in the tub and shower. Do not use towel bars as grab bars. Use non-skid mats or decals in the tub or shower. If you need to sit down in the shower, use a plastic, non-slip stool. Keep the floor dry. Clean up any water that spills on the floor as soon as it happens. Remove soap buildup in the tub or shower regularly. Attach bath mats securely with double-sided non-slip rug tape. Do not have throw rugs and other things on the floor that can make you trip. What can I do in the bedroom? Use night lights. Make sure that you have a light by your bed that is easy to reach. Do not use any sheets or blankets that are too big for your bed. They should not hang down onto the floor. Have a firm chair that has side arms. You can use this for support while you get dressed. Do not have throw rugs and other things  on the floor that can make you trip. What can I do in the kitchen? Clean up any spills right away. Avoid walking on wet floors. Keep items that you use a lot in easy-to-reach places. If you need to reach something above you, use a strong step stool that has a grab bar. Keep electrical cords out of the way. Do not use floor polish or wax that makes floors slippery. If you must use wax, use non-skid floor wax. Do not have throw rugs and other things on the floor that can make you trip. What can I do with my stairs? Do not leave any items on the stairs. Make sure that there are handrails on both sides of the stairs  and use them. Fix handrails that are broken or loose. Make sure that handrails are as long as the stairways. Check any carpeting to make sure that it is firmly attached to the stairs. Fix any carpet that is loose or worn. Avoid having throw rugs at the top or bottom of the stairs. If you do have throw rugs, attach them to the floor with carpet tape. Make sure that you have a light switch at the top of the stairs and the bottom of the stairs. If you do not have them, ask someone to add them for you. What else can I do to help prevent falls? Wear shoes that: Do not have high heels. Have rubber bottoms. Are comfortable and fit you well. Are closed at the toe. Do not wear sandals. If you use a stepladder: Make sure that it is fully opened. Do not climb a closed stepladder. Make sure that both sides of the stepladder are locked into place. Ask someone to hold it for you, if possible. Clearly mark and make sure that you can see: Any grab bars or handrails. First and last steps. Where the edge of each step is. Use tools that help you move around (mobility aids) if they are needed. These include: Canes. Walkers. Scooters. Crutches. Turn on the lights when you go into a dark area. Replace any light bulbs as soon as they burn out. Set up your furniture so you have a clear path. Avoid moving your furniture around. If any of your floors are uneven, fix them. If there are any pets around you, be aware of where they are. Review your medicines with your doctor. Some medicines can make you feel dizzy. This can increase your chance of falling. Ask your doctor what other things that you can do to help prevent falls. This information is not intended to replace advice given to you by your health care provider. Make sure you discuss any questions you have with your health care provider. Document Released: 04/18/2009 Document Revised: 11/28/2015 Document Reviewed: 07/27/2014 Elsevier Interactive Patient  Education  2017 [employer].

## 2021-07-16 DIAGNOSIS — J301 Allergic rhinitis due to pollen: Secondary | ICD-10-CM | POA: Diagnosis not present

## 2021-07-16 DIAGNOSIS — J3081 Allergic rhinitis due to animal (cat) (dog) hair and dander: Secondary | ICD-10-CM | POA: Diagnosis not present

## 2021-07-16 DIAGNOSIS — J3089 Other allergic rhinitis: Secondary | ICD-10-CM | POA: Diagnosis not present

## 2021-07-18 DIAGNOSIS — J3081 Allergic rhinitis due to animal (cat) (dog) hair and dander: Secondary | ICD-10-CM | POA: Diagnosis not present

## 2021-07-18 DIAGNOSIS — J301 Allergic rhinitis due to pollen: Secondary | ICD-10-CM | POA: Diagnosis not present

## 2021-07-18 DIAGNOSIS — J3089 Other allergic rhinitis: Secondary | ICD-10-CM | POA: Diagnosis not present

## 2021-07-22 DIAGNOSIS — J301 Allergic rhinitis due to pollen: Secondary | ICD-10-CM | POA: Diagnosis not present

## 2021-07-22 DIAGNOSIS — J3081 Allergic rhinitis due to animal (cat) (dog) hair and dander: Secondary | ICD-10-CM | POA: Diagnosis not present

## 2021-07-22 DIAGNOSIS — J3089 Other allergic rhinitis: Secondary | ICD-10-CM | POA: Diagnosis not present

## 2021-07-23 NOTE — Telephone Encounter (Signed)
Sent message to schedule appointment again 07/23/21

## 2021-07-30 ENCOUNTER — Other Ambulatory Visit: Payer: Self-pay | Admitting: Family Medicine

## 2021-07-30 ENCOUNTER — Encounter: Payer: Self-pay | Admitting: Internal Medicine

## 2021-07-30 ENCOUNTER — Other Ambulatory Visit: Payer: Self-pay | Admitting: Nurse Practitioner

## 2021-07-31 ENCOUNTER — Other Ambulatory Visit: Payer: Self-pay | Admitting: Nurse Practitioner

## 2021-07-31 MED ORDER — VALACYCLOVIR HCL 1 G PO TABS: 500.0000 mg | ORAL_TABLET | Freq: Every day | ORAL | 3 refills | Status: DC

## 2021-07-31 NOTE — Telephone Encounter (Signed)
Has appointment 09/08/21

## 2021-08-01 DIAGNOSIS — J3081 Allergic rhinitis due to animal (cat) (dog) hair and dander: Secondary | ICD-10-CM | POA: Diagnosis not present

## 2021-08-01 DIAGNOSIS — J3089 Other allergic rhinitis: Secondary | ICD-10-CM | POA: Diagnosis not present

## 2021-08-01 DIAGNOSIS — J301 Allergic rhinitis due to pollen: Secondary | ICD-10-CM | POA: Diagnosis not present

## 2021-08-08 DIAGNOSIS — J3081 Allergic rhinitis due to animal (cat) (dog) hair and dander: Secondary | ICD-10-CM | POA: Diagnosis not present

## 2021-08-08 DIAGNOSIS — J3089 Other allergic rhinitis: Secondary | ICD-10-CM | POA: Diagnosis not present

## 2021-08-08 DIAGNOSIS — J301 Allergic rhinitis due to pollen: Secondary | ICD-10-CM | POA: Diagnosis not present

## 2021-08-13 DIAGNOSIS — J3081 Allergic rhinitis due to animal (cat) (dog) hair and dander: Secondary | ICD-10-CM | POA: Diagnosis not present

## 2021-08-13 DIAGNOSIS — J301 Allergic rhinitis due to pollen: Secondary | ICD-10-CM | POA: Diagnosis not present

## 2021-08-13 DIAGNOSIS — J3089 Other allergic rhinitis: Secondary | ICD-10-CM | POA: Diagnosis not present

## 2021-08-18 ENCOUNTER — Other Ambulatory Visit: Payer: Self-pay | Admitting: Nurse Practitioner

## 2021-08-19 DIAGNOSIS — J3081 Allergic rhinitis due to animal (cat) (dog) hair and dander: Secondary | ICD-10-CM | POA: Diagnosis not present

## 2021-08-19 DIAGNOSIS — J3089 Other allergic rhinitis: Secondary | ICD-10-CM | POA: Diagnosis not present

## 2021-08-19 DIAGNOSIS — J301 Allergic rhinitis due to pollen: Secondary | ICD-10-CM | POA: Diagnosis not present

## 2021-08-25 ENCOUNTER — Other Ambulatory Visit: Payer: Self-pay | Admitting: Nurse Practitioner

## 2021-08-27 ENCOUNTER — Encounter: Payer: Self-pay | Admitting: Nurse Practitioner

## 2021-08-27 ENCOUNTER — Other Ambulatory Visit: Payer: Self-pay

## 2021-08-27 ENCOUNTER — Ambulatory Visit (INDEPENDENT_AMBULATORY_CARE_PROVIDER_SITE_OTHER): Payer: Medicare HMO | Admitting: Nurse Practitioner

## 2021-08-27 VITALS — BP 126/77 | HR 61 | Temp 97.4°F | Ht 61.0 in | Wt 173.4 lb

## 2021-08-27 DIAGNOSIS — E119 Type 2 diabetes mellitus without complications: Secondary | ICD-10-CM | POA: Diagnosis not present

## 2021-08-27 DIAGNOSIS — E785 Hyperlipidemia, unspecified: Secondary | ICD-10-CM

## 2021-08-27 DIAGNOSIS — E1169 Type 2 diabetes mellitus with other specified complication: Secondary | ICD-10-CM

## 2021-08-27 DIAGNOSIS — B009 Herpesviral infection, unspecified: Secondary | ICD-10-CM

## 2021-08-27 DIAGNOSIS — I10 Essential (primary) hypertension: Secondary | ICD-10-CM

## 2021-08-27 MED ORDER — ACYCLOVIR 400 MG PO TABS
400.0000 mg | ORAL_TABLET | Freq: Three times a day (TID) | ORAL | 3 refills | Status: DC
Start: 2021-08-27 — End: 2022-01-08

## 2021-08-27 MED ORDER — LIRAGLUTIDE 18 MG/3ML ~~LOC~~ SOPN: 0.6000 mg | PEN_INJECTOR | Freq: Every day | SUBCUTANEOUS | 3 refills | Status: DC

## 2021-08-27 NOTE — Progress Notes (Signed)
Subjective:    Patient ID: [name] [name], female    DOB: [birth year], 61 y.o.   MRN: [id number]  HPI  Patient here for 3 month follow up medications.  Patient does not have any concerns except for Metformin. Patient states that she cannot tolerate Metformin due to GI side effects and want to know what else can be prescribed for prediabetes/diabetes.   Patient also requesting that valtrex be switched to acyclovir. Patient states that she has not had a herpes outbreak since her 96s and want to know if it is still necessary to take the medication.   Patient has no other concerns. Denies heart palpitations, chest pain, leg swelling, new headaches.   Review of Systems  All other systems reviewed and are negative.     Objective:   Physical Exam Constitutional:      Appearance: Normal appearance. She is normal weight.  Cardiovascular:     Rate and Rhythm: Normal rate and regular rhythm.     Pulses: Normal pulses.     Heart sounds: Normal heart sounds. No murmur heard. Pulmonary:     Effort: Pulmonary effort is normal. No respiratory distress.     Breath sounds: Normal breath sounds. No wheezing.  Musculoskeletal:        General: Normal range of motion.  Skin:    General: Skin is warm.     Capillary Refill: Capillary refill takes less than 2 seconds.  Neurological:     General: No focal deficit present.     Mental Status: She is alert and oriented to person, place, and time.  Psychiatric:        Mood and Affect: Mood normal.        Behavior: Behavior normal.          Assessment & Plan:   1. Type 2 diabetes mellitus without complication, without long-term current use of insulin (HCC) - Last A1c = 6.5. Goal met with metformin use.  - Patient does not wish to continue taking Metformin due to side effects. - Patient to stop metform and start Victoza. - Patient to call insurance company to ensure they will cover Victoza.  - Patient educated in detail regarding GLP-1 to  include administration. Discussed signs/symptoms of hypoglycemia. Discussed sided effect of GI upset. Discussed risk of pancreatitis and thyroiditis. - liraglutide (VICTOZA) 18 MG/3ML SOPN; Inject 0.6 mg into the skin daily. Inject 0.6 mg into the skin daily for 1 week. The next week increase to 1.19m daily for 1 week and continue 1.290mdaily.  Dispense: 3 mL; Refill: 3 -Atenolol can potentially enhance hypoglycemic effect of Victoza.  Patient to increase Victoza to 1.2 and follow-up in 3 months. - HgB A1c - CMP14+EGFR -Return to clinic in 3 months for follow-up labs and evaluation. - Contact clinic if you have any concerns with Victoza.   2. Hyperlipidemia associated with type 2 diabetes mellitus ([place]-LDL at 94 which is an improvement from last LDL level of 108.  Goal is less than 70 given history of diabetes. - Lipid Panel With LDL/HDL Ratio -Continue with lifestyle modifications. -Continue taking atorvastatin 20 mg as prescribed.  3. Essential hypertension -Blood pressure today is 126/77.  Goal of 140/90 met. -Continue with lifestyle modifications. -Continue taking atenolol for palpitations.  4. Herpes simplex -Patient would like to trial stopping suppression dose acyclovir daily. -Acyclovir prescribed for potential outbreak.  Patient educated on how to take prescription if she develops an outbreak. -Acyclovir (ZOVIRAX) 400 MG tablet; Take 1  tablet (400 mg total) by mouth 3 (three) times daily. Take 1 tablet by mount 3 times a day for 7-10 if you develop an outbreak  Dispense: 30 tablet; Refill: 3

## 2021-08-28 NOTE — Addendum Note (Signed)
Addended by: Claire Shown on: 08/28/2021 02:15 PM   Modules accepted: Orders

## 2021-08-29 ENCOUNTER — Telehealth: Payer: Self-pay | Admitting: Nurse Practitioner

## 2021-08-29 DIAGNOSIS — J3081 Allergic rhinitis due to animal (cat) (dog) hair and dander: Secondary | ICD-10-CM | POA: Diagnosis not present

## 2021-08-29 DIAGNOSIS — J3089 Other allergic rhinitis: Secondary | ICD-10-CM | POA: Diagnosis not present

## 2021-08-29 DIAGNOSIS — J301 Allergic rhinitis due to pollen: Secondary | ICD-10-CM | POA: Diagnosis not present

## 2021-08-29 MED ORDER — "PEN NEEDLES 5/16"" 30G X 8 MM MISC": 1 refills | Status: DC

## 2021-08-29 NOTE — Telephone Encounter (Signed)
Prescription sent electronically to pharmacy. Patient notified. 

## 2021-08-29 NOTE — Telephone Encounter (Signed)
Patient is needing prescription for needles sent to [facility] they didn't come with her medication you prescribe at her last visit.

## 2021-09-02 DIAGNOSIS — J3089 Other allergic rhinitis: Secondary | ICD-10-CM | POA: Diagnosis not present

## 2021-09-02 DIAGNOSIS — J3081 Allergic rhinitis due to animal (cat) (dog) hair and dander: Secondary | ICD-10-CM | POA: Diagnosis not present

## 2021-09-02 DIAGNOSIS — J301 Allergic rhinitis due to pollen: Secondary | ICD-10-CM | POA: Diagnosis not present

## 2021-09-05 DIAGNOSIS — J301 Allergic rhinitis due to pollen: Secondary | ICD-10-CM | POA: Diagnosis not present

## 2021-09-05 DIAGNOSIS — J3089 Other allergic rhinitis: Secondary | ICD-10-CM | POA: Diagnosis not present

## 2021-09-05 DIAGNOSIS — J3081 Allergic rhinitis due to animal (cat) (dog) hair and dander: Secondary | ICD-10-CM | POA: Diagnosis not present

## 2021-09-08 ENCOUNTER — Ambulatory Visit: Payer: Medicare HMO | Admitting: Nurse Practitioner

## 2021-09-11 DIAGNOSIS — J3089 Other allergic rhinitis: Secondary | ICD-10-CM | POA: Diagnosis not present

## 2021-09-11 DIAGNOSIS — J3081 Allergic rhinitis due to animal (cat) (dog) hair and dander: Secondary | ICD-10-CM | POA: Diagnosis not present

## 2021-09-11 DIAGNOSIS — J301 Allergic rhinitis due to pollen: Secondary | ICD-10-CM | POA: Diagnosis not present

## 2021-09-19 ENCOUNTER — Other Ambulatory Visit: Payer: Self-pay | Admitting: Nurse Practitioner

## 2021-09-19 DIAGNOSIS — J3081 Allergic rhinitis due to animal (cat) (dog) hair and dander: Secondary | ICD-10-CM | POA: Diagnosis not present

## 2021-09-19 DIAGNOSIS — J301 Allergic rhinitis due to pollen: Secondary | ICD-10-CM | POA: Diagnosis not present

## 2021-09-19 DIAGNOSIS — J3089 Other allergic rhinitis: Secondary | ICD-10-CM | POA: Diagnosis not present

## 2021-09-26 DIAGNOSIS — J3089 Other allergic rhinitis: Secondary | ICD-10-CM | POA: Diagnosis not present

## 2021-09-26 DIAGNOSIS — J3081 Allergic rhinitis due to animal (cat) (dog) hair and dander: Secondary | ICD-10-CM | POA: Diagnosis not present

## 2021-09-26 DIAGNOSIS — J301 Allergic rhinitis due to pollen: Secondary | ICD-10-CM | POA: Diagnosis not present

## 2021-09-27 ENCOUNTER — Other Ambulatory Visit: Payer: Self-pay | Admitting: Nurse Practitioner

## 2021-09-27 ENCOUNTER — Other Ambulatory Visit: Payer: Self-pay | Admitting: Internal Medicine

## 2021-09-29 NOTE — Telephone Encounter (Signed)
Last ov 05/14/21 ?

## 2021-09-30 DIAGNOSIS — Z0100 Encounter for examination of eyes and vision without abnormal findings: Secondary | ICD-10-CM | POA: Diagnosis not present

## 2021-09-30 DIAGNOSIS — H5200 Hypermetropia, unspecified eye: Secondary | ICD-10-CM | POA: Diagnosis not present

## 2021-09-30 DIAGNOSIS — E119 Type 2 diabetes mellitus without complications: Secondary | ICD-10-CM | POA: Diagnosis not present

## 2021-09-30 DIAGNOSIS — H35363 Drusen (degenerative) of macula, bilateral: Secondary | ICD-10-CM | POA: Diagnosis not present

## 2021-09-30 DIAGNOSIS — E7800 Pure hypercholesterolemia, unspecified: Secondary | ICD-10-CM | POA: Diagnosis not present

## 2021-10-01 ENCOUNTER — Ambulatory Visit: Payer: Medicare HMO | Admitting: Internal Medicine

## 2021-10-02 DIAGNOSIS — J3089 Other allergic rhinitis: Secondary | ICD-10-CM | POA: Diagnosis not present

## 2021-10-02 DIAGNOSIS — J301 Allergic rhinitis due to pollen: Secondary | ICD-10-CM | POA: Diagnosis not present

## 2021-10-02 DIAGNOSIS — J3081 Allergic rhinitis due to animal (cat) (dog) hair and dander: Secondary | ICD-10-CM | POA: Diagnosis not present

## 2021-10-06 ENCOUNTER — Telehealth: Payer: Self-pay | Admitting: Adult Health

## 2021-10-06 NOTE — Telephone Encounter (Signed)
Rescheduled appointment per provider template. Patient is aware of the changes made to her upcoming appointment 

## 2021-10-09 DIAGNOSIS — J301 Allergic rhinitis due to pollen: Secondary | ICD-10-CM | POA: Diagnosis not present

## 2021-10-09 DIAGNOSIS — J3081 Allergic rhinitis due to animal (cat) (dog) hair and dander: Secondary | ICD-10-CM | POA: Diagnosis not present

## 2021-10-09 DIAGNOSIS — J3089 Other allergic rhinitis: Secondary | ICD-10-CM | POA: Diagnosis not present

## 2021-10-14 DIAGNOSIS — J3089 Other allergic rhinitis: Secondary | ICD-10-CM | POA: Diagnosis not present

## 2021-10-14 DIAGNOSIS — J3081 Allergic rhinitis due to animal (cat) (dog) hair and dander: Secondary | ICD-10-CM | POA: Diagnosis not present

## 2021-10-14 DIAGNOSIS — J301 Allergic rhinitis due to pollen: Secondary | ICD-10-CM | POA: Diagnosis not present

## 2021-10-21 ENCOUNTER — Other Ambulatory Visit: Payer: Self-pay | Admitting: Nurse Practitioner

## 2021-10-22 ENCOUNTER — Ambulatory Visit: Payer: Medicare HMO | Admitting: Internal Medicine

## 2021-10-22 VITALS — BP 110/62 | HR 73 | Temp 97.1°F | Ht 61.0 in | Wt 161.8 lb

## 2021-10-22 DIAGNOSIS — K5900 Constipation, unspecified: Secondary | ICD-10-CM

## 2021-10-22 DIAGNOSIS — Z800 Family history of malignant neoplasm of digestive organs: Secondary | ICD-10-CM

## 2021-10-22 DIAGNOSIS — K219 Gastro-esophageal reflux disease without esophagitis: Secondary | ICD-10-CM

## 2021-10-22 DIAGNOSIS — K760 Fatty (change of) liver, not elsewhere classified: Secondary | ICD-10-CM | POA: Diagnosis not present

## 2021-10-22 NOTE — Progress Notes (Signed)
? ? ?Referring Provider: Ameduite, Trenton Gammon, NP ?Primary Care Physician:  Ameduite, Trenton Gammon, NP ?Primary GI:  Dr. Abbey Chatters ? ?Chief Complaint  ?Patient presents with  ? Follow-up  ?  Bloating, states her same issues  ? ? ?HPI:   ?[name] [name] is a 61 y.o. female who presents to the clinic today for follow-up visit.  She has chronic reflux, previously on Dexilant 60 mg daily.  Was having breakthrough symptoms so I switched her back to pantoprazole 40 mg twice daily on previous visit.  States her reflux is much better complete trolled now.  No melena hematochezia.  No dysphagia odynophagia.  No epigastric or chest pain. ? ?EGD 01/14/2021 unremarkable besides mild H. pylori negative gastritis. ? ?She underwent right upper quadrant ultrasound 01/21/2021 which was negative besides fatty liver disease.  Risk factors for fatty liver include being overweight, diabetes, high cholesterol.  No previous or current alcohol use. ? ?Does note weight loss since previous visit since being diagnosed with fatty liver disease.  States she is dieting better. ? ?Does note chronic constipation as well.  Previously on Linzess 72 mcg which was too much.  Because numerous loose bowel movements.  Also previously on Amitiza 8 mg twice daily with similar results.  Now not currently taking anything.  States she has a bowel movement 1-2 times a week.  Also states she has straining often.  Previously taking MiraLAX which worsen her bloating. ? ?Also with bloating after meals.  States it does not matter what she eats she has bloating. ? ?Last colonoscopy 2019 unremarkable besides hemorrhoids.  Does note family history of colon cancer in her brother.  Also has a personal history of breast cancer.  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  Met with genetic counselor in regards to this.  Patient does not have a diagnosis of Lynch syndrome due to this VUS.  ? ?Patient and her  husband are active foster parents. ? ? ? ?Past Medical History:  ?Diagnosis Date  ? Allergic rhinitis   ? Anemia, unspecified 05/30/2013  ? Anxiety   ? Breast cancer ([place]) 03/31/13  ? left  ? Cancer [facility])   ? Chest pain 2009  ? Consultation-[facility], negative chest CT; nl echo in 2005; h/o palpitations  ? Chiari malformation type I ([place])   ? s/p suboccipital craniectomy, C1 laminectomy, superior C2 laminectomy, duraplasty 05/10/14  ? Colitis 2010  ? not IBD  ? Degenerative joint disease   ? + degenerative joint disease of the lumbosacral spine  ? Depression   ? Diabetes ([place])   ? Diabetes ([place])   ? Dysrhythmia   ? palpations  ? GERD (gastroesophageal reflux disease)   ? "a little"  ? Headache   ? Heart murmur   ? "small"  ? Herpes simplex type II infection   ? Hypercholesteremia   ? "slightly high"  ? Hypertension   ? does not have high blood pressure  ? 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 Palpitations   ? Personal history of chemotherapy   ? Personal history of radiation therapy   ? Wears glasses   ? ? ?Past Surgical History:  ?Procedure Laterality Date  ? ABDOMINAL HYSTERECTOMY  03/13/2007  ? TAH ?BSO--Dr Levin Bacon, [place]  ? BIOPSY  01/14/2021  ? Procedure: BIOPSY;  Surgeon: Eloise Harman, DO;  [facility];  Service: Endoscopy;;  ? BREAST EXCISIONAL BIOPSY  04/2003  ? Left; benign disease  ?  BREAST LUMPECTOMY Left 09/2013  ? BREAST LUMPECTOMY WITH NEEDLE LOCALIZATION AND AXILLARY LYMPH NODE DISSECTION Left 09/12/2013  ? Procedure: BREAST LUMPECTOMY WITH NEEDLE LOCALIZATION AND AXILLARY LYMPH NODE DISSECTION;  Surgeon: [name], MD;  Location: [place];  Service: General;  Laterality: Left;  and axilla  ? BREAST SURGERY    ? COLONOSCOPY  10/2010  ? proctitis; melanosis coli  ? COLONOSCOPY WITH PROPOFOL N/A 01/04/2018  ? Dr. [name]: hemorrhoids, follow up colonoscopy in 10 years  ? EAR CYST EXCISION Right 09/04/2019  ? Procedure: EXCISION EAR MASS;  Surgeon: [name], MD;  Location: [place];  Service: ENT;  Laterality: Right;  ? ESOPHAGOGASTRODUODENOSCOPY (EGD) WITH PROPOFOL N/A 01/14/2021  ? Procedure: ESOPHAGOGASTRODUODENOSCOPY (EGD) WITH PROPOFOL;  Surgeon: Eloise Harman, DO;  [facility];  Service: Endoscopy;  Laterality: N/A;  9:00am  ? EXCISION/RELEASE BURSA HIP Left 03/17/2019  ? Procedure: Left hip open bursectomy;  Surgeon: Nicholes Stairs, MD;  Location: [place];  Service: Orthopedics;  Laterality: Left;  75 mins  ? EYE SURGERY Left   ? "fix lazy eye"  ? PORTACATH PLACEMENT Right 04/21/2013  ? Procedure: INSERTION PORT-A-CATH;  Surgeon: [name], MD;  [facility];  Service: General;  Laterality: Right;  ? RIGHT OOPHORECTOMY  03/13/2007  ? SUBOCCIPITAL CRANIECTOMY CERVICAL LAMINECTOMY N/A 05/10/2014  ? Procedure: SUBOCCIPITAL CRANIECTOMY CERVICAL LAMINECTOMY/DURAPLASTY;  Surgeon: [name], MD;  Location: [place] [facility];  Service: Neurosurgery;  Laterality: N/A;  suboccipital craniectomy with cervical laminectomy and duraplasty  ? TUBAL LIGATION    ? ? ?Current Outpatient Medications  ?Medication Sig Dispense Refill  ? atenolol (TENORMIN) 50 MG tablet TAKE 1 TABLET BY MOUTH TWICE A DAY 60 tablet 1  ? atorvastatin (LIPITOR) 20 MG tablet TAKE 1 TABLET BY MOUTH DAILY AT 6 PM. 90 tablet 0  ? blood glucose meter kit and supplies KIT Dispense based on patient and insurance preference. Use up to four times daily as directed. 1 each 11  ? blood glucose meter kit and supplies Dispense based on patient and insurance preference. Use up to test glucose once daily. For ICD -10 E11.9. 1 each 0  ? diphenhydramine-acetaminophen (TYLENOL PM) 25-500 MG TABS tablet Take 1 tablet by mouth at bedtime as needed (sleep).    ? EPINEPHrine 0.3 mg/0.3 mL IJ SOAJ injection Inject 0.3 mg into the muscle as needed for anaphylaxis.   1  ? escitalopram (LEXAPRO) 20 MG tablet Take 1 tablet (20 mg total) by mouth daily. 90 tablet 1  ? famotidine (PEPCID) 40 MG tablet TAKE 1  TABLET BY MOUTH EVERYDAY AT BEDTIME 90 tablet 1  ? glucose blood (ONETOUCH ULTRA) test strip USE TO CHECK BLOOD SUGAR THREE TIMES A DAY 100 strip 0  ? Insulin Pen Needle (PEN NEEDLES 5/16") 30G X 8 MM MISC Use with victoza 100 each 1  ? Lancets (ONETOUCH DELICA PLUS [id number]) MISC USE TO TEST BLOOD GLUCOSE DAILY 100 each 5  ? levocetirizine (XYZAL) 5 MG tablet Take 5 mg by mouth every evening.    ? liraglutide (VICTOZA) 18 MG/3ML SOPN Inject 0.6 mg into the skin daily. Inject 0.6 mg into the skin daily for 1 week. The next week increase to 1.2m daily for 1 week and continue with 1.295mdaily. 3 mL 3  ? LORazepam (ATIVAN) 0.5 MG tablet Take 1 tablet (0.5 mg total) by mouth at bedtime as needed for sleep. 30 tablet 2  ? Melatonin  10 MG TABS Take 10 mg by mouth at bedtime.    ? methocarbamol (ROBAXIN) 500 MG tablet Take 500 mg by mouth daily as needed for muscle spasms.    ? MODERNA COVID-19 BIVAL BOOSTER 50 MCG/0.5ML injection     ? pantoprazole (PROTONIX) 40 MG tablet Take 1 tablet (40 mg total) by mouth 2 (two) times daily before a meal. 60 tablet 11  ? acyclovir (ZOVIRAX) 400 MG tablet Take 1 tablet (400 mg total) by mouth 3 (three) times daily. Take 1 tablet by mount 3 times a day for 7-10 if you develop an outbreak (Patient not taking: Reported on 10/22/2021) 30 tablet 3  ? FLUCELVAX QUADRIVALENT 0.5 ML injection  (Patient not taking: Reported on 10/22/2021)    ? ?No current facility-administered medications for this visit.  ? ? ?Allergies as of 10/22/2021 - Review Complete 10/22/2021  ?Allergen Reaction Noted  ? Doxycycline Itching and Swelling 06/27/2012  ? ? ?Family History  ?Problem Relation Age of Onset  ? Aneurysm Mother   ?     Cerebral  ? Hypertension Mother   ? Hyperlipidemia Mother   ? Stroke Mother   ? Coronary artery disease Father   ? Hypertension Father   ? Hyperlipidemia Father   ? Heart attack Father   ?     d. 22  ? Lung cancer Father   ?     dx early 102s; former smoker  ? Lung cancer Brother   ?  Lung cancer Brother   ?     d. 6y; smoker  ? Lung cancer Sister   ?     paternal half-sister; not a smoker; dx <60; d. 60y  ? Leukemia Maternal Aunt   ?     "blood cancer"; d. unspecified age  ? Cervical cancer

## 2021-10-22 NOTE — Patient Instructions (Signed)
I am happy to hear that your reflux is better now.  Continue on pantoprazole twice daily. ? ?For your constipation I want you to start taking over-the-counter Dulcolax (bisacodyl) once daily.  If this is too much then you can take it once every 2 to 3 days. ? ?I will give you information on a low FODMAP diet which may give you an idea of certain foods to avoid. ? ?We will plan on colonoscopy next year given your family history of colon cancer in your brother. ? ?Otherwise follow-up in 4 months or sooner if needed. ? ?It was very nice seeing you again today. ? ?Dr. Abbey Chatters ? ?At [facility] we value your feedback. You may receive a survey about your visit today. Please share your experience as we strive to create trusting relationships with our patients to provide genuine, compassionate, quality care. ? ?We appreciate your understanding and patience as we review any laboratory studies, imaging, and other diagnostic tests that are ordered as we care for you. Our office policy is 5 business days for review of these results, and any emergent or urgent results are addressed in a timely manner for your best interest. If you do not hear from our office in 1 week, please contact us.  ? ?We also encourage the use of MyChart, which contains your medical information for your review as well. If you are not enrolled in this feature, an access code is on this after visit summary for your convenience. Thank you for allowing [place] to be involved in your care. ? ?It was great to see you today!  I hope you have a great rest of your Spring! ? ? ? ?[name] [name]. Abbey Chatters, D.O. ?Gastroenterology and Hepatology ?[facility] ? ?

## 2021-10-24 DIAGNOSIS — J3089 Other allergic rhinitis: Secondary | ICD-10-CM | POA: Diagnosis not present

## 2021-10-24 DIAGNOSIS — J3081 Allergic rhinitis due to animal (cat) (dog) hair and dander: Secondary | ICD-10-CM | POA: Diagnosis not present

## 2021-10-24 DIAGNOSIS — J301 Allergic rhinitis due to pollen: Secondary | ICD-10-CM | POA: Diagnosis not present

## 2021-10-30 ENCOUNTER — Other Ambulatory Visit: Payer: Self-pay | Admitting: Family Medicine

## 2021-10-30 DIAGNOSIS — F5104 Psychophysiologic insomnia: Secondary | ICD-10-CM

## 2021-11-03 ENCOUNTER — Telehealth: Payer: Self-pay | Admitting: Nurse Practitioner

## 2021-11-03 NOTE — Telephone Encounter (Signed)
Patient  is requesting something for diarrhea to be called into [facility] ?

## 2021-11-03 NOTE — Telephone Encounter (Signed)
Patient states she has been having diarrhea since Friday, going 3-4 times per day, loose, watery stools.  She has also had vomiting and fever but the vomiting has gotten better. She is having nausea.  ?

## 2021-11-04 ENCOUNTER — Other Ambulatory Visit: Payer: Self-pay | Admitting: Nurse Practitioner

## 2021-11-04 ENCOUNTER — Encounter: Payer: Self-pay | Admitting: Nurse Practitioner

## 2021-11-04 ENCOUNTER — Ambulatory Visit (INDEPENDENT_AMBULATORY_CARE_PROVIDER_SITE_OTHER): Payer: Medicare HMO | Admitting: Nurse Practitioner

## 2021-11-04 ENCOUNTER — Other Ambulatory Visit (HOSPITAL_COMMUNITY)
Admission: RE | Admit: 2021-11-04 | Discharge: 2021-11-04 | Disposition: A | Discharge status: Home / Self Care | Payer: Medicare HMO | Source: Ambulatory Visit | Attending: Nurse Practitioner | Admitting: Nurse Practitioner

## 2021-11-04 VITALS — BP 115/70 | HR 111 | Temp 98.3°F | Wt 154.4 lb

## 2021-11-04 DIAGNOSIS — R112 Nausea with vomiting, unspecified: Secondary | ICD-10-CM | POA: Diagnosis not present

## 2021-11-04 DIAGNOSIS — R1084 Generalized abdominal pain: Secondary | ICD-10-CM

## 2021-11-04 LAB — COMPREHENSIVE METABOLIC PANEL
ALT: 130 U/L — ABNORMAL HIGH (ref 0–44)
AST: 114 U/L — ABNORMAL HIGH (ref 15–41)
Albumin: 4.4 g/dL (ref 3.5–5.0)
Alkaline Phosphatase: 71 U/L (ref 38–126)
Anion gap: 11 (ref 5–15)
BUN: 11 mg/dL (ref 6–20)
CO2: 19 mmol/L — ABNORMAL LOW (ref 22–32)
Calcium: 9.4 mg/dL (ref 8.9–10.3)
Chloride: 109 mmol/L (ref 98–111)
Creatinine, Ser: 1.09 mg/dL — ABNORMAL HIGH (ref 0.44–1.00)
GFR, Estimated: 58 mL/min — ABNORMAL LOW (ref >60)
Glucose, Bld: 107 mg/dL — ABNORMAL HIGH (ref 70–99)
Potassium: 3.4 mmol/L — ABNORMAL LOW (ref 3.5–5.1)
Sodium: 139 mmol/L (ref 135–145)
Total Bilirubin: 0.7 mg/dL (ref 0.3–1.2)
Total Protein: 8.4 g/dL — ABNORMAL HIGH (ref 6.5–8.1)

## 2021-11-04 LAB — CBC WITH DIFFERENTIAL/PLATELET
Abs Immature Granulocytes: 0.03 10*3/uL (ref 0.00–0.07)
Basophils Absolute: 0 10*3/uL (ref 0.0–0.1)
Basophils Relative: 0 %
Eosinophils Absolute: 0.1 10*3/uL (ref 0.0–0.5)
Eosinophils Relative: 1 %
HCT: 42.6 % (ref 36.0–46.0)
Hemoglobin: 14.1 g/dL (ref 12.0–15.0)
Immature Granulocytes: 0 %
Lymphocytes Relative: 16 %
Lymphs Abs: 1.7 10*3/uL (ref 0.7–4.0)
MCH: 26.4 pg (ref 26.0–34.0)
MCHC: 33.1 g/dL (ref 30.0–36.0)
MCV: 79.8 fL — ABNORMAL LOW (ref 80.0–100.0)
Monocytes Absolute: 0.9 10*3/uL (ref 0.1–1.0)
Monocytes Relative: 8 %
Neutro Abs: 8 10*3/uL — ABNORMAL HIGH (ref 1.7–7.7)
Neutrophils Relative %: 75 %
Platelets: 225 10*3/uL (ref 150–400)
RBC: 5.34 MIL/uL — ABNORMAL HIGH (ref 3.87–5.11)
RDW: 13.8 % (ref 11.5–15.5)
WBC: 10.7 10*3/uL — ABNORMAL HIGH (ref 4.0–10.5)
nRBC: 0 % (ref 0.0–0.2)

## 2021-11-04 LAB — AMYLASE: Amylase: 48 U/L (ref 28–100)

## 2021-11-04 LAB — LIPASE, BLOOD: Lipase: 25 U/L (ref 11–51)

## 2021-11-04 MED ORDER — ONDANSETRON 4 MG PO TBDP: 4.0000 mg | ORAL_TABLET | Freq: Three times a day (TID) | ORAL | 0 refills | Status: DC | PRN

## 2021-11-04 NOTE — Progress Notes (Signed)
? ?Subjective:  ? ? Patient ID: [name] [name], female    DOB: [birth year], 61 y.o.   MRN: [id number] ? ?HPI ? ?61 year old female presents to the clinic today with nausea and multiple episodes of vomiting and diarrhea since Friday (x5 days). Unable to keep anything down. Can keep very little fluids down. Pt also has been running a fever ranging from 99 to 100.1. Patient taking tylenol to help with fever. Pt states she has the same thing about one month ago and states that episode only lasted 2 days. Patient describes diarrhea has watery. Patient denies bilious vomiting, blood in vomit, blooding in stool.  ? ? ?Review of Systems  ?Gastrointestinal:  Positive for abdominal pain, diarrhea, nausea and vomiting.  ?All other systems reviewed and are negative. ? ?   ?Objective:  ? Physical Exam ?Vitals reviewed.  ?Constitutional:   ?   General: She is not in acute distress. ?   Appearance: Normal appearance. She is normal weight. She is not ill-appearing, toxic-appearing or diaphoretic.  ?   Comments: Orthostatic BP ?Sitting: 108/82 ?Standing: 100/74  ?HENT:  ?   Head: Normocephalic and atraumatic.  ?Cardiovascular:  ?   Rate and Rhythm: Normal rate and regular rhythm.  ?   Pulses: Normal pulses.  ?   Heart sounds: Normal heart sounds. No murmur heard. ?Pulmonary:  ?   Effort: Pulmonary effort is normal. No respiratory distress.  ?   Breath sounds: Normal breath sounds. No wheezing.  ?Abdominal:  ?   General: Abdomen is flat. Bowel sounds are normal. There is no distension or abdominal bruit. There are no signs of injury.  ?   Palpations: Abdomen is soft. There is no shifting dullness, fluid wave, hepatomegaly, splenomegaly, mass or pulsatile mass.  ?   Tenderness: There is generalized abdominal tenderness and tenderness in the left upper quadrant and left lower quadrant. There is no right CVA tenderness, left CVA tenderness, guarding or rebound. Negative signs include Murphy's sign, Rovsing's sign, McBurney's sign and  psoas sign.  ?   Hernia: No hernia is present.  ?   Comments: Generalized abd pain but worse on the left side.   ?Musculoskeletal:  ?   Comments: Grossly intact  ?Skin: ?   General: Skin is warm.  ?   Capillary Refill: Capillary refill takes less than 2 seconds.  ?Neurological:  ?   Mental Status: She is alert.  ?   Comments: Grossly intact  ?Psychiatric:     ?   Mood and Affect: Mood normal.     ?   Behavior: Behavior normal.  ? ? ? ? ? ?   ?Assessment & Plan:  ? ?1. Nausea and vomiting, unspecified vomiting type ?- Etiology of symptoms unknown at this time. However, gastritis vs colitis considered. ?- Will evaluated labs.  ?- Lipase ?- Amylase ?- ondansetron (ZOFRAN-ODT) 4 MG disintegrating tablet; Take 1 tablet (4 mg total) by mouth every 8 (eight) hours as needed for nausea or vomiting.  Dispense: 20 tablet; Refill: 0 ?- COMPLETE METABOLIC PANEL WITH GFR ?- CBC with Differential ?- Drink plenty fluids (Gatorade, Pedialyte, gingerale).  ?-Will order CT of abd if improving.  ?- Go to ED if symptoms worsen or intensify or if not able to tolerate fluids.  ? ?2. Generalized abdominal pain ?- Colitis vs. Gastritis ?- Lipase ?- Amylase ?- ondansetron (ZOFRAN-ODT) 4 MG disintegrating tablet; Take 1 tablet (4 mg total) by mouth every 8 (eight) hours as needed for nausea  or vomiting.  Dispense: 20 tablet; Refill: 0 ?- COMPLETE METABOLIC PANEL WITH GFR ?- CBC with Differential ? ?  ?Note:  This document was prepared using Dragon voice recognition software and may include unintentional dictation errors. ?Note - This record has been created using [employer].  ?Chart creation errors have been sought, but may not always  ?have been located. Such creation errors do not reflect on  ?the standard of medical care. ? ?. ?

## 2021-11-04 NOTE — Telephone Encounter (Signed)
Pt is calling back about medication  ?

## 2021-11-04 NOTE — Telephone Encounter (Signed)
Nurse please closed error ?

## 2021-11-04 NOTE — Telephone Encounter (Signed)
Patient scheduled office visit today with Barbee Shropshire NP ?

## 2021-11-04 NOTE — Telephone Encounter (Signed)
[facility], Trenton Gammon, NP   ? ?Please have patient schedule an appointment with me if she is continuing to have symptoms.  ? ?Refill of 30 days of Xanax done today today.  ? ?Barbee Shropshire   ? ?

## 2021-11-05 ENCOUNTER — Other Ambulatory Visit: Payer: Self-pay | Admitting: Nurse Practitioner

## 2021-11-05 DIAGNOSIS — J301 Allergic rhinitis due to pollen: Secondary | ICD-10-CM | POA: Diagnosis not present

## 2021-11-05 DIAGNOSIS — R1084 Generalized abdominal pain: Secondary | ICD-10-CM

## 2021-11-05 DIAGNOSIS — J3081 Allergic rhinitis due to animal (cat) (dog) hair and dander: Secondary | ICD-10-CM | POA: Diagnosis not present

## 2021-11-05 DIAGNOSIS — J3089 Other allergic rhinitis: Secondary | ICD-10-CM | POA: Diagnosis not present

## 2021-11-07 ENCOUNTER — Other Ambulatory Visit: Payer: Self-pay | Admitting: Nurse Practitioner

## 2021-11-07 ENCOUNTER — Ambulatory Visit (HOSPITAL_COMMUNITY)
Admission: RE | Admit: 2021-11-07 | Discharge: 2021-11-07 | Disposition: A | Discharge status: Home / Self Care | Payer: Medicare HMO | Source: Ambulatory Visit | Attending: Nurse Practitioner | Admitting: Nurse Practitioner

## 2021-11-07 DIAGNOSIS — R1084 Generalized abdominal pain: Secondary | ICD-10-CM | POA: Insufficient documentation

## 2021-11-07 DIAGNOSIS — R748 Abnormal levels of other serum enzymes: Secondary | ICD-10-CM

## 2021-11-07 DIAGNOSIS — K760 Fatty (change of) liver, not elsewhere classified: Secondary | ICD-10-CM | POA: Diagnosis not present

## 2021-11-10 DIAGNOSIS — J3089 Other allergic rhinitis: Secondary | ICD-10-CM | POA: Diagnosis not present

## 2021-11-10 DIAGNOSIS — J301 Allergic rhinitis due to pollen: Secondary | ICD-10-CM | POA: Diagnosis not present

## 2021-11-10 DIAGNOSIS — J3081 Allergic rhinitis due to animal (cat) (dog) hair and dander: Secondary | ICD-10-CM | POA: Diagnosis not present

## 2021-11-24 ENCOUNTER — Encounter: Payer: Self-pay | Admitting: Nurse Practitioner

## 2021-11-24 ENCOUNTER — Ambulatory Visit (INDEPENDENT_AMBULATORY_CARE_PROVIDER_SITE_OTHER): Payer: Medicare HMO | Admitting: Nurse Practitioner

## 2021-11-24 ENCOUNTER — Other Ambulatory Visit: Payer: Self-pay | Admitting: Nurse Practitioner

## 2021-11-24 VITALS — BP 110/68 | HR 76 | Temp 97.5°F | Ht 61.0 in | Wt 157.0 lb

## 2021-11-24 DIAGNOSIS — I10 Essential (primary) hypertension: Secondary | ICD-10-CM

## 2021-11-24 DIAGNOSIS — E785 Hyperlipidemia, unspecified: Secondary | ICD-10-CM | POA: Diagnosis not present

## 2021-11-24 DIAGNOSIS — R002 Palpitations: Secondary | ICD-10-CM

## 2021-11-24 DIAGNOSIS — E1169 Type 2 diabetes mellitus with other specified complication: Secondary | ICD-10-CM

## 2021-11-24 DIAGNOSIS — E119 Type 2 diabetes mellitus without complications: Secondary | ICD-10-CM | POA: Diagnosis not present

## 2021-11-24 DIAGNOSIS — Z1159 Encounter for screening for other viral diseases: Secondary | ICD-10-CM | POA: Diagnosis not present

## 2021-11-24 DIAGNOSIS — R748 Abnormal levels of other serum enzymes: Secondary | ICD-10-CM | POA: Diagnosis not present

## 2021-11-24 MED ORDER — ATENOLOL 50 MG PO TABS: 50.0000 mg | ORAL_TABLET | Freq: Every day | ORAL | 3 refills | Status: DC

## 2021-11-24 MED ORDER — LIRAGLUTIDE 18 MG/3ML ~~LOC~~ SOPN: 0.6000 mg | PEN_INJECTOR | Freq: Every day | SUBCUTANEOUS | 3 refills | Status: DC

## 2021-11-24 NOTE — Addendum Note (Signed)
Addended by: Claire Shown on: 11/24/2021 05:17 PM   Modules accepted: Orders

## 2021-11-24 NOTE — Progress Notes (Signed)
Subjective:    Patient ID: [name] [name], female    DOB: [birth year], 61 y.o.   MRN: [id number]  HPI 61 year old female with history of diabetes, hypertension, hyperlipidemia, anxiety, gastric reflux, presents to clinic for diabetic check.    Lab work is still pending  Patient states that she checks her blood sugars randomly at home and they run around 129.  Patient states that since she is started on Victoza she has not had any issues with low blood sugars.  Patient states that she is doing very well on Victoza and prefers that over the metformin.  Patient states that she is interested in trying Ozempic since it is 1 shot a week versus 1 shot daily.  Patient states that her palpitations are not as frequent and she would like to know if she still needs to be on the atenolol 50 mg twice a day.  Patient willing to start weaning off of that medication if possible.  Review of Systems  All other systems reviewed and are negative.     Objective:   Physical Exam Vitals reviewed.  Constitutional:      General: She is not in acute distress.    Appearance: She is obese. She is not ill-appearing, toxic-appearing or diaphoretic.  HENT:     Head: Normocephalic and atraumatic.  Cardiovascular:     Rate and Rhythm: Normal rate and regular rhythm.     Pulses: Normal pulses.     Heart sounds: Normal heart sounds. No murmur heard. Pulmonary:     Effort: Pulmonary effort is normal. No respiratory distress.     Breath sounds: Normal breath sounds. No wheezing.  Abdominal:     General: Abdomen is flat. Bowel sounds are normal.     Palpations: Abdomen is soft.  Musculoskeletal:     Comments: Grossly intact  Skin:    General: Skin is warm.     Capillary Refill: Capillary refill takes less than 2 seconds.  Neurological:     Mental Status: She is alert.     Comments: Grossly intact  Psychiatric:        Mood and Affect: Mood normal.        Behavior: Behavior normal.        Assessment &  Plan:   1. Type 2 diabetes mellitus without complication, without long-term current use of insulin ([place]) -Patient interested in switching from Victoza to [place] -We will evaluate patient's recent A1c to determine if and how to switch patient to [place] patient that GLP-1's can be hard to authorize with insurance -Reviewed side effects of GLP-1's in detail - liraglutide (VICTOZA) 18 MG/3ML SOPN; Inject 0.6 mg into the skin daily. Inject 0.6 mg into the skin daily for 1 week. The next week increase to 1.'2mg'$  daily for 1 week and continue with 1.'2mg'$  daily.  Dispense: 3 mL; Refill: 3 - HgB A1c - Urine Microalbumin w/creat. Ratio -Return to clinic in 3 months  2. Essential hypertension -Blood pressure today is 110/68. -Patient wanting to decrease her atenolol as she does not feel that she needs it.  Discussed that we will wean her off slowly. -Patient to start taking atenolol 50 mg 1 tablet a day instead of taking 2 tablets a day. - atenolol (TENORMIN) 50 MG tablet; Take 1 tablet (50 mg total) by mouth daily.  Dispense: 90 tablet; Refill: 3  3. Hyperlipidemia associated with type 2 diabetes mellitus ([place]) - Lipid Panel With LDL/HDL Ratio -Patient currently taking atorvastatin 20 mg -  If LDL not at goal of less than 70 we will consider either increasing atorvastatin or switching to Crestor depending on how far we are from goal  4. Palpitations -Patient wanting to decrease her atenolol as she does not feel that she needs it.  Discussed that we will wean her off slowly. -Patient to start taking atenolol 50 mg 1 tablet a day instead of taking 2 tablets a day. - atenolol (TENORMIN) 50 MG tablet; Take 1 tablet (50 mg total) by mouth daily.  Dispense: 90 tablet; Refill: 3    Note:  This document was prepared using Dragon voice recognition software and may include unintentional dictation errors. Note - This record has been created using [employer].  Chart creation errors have been  sought, but may not always  have been located. Such creation errors do not reflect on  the standard of medical care.

## 2021-11-25 DIAGNOSIS — E1169 Type 2 diabetes mellitus with other specified complication: Secondary | ICD-10-CM | POA: Diagnosis not present

## 2021-11-25 DIAGNOSIS — E785 Hyperlipidemia, unspecified: Secondary | ICD-10-CM | POA: Diagnosis not present

## 2021-11-25 DIAGNOSIS — E119 Type 2 diabetes mellitus without complications: Secondary | ICD-10-CM | POA: Diagnosis not present

## 2021-11-25 DIAGNOSIS — Z1159 Encounter for screening for other viral diseases: Secondary | ICD-10-CM | POA: Diagnosis not present

## 2021-11-25 LAB — CMP14+EGFR
ALT: 22 IU/L (ref 0–32)
AST: 19 IU/L (ref 0–40)
Albumin/Globulin Ratio: 1.7 (ref 1.2–2.2)
Albumin: 4.2 g/dL (ref 3.8–4.9)
Alkaline Phosphatase: 70 IU/L (ref 44–121)
BUN/Creatinine Ratio: 10 — ABNORMAL LOW (ref 12–28)
BUN: 8 mg/dL (ref 8–27)
Bilirubin Total: 0.4 mg/dL (ref 0.0–1.2)
CO2: 25 mmol/L (ref 20–29)
Calcium: 9.3 mg/dL (ref 8.7–10.3)
Chloride: 106 mmol/L (ref 96–106)
Creatinine, Ser: 0.84 mg/dL (ref 0.57–1.00)
Globulin, Total: 2.5 g/dL (ref 1.5–4.5)
Glucose: 110 mg/dL — ABNORMAL HIGH (ref 70–99)
Potassium: 4.2 mmol/L (ref 3.5–5.2)
Sodium: 144 mmol/L (ref 134–144)
Total Protein: 6.7 g/dL (ref 6.0–8.5)
eGFR: 80 mL/min/{1.73_m2} (ref >59)

## 2021-11-26 LAB — HEMOGLOBIN A1C
Est. average glucose Bld gHb Est-mCnc: 123 mg/dL
Hgb A1c MFr Bld: 5.9 % — ABNORMAL HIGH (ref 4.8–5.6)

## 2021-11-26 LAB — MICROALBUMIN / CREATININE URINE RATIO
Creatinine, Urine: 28.9 mg/dL
Microalb/Creat Ratio: <10 mg/g creat (ref 0–29)
Microalbumin, Urine: <3 ug/mL

## 2021-11-26 LAB — LIPID PANEL WITH LDL/HDL RATIO
Cholesterol, Total: 188 mg/dL (ref 100–199)
HDL: 63 mg/dL (ref >39)
LDL Chol Calc (NIH): 103 mg/dL — ABNORMAL HIGH (ref 0–99)
LDL/HDL Ratio: 1.6 ratio (ref 0.0–3.2)
Triglycerides: 128 mg/dL (ref 0–149)
VLDL Cholesterol Cal: 22 mg/dL (ref 5–40)

## 2021-11-26 LAB — HEPATITIS C ANTIBODY: Hep C Virus Ab: NONREACTIVE

## 2021-11-28 ENCOUNTER — Other Ambulatory Visit: Payer: Self-pay | Admitting: Nurse Practitioner

## 2021-11-28 DIAGNOSIS — E1169 Type 2 diabetes mellitus with other specified complication: Secondary | ICD-10-CM

## 2021-11-28 DIAGNOSIS — J3081 Allergic rhinitis due to animal (cat) (dog) hair and dander: Secondary | ICD-10-CM | POA: Diagnosis not present

## 2021-11-28 DIAGNOSIS — J301 Allergic rhinitis due to pollen: Secondary | ICD-10-CM | POA: Diagnosis not present

## 2021-11-28 DIAGNOSIS — J3089 Other allergic rhinitis: Secondary | ICD-10-CM | POA: Diagnosis not present

## 2021-11-28 MED ORDER — ROSUVASTATIN CALCIUM 10 MG PO TABS: 10.0000 mg | ORAL_TABLET | Freq: Every day | ORAL | 3 refills | Status: DC

## 2021-12-03 DIAGNOSIS — J301 Allergic rhinitis due to pollen: Secondary | ICD-10-CM | POA: Diagnosis not present

## 2021-12-03 DIAGNOSIS — J3081 Allergic rhinitis due to animal (cat) (dog) hair and dander: Secondary | ICD-10-CM | POA: Diagnosis not present

## 2021-12-03 DIAGNOSIS — J3089 Other allergic rhinitis: Secondary | ICD-10-CM | POA: Diagnosis not present

## 2021-12-16 ENCOUNTER — Encounter: Payer: Self-pay | Admitting: Adult Health

## 2021-12-16 ENCOUNTER — Inpatient Hospital Stay: Payer: Medicare HMO | Attending: Adult Health | Admitting: Adult Health

## 2021-12-16 ENCOUNTER — Other Ambulatory Visit: Payer: Self-pay

## 2021-12-16 VITALS — BP 124/69 | HR 66 | Temp 97.7°F | Resp 18 | Ht 61.0 in | Wt 156.0 lb

## 2021-12-16 DIAGNOSIS — C50412 Malignant neoplasm of upper-outer quadrant of left female breast: Secondary | ICD-10-CM | POA: Insufficient documentation

## 2021-12-16 DIAGNOSIS — Z923 Personal history of irradiation: Secondary | ICD-10-CM | POA: Diagnosis not present

## 2021-12-16 DIAGNOSIS — J301 Allergic rhinitis due to pollen: Secondary | ICD-10-CM | POA: Diagnosis not present

## 2021-12-16 DIAGNOSIS — Z170 Estrogen receptor positive status [ER+]: Secondary | ICD-10-CM | POA: Insufficient documentation

## 2021-12-16 DIAGNOSIS — Z87891 Personal history of nicotine dependence: Secondary | ICD-10-CM | POA: Diagnosis not present

## 2021-12-16 DIAGNOSIS — Z79899 Other long term (current) drug therapy: Secondary | ICD-10-CM | POA: Insufficient documentation

## 2021-12-16 DIAGNOSIS — J3089 Other allergic rhinitis: Secondary | ICD-10-CM | POA: Diagnosis not present

## 2021-12-16 DIAGNOSIS — Z794 Long term (current) use of insulin: Secondary | ICD-10-CM | POA: Insufficient documentation

## 2021-12-16 DIAGNOSIS — Z90721 Acquired absence of ovaries, unilateral: Secondary | ICD-10-CM | POA: Insufficient documentation

## 2021-12-16 DIAGNOSIS — Z90710 Acquired absence of both cervix and uterus: Secondary | ICD-10-CM | POA: Diagnosis not present

## 2021-12-16 DIAGNOSIS — M8580 Other specified disorders of bone density and structure, unspecified site: Secondary | ICD-10-CM | POA: Diagnosis not present

## 2021-12-16 DIAGNOSIS — M545 Low back pain, unspecified: Secondary | ICD-10-CM | POA: Insufficient documentation

## 2021-12-16 DIAGNOSIS — J3081 Allergic rhinitis due to animal (cat) (dog) hair and dander: Secondary | ICD-10-CM | POA: Diagnosis not present

## 2021-12-16 DIAGNOSIS — Z9079 Acquired absence of other genital organ(s): Secondary | ICD-10-CM | POA: Insufficient documentation

## 2021-12-16 NOTE — Progress Notes (Unsigned)
[place]  [phone] [phone]    ID: [name] [name] OB: 06/11/1961  MR#: [id number]  QTM#:[id number]  Patient Care Team: Ameduite, Trenton Gammon, NP as PCP - General (Nurse Practitioner) Magrinat, Virgie Dad, MD (Inactive) as Consulting Physician (Oncology) Nicholes Stairs, MD as Consulting Physician (Orthopedic Surgery) Nilda Simmer, NP (Family Medicine) Eloise Harman, DO as Consulting Physician (Gastroenterology) OTHER MD:   CHIEF COMPLAINT:  Left Breast Cancer  CURRENT TREATMENT: observation  BREAST CANCER HISTORY: From the original intake note 04/12/2013:  [name] [name] palpated a mass in her left breast in late August 2014. She was already scheduled for screening mammography at [facility] and this was performed 03/13/2013. Indeed a possible mass was noted in the left breast and on 03/29/2013 the patient underwent diagnostic left mammography and left ultrasonography. This showed a 2.5 cm irregular mass in the upper outer quadrant of the left breast. This was palpable.by ultrasound there was a 1.6 cm irregular hypoechoic mass in the area in question, and in addition to enlarged left axillary lymph nodes were noted, the largest measuring 2.5 cm.  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  Bilateral breast MRI 04/11/2013 shows 3 abnormal enhancing masses in the upper outer quadrant of the left breast measuring altogether 6.4 cm. The largest separate mass measured 2.7 cm. There were abnormal or large lymph nodes in the left axilla. Ultrasound guided biopsy of the 2 smaller masses has been  scheduled.  The patient's subsequent history is as detailed below   INTERVAL HISTORY: [name] [name] returns today for follow-up and treatment of her estrogen receptor positive breast cancer.  She continues on observation alone.  She is seeing her PCP regularly.    Her most recent mammogram was completed on 05/15/2021 that shows no evidence of malignancy and breast density category B.  [name] [name] continues to see primary care regularly.  She is doing well and has had no significant health changes other than having an ultrasound this year that showed some fatty liver.  She does have some lower centralized back pain in her lower thoracic spine.  This is nonradiating and has been going on for a few months.  Otherwise she is doing well and is without any questions or concerns.   REVIEW OF SYSTEMS:  Review of Systems  Constitutional:  Negative for appetite change, chills, fatigue, fever and unexpected weight change.  HENT:   Negative for hearing loss, lump/mass and trouble swallowing.   Eyes:  Negative for eye problems and icterus.  Respiratory:  Negative for chest tightness, cough and shortness of breath.   Cardiovascular:  Negative for chest pain, leg swelling and palpitations.  Gastrointestinal:  Negative for abdominal distention, abdominal pain, constipation, diarrhea, nausea and vomiting.  Endocrine: Negative for hot flashes.  Genitourinary:  Negative for difficulty urinating.   Musculoskeletal:  Negative for arthralgias.  Skin:  Negative for itching and rash.  Neurological:  Negative for dizziness, extremity weakness, headaches and numbness.  Hematological:  Negative for adenopathy. Does not bruise/bleed easily.  Psychiatric/Behavioral:  Negative for depression. The patient is not nervous/anxious.       PAST MEDICAL  HISTORY: Past Medical History:  Diagnosis Date   Allergic rhinitis    Anemia, unspecified 05/30/2013   Anxiety    Breast cancer ([place]) 03/31/13   left   Cancer ([place])    Chest  pain 2009   Consultation-[facility], negative chest CT; nl echo in 2005; h/o palpitations   Chiari malformation type I ([place])    s/p suboccipital craniectomy, C1 laminectomy, superior C2 laminectomy, duraplasty 05/10/14   Colitis 2010   not IBD   Degenerative joint disease    + degenerative joint disease of the lumbosacral spine   Depression    Diabetes ([place])    Diabetes ([place])    Dysrhythmia    palpations   GERD (gastroesophageal reflux disease)    "a little"   Headache    Heart murmur    "small"   Herpes simplex type II infection    Hypercholesteremia    "slightly high"   Hypertension    does not have high blood pressure   Meningitis 08/22/2014   Palpitations    Personal history of chemotherapy    Personal history of radiation therapy    Wears glasses     PAST SURGICAL HISTORY: Past Surgical History:  Procedure Laterality Date   ABDOMINAL HYSTERECTOMY  03/13/2007   TAH ?BSO--Dr Levin Bacon, [place]   BIOPSY  01/14/2021   Procedure: BIOPSY;  Surgeon: Eloise Harman, DO;  [facility];  Service: Endoscopy;;   BREAST EXCISIONAL BIOPSY  04/2003   Left; benign disease   BREAST LUMPECTOMY Left 09/2013   BREAST LUMPECTOMY WITH NEEDLE LOCALIZATION AND AXILLARY LYMPH NODE DISSECTION Left 09/12/2013   Procedure: BREAST LUMPECTOMY WITH NEEDLE LOCALIZATION AND AXILLARY LYMPH NODE DISSECTION;  Surgeon: [name], MD;  Location: [facility];  Service: General;  Laterality: Left;  and axilla   BREAST SURGERY     COLONOSCOPY  10/2010   proctitis; melanosis coli   COLONOSCOPY WITH PROPOFOL N/A 01/04/2018   Dr. [name]: hemorrhoids, follow up colonoscopy in 10 years   EAR CYST EXCISION Right 09/04/2019   Procedure: EXCISION EAR MASS;  Surgeon: [name], MD;  Location: [facility];  Service: ENT;  Laterality: Right;   ESOPHAGOGASTRODUODENOSCOPY (EGD) WITH PROPOFOL N/A 01/14/2021   Procedure: ESOPHAGOGASTRODUODENOSCOPY (EGD) WITH PROPOFOL;  Surgeon:  Eloise Harman, DO;  [facility];  Service: Endoscopy;  Laterality: N/A;  9:00am   EXCISION/RELEASE BURSA HIP Left 03/17/2019   Procedure: Left hip open bursectomy;  Surgeon: Nicholes Stairs, MD;  Location: [facility];  Service: Orthopedics;  Laterality: Left;  75 mins   EYE SURGERY Left    "fix lazy eye"   PORTACATH PLACEMENT Right 04/21/2013   Procedure: INSERTION PORT-A-CATH;  Surgeon: [name], MD;  Location: [facility];  Service: General;  Laterality: Right;   RIGHT OOPHORECTOMY  03/13/2007   SUBOCCIPITAL CRANIECTOMY CERVICAL LAMINECTOMY N/A 05/10/2014   Procedure: SUBOCCIPITAL CRANIECTOMY CERVICAL LAMINECTOMY/DURAPLASTY;  Surgeon: [name], MD;  Location: [facility];  Service: Neurosurgery;  Laterality: N/A;  suboccipital craniectomy with cervical laminectomy and duraplasty   TUBAL LIGATION      FAMILY HISTORY Family History  Problem Relation Age of Onset   Aneurysm Mother        Cerebral   Hypertension Mother    Hyperlipidemia Mother    Stroke Mother    Coronary artery disease Father    Hypertension Father    Hyperlipidemia Father    Heart attack Father  d. 52   Lung cancer Father        dx early 42s; former smoker   Lung cancer Brother    Lung cancer Brother        d. 64y; smoker   Lung cancer Sister        paternal half-sister; not a smoker; dx <60; d. 60y   Leukemia Maternal Aunt        "blood cancer"; d. unspecified age   Cervical cancer Paternal Aunt        d. late 60s   Breast cancer Sister 24   Other Sister        hx of hysterectomy for unspecified reason   Multiple myeloma Brother        d. 107s   Lung cancer Brother        dx late 73s; smoker   Prostate cancer Brother        dx late 82s   Prostate cancer Brother        dx late 36s   Lung cancer Brother    Lung cancer Maternal Aunt        d. unspecified age   Breast cancer Maternal Aunt        dx unspecified age   Prostate cancer Cousin        maternal 1st cousin; dx  older age   Breast cancer Other        niece dx early 40s or before   Leukemia Other        nephew d. 2y; "blood cancer"   Colon cancer Neg Hx    Diabetes Neg Hx   The patient's father died at the age of 40 from a myocardial infarction. The patient's mother died at the age of 35 from a stroke. The patient had 6 brothers, 2 sisters. One brother has a history of lung cancer in the setting of tobacco abuse. Another brother had multiple myeloma. One sister was diagnosed with breast cancer at the age of 48   GYNECOLOGIC HISTORY: (Updated 09/25/2013) Menarche age 23, first live birth age 44, the patient is [name] [name]. She status post abdominal hysterectomy with bilateral salpingo-oophorectomy. She never took hormone replacement.   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  Her husband [name] works in Charity fundraiser. Daughter [name] lives at home with the patient with 1 of her 3 children. Son Wynonna Fitzhenry lives in [place] and is not employed. Daughter Maggie Schwalbe Dillard lives in [place] and is a Barrister's clerk. Daughter Ernst Spell is a Education officer, museum in [place].  The patient has 10 grandchildren. She attends [employer]     ADVANCED DIRECTIVES: Not in place   HEALTH MAINTENANCE:  (Updated 09/25/2013)  Social History   Tobacco Use   Smoking status: Former    Packs/day: 0.50    Years: 15.00    Total pack years: 7.50    Types: Cigarettes    Quit date: 07/06/1982    Years since quitting: 39.4   Smokeless tobacco: Never  Vaping Use   Vaping Use: Never used  Substance Use Topics   Alcohol use: No   Drug use: No     Colonoscopy:2012  PAP:2014   Bone density: Never  Lipid panel: Not on file    Allergies  Allergen Reactions   Doxycycline Itching and Swelling    Current Outpatient Medications  Medication  Sig Dispense Refill   acyclovir (ZOVIRAX) 400 MG  tablet Take 1 tablet (400 mg total) by mouth 3 (three) times daily. Take 1 tablet by mount 3 times a day for 7-10 if you develop an outbreak 30 tablet 3   atenolol (TENORMIN) 50 MG tablet Take 1 tablet (50 mg total) by mouth daily. 90 tablet 3   blood glucose meter kit and supplies KIT Dispense based on patient and insurance preference. Use up to four times daily as directed. 1 each 11   blood glucose meter kit and supplies Dispense based on patient and insurance preference. Use up to test glucose once daily. For ICD -10 E11.9. 1 each 0   diphenhydramine-acetaminophen (TYLENOL PM) 25-500 MG TABS tablet Take 1 tablet by mouth at bedtime as needed (sleep).     EPINEPHrine 0.3 mg/0.3 mL IJ SOAJ injection Inject 0.3 mg into the muscle as needed for anaphylaxis.   1   escitalopram (LEXAPRO) 20 MG tablet Take 1 tablet (20 mg total) by mouth daily. 90 tablet 1   famotidine (PEPCID) 40 MG tablet TAKE 1 TABLET BY MOUTH EVERYDAY AT BEDTIME 90 tablet 1   glucose blood (ONETOUCH ULTRA) test strip USE TO CHECK BLOOD SUGAR THREE TIMES A DAY 100 strip 0   Insulin Pen Needle (PEN NEEDLES 5/16") 30G X 8 MM MISC Use with victoza 100 each 1   Lancets (ONETOUCH DELICA PLUS [id number]) MISC USE TO TEST BLOOD GLUCOSE DAILY 100 each 5   levocetirizine (XYZAL) 5 MG tablet Take 5 mg by mouth every evening.     liraglutide (VICTOZA) 18 MG/3ML SOPN Inject 0.6 mg into the skin daily. Inject 0.6 mg into the skin daily for 1 week. The next week increase to 1.62m daily for 1 week and continue with 1.229mdaily. 3 mL 3   LORazepam (ATIVAN) 0.5 MG tablet Take 1 tablet (0.5 mg total) by mouth at bedtime as needed for sleep. 30 tablet 0   Melatonin 10 MG TABS Take 10 mg by mouth at bedtime.     methocarbamol (ROBAXIN) 500 MG tablet Take 500 mg by mouth daily as needed for muscle spasms.     ondansetron (ZOFRAN-ODT) 4 MG disintegrating tablet Take 1 tablet (4 mg total) by mouth every 8 (eight) hours as needed for nausea or vomiting. 20  tablet 0   pantoprazole (PROTONIX) 40 MG tablet Take 1 tablet (40 mg total) by mouth 2 (two) times daily before a meal. 60 tablet 11   rosuvastatin (CRESTOR) 10 MG tablet Take 1 tablet (10 mg total) by mouth daily. 90 tablet 3   No current facility-administered medications for this visit.    OBJECTIVE: Vitals:   12/16/21 0952  BP: 124/69  Pulse: 66  Resp: 18  Temp: 97.7 F (36.5 C)  SpO2: 98%  Body mass index is 29.48 kg/m.  ECOG: 1 Filed Weights   12/16/21 0952  Weight: 156 lb (70.8 kg)   GENERAL: Patient is a well appearing female in no acute distress HEENT:  Sclerae anicteric.  Oropharynx clear and moist. No ulcerations or evidence of oropharyngeal candidiasis. Neck is supple.  NODES:  No cervical, supraclavicular, or axillary lymphadenopathy palpated.  BREAST EXAM:  right breast benign, left breast s/p lumpectomy and radiation, no sign of local recurrence noted. LUNGS:  Clear to auscultation bilaterally.  No wheezes or rhonchi. HEART:  Regular rate and rhythm. No murmur appreciated. ABDOMEN:  Soft, nontender.  Positive, normoactive bowel sounds. No organomegaly palpated. MSK:  No focal spinal  tenderness to palpation. Full range of motion bilaterally in the upper extremities. EXTREMITIES:  No peripheral edema.   SKIN:  Clear with no obvious rashes or skin changes. No nail dyscrasia. NEURO:  Nonfocal. Well oriented.  Appropriate affect.    LAB RESULTS:   Lab Results  Component Value Date   WBC 10.7 (H) 11/04/2021   NEUTROABS 8.0 (H) 11/04/2021   HGB 14.1 11/04/2021   HCT 42.6 11/04/2021   MCV 79.8 (L) 11/04/2021   PLT 225 11/04/2021      Chemistry      Component Value Date/Time   NA 144 11/24/2021 0926   NA 147 (H) 05/19/2017 1335   K 4.2 11/24/2021 0926   K 4.0 05/19/2017 1335   CL 106 11/24/2021 0926   CO2 25 11/24/2021 0926   CO2 28 05/19/2017 1335   BUN 8 11/24/2021 0926   BUN 8.2 05/19/2017 1335   CREATININE 0.84 11/24/2021 0926   CREATININE 1.03  06/21/2018 1403   CREATININE 0.9 05/19/2017 1335      Component Value Date/Time   CALCIUM 9.3 11/24/2021 0926   CALCIUM 9.1 05/19/2017 1335   ALKPHOS 70 11/24/2021 0926   ALKPHOS 41 05/19/2017 1335   AST 19 11/24/2021 0926   AST 16 05/19/2017 1335   ALT 22 11/24/2021 0926   ALT 14 05/19/2017 1335   BILITOT 0.4 11/24/2021 0926   BILITOT 0.36 05/19/2017 1335         STUDIES: No results found.   ASSESSMENT/PLAN: 61 y.o. [place], [place] woman   (1)  status post left breast upper outer quadrant biopsy 03/29/2013 for a clinical T2-T3 pN1, stage IIB-IIIA invasive ductal carcinoma, grade 2-3, estrogen receptor 39% positive (with moderate staining intensity), progesterone receptor negative, with an MIB-1 of 83%, and HER-2 amplified by CISH, with a ratio of 7.5 and 15 HER-2 copies per cell  (2) neoadjuvant chemotherapy, with trastuzumab/ pertuzumab/ carboplatin/ docetaxel repeated every 3 weeks x6, completed 08/21/2013. Pertuzumab  held after 4 cycles secondary to diarrhea. The patient received Neulasta on day 2 at [facility] per her request.   (3) Trastuzumab continued for total of one year (last dose 04/13/2014)). Most recent echo 06/04/2014 showed a well preserved ejection fraction.  (4)  status post left lumpectomy and axillary node dissection 09/12/2013, with a complete pathologic response (a total of 16 lymph nodes were examined,1 sentinel and 15 non-sentinel),  YpT0, ypN0.  (5) adjuvant radiation at [facility] completed 12/08/2013  (6) started anastrozole 01/16/2014  (a) bone density 04/09/2016 shows osteopenia with a T score of -1.7   (b) switched to tamoxifen as of OCT 2017 because of vaginal dryness issues   (i) status post remote total abdominal hysterectomy with right salpingo-oophorectomy  (c) pelvic and transvaginal ultrasound found a normal left ovary, vaginal cuff appeared normal, with no area of suspicious attachment of the bowel to the cuff apex  (d) tamoxifen  discontinued November 2019  (7)  genetics testing 06/22/2016 through the 21-gene Custom Cancer Panel through [employer], MD) found no deleterious mutations in ATM, BARD1, BRCA1, BRCA2, BRIP1, CDH1, CHEK2, EPCAM, FANCC, HOXB13, MLH1, MSH2, MSH6, NBN, PALB2, PMS2, PTEN, RAD51C, RAD51D, TP53, and XRCC2.    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    PLAN: [name] [name] is here today for follow-up of her h/o breast cancer.  She has no clinical or radiographic sign of breast cancer recurrence.  We reviewed her recent mammogram  results that demonstrate no evidence of malignancy.  She is recommended to repeat mammography in 05/2022.   We discussed her option of transitioning to her PCP for her breast cancer surveillance.  Our recommendation in proceeding this route is that she continue to undergo clinical breast exam and mammography annually.  I also recommended healthy diet and exercise.    [name] [name] verbalized understanding of the above and will proceed with this approach.  Concerning her lower back pain, I ordered xrays for her to evaluate this further.    I reviewed with [name] [name] that we are always happy to see her at any point in the future should the need arise.    Total encounter time:20 minutes*in face-to-face visit time, chart review, lab review, care coordination, order entry, and documentation of the encounter time.    Wilber Bihari, NP 12/17/21 9:02 PM Medical Oncology and Hematology [facility] [place], [place] [address] Tel. [phone]    Fax. [phone]  *Total Encounter Time as defined by the Centers for Medicare and Medicaid Services includes, in addition to the face-to-face time of a patient visit (documented in the note above) non-face-to-face time: obtaining and reviewing outside history, ordering and reviewing medications, tests or procedures, care  coordination (communications with other health care professionals or caregivers) and documentation in the medical record.

## 2021-12-23 ENCOUNTER — Ambulatory Visit (INDEPENDENT_AMBULATORY_CARE_PROVIDER_SITE_OTHER): Payer: Medicare HMO | Admitting: Nurse Practitioner

## 2021-12-23 ENCOUNTER — Encounter: Payer: Self-pay | Admitting: Nurse Practitioner

## 2021-12-23 VITALS — BP 112/72 | Ht 60.5 in | Wt 155.6 lb

## 2021-12-23 DIAGNOSIS — E119 Type 2 diabetes mellitus without complications: Secondary | ICD-10-CM

## 2021-12-23 DIAGNOSIS — L989 Disorder of the skin and subcutaneous tissue, unspecified: Secondary | ICD-10-CM | POA: Diagnosis not present

## 2021-12-23 MED ORDER — OZEMPIC (0.25 OR 0.5 MG/DOSE) 2 MG/3ML ~~LOC~~ SOPN: 0.2500 mg | PEN_INJECTOR | SUBCUTANEOUS | 0 refills | Status: AC

## 2021-12-23 MED ORDER — OZEMPIC (0.25 OR 0.5 MG/DOSE) 2 MG/3ML ~~LOC~~ SOPN: 0.5000 mg | PEN_INJECTOR | SUBCUTANEOUS | 0 refills | Status: DC

## 2021-12-23 NOTE — Progress Notes (Signed)
   Subjective:    Patient ID: [name] [name], female    DOB: August 15, 1960, 61 y.o.   MRN: [id number]  HPI Patient arrives to have form completed for foster care system. Currently has 2 foster children 60 an 48 years old. Patient also has a mole on her neck she would like checked.  Patient also is still interested in [place] as she would like to decrease the amount of times she needs to inject herself with medication.  Patient currently on Victoza 1.25 mg daily and patient would like to transition to Ozempic for a weekly injection.   Review of Systems  All other systems reviewed and are negative.      Objective:   Physical Exam Vitals reviewed.  Constitutional:      General: She is not in acute distress.    Appearance: Normal appearance. She is normal weight. She is not ill-appearing, toxic-appearing or diaphoretic.  HENT:     Head: Normocephalic and atraumatic.  Cardiovascular:     Rate and Rhythm: Normal rate and regular rhythm.     Pulses: Normal pulses.     Heart sounds: Normal heart sounds. No murmur heard. Pulmonary:     Effort: Pulmonary effort is normal. No respiratory distress.     Breath sounds: Normal breath sounds. No wheezing.  Musculoskeletal:     Comments: Grossly intact  Skin:    General: Skin is warm.     Capillary Refill: Capillary refill takes less than 2 seconds.     Comments: Small (approximately 0.2 cm) black raised lesion well-circumscribed slightly asymmetric to right shoulder  Neurological:     Mental Status: She is alert.     Comments: Grossly intact  Psychiatric:        Mood and Affect: Mood normal.        Behavior: Behavior normal.       Assessment & Plan:  1. Skin lesion -Likely a benign nevus however will have patient evaluated at dermatology - Ambulatory referral to Dermatology  2. Type 2 diabetes mellitus without complication, without long-term current use of insulin (HCC) -Patient currently on Victoza 1.2 mg. -A1c 5.9 down from  6.5. -Patient would like to trial Ozempic instead of Victoza to decrease the amount of injections -We will attempt to do prior authorization to determine if insurance will cover Ozempic - Semaglutide,0.25 or 0.'5MG'$ /DOS, (OZEMPIC, 0.25 OR 0.5 MG/DOSE,) 2 MG/3ML SOPN; Inject 0.25 mg into the skin once a week for 4 days.  Dispense: 3 mL; Refill: 0 - Semaglutide,0.25 or 0.'5MG'$ /DOS, (OZEMPIC, 0.25 OR 0.5 MG/DOSE,) 2 MG/3ML SOPN; Inject 0.5 mg into the skin once a week for 4 doses.  Dispense: 3 mL; Refill: 0 -Return to clinic in 3 months for repeat A1c or sooner if you are started on Ozempic.  Patient to return in 4 weeks after starting Ozempic if Ozempic is started    Note:  This document was prepared using Dragon voice recognition software and may include unintentional dictation errors. Note - This record has been created using [employer].  Chart creation errors have been sought, but may not always  have been located. Such creation errors do not reflect on  the standard of medical care.

## 2021-12-26 ENCOUNTER — Ambulatory Visit (HOSPITAL_COMMUNITY)
Admission: RE | Admit: 2021-12-26 | Discharge: 2021-12-26 | Disposition: A | Discharge status: Home / Self Care | Payer: Medicare HMO | Source: Ambulatory Visit | Attending: Adult Health | Admitting: Adult Health

## 2021-12-26 DIAGNOSIS — Z170 Estrogen receptor positive status [ER+]: Secondary | ICD-10-CM | POA: Diagnosis not present

## 2021-12-26 DIAGNOSIS — M546 Pain in thoracic spine: Secondary | ICD-10-CM | POA: Diagnosis not present

## 2021-12-26 DIAGNOSIS — C50412 Malignant neoplasm of upper-outer quadrant of left female breast: Secondary | ICD-10-CM | POA: Insufficient documentation

## 2021-12-26 DIAGNOSIS — J3081 Allergic rhinitis due to animal (cat) (dog) hair and dander: Secondary | ICD-10-CM | POA: Diagnosis not present

## 2021-12-26 DIAGNOSIS — J301 Allergic rhinitis due to pollen: Secondary | ICD-10-CM | POA: Diagnosis not present

## 2021-12-26 DIAGNOSIS — J3089 Other allergic rhinitis: Secondary | ICD-10-CM | POA: Diagnosis not present

## 2021-12-26 DIAGNOSIS — M545 Low back pain, unspecified: Secondary | ICD-10-CM | POA: Diagnosis not present

## 2021-12-29 ENCOUNTER — Telehealth: Payer: Self-pay

## 2022-01-01 DIAGNOSIS — J3081 Allergic rhinitis due to animal (cat) (dog) hair and dander: Secondary | ICD-10-CM | POA: Diagnosis not present

## 2022-01-01 DIAGNOSIS — J301 Allergic rhinitis due to pollen: Secondary | ICD-10-CM | POA: Diagnosis not present

## 2022-01-01 DIAGNOSIS — J3089 Other allergic rhinitis: Secondary | ICD-10-CM | POA: Diagnosis not present

## 2022-01-05 ENCOUNTER — Other Ambulatory Visit: Payer: Self-pay | Admitting: Nurse Practitioner

## 2022-01-05 DIAGNOSIS — F32A Depression, unspecified: Secondary | ICD-10-CM

## 2022-01-05 DIAGNOSIS — B009 Herpesviral infection, unspecified: Secondary | ICD-10-CM

## 2022-01-07 DIAGNOSIS — J301 Allergic rhinitis due to pollen: Secondary | ICD-10-CM | POA: Diagnosis not present

## 2022-01-07 DIAGNOSIS — J3081 Allergic rhinitis due to animal (cat) (dog) hair and dander: Secondary | ICD-10-CM | POA: Diagnosis not present

## 2022-01-07 DIAGNOSIS — J3089 Other allergic rhinitis: Secondary | ICD-10-CM | POA: Diagnosis not present

## 2022-01-12 DIAGNOSIS — J3089 Other allergic rhinitis: Secondary | ICD-10-CM | POA: Diagnosis not present

## 2022-01-12 DIAGNOSIS — J3081 Allergic rhinitis due to animal (cat) (dog) hair and dander: Secondary | ICD-10-CM | POA: Diagnosis not present

## 2022-01-12 DIAGNOSIS — J301 Allergic rhinitis due to pollen: Secondary | ICD-10-CM | POA: Diagnosis not present

## 2022-01-19 ENCOUNTER — Encounter: Payer: Self-pay | Admitting: Internal Medicine

## 2022-01-23 DIAGNOSIS — J3081 Allergic rhinitis due to animal (cat) (dog) hair and dander: Secondary | ICD-10-CM | POA: Diagnosis not present

## 2022-01-23 DIAGNOSIS — J3089 Other allergic rhinitis: Secondary | ICD-10-CM | POA: Diagnosis not present

## 2022-01-23 DIAGNOSIS — J301 Allergic rhinitis due to pollen: Secondary | ICD-10-CM | POA: Diagnosis not present

## 2022-01-30 DIAGNOSIS — J3089 Other allergic rhinitis: Secondary | ICD-10-CM | POA: Diagnosis not present

## 2022-01-30 DIAGNOSIS — J301 Allergic rhinitis due to pollen: Secondary | ICD-10-CM | POA: Diagnosis not present

## 2022-01-30 DIAGNOSIS — J3081 Allergic rhinitis due to animal (cat) (dog) hair and dander: Secondary | ICD-10-CM | POA: Diagnosis not present

## 2022-02-04 DIAGNOSIS — J301 Allergic rhinitis due to pollen: Secondary | ICD-10-CM | POA: Diagnosis not present

## 2022-02-04 DIAGNOSIS — J3081 Allergic rhinitis due to animal (cat) (dog) hair and dander: Secondary | ICD-10-CM | POA: Diagnosis not present

## 2022-02-04 DIAGNOSIS — J3089 Other allergic rhinitis: Secondary | ICD-10-CM | POA: Diagnosis not present

## 2022-02-05 ENCOUNTER — Other Ambulatory Visit: Payer: Self-pay | Admitting: Nurse Practitioner

## 2022-02-05 DIAGNOSIS — E119 Type 2 diabetes mellitus without complications: Secondary | ICD-10-CM

## 2022-02-09 DIAGNOSIS — M7551 Bursitis of right shoulder: Secondary | ICD-10-CM | POA: Diagnosis not present

## 2022-02-09 DIAGNOSIS — M25511 Pain in right shoulder: Secondary | ICD-10-CM | POA: Diagnosis not present

## 2022-02-12 DIAGNOSIS — J301 Allergic rhinitis due to pollen: Secondary | ICD-10-CM | POA: Diagnosis not present

## 2022-02-12 DIAGNOSIS — J3081 Allergic rhinitis due to animal (cat) (dog) hair and dander: Secondary | ICD-10-CM | POA: Diagnosis not present

## 2022-02-12 DIAGNOSIS — J3089 Other allergic rhinitis: Secondary | ICD-10-CM | POA: Diagnosis not present

## 2022-02-14 ENCOUNTER — Other Ambulatory Visit: Payer: Self-pay | Admitting: Internal Medicine

## 2022-02-17 ENCOUNTER — Other Ambulatory Visit: Payer: Self-pay | Admitting: Nurse Practitioner

## 2022-02-19 DIAGNOSIS — J3089 Other allergic rhinitis: Secondary | ICD-10-CM | POA: Diagnosis not present

## 2022-02-19 DIAGNOSIS — J3081 Allergic rhinitis due to animal (cat) (dog) hair and dander: Secondary | ICD-10-CM | POA: Diagnosis not present

## 2022-02-19 DIAGNOSIS — J301 Allergic rhinitis due to pollen: Secondary | ICD-10-CM | POA: Diagnosis not present

## 2022-02-23 DIAGNOSIS — M7551 Bursitis of right shoulder: Secondary | ICD-10-CM | POA: Diagnosis not present

## 2022-02-25 DIAGNOSIS — M25611 Stiffness of right shoulder, not elsewhere classified: Secondary | ICD-10-CM | POA: Diagnosis not present

## 2022-02-25 DIAGNOSIS — M6281 Muscle weakness (generalized): Secondary | ICD-10-CM | POA: Diagnosis not present

## 2022-02-25 DIAGNOSIS — M25511 Pain in right shoulder: Secondary | ICD-10-CM | POA: Diagnosis not present

## 2022-03-05 ENCOUNTER — Encounter: Payer: Self-pay | Admitting: Nurse Practitioner

## 2022-03-05 ENCOUNTER — Ambulatory Visit (HOSPITAL_COMMUNITY)
Admission: RE | Admit: 2022-03-05 | Discharge: 2022-03-05 | Disposition: A | Discharge status: Home / Self Care | Payer: Medicare HMO | Source: Ambulatory Visit | Attending: Nurse Practitioner | Admitting: Nurse Practitioner

## 2022-03-05 ENCOUNTER — Encounter (HOSPITAL_COMMUNITY): Payer: Self-pay

## 2022-03-05 ENCOUNTER — Ambulatory Visit (INDEPENDENT_AMBULATORY_CARE_PROVIDER_SITE_OTHER): Payer: Medicare HMO | Admitting: Nurse Practitioner

## 2022-03-05 VITALS — BP 130/70 | HR 62 | Ht 61.0 in | Wt 151.6 lb

## 2022-03-05 DIAGNOSIS — I700 Atherosclerosis of aorta: Secondary | ICD-10-CM | POA: Insufficient documentation

## 2022-03-05 DIAGNOSIS — N3090 Cystitis, unspecified without hematuria: Secondary | ICD-10-CM

## 2022-03-05 DIAGNOSIS — R319 Hematuria, unspecified: Secondary | ICD-10-CM | POA: Diagnosis not present

## 2022-03-05 DIAGNOSIS — B009 Herpesviral infection, unspecified: Secondary | ICD-10-CM | POA: Diagnosis not present

## 2022-03-05 DIAGNOSIS — E785 Hyperlipidemia, unspecified: Secondary | ICD-10-CM | POA: Diagnosis not present

## 2022-03-05 DIAGNOSIS — E1169 Type 2 diabetes mellitus with other specified complication: Secondary | ICD-10-CM

## 2022-03-05 LAB — POCT URINALYSIS DIP (CLINITEK)
Bilirubin, UA: NEGATIVE
Glucose, UA: NEGATIVE mg/dL
Ketones, POC UA: NEGATIVE mg/dL
Nitrite, UA: NEGATIVE
POC PROTEIN,UA: 30 — AB
Spec Grav, UA: 1.025 (ref 1.010–1.025)
Urobilinogen, UA: 0.2 E.U./dL
pH, UA: 6 (ref 5.0–8.0)

## 2022-03-05 MED ORDER — NITROFURANTOIN MONOHYD MACRO 100 MG PO CAPS: 100.0000 mg | ORAL_CAPSULE | Freq: Two times a day (BID) | ORAL | 0 refills | Status: AC

## 2022-03-05 MED ORDER — VALACYCLOVIR HCL 500 MG PO TABS: 500.0000 mg | ORAL_TABLET | Freq: Every day | ORAL | 5 refills | Status: DC

## 2022-03-05 MED ORDER — IOHEXOL 300 MG/ML  SOLN
100.0000 mL | Freq: Once | INTRAMUSCULAR | Status: DC | PRN
Start: 2022-03-05 — End: 2022-03-06

## 2022-03-05 NOTE — Progress Notes (Signed)
   Subjective:    Patient ID: [name] [name], female    DOB: 1960-09-18, 61 y.o.   MRN: [id number]  HPI  61 year old female patient presents to clinic today with complaint of blood in urine x 2 days.  Patient denies any dysuria, frequency, urgency, back pain, fever, body aches, chills.  Patient does admit to a headache and mild abdominal just comfort.    Review of Systems  Genitourinary:  Positive for hematuria.  All other systems reviewed and are negative.      Objective:   Physical Exam Vitals reviewed.  Constitutional:      General: She is not in acute distress.    Appearance: Normal appearance. She is normal weight. She is not ill-appearing, toxic-appearing or diaphoretic.  HENT:     Head: Normocephalic and atraumatic.  Cardiovascular:     Rate and Rhythm: Normal rate and regular rhythm.     Pulses: Normal pulses.     Heart sounds: Normal heart sounds. No murmur heard. Pulmonary:     Effort: Pulmonary effort is normal. No respiratory distress.     Breath sounds: Normal breath sounds. No wheezing.  Abdominal:     General: Abdomen is flat. Bowel sounds are normal. There is no distension.     Palpations: Abdomen is soft. There is no mass.     Tenderness: There is no abdominal tenderness. There is no right CVA tenderness, left CVA tenderness, guarding or rebound.     Hernia: No hernia is present.  Musculoskeletal:     Comments: Grossly intact  Skin:    General: Skin is warm.     Capillary Refill: Capillary refill takes less than 2 seconds.  Neurological:     Mental Status: She is alert.     Comments: Grossly intact  Psychiatric:        Mood and Affect: Mood normal.        Behavior: Behavior normal.        Assessment & Plan:   1. Cystitis -Possible cystitis however lack of dysuria is concerning. -Regardless will treat patient prophylactically for cystitis with short course of Macrobid -Urine culture pending - POCT URINALYSIS DIP (CLINITEK) - CMP14+EGFR -  CBC with Differential - Urine Culture - Ambulatory referral to Urology - nitrofurantoin, macrocrystal-monohydrate, (MACROBID) 100 MG capsule; Take 1 capsule (100 mg total) by mouth 2 (two) times daily for 5 days.  Dispense: 10 capsule; Refill: 0 -Return to clinic if symptoms do not improve or worsen -Go to emergency room if symptoms worsen -Return to clinic next week for evaluation   2. Painless hematuria -We will implement hematuria protocol - CT Abdomen Pelvis W Contrast - CT HEMATURIA WORKUP  3.  Herpes simplex -Patient request herpes simplex prophylactic treatment where she just takes 1 pill a day -We will switch patient from acyclovir to Valtrex per patient request - valACYclovir (VALTREX) 500 MG tablet; Take 1 tablet (500 mg total) by mouth daily.  Dispense: 30 tablet; Refill: 5 -We will continue to monitor kidney function and if dramatic decrease is noted we will advised patient to stop taking prophylactic Valtrex.    Note:  This document was prepared using Dragon voice recognition software and may include unintentional dictation errors. Note - This record has been created using [employer].  Chart creation errors have been sought, but may not always  have been located. Such creation errors do not reflect on  the standard of medical care.

## 2022-03-05 NOTE — Progress Notes (Signed)
Blood in urine x 2 days, no pain

## 2022-03-06 LAB — CMP14+EGFR
ALT: 24 IU/L (ref 0–32)
AST: 22 IU/L (ref 0–40)
Albumin/Globulin Ratio: 1.6 (ref 1.2–2.2)
Albumin: 4.5 g/dL (ref 3.8–4.9)
Alkaline Phosphatase: 75 IU/L (ref 44–121)
BUN/Creatinine Ratio: 11 — ABNORMAL LOW (ref 12–28)
BUN: 11 mg/dL (ref 8–27)
Bilirubin Total: 0.4 mg/dL (ref 0.0–1.2)
CO2: 27 mmol/L (ref 20–29)
Calcium: 9.3 mg/dL (ref 8.7–10.3)
Chloride: 103 mmol/L (ref 96–106)
Creatinine, Ser: 0.99 mg/dL (ref 0.57–1.00)
Globulin, Total: 2.8 g/dL (ref 1.5–4.5)
Glucose: 80 mg/dL (ref 70–99)
Potassium: 4.1 mmol/L (ref 3.5–5.2)
Sodium: 143 mmol/L (ref 134–144)
Total Protein: 7.3 g/dL (ref 6.0–8.5)
eGFR: 65 mL/min/{1.73_m2} (ref >59)

## 2022-03-06 LAB — CBC WITH DIFFERENTIAL/PLATELET
Basophils Absolute: 0 10*3/uL (ref 0.0–0.2)
Basos: 0 %
EOS (ABSOLUTE): 0.1 10*3/uL (ref 0.0–0.4)
Eos: 1 %
Hematocrit: 39.9 % (ref 34.0–46.6)
Hemoglobin: 12.9 g/dL (ref 11.1–15.9)
Immature Grans (Abs): 0 10*3/uL (ref 0.0–0.1)
Immature Granulocytes: 0 %
Lymphocytes Absolute: 2.2 10*3/uL (ref 0.7–3.1)
Lymphs: 26 %
MCH: 26.3 pg — ABNORMAL LOW (ref 26.6–33.0)
MCHC: 32.3 g/dL (ref 31.5–35.7)
MCV: 81 fL (ref 79–97)
Monocytes Absolute: 0.5 10*3/uL (ref 0.1–0.9)
Monocytes: 6 %
Neutrophils Absolute: 5.6 10*3/uL (ref 1.4–7.0)
Neutrophils: 67 %
Platelets: 223 10*3/uL (ref 150–450)
RBC: 4.9 x10E6/uL (ref 3.77–5.28)
RDW: 14 % (ref 11.7–15.4)
WBC: 8.5 10*3/uL (ref 3.4–10.8)

## 2022-03-06 LAB — LIPID PANEL
Chol/HDL Ratio: 2.8 ratio (ref 0.0–4.4)
Cholesterol, Total: 195 mg/dL (ref 100–199)
HDL: 70 mg/dL (ref >39)
LDL Chol Calc (NIH): 111 mg/dL — ABNORMAL HIGH (ref 0–99)
Triglycerides: 76 mg/dL (ref 0–149)
VLDL Cholesterol Cal: 14 mg/dL (ref 5–40)

## 2022-03-07 LAB — URINE CULTURE

## 2022-03-07 LAB — SPECIMEN STATUS REPORT

## 2022-03-08 ENCOUNTER — Encounter: Payer: Self-pay | Admitting: Nurse Practitioner

## 2022-03-10 ENCOUNTER — Ambulatory Visit (HOSPITAL_COMMUNITY)
Admission: RE | Admit: 2022-03-10 | Discharge: 2022-03-10 | Disposition: A | Discharge status: Home / Self Care | Payer: Medicare HMO | Source: Ambulatory Visit | Attending: Nurse Practitioner | Admitting: Nurse Practitioner

## 2022-03-10 DIAGNOSIS — R319 Hematuria, unspecified: Secondary | ICD-10-CM | POA: Diagnosis not present

## 2022-03-10 MED ORDER — IOHEXOL 300 MG/ML  SOLN
100.0000 mL | Freq: Once | INTRAMUSCULAR | Status: AC | PRN
  Administered 2022-03-10: 100 mL via INTRAVENOUS

## 2022-03-11 ENCOUNTER — Encounter: Payer: Self-pay | Admitting: Nurse Practitioner

## 2022-03-11 ENCOUNTER — Ambulatory Visit (INDEPENDENT_AMBULATORY_CARE_PROVIDER_SITE_OTHER): Payer: Medicare HMO | Admitting: Nurse Practitioner

## 2022-03-11 ENCOUNTER — Ambulatory Visit: Payer: Medicare HMO | Admitting: Physician Assistant

## 2022-03-11 VITALS — BP 106/70 | HR 72 | Resp 18

## 2022-03-11 VITALS — BP 113/77 | HR 65 | Temp 97.6°F | Wt 151.2 lb

## 2022-03-11 DIAGNOSIS — R319 Hematuria, unspecified: Secondary | ICD-10-CM | POA: Diagnosis not present

## 2022-03-11 DIAGNOSIS — N3001 Acute cystitis with hematuria: Secondary | ICD-10-CM | POA: Diagnosis not present

## 2022-03-11 LAB — URINALYSIS, ROUTINE W REFLEX MICROSCOPIC
Bilirubin, UA: NEGATIVE
Glucose, UA: NEGATIVE
Ketones, UA: NEGATIVE
Nitrite, UA: NEGATIVE
Protein,UA: NEGATIVE
Specific Gravity, UA: 1.015 (ref 1.005–1.030)
Urobilinogen, Ur: 2 mg/dL — ABNORMAL HIGH (ref 0.2–1.0)
pH, UA: 6 (ref 5.0–7.5)

## 2022-03-11 LAB — POCT URINALYSIS DIP (CLINITEK)
Spec Grav, UA: 1.01 (ref 1.010–1.025)
pH, UA: 6.5 (ref 5.0–8.0)

## 2022-03-11 LAB — MICROSCOPIC EXAMINATION: Renal Epithel, UA: NONE SEEN /hpf

## 2022-03-11 MED ORDER — NITROFURANTOIN MONOHYD MACRO 100 MG PO CAPS: 100.0000 mg | ORAL_CAPSULE | Freq: Two times a day (BID) | ORAL | 0 refills | Status: AC

## 2022-03-11 NOTE — Progress Notes (Signed)
   Subjective:    Patient ID: [name] [name], female    DOB: May 23, 1961, 61 y.o.   MRN: [id number]  HPI Pt arrives for follow up on hematuria. Pt states no issues. Pt did have CT completed yesterday which showed no abnormalities to her kidneys, however did show aortic atherosclerosis.  Patient states she has a urology appointment today.  Patient states that overall she feels fine but states that she just feels sick.  Patient denies seeing any more blood in her urine.  However patient does admit to having a headache and that her stomach feels unwell.  Patient denies any nausea, vomiting, abdominal pain just an "unwell" feeling.   Review of Systems  All other systems reviewed and are negative.      Objective:   Physical Exam Vitals reviewed.  Constitutional:      General: She is not in acute distress.    Appearance: Normal appearance. She is normal weight. She is not ill-appearing, toxic-appearing or diaphoretic.  HENT:     Head: Normocephalic and atraumatic.  Cardiovascular:     Rate and Rhythm: Normal rate and regular rhythm.     Pulses: Normal pulses.     Heart sounds: Normal heart sounds. No murmur heard. Pulmonary:     Effort: Pulmonary effort is normal. No respiratory distress.     Breath sounds: Normal breath sounds. No wheezing.  Abdominal:     General: Abdomen is flat. Bowel sounds are normal.     Palpations: Abdomen is soft.     Tenderness: There is no right CVA tenderness or left CVA tenderness.  Musculoskeletal:     Comments: Grossly intact  Skin:    General: Skin is warm.     Capillary Refill: Capillary refill takes less than 2 seconds.  Neurological:     Mental Status: She is alert.     Comments: Grossly intact  Psychiatric:        Mood and Affect: Mood normal.        Behavior: Behavior normal.           Assessment & Plan:   1. Painless hematuria -Follow-up with urology appointment as scheduled - POCT URINALYSIS DIP (CLINITEK) = positive for  blood, leukocytes, protein -Microscopic exam noted microscopic red blood cells -Return to clinic 2 weeks for follow-up -If symptoms worsen or change return to clinic sooner for evaluation    Note:  This document was prepared using Dragon voice recognition software and may include unintentional dictation errors. Note - This record has been created using [employer].  Chart creation errors have been sought, but may not always  have been located. Such creation errors do not reflect on  the standard of medical care.

## 2022-03-11 NOTE — Progress Notes (Signed)
03/11/2022 1:43 PM   [name] [name] [birth year] [id number]   Assessment:  1. Acute cystitis with hematuria - Urinalysis, Routine w reflex microscopic    Plan: Urine culture ordered and prescription for Macrobid sent to the patient's pharmacy.  We will adjust therapy if indicated by culture results.  I had a discussion with the patient regarding the findings of gross and microscopic hematuria including the implications and differential diagnoses associated with it.  I also discussed recommendations for further evaluation including the rationale for cystoscopy to complete formal hematuria evaluation.  I discussed the nature of these procedures including potential risk and complications.  The patient expressed an understanding of these issues.  She will follow-up for first available cystoscopic appointment with Dr. Alyson Ingles.    Chief Complaint: No chief complaint on file.   Referring provider: [facility], [name] Gammon, NP [name] [name],  [name] [name] [id number]   History of Present Illness:  [name] EZELLE is a 61 y.o. year old female who is seen in consultation from [name] [name], [name] Gammon, NP for evaluation of painless hematuria. Pt noted gross hematuria on 03/03/22 without sxs of pain, burning, urgency, frequency. Pt was tx with Macrobid for possible cystitis and cx was negative for bacterial growth of significance-mixed urogenital flora noted. Pt states she first noted gross hematuria approximately 1 year ago but has never had a evaluation.  Currently, she denies symptoms of burning,  frequency, urgency, incontinence.  No pain, fever, chills, nausea.  Patient also reports a long history of feeling that she is passing stool through her vagina but has had imaging and multiple exams indicating no evidence of a rectovaginal fistula.  She is frustrated by the fact that no one has found a reason for her symptoms which have been ongoing for several years.  She denies having had vaginal infections, itching, discharge.  CT hematuria study performed on 03/10/2022 indicated no evidence of urinary calculi or abnormal uptake in the urinary system.  The patient is a former longtime smoker.  Culture on 03/05/2022 grew 10-25 K colonies of mixed urogenital flora.  UA = 6-10 WBCs, 11-30 RBCs, moderate bacteria, nitrite negative  Medical records including notes, laboratory studies, and imaging reviewed during the patient's office visit.  Past Medical History:  Past Medical History:  Diagnosis Date   Allergic rhinitis    Anemia, unspecified 05/30/2013   Anxiety    Breast cancer ([place]) 03/31/13   left   Cancer ([place])    Chest pain 2009   Consultation-[facility], negative chest CT; nl echo in 2005; h/o palpitations   Chiari malformation type I ([place])    s/p suboccipital craniectomy, C1 laminectomy, superior C2 laminectomy, duraplasty 05/10/14   Colitis 2010   not IBD   Degenerative joint disease    + degenerative joint disease of the lumbosacral spine   Depression    Diabetes ([place])    Diabetes ([place])    Dysrhythmia    palpations   GERD (gastroesophageal reflux disease)    "a little"   Headache    Heart murmur    "small"   Herpes simplex type II infection    Hypercholesteremia    "slightly high"   Hypertension    does not have high blood pressure   Meningitis 08/22/2014   Palpitations    Personal history of chemotherapy    Personal history of radiation therapy    Wears glasses     Past Surgical History:  Past Surgical History:  Procedure Laterality Date  ABDOMINAL HYSTERECTOMY   03/13/2007   TAH ?BSO--Dr Levin Bacon, [place]   BIOPSY  01/14/2021   Procedure: BIOPSY;  Surgeon: Eloise Harman, DO;  [facility];  Service: Endoscopy;;   BREAST EXCISIONAL BIOPSY  04/2003   Left; benign disease   BREAST LUMPECTOMY Left 09/2013   BREAST LUMPECTOMY WITH NEEDLE LOCALIZATION AND AXILLARY LYMPH NODE DISSECTION Left 09/12/2013   Procedure: BREAST LUMPECTOMY WITH NEEDLE LOCALIZATION AND AXILLARY LYMPH NODE DISSECTION;  Surgeon: [name], MD;  Location: [facility];  Service: General;  Laterality: Left;  and axilla   BREAST SURGERY     COLONOSCOPY  10/2010   proctitis; melanosis coli   COLONOSCOPY WITH PROPOFOL N/A 01/04/2018   Dr. [name]: hemorrhoids, follow up colonoscopy in 10 years   EAR CYST EXCISION Right 09/04/2019   Procedure: EXCISION EAR MASS;  Surgeon: [name], MD;  Location: [facility];  Service: ENT;  Laterality: Right;   ESOPHAGOGASTRODUODENOSCOPY (EGD) WITH PROPOFOL N/A 01/14/2021   Procedure: ESOPHAGOGASTRODUODENOSCOPY (EGD) WITH PROPOFOL;  Surgeon: Eloise Harman, DO;  [facility];  Service: Endoscopy;  Laterality: N/A;  9:00am   EXCISION/RELEASE BURSA HIP Left 03/17/2019   Procedure: Left hip open bursectomy;  Surgeon: Nicholes Stairs, MD;  Location: [facility];  Service: Orthopedics;  Laterality: Left;  75 mins   EYE SURGERY Left    "fix lazy eye"   PORTACATH PLACEMENT Right 04/21/2013   Procedure: INSERTION PORT-A-CATH;  Surgeon: [name], MD;  Location: [facility];  Service: General;  Laterality: Right;   RIGHT OOPHORECTOMY  03/13/2007   SUBOCCIPITAL CRANIECTOMY CERVICAL LAMINECTOMY N/A 05/10/2014   Procedure: SUBOCCIPITAL CRANIECTOMY CERVICAL LAMINECTOMY/DURAPLASTY;  Surgeon: [name], MD;  Location: [facility];  Service: Neurosurgery;  Laterality: N/A;  suboccipital craniectomy with cervical laminectomy and duraplasty   TUBAL LIGATION      Allergies:  Allergies  Allergen Reactions    Doxycycline Itching and Swelling    Family History:  Family History  Problem Relation Age of Onset   Aneurysm Mother        Cerebral   Hypertension Mother    Hyperlipidemia Mother    Stroke Mother    Coronary artery disease Father    Hypertension Father    Hyperlipidemia Father    Heart attack Father        d. 17   Lung cancer Father        dx early 44s; former smoker   Lung cancer Brother    Lung cancer Brother        d. 13y; smoker   Lung cancer Sister        paternal half-sister; not a smoker; dx <60; d. 59y   Leukemia Maternal Aunt        "blood cancer"; d. unspecified age   Cervical cancer Paternal Aunt        d. late 54s   Breast cancer Sister 19   Other Sister        hx of hysterectomy for unspecified reason   Multiple myeloma Brother        d. 63s   Lung cancer Brother        dx late 31s; smoker   Prostate cancer Brother        dx late 67s   Prostate cancer Brother        dx late 84s   Lung cancer Brother    Lung cancer Maternal Aunt  d. unspecified age   Breast cancer Maternal Aunt        dx unspecified age   Prostate cancer Cousin        maternal 1st cousin; dx older age   Breast cancer Other        niece dx early 39s or before   Leukemia Other        nephew d. 2y; "blood cancer"   Colon cancer Neg Hx    Diabetes Neg Hx     Social History:  Social History   Tobacco Use   Smoking status: Former    Packs/day: 0.50    Years: 15.00    Total pack years: 7.50    Types: Cigarettes    Quit date: 07/06/1982    Years since quitting: 39.7   Smokeless tobacco: Never  Vaping Use   Vaping Use: Never used  Substance Use Topics   Alcohol use: No   Drug use: No    Review of symptoms:  Constitutional:  Negative for unexplained weight loss, night sweats, fever, chills ENT:  Negative for nose bleeds, sinus pain, painful swallowing CV:  Negative for chest pain, shortness of breath, exercise intolerance, palpitations, loss of consciousness Resp:   Negative for cough, wheezing, shortness of breath GI:  Negative for vomiting, diarrhea, bloody stools GU:  Positives noted in HPI Neuro:  Negative for seizures, poor balance, limb weakness, slurred speech Endocrine:  Negative for polydipsia, polyuria, symptoms of hypoglycemia (dizziness, hunger, sweating) Hematologic:  Negative for anemia, purpura, petechia, prolonged or excessive bleeding, use of anticoagulants   Physical Exam: BP 106/70   Pulse 72   Resp 18   LMP 12/04/2008   Constitutional:  Alert and oriented, No acute distress. HEENT: NCAT, moist mucus membranes.  Trachea midline, no masses. Cardiovascular: Regular rate and rhythm with murmur noted, no rub or gallops No clubbing, cyanosis, or edema. Respiratory: Normal respiratory effort, clear to auscultation bilaterally GI: Abdomen is soft, nontender, nondistended, no abdominal masses BACK:  Non-tender to palpation.  No CVAT Skin: No obvious rashes, warm, dry, intact Neurologic: Alert and oriented, Cranial nerves grossly intact, no focal deficits, moving all 4 extremities. Psychiatric: Appropriate. Normal mood and affect.  Laboratory Data: Results for orders placed or performed in visit on 03/11/22 (from the past 24 hour(s))  POCT URINALYSIS DIP (CLINITEK)   Collection Time: 03/11/22  9:57 AM  Result Value Ref Range   Color, UA     Clarity, UA     Glucose, UA     Bilirubin, UA     Ketones, POC UA     Spec Grav, UA 1.010 1.010 - 1.025   Blood, UA large (A) negative   pH, UA 6.5 5.0 - 8.0   POC PROTEIN,UA trace negative, trace   Urobilinogen, UA     Nitrite, UA     Leukocytes, UA Trace (A) Negative   *Note: Due to a large number of results and/or encounters for the requested time period, some results have not been displayed. A complete set of results can be found in Results Review.    Lab Results  Component Value Date   WBC 8.5 03/05/2022   HGB 12.9 03/05/2022   HCT 39.9 03/05/2022   MCV 81 03/05/2022   PLT 223  03/05/2022    Lab Results  Component Value Date   CREATININE 0.99 03/05/2022    Lab Results  Component Value Date   HGBA1C 5.9 (H) 11/25/2021    Urinalysis    Component Value  Date/Time   COLORURINE YELLOW 04/15/2016 [place] 04/15/2016 1515   LABSPEC 1.025 04/15/2016 1515   LABSPEC 1.015 09/07/2013 1136   PHURINE 5.5 04/15/2016 1515   GLUCOSEU NEGATIVE 04/15/2016 1515   GLUCOSEU Negative 09/07/2013 1136   HGBUR TRACE (A) 04/15/2016 1515   BILIRUBINUR negative 03/05/2022 0952   BILIRUBINUR ++ 04/03/2016 1054   BILIRUBINUR Negative 09/07/2013 1136   KETONESUR negative 03/05/2022 0952   KETONESUR 15 (A) 04/15/2016 1515   PROTEINUR neg 11/19/2016 1448   PROTEINUR NEGATIVE 04/15/2016 1515   UROBILINOGEN 0.2 03/05/2022 0952   UROBILINOGEN 1.0 08/22/2014 1309   UROBILINOGEN 0.2 09/07/2013 1136   NITRITE Negative 03/05/2022 0952   NITRITE neg 11/19/2016 1448   NITRITE NEGATIVE 04/15/2016 1515   LEUKOCYTESUR Trace (A) 03/11/2022 0957   LEUKOCYTESUR Trace 09/07/2013 1136    Lab Results  Component Value Date   LABMICR <3.0 11/25/2021   BACTERIA FEW (A) 04/15/2016    Pertinent Imaging: No results found for this or any previous visit.  No results found for this or any previous visit.     [place], Berneice Heinrich, PA-C [facility] [place]

## 2022-03-12 ENCOUNTER — Ambulatory Visit: Payer: Medicare HMO | Admitting: Nurse Practitioner

## 2022-03-12 DIAGNOSIS — T783XXD Angioneurotic edema, subsequent encounter: Secondary | ICD-10-CM | POA: Diagnosis not present

## 2022-03-12 DIAGNOSIS — J3089 Other allergic rhinitis: Secondary | ICD-10-CM | POA: Diagnosis not present

## 2022-03-12 DIAGNOSIS — J3081 Allergic rhinitis due to animal (cat) (dog) hair and dander: Secondary | ICD-10-CM | POA: Diagnosis not present

## 2022-03-12 DIAGNOSIS — J301 Allergic rhinitis due to pollen: Secondary | ICD-10-CM | POA: Diagnosis not present

## 2022-03-12 LAB — POCT I-STAT CREATININE: Creatinine, Ser: 1.1 mg/dL — ABNORMAL HIGH (ref 0.44–1.00)

## 2022-03-13 ENCOUNTER — Telehealth: Payer: Self-pay

## 2022-03-13 LAB — URINE CULTURE

## 2022-03-13 NOTE — Telephone Encounter (Signed)
-----   Message from [facility], [place] sent at 03/13/2022 11:55 AM EDT ----- Please let pt know her culture grew insignificant numbers of bacteria and is considered negative. She may complete current Rx. No change indicated.  ----- Message ----- From: Lavone Neri Lab Results In Sent: 03/11/2022   4:37 PM EDT To: Reynaldo Minium, PA-C

## 2022-03-13 NOTE — Telephone Encounter (Signed)
Made patient aware that her culture was negative and no change indicated. Patient voiced understanding.

## 2022-03-17 ENCOUNTER — Ambulatory Visit: Payer: Medicare HMO | Admitting: Urology

## 2022-03-17 ENCOUNTER — Encounter: Payer: Self-pay | Admitting: Urology

## 2022-03-17 VITALS — BP 102/59 | HR 64

## 2022-03-17 DIAGNOSIS — N3001 Acute cystitis with hematuria: Secondary | ICD-10-CM | POA: Diagnosis not present

## 2022-03-17 DIAGNOSIS — R310 Gross hematuria: Secondary | ICD-10-CM | POA: Diagnosis not present

## 2022-03-17 LAB — MICROSCOPIC EXAMINATION
Bacteria, UA: NONE SEEN
Renal Epithel, UA: NONE SEEN /hpf

## 2022-03-17 LAB — URINALYSIS, ROUTINE W REFLEX MICROSCOPIC
Bilirubin, UA: NEGATIVE
Glucose, UA: NEGATIVE
Ketones, UA: NEGATIVE
Nitrite, UA: NEGATIVE
Protein,UA: NEGATIVE
Specific Gravity, UA: 1.015 (ref 1.005–1.030)
Urobilinogen, Ur: 1 mg/dL (ref 0.2–1.0)
pH, UA: 6 (ref 5.0–7.5)

## 2022-03-17 MED ORDER — CIPROFLOXACIN HCL 500 MG PO TABS
500.0000 mg | ORAL_TABLET | Freq: Once | ORAL | Status: AC
  Administered 2022-03-17: 500 mg via ORAL

## 2022-03-17 NOTE — Patient Instructions (Signed)

## 2022-03-17 NOTE — Progress Notes (Signed)
   03/17/22  CC: gross hematuria   HPI: [name] [name] is a 61yo here for cystoscopy for gross hematuria Blood pressure (!) 102/59, pulse 64, last menstrual period 12/04/2008. NED. A&Ox3.   No respiratory distress   Abd soft, NT, ND Normal external genitalia with patent urethral meatus  Cystoscopy Procedure Note  Patient identification was confirmed, informed consent was obtained, and patient was prepped using Betadine solution.  Lidocaine jelly was administered per urethral meatus.    Procedure: - Flexible cystoscope introduced, without any difficulty.   - Thorough search of the bladder revealed:    normal urethral meatus    normal urothelium    no stones    no ulcers     no tumors    no urethral polyps    no trabeculation  - Ureteral orifices were normal in position and appearance.  Post-Procedure: - Patient tolerated the procedure well  Assessment/ Plan: RTC 6 months with UA   No follow-ups on file.  Nicolette Bang, MD

## 2022-03-19 ENCOUNTER — Other Ambulatory Visit: Payer: Self-pay | Admitting: Nurse Practitioner

## 2022-03-19 DIAGNOSIS — E119 Type 2 diabetes mellitus without complications: Secondary | ICD-10-CM

## 2022-03-25 ENCOUNTER — Encounter: Payer: Self-pay | Admitting: Nurse Practitioner

## 2022-03-25 ENCOUNTER — Ambulatory Visit (INDEPENDENT_AMBULATORY_CARE_PROVIDER_SITE_OTHER): Payer: Medicare HMO | Admitting: Nurse Practitioner

## 2022-03-25 VITALS — BP 105/69 | HR 60 | Ht 61.0 in | Wt 150.4 lb

## 2022-03-25 DIAGNOSIS — E785 Hyperlipidemia, unspecified: Secondary | ICD-10-CM

## 2022-03-25 DIAGNOSIS — R319 Hematuria, unspecified: Secondary | ICD-10-CM | POA: Diagnosis not present

## 2022-03-25 DIAGNOSIS — E1169 Type 2 diabetes mellitus with other specified complication: Secondary | ICD-10-CM | POA: Diagnosis not present

## 2022-03-25 DIAGNOSIS — J3089 Other allergic rhinitis: Secondary | ICD-10-CM | POA: Diagnosis not present

## 2022-03-25 DIAGNOSIS — J3081 Allergic rhinitis due to animal (cat) (dog) hair and dander: Secondary | ICD-10-CM | POA: Diagnosis not present

## 2022-03-25 DIAGNOSIS — J301 Allergic rhinitis due to pollen: Secondary | ICD-10-CM | POA: Diagnosis not present

## 2022-03-25 MED ORDER — ROSUVASTATIN CALCIUM 20 MG PO TABS: 20.0000 mg | ORAL_TABLET | Freq: Every day | ORAL | 3 refills | Status: DC

## 2022-03-25 NOTE — Progress Notes (Signed)
Filmy dilated  Subjective:    Patient ID: [name] [name], female    DOB: Sep 14, 1960, 61 y.o.   MRN: [id number]  HPI  Patient arrives for a follow up on hematuria. Patient states she saw urology and they told her she had a broken blood vessel in her bladder causing the hematuria and to follow up in 6 months to make sure it is resolved.  Today patient states that she feels a lot better.  Patient denies any UTI symptoms.  No dysuria no frequency no urgency.  Review of Systems  All other systems reviewed and are negative.      Objective:   Physical Exam Vitals reviewed.  Constitutional:      General: She is not in acute distress.    Appearance: Normal appearance. She is normal weight. She is not ill-appearing, toxic-appearing or diaphoretic.  HENT:     Head: Normocephalic and atraumatic.  Cardiovascular:     Rate and Rhythm: Normal rate and regular rhythm.     Pulses: Normal pulses.     Heart sounds: Normal heart sounds. No murmur heard. Pulmonary:     Effort: Pulmonary effort is normal. No respiratory distress.     Breath sounds: Normal breath sounds. No wheezing.  Musculoskeletal:     Comments: Grossly intact  Skin:    General: Skin is warm.     Capillary Refill: Capillary refill takes less than 2 seconds.  Neurological:     Mental Status: She is alert.     Comments: Grossly intact  Psychiatric:        Mood and Affect: Mood normal.        Behavior: Behavior normal.           Assessment & Plan:   1. Painless hematuria -Continue to follow-up with urology as scheduled  2. Hyperlipidemia associated with type 2 diabetes mellitus (HCC) -Last LDL 111.  Goal for patient is less than 70 due to her being diabetic. -We will stop Crestor 10 mg and start Crestor 20 mg -We will have patient repeat CMP and lipid profile in 4 weeks as well as get A1c. - CMP14+EGFR - Lipid Profile - rosuvastatin (CRESTOR) 20 MG tablet; Take 1 tablet (20 mg total) by mouth daily.  Dispense:  90 tablet; Refill: 3 - Hemoglobin A1c -Patient also had question about fatty liver.  And instructed patient to follow-up with gastro for follow-up of fatty liver    Note:  This document was prepared using Dragon voice recognition software and may include unintentional dictation errors. Note - This record has been created using [employer].  Chart creation errors have been sought, but may not always  have been located. Such creation errors do not reflect on  the standard of medical care.

## 2022-04-03 DIAGNOSIS — J301 Allergic rhinitis due to pollen: Secondary | ICD-10-CM | POA: Diagnosis not present

## 2022-04-03 DIAGNOSIS — J3089 Other allergic rhinitis: Secondary | ICD-10-CM | POA: Diagnosis not present

## 2022-04-03 DIAGNOSIS — J3081 Allergic rhinitis due to animal (cat) (dog) hair and dander: Secondary | ICD-10-CM | POA: Diagnosis not present

## 2022-04-08 ENCOUNTER — Encounter: Payer: Self-pay | Admitting: Allergy & Immunology

## 2022-04-08 ENCOUNTER — Ambulatory Visit (INDEPENDENT_AMBULATORY_CARE_PROVIDER_SITE_OTHER): Payer: Medicare HMO | Admitting: Allergy & Immunology

## 2022-04-08 VITALS — BP 106/70 | HR 72 | Temp 98.2°F | Resp 16 | Ht 60.5 in | Wt 148.5 lb

## 2022-04-08 DIAGNOSIS — J3089 Other allergic rhinitis: Secondary | ICD-10-CM | POA: Diagnosis not present

## 2022-04-08 DIAGNOSIS — J302 Other seasonal allergic rhinitis: Secondary | ICD-10-CM

## 2022-04-08 NOTE — Progress Notes (Signed)
NEW PATIENT  Date of Service/Encounter:  04/08/22  Consult requested by: AmeduiteTrenton Gammon, NP   Assessment:   Seasonal and perennial allergic rhinitis (trees, dust mites, and cockroach)  Plan/Recommendations:   1. Seasonal and perennial allergic rhinitis - Testing today showed: trees, dust mites, and cockroach. - Copy of test results provided.  - Avoidance measures provided. - Continue with: Xyzal (levocetirizine) 36m tablet once daily - You can use an extra dose of the antihistamine, if needed, for breakthrough symptoms.  - Consider nasal saline rinses 1-2 times daily to remove allergens from the nasal cavities as well as help with mucous clearance (this is especially helpful to do before the nasal sprays are given) -  We will ignore the cockroach since this does not seem to be a relevant allergen. - This will allow uKoreato only mix ONE vial so that we can do one shot.  - Make an appointment to start in a couple of weeks.  2. Return in about 3 months (around 07/09/2022).     This note in its entirety was forwarded to the Provider who requested this consultation.  Subjective:   MTASHIBA TIMONEYis a 61y.o. 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   Herpes simplex 06/09/2021   NAFLD (nonalcoholic fatty liver disease) 05/14/2021   Abdominal pain, chronic, epigastric 12/25/2020   Psychophysiological insomnia 08/23/2020   Incontinence of feces with fecal urgency 02/14/2020   Patellofemoral syndrome of both knees 11/21/2019   Arthritis of left hip 05/14/2019   Encounter for orthopedic follow-up care 03/30/2019   Impingement syndrome of left shoulder region 02/15/2019   Pain in joint  of left shoulder 02/14/2019   Pain of left hip joint 02/14/2019   Bloating 06/01/2018   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   RUQ pain 10/21/2017   LLQ pain 10/21/2017   Nausea without vomiting 10/21/2017   Paresthesias 05/14/2017   Hyperlipidemia associated with type 2 diabetes mellitus ([place] 03/22/2017   Dyslipidemia due to type 2 diabetes mellitus ([place] 03/22/2017   Genetic testing 07/01/2016   Abnormal chromosomal and genetic finding on antenatal screening mother 07/01/2016   Lipoma of back 06/03/2016   Reactive hypoglycemia 06/03/2016   Family history of breast cancer in female 05/22/2016   Diabetes ([place]    IBS (irritable bowel syndrome) 12/10/2015   Depression 08/23/2014   Anxiety 08/23/2014   Heart murmur 08/23/2014   Hypercholesteremia 08/23/2014   History of Chiari malformation 08/23/2014   Meningitis 08/22/2014   Fever    Vaginal dryness 07/16/2014   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/10/2014   Joint pain 10/02/2013   Neuropathic pain 10/02/2013   Onychomycosis of toenail 10/02/2013   Frequent headaches 06/19/2013   Constipation 06/19/2013   Headache, frequent episodic tension-type 06/19/2013   Anxiety as acute reaction to exceptional stress 05/22/2013   Malignant neoplasm of upper-outer quadrant of left breast in female, estrogen receptor positive ([place] 04/06/2013   Rectovaginal fistula, 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  Jasmyne Rae Lips was referred by Ameduite, Trenton Gammon, NP.     Lileigh is a 61 y.o. female presenting for an evaluation of environmental allergies .   She is currently followed by Dr. Orvil Feil. She is on shots  and getting them once weekly. They are helping. She gets two injections. She is in her [employer]. She does have some itching and swelling around each shot but otherwise no problems. She has never needed epinephrine.  She is currently taking levocetirizine for her allergies. She is not using a nose spray. Worst time of the year is probably fall.   She did not get allergies until she was in her 4s. She did allergy shots at that time. She was going to a place in [place] in her 51s which is no longer there. They helped her then. She was off shots for around 20 or more years before starting again.   She does not get infections much at all. She does not have asthma or food allergies at all. She is otherwise doing well.   She has two  foster children - ages 71 and 6. She is going to be adopting one of them.   Otherwise, there is no history of other atopic diseases, including asthma, food allergies, drug allergies, stinging insect allergies, eczema, urticaria, or contact dermatitis. There is no significant infectious history. Vaccinations are up to date.    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   Herpes simplex 06/09/2021   NAFLD (nonalcoholic fatty liver disease) 05/14/2021   Abdominal pain, chronic, epigastric 12/25/2020   Psychophysiological insomnia 08/23/2020   Incontinence of feces with fecal urgency 02/14/2020   Patellofemoral syndrome of both knees 11/21/2019   Arthritis of left hip 05/14/2019   Encounter for orthopedic follow-up care 03/30/2019   Impingement syndrome of left shoulder region 02/15/2019   Pain in joint of left shoulder 02/14/2019   Pain of left hip joint 02/14/2019   Bloating 06/01/2018   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   RUQ pain 10/21/2017   LLQ pain 10/21/2017   Nausea without vomiting 10/21/2017   Paresthesias 05/14/2017   Hyperlipidemia associated with type 2 diabetes  mellitus ([place]) 03/22/2017   Dyslipidemia due to type 2 diabetes mellitus ([place]) 03/22/2017   Genetic testing 07/01/2016   Abnormal chromosomal and genetic finding on antenatal screening mother 07/01/2016   Lipoma of back 06/03/2016   Reactive hypoglycemia 06/03/2016   Family history of breast cancer in female 05/22/2016   Diabetes ([place])    IBS (irritable bowel syndrome) 12/10/2015   Depression 08/23/2014   Anxiety 08/23/2014   Heart murmur 08/23/2014   Hypercholesteremia 08/23/2014   History of Chiari malformation 08/23/2014   Meningitis 08/22/2014   Fever    Vaginal dryness 07/16/2014   Chiari I malformation ([place]) 05/10/2014   Joint pain 10/02/2013   Neuropathic pain 10/02/2013   Onychomycosis of toenail 10/02/2013   Frequent headaches 06/19/2013   Constipation 06/19/2013   Headache, frequent episodic tension-type 06/19/2013   Anxiety as acute reaction to exceptional stress 05/22/2013   Malignant neoplasm of upper-outer quadrant of left breast in female, estrogen receptor positive ([place]) 04/06/2013   Rectovaginal fistula, 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 If you have any questions, ask your nurse or doctor.          atenolol 50 MG tablet Commonly known as: TENORMIN Take 1 tablet (50 mg total) by mouth daily.   blood glucose meter kit and supplies Dispense based on patient and insurance preference. Use up to test glucose once daily. For ICD -10 E11.9.   blood glucose meter kit and supplies Kit Dispense  based on patient and insurance preference. Use up to four times daily as directed.   diphenhydramine-acetaminophen 25-500 MG Tabs tablet Commonly known as: TYLENOL PM Take 1 tablet by mouth at bedtime as needed (sleep).   EPINEPHrine 0.3 mg/0.3 mL Soaj injection Commonly known as: EPI-PEN Inject 0.3 mg into the muscle as needed for anaphylaxis.   escitalopram 20 MG tablet Commonly known as: LEXAPRO TAKE 1 TABLET BY MOUTH EVERY DAY   famotidine 40 MG tablet Commonly known as: PEPCID TAKE 1 TABLET BY MOUTH EVERYDAY AT BEDTIME   levocetirizine 5 MG tablet Commonly known as: XYZAL Take 5 mg by mouth every evening.   LORazepam 0.5 MG tablet Commonly known as: ATIVAN Take 1 tablet (0.5 mg total) by mouth at bedtime as needed for sleep.   Melatonin 10 MG Tabs Take 10 mg by mouth at bedtime.   methocarbamol 500 MG tablet Commonly known as: ROBAXIN Take 500 mg by mouth daily as needed for muscle spasms.   ondansetron 4 MG disintegrating tablet Commonly known as: ZOFRAN-ODT Take 1 tablet (4 mg total) by mouth every 8 (eight) hours as needed for nausea or vomiting.   OneTouch Delica Plus [id number] Misc USE TO TEST BLOOD GLUCOSE DAILY   OneTouch Ultra test strip Generic drug: glucose blood USE TO CHECK BLOOD SUGAR THREE TIMES A DAY   Ozempic (0.25 or 0.5 MG/DOSE) 2 MG/3ML Sopn Generic drug: Semaglutide(0.25 or 0.5MG/DOS) INJECT 0.5 MG INTO THE SKIN ONCE A WEEK.   pantoprazole 40 MG tablet Commonly known as: PROTONIX TAKE 1 TABLET (40 MG TOTAL) BY MOUTH 2 (TWO) TIMES DAILY BEFORE A MEAL.   Pen Needles 5/16" 30G X 8 MM Misc Use with victoza   rosuvastatin 20 MG tablet Commonly known as: Crestor Take 1 tablet (20 mg total) by mouth daily.   valACYclovir 500 MG tablet Commonly known as: VALTREX Take 1 tablet (500 mg total) by mouth daily.        Birth History: non-contributory  Developmental History: non-contributory  Past Surgical History: Past Surgical  History:  Procedure Laterality Date   ABDOMINAL HYSTERECTOMY  03/13/2007   TAH ?BSO--Dr Levin Bacon, [place]   BIOPSY  01/14/2021   Procedure: BIOPSY;  Surgeon: Eloise Harman, DO;  [facility];  Service: Endoscopy;;   BREAST EXCISIONAL BIOPSY  04/2003   Left; benign disease   BREAST LUMPECTOMY Left 09/2013   BREAST LUMPECTOMY WITH NEEDLE LOCALIZATION AND AXILLARY LYMPH NODE DISSECTION Left 09/12/2013   Procedure: BREAST LUMPECTOMY WITH NEEDLE LOCALIZATION AND AXILLARY LYMPH NODE DISSECTION;  Surgeon: [name], MD;  Location: [facility];  Service:  General;  Laterality: Left;  and axilla   BREAST SURGERY     COLONOSCOPY  10/2010   proctitis; melanosis coli   COLONOSCOPY WITH PROPOFOL N/A 01/04/2018   Dr. [name]: hemorrhoids, follow up colonoscopy in 10 years   EAR CYST EXCISION Right 09/04/2019   Procedure: EXCISION EAR MASS;  Surgeon: [name], MD;  Location: [facility];  Service: ENT;  Laterality: Right;   ESOPHAGOGASTRODUODENOSCOPY (EGD) WITH PROPOFOL N/A 01/14/2021   Procedure: ESOPHAGOGASTRODUODENOSCOPY (EGD) WITH PROPOFOL;  Surgeon: Eloise Harman, DO;  [facility];  Service: Endoscopy;  Laterality: N/A;  9:00am   EXCISION/RELEASE BURSA HIP Left 03/17/2019   Procedure: Left hip open bursectomy;  Surgeon: Nicholes Stairs, MD;  Location: [facility];  Service: Orthopedics;  Laterality: Left;  75 mins   EYE SURGERY Left    "fix lazy eye"   PORTACATH PLACEMENT Right 04/21/2013   Procedure: INSERTION PORT-A-CATH;  Surgeon: [name], MD;  [facility];  Service: General;  Laterality: Right;   RIGHT OOPHORECTOMY  03/13/2007   SUBOCCIPITAL CRANIECTOMY CERVICAL LAMINECTOMY N/A 05/10/2014   Procedure: SUBOCCIPITAL CRANIECTOMY CERVICAL LAMINECTOMY/DURAPLASTY;  Surgeon: [name], MD;  Location: [facility];  Service: Neurosurgery;  Laterality: N/A;  suboccipital craniectomy with cervical laminectomy and duraplasty   TUBAL  LIGATION       Family History: Family History  Problem Relation Age of Onset   Aneurysm Mother        Cerebral   Hypertension Mother    Hyperlipidemia Mother    Stroke Mother    Coronary artery disease Father    Hypertension Father    Hyperlipidemia Father    Heart attack Father        d. 34   Lung cancer Father        dx early 39s; former smoker   Lung cancer Sister        paternal half-sister; not a smoker; dx <60; d. 62y   Breast cancer Sister 33   Other Sister        hx of hysterectomy for unspecified reason   Eczema Brother    Lung cancer Brother    Lung cancer Brother        d. 50y; smoker   Multiple myeloma Brother        d. 75s   Lung cancer Brother        dx late 75s; smoker   Prostate cancer Brother        dx late 46s   Prostate cancer Brother        dx late 53s   Lung cancer Brother    Leukemia Maternal Aunt        "blood cancer"; d. unspecified age   Lung cancer Maternal Aunt        d. unspecified age   Breast cancer Maternal Aunt        dx unspecified age   Cervical cancer Paternal Aunt        d. late 61s   Prostate cancer Cousin        maternal 1st cousin; dx older age   Breast cancer Other        niece dx early 8s or before   Leukemia Other        nephew d. 2y; "blood cancer"   Colon cancer Neg Hx    Diabetes Neg Hx      Social History: Amiria lives at home with her family in a house.  There is carpeting throughout the home.  They have electric heating and central cooling.  There is a small dog inside of the home.  There are dust mite covers on the bed, but not the pillows.  There is no tobacco exposure.  There is no fume, chemical, or dust exposure.  She does not have a HEPA filter.  They do not live near an interstate or industrial area.    Review of Systems  Constitutional: Negative.  Negative for fever, malaise/fatigue and weight loss.  HENT:  Positive for congestion, sinus pain and sore throat. Negative for ear discharge and ear pain.    Eyes:  Negative for pain, discharge and redness.  Respiratory:  Negative for cough, sputum production, shortness of breath and wheezing.   Cardiovascular: Negative.  Negative for chest pain and palpitations.  Gastrointestinal:  Negative for abdominal pain, heartburn, nausea and vomiting.  Skin: Negative.  Negative for itching and rash.  Neurological:  Negative for dizziness and headaches.  Endo/Heme/Allergies:  Negative for environmental allergies. Does not bruise/bleed easily.       Objective:   Blood pressure 106/70, pulse 72, temperature 98.2 F (36.8 C), resp. rate 16, height 5' 0.5" (1.537 m), weight 148 lb 8 oz (67.4 kg), last menstrual period 12/04/2008, SpO2 97 %. Body mass index is 28.52 kg/m.     Physical Exam Vitals reviewed.  Constitutional:      Appearance: She is well-developed.  HENT:     Head: Normocephalic and atraumatic.     Right Ear: Tympanic membrane, ear canal and external ear normal. No drainage, swelling or tenderness. Tympanic membrane is not injected, scarred, erythematous, retracted or bulging.     Left Ear: Tympanic membrane, ear canal and external ear normal. No drainage, swelling or tenderness. Tympanic membrane is not injected, scarred, erythematous, retracted or bulging.     Nose: No nasal deformity, septal deviation, mucosal edema or rhinorrhea.     Right Turbinates: Enlarged, swollen and pale.     Left Turbinates: Enlarged, swollen and pale.     Right Sinus: No maxillary sinus tenderness or frontal sinus tenderness.     Left Sinus: No maxillary sinus tenderness or frontal sinus tenderness.     Mouth/Throat:     Mouth: Mucous membranes are not pale and not dry.     Pharynx: Uvula midline.  Eyes:     General:        Right eye: No discharge.        Left eye: No discharge.     Conjunctiva/sclera: Conjunctivae normal.     Right eye: Right conjunctiva is not injected. No chemosis.    Left eye: Left conjunctiva is not injected. No chemosis.     Pupils: Pupils are equal, round, and reactive to light.  Cardiovascular:     Rate and Rhythm: Normal rate and regular rhythm.     Heart sounds: Normal heart sounds.  Pulmonary:     Effort: Pulmonary effort is normal. No tachypnea, accessory muscle usage or respiratory distress.     Breath sounds: Normal breath sounds. No wheezing, rhonchi or rales.  Chest:     Chest wall: No tenderness.  Abdominal:     Tenderness: There is no abdominal tenderness. There is no guarding or rebound.  Lymphadenopathy:     Head:     Right side of head: No submandibular, tonsillar or occipital adenopathy.     Left side of head: No submandibular, tonsillar or occipital adenopathy.     Cervical: No  cervical adenopathy.  Skin:    Coloration: Skin is not pale.     Findings: No abrasion, erythema, petechiae or rash. Rash is not papular, urticarial or vesicular.  Neurological:     Mental Status: She is alert.  Psychiatric:        Behavior: Behavior is cooperative.      Diagnostic studies:   Allergy Studies:     Airborne Adult Perc - 04/08/22 1006     Time Antigen Placed 1006    Allergen Manufacturer Lavella Hammock    Location Back    Number of Test 59    1. Control-Buffer 50% Glycerol Negative    2. Control-Histamine 1 mg/ml 3+    3. Albumin saline Negative    4. [place] Negative    5. [place] Negative    6. Johnson Negative    7. [place] Blue Negative    8. Meadow Fescue Negative    9. Perennial Rye Negative    10. Sweet Vernal Negative    11. Timothy Negative    12. Cocklebur Negative    13. Burweed Marshelder Negative    14. Ragweed, short Negative    15. Ragweed, Giant Negative    16. Plantain,  English Negative    17. Lamb's Quarters Negative    18. Sheep Sorrell Negative    19. Rough Pigweed Negative    20. Marsh Elder, Rough Negative    21. Mugwort, Common Negative    22. Ash mix Negative    23. Birch mix Negative    24. Beech American Negative    25. Box, Elder Negative    26. Cedar,  red Negative    27. Cottonwood, [place] Negative    28. Elm mix Negative    29. Hickory Negative    30. Maple mix Negative    31. Oak, [place] mix Negative    32. Pecan Pollen Negative    33. Pine mix Negative    34. Sycamore Eastern Negative    35. [place], Black Pollen Negative    36. Alternaria alternata Negative    37. Cladosporium Herbarum Negative    38. Aspergillus mix Negative    39. Penicillium mix Negative    40. Bipolaris sorokiniana (Helminthosporium) Negative    41. Drechslera spicifera (Curvularia) Negative    42. Mucor plumbeus Negative    43. Fusarium moniliforme Negative    44. Aureobasidium pullulans (pullulara) Negative    45. Rhizopus oryzae Negative    46. Botrytis cinera Negative    47. Epicoccum nigrum Negative    48. Phoma betae Negative    49. Candida Albicans Negative    50. Trichophyton mentagrophytes Negative    51. Mite, D Farinae  5,000 AU/ml 3+    52. Mite, D Pteronyssinus  5,000 AU/ml Negative    53. Cat Hair 10,000 BAU/ml Negative    54.  Dog Epithelia Negative    55. Mixed Feathers Negative    56. Horse Epithelia Negative    57. Cockroach, German Negative    58. Mouse Negative    59. Tobacco Leaf Negative             Intradermal - 04/08/22 1057     Time Antigen Placed 1057    Allergen Manufacturer Greer    Location Arm    Number of Test 14    Intradermal Select    Control Negative    Gu[place] Negative    Johnson Negative    7 Grass Negative    Ragweed  mix Negative    Weed mix Negative    Tree mix 4+    Mold 1 Negative    Mold 2 Negative    Mold 3 Negative    Mold 4 Negative    Cat Negative    Dog Negative    Cockroach 4+             Allergy testing results were read and interpreted by myself, documented by clinical staff.         Salvatore Marvel, MD [facility]

## 2022-04-08 NOTE — Patient Instructions (Addendum)
1. Seasonal and perennial allergic rhinitis - Testing today showed: trees, dust mites, and cockroach. - Copy of test results provided.  - Avoidance measures provided. - Continue with: Xyzal (levocetirizine) '5mg'$  tablet once daily - You can use an extra dose of the antihistamine, if needed, for breakthrough symptoms.  - Consider nasal saline rinses 1-2 times daily to remove allergens from the nasal cavities as well as help with mucous clearance (this is especially helpful to do before the nasal sprays are given) -  We will ignore the cockroach since this does not seem to be a relevant allergen. - This will allow [place] to only mix ONE vial so that we can do one shot.  - Make an appointment to start in a couple of weeks.  2. Return in about 3 months (around 07/09/2022).    Please inform us of any Emergency Department visits, hospitalizations, or changes in symptoms. Call us before going to the ED for breathing or allergy symptoms since we might be able to fit you in for a sick visit. Feel free to contact us anytime with any questions, problems, or concerns.  It was a pleasure to meet you today!  Websites that have reliable patient information: 1. American Academy of Asthma, Allergy, and Immunology: www.aaaai.org 2. Food Allergy Research and Education (FARE): foodallergy.org 3. Mothers of Asthmatics: http://www.asthmacommunitynetwork.org 4. American College of Allergy, Asthma, and Immunology: www.acaai.org   COVID-19 Vaccine Information can be found at: [email or web address] For questions related to vaccine distribution or appointments, please email [email or web address] or call [phone].   We realize that you might be concerned about having an allergic reaction to the COVID19 vaccines. To help with that concern, WE ARE OFFERING THE COVID19 VACCINES IN OUR OFFICE! Ask the front desk for dates!     "Like" [place] on Facebook and Instagram  for our latest updates!      A healthy democracy works best when [employer] participate! Make sure you are registered to vote! If you have moved or changed any of your contact information, you will need to get this updated before voting!  In some cases, you MAY be able to register to vote online: [email or web address]      Airborne Adult Perc - 04/08/22 1006     Time Antigen Placed 1006    Allergen Manufacturer Lavella Hammock    Location Back    Number of Test 59    1. Control-Buffer 50% Glycerol Negative    2. Control-Histamine 1 mg/ml 3+    3. Albumin saline Negative    4. Varnado Negative    5. [place] Negative    6. Johnson Negative    7. [place] Blue Negative    8. Meadow Fescue Negative    9. Perennial Rye Negative    10. Sweet Vernal Negative    11. Timothy Negative    12. Cocklebur Negative    13. Burweed Marshelder Negative    14. Ragweed, short Negative    15. Ragweed, Giant Negative    16. Plantain,  English Negative    17. Lamb's Quarters Negative    18. Sheep Sorrell Negative    19. Rough Pigweed Negative    20. Marsh Elder, Rough Negative    21. Mugwort, Common Negative    22. Ash mix Negative    23. Birch mix Negative    24. Beech American Negative    25. Box, Elder Negative    26. Cedar, red Negative    27. Cottonwood, [place] Negative  28. Elm mix Negative    29. Hickory Negative    30. Maple mix Negative    31. Oak, [place] mix Negative    32. Pecan Pollen Negative    33. Pine mix Negative    34. Sycamore Eastern Negative    35. [place], Black Pollen Negative    36. Alternaria alternata Negative    37. Cladosporium Herbarum Negative    38. Aspergillus mix Negative    39. Penicillium mix Negative    40. Bipolaris sorokiniana (Helminthosporium) Negative    41. Drechslera spicifera (Curvularia) Negative    42. Mucor plumbeus Negative    43. Fusarium moniliforme Negative    44. Aureobasidium pullulans (pullulara) Negative     45. Rhizopus oryzae Negative    46. Botrytis cinera Negative    47. Epicoccum nigrum Negative    48. Phoma betae Negative    49. Candida Albicans Negative    50. Trichophyton mentagrophytes Negative    51. Mite, D Farinae  5,000 AU/ml 3+    52. Mite, D Pteronyssinus  5,000 AU/ml Negative    53. Cat Hair 10,000 BAU/ml Negative    54.  Dog Epithelia Negative    55. Mixed Feathers Negative    56. Horse Epithelia Negative    57. Cockroach, German Negative    58. Mouse Negative    59. Tobacco Leaf Negative             Intradermal - 04/08/22 1057     Time Antigen Placed 1057    Allergen Manufacturer Lavella Hammock    Location Arm    Number of Test 14    Intradermal Select    Control Negative    Gu[place] Negative    Johnson Negative    7 Grass Negative    Ragweed mix Negative    Weed mix Negative    Tree mix 4+    Mold 1 Negative    Mold 2 Negative    Mold 3 Negative    Mold 4 Negative    Cat Negative    Dog Negative    Cockroach 4+             Reducing Pollen Exposure  The American Academy of Allergy, Asthma and Immunology suggests the following steps to reduce your exposure to pollen during allergy seasons.    Do not hang sheets or clothing out to dry; pollen may collect on these items. Do not mow lawns or spend time around freshly cut grass; mowing stirs up pollen. Keep windows closed at night.  Keep car windows closed while driving. Minimize morning activities outdoors, a time when pollen counts are usually at their highest. Stay indoors as much as possible when pollen counts or humidity is high and on windy days when pollen tends to remain in the air longer. Use air conditioning when possible.  Many air conditioners have filters that trap the pollen spores. Use a HEPA room air filter to remove pollen form the indoor air you breathe.   Control of Dust Mite Allergen    Dust mites play a major role in allergic asthma and rhinitis.  They occur in environments with  high humidity wherever human skin is found.  Dust mites absorb humidity from the atmosphere (ie, they do not drink) and feed on organic matter (including shed human and animal skin).  Dust mites are a microscopic type of insect that you cannot see with the naked eye.  High levels of dust mites have been detected from  mattresses, pillows, carpets, upholstered furniture, bed covers, clothes, soft toys and any woven material.  The principal allergen of the dust mite is found in its feces.  A gram of dust may contain 1,000 mites and 250,000 fecal particles.  Mite antigen is easily measured in the air during house cleaning activities.  Dust mites do not bite and do not cause harm to humans, other than by triggering allergies/asthma.    Ways to decrease your exposure to dust mites in your home:  Encase mattresses, box springs and pillows with a mite-impermeable barrier or cover   Wash sheets, blankets and drapes weekly in hot water (130 F) with detergent and dry them in a dryer on the hot setting.  Have the room cleaned frequently with a vacuum cleaner and a damp dust-mop.  For carpeting or rugs, vacuuming with a vacuum cleaner equipped with a high-efficiency particulate air (HEPA) filter.  The dust mite allergic individual should not be in a room which is being cleaned and should wait 1 hour after cleaning before going into the room. Do not sleep on upholstered furniture (eg, couches).   If possible removing carpeting, upholstered furniture and drapery from the home is ideal.  Horizontal blinds should be eliminated in the rooms where the person spends the most time (bedroom, study, television room).  Washable vinyl, roller-type shades are optimal. Remove all non-washable stuffed toys from the bedroom.  Wash stuffed toys weekly like sheets and blankets above.   Reduce indoor humidity to less than 50%.  Inexpensive humidity monitors can be purchased at most hardware stores.  Do not use a humidifier as can make  the problem worse and are not recommended.   Control of Cockroach Allergen  Cockroach allergen has been identified as an important cause of acute attacks of asthma, especially in urban settings.  There are fifty-five species of cockroach that exist in the [place], however only three, the [place], [place] species produce allergen that can affect patients with Asthma.  Allergens can be obtained from fecal particles, egg casings and secretions from cockroaches.    Remove food sources. Reduce access to water. Seal access and entry points. Spray runways with 0.5-1% Diazinon or Chlorpyrifos Blow boric acid power under stoves and refrigerator. Place bait stations (hydramethylnon) at feeding sites.  Allergy Shots   Allergies are the result of a chain reaction that starts in the immune system. Your immune system controls how your body defends itself. For instance, if you have an allergy to pollen, your immune system identifies pollen as an invader or allergen. Your immune system overreacts by producing antibodies called Immunoglobulin E (IgE). These antibodies travel to cells that release chemicals, causing an allergic reaction.  The concept behind allergy immunotherapy, whether it is received in the form of shots or tablets, is that the immune system can be desensitized to specific allergens that trigger allergy symptoms. Although it requires time and patience, the payback can be long-term relief.  How Do Allergy Shots Work?  Allergy shots work much like a vaccine. Your body responds to injected amounts of a particular allergen given in increasing doses, eventually developing a resistance and tolerance to it. Allergy shots can lead to decreased, minimal or no allergy symptoms.  There generally are two phases: build-up and maintenance. Build-up often ranges from three to six months and involves receiving injections with increasing amounts of the allergens. The shots are typically  given once or twice a week, though more rapid build-up schedules are sometimes  used.  The maintenance phase begins when the most effective dose is reached. This dose is different for each person, depending on how allergic you are and your response to the build-up injections. Once the maintenance dose is reached, there are longer periods between injections, typically two to four weeks.  Occasionally doctors give cortisone-type shots that can temporarily reduce allergy symptoms. These types of shots are different and should not be confused with allergy immunotherapy shots.  Who Can Be Treated with Allergy Shots?  Allergy shots may be a good treatment approach for people with allergic rhinitis (hay fever), allergic asthma, conjunctivitis (eye allergy) or stinging insect allergy.   Before deciding to begin allergy shots, you should consider:   The length of allergy season and the severity of your symptoms  Whether medications and/or changes to your environment can control your symptoms  Your desire to avoid long-term medication use  Time: allergy immunotherapy requires a major time commitment  Cost: may vary depending on your insurance coverage  Allergy shots for children age 71 and older are effective and often well tolerated. They might prevent the onset of new allergen sensitivities or the progression to asthma.  Allergy shots are not started on patients who are pregnant but can be continued on patients who become pregnant while receiving them. In some patients with other medical conditions or who take certain common medications, allergy shots may be of risk. It is important to mention other medications you talk to your allergist.   When Will I Feel Better?  Some may experience decreased allergy symptoms during the build-up phase. For others, it may take as long as 12 months on the maintenance dose. If there is no improvement after a year of maintenance, your allergist will discuss other  treatment options with you.  If you aren't responding to allergy shots, it may be because there is not enough dose of the allergen in your vaccine or there are missing allergens that were not identified during your allergy testing. Other reasons could be that there are high levels of the allergen in your environment or major exposure to non-allergic triggers like tobacco smoke.  What Is the Length of Treatment?  Once the maintenance dose is reached, allergy shots are generally continued for three to five years. The decision to stop should be discussed with your allergist at that time. Some people may experience a permanent reduction of allergy symptoms. Others may relapse and a longer course of allergy shots can be considered.  What Are the Possible Reactions?  The two types of adverse reactions that can occur with allergy shots are local and systemic. Common local reactions include very mild redness and swelling at the injection site, which can happen immediately or several hours after. A systemic reaction, which is less common, affects the entire body or a particular body system. They are usually mild and typically respond quickly to medications. Signs include increased allergy symptoms such as sneezing, a stuffy nose or hives.  Rarely, a serious systemic reaction called anaphylaxis can develop. Symptoms include swelling in the throat, wheezing, a feeling of tightness in the chest, nausea or dizziness. Most serious systemic reactions develop within 30 minutes of allergy shots. This is why it is strongly recommended you wait in your doctor's office for 30 minutes after your injections. Your allergist is trained to watch for reactions, and his or her staff is trained and equipped with the proper medications to identify and treat them.  Who Should Administer Allergy Shots?  The preferred location for receiving shots is your prescribing allergist's office. Injections can sometimes be given at another  facility where the physician and staff are trained to recognize and treat reactions, and have received instructions by your prescribing allergist.

## 2022-04-09 NOTE — Progress Notes (Signed)
VIALS NOT MADE UNTIL APPT SCHED.

## 2022-04-09 NOTE — Progress Notes (Signed)
Aeroallergen Immunotherapy   Ordering Provider: Dr. Salvatore Marvel   Patient Details  Name: [name] [name]  MRN: [id number]  Date of Birth: March 20, 1961   Order 1 of 1   Vial Label: DM/Trees   0.8 ml (Volume)  1:20 Concentration -- Eastern 10 Tree Mix (also Sweet Gum)  0.8 ml (Volume)   AU Concentration -- Mite Mix (DF 5,000 & DP 5,000)    1.6  ml Extract Subtotal  3.4  ml Diluent  5.0  ml Maintenance Total   Schedule:  A   Red Vial (1:100): Schedule A (10 doses)   Special Instructions: Start with the Red Vial on Schedule A. She has been on shots with the same allergens for years from an outside allergist.

## 2022-04-20 ENCOUNTER — Other Ambulatory Visit: Payer: Self-pay | Admitting: Nurse Practitioner

## 2022-04-20 DIAGNOSIS — Z1231 Encounter for screening mammogram for malignant neoplasm of breast: Secondary | ICD-10-CM

## 2022-04-27 NOTE — Progress Notes (Signed)
Patient has been scheduled for a follow up. Patient is going to call her insurance to see how much they will cover. If she is interested she states she will bring the signed paperwork into the [facility] office and get scheduled.

## 2022-05-23 ENCOUNTER — Other Ambulatory Visit: Payer: Self-pay | Admitting: Nurse Practitioner

## 2022-05-23 DIAGNOSIS — F32A Depression, unspecified: Secondary | ICD-10-CM

## 2022-06-03 NOTE — Progress Notes (Signed)
VIAL EXP 06-04-23

## 2022-06-04 ENCOUNTER — Other Ambulatory Visit: Payer: Self-pay | Admitting: Nurse Practitioner

## 2022-06-04 ENCOUNTER — Ambulatory Visit
Admission: RE | Admit: 2022-06-04 | Discharge: 2022-06-04 | Disposition: A | Discharge status: Home / Self Care | Payer: Medicare HMO | Source: Ambulatory Visit | Attending: Nurse Practitioner | Admitting: Nurse Practitioner

## 2022-06-04 DIAGNOSIS — J3089 Other allergic rhinitis: Secondary | ICD-10-CM

## 2022-06-04 DIAGNOSIS — N63 Unspecified lump in unspecified breast: Secondary | ICD-10-CM

## 2022-06-04 DIAGNOSIS — Z1231 Encounter for screening mammogram for malignant neoplasm of breast: Secondary | ICD-10-CM

## 2022-06-04 DIAGNOSIS — M7989 Other specified soft tissue disorders: Secondary | ICD-10-CM

## 2022-06-09 ENCOUNTER — Other Ambulatory Visit: Payer: Self-pay | Admitting: Family Medicine

## 2022-06-09 DIAGNOSIS — M7989 Other specified soft tissue disorders: Secondary | ICD-10-CM

## 2022-06-10 ENCOUNTER — Ambulatory Visit: Payer: Medicare HMO

## 2022-06-11 DIAGNOSIS — J020 Streptococcal pharyngitis: Secondary | ICD-10-CM | POA: Diagnosis not present

## 2022-06-11 DIAGNOSIS — E663 Overweight: Secondary | ICD-10-CM | POA: Diagnosis not present

## 2022-06-11 DIAGNOSIS — Z6826 Body mass index (BMI) 26.0-26.9, adult: Secondary | ICD-10-CM | POA: Diagnosis not present

## 2022-06-15 ENCOUNTER — Other Ambulatory Visit: Payer: Self-pay | Admitting: Family Medicine

## 2022-06-15 ENCOUNTER — Ambulatory Visit
Admission: RE | Admit: 2022-06-15 | Discharge: 2022-06-15 | Disposition: A | Discharge status: Home / Self Care | Payer: Medicare HMO | Source: Ambulatory Visit | Attending: Nurse Practitioner | Admitting: Nurse Practitioner

## 2022-06-15 DIAGNOSIS — M7989 Other specified soft tissue disorders: Secondary | ICD-10-CM

## 2022-06-15 DIAGNOSIS — N6489 Other specified disorders of breast: Secondary | ICD-10-CM

## 2022-06-15 DIAGNOSIS — R928 Other abnormal and inconclusive findings on diagnostic imaging of breast: Secondary | ICD-10-CM | POA: Diagnosis not present

## 2022-06-19 ENCOUNTER — Ambulatory Visit: Payer: Medicare HMO

## 2022-06-19 ENCOUNTER — Telehealth: Payer: Self-pay

## 2022-06-19 NOTE — Telephone Encounter (Signed)
Patient came in for her new start allergy injections however vials could not be located and patient did not receive an injection today.  After further review patient has been on allergy injections and was to start her allergy injections in her Red vial. Red vial has been located and patient has been rescheduled for 06/24/2022. I apologized to the patient for the incontinence and gave her a target card.

## 2022-06-23 ENCOUNTER — Encounter: Payer: Self-pay | Admitting: Family Medicine

## 2022-06-23 ENCOUNTER — Ambulatory Visit (INDEPENDENT_AMBULATORY_CARE_PROVIDER_SITE_OTHER): Payer: Medicare HMO | Admitting: Family Medicine

## 2022-06-23 VITALS — BP 110/80 | HR 64 | Temp 97.7°F | Wt 142.2 lb

## 2022-06-23 DIAGNOSIS — J029 Acute pharyngitis, unspecified: Secondary | ICD-10-CM | POA: Diagnosis not present

## 2022-06-23 LAB — POCT RAPID STREP A (OFFICE): Rapid Strep A Screen: NEGATIVE

## 2022-06-23 MED ORDER — NAPROXEN 500 MG PO TABS: 500.0000 mg | ORAL_TABLET | Freq: Two times a day (BID) | ORAL | 0 refills | Status: DC | PRN

## 2022-06-23 MED ORDER — FLUTICASONE PROPIONATE 50 MCG/ACT NA SUSP: 2.0000 | Freq: Every day | NASAL | 6 refills | Status: DC

## 2022-06-23 NOTE — Progress Notes (Signed)
Subjective:  Patient ID: [name] [name], female    DOB: [birth year]  Age: 61 y.o. MRN: [id number]  CC: Chief Complaint  Patient presents with   Sore Throat   Ear Pain    Left ear    HPI:  61 year old female presents for evaluation of the above.  Patient recently seen at a local urgent care and was diagnosed with strep throat.  She was treated with amoxicillin.  She states that she has completed her antibiotic course.  She finished this a few days ago.  She states that she is having some left-sided throat pain and is also had some pain to her left ear and to the left side of her head.  No fever.  No reports of ear discharge.  No other reported symptoms.  No other complaints.  Patient Active Problem List   Diagnosis Date Noted   Allergic rhinitis 07/15/2021   Herpes simplex 06/09/2021   NAFLD (nonalcoholic fatty liver disease) 05/14/2021   Psychophysiological insomnia 08/23/2020   Incontinence of feces with fecal urgency 02/14/2020   Hyperlipidemia associated with type 2 diabetes mellitus ([place]) 03/22/2017   Genetic testing 07/01/2016   Diabetes ([place])    IBS (irritable bowel syndrome) 12/10/2015   Depression 08/23/2014   Anxiety 08/23/2014   History of Chiari malformation 08/23/2014   Malignant neoplasm of upper-outer quadrant of left breast in female, estrogen receptor positive ([place]) 04/06/2013   Obesity (BMI 30-39.9) 12/14/2012   GERD (gastroesophageal reflux disease) 12/14/2012   Essential hypertension 09/02/2012   Palpitations     Social Hx   Social History   Socioeconomic History   Marital status: Married    Spouse name: Fritz Pickerel   Number of children: 4   Years of education: Not on file   Highest education level: Not on file  Occupational History   Occupation: Environmental manager work    Fish farm manager: [employer]  Tobacco Use   Smoking status: Former    Packs/day: 0.50    Years: 15.00    Total pack years: 7.50    Types: Cigarettes    Quit date: 07/06/1982    Years  since quitting: 39.9   Smokeless tobacco: Never  Vaping Use   Vaping Use: Never used  Substance and Sexual Activity   Alcohol use: No   Drug use: No   Sexual activity: Yes    Partners: Male    Birth control/protection: Surgical    Comment: TAH  Other Topics Concern   Not on file  Social History Narrative   4 biological children   4 step children   13 grandchildren    1 great grandchildren   2 foster children   Social Determinants of Health   Financial Resource Strain: Low Risk  (07/15/2021)   Overall Financial Resource Strain (CARDIA)    Difficulty of Paying Living Expenses: Not hard at all  Food Insecurity: No Food Insecurity (07/15/2021)   Hunger Vital Sign    Worried About Running Out of Food in the Last Year: Never true    Ran Out of Food in the Last Year: Never true  Transportation Needs: No Transportation Needs (07/15/2021)   PRAPARE - Hydrologist (Medical): No    Lack of Transportation (Non-Medical): No  Physical Activity: Insufficiently Active (07/15/2021)   Exercise Vital Sign    Days of Exercise per Week: 2 days    Minutes of Exercise per Session: 30 min  Stress: No Stress Concern Present (07/15/2021)   [place]  [facility]    Feeling of Stress : Not at all  Social Connections: Socially Integrated (07/15/2021)   Social Connection and Isolation Panel [NHANES]    Frequency of Communication with Friends and Family: More than three times a week    Frequency of Social Gatherings with Friends and Family: More than three times a week    Attends Religious Services: More than 4 times per year    Active Member of [employer] or Organizations: Yes    Attends Music therapist: More than 4 times per year    Marital Status: Married    Review of Systems Per HPI  Objective:  BP 110/80   Pulse 64   Temp 97.7 F (36.5 C) (Oral)   Wt 142 lb 3.2 oz (64.5 kg)   LMP 12/04/2008   SpO2  98%   BMI 27.31 kg/m      06/23/2022    2:16 PM 04/08/2022    9:33 AM 03/25/2022   10:43 AM  BP/Weight  Systolic BP 710 626 948  Diastolic BP 80 70 69  Wt. (Lbs) 142.2 148.5 150.4  BMI 27.31 kg/m2 28.52 kg/m2 28.42 kg/m2    Physical Exam Constitutional:      General: She is not in acute distress.    Appearance: Normal appearance.  HENT:     Head: Normocephalic and atraumatic.     Ears:     Comments: Partial cerumen impactions bilaterally.  No appreciable otitis media on visualized portion of TM.    Mouth/Throat:     Pharynx: No oropharyngeal exudate or posterior oropharyngeal erythema.  Cardiovascular:     Rate and Rhythm: Normal rate and regular rhythm.  Pulmonary:     Effort: Pulmonary effort is normal. No respiratory distress.  Musculoskeletal:     Cervical back: Neck supple. No tenderness.  Neurological:     Mental Status: She is alert.     Lab Results  Component Value Date   WBC 8.5 03/05/2022   HGB 12.9 03/05/2022   HCT 39.9 03/05/2022   PLT 223 03/05/2022   GLUCOSE 80 03/05/2022   CHOL 195 03/05/2022   TRIG 76 03/05/2022   HDL 70 03/05/2022   LDLCALC 111 (H) 03/05/2022   ALT 24 03/05/2022   AST 22 03/05/2022   NA 143 03/05/2022   K 4.1 03/05/2022   CL 103 03/05/2022   CREATININE 1.10 (H) 03/05/2022   BUN 11 03/05/2022   CO2 27 03/05/2022   TSH 1.990 05/08/2020   INR 1.0 06/06/2008   HGBA1C 5.9 (H) 11/25/2021     Assessment & Plan:   Problem List Items Addressed This Visit   None Visit Diagnoses     Sore throat    -  Primary   Relevant Orders   POCT rapid strep A (Completed)   Culture, Group A Strep     Patient with recent pharyngitis.  Rapid strep negative here today.  Sending culture.  Patient likely experiencing postnasal drip and eustachian tube dysfunction.  She also has a history of headaches.  Flonase and naproxen as directed.  Supportive care.  If continues to persist will refer to ENT.  Meds ordered this encounter  Medications    fluticasone (FLONASE) 50 MCG/ACT nasal spray    Sig: Place 2 sprays into both nostrils daily.    Dispense:  16 g    Refill:  6   naproxen (NAPROSYN) 500 MG tablet    Sig: Take 1 tablet (500  mg total) by mouth 2 (two) times daily as needed for moderate pain, mild pain or headache.    Dispense:  30 tablet    Refill:  [place]

## 2022-06-23 NOTE — Patient Instructions (Signed)
Medications as prescribed.  If this continues to persist, please let me know.  Take care  Dr. Lacinda Axon

## 2022-06-24 ENCOUNTER — Ambulatory Visit (INDEPENDENT_AMBULATORY_CARE_PROVIDER_SITE_OTHER): Payer: Medicare HMO

## 2022-06-24 DIAGNOSIS — J309 Allergic rhinitis, unspecified: Secondary | ICD-10-CM

## 2022-06-24 NOTE — Progress Notes (Signed)
Immunotherapy   Patient Details  Name: [name] [name] MRN: [id number] Date of Birth: 06-Mar-1961  06/24/2022  [name] started injections for DM-Tree. Patient received 0.05 of her Red vial with an expiration of 06/04/2023. Patient waited 30 minutes with no problems.  Following schedule: A  Frequency:1 time per week Epi-Pen:Epi-Pen Available  Consent signed and patient instructions given.   Herbie Drape 06/24/2022, 10:04 AM

## 2022-06-26 LAB — CULTURE, GROUP A STREP: Strep A Culture: NEGATIVE

## 2022-07-03 ENCOUNTER — Ambulatory Visit (INDEPENDENT_AMBULATORY_CARE_PROVIDER_SITE_OTHER): Payer: Medicare HMO

## 2022-07-03 DIAGNOSIS — J309 Allergic rhinitis, unspecified: Secondary | ICD-10-CM

## 2022-07-08 ENCOUNTER — Telehealth: Payer: Self-pay | Admitting: Family Medicine

## 2022-07-08 DIAGNOSIS — E119 Type 2 diabetes mellitus without complications: Secondary | ICD-10-CM

## 2022-07-08 NOTE — Telephone Encounter (Signed)
Refill request from [facility] for Ozempic 0.25-05 mg/dose pen. Pt last seen 06/23/22 for Sore throat; seen Leonna on 920/23 for hematuria. Please advise. Has upcoming appt on 07/27/22 Thank you

## 2022-07-09 MED ORDER — OZEMPIC (0.25 OR 0.5 MG/DOSE) 2 MG/3ML ~~LOC~~ SOPN: 0.5000 mg | PEN_INJECTOR | SUBCUTANEOUS | 2 refills | Status: DC

## 2022-07-09 NOTE — Addendum Note (Signed)
Addended by: Vicente Males on: 07/09/2022 03:18 PM   Modules accepted: Orders

## 2022-07-09 NOTE — Telephone Encounter (Signed)
Refill sent to pharmacy.   

## 2022-07-10 ENCOUNTER — Ambulatory Visit (INDEPENDENT_AMBULATORY_CARE_PROVIDER_SITE_OTHER): Payer: Self-pay

## 2022-07-10 DIAGNOSIS — J309 Allergic rhinitis, unspecified: Secondary | ICD-10-CM

## 2022-07-17 ENCOUNTER — Ambulatory Visit (INDEPENDENT_AMBULATORY_CARE_PROVIDER_SITE_OTHER): Payer: HMO

## 2022-07-17 DIAGNOSIS — J309 Allergic rhinitis, unspecified: Secondary | ICD-10-CM | POA: Diagnosis not present

## 2022-07-24 ENCOUNTER — Ambulatory Visit (INDEPENDENT_AMBULATORY_CARE_PROVIDER_SITE_OTHER): Payer: PPO

## 2022-07-24 VITALS — Ht 61.0 in | Wt 142.0 lb

## 2022-07-24 DIAGNOSIS — J309 Allergic rhinitis, unspecified: Secondary | ICD-10-CM | POA: Diagnosis not present

## 2022-07-24 DIAGNOSIS — Z0000 Encounter for general adult medical examination without abnormal findings: Secondary | ICD-10-CM | POA: Diagnosis not present

## 2022-07-24 NOTE — Patient Instructions (Signed)
[name] [name] , Thank you for taking time to come for your Medicare Wellness Visit. I appreciate your ongoing commitment to your health goals. Please review the following plan we discussed and let me know if I can assist you in the future.   These are the goals we discussed:  Goals      Exercise 3x per week (30 min per time)     Increase exercise as tolerated.         This is a list of the screening recommended for you and due dates:  Health Maintenance  Topic Date Due   DTaP/Tdap/Td vaccine (1 - Tdap) Never done   Zoster (Shingles) Vaccine (1 of 2) Never done   Eye exam for diabetics  12/26/2020   Flu Shot  02/03/2022   COVID-19 Vaccine (5 - 2023-24 season) 03/06/2022   Hemoglobin A1C  05/28/2022   Medicare Annual Wellness Visit  07/15/2022   Complete foot exam   11/25/2022   Yearly kidney health urinalysis for diabetes  11/26/2022   Yearly kidney function blood test for diabetes  03/06/2023   Mammogram  06/15/2024   Colon Cancer Screening  01/05/2028   Hepatitis C Screening: USPSTF Recommendation to screen - Ages 18-79 yo.  Completed   HIV Screening  Completed   HPV Vaccine  Aged Out    Advanced directives: Advance directive discussed with you today. I have provided a copy for you to complete at home and have notarized. Once this is complete please bring a copy in to our office so we can scan it into your chart.   Conditions/risks identified: Aim for 30 minutes of exercise or brisk walking, 6-8 glasses of water, and 5 servings of fruits and vegetables each day.   Next appointment: Follow up in one year for your annual wellness visit.   Preventive Care 40-64 Years, Female Preventive care refers to lifestyle choices and visits with your health care provider that can promote health and wellness. What does preventive care include? A yearly physical exam. This is also called an annual well check. Dental exams once or twice a year. Routine eye exams. Ask your health care provider  how often you should have your eyes checked. Personal lifestyle choices, including: Daily care of your teeth and gums. Regular physical activity. Eating a healthy diet. Avoiding tobacco and drug use. Limiting alcohol use. Practicing safe sex. Taking low-dose aspirin daily starting at age 53. Taking vitamin and mineral supplements as recommended by your health care provider. What happens during an annual well check? The services and screenings done by your health care provider during your annual well check will depend on your age, overall health, lifestyle risk factors, and family history of disease. Counseling  Your health care provider may ask you questions about your: Alcohol use. Tobacco use. Drug use. Emotional well-being. Home and relationship well-being. Sexual activity. Eating habits. Work and work Statistician. Method of birth control. Menstrual cycle. Pregnancy history. Screening  You may have the following tests or measurements: Height, weight, and BMI. Blood pressure. Lipid and cholesterol levels. These may be checked every 5 years, or more frequently if you are over 39 years old. Skin check. Lung cancer screening. You may have this screening every year starting at age 85 if you have a 30-pack-year history of smoking and currently smoke or have quit within the past 15 years. Fecal occult blood test (FOBT) of the stool. You may have this test every year starting at age 71. Flexible sigmoidoscopy or colonoscopy.  You may have a sigmoidoscopy every 5 years or a colonoscopy every 10 years starting at age 80. Hepatitis C blood test. Hepatitis B blood test. Sexually transmitted disease (STD) testing. Diabetes screening. This is done by checking your blood sugar (glucose) after you have not eaten for a while (fasting). You may have this done every 1-3 years. Mammogram. This may be done every 1-2 years. Talk to your health care provider about when you should start having  regular mammograms. This may depend on whether you have a family history of breast cancer. BRCA-related cancer screening. This may be done if you have a family history of breast, ovarian, tubal, or peritoneal cancers. Pelvic exam and Pap test. This may be done every 3 years starting at age 9. Starting at age 49, this may be done every 5 years if you have a Pap test in combination with an HPV test. Bone density scan. This is done to screen for osteoporosis. You may have this scan if you are at high risk for osteoporosis. Discuss your test results, treatment options, and if necessary, the need for more tests with your health care provider. Vaccines  Your health care provider may recommend certain vaccines, such as: Influenza vaccine. This is recommended every year. Tetanus, diphtheria, and acellular pertussis (Tdap, Td) vaccine. You may need a Td booster every 10 years. Zoster vaccine. You may need this after age 28. Pneumococcal 13-valent conjugate (PCV13) vaccine. You may need this if you have certain conditions and were not previously vaccinated. Pneumococcal polysaccharide (PPSV23) vaccine. You may need one or two doses if you smoke cigarettes or if you have certain conditions. Talk to your health care provider about which screenings and vaccines you need and how often you need them. This information is not intended to replace advice given to you by your health care provider. Make sure you discuss any questions you have with your health care provider. Document Released: 07/19/2015 Document Revised: 03/11/2016 Document Reviewed: 04/23/2015 Elsevier Interactive Patient Education  2017 [place] Prevention in the Home Falls can cause injuries. They can happen to people of all ages. There are many things you can do to make your home safe and to help prevent falls. What can I do on the outside of my home? Regularly fix the edges of walkways and driveways and fix any cracks. Remove  anything that might make you trip as you walk through a door, such as a raised step or threshold. Trim any bushes or trees on the path to your home. Use bright outdoor lighting. Clear any walking paths of anything that might make someone trip, such as rocks or tools. Regularly check to see if handrails are loose or broken. Make sure that both sides of any steps have handrails. Any raised decks and porches should have guardrails on the edges. Have any leaves, snow, or ice cleared regularly. Use sand or salt on walking paths during winter. Clean up any spills in your garage right away. This includes oil or grease spills. What can I do in the bathroom? Use night lights. Install grab bars by the toilet and in the tub and shower. Do not use towel bars as grab bars. Use non-skid mats or decals in the tub or shower. If you need to sit down in the shower, use a plastic, non-slip stool. Keep the floor dry. Clean up any water that spills on the floor as soon as it happens. Remove soap buildup in the tub or shower  regularly. Attach bath mats securely with double-sided non-slip rug tape. Do not have throw rugs and other things on the floor that can make you trip. What can I do in the bedroom? Use night lights. Make sure that you have a light by your bed that is easy to reach. Do not use any sheets or blankets that are too big for your bed. They should not hang down onto the floor. Have a firm chair that has side arms. You can use this for support while you get dressed. Do not have throw rugs and other things on the floor that can make you trip. What can I do in the kitchen? Clean up any spills right away. Avoid walking on wet floors. Keep items that you use a lot in easy-to-reach places. If you need to reach something above you, use a strong step stool that has a grab bar. Keep electrical cords out of the way. Do not use floor polish or wax that makes floors slippery. If you must use wax, use  non-skid floor wax. Do not have throw rugs and other things on the floor that can make you trip. What can I do with my stairs? Do not leave any items on the stairs. Make sure that there are handrails on both sides of the stairs and use them. Fix handrails that are broken or loose. Make sure that handrails are as long as the stairways. Check any carpeting to make sure that it is firmly attached to the stairs. Fix any carpet that is loose or worn. Avoid having throw rugs at the top or bottom of the stairs. If you do have throw rugs, attach them to the floor with carpet tape. Make sure that you have a light switch at the top of the stairs and the bottom of the stairs. If you do not have them, ask someone to add them for you. What else can I do to help prevent falls? Wear shoes that: Do not have high heels. Have rubber bottoms. Are comfortable and fit you well. Are closed at the toe. Do not wear sandals. If you use a stepladder: Make sure that it is fully opened. Do not climb a closed stepladder. Make sure that both sides of the stepladder are locked into place. Ask someone to hold it for you, if possible. Clearly mark and make sure that you can see: Any grab bars or handrails. First and last steps. Where the edge of each step is. Use tools that help you move around (mobility aids) if they are needed. These include: Canes. Walkers. Scooters. Crutches. Turn on the lights when you go into a dark area. Replace any light bulbs as soon as they burn out. Set up your furniture so you have a clear path. Avoid moving your furniture around. If any of your floors are uneven, fix them. If there are any pets around you, be aware of where they are. Review your medicines with your doctor. Some medicines can make you feel dizzy. This can increase your chance of falling. Ask your doctor what other things that you can do to help prevent falls. This information is not intended to replace advice given to  you by your health care provider. Make sure you discuss any questions you have with your health care provider. Document Released: 04/18/2009 Document Revised: 11/28/2015 Document Reviewed: 07/27/2014 Elsevier Interactive Patient Education  2017 [employer].

## 2022-07-24 NOTE — Progress Notes (Signed)
Subjective:   [name] [name] is a 62 y.o. female who presents for Medicare Annual (Subsequent) preventive examination.  I connected with  [name] on 07/24/22 by a audio enabled telemedicine application and verified that I am speaking with the correct person using two identifiers.  Patient Location: Home  Provider Location: Office/Clinic  I discussed the limitations of evaluation and management by telemedicine. The patient expressed understanding and agreed to proceed.  Review of Systems     Cardiac Risk Factors include: diabetes mellitus;dyslipidemia;hypertension     Objective:    Today's Vitals   07/24/22 1513  Weight: 142 lb (64.4 kg)  Height: '5\' 1"'$  (1.549 m)   Body mass index is 26.83 kg/m.     07/24/2022    3:20 PM 07/15/2021    3:26 PM 01/14/2021    7:50 AM 12/16/2020    9:38 AM 01/23/2020   11:29 AM 09/04/2019    9:56 AM 08/28/2019    3:03 PM  Advanced Directives  Does Patient Have a Medical Advance Directive? No No No No No No No  Would patient like information on creating a medical advance directive? Yes (MAU/Ambulatory/Procedural Areas - Information given) No - Patient declined No - Patient declined   No - Patient declined No - Patient declined    Current Medications (verified) Outpatient Encounter Medications as of 07/24/2022  Medication Sig   atenolol (TENORMIN) 50 MG tablet Take 1 tablet (50 mg total) by mouth daily.   blood glucose meter kit and supplies KIT Dispense based on patient and insurance preference. Use up to four times daily as directed.   blood glucose meter kit and supplies Dispense based on patient and insurance preference. Use up to test glucose once daily. For ICD -10 E11.9.   diphenhydramine-acetaminophen (TYLENOL PM) 25-500 MG TABS tablet Take 1 tablet by mouth at bedtime as needed (sleep).   EPINEPHrine 0.3 mg/0.3 mL IJ SOAJ injection Inject 0.3 mg into the muscle as needed for anaphylaxis.    escitalopram (LEXAPRO) 20 MG tablet  TAKE 1 TABLET BY MOUTH EVERY DAY   famotidine (PEPCID) 40 MG tablet TAKE 1 TABLET BY MOUTH EVERYDAY AT BEDTIME   fluticasone (FLONASE) 50 MCG/ACT nasal spray Place 2 sprays into both nostrils daily.   glucose blood (ONETOUCH ULTRA) test strip USE TO CHECK BLOOD SUGAR THREE TIMES A DAY   Insulin Pen Needle (PEN NEEDLES 5/16") 30G X 8 MM MISC Use with victoza   Lancets (ONETOUCH DELICA PLUS [id number]) MISC USE TO TEST BLOOD GLUCOSE DAILY   levocetirizine (XYZAL) 5 MG tablet Take 5 mg by mouth every evening.   LORazepam (ATIVAN) 0.5 MG tablet Take 1 tablet (0.5 mg total) by mouth at bedtime as needed for sleep.   Melatonin 10 MG TABS Take 10 mg by mouth at bedtime.   methocarbamol (ROBAXIN) 500 MG tablet Take 500 mg by mouth daily as needed for muscle spasms.   naproxen (NAPROSYN) 500 MG tablet Take 1 tablet (500 mg total) by mouth 2 (two) times daily as needed for moderate pain, mild pain or headache.   ondansetron (ZOFRAN-ODT) 4 MG disintegrating tablet Take 1 tablet (4 mg total) by mouth every 8 (eight) hours as needed for nausea or vomiting.   pantoprazole (PROTONIX) 40 MG tablet TAKE 1 TABLET (40 MG TOTAL) BY MOUTH 2 (TWO) TIMES DAILY BEFORE A MEAL.   rosuvastatin (CRESTOR) 20 MG tablet Take 1 tablet (20 mg total) by mouth daily.   Semaglutide,0.25 or 0.'5MG'$ /DOS, (OZEMPIC, 0.25 OR  0.5 MG/DOSE,) 2 MG/3ML SOPN Inject 0.5 mg into the skin once a week.   valACYclovir (VALTREX) 500 MG tablet Take 1 tablet (500 mg total) by mouth daily.   [DISCONTINUED] potassium chloride SA (K-DUR,KLOR-CON) 20 MEQ tablet Take 1 tablet (20 mEq total) by mouth 2 (two) times daily.   No facility-administered encounter medications on file as of 07/24/2022.    Allergies (verified) Doxycycline   History: Past Medical History:  Diagnosis Date   Allergic rhinitis    Allergy    Anemia, unspecified 05/30/2013   Anxiety    Breast cancer ([place]) 03/31/2013   left   Cancer ([place])    Chest pain 2009    Consultation-[facility], negative chest CT; nl echo in 2005; h/o palpitations   Chiari malformation type I ([place])    s/p suboccipital craniectomy, C1 laminectomy, superior C2 laminectomy, duraplasty 05/10/14   Colitis 2010   not IBD   Degenerative joint disease    + degenerative joint disease of the lumbosacral spine   Depression    Diabetes ([place])    Diabetes ([place])    Dysrhythmia    palpations   Genetic testing 07/01/2016   GERD (gastroesophageal reflux disease)    "a little"   Headache    Heart murmur    "small"   Herpes simplex type II infection    Hypercholesteremia    "slightly high"   Hypertension    does not have high blood pressure   Meningitis 08/22/2014   Palpitations    Personal history of chemotherapy    Personal history of radiation therapy    Urticaria    Wears glasses    Past Surgical History:  Procedure Laterality Date   ABDOMINAL HYSTERECTOMY  03/13/2007   TAH ?BSO--Dr Levin Bacon, [place]   BIOPSY  01/14/2021   Procedure: BIOPSY;  Surgeon: Eloise Harman, DO;  [facility];  Service: Endoscopy;;   BRAIN SURGERY     BREAST EXCISIONAL BIOPSY  04/2003   Left; benign disease   BREAST LUMPECTOMY Left 09/2013   BREAST LUMPECTOMY WITH NEEDLE LOCALIZATION AND AXILLARY LYMPH NODE DISSECTION Left 09/12/2013   Procedure: BREAST LUMPECTOMY WITH NEEDLE LOCALIZATION AND AXILLARY LYMPH NODE DISSECTION;  Surgeon: [name], MD;  Location: [place];  Service: General;  Laterality: Left;  and axilla   BREAST SURGERY     COLONOSCOPY  10/2010   proctitis; melanosis coli   COLONOSCOPY WITH PROPOFOL N/A 01/04/2018   Dr. [name]: hemorrhoids, follow up colonoscopy in 10 years   EAR CYST EXCISION Right 09/04/2019   Procedure: EXCISION EAR MASS;  Surgeon: [name], MD;  Location: [place];  Service: ENT;  Laterality: Right;   ESOPHAGOGASTRODUODENOSCOPY (EGD) WITH PROPOFOL N/A 01/14/2021   Procedure: ESOPHAGOGASTRODUODENOSCOPY  (EGD) WITH PROPOFOL;  Surgeon: Eloise Harman, DO;  [facility];  Service: Endoscopy;  Laterality: N/A;  9:00am   EXCISION/RELEASE BURSA HIP Left 03/17/2019   Procedure: Left hip open bursectomy;  Surgeon: Nicholes Stairs, MD;  Location: [facility];  Service: Orthopedics;  Laterality: Left;  75 mins   EYE SURGERY Left    "fix lazy eye"   PORTACATH PLACEMENT Right 04/21/2013   Procedure: INSERTION PORT-A-CATH;  Surgeon: [name], MD;  Location: [facility];  Service: General;  Laterality: Right;   RIGHT OOPHORECTOMY  03/13/2007   SUBOCCIPITAL CRANIECTOMY CERVICAL LAMINECTOMY N/A 05/10/2014   Procedure: SUBOCCIPITAL CRANIECTOMY CERVICAL LAMINECTOMY/DURAPLASTY;  Surgeon: [name], MD;  [facility];  Service: Neurosurgery;  Laterality: N/A;  suboccipital craniectomy with cervical laminectomy and duraplasty   TUBAL LIGATION     Family History  Problem Relation Age of Onset   Aneurysm Mother        Cerebral   Hypertension Mother    Hyperlipidemia Mother    Stroke Mother    Depression Mother    Coronary artery disease Father    Hypertension Father    Hyperlipidemia Father    Heart attack Father        d. 46   Lung cancer Father        dx early 85s; former smoker   Lung cancer Sister        paternal half-sister; not a smoker; dx <60; d. 52y   Breast cancer Sister 70   Other Sister        hx of hysterectomy for unspecified reason   Eczema Brother    Lung cancer Brother    Lung cancer Brother        d. 31y; smoker   Multiple myeloma Brother        d. 30s   Lung cancer Brother        dx late 27s; smoker   Arthritis Brother    Prostate cancer Brother        dx late 12s   Prostate cancer Brother        dx late 9s   Lung cancer Brother    Diabetes Brother    Leukemia Maternal Aunt        "blood cancer"; d. unspecified age   Lung cancer Maternal Aunt        d. unspecified age   Breast cancer Maternal Aunt        dx unspecified age   Cervical  cancer Paternal Aunt        d. late 30s   Anxiety disorder Daughter    Prostate cancer Cousin        maternal 1st cousin; dx older age   Breast cancer Other        niece dx early 68s or before   Leukemia Other        nephew d. 2y; "blood cancer"   Colon cancer Neg Hx    Social History   Socioeconomic History   Marital status: Married    Spouse name: Fritz Pickerel   Number of children: 4   Years of education: Not on file   Highest education level: Not on file  Occupational History   Occupation: [place] work    Fish farm manager: Engineer, [employer]  Tobacco Use   Smoking status: Former    Packs/day: 0.50    Years: 15.00    Total pack years: 7.50    Types: Cigarettes    Quit date: 07/06/1982    Years since quitting: 40.0   Smokeless tobacco: Never  Vaping Use   Vaping Use: Never used  Substance and Sexual Activity   Alcohol use: No   Drug use: No   Sexual activity: Yes    Partners: Male    Birth control/protection: Surgical    Comment: TAH  Other Topics Concern   Not on file  Social History Narrative   4 biological children   4 step children   13 grandchildren    1 great grandchildren   2 foster children   Social Determinants of Health   Financial Resource Strain: Low Risk  (07/24/2022)   Overall Financial Resource Strain (CARDIA)    Difficulty of  Paying Living Expenses: Not hard at all  Food Insecurity: Food Insecurity Present (07/24/2022)   Hunger Vital Sign    Worried About Running Out of Food in the Last Year: Sometimes true    Ran Out of Food in the Last Year: Sometimes true  Transportation Needs: No Transportation Needs (07/24/2022)   PRAPARE - Hydrologist (Medical): No    Lack of Transportation (Non-Medical): No  Physical Activity: Inactive (07/24/2022)   Exercise Vital Sign    Days of Exercise per Week: 0 days    Minutes of Exercise per Session: 0 min  Stress: No Stress Concern Present (07/24/2022)   [place]    Feeling of Stress : Only a little  Social Connections: Socially Integrated (07/24/2022)   Social Connection and Isolation Panel [NHANES]    Frequency of Communication with Friends and Family: More than three times a week    Frequency of Social Gatherings with Friends and Family: Once a week    Attends Religious Services: More than 4 times per year    Active Member of [employer] or Organizations: Yes    Attends Archivist Meetings: Never    Marital Status: Married    Tobacco Counseling Counseling given: Not Answered   Clinical Intake:  Pre-visit preparation completed: Yes  Pain : No/denies pain  Diabetes: Yes CBG done?: No Did pt. bring in CBG monitor from home?: No  How often do you need to have someone help you when you read instructions, pamphlets, or other written materials from your doctor or pharmacy?: 1 - Never  Diabetic?Yes Nutrition Risk Assessment:  Has the patient had any N/V/D within the last 2 months?  No  Does the patient have any non-healing wounds?  No  Has the patient had any unintentional weight loss or weight gain?  No   Diabetes:  Is the patient diabetic?  Yes  If diabetic, was a CBG obtained today?  No  Did the patient bring in their glucometer from home?  No  How often do you monitor your CBG's? Daily .   Financial Strains and Diabetes Management:  Are you having any financial strains with the device, your supplies or your medication? No .  Does the patient want to be seen by Chronic Care Management for management of their diabetes?  No  Would the patient like to be referred to a Nutritionist or for Diabetic Management?  No   Diabetic Exams:  Diabetic Eye Exam: Completed will request notes [facility] Dr. Debe Coder  Diabetic Foot Exam: Completed 11/24/21   Interpreter Needed?: No  Information entered by :: Denman George LPN   Activities of Daily Living    07/24/2022    3:20 PM 07/23/2022    4:34  PM  In your present state of health, do you have any difficulty performing the following activities:  Hearing? 0 0  Vision? 0 0  Difficulty concentrating or making decisions? 0 0  Walking or climbing stairs? 0 0  Dressing or bathing? 0 0  Doing errands, shopping? 0 0  Preparing Food and eating ? N N  Using the Toilet? N N  In the past six months, have you accidently leaked urine? N N  Do you have problems with loss of bowel control? N N  Managing your Medications? N N  Managing your Finances? N N  Housekeeping or managing your Housekeeping? N N    Patient Care Team: [place],  Barnie Del, DO as PCP - General (Family Medicine) Nicholes Stairs, MD as Consulting Physician (Orthopedic Surgery) Nilda Simmer, NP (Family Medicine) Eloise Harman, DO as Consulting Physician (Gastroenterology) Delice Bison Charlestine Massed, NP as Nurse Practitioner (Hematology and Oncology) Celestia Khat, [place] (Optometry)  Indicate any recent Medical Services you may have received from other than [facility] providers in the past year (date may be approximate).     Assessment:   This is a routine wellness examination for Kammie.  Hearing/Vision screen Hearing Screening - Comments:: Denies hearing difficulties  Vision Screening - Comments:: Wears rx glasses - up to date with routine eye exams with [facility] Dr. Debe Coder     Dietary issues and exercise activities discussed: Current Exercise Habits: The patient does not participate in regular exercise at present   Goals Addressed   None    Depression Screen    07/24/2022    3:18 PM 06/23/2022    2:23 PM 07/15/2021    3:24 PM 12/23/2020    3:06 PM 10/03/2020    8:18 AM 08/23/2020   12:42 PM 08/01/2020    8:43 AM  PHQ 2/9 Scores  PHQ - 2 Score 1 1 0 0 0 2 1  PHQ- 9 Score '4 4    9 3    '$ Fall Risk    07/24/2022    3:16 PM 07/23/2022    4:34 PM 06/23/2022    2:23 PM 07/15/2021    3:27 PM 12/06/2020    2:40 PM  Fall Risk   Falls in the past year? 0 0 0 0 0   Number falls in past yr: 0  0 0 0  Injury with Fall? 0 0 0 0 0  Risk for fall due to : No Fall Risks   No Fall Risks No Fall Risks  Follow up Falls evaluation completed;Education provided;Falls prevention discussed   Falls prevention discussed Falls evaluation completed    FALL RISK PREVENTION PERTAINING TO THE HOME:  Any stairs in or around the home? No  If so, are there any without handrails? No  Home free of loose throw rugs in walkways, pet beds, electrical cords, etc? Yes  Adequate lighting in your home to reduce risk of falls? Yes   ASSISTIVE DEVICES UTILIZED TO PREVENT FALLS:  Life alert? No  Use of a cane, walker or w/c? No  Grab bars in the bathroom? Yes  Shower chair or bench in shower? No  Elevated toilet seat or a handicapped toilet? Yes   TIMED UP AND GO:  Was the test performed? No . Telephonic visit   Cognitive Function:        07/24/2022    3:20 PM 07/15/2021    3:28 PM  6CIT Screen  What Year? 0 points 0 points  What month? 0 points 0 points  What time? 0 points 0 points  Count back from 20 0 points 0 points  Months in reverse 0 points 0 points  Repeat phrase 0 points 0 points  Total Score 0 points 0 points    Immunizations Immunization History  Administered Date(s) Administered   Influenza, Quadrivalent, Recombinant, Inj, Pf 05/02/2019   Influenza,inj,Quad PF,6+ Mos 05/20/2018   Influenza-Unspecified 07/06/1997, 04/05/2017, 04/11/2021, 04/27/2022   Moderna SARS-COV2 Booster Vaccination 05/23/2020, 04/11/2021   Moderna Sars-Covid-2 Vaccination 09/28/2019, 10/12/2019, 11/23/2019, 04/12/2020   Pneumococcal Polysaccharide-23 10/18/2017    TDAP status: Due, Education has been provided regarding the importance of this vaccine. Advised may receive this vaccine at local pharmacy or  Health Dept. Aware to provide a copy of the vaccination record if obtained from local pharmacy or Health Dept. Verbalized acceptance and understanding.  Flu Vaccine status:  Up to date  Pneumococcal vaccine status: Up to date  Covid-19 vaccine status: Information provided on how to obtain vaccines.   Qualifies for Shingles Vaccine? Yes   Zostavax completed No   Shingrix Completed?: No.    Education has been provided regarding the importance of this vaccine. Patient has been advised to call insurance company to determine out of pocket expense if they have not yet received this vaccine. Advised may also receive vaccine at local pharmacy or Health Dept. Verbalized acceptance and understanding.  Screening Tests Health Maintenance  Topic Date Due   DTaP/Tdap/Td (1 - Tdap) Never done   OPHTHALMOLOGY EXAM  12/26/2020   HEMOGLOBIN A1C  05/28/2022   COVID-19 Vaccine (5 - 2023-24 season) 08/09/2022 (Originally 03/06/2022)   Zoster Vaccines- Shingrix (1 of 2) 01/03/2023 (Originally [birth year]1980)   FOOT EXAM  11/25/2022   Diabetic kidney evaluation - Urine ACR  11/26/2022   Diabetic kidney evaluation - eGFR measurement  03/06/2023   Medicare Annual Wellness (AWV)  07/25/2023   MAMMOGRAM  06/15/2024   COLONOSCOPY (Pts 45-42yr Insurance coverage will need to be confirmed)  01/05/2028   INFLUENZA VACCINE  Completed   Hepatitis C Screening  Completed   HIV Screening  Completed   HPV VACCINES  Aged Out    Health Maintenance  Health Maintenance Due  Topic Date Due   DTaP/Tdap/Td (1 - Tdap) Never done   OPHTHALMOLOGY EXAM  12/26/2020   HEMOGLOBIN A1C  05/28/2022    Colorectal cancer screening: Type of screening: Colonoscopy. Completed 01/04/18. Repeat every 10 years  Mammogram status: Completed 06/15/22. Repeat every year  Lung Cancer Screening: (Low Dose CT Chest recommended if Age 57-80years, 30 pack-year currently smoking OR have quit w/in 15years.) does not qualify.   Lung Cancer Screening Referral: n/a   Additional Screening:  Hepatitis C Screening: does qualify; Completed 11/25/21  Vision Screening: Recommended annual ophthalmology exams for early  detection of glaucoma and other disorders of the eye. Is the patient up to date with their annual eye exam?  Yes  Who is the provider or what is the name of the office in which the patient attends annual eye exams? [facility] Dr. MToniann Ket If pt is not established with a provider, would they like to be referred to a provider to establish care? No .   Dental Screening: Recommended annual dental exams for proper oral hygiene  Community Resource Referral / Chronic Care Management: CRR required this visit?  No   CCM required this visit?  No      Plan:     I have personally reviewed and noted the following in the patient's chart:   Medical and social history Use of alcohol, tobacco or illicit drugs  Current medications and supplements including opioid prescriptions. Patient is not currently taking opioid prescriptions. Functional ability and status Nutritional status Physical activity Advanced directives List of other physicians Hospitalizations, surgeries, and ER visits in previous 12 months Vitals Screenings to include cognitive, depression, and falls Referrals and appointments  In addition, I have reviewed and discussed with patient certain preventive protocols, quality metrics, and best practice recommendations. A written personalized care plan for preventive services as well as general preventive health recommendations were provided to patient.     SVanetta Mulders [place]  18/14/4818  Due to this being a  virtual visit, the after visit summary with patients personalized plan was offered to patient via mail or my-chart. Patient preferred to pick up at office at next visit   Nurse Notes: No concerns

## 2022-07-27 ENCOUNTER — Encounter: Payer: Self-pay | Admitting: Family Medicine

## 2022-07-27 ENCOUNTER — Ambulatory Visit (INDEPENDENT_AMBULATORY_CARE_PROVIDER_SITE_OTHER): Payer: PPO | Admitting: Family Medicine

## 2022-07-27 DIAGNOSIS — F419 Anxiety disorder, unspecified: Secondary | ICD-10-CM

## 2022-07-27 DIAGNOSIS — E1169 Type 2 diabetes mellitus with other specified complication: Secondary | ICD-10-CM

## 2022-07-27 DIAGNOSIS — I10 Essential (primary) hypertension: Secondary | ICD-10-CM | POA: Diagnosis not present

## 2022-07-27 DIAGNOSIS — E785 Hyperlipidemia, unspecified: Secondary | ICD-10-CM

## 2022-07-27 DIAGNOSIS — E119 Type 2 diabetes mellitus without complications: Secondary | ICD-10-CM

## 2022-07-27 DIAGNOSIS — R718 Other abnormality of red blood cells: Secondary | ICD-10-CM | POA: Diagnosis not present

## 2022-07-27 DIAGNOSIS — F32A Depression, unspecified: Secondary | ICD-10-CM | POA: Diagnosis not present

## 2022-07-27 MED ORDER — BLOOD GLUCOSE MONITOR KIT: PACK | 0 refills | Status: DC

## 2022-07-27 NOTE — Assessment & Plan Note (Signed)
Lipid panel today.  Continue Crestor.

## 2022-07-27 NOTE — Patient Instructions (Signed)
Debrox 2-3 times/week.  Labs today.  Follow up in 6 months.  Take care  Dr. Lacinda Axon

## 2022-07-27 NOTE — Progress Notes (Signed)
Subjective:  Patient ID: [name] [name], female    DOB: Mar 27, 1961  Age: 62 y.o. MRN: [id number]  CC: Chief Complaint  Patient presents with   Hypertension    Pt arrives to follow up. Former pt of [place].     HPI:  62 year old female with the below mentioned past medical history presents for follow-up.  Patient states that she is feeling well.  Patient states that she needs a new glucometer.  She states that her does not work.  A1c has been well-controlled.  Needs updated labs.  She is compliant with semaglutide.  Denies any adverse side effects.  Hypertension well-controlled on atenolol.  Last lipid panel revealed mildly elevated LDL.  She is currently on Crestor.  Anxiety and depression are stable on Lexapro.  PHQ-9 score of 1 today.  GAD-7 score of 1.  Patient Active Problem List   Diagnosis Date Noted   Anxiety and depression 07/27/2022   Allergic rhinitis 07/15/2021   NAFLD (nonalcoholic fatty liver disease) 05/14/2021   Psychophysiological insomnia 08/23/2020   Hyperlipidemia associated with type 2 diabetes mellitus ([place]) 03/22/2017   Diabetes ([place])    IBS (irritable bowel syndrome) 12/10/2015   History of Chiari malformation 08/23/2014   Malignant neoplasm of upper-outer quadrant of left breast in female, estrogen receptor positive ([place]) 04/06/2013   Obesity (BMI 30-39.9) 12/14/2012   GERD (gastroesophageal reflux disease) 12/14/2012   Essential hypertension 09/02/2012    Social Hx   Social History   Socioeconomic History   Marital status: Married    Spouse name: Fritz Pickerel   Number of children: 4   Years of education: Not on file   Highest education level: Not on file  Occupational History   Occupation: Environmental manager work    Fish farm manager: [employer]  Tobacco Use   Smoking status: Former    Packs/day: 0.50    Years: 15.00    Total pack years: 7.50    Types: Cigarettes    Quit date: 07/06/1982    Years since quitting: 40.0   Smokeless tobacco: Never   Vaping Use   Vaping Use: Never used  Substance and Sexual Activity   Alcohol use: No   Drug use: No   Sexual activity: Yes    Partners: Male    Birth control/protection: Surgical    Comment: TAH  Other Topics Concern   Not on file  Social History Narrative   4 biological children   4 step children   13 grandchildren    1 great grandchildren   2 foster children   Social Determinants of Health   Financial Resource Strain: Low Risk  (07/24/2022)   Overall Financial Resource Strain (CARDIA)    Difficulty of Paying Living Expenses: Not hard at all  Food Insecurity: Food Insecurity Present (07/24/2022)   Hunger Vital Sign    Worried About [place] in the Last Year: Sometimes true    Ran Out of Food in the Last Year: Sometimes true  Transportation Needs: No Transportation Needs (07/24/2022)   PRAPARE - Hydrologist (Medical): No    Lack of Transportation (Non-Medical): No  Physical Activity: Inactive (07/24/2022)   Exercise Vital Sign    Days of Exercise per Week: 0 days    Minutes of Exercise per Session: 0 min  Stress: No Stress Concern Present (07/24/2022)   [place]    Feeling of Stress : Only a little  Social Connections:  Socially Integrated (07/24/2022)   Social Connection and Isolation Panel [NHANES]    Frequency of Communication with Friends and Family: More than three times a week    Frequency of Social Gatherings with Friends and Family: Once a week    Attends Religious Services: More than 4 times per year    Active Member of [employer] or Organizations: Yes    Attends Archivist Meetings: Never    Marital Status: Married    Review of Systems Per HPI  Objective:  BP 118/68   Pulse 60   Temp 97.7 F (36.5 C)   Ht '5\' 1"'$  (1.549 m)   Wt 140 lb 6.4 oz (63.7 kg)   LMP 12/04/2008   SpO2 99%   BMI 26.53 kg/m      07/27/2022   10:00 AM 07/24/2022     3:13 PM 06/23/2022    2:16 PM  BP/Weight  Systolic BP 976  734  Diastolic BP 68  80  Wt. (Lbs) 140.4 142 142.2  BMI 26.53 kg/m2 26.83 kg/m2 27.31 kg/m2    Physical Exam Vitals and nursing note reviewed.  Constitutional:      General: She is not in acute distress.    Appearance: Normal appearance.  HENT:     Head: Normocephalic and atraumatic.  Eyes:     General:        Right eye: No discharge.        Left eye: No discharge.     Conjunctiva/sclera: Conjunctivae normal.  Cardiovascular:     Rate and Rhythm: Normal rate and regular rhythm.  Pulmonary:     Effort: Pulmonary effort is normal.     Breath sounds: Normal breath sounds. No wheezing, rhonchi or rales.  Neurological:     Mental Status: She is alert.  Psychiatric:        Mood and Affect: Mood normal.        Behavior: Behavior normal.     Lab Results  Component Value Date   WBC 8.5 03/05/2022   HGB 12.9 03/05/2022   HCT 39.9 03/05/2022   PLT 223 03/05/2022   GLUCOSE 80 03/05/2022   CHOL 195 03/05/2022   TRIG 76 03/05/2022   HDL 70 03/05/2022   LDLCALC 111 (H) 03/05/2022   ALT 24 03/05/2022   AST 22 03/05/2022   NA 143 03/05/2022   K 4.1 03/05/2022   CL 103 03/05/2022   CREATININE 1.10 (H) 03/05/2022   BUN 11 03/05/2022   CO2 27 03/05/2022   TSH 1.990 05/08/2020   INR 1.0 06/06/2008   HGBA1C 5.9 (H) 11/25/2021     Assessment & Plan:   Problem List Items Addressed This Visit       Cardiovascular and Mediastinum   Essential hypertension    Well-controlled on atenolol.  Continue.      Relevant Orders   CMP14+EGFR     Endocrine   Diabetes ([place])    Has been stable on semaglutide.  Continue.  A1c today.      Relevant Orders   Hemoglobin A1c   Microalbumin / creatinine urine ratio   Hyperlipidemia associated with type 2 diabetes mellitus ([place])    Lipid panel today.  Continue Crestor.      Relevant Orders   Lipid panel     Other   Anxiety and depression    Stable.  Continue  Lexapro.      Other Visit Diagnoses     Microcytosis       Relevant Orders  CBC       Meds ordered this encounter  Medications   blood glucose meter kit and supplies KIT    Sig: Dispense based on patient and insurance preference. Use up to four times daily as directed.    Dispense:  1 each    Refill:  0    Order Specific Question:   Number of strips    Answer:   100    Order Specific Question:   Number of lancets    Answer:   100    Follow-up:  6 months  [place] DO [place]

## 2022-07-27 NOTE — Assessment & Plan Note (Signed)
Well-controlled on atenolol.  Continue.

## 2022-07-27 NOTE — Assessment & Plan Note (Signed)
Has been stable on semaglutide.  Continue.  A1c today.

## 2022-07-27 NOTE — Assessment & Plan Note (Signed)
Stable.  Continue Lexapro. 

## 2022-07-29 ENCOUNTER — Encounter: Payer: Self-pay | Admitting: Allergy & Immunology

## 2022-07-29 ENCOUNTER — Other Ambulatory Visit: Payer: Self-pay

## 2022-07-29 ENCOUNTER — Ambulatory Visit (INDEPENDENT_AMBULATORY_CARE_PROVIDER_SITE_OTHER): Payer: PPO | Admitting: Allergy & Immunology

## 2022-07-29 VITALS — BP 118/70 | HR 88 | Temp 97.2°F | Resp 18 | Ht 61.0 in | Wt 140.0 lb

## 2022-07-29 DIAGNOSIS — J309 Allergic rhinitis, unspecified: Secondary | ICD-10-CM

## 2022-07-29 DIAGNOSIS — J302 Other seasonal allergic rhinitis: Secondary | ICD-10-CM

## 2022-07-29 NOTE — Patient Instructions (Addendum)
1. Seasonal and perennial allergic rhinitis - Previous testing today showed: trees, dust mites, and cockroach. - Copy of test results provided.  - Continue with: Xyzal (levocetirizine) '5mg'$  tablet once daily - You can use an extra dose of the antihistamine, if needed, for breakthrough symptoms.  - Consider nasal saline rinses 1-2 times daily to remove allergens from the nasal cavities as well as help with mucous clearance (this is especially helpful to do before the nasal sprays are given) - Continue with allergy shots at the same schedule.   2. Return in about 6 months (around 01/27/2023).    Please inform us of any Emergency Department visits, hospitalizations, or changes in symptoms. Call us before going to the ED for breathing or allergy symptoms since we might be able to fit you in for a sick visit. Feel free to contact us anytime with any questions, problems, or concerns.  It was a pleasure to see you today!  Websites that have reliable patient information: 1. American Academy of Asthma, Allergy, and Immunology: www.aaaai.org 2. Food Allergy Research and Education (FARE): foodallergy.org 3. Mothers of Asthmatics: http://www.asthmacommunitynetwork.org 4. American College of Allergy, Asthma, and Immunology: www.acaai.org   COVID-19 Vaccine Information can be found at: ShippingScam.co.uk For questions related to vaccine distribution or appointments, please email [email or web address] or call [phone].   We realize that you might be concerned about having an allergic reaction to the COVID19 vaccines. To help with that concern, WE ARE OFFERING THE COVID19 VACCINES IN OUR OFFICE! Ask the front desk for dates!     "Like" [place] on Facebook and Instagram for our latest updates!      A healthy democracy works best when [employer] participate! Make sure you are registered to vote! If you have moved or changed any of your contact  information, you will need to get this updated before voting!  In some cases, you MAY be able to register to vote online: [email or web address]

## 2022-07-29 NOTE — Progress Notes (Signed)
FOLLOW UP  Date of Service/Encounter:  07/29/22   Assessment:   Seasonal and perennial allergic rhinitis (trees, dust mites, and cockroach)   Plan/Recommendations:   1. Seasonal and perennial allergic rhinitis - Previous testing today showed: trees, dust mites, and cockroach. - Copy of test results provided.  - Continue with: Xyzal (levocetirizine) '5mg'$  tablet once daily - You can use an extra dose of the antihistamine, if needed, for breakthrough symptoms.  - Consider nasal saline rinses 1-2 times daily to remove allergens from the nasal cavities as well as help with mucous clearance (this is especially helpful to do before the nasal sprays are given) - Continue with allergy shots at the same schedule.   2. Return in about 6 months (around 01/27/2023).   Subjective:   [name] [name] is a 62 y.o. female presenting today for follow up of  Chief Complaint  Patient presents with   Follow-up    FOLLOW UP PT STATES EVERYTHING HAS BEEN GOING GOOD SO FAR.    [name] [name] has a history of the following: Patient Active Problem List   Diagnosis Date Noted   Anxiety and depression 07/27/2022   Allergic rhinitis 07/15/2021   NAFLD (nonalcoholic fatty liver disease) 05/14/2021   Psychophysiological insomnia 08/23/2020   Hyperlipidemia associated with type 2 diabetes mellitus ([place]) 03/22/2017   Diabetes ([place])    IBS (irritable bowel syndrome) 12/10/2015   History of Chiari malformation 08/23/2014   Malignant neoplasm of upper-outer quadrant of left breast in female, estrogen receptor positive ([place]) 04/06/2013   Obesity (BMI 30-39.9) 12/14/2012   GERD (gastroesophageal reflux disease) 12/14/2012   Essential hypertension 09/02/2012    History obtained from: chart review and patient.  [name] [name] is a 62 y.o. female presenting for a follow up visit. She was last seen in October 2023. At that time, she had testing that was positive to multiple indoor and outdoor allergens.  We  continued with levocetirizine daily.  She did decide to start allergy shots.  We decided to ignore her cockroach allergy so we will continue to mix 1 mild.  Since the last visit, she has done well.   She did come in to start injections in December. We had mixed our own vials because she did not have hers from the previous practice. She is now back on 0.68m of the Red Vial, which contains dust mites and tree. She has been tolerating these without a problem.  She thinks that she started allergy shots around 2 years ago.  However, we do not have the exact dates from her previous allergist.  She was on the maintenance vial with her previous allergist as well.   From a symptom standpoint, she feels that she is doing very well.  She is doing levocetirizine daily.  She has not tried weaning, but is open to the idea.  She continues to take care of her 2 foster children.  She is in the process of adopting the oldest 1.  She will likely about the second 1 as well.  Otherwise, there have been no changes to her past medical history, surgical history, family history, or social history.    Review of Systems  Constitutional: Negative.  Negative for fever, malaise/fatigue and weight loss.  HENT:  Positive for congestion and sinus pain. Negative for ear discharge, ear pain and sore throat.   Eyes:  Negative for pain, discharge and redness.  Respiratory:  Negative for cough, sputum production, shortness of breath and wheezing.  Cardiovascular: Negative.  Negative for chest pain and palpitations.  Gastrointestinal:  Negative for abdominal pain, heartburn, nausea and vomiting.  Skin: Negative.  Negative for itching and rash.  Neurological:  Negative for dizziness and headaches.  Endo/Heme/Allergies:  Negative for environmental allergies. Does not bruise/bleed easily.       Objective:   Blood pressure 118/70, pulse 88, temperature (!) 97.2 F (36.2 C), resp. rate 18, height '5\' 1"'$  (1.549 m), weight 140 lb  (63.5 kg), last menstrual period 12/04/2008, SpO2 97 %. Body mass index is 26.45 kg/m.    Physical Exam Vitals reviewed.  Constitutional:      Appearance: She is well-developed.     Comments: Delightful.  HENT:     Head: Normocephalic and atraumatic.     Right Ear: Tympanic membrane, ear canal and external ear normal. No drainage, swelling or tenderness. Tympanic membrane is not injected, scarred, erythematous, retracted or bulging.     Left Ear: Tympanic membrane, ear canal and external ear normal. No drainage, swelling or tenderness. Tympanic membrane is not injected, scarred, erythematous, retracted or bulging.     Nose: No nasal deformity, septal deviation, mucosal edema or rhinorrhea.     Right Turbinates: Enlarged, swollen and pale.     Left Turbinates: Enlarged, swollen and pale.     Right Sinus: No maxillary sinus tenderness or frontal sinus tenderness.     Left Sinus: No maxillary sinus tenderness or frontal sinus tenderness.     Mouth/Throat:     Mouth: Mucous membranes are not pale and not dry.     Pharynx: Uvula midline.  Eyes:     General:        Right eye: No discharge.        Left eye: No discharge.     Conjunctiva/sclera: Conjunctivae normal.     Right eye: Right conjunctiva is not injected. No chemosis.    Left eye: Left conjunctiva is not injected. No chemosis.    Pupils: Pupils are equal, round, and reactive to light.  Cardiovascular:     Rate and Rhythm: Normal rate and regular rhythm.     Heart sounds: Normal heart sounds.  Pulmonary:     Effort: Pulmonary effort is normal. No tachypnea, accessory muscle usage or respiratory distress.     Breath sounds: Normal breath sounds. No wheezing, rhonchi or rales.  Chest:     Chest wall: No tenderness.  Lymphadenopathy:     Head:     Right side of head: No submandibular, tonsillar or occipital adenopathy.     Left side of head: No submandibular, tonsillar or occipital adenopathy.     Cervical: No cervical  adenopathy.  Skin:    Coloration: Skin is not pale.     Findings: No abrasion, erythema, petechiae or rash. Rash is not papular, urticarial or vesicular.  Neurological:     Mental Status: She is alert.  Psychiatric:        Behavior: Behavior is cooperative.        Diagnostic studies: none     Salvatore Marvel, MD  [facility]

## 2022-08-07 ENCOUNTER — Ambulatory Visit (INDEPENDENT_AMBULATORY_CARE_PROVIDER_SITE_OTHER): Payer: PPO

## 2022-08-07 DIAGNOSIS — J309 Allergic rhinitis, unspecified: Secondary | ICD-10-CM | POA: Diagnosis not present

## 2022-08-12 DIAGNOSIS — E119 Type 2 diabetes mellitus without complications: Secondary | ICD-10-CM | POA: Diagnosis not present

## 2022-08-12 DIAGNOSIS — I10 Essential (primary) hypertension: Secondary | ICD-10-CM | POA: Diagnosis not present

## 2022-08-12 DIAGNOSIS — E785 Hyperlipidemia, unspecified: Secondary | ICD-10-CM | POA: Diagnosis not present

## 2022-08-12 DIAGNOSIS — E1169 Type 2 diabetes mellitus with other specified complication: Secondary | ICD-10-CM | POA: Diagnosis not present

## 2022-08-12 DIAGNOSIS — R718 Other abnormality of red blood cells: Secondary | ICD-10-CM | POA: Diagnosis not present

## 2022-08-14 ENCOUNTER — Ambulatory Visit (INDEPENDENT_AMBULATORY_CARE_PROVIDER_SITE_OTHER): Payer: PPO

## 2022-08-14 DIAGNOSIS — J309 Allergic rhinitis, unspecified: Secondary | ICD-10-CM

## 2022-08-14 LAB — CBC
Hematocrit: 37.5 % (ref 34.0–46.6)
Hemoglobin: 12.7 g/dL (ref 11.1–15.9)
MCH: 27.2 pg (ref 26.6–33.0)
MCHC: 33.9 g/dL (ref 31.5–35.7)
MCV: 80 fL (ref 79–97)
Platelets: 252 10*3/uL (ref 150–450)
RBC: 4.67 x10E6/uL (ref 3.77–5.28)
RDW: 13.6 % (ref 11.7–15.4)
WBC: 8.7 10*3/uL (ref 3.4–10.8)

## 2022-08-14 LAB — CMP14+EGFR
ALT: 15 IU/L (ref 0–32)
AST: 19 IU/L (ref 0–40)
Albumin/Globulin Ratio: 1.6 (ref 1.2–2.2)
Albumin: 4.4 g/dL (ref 3.9–4.9)
Alkaline Phosphatase: 71 IU/L (ref 44–121)
BUN/Creatinine Ratio: 10 — ABNORMAL LOW (ref 12–28)
BUN: 8 mg/dL (ref 8–27)
Bilirubin Total: 0.4 mg/dL (ref 0.0–1.2)
CO2: 23 mmol/L (ref 20–29)
Calcium: 9.6 mg/dL (ref 8.7–10.3)
Chloride: 105 mmol/L (ref 96–106)
Creatinine, Ser: 0.82 mg/dL (ref 0.57–1.00)
Globulin, Total: 2.7 g/dL (ref 1.5–4.5)
Glucose: 89 mg/dL (ref 70–99)
Potassium: 3.8 mmol/L (ref 3.5–5.2)
Sodium: 145 mmol/L — ABNORMAL HIGH (ref 134–144)
Total Protein: 7.1 g/dL (ref 6.0–8.5)
eGFR: 81 mL/min/{1.73_m2} (ref >59)

## 2022-08-14 LAB — LIPID PANEL
Chol/HDL Ratio: 2.6 ratio (ref 0.0–4.4)
Cholesterol, Total: 180 mg/dL (ref 100–199)
HDL: 68 mg/dL (ref >39)
LDL Chol Calc (NIH): 93 mg/dL (ref 0–99)
Triglycerides: 104 mg/dL (ref 0–149)
VLDL Cholesterol Cal: 19 mg/dL (ref 5–40)

## 2022-08-14 LAB — HEMOGLOBIN A1C
Est. average glucose Bld gHb Est-mCnc: 114 mg/dL
Hgb A1c MFr Bld: 5.6 % (ref 4.8–5.6)

## 2022-08-14 LAB — MICROALBUMIN / CREATININE URINE RATIO
Creatinine, Urine: 182.5 mg/dL
Microalb/Creat Ratio: 13 mg/g creat (ref 0–29)
Microalbumin, Urine: 23.5 ug/mL

## 2022-08-19 ENCOUNTER — Other Ambulatory Visit: Payer: Self-pay | Admitting: Family Medicine

## 2022-08-20 DIAGNOSIS — M1990 Unspecified osteoarthritis, unspecified site: Secondary | ICD-10-CM | POA: Diagnosis not present

## 2022-08-20 DIAGNOSIS — I10 Essential (primary) hypertension: Secondary | ICD-10-CM | POA: Diagnosis not present

## 2022-08-20 DIAGNOSIS — M62830 Muscle spasm of back: Secondary | ICD-10-CM | POA: Diagnosis not present

## 2022-08-20 DIAGNOSIS — E785 Hyperlipidemia, unspecified: Secondary | ICD-10-CM | POA: Diagnosis not present

## 2022-08-20 DIAGNOSIS — E1169 Type 2 diabetes mellitus with other specified complication: Secondary | ICD-10-CM | POA: Diagnosis not present

## 2022-08-20 DIAGNOSIS — G8929 Other chronic pain: Secondary | ICD-10-CM | POA: Diagnosis not present

## 2022-08-20 DIAGNOSIS — J309 Allergic rhinitis, unspecified: Secondary | ICD-10-CM | POA: Diagnosis not present

## 2022-08-20 DIAGNOSIS — E663 Overweight: Secondary | ICD-10-CM | POA: Diagnosis not present

## 2022-08-20 DIAGNOSIS — E1165 Type 2 diabetes mellitus with hyperglycemia: Secondary | ICD-10-CM | POA: Diagnosis not present

## 2022-08-20 DIAGNOSIS — K219 Gastro-esophageal reflux disease without esophagitis: Secondary | ICD-10-CM | POA: Diagnosis not present

## 2022-08-20 DIAGNOSIS — G4700 Insomnia, unspecified: Secondary | ICD-10-CM | POA: Diagnosis not present

## 2022-08-20 DIAGNOSIS — F325 Major depressive disorder, single episode, in full remission: Secondary | ICD-10-CM | POA: Diagnosis not present

## 2022-08-25 ENCOUNTER — Other Ambulatory Visit: Payer: Self-pay | Admitting: Family Medicine

## 2022-09-02 ENCOUNTER — Ambulatory Visit (INDEPENDENT_AMBULATORY_CARE_PROVIDER_SITE_OTHER): Payer: PPO

## 2022-09-02 DIAGNOSIS — J309 Allergic rhinitis, unspecified: Secondary | ICD-10-CM

## 2022-09-07 ENCOUNTER — Ambulatory Visit: Payer: Medicare HMO | Admitting: Physician Assistant

## 2022-09-09 DIAGNOSIS — M25511 Pain in right shoulder: Secondary | ICD-10-CM | POA: Diagnosis not present

## 2022-09-11 ENCOUNTER — Ambulatory Visit (INDEPENDENT_AMBULATORY_CARE_PROVIDER_SITE_OTHER): Payer: HMO | Admitting: Urology

## 2022-09-11 ENCOUNTER — Other Ambulatory Visit: Payer: Self-pay | Admitting: Internal Medicine

## 2022-09-11 ENCOUNTER — Encounter: Payer: Self-pay | Admitting: Urology

## 2022-09-11 ENCOUNTER — Ambulatory Visit (INDEPENDENT_AMBULATORY_CARE_PROVIDER_SITE_OTHER): Payer: HMO

## 2022-09-11 VITALS — BP 110/66 | HR 75

## 2022-09-11 DIAGNOSIS — R319 Hematuria, unspecified: Secondary | ICD-10-CM | POA: Diagnosis not present

## 2022-09-11 DIAGNOSIS — J309 Allergic rhinitis, unspecified: Secondary | ICD-10-CM | POA: Diagnosis not present

## 2022-09-11 DIAGNOSIS — N3001 Acute cystitis with hematuria: Secondary | ICD-10-CM | POA: Diagnosis not present

## 2022-09-11 LAB — URINALYSIS, ROUTINE W REFLEX MICROSCOPIC
Bilirubin, UA: NEGATIVE
Glucose, UA: NEGATIVE
Ketones, UA: NEGATIVE
Nitrite, UA: NEGATIVE
Specific Gravity, UA: 1.02 (ref 1.005–1.030)
Urobilinogen, Ur: 1 mg/dL (ref 0.2–1.0)
pH, UA: 5.5 (ref 5.0–7.5)

## 2022-09-11 LAB — MICROSCOPIC EXAMINATION: Bacteria, UA: NONE SEEN

## 2022-09-11 NOTE — Patient Instructions (Signed)

## 2022-09-11 NOTE — Progress Notes (Signed)
09/11/2022 10:20 AM   [name] [name] Jun 25, 1961 [id number]  Referring provider: Claire Shown, FNP No address on file  Followup hematuria   HPI: [name] [name] is a 62yo here for followup for hematuria. No gross hematuria since last visit. No significant LUTS. No dysuira. UA shows no RBCs today.   PMH: Past Medical History:  Diagnosis Date   Allergic rhinitis    Allergy    Anemia, unspecified 05/30/2013   Anxiety    Breast cancer ([name]) 03/31/2013   left   Cancer ([name])    Chest pain 2009   Consultation-[facility], negative chest CT; nl echo in 2005; h/o palpitations   Chiari malformation type I ([name])    s/p suboccipital craniectomy, C1 laminectomy, superior C2 laminectomy, duraplasty 05/10/14   Colitis 2010   not IBD   Degenerative joint disease    + degenerative joint disease of the lumbosacral spine   Depression    Diabetes ([name])    Diabetes ([name])    Dysrhythmia    palpations   Genetic testing 07/01/2016   GERD (gastroesophageal reflux disease)    "a little"   Headache    Heart murmur    "small"   Herpes simplex type II infection    Hypercholesteremia    "slightly high"   Hypertension    does not have high blood pressure   Meningitis 08/22/2014   Palpitations    Personal history of chemotherapy    Personal history of radiation therapy    Urticaria    Wears glasses     Surgical History: Past Surgical History:  Procedure Laterality Date   ABDOMINAL HYSTERECTOMY  03/13/2007   TAH ?BSO--Dr Levin Bacon, [place]   BIOPSY  01/14/2021   Procedure: BIOPSY;  Surgeon: Eloise Harman, DO;  [facility];  Service: Endoscopy;;   BRAIN SURGERY     BREAST EXCISIONAL BIOPSY  04/2003   Left; benign disease   BREAST LUMPECTOMY Left 09/2013   BREAST LUMPECTOMY WITH NEEDLE LOCALIZATION AND AXILLARY LYMPH NODE DISSECTION Left 09/12/2013   Procedure: BREAST LUMPECTOMY WITH NEEDLE LOCALIZATION AND AXILLARY LYMPH NODE DISSECTION;  Surgeon: [name], MD;  Location: [facility];  Service: General;  Laterality: Left;  and axilla   BREAST SURGERY     COLONOSCOPY  10/2010   proctitis; melanosis coli   COLONOSCOPY WITH PROPOFOL N/A 01/04/2018   Dr. [name]: hemorrhoids, follow up colonoscopy in 10 years   EAR CYST EXCISION Right 09/04/2019   Procedure: EXCISION EAR MASS;  Surgeon: [name], MD;  Location: [facility];  Service: ENT;  Laterality: Right;   ESOPHAGOGASTRODUODENOSCOPY (EGD) WITH PROPOFOL N/A 01/14/2021   Procedure: ESOPHAGOGASTRODUODENOSCOPY (EGD) WITH PROPOFOL;  Surgeon: Eloise Harman, DO;  [facility];  Service: Endoscopy;  Laterality: N/A;  9:00am   EXCISION/RELEASE BURSA HIP Left 03/17/2019   Procedure: Left hip open bursectomy;  Surgeon: Nicholes Stairs, MD;  Location: [facility];  Service: Orthopedics;  Laterality: Left;  75 mins   EYE SURGERY Left    "fix lazy eye"   PORTACATH PLACEMENT Right 04/21/2013   Procedure: INSERTION PORT-A-CATH;  Surgeon: [name], MD;  [facility];  Service: General;  Laterality: Right;   RIGHT OOPHORECTOMY  03/13/2007   SUBOCCIPITAL CRANIECTOMY CERVICAL LAMINECTOMY N/A 05/10/2014   Procedure: SUBOCCIPITAL CRANIECTOMY CERVICAL LAMINECTOMY/DURAPLASTY;  Surgeon: [name], MD;  Location: [facility];  Service: Neurosurgery;  Laterality: N/A;  suboccipital craniectomy with cervical laminectomy and  duraplasty   TUBAL LIGATION      Home Medications:  Allergies as of 09/11/2022       Reactions   Doxycycline Itching, Swelling        Medication List        Accurate as of September 11, 2022 10:20 AM. If you have any questions, ask your nurse or doctor.          atenolol 50 MG tablet Commonly known as: TENORMIN Take 1 tablet (50 mg total) by mouth daily.   diphenhydramine-acetaminophen 25-500 MG Tabs tablet Commonly known as: TYLENOL PM Take 1 tablet by mouth at bedtime as needed (sleep).   EPINEPHrine 0.3 mg/0.3 mL  Soaj injection Commonly known as: EPI-PEN Inject 0.3 mg into the muscle as needed for anaphylaxis.   escitalopram 20 MG tablet Commonly known as: LEXAPRO TAKE 1 TABLET BY MOUTH EVERY DAY   famotidine 40 MG tablet Commonly known as: PEPCID TAKE 1 TABLET BY MOUTH EVERYDAY AT BEDTIME   fluticasone 50 MCG/ACT nasal spray Commonly known as: FLONASE Place 2 sprays into both nostrils daily.   levocetirizine 5 MG tablet Commonly known as: XYZAL Take 5 mg by mouth every evening.   LORazepam 0.5 MG tablet Commonly known as: ATIVAN Take 1 tablet (0.5 mg total) by mouth at bedtime as needed for sleep.   Melatonin 10 MG Tabs Take 10 mg by mouth at bedtime.   methocarbamol 500 MG tablet Commonly known as: ROBAXIN Take 500 mg by mouth daily as needed for muscle spasms.   ondansetron 4 MG disintegrating tablet Commonly known as: ZOFRAN-ODT Take 1 tablet (4 mg total) by mouth every 8 (eight) hours as needed for nausea or vomiting.   ONE TOUCH ULTRA 2 w/Device Kit DISPENSE BASED ON PATIENT AND INSURANCE PREFERENCE. USE UP TO FOUR TIMES DAILY AS DIRECTED.   OneTouch Delica Plus [id number] Misc USE TO TEST BLOOD SUGAR 4 TIMES A DAY   OneTouch Ultra test strip Generic drug: glucose blood USE TO TEST BLOOD SUGAR 4 TIMES A DAY   Ozempic (0.25 or 0.5 MG/DOSE) 2 MG/3ML Sopn Generic drug: Semaglutide(0.25 or 0.'5MG'$ /DOS) Inject 0.5 mg into the skin once a week.   pantoprazole 40 MG tablet Commonly known as: PROTONIX TAKE 1 TABLET (40 MG TOTAL) BY MOUTH 2 (TWO) TIMES DAILY BEFORE A MEAL.   Pen Needles 5/16" 30G X 8 MM Misc Use with victoza   rosuvastatin 20 MG tablet Commonly known as: Crestor Take 1 tablet (20 mg total) by mouth daily.   valACYclovir 500 MG tablet Commonly known as: VALTREX Take 1 tablet (500 mg total) by mouth daily.        Allergies:  Allergies  Allergen Reactions   Doxycycline Itching and Swelling    Family History: Family History  Problem Relation  Age of Onset   Aneurysm Mother        Cerebral   Hypertension Mother    Hyperlipidemia Mother    Stroke Mother    Depression Mother    Coronary artery disease Father    Hypertension Father    Hyperlipidemia Father    Heart attack Father        d. 31   Lung cancer Father        dx early 15s; former smoker   Lung cancer Sister        paternal half-sister; not a smoker; dx <60; d. 31y   Breast cancer Sister 78   Other Sister  hx of hysterectomy for unspecified reason   Eczema Brother    Lung cancer Brother    Lung cancer Brother        d. 65y; smoker   Multiple myeloma Brother        d. 72s   Lung cancer Brother        dx late 15s; smoker   Arthritis Brother    Prostate cancer Brother        dx late 86s   Prostate cancer Brother        dx late 39s   Lung cancer Brother    Diabetes Brother    Leukemia Maternal Aunt        "blood cancer"; d. unspecified age   Lung cancer Maternal Aunt        d. unspecified age   Breast cancer Maternal Aunt        dx unspecified age   Cervical cancer Paternal Aunt        d. late 74s   Anxiety disorder Daughter    Prostate cancer Cousin        maternal 1st cousin; dx older age   Breast cancer Other        niece dx early 39s or before   Leukemia Other        nephew d. 2y; "blood cancer"   Colon cancer Neg Hx     Social History:  reports that she quit smoking about 40 years ago. Her smoking use included cigarettes. She has a 7.50 pack-year smoking history. She has never used smokeless tobacco. She reports that she does not drink alcohol and does not use drugs.  ROS: All other review of systems were reviewed and are negative except what is noted above in HPI  Physical Exam: BP 110/66   Pulse 75   LMP 12/04/2008   Constitutional:  Alert and oriented, No acute distress. HEENT: [place] AT, moist mucus membranes.  Trachea midline, no masses. Cardiovascular: No clubbing, cyanosis, or edema. Respiratory: Normal respiratory effort, no  increased work of breathing. GI: Abdomen is soft, nontender, nondistended, no abdominal masses GU: No CVA tenderness.  Lymph: No cervical or inguinal lymphadenopathy. Skin: No rashes, bruises or suspicious lesions. Neurologic: Grossly intact, no focal deficits, moving all 4 extremities. Psychiatric: Normal mood and affect.  Laboratory Data: Lab Results  Component Value Date   WBC 8.7 08/12/2022   HGB 12.7 08/12/2022   HCT 37.5 08/12/2022   MCV 80 08/12/2022   PLT 252 08/12/2022    Lab Results  Component Value Date   CREATININE 0.82 08/12/2022    No results found for: "PSA"  No results found for: "TESTOSTERONE"  Lab Results  Component Value Date   HGBA1C 5.6 08/12/2022    Urinalysis    Component Value Date/Time   COLORURINE YELLOW 04/15/2016 1515   APPEARANCEUR Clear 03/17/2022 0842   LABSPEC 1.025 04/15/2016 1515   LABSPEC 1.015 09/07/2013 1136   PHURINE 5.5 04/15/2016 1515   GLUCOSEU Negative 03/17/2022 0842   GLUCOSEU Negative 09/07/2013 1136   HGBUR TRACE (A) 04/15/2016 1515   BILIRUBINUR Negative 03/17/2022 0842   BILIRUBINUR Negative 09/07/2013 1136   KETONESUR negative 03/05/2022 0952   KETONESUR 15 (A) 04/15/2016 1515   PROTEINUR Negative 03/17/2022 0842   PROTEINUR NEGATIVE 04/15/2016 1515   UROBILINOGEN 0.2 03/05/2022 0952   UROBILINOGEN 1.0 08/22/2014 1309   UROBILINOGEN 0.2 09/07/2013 1136   NITRITE Negative 03/17/2022 0842   NITRITE NEGATIVE 04/15/2016 1515   LEUKOCYTESUR Trace (A)  03/17/2022 0842   LEUKOCYTESUR Trace 09/07/2013 1136    Lab Results  Component Value Date   LABMICR 23.5 08/12/2022   WBCUA 0-5 03/17/2022   LABEPIT 0-10 03/17/2022   BACTERIA None seen 03/17/2022    Pertinent Imaging:  No results found for this or any previous visit.  No results found for this or any previous visit.  No results found for this or any previous visit.  No results found for this or any previous visit.  No results found for this or any  previous visit.  No valid procedures specified. Results for orders placed in visit on 03/05/22  CT HEMATURIA WORKUP  Narrative CLINICAL DATA:  Painless hematuria  EXAM: CT ABDOMEN AND PELVIS WITHOUT AND WITH CONTRAST  TECHNIQUE: Multidetector CT imaging of the abdomen and pelvis was performed following the standard protocol before and following the bolus administration of intravenous contrast.  RADIATION DOSE REDUCTION: This exam was performed according to the departmental dose-optimization program which includes automated exposure control, adjustment of the mA and/or kV according to patient size and/or use of iterative reconstruction technique.  CONTRAST:  159m OMNIPAQUE IOHEXOL 300 MG/ML  SOLN  COMPARISON:  Ultrasound 11/07/2021 and CT scan 06/22/2018  FINDINGS: Lower chest: Suspected right coronary artery atherosclerotic calcification.  Hepatobiliary: Unremarkable  Pancreas: Unremarkable  Spleen: Unremarkable  Adrenals/Urinary Tract: Mild scarring in the left kidney upper pole.  No urinary tract calculi, abnormal renal parenchymal enhancement, or abnormal urographic phase filling defect along the urothelium to explain the patient's hematuria.  Stomach/Bowel: Unremarkable  Vascular/Lymphatic: Atherosclerosis is present, including aortoiliac atherosclerotic disease.  Reproductive: Uterus absent. Mild fullness of left ovarian/adnexal tissues, 3.8 by 2.2 cm on image 68 series 7, formerly 4.0 by 2.6 cm on 06/12/2018, probably benign/incidental.  Other: No supplemental non-categorized findings.  Musculoskeletal: Stable chronic sclerosis in the left pedicle and adjacent vertebral body at T12. The lack of change from 06/12/2018 favors benign etiology.  IMPRESSION: 1. No urinary tract calculi, abnormal renal parenchymal enhancement, or abnormal urographic phase filling defect along the urothelium to explain the patient's hematuria. 2. Aortic Atherosclerosis  (ICD10-I70.0). Suspected right coronary artery atherosclerosis.   Electronically Signed By: WVan ClinesM.D. On: 03/10/2022 09:54  No results found for this or any previous visit.   Assessment & Plan:    1. Acute cystitis with hematuria Followup 1 year with renal [place]- Urinalysis, Routine w reflex microscopic   No follow-ups on file.  PNicolette Bang MD  [facility] [place]

## 2022-09-15 DIAGNOSIS — J3089 Other allergic rhinitis: Secondary | ICD-10-CM | POA: Diagnosis not present

## 2022-09-15 NOTE — Progress Notes (Signed)
VIAL EXP 09-15-23

## 2022-09-16 DIAGNOSIS — M25511 Pain in right shoulder: Secondary | ICD-10-CM | POA: Diagnosis not present

## 2022-09-18 ENCOUNTER — Ambulatory Visit (INDEPENDENT_AMBULATORY_CARE_PROVIDER_SITE_OTHER): Payer: HMO | Admitting: *Deleted

## 2022-09-18 DIAGNOSIS — J309 Allergic rhinitis, unspecified: Secondary | ICD-10-CM

## 2022-09-24 DIAGNOSIS — M75121 Complete rotator cuff tear or rupture of right shoulder, not specified as traumatic: Secondary | ICD-10-CM | POA: Diagnosis not present

## 2022-10-05 ENCOUNTER — Other Ambulatory Visit: Payer: Self-pay

## 2022-10-05 ENCOUNTER — Encounter (HOSPITAL_BASED_OUTPATIENT_CLINIC_OR_DEPARTMENT_OTHER): Payer: Self-pay | Admitting: Orthopedic Surgery

## 2022-10-05 NOTE — Progress Notes (Signed)
   10/05/22 1310  PAT Phone Screen  Is the patient taking a GLP-1 receptor agonist? Yes  Has the patient been informed on holding medication? Yes  Do You Have Diabetes? Yes  Do You Have Hypertension? Yes  Have You Ever Been to the ER for Asthma? No  Have You Taken Oral Steroids in the Past 3 Months? No  Do you Take Phenteramine or any Other Diet Drugs? No  Recent  Lab Work, EKG, CXR? No  Do you have a history of heart problems? No  Have you ever had tests on your heart? Yes  What cardiac tests were performed? Echo  What date/year were cardiac tests completed? Echo 2018 EF 55-60%  Results viewable: [facility] Media Tab  Any Recent Hospitalizations? No  Height 5\' 1"  (1.549 m)  Weight 62.1 kg  Pat Appointment Scheduled Yes

## 2022-10-07 ENCOUNTER — Other Ambulatory Visit: Payer: Self-pay

## 2022-10-07 ENCOUNTER — Other Ambulatory Visit: Payer: Self-pay | Admitting: Family Medicine

## 2022-10-07 ENCOUNTER — Encounter (HOSPITAL_BASED_OUTPATIENT_CLINIC_OR_DEPARTMENT_OTHER)
Admission: RE | Admit: 2022-10-07 | Discharge: 2022-10-07 | Disposition: A | Discharge status: Home / Self Care | Payer: HMO | Source: Ambulatory Visit | Attending: Orthopedic Surgery | Admitting: Orthopedic Surgery

## 2022-10-07 DIAGNOSIS — Z01818 Encounter for other preprocedural examination: Secondary | ICD-10-CM | POA: Diagnosis not present

## 2022-10-07 DIAGNOSIS — E119 Type 2 diabetes mellitus without complications: Secondary | ICD-10-CM

## 2022-10-07 LAB — BASIC METABOLIC PANEL
Anion gap: 8 (ref 5–15)
BUN: 7 mg/dL — ABNORMAL LOW (ref 8–23)
CO2: 27 mmol/L (ref 22–32)
Calcium: 9.2 mg/dL (ref 8.9–10.3)
Chloride: 106 mmol/L (ref 98–111)
Creatinine, Ser: 0.97 mg/dL (ref 0.44–1.00)
GFR, Estimated: >60 mL/min (ref >60)
Glucose, Bld: 80 mg/dL (ref 70–99)
Potassium: 4.2 mmol/L (ref 3.5–5.1)
Sodium: 141 mmol/L (ref 135–145)

## 2022-10-07 NOTE — Progress Notes (Signed)
      Enhanced Recovery after Surgery for Orthopedics Enhanced Recovery after Surgery is a protocol used to improve the stress on your body and your recovery after surgery.  Patient Instructions  The night before surgery:  No food after midnight. ONLY clear liquids after midnight   The day of surgery (if you have diabetes): Drink ONE (1) Gatorade 2 (G2) as directed. This drink was given to you during your hospital  pre-op appointment visit.  The pre-op nurse will instruct you on the time to drink the   Gatorade 2 (G2) depending on your surgery time. Color of the Gatorade may vary. Red is not allowed. Nothing else to drink after completing the  Gatorade 2 (G2).         If you have questions, please contact your surgeon's office.

## 2022-10-09 ENCOUNTER — Ambulatory Visit (HOSPITAL_BASED_OUTPATIENT_CLINIC_OR_DEPARTMENT_OTHER): Payer: HMO | Admitting: Certified Registered"

## 2022-10-09 ENCOUNTER — Ambulatory Visit (HOSPITAL_BASED_OUTPATIENT_CLINIC_OR_DEPARTMENT_OTHER)
Admission: RE | Admit: 2022-10-09 | Discharge: 2022-10-09 | Disposition: A | Discharge status: Home / Self Care | Payer: HMO | Source: Ambulatory Visit | Attending: Orthopedic Surgery | Admitting: Orthopedic Surgery

## 2022-10-09 ENCOUNTER — Other Ambulatory Visit: Payer: Self-pay

## 2022-10-09 ENCOUNTER — Encounter (HOSPITAL_BASED_OUTPATIENT_CLINIC_OR_DEPARTMENT_OTHER)
Admission: RE | Disposition: A | Discharge status: Home / Self Care | Payer: Self-pay | Source: Ambulatory Visit | Attending: Orthopedic Surgery

## 2022-10-09 ENCOUNTER — Encounter (HOSPITAL_BASED_OUTPATIENT_CLINIC_OR_DEPARTMENT_OTHER): Payer: Self-pay | Admitting: Orthopedic Surgery

## 2022-10-09 DIAGNOSIS — Z794 Long term (current) use of insulin: Secondary | ICD-10-CM | POA: Diagnosis not present

## 2022-10-09 DIAGNOSIS — M75121 Complete rotator cuff tear or rupture of right shoulder, not specified as traumatic: Secondary | ICD-10-CM | POA: Insufficient documentation

## 2022-10-09 DIAGNOSIS — Z79899 Other long term (current) drug therapy: Secondary | ICD-10-CM | POA: Insufficient documentation

## 2022-10-09 DIAGNOSIS — Z09 Encounter for follow-up examination after completed treatment for conditions other than malignant neoplasm: Secondary | ICD-10-CM | POA: Insufficient documentation

## 2022-10-09 DIAGNOSIS — I10 Essential (primary) hypertension: Secondary | ICD-10-CM | POA: Diagnosis not present

## 2022-10-09 DIAGNOSIS — Z7985 Long-term (current) use of injectable non-insulin antidiabetic drugs: Secondary | ICD-10-CM | POA: Diagnosis not present

## 2022-10-09 DIAGNOSIS — S46111A Strain of muscle, fascia and tendon of long head of biceps, right arm, initial encounter: Secondary | ICD-10-CM | POA: Diagnosis not present

## 2022-10-09 DIAGNOSIS — M7541 Impingement syndrome of right shoulder: Secondary | ICD-10-CM | POA: Diagnosis not present

## 2022-10-09 DIAGNOSIS — K219 Gastro-esophageal reflux disease without esophagitis: Secondary | ICD-10-CM | POA: Diagnosis not present

## 2022-10-09 DIAGNOSIS — M25811 Other specified joint disorders, right shoulder: Secondary | ICD-10-CM | POA: Insufficient documentation

## 2022-10-09 DIAGNOSIS — F418 Other specified anxiety disorders: Secondary | ICD-10-CM | POA: Insufficient documentation

## 2022-10-09 DIAGNOSIS — Z87891 Personal history of nicotine dependence: Secondary | ICD-10-CM | POA: Insufficient documentation

## 2022-10-09 DIAGNOSIS — X58XXXA Exposure to other specified factors, initial encounter: Secondary | ICD-10-CM | POA: Diagnosis not present

## 2022-10-09 DIAGNOSIS — E119 Type 2 diabetes mellitus without complications: Secondary | ICD-10-CM | POA: Insufficient documentation

## 2022-10-09 DIAGNOSIS — M7521 Bicipital tendinitis, right shoulder: Secondary | ICD-10-CM | POA: Diagnosis not present

## 2022-10-09 DIAGNOSIS — G8918 Other acute postprocedural pain: Secondary | ICD-10-CM | POA: Diagnosis not present

## 2022-10-09 DIAGNOSIS — M25511 Pain in right shoulder: Secondary | ICD-10-CM | POA: Diagnosis present

## 2022-10-09 DIAGNOSIS — S43431A Superior glenoid labrum lesion of right shoulder, initial encounter: Secondary | ICD-10-CM | POA: Insufficient documentation

## 2022-10-09 DIAGNOSIS — M19011 Primary osteoarthritis, right shoulder: Secondary | ICD-10-CM | POA: Insufficient documentation

## 2022-10-09 DIAGNOSIS — M75101 Unspecified rotator cuff tear or rupture of right shoulder, not specified as traumatic: Secondary | ICD-10-CM | POA: Diagnosis not present

## 2022-10-09 DIAGNOSIS — M75120 Complete rotator cuff tear or rupture of unspecified shoulder, not specified as traumatic: Secondary | ICD-10-CM | POA: Diagnosis not present

## 2022-10-09 HISTORY — PX: SHOULDER ARTHROSCOPY WITH ROTATOR CUFF REPAIR AND SUBACROMIAL DECOMPRESSION: SHX5686

## 2022-10-09 HISTORY — PX: SHOULDER ARTHROSCOPY WITH DISTAL CLAVICLE RESECTION: SHX5675

## 2022-10-09 LAB — GLUCOSE, CAPILLARY
Glucose-Capillary: 81 mg/dL (ref 70–99)
Glucose-Capillary: 111 mg/dL — ABNORMAL HIGH (ref 70–99)

## 2022-10-09 SURGERY — SHOULDER ARTHROSCOPY WITH DISTAL CLAVICLE RESECTION
Anesthesia: General | Site: Shoulder | Laterality: Right

## 2022-10-09 MED ORDER — CEFAZOLIN SODIUM-DEXTROSE 2-4 GM/100ML-% IV SOLN
2.0000 g | INTRAVENOUS | Status: AC
  Administered 2022-10-09: 2 g via INTRAVENOUS

## 2022-10-09 MED ORDER — KETOROLAC TROMETHAMINE 30 MG/ML IJ SOLN
INTRAMUSCULAR | Status: AC
  Filled 2022-10-09: qty 1

## 2022-10-09 MED ORDER — ONDANSETRON HCL 4 MG/2ML IJ SOLN
INTRAMUSCULAR | Status: AC
  Filled 2022-10-09: qty 2

## 2022-10-09 MED ORDER — ROCURONIUM BROMIDE 100 MG/10ML IV SOLN
INTRAVENOUS | Status: DC | PRN
  Administered 2022-10-09: 70 mg via INTRAVENOUS

## 2022-10-09 MED ORDER — SODIUM CHLORIDE 0.9 % IR SOLN
Status: DC | PRN
  Administered 2022-10-09: 6000 mL

## 2022-10-09 MED ORDER — PROPOFOL 10 MG/ML IV BOLUS
INTRAVENOUS | Status: AC
  Filled 2022-10-09: qty 20

## 2022-10-09 MED ORDER — SCOPOLAMINE 1 MG/3DAYS TD PT72
MEDICATED_PATCH | TRANSDERMAL | Status: AC
  Filled 2022-10-09: qty 1

## 2022-10-09 MED ORDER — FENTANYL CITRATE (PF) 100 MCG/2ML IJ SOLN
100.0000 ug | Freq: Once | INTRAMUSCULAR | Status: AC
  Administered 2022-10-09: 100 ug via INTRAVENOUS

## 2022-10-09 MED ORDER — ACETAMINOPHEN 500 MG PO TABS
ORAL_TABLET | ORAL | Status: AC
  Filled 2022-10-09: qty 2

## 2022-10-09 MED ORDER — FENTANYL CITRATE (PF) 100 MCG/2ML IJ SOLN
INTRAMUSCULAR | Status: AC
  Filled 2022-10-09: qty 2

## 2022-10-09 MED ORDER — SUGAMMADEX SODIUM 200 MG/2ML IV SOLN
INTRAVENOUS | Status: DC | PRN
  Administered 2022-10-09: 300 mg via INTRAVENOUS

## 2022-10-09 MED ORDER — DEXAMETHASONE SODIUM PHOSPHATE 10 MG/ML IJ SOLN
INTRAMUSCULAR | Status: AC
  Filled 2022-10-09: qty 1

## 2022-10-09 MED ORDER — MIDAZOLAM HCL 2 MG/2ML IJ SOLN
INTRAMUSCULAR | Status: AC
  Filled 2022-10-09: qty 2

## 2022-10-09 MED ORDER — KETOROLAC TROMETHAMINE 30 MG/ML IJ SOLN
INTRAMUSCULAR | Status: DC | PRN
  Administered 2022-10-09: 30 mg via INTRAVENOUS

## 2022-10-09 MED ORDER — PROPOFOL 10 MG/ML IV BOLUS
INTRAVENOUS | Status: DC | PRN
  Administered 2022-10-09: 180 mg via INTRAVENOUS

## 2022-10-09 MED ORDER — CEFAZOLIN SODIUM-DEXTROSE 2-4 GM/100ML-% IV SOLN
INTRAVENOUS | Status: AC
  Filled 2022-10-09: qty 100

## 2022-10-09 MED ORDER — BUPIVACAINE HCL (PF) 0.5 % IJ SOLN
INTRAMUSCULAR | Status: DC | PRN
  Administered 2022-10-09: 15 mL via PERINEURAL

## 2022-10-09 MED ORDER — ACETAMINOPHEN 500 MG PO TABS
1000.0000 mg | ORAL_TABLET | Freq: Once | ORAL | Status: AC
  Administered 2022-10-09: 1000 mg via ORAL

## 2022-10-09 MED ORDER — BUPIVACAINE LIPOSOME 1.3 % IJ SUSP
INTRAMUSCULAR | Status: DC | PRN
  Administered 2022-10-09: 10 mL via PERINEURAL

## 2022-10-09 MED ORDER — ROCURONIUM BROMIDE 10 MG/ML (PF) SYRINGE
PREFILLED_SYRINGE | INTRAVENOUS | Status: AC
  Filled 2022-10-09: qty 10

## 2022-10-09 MED ORDER — DEXAMETHASONE SODIUM PHOSPHATE 10 MG/ML IJ SOLN
INTRAMUSCULAR | Status: DC | PRN
  Administered 2022-10-09: 180 mg via INTRAVENOUS
  Administered 2022-10-09: 5 mg via INTRAVENOUS

## 2022-10-09 MED ORDER — LIDOCAINE 2% (20 MG/ML) 5 ML SYRINGE
INTRAMUSCULAR | Status: DC | PRN
  Administered 2022-10-09: 100 mg via INTRAVENOUS

## 2022-10-09 MED ORDER — ONDANSETRON HCL 4 MG/2ML IJ SOLN
INTRAMUSCULAR | Status: DC | PRN
  Administered 2022-10-09: 4 mg via INTRAVENOUS

## 2022-10-09 MED ORDER — HYDROCODONE-ACETAMINOPHEN 5-325 MG PO TABS: 1.0000 | ORAL_TABLET | ORAL | 0 refills | Status: DC | PRN

## 2022-10-09 MED ORDER — SCOPOLAMINE 1 MG/3DAYS TD PT72
1.0000 | MEDICATED_PATCH | TRANSDERMAL | Status: DC
  Administered 2022-10-09: 1.5 mg via TRANSDERMAL

## 2022-10-09 MED ORDER — FENTANYL CITRATE (PF) 100 MCG/2ML IJ SOLN
INTRAMUSCULAR | Status: DC | PRN
  Administered 2022-10-09: 50 ug via INTRAVENOUS

## 2022-10-09 MED ORDER — MIDAZOLAM HCL 2 MG/2ML IJ SOLN
2.0000 mg | Freq: Once | INTRAMUSCULAR | Status: AC
  Administered 2022-10-09: 2 mg via INTRAVENOUS

## 2022-10-09 MED ORDER — LACTATED RINGERS IV SOLN: INTRAVENOUS | Status: DC

## 2022-10-09 MED ORDER — ONDANSETRON HCL 4 MG PO TABS: 4.0000 mg | ORAL_TABLET | Freq: Three times a day (TID) | ORAL | 0 refills | Status: DC | PRN

## 2022-10-09 SURGICAL SUPPLY — 73 items
ANCH SUT FBRTAPE 1.3X2.6X1.7 (Anchor) ×1 IMPLANT
ANCHOR SUT FBRTK 2.6 SOFT 1.7 (Anchor) IMPLANT
ANCHOR SWIVELOCK SP KL 4.75 (Anchor) IMPLANT
BLADE EXCALIBUR 4.0X13 (MISCELLANEOUS) IMPLANT
BLADE SHAVER BONE 5.0X13 (MISCELLANEOUS) IMPLANT
BNDG CMPR 5X4 CHSV STRCH STRL (GAUZE/BANDAGES/DRESSINGS)
BNDG COHESIVE 4X5 TAN STRL LF (GAUZE/BANDAGES/DRESSINGS) IMPLANT
BUR SURG 4D 13L RD FLUTE (BUR) ×1 IMPLANT
BURR OVAL 8 FLU 4.0X13 (MISCELLANEOUS) IMPLANT
BURR SURG 4D 13L RD FLUTE (BUR) ×1
CANNULA 5.75X71 LONG (CANNULA) IMPLANT
CANNULA PASSPORT 5 (CANNULA) IMPLANT
CANNULA TWIST IN 8.25X7CM (CANNULA) IMPLANT
CUTTER BONE 4.0MM X 13CM (MISCELLANEOUS) IMPLANT
DISSECTOR  3.8MM X 13CM (MISCELLANEOUS)
DISSECTOR 3.8MM X 13CM (MISCELLANEOUS) IMPLANT
DISSECTOR 4.0MMX13CM CVD (MISCELLANEOUS) IMPLANT
DRAPE INCISE IOBAN 66X45 STRL (DRAPES) ×1 IMPLANT
DRAPE STERI 35X30 U-POUCH (DRAPES) ×1 IMPLANT
DRAPE SURG 17X23 STRL (DRAPES) ×1 IMPLANT
DRAPE U-SHAPE 47X51 STRL (DRAPES) ×1 IMPLANT
DRAPE U-SHAPE 76X120 STRL (DRAPES) ×2 IMPLANT
DURAPREP 26ML APPLICATOR (WOUND CARE) ×1 IMPLANT
GAUZE PAD ABD 8X10 STRL (GAUZE/BANDAGES/DRESSINGS) ×2 IMPLANT
GAUZE SPONGE 4X4 12PLY STRL (GAUZE/BANDAGES/DRESSINGS) ×1 IMPLANT
GAUZE XEROFORM 1X8 LF (GAUZE/BANDAGES/DRESSINGS) IMPLANT
GLOVE BIO SURGEON STRL SZ7.5 (GLOVE) ×2 IMPLANT
GLOVE BIOGEL PI IND STRL 8 (GLOVE) ×2 IMPLANT
GOWN STRL REUS W/ TWL LRG LVL3 (GOWN DISPOSABLE) ×1 IMPLANT
GOWN STRL REUS W/TWL LRG LVL3 (GOWN DISPOSABLE) ×1
GOWN STRL REUS W/TWL XL LVL3 (GOWN DISPOSABLE) ×2 IMPLANT
LASSO 90 CVE QUICKPAS (DISPOSABLE) IMPLANT
LASSO CRESCENT QUICKPASS (SUTURE) IMPLANT
LOOP 2 FIBERLINK CLOSED (SUTURE) IMPLANT
MANIFOLD NEPTUNE II (INSTRUMENTS) ×1 IMPLANT
NDL HD SCORPION MEGA LOADER (NEEDLE) IMPLANT
NDL SAFETY ECLIP 18X1.5 (MISCELLANEOUS) ×1 IMPLANT
PACK ARTHROSCOPY DSU (CUSTOM PROCEDURE TRAY) ×1 IMPLANT
PACK BASIN DAY SURGERY FS (CUSTOM PROCEDURE TRAY) ×1 IMPLANT
PAD COLD SHLDR WRAP-ON (PAD) ×1 IMPLANT
PAD ORTHO SHOULDER 7X19 LRG (SOFTGOODS) IMPLANT
PORT APPOLLO RF 90DEGREE MULTI (SURGICAL WAND) IMPLANT
PROBE APOLLO 90XL (SURGICAL WAND) IMPLANT
SHEET MEDIUM DRAPE 40X70 STRL (DRAPES) IMPLANT
SLEEVE ARM SUSPENSION SYSTEM (MISCELLANEOUS) IMPLANT
SLEEVE SCD COMPRESS KNEE MED (STOCKING) ×1 IMPLANT
SLING ARM FOAM STRAP LRG (SOFTGOODS) IMPLANT
SLING S3 LATERAL DISP (MISCELLANEOUS) IMPLANT
SLING ULTRA III MED (ORTHOPEDIC SUPPLIES) IMPLANT
SPIKE FLUID TRANSFER (MISCELLANEOUS) IMPLANT
SPONGE T-LAP 4X18 ~~LOC~~+RFID (SPONGE) IMPLANT
STRIP CLOSURE SKIN 1/2X4 (GAUZE/BANDAGES/DRESSINGS) ×1 IMPLANT
SUCTION FRAZIER HANDLE 10FR (MISCELLANEOUS)
SUCTION TUBE FRAZIER 10FR DISP (MISCELLANEOUS) IMPLANT
SUT 2 FIBERLOOP 20 STRT BLUE (SUTURE)
SUT ETHILON 3 0 PS 1 (SUTURE) IMPLANT
SUT FIBERWIRE #2 38 T-5 BLUE (SUTURE)
SUT MNCRL AB 3-0 PS2 27 (SUTURE) ×1 IMPLANT
SUT PDS AB 0 CT 36 (SUTURE) IMPLANT
SUT TIGER TAPE 7 IN WHITE (SUTURE) IMPLANT
SUT VIC AB 0 CT1 36 (SUTURE) IMPLANT
SUT VIC AB 2-0 CT1 27 (SUTURE)
SUT VIC AB 2-0 CT1 TAPERPNT 27 (SUTURE) IMPLANT
SUTURE 2 FIBERLOOP 20 STRT BLU (SUTURE) IMPLANT
SUTURE FIBERWR #2 38 T-5 BLUE (SUTURE) IMPLANT
SUTURE TAPE TIGERLINK 1.3MM BL (SUTURE) IMPLANT
SUTURETAPE TIGERLINK 1.3MM BL (SUTURE)
SYR 5ML LL (SYRINGE) ×1 IMPLANT
TAPE FIBER 2MM 7IN #2 BLUE (SUTURE) IMPLANT
TOWEL GREEN STERILE FF (TOWEL DISPOSABLE) ×1 IMPLANT
TUBE CONNECTING 20X1/4 (TUBING) ×1 IMPLANT
TUBING ARTHROSCOPY IRRIG 16FT (MISCELLANEOUS) ×1 IMPLANT
YANKAUER SUCT BULB TIP NO VENT (SUCTIONS) IMPLANT

## 2022-10-09 NOTE — Anesthesia Procedure Notes (Signed)
Anesthesia Regional Block: Interscalene brachial plexus block   Pre-Anesthetic Checklist: , timeout performed,  Correct Patient, Correct Site, Correct Laterality,  Correct Procedure, Correct Position, site marked,  Risks and benefits discussed,  Surgical consent,  Pre-op evaluation,  At surgeon's request and post-op pain management  Laterality: Right  Prep: chloraprep       Needles:  Injection technique: Single-shot  Needle Type: Echogenic Stimulator Needle     Needle Length: 5cm  Needle Gauge: 22     Additional Needles:   Narrative:  Start time: 10/09/2022 8:53 AM End time: 10/09/2022 9:03 AM Injection made incrementally with aspirations every 5 mL.  Performed by: Personally  Anesthesiologist: Heather Roberts, MD  Additional Notes: Functioning IV was confirmed and monitors applied.  A 11mm 22ga echogenic arrow stimulator was used. Sterile prep and drape,hand hygiene and sterile gloves were used.Ultrasound guidance: relevant anatomy identified, needle position confirmed, local anesthetic spread visualized around nerve(s)., vascular puncture avoided.  Image printed for medical record.  Negative aspiration and negative test dose prior to incremental administration of local anesthetic. The patient tolerated the procedure well.

## 2022-10-09 NOTE — Progress Notes (Signed)
Assisted Dr. Singer with right, interscalene , ultrasound guided block. Side rails up, monitors on throughout procedure. See vital signs in flow sheet. Tolerated Procedure well. 

## 2022-10-09 NOTE — Discharge Instructions (Addendum)
No Tylenol until 2:05 p.m. No ibuprofen until 5 p.m.  Orthopedic surgery discharge instructions:  -Maintain postoperative bandages for 3 days.  You may remove these bandages on post op day 3 and begin showering at that time.  Please do not submerge underwater.  -Maintain your arm in sling at all times.  You should only remove for showering and getting dressed.  No lifting with the operative arm.  -For mild to moderate pain use Tylenol and Advil in alternating fashion around-the-clock.  For breakthrough pain use pain meds as necessary.  -Please apply ice to the right shoulder for 20-30 minutes out of each hour that you are awake.  Do this around-the-clock for the first 3 days from surgery.  -Follow-up in 2 weeks for routine postoperative check. Regional Anesthesia Blocks  1. Numbness or the inability to move the "blocked" extremity may last from 3-48 hours after placement. The length of time depends on the medication injected and your individual response to the medication. If the numbness is not going away after 48 hours, call your surgeon.  2. The extremity that is blocked will need to be protected until the numbness is gone and the  Strength has returned. Because you cannot feel it, you will need to take extra care to avoid injury. Because it may be weak, you may have difficulty moving it or using it. You may not know what position it is in without looking at it while the block is in effect.  3. For blocks in the legs and feet, returning to weight bearing and walking needs to be done carefully. You will need to wait until the numbness is entirely gone and the strength has returned. You should be able to move your leg and foot normally before you try and bear weight or walk. You will need someone to be with you when you first try to ensure you do not fall and possibly risk injury.  4. Bruising and tenderness at the needle site are common side effects and will resolve in a few days.  5.  Persistent numbness or new problems with movement should be communicated to the surgeon or the [facility] [phone] [facility] [phone]). Post Anesthesia Home Care Instructions  Activity: Get plenty of rest for the remainder of the day. A responsible individual must stay with you for 24 hours following the procedure.  For the next 24 hours, DO NOT: -Drive a car -Advertising copywriter -Drink alcoholic beverages -Take any medication unless instructed by your physician -Make any legal decisions or sign important papers.  Meals: Start with liquid foods such as gelatin or soup. Progress to regular foods as tolerated. Avoid greasy, spicy, heavy foods. If nausea and/or vomiting occur, drink only clear liquids until the nausea and/or vomiting subsides. Call your physician if vomiting continues.  Special Instructions/Symptoms: Your throat may feel dry or sore from the anesthesia or the breathing tube placed in your throat during surgery. If this causes discomfort, gargle with warm salt water. The discomfort should disappear within 24 hours.  If you had a scopolamine patch placed behind your ear for the management of post- operative nausea and/or vomiting:  1. The medication in the patch is effective for 72 hours, after which it should be removed.  Wrap patch in a tissue and discard in the trash. Wash hands thoroughly with soap and water. 2. You may remove the patch earlier than 72 hours if you experience unpleasant side effects which may include dry mouth,  dizziness or visual disturbances. 3. Avoid touching the patch. Wash your hands with soap and water after contact with the patch.   Information for Discharge Teaching: EXPAREL (bupivacaine liposome injectable suspension)   Your surgeon or anesthesiologist gave you EXPAREL(bupivacaine) to help control your pain after surgery.  EXPAREL is a local anesthetic that provides pain relief by numbing the tissue around the  surgical site. EXPAREL is designed to release pain medication over time and can control pain for up to 72 hours. Depending on how you respond to EXPAREL, you may require less pain medication during your recovery.  Possible side effects: Temporary loss of sensation or ability to move in the area where bupivacaine was injected. Nausea, vomiting, constipation Rarely, numbness and tingling in your mouth or lips, lightheadedness, or anxiety may occur. Call your doctor right away if you think you may be experiencing any of these sensations, or if you have other questions regarding possible side effects.  Follow all other discharge instructions given to you by your surgeon or nurse. Eat a healthy diet and drink plenty of water or other fluids.  If you return to the hospital for any reason within 96 hours following the administration of EXPAREL, it is important for health care providers to know that you have received this anesthetic. A teal colored band has been placed on your arm with the date, time and amount of EXPAREL you have received in order to alert and inform your health care providers. Please leave this armband in place for the full 96 hours following administration, and then you may remove the band. [facility] Ultrasling III (Red ball):  Please contact your surgeon if you have questions or concerns about your sling.

## 2022-10-09 NOTE — Anesthesia Preprocedure Evaluation (Signed)
Anesthesia Evaluation  Patient identified by MRN, date of birth, ID band Patient awake    Reviewed: Allergy & Precautions, NPO status , Patient's Chart, lab work & pertinent test results  History of Anesthesia Complications Negative for: history of anesthetic complications  Airway Mallampati: II  TM Distance: >3 FB Neck ROM: Full    Dental no notable dental hx. (+) Dental Advisory Given   Pulmonary former smoker   Pulmonary exam normal        Cardiovascular hypertension, Pt. on medications Normal cardiovascular exam     Neuro/Psych  Headaches  Anxiety Depression    negative neurological ROS  negative psych ROS   GI/Hepatic Neg liver ROS,GERD  Medicated,,  Endo/Other  diabetes, Type 2    Renal/GU negative Renal ROS  negative genitourinary   Musculoskeletal negative musculoskeletal ROS (+) Arthritis ,    Abdominal   Peds negative pediatric ROS (+)  Hematology negative hematology ROS (+)   Anesthesia Other Findings   Reproductive/Obstetrics negative OB ROS                             Anesthesia Physical Anesthesia Plan  ASA: 3  Anesthesia Plan: General   Post-op Pain Management: Regional block*, Tylenol PO (pre-op)* and Toradol IV (intra-op)*   Induction: Intravenous  PONV Risk Score and Plan: 3 and Treatment may vary due to age or medical condition, Ondansetron, Dexamethasone and Midazolam  Airway Management Planned: Oral ETT  Additional Equipment: None  Intra-op Plan:   Post-operative Plan: Extubation in OR  Informed Consent: I have reviewed the patients History and Physical, chart, labs and discussed the procedure including the risks, benefits and alternatives for the proposed anesthesia with the patient or authorized representative who has indicated his/her understanding and acceptance.     Dental advisory given  Plan Discussed with: Anesthesiologist, CRNA and  Surgeon  Anesthesia Plan Comments:         Anesthesia Quick Evaluation

## 2022-10-09 NOTE — Transfer of Care (Signed)
Immediate Anesthesia Transfer of Care Note  Patient: [name] [name]  Procedure(s) Performed: SHOULDER ARTHROSCOPY WITH DISTAL CLAVICLE RESECTION (Right: Shoulder) SHOULDER ARTHROSCOPY WITH ROTATOR CUFF REPAIR AND SUBACROMIAL DECOMPRESSION (Right: Shoulder)  Patient Location: PACU  Anesthesia Type:GA combined with regional for post-op pain  Level of Consciousness: drowsy  Airway & Oxygen Therapy: Patient Spontanous Breathing and Patient connected to face mask oxygen  Post-op Assessment: Report given to RN and Post -op Vital signs reviewed and stable  Post vital signs: Reviewed and stable  Last Vitals:  Vitals Value Taken Time  BP 141/98 10/09/22 1146  Temp    Pulse 99 10/09/22 1149  Resp 20 10/09/22 1149  SpO2 99 % 10/09/22 1149  Vitals shown include unvalidated device data.  Last Pain:  Vitals:   10/09/22 0758  TempSrc: Oral  PainSc: 2          Complications: No notable events documented.

## 2022-10-09 NOTE — H&P (Signed)
ORTHOPAEDIC H and P  REQUESTING PHYSICIAN: Yolonda Kida, MD  PCP:  Tommie Sams, DO  Chief Complaint: Right shoulder pain  HPI: [name] [name] is a 62 y.o. female who complains of right shoulder pain and weakness.  She is been dealing with this for a number of months.  She has had failure of improvement with conservative treatment and is opted for arthroscopic rotator cuff repair based on preoperative MRI.  No new complaints today.  Past Medical History:  Diagnosis Date   Allergic rhinitis    Allergy    Anemia, unspecified 05/30/2013   Anxiety    Breast cancer 03/31/2013   left   Cancer    Chest pain 2009   Consultation-[facility], negative chest CT; nl echo in 2005; h/o palpitations   Chiari malformation type I    s/p suboccipital craniectomy, C1 laminectomy, superior C2 laminectomy, duraplasty 05/10/14   Colitis 2010   not IBD   Degenerative joint disease    + degenerative joint disease of the lumbosacral spine   Depression    Diabetes    Diabetes    Dysrhythmia    palpations   Genetic testing 07/01/2016   GERD (gastroesophageal reflux disease)    "a little"   Headache    Heart murmur    "small"   Herpes simplex type II infection    Hypercholesteremia    "slightly high"   Hypertension    does not have high blood pressure   Meningitis 08/22/2014   Palpitations    Personal history of chemotherapy    Personal history of radiation therapy    Urticaria    Wears glasses    Past Surgical History:  Procedure Laterality Date   ABDOMINAL HYSTERECTOMY  03/13/2007   TAH ?BSO--Dr Cheron Every, [place]   BIOPSY  01/14/2021   Procedure: BIOPSY;  Surgeon: Lanelle Bal, DO;  Location: [facility];  Service: Endoscopy;;   BRAIN SURGERY     BREAST EXCISIONAL BIOPSY  04/2003   Left; benign disease   BREAST LUMPECTOMY Left 09/2013   BREAST LUMPECTOMY WITH NEEDLE LOCALIZATION AND AXILLARY LYMPH NODE DISSECTION Left 09/12/2013   Procedure: BREAST  LUMPECTOMY WITH NEEDLE LOCALIZATION AND AXILLARY LYMPH NODE DISSECTION;  Surgeon: Kandis Cocking, MD;  Location: [facility];  Service: General;  Laterality: Left;  and axilla   BREAST SURGERY     COLONOSCOPY  10/2010   proctitis; melanosis coli   COLONOSCOPY WITH PROPOFOL N/A 01/04/2018   Dr. Darrick Penna: hemorrhoids, follow up colonoscopy in 10 years   EAR CYST EXCISION Right 09/04/2019   Procedure: EXCISION EAR MASS;  Surgeon: Newman Pies, MD;  Location: [facility];  Service: ENT;  Laterality: Right;   ESOPHAGOGASTRODUODENOSCOPY (EGD) WITH PROPOFOL N/A 01/14/2021   Procedure: ESOPHAGOGASTRODUODENOSCOPY (EGD) WITH PROPOFOL;  Surgeon: Lanelle Bal, DO;  Location: [facility];  Service: Endoscopy;  Laterality: N/A;  9:00am   EXCISION/RELEASE BURSA HIP Left 03/17/2019   Procedure: Left hip open bursectomy;  Surgeon: Yolonda Kida, MD;  [facility];  Service: Orthopedics;  Laterality: Left;  75 mins   EYE SURGERY Left    "fix lazy eye"   PORTACATH PLACEMENT Right 04/21/2013   Procedure: INSERTION PORT-A-CATH;  Surgeon: Kandis Cocking, MD;  Location: [facility];  Service: General;  Laterality: Right;   RIGHT OOPHORECTOMY  03/13/2007   SUBOCCIPITAL CRANIECTOMY CERVICAL LAMINECTOMY N/A 05/10/2014   Procedure: SUBOCCIPITAL CRANIECTOMY CERVICAL LAMINECTOMY/DURAPLASTY;  Surgeon: Hewitt Shorts, MD;  Location: [facility];  Service: Neurosurgery;  Laterality: N/A;  suboccipital craniectomy with cervical laminectomy and duraplasty   TUBAL LIGATION     Social History   Socioeconomic History   Marital status: Married    Spouse name: Peyton NajjarLarry   Number of children: 4   Years of education: Not on file   Highest education level: Not on file  Occupational History   Occupation: Factory work    Associate Professormployer: Nurse, [employer]  Tobacco Use   Smoking status: Former    Packs/day: 0.50    Years: 15.00    Additional pack years: 0.00    Total pack years: 7.50    Types:  Cigarettes    Quit date: 07/06/1982    Years since quitting: 40.2   Smokeless tobacco: Never  Vaping Use   Vaping Use: Never used  Substance and Sexual Activity   Alcohol use: No   Drug use: No   Sexual activity: Yes    Partners: Male    Birth control/protection: Surgical    Comment: TAH  Other Topics Concern   Not on file  Social History Narrative   4 biological children   4 step children   13 grandchildren    1 great grandchildren   2 foster children   Social Determinants of Health   Financial Resource Strain: Low Risk  (07/24/2022)   Overall Financial Resource Strain (CARDIA)    Difficulty of Paying Living Expenses: Not hard at all  Food Insecurity: Food Insecurity Present (07/24/2022)   Hunger Vital Sign    Worried About Running Out of Food in the Last Year: Sometimes true    Ran Out of Food in the Last Year: Sometimes true  Transportation Needs: No Transportation Needs (07/24/2022)   PRAPARE - Administrator, Civil ServiceTransportation    Lack of Transportation (Medical): No    Lack of Transportation (Non-Medical): No  Physical Activity: Inactive (07/24/2022)   Exercise Vital Sign    Days of Exercise per Week: 0 days    Minutes of Exercise per Session: 0 min  Stress: No Stress Concern Present (07/24/2022)   [employer] - Occupational Stress Questionnaire    Feeling of Stress : Only a little  Social Connections: Socially Integrated (07/24/2022)   Social Connection and Isolation Panel [NHANES]    Frequency of Communication with Friends and Family: More than three times a week    Frequency of Social Gatherings with Friends and Family: Once a week    Attends Religious Services: More than 4 times per year    Active Member of [employer] or Organizations: Yes    Attends BankerClub or Organization Meetings: Never    Marital Status: Married   Family History  Problem Relation Age of Onset   Aneurysm Mother        Cerebral   Hypertension Mother    Hyperlipidemia Mother    Stroke  Mother    Depression Mother    Coronary artery disease Father    Hypertension Father    Hyperlipidemia Father    Heart attack Father        d. 3563   Lung cancer Father        dx early 3860s; former smoker   Lung cancer Sister        paternal half-sister; not a smoker; dx <60; d. 60y   Breast cancer Sister [id number]   Other Sister        hx of hysterectomy for unspecified reason   Eczema Brother  Lung cancer Brother    Lung cancer Brother        d. 49y; smoker   Multiple myeloma Brother        d. 35s   Lung cancer Brother        dx late 98s; smoker   Arthritis Brother    Prostate cancer Brother        dx late 34s   Prostate cancer Brother        dx late 76s   Lung cancer Brother    Diabetes Brother    Leukemia Maternal Aunt        "blood cancer"; d. unspecified age   Lung cancer Maternal Aunt        d. unspecified age   Breast cancer Maternal Aunt        dx unspecified age   Cervical cancer Paternal Aunt        d. late 7s   Anxiety disorder Daughter    Prostate cancer Cousin        maternal 1st cousin; dx older age   Breast cancer Other        niece dx early 79s or before   Leukemia Other        nephew d. 2y; "blood cancer"   Colon cancer Neg Hx    Allergies  Allergen Reactions   Doxycycline Itching and Swelling   Prior to Admission medications   Medication Sig Start Date End Date Taking? Authorizing Provider  atenolol (TENORMIN) 50 MG tablet Take 1 tablet (50 mg total) by mouth daily. 11/24/21  Yes Ameduite, Alvino Chapel, FNP  diphenhydramine-acetaminophen (TYLENOL PM) 25-500 MG TABS tablet Take 1 tablet by mouth at bedtime as needed (sleep).   Yes [provider]  escitalopram (LEXAPRO) 20 MG tablet TAKE 1 TABLET BY MOUTH EVERY DAY 05/26/22  Yes Ameduite, Alvino Chapel, FNP  famotidine (PEPCID) 40 MG tablet TAKE 1 TABLET BY MOUTH EVERYDAY AT BEDTIME 09/11/22  Yes Carver, Charles K, DO  fluticasone (FLONASE) 50 MCG/ACT nasal spray Place 2 sprays into both nostrils daily.  06/23/22  Yes Cook, Jayce G, DO  glucose blood (ONETOUCH ULTRA) test strip USE TO TEST BLOOD SUGAR 4 TIMES A DAY 08/19/22   Tommie Sams, DO  levocetirizine (XYZAL) 5 MG tablet Take 5 mg by mouth every evening. 08/29/20  Yes [provider]  Melatonin 10 MG TABS Take 10 mg by mouth at bedtime.   Yes [provider]  pantoprazole (PROTONIX) 40 MG tablet TAKE 1 TABLET (40 MG TOTAL) BY MOUTH 2 (TWO) TIMES DAILY BEFORE A MEAL. 02/16/22 02/16/23 Yes Carver, Hennie Duos, DO  rosuvastatin (CRESTOR) 20 MG tablet Take 1 tablet (20 mg total) by mouth daily. 03/25/22  Yes Ameduite, Alvino Chapel, FNP  Semaglutide,0.25 or 0.5MG /DOS, (OZEMPIC, 0.25 OR 0.5 MG/DOSE,) 2 MG/3ML SOPN Inject 0.5 mg into the skin once a week. 07/09/22  Yes Cook, Jayce G, DO  valACYclovir (VALTREX) 500 MG tablet Take 1 tablet (500 mg total) by mouth daily. 03/05/22  Yes Ameduite, Alvino Chapel, FNP  Blood Glucose Monitoring Suppl (ONE TOUCH ULTRA 2) w/Device KIT DISPENSE BASED ON PATIENT AND INSURANCE PREFERENCE. USE UP TO FOUR TIMES DAILY AS DIRECTED. 08/19/22   Everlene Other G, DO  EPINEPHrine 0.3 mg/0.3 mL IJ SOAJ injection Inject 0.3 mg into the muscle as needed for anaphylaxis.  04/07/17   [provider]  Insulin Pen Needle (PEN NEEDLES 5/16") 30G X 8 MM MISC Use with victoza 08/29/21   Ameduite,  Alvino Chapel, FNP  Lancets (ONETOUCH DELICA PLUS LANCET30G) MISC USE TO TEST BLOOD SUGAR 4 TIMES A DAY 08/26/22   Cook, Jayce G, DO  LORazepam (ATIVAN) 0.5 MG tablet Take 1 tablet (0.5 mg total) by mouth at bedtime as needed for sleep. 11/04/21   Ameduite, Alvino Chapel, FNP  methocarbamol (ROBAXIN) 500 MG tablet Take 500 mg by mouth daily as needed for muscle spasms.    [provider]  ondansetron (ZOFRAN-ODT) 4 MG disintegrating tablet Take 1 tablet (4 mg total) by mouth every 8 (eight) hours as needed for nausea or vomiting. 11/04/21   Ameduite, Alvino Chapel, FNP  potassium chloride SA (K-DUR,KLOR-CON) 20 MEQ tablet Take 1 tablet (20 mEq  total) by mouth 2 (two) times daily. 07/31/13 08/07/13  Annamarie Dawley, MD   No results found.  Positive ROS: All other systems have been reviewed and were otherwise negative with the exception of those mentioned in the HPI and as above.  Physical Exam: General: Alert, no acute distress Cardiovascular: No pedal edema Respiratory: No cyanosis, no use of accessory musculature GI: No organomegaly, abdomen is soft and non-tender Skin: No lesions in the area of chief complaint Neurologic: Sensation intact distally Psychiatric: Patient is competent for consent with normal mood and affect Lymphatic: No axillary or cervical lymphadenopathy  MUSCULOSKELETAL: Right upper extremity is warm and well-perfused.  Assessment: 1.  Right shoulder complete rotator cuff tear  2.  Right shoulder subacromial impingement  3.  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  We discussed arthroscopic rotator cuff repair with extensive debridement and biceps tenotomy with subacromial decompression and distal clavicle resection based on preoperative examination and history.  Discussed the risk of bleeding, infection, damage to surrounding nerves and vessels, stiffness, failure of repairs, persistent pain, need for revision surgery, as well as the risk of anesthesia.  She has provided informed consent.  - dc home today from pacu    Yolonda Kida, MD Cell [phone]    10/09/2022 7:21 AM

## 2022-10-09 NOTE — Brief Op Note (Signed)
10/09/2022  11:37 AM  PATIENT:  [name] [name]  62 y.o. female  PRE-OPERATIVE DIAGNOSIS:  Right shoulder impingement, acromioclavicular osteoarthritis, rotator cuff tear  POST-OPERATIVE DIAGNOSIS:  Right shoulder impingement, acromioclavicular osteoarthritis, rotator cuff tear  PROCEDURE:  Procedure(s) with comments: SHOULDER ARTHROSCOPY WITH DISTAL CLAVICLE RESECTION (Right) - 120 SHOULDER ARTHROSCOPY WITH ROTATOR CUFF REPAIR AND SUBACROMIAL DECOMPRESSION (Right)  SURGEON:  Surgeon(s) and Role:    * Yolonda Kida, MD - Primary  PHYSICIAN ASSISTANT: Dion Saucier, PA-C  ANESTHESIA:   regional and general  EBL:  25 mL   BLOOD ADMINISTERED:none  DRAINS: none   LOCAL MEDICATIONS USED:  NONE  SPECIMEN:  No Specimen  DISPOSITION OF SPECIMEN:  N/A  COUNTS:  YES  TOURNIQUET:  * No tourniquets in log *  DICTATION: .Note written in EPIC  PLAN OF CARE: Discharge to home after PACU  PATIENT DISPOSITION:  PACU - hemodynamically stable.   Delay start of Pharmacological VTE agent (>24hrs) due to surgical blood loss or risk of bleeding: not applicable

## 2022-10-09 NOTE — Op Note (Signed)
Date of Surgery: 10/09/2022  INDICATIONS: Ms. [name] [name] is a 62 y.o.-year-old female with a right full-thickness acute rotator cuff tear with symptomatic AC joint arthropathy;  The patient did consent to the procedure after discussion of the risks and benefits.  PREOPERATIVE DIAGNOSIS:  1.  Right shoulder full-thickness acute rotator cuff tear 2.  Right shoulder subacromial impingement 3.  Right shoulder acromioclavicular joint arthritis  POSTOPERATIVE DIAGNOSIS:  1.  Right shoulder full-thickness acute rotator cuff tear 2.  Right shoulder subacromial impingement 3.  Right shoulder acromioclavicular joint arthritis 4.  Right shoulder degenerative labral tearing anterior, superior 5.  Right shoulder long head of biceps tendinitis  PROCEDURE:  1.  Right shoulder arthroscopic extensive debridement of rotator interval, anterior labrum, superior labrum, and biceps tenotomy 2.  Right shoulder arthroscopic rotator cuff repair 3.  Right shoulder arthroscopic subacromial decompression with coracoacromial ligament release 4.  Right shoulder arthroscopic distal clavicle resection.  SURGEON: Maryan Rued, M.D.  ASSIST: Dion Saucier, PA-C  Assistant attestation:  PA Mcclung was present for the entire procedure..  ANESTHESIA:  general, interscalene block with Exparel  IV FLUIDS AND URINE: See anesthesia.  ESTIMATED BLOOD LOSS: 25 mL.  IMPLANTS: Arthrex 2.6 mm fiber tack rotator cuff anchor x 1 Arthrex 4.75 mm swivel lock self punching anchor x 2  DRAINS: None  COMPLICATIONS: None.  DESCRIPTION OF PROCEDURE: The patient was brought to the operating room and placed supine on the operating table.  The patient had been signed prior to the procedure and this was documented. The patient had the anesthesia placed by the anesthesiologist.  A time-out was performed to confirm that this was the correct patient, site, side and location. The patient did receive antibiotics prior to the incision and  was re-dosed during the procedure as needed at indicated intervals.  A tourniquet was not placed.  The patient had the operative extremity prepped and draped in the standard surgical fashion.      After obtaining informed consent the patient was brought to the operating table and underwent satisfactory anesthesia. An exam under anesthesia revealed full range of motion.  She was placed in the left lateral decubitus position with an axillary roll and all bony prominences properly padded. A standard surgical timeout was performed. He was placed in 10 pounds of gentle in-line suspension.  Standard posterior and anterior superior portals were established. A diagnostic evaluation of the glenohumeral joint was performed.   Findings from the diagnostic arthroscopy would include torn long head of biceps tendon with significant tenosynovitis, unstable type II SLAP tear with propagation of the tear anterior labrum as well as posterior labrum.  No glenohumeral osteoarthritis and no loose bodies.  The teres minor and infraspinatus were completely intact.  Subscapularis was intact with some small areas of upper border split tears.  The supraspinatus was torn and a crescent shaped tear pattern.  The biceps tendon was markedly synovitic and partially torn. It was tenotomized. An extensive debridement was performed of the superior anterior and posterior labrum.  Motorized shaver was also used to completely debride the rotator interval.  The articular surfaces revealed no significant chondromalacia . The subscapularis was intact. The rotator cuff was torn and the greater tuberosity footprint was prepared for repair. Releases were performed on the capsular surface of the rotator cuff.  The arthroscope was inserted in the subacromial space and an additional lateral portal was established.  Next, an acromioplasty performed nicely decompressing the subacromial space with a motorized burr.   We then  moved ahead with our  arthroscopic rotator cuff repair.  Bursitis in the subacromial space was removed as well as releases were performed on the bursal surface of the rotator cuff.  We performed bursal sided releases and checked the mobility of the rotator cuff.  We did note that there was some delamination of this tear.  This was a crescent shaped tear that measured 2 cm from anterior to posterior and 1.5 cm from lateral to medial.  We did release the undersurface portion such that the tendon would move as a single unit.  We then were satisfied with the mobility.  Next, we prepared the greater tuberosity with motorized shaver.  We gently decorticated the greater tuberosity.  We then placed our 2.6 mm knotless fiber tack anchor at the articular margin on the medial aspect of the footprint.  This was preloaded with a single suture tape as well as a fiber tapes wedged together.  We passed these independently and had 4 suture limbs to work with at the myotendinous junction.  We then loaded these into our lateral row anchors.  We utilized 2 separate 4.75 mm self punching swivel lock anchors with knotless mechanism.  We had good compression at the bone tendon interface and there were no dogears.   Lastly, we performed our distal clavicle resection.  While viewing from lateral portal and working from the anterior portal we used the radiofrequency wand to perform subperiosteal dissection.  We noted a sclerotic and edematous distal clavicle.  We then introduced the motorized bur and resected approximately 1 cm of the distal clavicle.   The arthroscope was then removed and portals closed with 4-0 Monocryl in standard fashion followed by a sterile occlusive dressing Polar Care ice sleeve and a slingshot sling. The patient was sent to recovery in stable condition and tolerated the procedure well  POSTOPERATIVE PLAN:  Ms. Porten will be nonweightbearing to the right upper extremity.  She will maintain her sling at all times unless  showering or getting dressed.  She will follow-up with me in the office in 2 weeks for standard postoperative care.  She will be on the less than 3 cm rotator cuff repair protocol.

## 2022-10-09 NOTE — Anesthesia Procedure Notes (Signed)
Procedure Name: Intubation Date/Time: 10/09/2022 10:11 AM  Performed by: Lauralyn Primes, CRNAPre-anesthesia Checklist: Patient identified, Emergency Drugs available, Suction available and Patient being monitored Patient Re-evaluated:Patient Re-evaluated prior to induction Oxygen Delivery Method: Circle system utilized Preoxygenation: Pre-oxygenation with 100% oxygen Induction Type: IV induction Ventilation: Mask ventilation without difficulty Laryngoscope Size: Mac and 3 Grade View: Grade II Tube type: Oral Tube size: 7.0 mm Number of attempts: 1 Airway Equipment and Method: Stylet and Bite block Placement Confirmation: ETT inserted through vocal cords under direct vision, positive ETCO2 and breath sounds checked- equal and bilateral Tube secured with: Tape Dental Injury: Teeth and Oropharynx as per pre-operative assessment

## 2022-10-12 ENCOUNTER — Encounter (HOSPITAL_BASED_OUTPATIENT_CLINIC_OR_DEPARTMENT_OTHER): Payer: Self-pay | Admitting: Orthopedic Surgery

## 2022-10-12 NOTE — Anesthesia Postprocedure Evaluation (Signed)
Anesthesia Post Note  Patient: [name] [name]  Procedure(s) Performed: SHOULDER ARTHROSCOPY WITH DISTAL CLAVICLE RESECTION (Right: Shoulder) SHOULDER ARTHROSCOPY WITH ROTATOR CUFF REPAIR AND SUBACROMIAL DECOMPRESSION (Right: Shoulder)     Patient location during evaluation: PACU Anesthesia Type: General Level of consciousness: sedated Pain management: pain level controlled Vital Signs Assessment: post-procedure vital signs reviewed and stable Respiratory status: spontaneous breathing and respiratory function stable Cardiovascular status: stable Postop Assessment: no apparent nausea or vomiting Anesthetic complications: no   No notable events documented.  Last Vitals:  Vitals:   10/09/22 1243 10/09/22 1249  BP: 114/77 118/74  Pulse: 76 76  Resp: 18 16  Temp: 36.6 C   SpO2: 96% 94%    Last Pain:  Vitals:   10/12/22 0954  TempSrc:   PainSc: 0-No pain                 Seger Jani DANIEL

## 2022-10-13 DIAGNOSIS — E119 Type 2 diabetes mellitus without complications: Secondary | ICD-10-CM | POA: Diagnosis not present

## 2022-10-13 DIAGNOSIS — H25813 Combined forms of age-related cataract, bilateral: Secondary | ICD-10-CM | POA: Diagnosis not present

## 2022-10-13 DIAGNOSIS — H35363 Drusen (degenerative) of macula, bilateral: Secondary | ICD-10-CM | POA: Diagnosis not present

## 2022-10-16 ENCOUNTER — Ambulatory Visit (INDEPENDENT_AMBULATORY_CARE_PROVIDER_SITE_OTHER): Payer: HMO

## 2022-10-16 DIAGNOSIS — M25511 Pain in right shoulder: Secondary | ICD-10-CM | POA: Diagnosis not present

## 2022-10-16 DIAGNOSIS — M25611 Stiffness of right shoulder, not elsewhere classified: Secondary | ICD-10-CM | POA: Diagnosis not present

## 2022-10-16 DIAGNOSIS — J309 Allergic rhinitis, unspecified: Secondary | ICD-10-CM | POA: Diagnosis not present

## 2022-10-19 ENCOUNTER — Other Ambulatory Visit: Payer: Self-pay | Admitting: Family Medicine

## 2022-10-19 ENCOUNTER — Telehealth: Payer: Self-pay | Admitting: Family Medicine

## 2022-10-19 NOTE — Telephone Encounter (Signed)
Prescription Request  10/19/2022  LOV: 07/27/2022  What is the name of the medication or equipment? valACYclovir (VALTREX) 500 MG tablet   Have you contacted your pharmacy to request a refill? Yes   Which pharmacy would you like this sent to?  [id number] - [place], [place] - [address] AT [facility] [address] [place] [address] Phone: [phone] Fax: [phone]    Patient notified that their request is being sent to the clinical staff for review and that they should receive a response within 2 business days.   Please advise at [facility] [phone]

## 2022-10-20 ENCOUNTER — Other Ambulatory Visit: Payer: Self-pay | Admitting: Family Medicine

## 2022-10-20 DIAGNOSIS — N3090 Cystitis, unspecified without hematuria: Secondary | ICD-10-CM

## 2022-10-20 DIAGNOSIS — M25511 Pain in right shoulder: Secondary | ICD-10-CM | POA: Diagnosis not present

## 2022-10-20 DIAGNOSIS — M25611 Stiffness of right shoulder, not elsewhere classified: Secondary | ICD-10-CM | POA: Diagnosis not present

## 2022-10-20 MED ORDER — VALACYCLOVIR HCL 500 MG PO TABS: 500.0000 mg | ORAL_TABLET | Freq: Every day | ORAL | 3 refills | Status: DC

## 2022-10-20 NOTE — Telephone Encounter (Signed)
Cook, Jayce G, DO     Rx sent.   

## 2022-10-20 NOTE — Telephone Encounter (Signed)
Received via fax Rx request: Prescription sent electronically to pharmacy by provider 

## 2022-10-23 ENCOUNTER — Ambulatory Visit (INDEPENDENT_AMBULATORY_CARE_PROVIDER_SITE_OTHER): Payer: HMO

## 2022-10-23 DIAGNOSIS — J309 Allergic rhinitis, unspecified: Secondary | ICD-10-CM

## 2022-10-26 ENCOUNTER — Ambulatory Visit (INDEPENDENT_AMBULATORY_CARE_PROVIDER_SITE_OTHER): Payer: HMO | Admitting: Obstetrics & Gynecology

## 2022-10-26 ENCOUNTER — Encounter: Payer: Self-pay | Admitting: Obstetrics & Gynecology

## 2022-10-26 VITALS — BP 133/72 | HR 58

## 2022-10-26 DIAGNOSIS — R102 Pelvic and perineal pain: Secondary | ICD-10-CM

## 2022-10-26 NOTE — Progress Notes (Signed)
No chief complaint on file.     62 y.o. [id number] Patient's last menstrual period was 12/04/2008. The current method of family planning is status post hysterectomy.  Outpatient Encounter Medications as of 10/26/2022  Medication Sig Note   atenolol (TENORMIN) 50 MG tablet Take 1 tablet (50 mg total) by mouth daily.    Blood Glucose Monitoring Suppl (ONE TOUCH ULTRA 2) w/Device KIT DISPENSE BASED ON PATIENT AND INSURANCE PREFERENCE. USE UP TO FOUR TIMES DAILY AS DIRECTED.    diphenhydramine-acetaminophen (TYLENOL PM) 25-500 MG TABS tablet Take 1 tablet by mouth at bedtime as needed (sleep).    EPINEPHrine 0.3 mg/0.3 mL IJ SOAJ injection Inject 0.3 mg into the muscle as needed for anaphylaxis.  10/22/2021: If needed   escitalopram (LEXAPRO) 20 MG tablet TAKE 1 TABLET BY MOUTH EVERY DAY    famotidine (PEPCID) 40 MG tablet TAKE 1 TABLET BY MOUTH EVERYDAY AT BEDTIME    fluticasone (FLONASE) 50 MCG/ACT nasal spray Place 2 sprays into both nostrils daily.    glucose blood (ONETOUCH ULTRA) test strip USE TO TEST BLOOD SUGAR 4 TIMES A DAY    Insulin Pen Needle (PEN NEEDLES 5/16") 30G X 8 MM MISC Use with victoza    Lancets (ONETOUCH DELICA PLUS LANCET30G) MISC USE TO TEST BLOOD SUGAR 4 TIMES A DAY    levocetirizine (XYZAL) 5 MG tablet Take 5 mg by mouth every evening.    Melatonin 10 MG TABS Take 10 mg by mouth at bedtime.    methocarbamol (ROBAXIN) 500 MG tablet Take 500 mg by mouth daily as needed for muscle spasms.    pantoprazole (PROTONIX) 40 MG tablet TAKE 1 TABLET (40 MG TOTAL) BY MOUTH 2 (TWO) TIMES DAILY BEFORE A MEAL.    rosuvastatin (CRESTOR) 20 MG tablet Take 1 tablet (20 mg total) by mouth daily.    Semaglutide,0.25 or 0.5MG /DOS, (OZEMPIC, 0.25 OR 0.5 MG/DOSE,) 2 MG/3ML SOPN INJECT 0.5 MG INTO THE SKIN ONE TIME PER WEEK    valACYclovir (VALTREX) 500 MG tablet Take 1 tablet (500 mg total) by mouth daily.    HYDROcodone-acetaminophen (NORCO) 5-325 MG tablet Take 1 tablet by mouth  every 4 (four) hours as needed for moderate pain or severe pain. (Patient not taking: Reported on 10/26/2022)    LORazepam (ATIVAN) 0.5 MG tablet Take 1 tablet (0.5 mg total) by mouth at bedtime as needed for sleep. (Patient not taking: Reported on 10/26/2022)    ondansetron (ZOFRAN) 4 MG tablet Take 1 tablet (4 mg total) by mouth every 8 (eight) hours as needed for nausea or vomiting. (Patient not taking: Reported on 10/26/2022)    ondansetron (ZOFRAN-ODT) 4 MG disintegrating tablet Take 1 tablet (4 mg total) by mouth every 8 (eight) hours as needed for nausea or vomiting. (Patient not taking: Reported on 10/26/2022)    [DISCONTINUED] potassium chloride SA (K-DUR,KLOR-CON) 20 MEQ tablet Take 1 tablet (20 mEq total) by mouth 2 (two) times daily.    No facility-administered encounter medications on file as of 10/26/2022.    Subjective Pt has 1-2 minutes of a cramping sensation every few days Not associated with constipation No urinary symptoms Had a hysterectomy uncomplicated Has had intermittent feeling of air through vagina for years Multiple exams and CT scans have all been negative, seen at [facility] also negative Not associated with movement  Past Medical History:  Diagnosis Date   Allergic rhinitis    Allergy    Anemia, unspecified 05/30/2013   Anxiety  Breast cancer 03/31/2013   left   Cancer    Chest pain 2009   Consultation-[facility], negative chest CT; nl echo in 2005; h/o palpitations   Chiari malformation type I    s/p suboccipital craniectomy, C1 laminectomy, superior C2 laminectomy, duraplasty 05/10/14   Colitis 2010   not IBD   Degenerative joint disease    + degenerative joint disease of the lumbosacral spine   Depression    Diabetes    Diabetes    Dysrhythmia    palpations   Genetic testing 07/01/2016   GERD (gastroesophageal reflux disease)    "a little"   Headache    Heart murmur    "small"   Herpes simplex type II infection    Hypercholesteremia    "slightly  high"   Hypertension    does not have high blood pressure   Meningitis 08/22/2014   Palpitations    Personal history of chemotherapy    Personal history of radiation therapy    Urticaria    Wears glasses     Past Surgical History:  Procedure Laterality Date   ABDOMINAL HYSTERECTOMY  03/13/2007   TAH ?BSO--Dr Cheron Every, [place]   BIOPSY  01/14/2021   Procedure: BIOPSY;  Surgeon: Lanelle Bal, DO;  Location: [facility];  Service: Endoscopy;;   BRAIN SURGERY     BREAST EXCISIONAL BIOPSY  04/2003   Left; benign disease   BREAST LUMPECTOMY Left 09/2013   BREAST LUMPECTOMY WITH NEEDLE LOCALIZATION AND AXILLARY LYMPH NODE DISSECTION Left 09/12/2013   Procedure: BREAST LUMPECTOMY WITH NEEDLE LOCALIZATION AND AXILLARY LYMPH NODE DISSECTION;  Surgeon: Kandis Cocking, MD;  Location: [facility];  Service: General;  Laterality: Left;  and axilla   BREAST SURGERY     COLONOSCOPY  10/2010   proctitis; melanosis coli   COLONOSCOPY WITH PROPOFOL N/A 01/04/2018   Dr. Darrick Penna: hemorrhoids, follow up colonoscopy in 10 years   EAR CYST EXCISION Right 09/04/2019   Procedure: EXCISION EAR MASS;  Surgeon: Newman Pies, MD;  Location: [facility];  Service: ENT;  Laterality: Right;   ESOPHAGOGASTRODUODENOSCOPY (EGD) WITH PROPOFOL N/A 01/14/2021   Procedure: ESOPHAGOGASTRODUODENOSCOPY (EGD) WITH PROPOFOL;  Surgeon: Lanelle Bal, DO;  Location: [facility];  Service: Endoscopy;  Laterality: N/A;  9:00am   EXCISION/RELEASE BURSA HIP Left 03/17/2019   Procedure: Left hip open bursectomy;  Surgeon: Yolonda Kida, MD;  Location: [facility] OR;  Service: Orthopedics;  Laterality: Left;  75 mins   EYE SURGERY Left    "fix lazy eye"   PORTACATH PLACEMENT Right 04/21/2013   Procedure: INSERTION PORT-A-CATH;  Surgeon: Kandis Cocking, MD;  Location: [facility];  Service: General;  Laterality: Right;   RIGHT OOPHORECTOMY  03/13/2007   SHOULDER ARTHROSCOPY WITH DISTAL CLAVICLE  RESECTION Right 10/09/2022   Procedure: SHOULDER ARTHROSCOPY WITH DISTAL CLAVICLE RESECTION;  Surgeon: Yolonda Kida, MD;  Location: [facility];  Service: Orthopedics;  Laterality: Right;  120   SHOULDER ARTHROSCOPY WITH ROTATOR CUFF REPAIR AND SUBACROMIAL DECOMPRESSION Right 10/09/2022   Procedure: SHOULDER ARTHROSCOPY WITH ROTATOR CUFF REPAIR AND SUBACROMIAL DECOMPRESSION;  Surgeon: Yolonda Kida, MD;  Location:  SURGERY CENTER;  Service: Orthopedics;  Laterality: Right;   SUBOCCIPITAL CRANIECTOMY CERVICAL LAMINECTOMY N/A 05/10/2014   Procedure: SUBOCCIPITAL CRANIECTOMY CERVICAL LAMINECTOMY/DURAPLASTY;  Surgeon: Hewitt Shorts, MD;  Location: [facility];  Service: Neurosurgery;  Laterality: N/A;  suboccipital craniectomy with cervical laminectomy and duraplasty   TUBAL LIGATION  OB History     Gravida  4   Para  4   Term  4   Preterm      AB      Living  4      SAB      IAB      Ectopic      Multiple      Live Births  4           Allergies  Allergen Reactions   Doxycycline Itching and Swelling    Social History   Socioeconomic History   Marital status: Married    Spouse name: Peyton Najjar   Number of children: 4   Years of education: Not on file   Highest education level: Not on file  Occupational History   Occupation: Factory work    Associate Professor: Nurse, adult [employer]  Tobacco Use   Smoking status: Former    Packs/day: 0.50    Years: 15.00    Additional pack years: 0.00    Total pack years: 7.50    Types: Cigarettes    Quit date: 07/06/1982    Years since quitting: 40.3   Smokeless tobacco: Never  Vaping Use   Vaping Use: Never used  Substance and Sexual Activity   Alcohol use: No   Drug use: No   Sexual activity: Yes    Partners: Male    Birth control/protection: Surgical    Comment: TAH  Other Topics Concern   Not on file  Social History Narrative   4 biological children   4 step children   13  grandchildren    1 great grandchildren   2 foster children   Social Determinants of Health   Financial Resource Strain: Low Risk  (10/26/2022)   Overall Financial Resource Strain (CARDIA)    Difficulty of Paying Living Expenses: Not very hard  Food Insecurity: No Food Insecurity (10/26/2022)   Hunger Vital Sign    Worried About Running Out of Food in the Last Year: Never true    Ran Out of Food in the Last Year: Never true  Transportation Needs: No Transportation Needs (10/26/2022)   PRAPARE - Administrator, Civil Service (Medical): No    Lack of Transportation (Non-Medical): No  Physical Activity: Inactive (10/26/2022)   Exercise Vital Sign    Days of Exercise per Week: 0 days    Minutes of Exercise per Session: 10 min  Stress: No Stress Concern Present (10/26/2022)   [employer] of Occupational Health - Occupational Stress Questionnaire    Feeling of Stress : Only a little  Social Connections: Moderately Integrated (10/26/2022)   Social Connection and Isolation Panel [NHANES]    Frequency of Communication with Friends and Family: Never    Frequency of Social Gatherings with Friends and Family: Once a week    Attends Religious Services: More than 4 times per year    Active Member of [employer] or Organizations: Yes    Attends Engineer, structural: More than 4 times per year    Marital Status: Married    Family History  Problem Relation Age of Onset   Aneurysm Mother        Cerebral   Hypertension Mother    Hyperlipidemia Mother    Stroke Mother    Depression Mother    Coronary artery disease Father    Hypertension Father    Hyperlipidemia Father    Heart attack Father        d. 8  Lung cancer Father        dx early 23s; former smoker   Lung cancer Sister        paternal half-sister; not a smoker; dx <60; d. 39y   Breast cancer Sister 70   Other Sister        hx of hysterectomy for unspecified reason   Eczema Brother    Lung cancer Brother     Lung cancer Brother        d. 65y; smoker   Multiple myeloma Brother        d. 66s   Lung cancer Brother        dx late 58s; smoker   Arthritis Brother    Prostate cancer Brother        dx late 71s   Prostate cancer Brother        dx late 48s   Lung cancer Brother    Diabetes Brother    Leukemia Maternal Aunt        "blood cancer"; d. unspecified age   Lung cancer Maternal Aunt        d. unspecified age   Breast cancer Maternal Aunt        dx unspecified age   Cervical cancer Paternal Aunt        d. late 79s   Anxiety disorder Daughter    Prostate cancer Cousin        maternal 1st cousin; dx older age   Breast cancer Other        niece dx early 14s or before   Leukemia Other        nephew d. 2y; "blood cancer"   Colon cancer Neg Hx     Medications:       Current Outpatient Medications:    atenolol (TENORMIN) 50 MG tablet, Take 1 tablet (50 mg total) by mouth daily., Disp: 90 tablet, Rfl: 3   Blood Glucose Monitoring Suppl (ONE TOUCH ULTRA 2) w/Device KIT, DISPENSE BASED ON PATIENT AND INSURANCE PREFERENCE. USE UP TO FOUR TIMES DAILY AS DIRECTED., Disp: 1 kit, Rfl: 0   diphenhydramine-acetaminophen (TYLENOL PM) 25-500 MG TABS tablet, Take 1 tablet by mouth at bedtime as needed (sleep)., Disp: , Rfl:    EPINEPHrine 0.3 mg/0.3 mL IJ SOAJ injection, Inject 0.3 mg into the muscle as needed for anaphylaxis. , Disp: , Rfl: 1   escitalopram (LEXAPRO) 20 MG tablet, TAKE 1 TABLET BY MOUTH EVERY DAY, Disp: 90 tablet, Rfl: 1   famotidine (PEPCID) 40 MG tablet, TAKE 1 TABLET BY MOUTH EVERYDAY AT BEDTIME, Disp: 90 tablet, Rfl: 1   fluticasone (FLONASE) 50 MCG/ACT nasal spray, Place 2 sprays into both nostrils daily., Disp: 16 g, Rfl: 6   glucose blood (ONETOUCH ULTRA) test strip, USE TO TEST BLOOD SUGAR 4 TIMES A DAY, Disp: 100 strip, Rfl: 1   Insulin Pen Needle (PEN NEEDLES 5/16") 30G X 8 MM MISC, Use with victoza, Disp: 100 each, Rfl: 1   Lancets (ONETOUCH DELICA PLUS LANCET30G) MISC,  USE TO TEST BLOOD SUGAR 4 TIMES A DAY, Disp: 100 each, Rfl: 1   levocetirizine (XYZAL) 5 MG tablet, Take 5 mg by mouth every evening., Disp: , Rfl:    Melatonin 10 MG TABS, Take 10 mg by mouth at bedtime., Disp: , Rfl:    methocarbamol (ROBAXIN) 500 MG tablet, Take 500 mg by mouth daily as needed for muscle spasms., Disp: , Rfl:    pantoprazole (PROTONIX) 40 MG tablet, TAKE 1 TABLET (40 MG  TOTAL) BY MOUTH 2 (TWO) TIMES DAILY BEFORE A MEAL., Disp: 180 tablet, Rfl: 3   rosuvastatin (CRESTOR) 20 MG tablet, Take 1 tablet (20 mg total) by mouth daily., Disp: 90 tablet, Rfl: 3   Semaglutide,0.25 or 0.5MG /DOS, (OZEMPIC, 0.25 OR 0.5 MG/DOSE,) 2 MG/3ML SOPN, INJECT 0.5 MG INTO THE SKIN ONE TIME PER WEEK, Disp: 3 mL, Rfl: 0   valACYclovir (VALTREX) 500 MG tablet, Take 1 tablet (500 mg total) by mouth daily., Disp: 90 tablet, Rfl: 3   HYDROcodone-acetaminophen (NORCO) 5-325 MG tablet, Take 1 tablet by mouth every 4 (four) hours as needed for moderate pain or severe pain. (Patient not taking: Reported on 10/26/2022), Disp: 25 tablet, Rfl: 0   LORazepam (ATIVAN) 0.5 MG tablet, Take 1 tablet (0.5 mg total) by mouth at bedtime as needed for sleep. (Patient not taking: Reported on 10/26/2022), Disp: 30 tablet, Rfl: 0   ondansetron (ZOFRAN) 4 MG tablet, Take 1 tablet (4 mg total) by mouth every 8 (eight) hours as needed for nausea or vomiting. (Patient not taking: Reported on 10/26/2022), Disp: 20 tablet, Rfl: 0   ondansetron (ZOFRAN-ODT) 4 MG disintegrating tablet, Take 1 tablet (4 mg total) by mouth every 8 (eight) hours as needed for nausea or vomiting. (Patient not taking: Reported on 10/26/2022), Disp: 20 tablet, Rfl: 0  Objective Blood pressure 133/72, pulse (!) 58, last menstrual period 12/04/2008.  General WDWN female NAD Vulva:  normal appearing vulva with no masses, tenderness or lesions Vagina:  normal mucosa, no discharge no evidence of RV fistula Cervix:  surgically absent Uterus:  surgically  absent Adnexa: ovaries:present,  normal adnexa in size, nontender and no masses   Pertinent ROS No burning with urination, frequency or urgency No nausea, vomiting or diarrhea Nor fever chills or other constitutional symptoms   Labs or studies Reviewed recent imaging studies all of which were negative    Impression + Management Plan: Diagnoses this Encounter::   ICD-10-CM   1. Suprapubic cramping  R10.2    etiology unclear but scans have been unhelpful, no vaginal sx., likely bowel spasm        Medications prescribed during  this encounter: No orders of the defined types were placed in this encounter.   Labs or Scans Ordered during this encounter: No orders of the defined types were placed in this encounter.     Follow up No follow-ups on file.

## 2022-10-27 DIAGNOSIS — M25611 Stiffness of right shoulder, not elsewhere classified: Secondary | ICD-10-CM | POA: Diagnosis not present

## 2022-10-27 DIAGNOSIS — M25511 Pain in right shoulder: Secondary | ICD-10-CM | POA: Diagnosis not present

## 2022-10-27 NOTE — Progress Notes (Signed)
Vial not needed yet. 

## 2022-10-30 ENCOUNTER — Ambulatory Visit (INDEPENDENT_AMBULATORY_CARE_PROVIDER_SITE_OTHER): Payer: HMO

## 2022-10-30 DIAGNOSIS — J309 Allergic rhinitis, unspecified: Secondary | ICD-10-CM | POA: Diagnosis not present

## 2022-11-02 DIAGNOSIS — M25511 Pain in right shoulder: Secondary | ICD-10-CM | POA: Diagnosis not present

## 2022-11-02 DIAGNOSIS — M25611 Stiffness of right shoulder, not elsewhere classified: Secondary | ICD-10-CM | POA: Diagnosis not present

## 2022-11-05 DIAGNOSIS — M25611 Stiffness of right shoulder, not elsewhere classified: Secondary | ICD-10-CM | POA: Diagnosis not present

## 2022-11-05 DIAGNOSIS — M25511 Pain in right shoulder: Secondary | ICD-10-CM | POA: Diagnosis not present

## 2022-11-06 ENCOUNTER — Ambulatory Visit (INDEPENDENT_AMBULATORY_CARE_PROVIDER_SITE_OTHER): Payer: HMO

## 2022-11-06 DIAGNOSIS — J309 Allergic rhinitis, unspecified: Secondary | ICD-10-CM

## 2022-11-09 DIAGNOSIS — M25511 Pain in right shoulder: Secondary | ICD-10-CM | POA: Diagnosis not present

## 2022-11-09 DIAGNOSIS — M25611 Stiffness of right shoulder, not elsewhere classified: Secondary | ICD-10-CM | POA: Diagnosis not present

## 2022-11-12 DIAGNOSIS — M25511 Pain in right shoulder: Secondary | ICD-10-CM | POA: Diagnosis not present

## 2022-11-12 DIAGNOSIS — M25611 Stiffness of right shoulder, not elsewhere classified: Secondary | ICD-10-CM | POA: Diagnosis not present

## 2022-11-14 ENCOUNTER — Other Ambulatory Visit: Payer: Self-pay | Admitting: Family Medicine

## 2022-11-14 DIAGNOSIS — E119 Type 2 diabetes mellitus without complications: Secondary | ICD-10-CM

## 2022-11-16 DIAGNOSIS — M25511 Pain in right shoulder: Secondary | ICD-10-CM | POA: Diagnosis not present

## 2022-11-16 DIAGNOSIS — M25611 Stiffness of right shoulder, not elsewhere classified: Secondary | ICD-10-CM | POA: Diagnosis not present

## 2022-11-24 DIAGNOSIS — M25511 Pain in right shoulder: Secondary | ICD-10-CM | POA: Diagnosis not present

## 2022-11-24 DIAGNOSIS — M25611 Stiffness of right shoulder, not elsewhere classified: Secondary | ICD-10-CM | POA: Diagnosis not present

## 2022-11-27 ENCOUNTER — Ambulatory Visit (INDEPENDENT_AMBULATORY_CARE_PROVIDER_SITE_OTHER): Payer: HMO

## 2022-11-27 DIAGNOSIS — J309 Allergic rhinitis, unspecified: Secondary | ICD-10-CM

## 2022-12-02 ENCOUNTER — Telehealth: Payer: Self-pay

## 2022-12-02 DIAGNOSIS — R002 Palpitations: Secondary | ICD-10-CM

## 2022-12-02 DIAGNOSIS — I10 Essential (primary) hypertension: Secondary | ICD-10-CM

## 2022-12-02 DIAGNOSIS — F32A Depression, unspecified: Secondary | ICD-10-CM

## 2022-12-02 DIAGNOSIS — E1169 Type 2 diabetes mellitus with other specified complication: Secondary | ICD-10-CM

## 2022-12-02 MED ORDER — ESCITALOPRAM OXALATE 20 MG PO TABS
20.0000 mg | ORAL_TABLET | Freq: Every day | ORAL | 0 refills | Status: DC
Start: 2022-12-02 — End: 2023-03-03

## 2022-12-02 MED ORDER — ATENOLOL 50 MG PO TABS
50.0000 mg | ORAL_TABLET | Freq: Every day | ORAL | 0 refills | Status: DC
Start: 2022-12-02 — End: 2023-03-03

## 2022-12-02 MED ORDER — ROSUVASTATIN CALCIUM 20 MG PO TABS
20.0000 mg | ORAL_TABLET | Freq: Every day | ORAL | 0 refills | Status: AC
Start: 2022-12-02 — End: ?

## 2022-12-02 NOTE — Telephone Encounter (Signed)
rosuvastatin (CRESTOR) 20 MG tablet

## 2022-12-02 NOTE — Telephone Encounter (Signed)
Prescription Request  12/02/2022  LOV: Visit date not found  What is the name of the medication or equipment?   escitalopram (LEXAPRO) 20 MG tablet    Have you contacted your pharmacy to request a refill? Yes   Which pharmacy would you like this sent to?  [id number] - [place], [place] - [address] AT [facility] [address] [place]  [place] [address] Phone: [phone] Fax: [phone]    Patient notified that their request is being sent to the clinical staff for review and that they should receive a response within 2 business days.   Please advise at Mobile [phone] (mobile)

## 2022-12-02 NOTE — Telephone Encounter (Signed)
Received via fax Rx request: Prescription sent electronically to pharmacy  

## 2022-12-02 NOTE — Telephone Encounter (Signed)
atenolol (TENORMIN) 50 MG tablet °

## 2022-12-04 ENCOUNTER — Ambulatory Visit (INDEPENDENT_AMBULATORY_CARE_PROVIDER_SITE_OTHER): Payer: HMO

## 2022-12-04 DIAGNOSIS — J309 Allergic rhinitis, unspecified: Secondary | ICD-10-CM | POA: Diagnosis not present

## 2022-12-11 ENCOUNTER — Ambulatory Visit (INDEPENDENT_AMBULATORY_CARE_PROVIDER_SITE_OTHER): Payer: HMO

## 2022-12-11 DIAGNOSIS — J309 Allergic rhinitis, unspecified: Secondary | ICD-10-CM | POA: Diagnosis not present

## 2022-12-14 ENCOUNTER — Other Ambulatory Visit: Payer: Self-pay | Admitting: Family Medicine

## 2022-12-14 DIAGNOSIS — E119 Type 2 diabetes mellitus without complications: Secondary | ICD-10-CM

## 2022-12-16 ENCOUNTER — Ambulatory Visit
Admission: RE | Admit: 2022-12-16 | Discharge: 2022-12-16 | Disposition: A | Discharge status: Home / Self Care | Payer: HMO | Source: Ambulatory Visit | Attending: Family Medicine | Admitting: Family Medicine

## 2022-12-16 ENCOUNTER — Ambulatory Visit: Admission: RE | Admit: 2022-12-16 | Payer: Medicare HMO | Source: Ambulatory Visit

## 2022-12-16 DIAGNOSIS — N6489 Other specified disorders of breast: Secondary | ICD-10-CM

## 2022-12-18 ENCOUNTER — Ambulatory Visit (INDEPENDENT_AMBULATORY_CARE_PROVIDER_SITE_OTHER): Payer: HMO

## 2022-12-18 DIAGNOSIS — J309 Allergic rhinitis, unspecified: Secondary | ICD-10-CM

## 2022-12-28 ENCOUNTER — Other Ambulatory Visit: Payer: Self-pay | Admitting: Family Medicine

## 2022-12-29 DIAGNOSIS — Z4789 Encounter for other orthopedic aftercare: Secondary | ICD-10-CM | POA: Diagnosis not present

## 2022-12-29 DIAGNOSIS — M25511 Pain in right shoulder: Secondary | ICD-10-CM | POA: Diagnosis not present

## 2023-01-01 ENCOUNTER — Ambulatory Visit (INDEPENDENT_AMBULATORY_CARE_PROVIDER_SITE_OTHER): Payer: HMO

## 2023-01-01 DIAGNOSIS — J309 Allergic rhinitis, unspecified: Secondary | ICD-10-CM | POA: Diagnosis not present

## 2023-01-11 ENCOUNTER — Ambulatory Visit: Payer: HMO | Admitting: Nurse Practitioner

## 2023-01-11 VITALS — BP 111/74 | HR 57 | Temp 97.3°F | Ht 61.0 in | Wt 133.0 lb

## 2023-01-11 DIAGNOSIS — F32A Depression, unspecified: Secondary | ICD-10-CM

## 2023-01-11 DIAGNOSIS — S40262A Insect bite (nonvenomous) of left shoulder, initial encounter: Secondary | ICD-10-CM

## 2023-01-11 DIAGNOSIS — F419 Anxiety disorder, unspecified: Secondary | ICD-10-CM | POA: Diagnosis not present

## 2023-01-11 DIAGNOSIS — W57XXXA Bitten or stung by nonvenomous insect and other nonvenomous arthropods, initial encounter: Secondary | ICD-10-CM

## 2023-01-11 MED ORDER — CLONAZEPAM 0.5 MG PO TABS: ORAL_TABLET | ORAL | 0 refills | Status: DC

## 2023-01-11 MED ORDER — BUPROPION HCL ER (XL) 150 MG PO TB24: 150.0000 mg | ORAL_TABLET | Freq: Every day | ORAL | 0 refills | Status: DC

## 2023-01-11 MED ORDER — TRIAMCINOLONE ACETONIDE 0.1 % EX CREA: 1.0000 | TOPICAL_CREAM | Freq: Two times a day (BID) | CUTANEOUS | 0 refills | Status: DC

## 2023-01-11 NOTE — Progress Notes (Signed)
Subjective:    Patient ID: [name] [name], female    DOB: 26-Oct-1960, 62 y.o.   MRN: [id number]  HPI Presents for recheck on her anxiety and depression.  Has been on Lexapro 20 mg long-term.  Seem to be working well until about a month ago.  Has been under increased stress related to caring for her foster children.  Would like to discuss options.  Denies suicidal or homicidal thoughts or ideation.  Denies any self-harm behavior. In addition patient has a history of a tick bite on the left shoulder area from several months ago.  Continues to have some itching there occasionally.  No fever or headache.    01/11/2023    1:55 PM  Depression screen PHQ 2/9  Decreased Interest 0  Down, Depressed, Hopeless 1  PHQ - 2 Score 1  Altered sleeping 2  Tired, decreased energy 1  Change in appetite 1  Feeling bad or failure about yourself  0  Trouble concentrating 1  Moving slowly or fidgety/restless 0  Suicidal thoughts 0  PHQ-9 Score 6  Difficult doing work/chores Somewhat difficult      01/11/2023    1:55 PM 10/26/2022    1:33 PM 07/27/2022   10:00 AM 06/23/2022    2:24 PM  GAD 7 : Generalized Anxiety Score  Nervous, Anxious, on Edge 0 0 0 0  Control/stop worrying 0 0 0 1  Worry too much - different things 1 0 0 1  Trouble relaxing 1 0 1 1  Restless 0 0 0 0  Easily annoyed or irritable 1 0 0 0  Afraid - awful might happen 0 0 0 0  Total GAD 7 Score 3 0 1 3  Anxiety Difficulty Not difficult at all  Not difficult at all Not difficult at all      Review of Systems  Constitutional:  Positive for fatigue. Negative for fever.  Respiratory:  Negative for cough, chest tightness and shortness of breath.   Cardiovascular:  Negative for chest pain and palpitations.  Neurological:  Negative for headaches.  Psychiatric/Behavioral:  Positive for dysphoric mood. Negative for self-injury and suicidal ideas. The patient is nervous/anxious.        Objective:   Physical Exam NAD.  Alert,  oriented.  Calm affect.  Making good eye contact.  Speech clear.  Dressed appropriately for the weather.  Thoughts logical coherent and relevant.  Normal behavior and judgment.  Lungs clear.  Heart regular rate rhythm.  A small slightly raised faintly pink lesion noted on the posterior upper left shoulder area from the previous tick bite.  No foreign body noted with tangential lighting.  Today's Vitals   01/11/23 1350  BP: 111/74  Pulse: (!) 57  Temp: (!) 97.3 F (36.3 C)  Weight: 133 lb (60.3 kg)  Height: 5\' 1"  (1.549 m)   Body mass index is 25.13 kg/m.        Assessment & Plan:   Problem List Items Addressed This Visit       Other   Anxiety and depression - Primary   Relevant Medications   buPROPion (WELLBUTRIN XL) 150 MG 24 hr tablet   Other Visit Diagnoses     Tick bite of left shoulder, initial encounter          Meds ordered this encounter  Medications   buPROPion (WELLBUTRIN XL) 150 MG 24 hr tablet    Sig: Take 1 tablet (150 mg total) by mouth daily.    Dispense:  30 tablet    Refill:  0    Order Specific Question:   Supervising Provider    Answer:   Lilyan Punt A [[id number]]   triamcinolone cream (KENALOG) 0.1 %    Sig: Apply 1 Application topically 2 (two) times daily. Prn rash; use up to 2 weeks    Dispense:  30 g    Refill:  0    Order Specific Question:   Supervising Provider    Answer:   Lilyan Punt A [[id number]]   clonazePAM (KLONOPIN) 0.5 MG tablet    Sig: Take 1/2-1 tab po once a day prn extreme anxiety    Dispense:  20 tablet    Refill:  0    Order Specific Question:   Supervising Provider    Answer:   Lilyan Punt A [[id number]]   Discussed options at length regarding anxiety and depression.  Patient is taking bupropion well in the past for smoking cessation, continue Lexapro 20 mg daily and add Wellbutrin XL 150 mg daily.  Discussed potential adverse effects.  Discontinue Wellbutrin and contact office if any problems.  Patient also given a small  prescription of low-dose clonazepam to have on hand only for extreme anxiety. Triamcinolone twice a day as needed to tick bite.  Reviewed signs of tick disease although this is unlikely from this bite since it was several months ago. Note that patient gets her preventive health physicals with gynecology. Return for follow up with Dr. Adriana Simas as planned.

## 2023-01-15 ENCOUNTER — Ambulatory Visit (INDEPENDENT_AMBULATORY_CARE_PROVIDER_SITE_OTHER): Payer: HMO

## 2023-01-15 DIAGNOSIS — J309 Allergic rhinitis, unspecified: Secondary | ICD-10-CM | POA: Diagnosis not present

## 2023-01-20 ENCOUNTER — Ambulatory Visit: Payer: Self-pay

## 2023-01-22 ENCOUNTER — Ambulatory Visit (INDEPENDENT_AMBULATORY_CARE_PROVIDER_SITE_OTHER): Payer: HMO

## 2023-01-22 DIAGNOSIS — J309 Allergic rhinitis, unspecified: Secondary | ICD-10-CM | POA: Diagnosis not present

## 2023-01-25 ENCOUNTER — Ambulatory Visit: Payer: PPO | Admitting: Family Medicine

## 2023-01-25 ENCOUNTER — Other Ambulatory Visit: Payer: Self-pay | Admitting: Family Medicine

## 2023-01-25 DIAGNOSIS — N6489 Other specified disorders of breast: Secondary | ICD-10-CM

## 2023-01-27 ENCOUNTER — Ambulatory Visit (INDEPENDENT_AMBULATORY_CARE_PROVIDER_SITE_OTHER): Payer: HMO | Admitting: Allergy & Immunology

## 2023-01-27 ENCOUNTER — Other Ambulatory Visit: Payer: Self-pay

## 2023-01-27 ENCOUNTER — Encounter: Payer: Self-pay | Admitting: Allergy & Immunology

## 2023-01-27 VITALS — HR 67 | Temp 97.6°F | Resp 16 | Ht 60.63 in | Wt 132.6 lb

## 2023-01-27 DIAGNOSIS — J302 Other seasonal allergic rhinitis: Secondary | ICD-10-CM

## 2023-01-27 DIAGNOSIS — J3089 Other allergic rhinitis: Secondary | ICD-10-CM

## 2023-01-27 DIAGNOSIS — J309 Allergic rhinitis, unspecified: Secondary | ICD-10-CM

## 2023-01-27 NOTE — Patient Instructions (Addendum)
1. Seasonal and perennial allergic rhinitis - Previous testing today showed: trees, dust mites, and cockroach. - We are going to use up the vial that is already paid for.  - Continue with: Xyzal (levocetirizine) 5mg  tablet once daily AS NEEDED (you can do this TWICE DAILY if needed).   2. Return in about 6 months (around 07/30/2023).    Please inform us of any Emergency Department visits, hospitalizations, or changes in symptoms. Call us before going to the ED for breathing or allergy symptoms since we might be able to fit you in for a sick visit. Feel free to contact us anytime with any questions, problems, or concerns.  It was a pleasure to see you today!  Websites that have reliable patient information: 1. American Academy of Asthma, Allergy, and Immunology: www.aaaai.org 2. Food Allergy Research and Education (FARE): foodallergy.org 3. Mothers of Asthmatics: http://www.asthmacommunitynetwork.org 4. American College of Allergy, Asthma, and Immunology: www.acaai.org   COVID-19 Vaccine Information can be found at: [email or web address] For questions related to vaccine distribution or appointments, please email [email or web address] or call [phone].   We realize that you might be concerned about having an allergic reaction to the COVID19 vaccines. To help with that concern, WE ARE OFFERING THE COVID19 VACCINES IN OUR OFFICE! Ask the front desk for dates!     "Like" [place] on Facebook and Instagram for our latest updates!      A healthy democracy works best when [employer] participate! Make sure you are registered to vote! If you have moved or changed any of your contact information, you will need to get this updated before voting!  In some cases, you MAY be able to register to vote online: [email or web address]

## 2023-01-27 NOTE — Progress Notes (Signed)
FOLLOW UP  Date of Service/Encounter:  01/27/23   Assessment:   Seasonal and perennial allergic rhinitis (trees, dust mites, and cockroach)    [name] [name] nearly 5 years of allergen immunotherapy.  She is interested in stopping her shots, which is completely reasonable.  At this point, she denies using any medications on a daily basis.  She is going to finish up the vial that is already made and I will see how she is doing in 6 months.  Plan/Recommendations:    1. Seasonal and perennial allergic rhinitis - Previous testing today showed: trees, dust mites, and cockroach. - We are going to use up the vial that is already paid for.  - Continue with: Xyzal (levocetirizine) 5mg  tablet once daily AS NEEDED (you can do this TWICE DAILY if needed).   2. Return in about 6 months (around 07/30/2023).    Subjective:   [name] [name] is a 62 y.o. female presenting today for follow up of  Chief Complaint  Patient presents with   Allergic Rhinitis     No issues    [name] [name] has a history of the following: Patient Active Problem List   Diagnosis Date Noted   Anxiety and depression 07/27/2022   Allergic rhinitis 07/15/2021   NAFLD (nonalcoholic fatty liver disease) 04/54/0981   Psychophysiological insomnia 08/23/2020   Hyperlipidemia associated with type 2 diabetes mellitus (HCC) 03/22/2017   Diabetes (HCC)    IBS (irritable bowel syndrome) 12/10/2015   History of Chiari malformation 08/23/2014   Malignant neoplasm of upper-outer quadrant of left breast in female, estrogen receptor positive (HCC) 04/06/2013   Obesity (BMI 30-39.9) 12/14/2012   GERD (gastroesophageal reflux disease) 12/14/2012   Essential hypertension 09/02/2012    History obtained from: chart review and patient.  [name] [name] is a 62 y.o. female presenting for a follow up visit.  She was last seen in January 2024.  At that time, we continue with Xyzal 5 mg daily as needed.  We continue with allergy shots at  the same schedule.  Since last visit, she has done well.  She is only using the Xyzal on an as-needed basis.  She does not use any nose sprays.  She has not been on antibiotics.  [name] [name] is on allergen immunotherapy. She receives one injection. Immunotherapy script #1 contains trees and dust mites. She currently receives 0.3mL of the RED vial (1/100). She started shots December of 2023 and reached maintenance in December of 2023. She came to [place] on allergy shots and we re-mixed them when we first saw her hence the reason she was started on her Red Vial in October 2023. She is getting weekly injections at this point in time.   She has not had sinus infections at this point. She has some more problems when she is around a lot of  She was on shots for 5-6 years in her 13s. Then she went to [facility] for two years before she started shots with [place].  Otherwise, there have been no changes to her past medical history, surgical history, family history, or social history.    Review of systems otherwise negative other than that mentioned in the HPI.    Objective:   Pulse 67, temperature 97.6 F (36.4 C), resp. rate 16, height 5' 0.63" (1.54 m), weight 132 lb 9.6 oz (60.1 kg), last menstrual period 12/04/2008, SpO2 97%. Body mass index is 25.36 kg/m.    Physical Exam Vitals reviewed.  Constitutional:  Appearance: She is well-developed.     Comments: Delightful.  HENT:     Head: Normocephalic and atraumatic.     Right Ear: Tympanic membrane, ear canal and external ear normal. No drainage, swelling or tenderness. Tympanic membrane is not injected, scarred, erythematous, retracted or bulging.     Left Ear: Tympanic membrane, ear canal and external ear normal. No drainage, swelling or tenderness. Tympanic membrane is not injected, scarred, erythematous, retracted or bulging.     Nose: No nasal deformity, septal deviation, mucosal edema or rhinorrhea.     Right Turbinates:  Enlarged, swollen and pale.     Left Turbinates: Enlarged, swollen and pale.     Right Sinus: No maxillary sinus tenderness or frontal sinus tenderness.     Left Sinus: No maxillary sinus tenderness or frontal sinus tenderness.     Comments: No nasal polyps.    Mouth/Throat:     Mouth: Mucous membranes are not pale and not dry.     Pharynx: Uvula midline.  Eyes:     General:        Right eye: No discharge.        Left eye: No discharge.     Conjunctiva/sclera: Conjunctivae normal.     Right eye: Right conjunctiva is not injected. No chemosis.    Left eye: Left conjunctiva is not injected. No chemosis.    Pupils: Pupils are equal, round, and reactive to light.  Cardiovascular:     Rate and Rhythm: Normal rate and regular rhythm.     Heart sounds: Normal heart sounds.  Pulmonary:     Effort: Pulmonary effort is normal. No tachypnea, accessory muscle usage or respiratory distress.     Breath sounds: Normal breath sounds. No wheezing, rhonchi or rales.  Chest:     Chest wall: No tenderness.  Lymphadenopathy:     Head:     Right side of head: No submandibular, tonsillar or occipital adenopathy.     Left side of head: No submandibular, tonsillar or occipital adenopathy.     Cervical: No cervical adenopathy.  Skin:    Coloration: Skin is not pale.     Findings: No abrasion, erythema, petechiae or rash. Rash is not papular, urticarial or vesicular.  Neurological:     Mental Status: She is alert.  Psychiatric:        Behavior: Behavior is cooperative.      Diagnostic studies: none       [name] Bonds, MD  [facility]

## 2023-02-02 ENCOUNTER — Other Ambulatory Visit: Payer: Self-pay | Admitting: Nurse Practitioner

## 2023-02-05 ENCOUNTER — Ambulatory Visit (INDEPENDENT_AMBULATORY_CARE_PROVIDER_SITE_OTHER): Payer: HMO

## 2023-02-05 DIAGNOSIS — J309 Allergic rhinitis, unspecified: Secondary | ICD-10-CM

## 2023-02-12 ENCOUNTER — Ambulatory Visit (INDEPENDENT_AMBULATORY_CARE_PROVIDER_SITE_OTHER): Payer: HMO

## 2023-02-12 DIAGNOSIS — J309 Allergic rhinitis, unspecified: Secondary | ICD-10-CM | POA: Diagnosis not present

## 2023-02-14 NOTE — Progress Notes (Signed)
Pharmacy Quality Measure Review  This patient is appearing on a report for being at risk of failing the adherence measure for diabetes medications this calendar year.   Medication: semaglutide 0.5 mg weekly Last fill date: 01/21/23 for 28 day supply  Also noted rosuvastatin 20 mg last filled 09/18/22 for 90ds. However pt likely was using up rosuvastatin 10 mg tabs (filled 08/29/22 for 90ds), accounting for appearance of overdue refill. PCP f/u on 02/26/23.   Insurance report was not up to date. No action needed at this time.   Nils Pyle, PharmD PGY1 Pharmacy Resident

## 2023-02-19 ENCOUNTER — Ambulatory Visit (INDEPENDENT_AMBULATORY_CARE_PROVIDER_SITE_OTHER): Payer: HMO

## 2023-02-19 DIAGNOSIS — J309 Allergic rhinitis, unspecified: Secondary | ICD-10-CM | POA: Diagnosis not present

## 2023-02-26 ENCOUNTER — Ambulatory Visit (INDEPENDENT_AMBULATORY_CARE_PROVIDER_SITE_OTHER): Payer: HMO

## 2023-02-26 ENCOUNTER — Ambulatory Visit (INDEPENDENT_AMBULATORY_CARE_PROVIDER_SITE_OTHER): Payer: HMO | Admitting: Family Medicine

## 2023-02-26 DIAGNOSIS — K219 Gastro-esophageal reflux disease without esophagitis: Secondary | ICD-10-CM | POA: Diagnosis not present

## 2023-02-26 DIAGNOSIS — E785 Hyperlipidemia, unspecified: Secondary | ICD-10-CM | POA: Diagnosis not present

## 2023-02-26 DIAGNOSIS — E1169 Type 2 diabetes mellitus with other specified complication: Secondary | ICD-10-CM

## 2023-02-26 DIAGNOSIS — I10 Essential (primary) hypertension: Secondary | ICD-10-CM | POA: Diagnosis not present

## 2023-02-26 DIAGNOSIS — J309 Allergic rhinitis, unspecified: Secondary | ICD-10-CM | POA: Diagnosis not present

## 2023-02-26 DIAGNOSIS — Z7985 Long-term (current) use of injectable non-insulin antidiabetic drugs: Secondary | ICD-10-CM | POA: Diagnosis not present

## 2023-02-26 DIAGNOSIS — E119 Type 2 diabetes mellitus without complications: Secondary | ICD-10-CM

## 2023-02-26 DIAGNOSIS — F32A Depression, unspecified: Secondary | ICD-10-CM | POA: Diagnosis not present

## 2023-02-26 DIAGNOSIS — F419 Anxiety disorder, unspecified: Secondary | ICD-10-CM

## 2023-02-26 NOTE — Patient Instructions (Signed)
Labs today.  Stop Protonix and Ozempic.  Follow up in 6 months.

## 2023-02-27 LAB — LIPID PANEL
Chol/HDL Ratio: 2.4 ratio (ref 0.0–4.4)
Cholesterol, Total: 171 mg/dL (ref 100–199)
HDL: 72 mg/dL (ref >39)
LDL Chol Calc (NIH): 86 mg/dL (ref 0–99)
Triglycerides: 67 mg/dL (ref 0–149)
VLDL Cholesterol Cal: 13 mg/dL (ref 5–40)

## 2023-02-27 LAB — HEMOGLOBIN A1C
Est. average glucose Bld gHb Est-mCnc: 114 mg/dL
Hgb A1c MFr Bld: 5.6 % (ref 4.8–5.6)

## 2023-03-01 NOTE — Assessment & Plan Note (Signed)
A1c returned at 5.6.  Stopping Ozempic.

## 2023-03-01 NOTE — Assessment & Plan Note (Signed)
At goal on atenolol.  Continue.

## 2023-03-01 NOTE — Progress Notes (Signed)
Subjective:  Patient ID: [name] [name], female    DOB: [birth year]  Age: 62 y.o. MRN: [id number]  CC: Follow-up   HPI:  62 year old female presents for follow-up.  Blood pressure is well-controlled on atenolol.  Patient states that her anxiety and depression are stable on Wellbutrin, Lexapro.  Needs lipid panel today to assess control of lipids.  Currently on Crestor 20 mg daily.  Patient's diabetes has been exceptionally well-controlled.  She is currently on Ozempic.  Needs A1c.  Patient interested and discontinuation of Protonix.  She would like to discuss this today  Patient Active Problem List   Diagnosis Date Noted   Anxiety and depression 07/27/2022   Allergic rhinitis 07/15/2021   NAFLD (nonalcoholic fatty liver disease) 86/57/8469   Psychophysiological insomnia 08/23/2020   Hyperlipidemia associated with type 2 diabetes mellitus (HCC) 03/22/2017   Diabetes (HCC)    IBS (irritable bowel syndrome) 12/10/2015   History of Chiari malformation 08/23/2014   Malignant neoplasm of upper-outer quadrant of left breast in female, estrogen receptor positive (HCC) 04/06/2013   Obesity (BMI 30-39.9) 12/14/2012   GERD (gastroesophageal reflux disease) 12/14/2012   Essential hypertension 09/02/2012    Social Hx   Social History   Socioeconomic History   Marital status: Married    Spouse name: Peyton Najjar   Number of children: 4   Years of education: Not on file   Highest education level: 12th grade  Occupational History   Occupation: Marine scientist work    Associate Professor: Nurse, adult [employer]  Tobacco Use   Smoking status: Former    Current packs/day: 0.00    Average packs/day: 0.5 packs/day for 15.0 years (7.5 ttl pk-yrs)    Types: Cigarettes    Start date: 07/07/1967    Quit date: 07/06/1982    Years since quitting: 40.6   Smokeless tobacco: Never  Vaping Use   Vaping status: Never Used  Substance and Sexual Activity   Alcohol use: No   Drug use: No   Sexual activity: Yes     Partners: Male    Birth control/protection: Surgical    Comment: TAH  Other Topics Concern   Not on file  Social History Narrative   4 biological children   4 step children   13 grandchildren    1 great grandchildren   2 foster children   Social Determinants of Health   Financial Resource Strain: Low Risk  (01/11/2023)   Overall Financial Resource Strain (CARDIA)    Difficulty of Paying Living Expenses: Not very hard  Food Insecurity: No Food Insecurity (01/11/2023)   Hunger Vital Sign    Worried About Running Out of Food in the Last Year: Never true    Ran Out of Food in the Last Year: Never true  Transportation Needs: No Transportation Needs (01/11/2023)   PRAPARE - Administrator, Civil Service (Medical): No    Lack of Transportation (Non-Medical): No  Physical Activity: Inactive (01/11/2023)   Exercise Vital Sign    Days of Exercise per Week: 0 days    Minutes of Exercise per Session: 10 min  Stress: No Stress Concern Present (10/26/2022)   [employer] of Occupational Health - Occupational Stress Questionnaire    Feeling of Stress : Only a little  Social Connections: Moderately Integrated (01/11/2023)   Social Connection and Isolation Panel [NHANES]    Frequency of Communication with Friends and Family: Never    Frequency of Social Gatherings with Friends and Family: Once a week  Attends Religious Services: 1 to 4 times per year    Active Member of Clubs or Organizations: Yes    Attends Banker Meetings: 1 to 4 times per year    Marital Status: Married    Review of Systems  Respiratory: Negative.    Cardiovascular: Negative.    Objective:  BP 104/67   Pulse 74   Wt 130 lb (59 kg)   LMP 12/04/2008   SpO2 98%   BMI 24.86 kg/m      02/26/2023   11:03 AM 01/27/2023   10:35 AM 01/11/2023    1:50 PM  BP/Weight  Systolic BP 104  161  Diastolic BP 67  74  Wt. (Lbs) 130 132.6 133  BMI 24.86 kg/m2 25.36 kg/m2 25.13 kg/m2    Physical  Exam Vitals reviewed.  Constitutional:      General: She is not in acute distress.    Appearance: Normal appearance.  HENT:     Head: Normocephalic and atraumatic.  Eyes:     General:        Right eye: No discharge.        Left eye: No discharge.     Conjunctiva/sclera: Conjunctivae normal.  Cardiovascular:     Rate and Rhythm: Normal rate and regular rhythm.  Pulmonary:     Effort: Pulmonary effort is normal.     Breath sounds: Normal breath sounds. No wheezing, rhonchi or rales.  Neurological:     Mental Status: She is alert.  Psychiatric:        Mood and Affect: Mood normal.        Behavior: Behavior normal.     Lab Results  Component Value Date   WBC 8.7 08/12/2022   HGB 12.7 08/12/2022   HCT 37.5 08/12/2022   PLT 252 08/12/2022   GLUCOSE 80 10/07/2022   CHOL 171 02/26/2023   TRIG 67 02/26/2023   HDL 72 02/26/2023   LDLCALC 86 02/26/2023   ALT 15 08/12/2022   AST 19 08/12/2022   NA 141 10/07/2022   K 4.2 10/07/2022   CL 106 10/07/2022   CREATININE 0.97 10/07/2022   BUN 7 (L) 10/07/2022   CO2 27 10/07/2022   TSH 1.990 05/08/2020   INR 1.0 06/06/2008   HGBA1C 5.6 02/26/2023     Assessment & Plan:   Problem List Items Addressed This Visit       Cardiovascular and Mediastinum   Essential hypertension    At goal on atenolol.  Continue.        Digestive   GERD (gastroesophageal reflux disease)    Trial off Protonix.        Endocrine   Hyperlipidemia associated with type 2 diabetes mellitus (HCC)    LDL 86.  Consider increasing Crestor.  This was sent to the patient.      Relevant Orders   Lipid panel (Completed)   Diabetes (HCC)    A1c returned at 5.6.  Stopping Ozempic.      Relevant Orders   Hemoglobin A1c (Completed)     Other   Anxiety and depression    Stable on current medications.  Continue.       Follow-up:  Return in about 6 months (around 08/29/2023).  Everlene Other DO [facility]

## 2023-03-01 NOTE — Assessment & Plan Note (Signed)
Trial off Protonix.

## 2023-03-01 NOTE — Assessment & Plan Note (Signed)
Stable on current medications.  Continue. 

## 2023-03-01 NOTE — Assessment & Plan Note (Signed)
LDL 86.  Consider increasing Crestor.  This was sent to the patient.

## 2023-03-03 ENCOUNTER — Other Ambulatory Visit: Payer: Self-pay | Admitting: Family Medicine

## 2023-03-03 ENCOUNTER — Other Ambulatory Visit: Payer: Self-pay | Admitting: Internal Medicine

## 2023-03-03 DIAGNOSIS — R002 Palpitations: Secondary | ICD-10-CM

## 2023-03-03 DIAGNOSIS — I10 Essential (primary) hypertension: Secondary | ICD-10-CM

## 2023-03-03 DIAGNOSIS — F32A Depression, unspecified: Secondary | ICD-10-CM

## 2023-03-05 ENCOUNTER — Ambulatory Visit (INDEPENDENT_AMBULATORY_CARE_PROVIDER_SITE_OTHER): Payer: HMO

## 2023-03-05 DIAGNOSIS — J309 Allergic rhinitis, unspecified: Secondary | ICD-10-CM | POA: Diagnosis not present

## 2023-03-12 ENCOUNTER — Ambulatory Visit (INDEPENDENT_AMBULATORY_CARE_PROVIDER_SITE_OTHER): Payer: HMO

## 2023-03-12 DIAGNOSIS — J309 Allergic rhinitis, unspecified: Secondary | ICD-10-CM

## 2023-03-19 ENCOUNTER — Ambulatory Visit (INDEPENDENT_AMBULATORY_CARE_PROVIDER_SITE_OTHER): Payer: Self-pay

## 2023-03-19 DIAGNOSIS — J309 Allergic rhinitis, unspecified: Secondary | ICD-10-CM

## 2023-03-21 ENCOUNTER — Other Ambulatory Visit: Payer: Self-pay | Admitting: Nurse Practitioner

## 2023-03-21 DIAGNOSIS — E119 Type 2 diabetes mellitus without complications: Secondary | ICD-10-CM

## 2023-03-31 DIAGNOSIS — E119 Type 2 diabetes mellitus without complications: Secondary | ICD-10-CM | POA: Diagnosis not present

## 2023-03-31 DIAGNOSIS — H35363 Drusen (degenerative) of macula, bilateral: Secondary | ICD-10-CM | POA: Diagnosis not present

## 2023-03-31 DIAGNOSIS — H25813 Combined forms of age-related cataract, bilateral: Secondary | ICD-10-CM | POA: Diagnosis not present

## 2023-03-31 DIAGNOSIS — H524 Presbyopia: Secondary | ICD-10-CM | POA: Diagnosis not present

## 2023-03-31 DIAGNOSIS — H52223 Regular astigmatism, bilateral: Secondary | ICD-10-CM | POA: Diagnosis not present

## 2023-03-31 DIAGNOSIS — H5203 Hypermetropia, bilateral: Secondary | ICD-10-CM | POA: Diagnosis not present

## 2023-03-31 LAB — HM DIABETES EYE EXAM

## 2023-04-05 ENCOUNTER — Telehealth: Payer: Self-pay | Admitting: Family Medicine

## 2023-04-05 NOTE — Telephone Encounter (Signed)
Patient was seen on 8/23 and was told cholesterol medication was going to be changed, please advise [facility] [place]

## 2023-04-06 MED ORDER — ROSUVASTATIN CALCIUM 40 MG PO TABS: 40.0000 mg | ORAL_TABLET | Freq: Every day | ORAL | 3 refills | Status: DC

## 2023-04-06 NOTE — Telephone Encounter (Signed)
See MyChart message

## 2023-04-22 ENCOUNTER — Telehealth: Payer: Self-pay

## 2023-04-22 NOTE — Telephone Encounter (Signed)
Called and left message for patient to call office. (Nurse Note* Dr Adriana Simas increased your Crestor to 40 mg daily. Your prescription has been sent to the pharmacy.)

## 2023-04-27 NOTE — Progress Notes (Signed)
This patient is appearing on a report for being at risk of failing the adherence measure for cholesterol (statin) medications this calendar year.   Medication: rosuvastatin  Last fill date: 04/06/23 for 90 day supply  Pt was recently increased from rosuva 20 mg PO daily to rosuva 40 mg PO daily with LDL-C of 86 mg/dL. Pt was notified via telephone by PCP office. Appears [facility].   Insurance report was not up to date. No action needed at this time.   Nils Pyle, PharmD PGY1 Pharmacy Resident

## 2023-05-25 NOTE — Patient Instructions (Incomplete)
Allergic rhinitis Continue allergen avoidance measures directed toward tree pollen, dust mite, and cockroach as listed below Continue Xyzal 5 mg once a day as needed for runny nose or itch.  You may take an additional dose of Xyzal 5 mg once a day if needed for breakthrough symptoms Consider saline nasal rinses as needed for nasal symptoms. Use this before any medicated nasal sprays for best result  Call the clinic if this treatment plan is not working well for you.  Follow up in *** or sooner if needed.  Reducing Pollen Exposure The American Academy of Allergy, Asthma and Immunology suggests the following steps to reduce your exposure to pollen during allergy seasons. Do not hang sheets or clothing out to dry; pollen may collect on these items. Do not mow lawns or spend time around freshly cut grass; mowing stirs up pollen. Keep windows closed at night.  Keep car windows closed while driving. Minimize morning activities outdoors, a time when pollen counts are usually at their highest. Stay indoors as much as possible when pollen counts or humidity is high and on windy days when pollen tends to remain in the air longer. Use air conditioning when possible.  Many air conditioners have filters that trap the pollen spores. Use a HEPA room air filter to remove pollen form the indoor air you breathe.   Control of Dust Mite Allergen Dust mites play a major role in allergic asthma and rhinitis. They occur in environments with high humidity wherever human skin is found. Dust mites absorb humidity from the atmosphere (ie, they do not drink) and feed on organic matter (including shed human and animal skin). Dust mites are a microscopic type of insect that you cannot see with the naked eye. High levels of dust mites have been detected from mattresses, pillows, carpets, upholstered furniture, bed covers, clothes, soft toys and any woven material. The principal allergen of the dust mite is found in its feces.  A gram of dust may contain 1,000 mites and 250,000 fecal particles. Mite antigen is easily measured in the air during house cleaning activities. Dust mites do not bite and do not cause harm to humans, other than by triggering allergies/asthma.  Ways to decrease your exposure to dust mites in your home:  1. Encase mattresses, box springs and pillows with a mite-impermeable barrier or cover  2. Wash sheets, blankets and drapes weekly in hot water (130 F) with detergent and dry them in a dryer on the hot setting.  3. Have the room cleaned frequently with a vacuum cleaner and a damp dust-mop. For carpeting or rugs, vacuuming with a vacuum cleaner equipped with a high-efficiency particulate air (HEPA) filter. The dust mite allergic individual should not be in a room which is being cleaned and should wait 1 hour after cleaning before going into the room.  4. Do not sleep on upholstered furniture (eg, couches).  5. If possible removing carpeting, upholstered furniture and drapery from the home is ideal. Horizontal blinds should be eliminated in the rooms where the person spends the most time (bedroom, study, television room). Washable vinyl, roller-type shades are optimal.  6. Remove all non-washable stuffed toys from the bedroom. Wash stuffed toys weekly like sheets and blankets above.  7. Reduce indoor humidity to less than 50%. Inexpensive humidity monitors can be purchased at most hardware stores. Do not use a humidifier as can make the problem worse and are not recommended.  Control of Cockroach Allergen Cockroach allergen has been identified as  an important cause of acute attacks of asthma, especially in urban settings.  There are fifty-five species of cockroach that exist in the [place], however only three, the [place], [place] species produce allergen that can affect patients with Asthma.  Allergens can be obtained from fecal particles, egg casings and secretions from  cockroaches.    Remove food sources. Reduce access to water. Seal access and entry points. Spray runways with 0.5-1% Diazinon or Chlorpyrifos Blow boric acid power under stoves and refrigerator. Place bait stations (hydramethylnon) at feeding sites.

## 2023-05-25 NOTE — Progress Notes (Signed)
   [address] [place] [address] Dept: [phone]  FOLLOW UP NOTE  Patient ID: [name] [name], female    DOB: October 24, 1960  Age: 62 y.o. MRN: [id number] Date of Office Visit: 05/26/2023  Assessment  Chief Complaint: No chief complaint on file.  HPI [name] [name] is a 62 year old female who presents to the clinic for follow-up visit.  She was last seen in this clinic on 01/27/2023 by Dr. Dellis Anes for evaluation of allergic rhinitis on allergy therapy.  At that time, she was interested in stopping allergen immunotherapy after her vial was used up.  She had her last allergen immunotherapy injection directed toward dust mite and tree on 03/19/2023.  Her last environmental allergy skin testing was on 04/08/2022 was positive to tree pollen, dust mite, and cockroach.  Discussed the use of AI scribe software for clinical note transcription with the patient, who gave verbal consent to proceed.  History of Present Illness             Drug Allergies:  Allergies  Allergen Reactions   Doxycycline Itching and Swelling    Physical Exam: LMP 12/04/2008    Physical Exam  Diagnostics:    Assessment and Plan: No diagnosis found.  No orders of the defined types were placed in this encounter.   There are no Patient Instructions on file for this visit.  No follow-ups on file.    Thank you for the opportunity to care for this patient.  Please do not hesitate to contact me with questions.  Thermon Leyland, FNP [facility]

## 2023-05-26 ENCOUNTER — Other Ambulatory Visit: Payer: Self-pay

## 2023-05-26 ENCOUNTER — Ambulatory Visit (INDEPENDENT_AMBULATORY_CARE_PROVIDER_SITE_OTHER): Payer: HMO | Admitting: Family Medicine

## 2023-05-26 ENCOUNTER — Encounter: Payer: Self-pay | Admitting: Family Medicine

## 2023-05-26 VITALS — BP 110/68 | HR 58 | Temp 97.5°F | Ht 60.63 in | Wt 132.8 lb

## 2023-05-26 DIAGNOSIS — J302 Other seasonal allergic rhinitis: Secondary | ICD-10-CM | POA: Diagnosis not present

## 2023-05-26 DIAGNOSIS — J3089 Other allergic rhinitis: Secondary | ICD-10-CM

## 2023-05-26 DIAGNOSIS — H1013 Acute atopic conjunctivitis, bilateral: Secondary | ICD-10-CM | POA: Diagnosis not present

## 2023-05-26 DIAGNOSIS — H1010 Acute atopic conjunctivitis, unspecified eye: Secondary | ICD-10-CM | POA: Insufficient documentation

## 2023-05-26 MED ORDER — EPINEPHRINE 0.3 MG/0.3ML IJ SOAJ: 0.3000 mg | INTRAMUSCULAR | 1 refills | Status: DC | PRN

## 2023-05-26 MED ORDER — MONTELUKAST SODIUM 10 MG PO TABS
10.0000 mg | ORAL_TABLET | Freq: Every day | ORAL | 5 refills | Status: DC
Start: 2023-05-26 — End: 2023-07-05

## 2023-05-26 MED ORDER — CETIRIZINE HCL 10 MG PO TABS
10.0000 mg | ORAL_TABLET | Freq: Every day | ORAL | 5 refills | Status: DC
Start: 2023-05-26 — End: 2023-08-30

## 2023-05-27 ENCOUNTER — Ambulatory Visit: Payer: HMO | Admitting: Internal Medicine

## 2023-06-01 ENCOUNTER — Other Ambulatory Visit: Payer: Self-pay | Admitting: Family Medicine

## 2023-06-10 ENCOUNTER — Encounter: Payer: Self-pay | Admitting: Internal Medicine

## 2023-06-10 ENCOUNTER — Ambulatory Visit: Payer: HMO | Admitting: Internal Medicine

## 2023-06-10 VITALS — BP 114/63 | HR 55 | Temp 97.9°F | Ht 61.0 in | Wt 135.4 lb

## 2023-06-10 DIAGNOSIS — K219 Gastro-esophageal reflux disease without esophagitis: Secondary | ICD-10-CM

## 2023-06-10 DIAGNOSIS — Z800 Family history of malignant neoplasm of digestive organs: Secondary | ICD-10-CM

## 2023-06-10 DIAGNOSIS — K760 Fatty (change of) liver, not elsewhere classified: Secondary | ICD-10-CM | POA: Diagnosis not present

## 2023-06-10 DIAGNOSIS — K5900 Constipation, unspecified: Secondary | ICD-10-CM

## 2023-06-10 MED ORDER — FAMOTIDINE 40 MG PO TABS: 40.0000 mg | ORAL_TABLET | Freq: Every day | ORAL | 3 refills | Status: DC

## 2023-06-10 MED ORDER — PANTOPRAZOLE SODIUM 40 MG PO TBEC: 40.0000 mg | DELAYED_RELEASE_TABLET | Freq: Every day | ORAL | 3 refills | Status: AC

## 2023-06-10 NOTE — Progress Notes (Signed)
Referring Provider: Tommie Sams, DO Primary Care Physician:  Tommie Sams, DO Primary GI:  Dr. Marletta Lor  Chief Complaint  Patient presents with   Follow-up    Follow up for refills    HPI:   [name] [name] is a 62 y.o. female who presents to the clinic today for follow-up visit.     Chronic GERD: She has chronic reflux, previously on Dexilant 60 mg daily.  Was having breakthrough symptoms.  Now on pantoprazole 40 mg daily, Pepcid nightly.  She is also lost nearly 40 pounds since I started seeing her in 2022.  Reflux is well-controlled.  EGD 01/14/2021 unremarkable besides mild H. pylori negative gastritis.  MASLD: RUQ ultrasound 01/21/2021 which was negative besides fatty liver disease.  Risk factors for fatty liver include being overweight, diabetes, high cholesterol.  No previous or current alcohol use.  She is lost 40 pounds since initially seen.  Congratulated her on this.  She feels much better.  No longer requiring medications for diabetes.  Chronic constipation: Previously on Linzess 72 mcg which delete caused numerous loose bowel movements.  Also previously on Amitiza 8 mg twice daily with similar results.  MiraLAX did not help, previously on Dulcolax.  Not currently taking anything.  Last colonoscopy 2019 unremarkable besides hemorrhoids.  Does note family history of colon cancer in her brother.  Also has a personal history of breast cancer.  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  Met with genetic counselor in regards to this.  Patient does not have a diagnosis of Lynch syndrome due to this VUS.   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    Past Medical History:  Diagnosis Date   Allergic rhinitis    Allergy    Anemia, unspecified 05/30/2013   Anxiety    Breast cancer (HCC) 03/31/2013   left   Cancer (HCC)    Chest pain 2009   Consultation-Rothbart, negative chest CT; nl echo in  2005; h/o palpitations   Chiari malformation type I (HCC)    s/p suboccipital craniectomy, C1 laminectomy, superior C2 laminectomy, duraplasty 05/10/14   Colitis 2010   not IBD   Degenerative joint disease    + degenerative joint disease of the lumbosacral spine   Depression    Diabetes (HCC)    Diabetes (HCC)    Dysrhythmia    palpations   Genetic testing 07/01/2016   GERD (gastroesophageal reflux disease)    "a little"   Headache    Heart murmur    "small"   Herpes simplex type II infection    Hypercholesteremia    "slightly high"   Hypertension    does not have high blood pressure   Meningitis 08/22/2014   Palpitations    Personal history of chemotherapy    Personal history of radiation therapy    Urticaria    Wears glasses     Past Surgical History:  Procedure Laterality Date   ABDOMINAL HYSTERECTOMY  03/13/2007   TAH ?BSO--Dr Cheron Every, [place]   BIOPSY  01/14/2021   Procedure: BIOPSY;  Surgeon: Lanelle Bal, DO;  Location: [facility];  Service: Endoscopy;;   BRAIN SURGERY     BREAST EXCISIONAL BIOPSY  04/2003   Left; benign disease   BREAST LUMPECTOMY Left 09/2013   BREAST LUMPECTOMY WITH NEEDLE LOCALIZATION AND AXILLARY LYMPH NODE DISSECTION Left 09/12/2013   Procedure: BREAST LUMPECTOMY WITH NEEDLE LOCALIZATION AND AXILLARY LYMPH  NODE DISSECTION;  Surgeon: Kandis Cocking, MD;  Location: [facility];  Service: General;  Laterality: Left;  and axilla   BREAST SURGERY     COLONOSCOPY  10/2010   proctitis; melanosis coli   COLONOSCOPY WITH PROPOFOL N/A 01/04/2018   Dr. Darrick Penna: hemorrhoids, follow up colonoscopy in 10 years   EAR CYST EXCISION Right 09/04/2019   Procedure: EXCISION EAR MASS;  Surgeon: Newman Pies, MD;  Location: [facility];  Service: ENT;  Laterality: Right;   ESOPHAGOGASTRODUODENOSCOPY (EGD) WITH PROPOFOL N/A 01/14/2021   Procedure: ESOPHAGOGASTRODUODENOSCOPY (EGD) WITH PROPOFOL;  Surgeon: Lanelle Bal,  DO;  Location: [facility];  Service: Endoscopy;  Laterality: N/A;  9:00am   EXCISION/RELEASE BURSA HIP Left 03/17/2019   Procedure: Left hip open bursectomy;  Surgeon: Yolonda Kida, MD;  Location: [facility];  Service: Orthopedics;  Laterality: Left;  75 mins   EYE SURGERY Left    "fix lazy eye"   PORTACATH PLACEMENT Right 04/21/2013   Procedure: INSERTION PORT-A-CATH;  Surgeon: Kandis Cocking, MD;  Location: [facility];  Service: General;  Laterality: Right;   RIGHT OOPHORECTOMY  03/13/2007   SHOULDER ARTHROSCOPY WITH DISTAL CLAVICLE RESECTION Right 10/09/2022   Procedure: SHOULDER ARTHROSCOPY WITH DISTAL CLAVICLE RESECTION;  Surgeon: Yolonda Kida, MD;  Location: [facility];  Service: Orthopedics;  Laterality: Right;  120   SHOULDER ARTHROSCOPY WITH ROTATOR CUFF REPAIR AND SUBACROMIAL DECOMPRESSION Right 10/09/2022   Procedure: SHOULDER ARTHROSCOPY WITH ROTATOR CUFF REPAIR AND SUBACROMIAL DECOMPRESSION;  Surgeon: Yolonda Kida, MD;  Location:  SURGERY CENTER;  Service: Orthopedics;  Laterality: Right;   SUBOCCIPITAL CRANIECTOMY CERVICAL LAMINECTOMY N/A 05/10/2014   Procedure: SUBOCCIPITAL CRANIECTOMY CERVICAL LAMINECTOMY/DURAPLASTY;  Surgeon: Hewitt Shorts, MD;  Location: [facility];  Service: Neurosurgery;  Laterality: N/A;  suboccipital craniectomy with cervical laminectomy and duraplasty   TUBAL LIGATION      Current Outpatient Medications  Medication Sig Dispense Refill   atenolol (TENORMIN) 50 MG tablet TAKE 1 TABLET BY MOUTH EVERY DAY 90 tablet 0   Blood Glucose Monitoring Suppl (ONE TOUCH ULTRA 2) w/Device KIT DISPENSE BASED ON PATIENT AND INSURANCE PREFERENCE. USE UP TO FOUR TIMES DAILY AS DIRECTED. 1 kit 0   buPROPion (WELLBUTRIN XL) 150 MG 24 hr tablet TAKE 1 TABLET BY MOUTH EVERY DAY 90 tablet 1   cetirizine (ZYRTEC) 10 MG tablet Take 1 tablet (10 mg total) by mouth daily. 30 tablet 5   clonazePAM (KLONOPIN) 0.5 MG tablet Take 1/2-1  tab po once a day prn extreme anxiety 20 tablet 0   diphenhydramine-acetaminophen (TYLENOL PM) 25-500 MG TABS tablet Take 1 tablet by mouth at bedtime as needed (sleep).     EPINEPHrine 0.3 mg/0.3 mL IJ SOAJ injection Inject 0.3 mg into the muscle as needed for anaphylaxis. 1 each 1   escitalopram (LEXAPRO) 20 MG tablet TAKE 1 TABLET BY MOUTH EVERY DAY 90 tablet 0   famotidine (PEPCID) 40 MG tablet TAKE 1 TABLET BY MOUTH EVERYDAY AT BEDTIME 90 tablet 1   fluticasone (FLONASE) 50 MCG/ACT nasal spray SPRAY 2 SPRAYS INTO EACH NOSTRIL EVERY DAY 48 mL 2   glucose blood (ONETOUCH ULTRA) test strip USE TO TEST BLOOD SUGAR 4 TIMES A DAY 100 strip 1   Lancets (ONETOUCH DELICA PLUS LANCET30G) MISC USE TO TEST BLOOD SUGAR 4 TIMES A DAY 100 each 1   Melatonin 10 MG TABS Take 10 mg by mouth at bedtime.     montelukast (  SINGULAIR) 10 MG tablet Take 1 tablet (10 mg total) by mouth at bedtime. 30 tablet 5   rosuvastatin (CRESTOR) 40 MG tablet Take 1 tablet (40 mg total) by mouth daily. 90 tablet 3   triamcinolone cream (KENALOG) 0.1 % Apply 1 Application topically 2 (two) times daily. Prn rash; use up to 2 weeks 30 g 0   valACYclovir (VALTREX) 500 MG tablet Take 1 tablet (500 mg total) by mouth daily. 90 tablet 3   No current facility-administered medications for this visit.    Allergies as of 06/10/2023 - Review Complete 06/10/2023  Allergen Reaction Noted   Doxycycline Itching and Swelling 06/27/2012    Family History  Problem Relation Age of Onset   Aneurysm Mother        Cerebral   Hypertension Mother    Hyperlipidemia Mother    Stroke Mother    Depression Mother    Coronary artery disease Father    Hypertension Father    Hyperlipidemia Father    Heart attack Father        d. 63   Lung cancer Father        dx early 43s; former smoker   Lung cancer Sister        paternal half-sister; not a smoker; dx <60; d. 60y   Breast cancer Sister 40   Other Sister        hx of hysterectomy for  unspecified reason   Eczema Brother    Lung cancer Brother    Lung cancer Brother        d. 42y; smoker   Multiple myeloma Brother        d. 59s   Lung cancer Brother        dx late 36s; smoker   Arthritis Brother    Prostate cancer Brother        dx late 20s   Prostate cancer Brother        dx late 69s   Lung cancer Brother    Diabetes Brother    Leukemia Maternal Aunt        "blood cancer"; d. unspecified age   Lung cancer Maternal Aunt        d. unspecified age   Breast cancer Maternal Aunt        dx unspecified age   Cervical cancer Paternal Aunt        d. late 53s   Anxiety disorder Daughter    Prostate cancer Cousin        maternal 1st cousin; dx older age   Breast cancer Other        niece dx early 84s or before   Leukemia Other        nephew d. 2y; "blood cancer"   Colon cancer Neg Hx     Social History   Socioeconomic History   Marital status: Married    Spouse name: Peyton Najjar   Number of children: 4   Years of education: Not on file   Highest education level: 12th grade  Occupational History   Occupation: Marine scientist work    Associate Professor: Nurse, adult [employer]  Tobacco Use   Smoking status: Former    Current packs/day: 0.00    Average packs/day: 0.5 packs/day for 15.0 years (7.5 ttl pk-yrs)    Types: Cigarettes    Start date: 07/07/1967    Quit date: 07/06/1982    Years since quitting: 40.9   Smokeless tobacco: Never  Vaping Use   Vaping status: Never Used  Substance and Sexual Activity   Alcohol use: No   Drug use: No   Sexual activity: Yes    Partners: Male    Birth control/protection: Surgical    Comment: TAH  Other Topics Concern   Not on file  Social History Narrative   4 biological children   4 step children   13 grandchildren    1 great grandchildren   2 foster children   Social Determinants of Health   Financial Resource Strain: Low Risk  (01/11/2023)   Overall Financial Resource Strain (CARDIA)    Difficulty of Paying Living Expenses: Not very  hard  Food Insecurity: No Food Insecurity (01/11/2023)   Hunger Vital Sign    Worried About Running Out of Food in the Last Year: Never true    Ran Out of Food in the Last Year: Never true  Transportation Needs: No Transportation Needs (01/11/2023)   PRAPARE - Administrator, Civil Service (Medical): No    Lack of Transportation (Non-Medical): No  Physical Activity: Inactive (01/11/2023)   Exercise Vital Sign    Days of Exercise per Week: 0 days    Minutes of Exercise per Session: 10 min  Stress: No Stress Concern Present (10/26/2022)   [employer] of Occupational Health - Occupational Stress Questionnaire    Feeling of Stress : Only a little  Social Connections: Moderately Integrated (01/11/2023)   Social Connection and Isolation Panel [NHANES]    Frequency of Communication with Friends and Family: Never    Frequency of Social Gatherings with Friends and Family: Once a week    Attends Religious Services: 1 to 4 times per year    Active Member of [employer] or Organizations: Yes    Attends Banker Meetings: 1 to 4 times per year    Marital Status: Married    Subjective: Review of Systems  Constitutional:  Negative for chills and fever.  HENT:  Negative for congestion and hearing loss.   Eyes:  Negative for blurred vision and double vision.  Respiratory:  Negative for cough and shortness of breath.   Cardiovascular:  Negative for chest pain and palpitations.  Gastrointestinal:  Positive for constipation and heartburn. Negative for blood in stool, diarrhea, melena and vomiting.       Bloating  Genitourinary:  Negative for dysuria and urgency.  Musculoskeletal:  Negative for joint pain and myalgias.  Skin:  Negative for itching and rash.  Neurological:  Negative for dizziness and headaches.  Psychiatric/Behavioral:  Negative for depression. The patient is not nervous/anxious.      Objective: BP 114/63   Pulse (!) 55   Temp 97.9 F (36.6 C)   Ht 5\' 1"   (1.549 m)   Wt 135 lb 6.4 oz (61.4 kg)   LMP 12/04/2008   BMI 25.58 kg/m  Physical Exam Constitutional:      Appearance: Normal appearance.  HENT:     Head: Normocephalic and atraumatic.  Eyes:     Extraocular Movements: Extraocular movements intact.     Conjunctiva/sclera: Conjunctivae normal.  Cardiovascular:     Rate and Rhythm: Normal rate and regular rhythm.  Pulmonary:     Effort: Pulmonary effort is normal.     Breath sounds: Normal breath sounds.  Abdominal:     General: Bowel sounds are normal.     Palpations: Abdomen is soft.  Musculoskeletal:        General: No swelling. Normal range of motion.     Cervical back: Normal range  of motion and neck supple.  Skin:    General: Skin is warm and dry.     Coloration: Skin is not jaundiced.  Neurological:     General: No focal deficit present.     Mental Status: She is alert and oriented to person, place, and time.  Psychiatric:        Mood and Affect: Mood normal.        Behavior: Behavior normal.      Assessment/Plan:  1.  Chronic reflux-well-controlled on pantoprazole daily, Pepcid nightly.  Refill today  2.   MASLD-patient has lost nearly 40 pounds since initial diagnosis.  Congratulated her on this.  No longer requiring medications for diabetes.  Continue to monitor  3.  Family history of colon cancer in brother-will schedule for high risk screening colonoscopy today. The risks including infection, bleed, or perforation as well as benefits, limitations, alternatives and imponderables have been reviewed with the patient. Questions have been answered. All parties agreeable.  06/10/2023 2:50 PM   Disclaimer: This note was dictated with voice recognition software. Similar sounding words can inadvertently be transcribed and may not be corrected upon review.

## 2023-06-10 NOTE — Patient Instructions (Signed)
I am happy to hear that you are doing so well.  Continue on pantoprazole and Pepcid.  I have refilled both of these today.  We will schedule you for colonoscopy given your family history of colon cancer in brother.  Congratulations on your weight loss, that is fantastic.  I hope you have a fantastic Christmas.  Dr. Marletta Lor

## 2023-06-14 ENCOUNTER — Telehealth: Payer: Self-pay | Admitting: Family Medicine

## 2023-06-14 NOTE — Telephone Encounter (Signed)
Patient's spouse called stating the medication she was prescribed is not working for her. The medication that she was prescribed at her last visit is montelukast and cetirizine. Patient would like to know of an alternative that will work for her.

## 2023-06-14 NOTE — Telephone Encounter (Signed)
I called patient she said she is still waking up itching with montelukast and zyrtec. I asked if she has tried doubling her zyrtec patient said she has not. I informed per last AVS she can take an additional zyrtec if needed. Informed patient to try taking twice a day to see if it helps and if it doesn't work to give [place] a callback.

## 2023-06-15 ENCOUNTER — Encounter: Payer: Self-pay | Admitting: *Deleted

## 2023-06-18 ENCOUNTER — Ambulatory Visit
Admission: RE | Admit: 2023-06-18 | Discharge: 2023-06-18 | Disposition: A | Discharge status: Home / Self Care | Payer: HMO | Source: Ambulatory Visit | Attending: Family Medicine | Admitting: Family Medicine

## 2023-06-18 DIAGNOSIS — N6489 Other specified disorders of breast: Secondary | ICD-10-CM

## 2023-06-18 DIAGNOSIS — Z853 Personal history of malignant neoplasm of breast: Secondary | ICD-10-CM | POA: Diagnosis not present

## 2023-06-18 DIAGNOSIS — R928 Other abnormal and inconclusive findings on diagnostic imaging of breast: Secondary | ICD-10-CM | POA: Diagnosis not present

## 2023-07-04 ENCOUNTER — Other Ambulatory Visit: Payer: Self-pay | Admitting: Family Medicine

## 2023-07-04 DIAGNOSIS — F32A Depression, unspecified: Secondary | ICD-10-CM

## 2023-07-05 ENCOUNTER — Other Ambulatory Visit: Payer: Self-pay

## 2023-07-05 ENCOUNTER — Encounter: Payer: Self-pay | Admitting: Internal Medicine

## 2023-07-05 ENCOUNTER — Ambulatory Visit (INDEPENDENT_AMBULATORY_CARE_PROVIDER_SITE_OTHER): Payer: HMO | Admitting: Internal Medicine

## 2023-07-05 VITALS — BP 122/68 | HR 64 | Temp 98.4°F | Ht 60.63 in | Wt 139.8 lb

## 2023-07-05 DIAGNOSIS — L501 Idiopathic urticaria: Secondary | ICD-10-CM | POA: Diagnosis not present

## 2023-07-05 DIAGNOSIS — J302 Other seasonal allergic rhinitis: Secondary | ICD-10-CM

## 2023-07-05 DIAGNOSIS — J3089 Other allergic rhinitis: Secondary | ICD-10-CM | POA: Diagnosis not present

## 2023-07-05 MED ORDER — AZELASTINE HCL 0.1 % NA SOLN: 2.0000 | Freq: Two times a day (BID) | NASAL | 5 refills | Status: DC | PRN

## 2023-07-05 MED ORDER — MONTELUKAST SODIUM 10 MG PO TABS: 10.0000 mg | ORAL_TABLET | Freq: Every day | ORAL | 5 refills | Status: DC

## 2023-07-05 NOTE — Progress Notes (Signed)
FOLLOW UP Date of Service/Encounter:  07/05/23   Subjective:  [name] [name] (DOB: March 14, 1961) is a 62 y.o. female [facility] for follow up for allergic rhinitis and chronic urticaria/angioedema.   History obtained from: chart review and patient.  Last seen 05/26/2023 with Thermon Leyland for allergic rhinitis and hives/swelling. Discussed starting Singulair, Zyrtec BID.  Previously completed 5 years of AIT and on 01/2023 discussed AIT cessation with Dr Dellis Anes.   Since last visit, reports having trouble with hives and swelling.  Notices randomly occurring on legs/arms.  They are itchy and raised.  Sometimes with lip swelling also.  Has a prior history of this many years ago.  Also having frequent congestion and runny nose.  Taking Zyrtec 10mg  BID and Singulair daily.  Has tried Flonase in the past with minimal relief. Thinks all her symptoms have worsened since stopping the allergy shots and would like to restart.   Past Medical History: Past Medical History:  Diagnosis Date   Allergic rhinitis    Allergy    Anemia, unspecified 05/30/2013   Anxiety    Breast cancer (HCC) 03/31/2013   left   Cancer (HCC)    Chest pain 2009   Consultation-Rothbart, negative chest CT; nl echo in 2005; h/o palpitations   Chiari malformation type I (HCC)    s/p suboccipital craniectomy, C1 laminectomy, superior C2 laminectomy, duraplasty 05/10/14   Colitis 2010   not IBD   Degenerative joint disease    + degenerative joint disease of the lumbosacral spine   Depression    Diabetes (HCC)    Diabetes (HCC)    Dysrhythmia    palpations   Genetic testing 07/01/2016   GERD (gastroesophageal reflux disease)    "a little"   Headache    Heart murmur    "small"   Herpes simplex type II infection    Hypercholesteremia    "slightly high"   Hypertension    does not have high blood pressure   Meningitis 08/22/2014   Palpitations    Personal history of  chemotherapy    Personal history of radiation therapy    Urticaria    Wears glasses     Objective:  BP 122/68   Pulse 64   Temp 98.4 F (36.9 C) (Temporal)   Ht 5' 0.63" (1.54 m)   Wt 139 lb 12.8 oz (63.4 kg)   LMP 12/04/2008   SpO2 97%   BMI 26.74 kg/m  Body mass index is 26.74 kg/m. Physical Exam: GEN: alert, well developed HEENT: clear conjunctiva, nose with mild inferior turbinate hypertrophy, pink nasal mucosa, + clear rhinorrhea, no cobblestoning HEART: regular rate and rhythm, no murmur LUNGS: clear to auscultation bilaterally, no coughing, unlabored respiration SKIN: no rashes or lesions  Assessment:   1. Seasonal and perennial allergic rhinitis   2. Chronic idiopathic urticaria     Plan/Recommendations:  Allergic Rhinitis -Uncontrolled, would like to restart AIT.  - SPT 04/2022: positive to tree pollen, dust mite, and cockroach  - Consider saline nasal rinses as needed for nasal symptoms. Use this before any medicated nasal sprays for best result. - Use Azelastine 2 sprays twice daily as needed for congestion/drainage/sneezing/runny nose.  Aim upward and outward.  - Use Allegra 180mg  twice daily.  Dosing based on hives. - Use Singulair 10mg  daily.  - For eyes, use Olopatadine or Ketotifen 1 eye drop daily as needed for itchy, watery eyes.  Available over the counter, if not  covered by insurance.  - Please discuss the cost and coverage with insurance company.  If interested in restarting allergy shots, please call us back and Dr Dellis Anes will place the order for the vials.  Must bring Epipen for each shot visit.   Chronic Idiopathic Urticaria/Angioedema (Hives/Swelling): - Uncontrolled. Take pictures and videos of the rash/swelling. We did discuss AIT is not indicated for urticaria/angioedema and therefore, not clear if it would help with this.  In the meantime, start anti histamines discussed below.  - At this time etiology of hives and swelling is unknown.  Hives can be caused by a variety of different triggers including illness/infection, exercise, pressure, vibrations, extremes of temperature to name a few however majority of the time there is no identifiable trigger.    -Use Allegra (Fexofenadine) 180mg  twice daily.  This replaces Zyrtec.  -If no improvement in 1 weeks, add Pepcid 20mg  twice daily and continue Allegra 180mg  twice daily. -Continue Singulair 10mg  daily.          Return in about 3 months (around 10/03/2023).  Alesia Morin, MD [facility]

## 2023-07-05 NOTE — Patient Instructions (Addendum)
Allergic Rhinitis - SPT 04/2022: positive to tree pollen, dust mite, and cockroach  - Consider saline nasal rinses as needed for nasal symptoms. Use this before any medicated nasal sprays for best result. - Use Azelastine 2 sprays twice daily as needed for congestion/drainage/sneezing/runny nose.  Aim upward and outward.  - Use Allegra 180mg  twice daily.  Dosing based on hives. - Use Singulair 10mg  daily.  - For eyes, use Olopatadine or Ketotifen 1 eye drop daily as needed for itchy, watery eyes.  Available over the counter, if not covered by insurance.  - Please discuss the cost and coverage with insurance company.  If interested in restarting allergy shots, please call us back and Dr Dellis Anes will place the order for the vials.  Must bring Epipen for each shot visit.   Chronic Idiopathic Urticaria/Angioedema (Hives/Swelling): - Take pictures and videos of the rash/swelling.  - At this time etiology of hives and swelling is unknown. Hives can be caused by a variety of different triggers including illness/infection, exercise, pressure, vibrations, extremes of temperature to name a few however majority of the time there is no identifiable trigger.    -Use Allegra (Fexofenadine) 180mg  twice daily.  This replaces Zyrtec.  -If no improvement in 1 weeks, add Pepcid 20mg  twice daily and continue Allegra 180mg  twice daily. -Continue Singulair 10mg  daily.

## 2023-07-16 ENCOUNTER — Other Ambulatory Visit: Payer: Self-pay | Admitting: Family Medicine

## 2023-07-16 DIAGNOSIS — F32A Depression, unspecified: Secondary | ICD-10-CM

## 2023-07-22 ENCOUNTER — Encounter: Payer: Self-pay | Admitting: *Deleted

## 2023-07-28 ENCOUNTER — Telehealth: Payer: Self-pay | Admitting: Allergy & Immunology

## 2023-07-28 ENCOUNTER — Ambulatory Visit: Payer: HMO | Admitting: Allergy & Immunology

## 2023-07-28 NOTE — Telephone Encounter (Signed)
I will reach out to her pharmacy to initiate prior authorization if needed. For the most part most insurances don't require a prior authorization for allergy immunotherapy.   [name] [name] [phone]

## 2023-07-28 NOTE — Telephone Encounter (Signed)
Patient came in today stating she is wanting to start back allergy injections. Patient states she called her insurance and her insurance told her she is going to need a prior authorization in order for insurance to cover them.

## 2023-08-02 ENCOUNTER — Telehealth: Payer: Self-pay | Admitting: Internal Medicine

## 2023-08-02 NOTE — Telephone Encounter (Signed)
Pt called to schedule her colonoscopy. She received letter that we were trying to reach her to schedule. [phone]

## 2023-08-03 MED ORDER — PEG 3350-KCL-NA BICARB-NACL 420 G PO SOLR: 4000.0000 mL | Freq: Once | ORAL | 0 refills | Status: AC

## 2023-08-03 NOTE — Telephone Encounter (Signed)
Spoke with pt. Scheduled for 2/18 with Dr. Marletta Lor. Aware will send instructions via mychart. Rx for prep sent to pharmacy

## 2023-08-05 NOTE — Progress Notes (Signed)
VIALS EXP 08-04-24

## 2023-08-09 DIAGNOSIS — J3089 Other allergic rhinitis: Secondary | ICD-10-CM | POA: Diagnosis not present

## 2023-08-12 NOTE — Telephone Encounter (Signed)
 Called and spoke to [name] [name] at [facility] and she stated as long as she is coming to an in-network facility there is no prior authorization needed because she has an HMO. I called and informed [name] [name] of this information. I also let her know she already has an appointment scheduled for February 17 at our [place] location to restart her immunotherapy injections.   Crystalyn [phone]

## 2023-08-23 ENCOUNTER — Ambulatory Visit (INDEPENDENT_AMBULATORY_CARE_PROVIDER_SITE_OTHER): Payer: HMO

## 2023-08-23 DIAGNOSIS — J309 Allergic rhinitis, unspecified: Secondary | ICD-10-CM

## 2023-08-23 NOTE — Progress Notes (Signed)
Immunotherapy   Patient Details  Name: [name] [name] MRN: [id number] Date of Birth: [birth year]  08/23/2023  Nita Sells started injections for  dust mites, and trees for the second time.  Following schedule: A  Frequency:1 time per week Epi-Pen:Epi-Pen Available  Consent signed in office today and patient instructions given. Patient sat in room three for thirty minutes without an issue.   Ralene Muskrat 08/23/2023, 9:11 AM

## 2023-08-24 ENCOUNTER — Other Ambulatory Visit: Payer: Self-pay

## 2023-08-24 ENCOUNTER — Ambulatory Visit (HOSPITAL_COMMUNITY): Payer: HMO | Admitting: Anesthesiology

## 2023-08-24 ENCOUNTER — Encounter (HOSPITAL_COMMUNITY): Payer: Self-pay | Admitting: Internal Medicine

## 2023-08-24 ENCOUNTER — Encounter (HOSPITAL_COMMUNITY)
Admission: RE | Disposition: A | Discharge status: Home / Self Care | Payer: Self-pay | Source: Home / Self Care | Attending: Internal Medicine

## 2023-08-24 ENCOUNTER — Ambulatory Visit (HOSPITAL_COMMUNITY)
Admission: RE | Admit: 2023-08-24 | Discharge: 2023-08-24 | Disposition: A | Discharge status: Home / Self Care | Payer: HMO | Attending: Internal Medicine | Admitting: Internal Medicine

## 2023-08-24 DIAGNOSIS — Z87891 Personal history of nicotine dependence: Secondary | ICD-10-CM | POA: Diagnosis not present

## 2023-08-24 DIAGNOSIS — K219 Gastro-esophageal reflux disease without esophagitis: Secondary | ICD-10-CM | POA: Diagnosis not present

## 2023-08-24 DIAGNOSIS — Z1211 Encounter for screening for malignant neoplasm of colon: Secondary | ICD-10-CM | POA: Insufficient documentation

## 2023-08-24 DIAGNOSIS — I10 Essential (primary) hypertension: Secondary | ICD-10-CM

## 2023-08-24 DIAGNOSIS — K649 Unspecified hemorrhoids: Secondary | ICD-10-CM | POA: Diagnosis not present

## 2023-08-24 DIAGNOSIS — Z800 Family history of malignant neoplasm of digestive organs: Secondary | ICD-10-CM | POA: Insufficient documentation

## 2023-08-24 DIAGNOSIS — F419 Anxiety disorder, unspecified: Secondary | ICD-10-CM | POA: Insufficient documentation

## 2023-08-24 DIAGNOSIS — F32A Depression, unspecified: Secondary | ICD-10-CM | POA: Insufficient documentation

## 2023-08-24 DIAGNOSIS — E119 Type 2 diabetes mellitus without complications: Secondary | ICD-10-CM | POA: Insufficient documentation

## 2023-08-24 DIAGNOSIS — K648 Other hemorrhoids: Secondary | ICD-10-CM | POA: Insufficient documentation

## 2023-08-24 HISTORY — PX: COLONOSCOPY WITH PROPOFOL: SHX5780

## 2023-08-24 SURGERY — COLONOSCOPY WITH PROPOFOL
Anesthesia: General

## 2023-08-24 MED ORDER — LACTATED RINGERS IV SOLN: INTRAVENOUS | Status: DC

## 2023-08-24 MED ORDER — PROPOFOL 10 MG/ML IV BOLUS
INTRAVENOUS | Status: DC | PRN
Start: 2023-08-24 — End: 2023-08-24
  Administered 2023-08-24: 60 mg via INTRAVENOUS
  Administered 2023-08-24: 150 ug/kg/min via INTRAVENOUS

## 2023-08-24 MED ORDER — PROPOFOL 500 MG/50ML IV EMUL
INTRAVENOUS | Status: AC
  Filled 2023-08-24: qty 50

## 2023-08-24 MED ORDER — LACTATED RINGERS IV SOLN: INTRAVENOUS | Status: DC | PRN

## 2023-08-24 NOTE — Transfer of Care (Signed)
Immediate Anesthesia Transfer of Care Note  Patient: [name] [name]  Procedure(s) Performed: COLONOSCOPY WITH PROPOFOL  Patient Location: Endoscopy Unit  Anesthesia Type:General  Level of Consciousness: awake, alert , and oriented  Airway & Oxygen Therapy: Patient Spontanous Breathing  Post-op Assessment: Report given to RN, Post -op Vital signs reviewed and stable, Patient moving all extremities X 4, and Patient able to stick tongue midline  Post vital signs: Reviewed and stable  Last Vitals:  Vitals Value Taken Time  BP 98/64 08/24/23 0951  Temp 36.5 C 08/24/23 0951  Pulse 69 08/24/23 0951  Resp 23 08/24/23 0951  SpO2 100 % 08/24/23 0951    Last Pain:  Vitals:   08/24/23 0951  TempSrc: Oral  PainSc: 0-No pain      Patients Stated Pain Goal: 4 (08/24/23 0839)  Complications: There were no known notable events for this encounter.

## 2023-08-24 NOTE — Discharge Instructions (Addendum)
  Colonoscopy Discharge Instructions  Read the instructions outlined below and refer to this sheet in the next few weeks. These discharge instructions provide you with general information on caring for yourself after you leave the hospital. Your doctor may also give you specific instructions. While your treatment has been planned according to the most current medical practices available, unavoidable complications occasionally occur.   ACTIVITY You may resume your regular activity, but move at a slower pace for the next 24 hours.  Take frequent rest periods for the next 24 hours.  Walking will help get rid of the air and reduce the bloated feeling in your belly (abdomen).  No driving for 24 hours (because of the medicine (anesthesia) used during the test).   Do not sign any important legal documents or operate any machinery for 24 hours (because of the anesthesia used during the test).  NUTRITION Drink plenty of fluids.  You may resume your normal diet as instructed by your doctor.  Begin with a light meal and progress to your normal diet. Heavy or fried foods are harder to digest and may make you feel sick to your stomach (nauseated).  Avoid alcoholic beverages for 24 hours or as instructed.  MEDICATIONS You may resume your normal medications unless your doctor tells you otherwise.  WHAT YOU CAN EXPECT TODAY Some feelings of bloating in the abdomen.  Passage of more gas than usual.  Spotting of blood in your stool or on the toilet paper.  IF YOU HAD POLYPS REMOVED DURING THE COLONOSCOPY: No aspirin products for 7 days or as instructed.  No alcohol for 7 days or as instructed.  Eat a soft diet for the next 24 hours.  FINDING OUT THE RESULTS OF YOUR TEST Not all test results are available during your visit. If your test results are not back during the visit, make an appointment with your caregiver to find out the results. Do not assume everything is normal if you have not heard from your  caregiver or the medical facility. It is important for you to follow up on all of your test results.  SEEK IMMEDIATE MEDICAL ATTENTION IF: You have more than a spotting of blood in your stool.  Your belly is swollen (abdominal distention).  You are nauseated or vomiting.  You have a temperature over 101.  You have abdominal pain or discomfort that is severe or gets worse throughout the day.   Your colonoscopy was relatively unremarkable.  I did not find any polyps or evidence of colon cancer.  I recommend repeating colonoscopy in 5 years for colon cancer screening purposes given your family history. Follow-up with GI in 3-4 months   I hope you have a great rest of your week!  Hennie Duos. Marletta Lor, D.O. Gastroenterology and Hepatology [facility]

## 2023-08-24 NOTE — H&P (Signed)
Primary Care Physician:  Tommie Sams, DO Primary Gastroenterologist:  Dr. Marletta Lor  Pre-Procedure History & Physical: HPI:  [name] [name] is a 63 y.o. female is here for a colonoscopy to be performed for high risk colon cancer screening purposes, family history of colon cancer in brother  Past Medical History:  Diagnosis Date   Allergic rhinitis    Allergy    Anemia, unspecified 05/30/2013   Anxiety    Breast cancer (HCC) 03/31/2013   left   Cancer (HCC)    Chest pain 2009   Consultation-[facility], negative chest CT; nl echo in 2005; h/o palpitations   Chiari malformation type I (HCC)    s/p suboccipital craniectomy, C1 laminectomy, superior C2 laminectomy, duraplasty 05/10/14   Colitis 2010   not IBD   Degenerative joint disease    + degenerative joint disease of the lumbosacral spine   Depression    Diabetes (HCC)    Diabetes (HCC)    Dysrhythmia    palpations   Genetic testing 07/01/2016   GERD (gastroesophageal reflux disease)    "a little"   Headache    Heart murmur    "small"   Herpes simplex type II infection    Hypercholesteremia    "slightly high"   Hypertension    does not have high blood pressure   Meningitis 08/22/2014   Palpitations    Personal history of chemotherapy    Personal history of radiation therapy    Urticaria    Wears glasses     Past Surgical History:  Procedure Laterality Date   ABDOMINAL HYSTERECTOMY  03/13/2007   TAH ?BSO--Dr Cheron Every, [place]   BIOPSY  01/14/2021   Procedure: BIOPSY;  Surgeon: Lanelle Bal, DO;  Location: [facility];  Service: Endoscopy;;   BRAIN SURGERY     BREAST EXCISIONAL BIOPSY  04/2003   Left; benign disease   BREAST LUMPECTOMY Left 09/2013   BREAST LUMPECTOMY WITH NEEDLE LOCALIZATION AND AXILLARY LYMPH NODE DISSECTION Left 09/12/2013   Procedure: BREAST LUMPECTOMY WITH NEEDLE LOCALIZATION AND AXILLARY LYMPH NODE DISSECTION;  Surgeon: Kandis Cocking, MD;  Location: [facility];   Service: General;  Laterality: Left;  and axilla   BREAST SURGERY     COLONOSCOPY  10/2010   proctitis; melanosis coli   COLONOSCOPY WITH PROPOFOL N/A 01/04/2018   Dr. Darrick Penna: hemorrhoids, follow up colonoscopy in 10 years   EAR CYST EXCISION Right 09/04/2019   Procedure: EXCISION EAR MASS;  Surgeon: Newman Pies, MD;  Location: [facility];  Service: ENT;  Laterality: Right;   ESOPHAGOGASTRODUODENOSCOPY (EGD) WITH PROPOFOL N/A 01/14/2021   Procedure: ESOPHAGOGASTRODUODENOSCOPY (EGD) WITH PROPOFOL;  Surgeon: Lanelle Bal, DO;  Location: [facility];  Service: Endoscopy;  Laterality: N/A;  9:00am   EXCISION/RELEASE BURSA HIP Left 03/17/2019   Procedure: Left hip open bursectomy;  Surgeon: Yolonda Kida, MD;  Location: [facility];  Service: Orthopedics;  Laterality: Left;  75 mins   EYE SURGERY Left    "fix lazy eye"   PORTACATH PLACEMENT Right 04/21/2013   Procedure: INSERTION PORT-A-CATH;  Surgeon: Kandis Cocking, MD;  Location: [facility];  Service: General;  Laterality: Right;   RIGHT OOPHORECTOMY  03/13/2007   SHOULDER ARTHROSCOPY WITH DISTAL CLAVICLE RESECTION Right 10/09/2022   Procedure: SHOULDER ARTHROSCOPY WITH DISTAL CLAVICLE RESECTION;  Surgeon: Yolonda Kida, MD;  Location: [facility];  Service: Orthopedics;  Laterality: Right;  120   SHOULDER ARTHROSCOPY WITH ROTATOR CUFF REPAIR  AND SUBACROMIAL DECOMPRESSION Right 10/09/2022   Procedure: SHOULDER ARTHROSCOPY WITH ROTATOR CUFF REPAIR AND SUBACROMIAL DECOMPRESSION;  Surgeon: Yolonda Kida, MD;  Location: [facility];  Service: Orthopedics;  Laterality: Right;   SUBOCCIPITAL CRANIECTOMY CERVICAL LAMINECTOMY N/A 05/10/2014   Procedure: SUBOCCIPITAL CRANIECTOMY CERVICAL LAMINECTOMY/DURAPLASTY;  Surgeon: Hewitt Shorts, MD;  Location: [facility];  Service: Neurosurgery;  Laterality: N/A;  suboccipital craniectomy with cervical laminectomy and duraplasty   TUBAL LIGATION       Prior to Admission medications   Medication Sig Start Date End Date Taking? Authorizing Provider  atenolol (TENORMIN) 50 MG tablet TAKE 1 TABLET BY MOUTH EVERY DAY 03/03/23  Yes Cook, Jayce G, DO  azelastine (ASTELIN) 0.1 % nasal spray Place 2 sprays into both nostrils 2 (two) times daily as needed. Use in each nostril as directed 07/05/23  Yes Birder Robson, MD  cetirizine (ZYRTEC) 10 MG tablet Take 1 tablet (10 mg total) by mouth daily. 05/26/23  Yes Ambs, Norvel Richards, FNP  clonazePAM Scarlette Calico) 0.5 MG tablet Take 1/2-1 tab po once a day prn extreme anxiety 01/11/23  Yes Campbell Riches, NP  diphenhydramine-acetaminophen (TYLENOL PM) 25-500 MG TABS tablet Take 1 tablet by mouth at bedtime as needed (sleep).   Yes [provider]  escitalopram (LEXAPRO) 20 MG tablet TAKE 1 TABLET BY MOUTH EVERY DAY 07/05/23  Yes Cook, Jayce G, DO  famotidine (PEPCID) 40 MG tablet Take 1 tablet (40 mg total) by mouth at bedtime. 06/10/23 06/09/24 Yes Temple Sporer, Hennie Duos, DO  Melatonin 10 MG TABS Take 10 mg by mouth at bedtime.   Yes [provider]  montelukast (SINGULAIR) 10 MG tablet Take 1 tablet (10 mg total) by mouth at bedtime. 07/05/23  Yes Birder Robson, MD  pantoprazole (PROTONIX) 40 MG tablet Take 1 tablet (40 mg total) by mouth daily. 06/10/23 06/09/24 Yes Jaspal Pultz, Hennie Duos, DO  rosuvastatin (CRESTOR) 40 MG tablet Take 1 tablet (40 mg total) by mouth daily. 04/06/23  Yes Cook, Jayce G, DO  triamcinolone cream (KENALOG) 0.1 % Apply 1 Application topically 2 (two) times daily. Prn rash; use up to 2 weeks 01/11/23  Yes Campbell Riches, NP  valACYclovir (VALTREX) 500 MG tablet Take 1 tablet (500 mg total) by mouth daily. 10/20/22  Yes Cook, Jayce G, DO  EPINEPHrine 0.3 mg/0.3 mL IJ SOAJ injection Inject 0.3 mg into the muscle as needed for anaphylaxis. 05/26/23   Hetty Blend, FNP  potassium chloride SA (K-DUR,KLOR-CON) 20 MEQ tablet Take 1 tablet (20 mEq total) by mouth 2 (two) times daily.  07/31/13 08/07/13  Annamarie Dawley, MD    Allergies as of 08/03/2023 - Review Complete 07/05/2023  Allergen Reaction Noted   Doxycycline Itching and Swelling 06/27/2012    Family History  Problem Relation Age of Onset   Aneurysm Mother        Cerebral   Hypertension Mother    Hyperlipidemia Mother    Stroke Mother    Depression Mother    Coronary artery disease Father    Hypertension Father    Hyperlipidemia Father    Heart attack Father        d. 35   Lung cancer Father        dx early 73s; former smoker   Lung cancer Sister        paternal half-sister; not a smoker; dx <60; d. 60y   Breast cancer Sister 49   Other Sister  hx of hysterectomy for unspecified reason   Eczema Brother    Lung cancer Brother    Lung cancer Brother        d. 106y; smoker   Multiple myeloma Brother        d. 6s   Lung cancer Brother        dx late 62s; smoker   Arthritis Brother    Prostate cancer Brother        dx late 68s   Prostate cancer Brother        dx late 43s   Lung cancer Brother    Diabetes Brother    Leukemia Maternal Aunt        "blood cancer"; d. unspecified age   Lung cancer Maternal Aunt        d. unspecified age   Breast cancer Maternal Aunt        dx unspecified age   Cervical cancer Paternal Aunt        d. late 23s   Anxiety disorder Daughter    Prostate cancer Cousin        maternal 1st cousin; dx older age   Breast cancer Other        niece dx early 16s or before   Leukemia Other        nephew d. 2y; "blood cancer"   Colon cancer Neg Hx     Social History   Socioeconomic History   Marital status: Married    Spouse name: Peyton Najjar   Number of children: 4   Years of education: Not on file   Highest education level: 12th grade  Occupational History   Occupation: Marine scientist work    Associate Professor: Nurse, adult [employer]  Tobacco Use   Smoking status: Former    Current packs/day: 0.00    Average packs/day: 0.5 packs/day for 15.0 years (7.5 ttl pk-yrs)    Types:  Cigarettes    Start date: 07/07/1967    Quit date: 07/06/1982    Years since quitting: 41.1   Smokeless tobacco: Never  Vaping Use   Vaping status: Never Used  Substance and Sexual Activity   Alcohol use: No   Drug use: No   Sexual activity: Yes    Partners: Male    Birth control/protection: Surgical    Comment: TAH  Other Topics Concern   Not on file  Social History Narrative   4 biological children   4 step children   13 grandchildren    1 great grandchildren   2 foster children   Social Drivers of Corporate investment banker Strain: Low Risk  (01/11/2023)   Overall Financial Resource Strain (CARDIA)    Difficulty of Paying Living Expenses: Not very hard  Food Insecurity: No Food Insecurity (01/11/2023)   Hunger Vital Sign    Worried About Running Out of Food in the Last Year: Never true    Ran Out of Food in the Last Year: Never true  Transportation Needs: No Transportation Needs (01/11/2023)   PRAPARE - Administrator, Civil Service (Medical): No    Lack of Transportation (Non-Medical): No  Physical Activity: Inactive (01/11/2023)   Exercise Vital Sign    Days of Exercise per Week: 0 days    Minutes of Exercise per Session: 10 min  Stress: No Stress Concern Present (10/26/2022)   [employer] of Occupational Health - Occupational Stress Questionnaire    Feeling of Stress : Only a little  Social Connections: Moderately Integrated (01/11/2023)  Social Connection and Isolation Panel [NHANES]    Frequency of Communication with Friends and Family: Never    Frequency of Social Gatherings with Friends and Family: Once a week    Attends Religious Services: 1 to 4 times per year    Active Member of Clubs or Organizations: Yes    Attends Banker Meetings: 1 to 4 times per year    Marital Status: Married  Catering manager Violence: Not At Risk (10/26/2022)   Humiliation, Afraid, Rape, and Kick questionnaire    Fear of Current or Ex-Partner: No     Emotionally Abused: No    Physically Abused: No    Sexually Abused: No    Review of Systems: See HPI, otherwise negative ROS  Physical Exam: Vital signs in last 24 hours: Temp:  [98.2 F (36.8 C)] 98.2 F (36.8 C) (02/18 0839) Pulse Rate:  [68] 68 (02/18 0839) Resp:  [16] 16 (02/18 0839) BP: (136)/(83) 136/83 (02/18 0839) SpO2:  [99 %] 99 % (02/18 0839) Weight:  [63.5 kg] 63.5 kg (02/18 0839)   General:   Alert,  Well-developed, well-nourished, pleasant and cooperative in NAD Head:  Normocephalic and atraumatic. Eyes:  Sclera clear, no icterus.   Conjunctiva pink. Ears:  Normal auditory acuity. Nose:  No deformity, discharge,  or lesions. Mouth:  No deformity or lesions, dentition normal. Neck:  Supple; no masses or thyromegaly. Lungs:  Clear throughout to auscultation.   No wheezes, crackles, or rhonchi. No acute distress. Heart:  Regular rate and rhythm; no murmurs, clicks, rubs,  or gallops. Abdomen:  Soft, nontender and nondistended. No masses, hepatosplenomegaly or hernias noted. Normal bowel sounds, without guarding, and without rebound.   Msk:  Symmetrical without gross deformities. Normal posture. Extremities:  Without clubbing or edema. Neurologic:  Alert and  oriented x4;  grossly normal neurologically. Skin:  Intact without significant lesions or rashes. Cervical Nodes:  No significant cervical adenopathy. Psych:  Alert and cooperative. Normal mood and affect.  Impression/Plan: [name] [name] is here for a colonoscopy to be performed for high risk colon cancer screening purposes, family history of colon cancer in brother  The risks of the procedure including infection, bleed, or perforation as well as benefits, limitations, alternatives and imponderables have been reviewed with the patient. Questions have been answered. All parties agreeable.

## 2023-08-24 NOTE — Anesthesia Preprocedure Evaluation (Signed)
Anesthesia Evaluation  Patient identified by MRN, date of birth, ID band Patient awake    Reviewed: Allergy & Precautions, H&P , NPO status , Patient's Chart, lab work & pertinent test results, reviewed documented beta blocker date and time   Airway Mallampati: II  TM Distance: >3 FB Neck ROM: full    Dental no notable dental hx.    Pulmonary neg pulmonary ROS, former smoker   Pulmonary exam normal breath sounds clear to auscultation       Cardiovascular Exercise Tolerance: Good hypertension, negative cardio ROS + dysrhythmias + Valvular Problems/Murmurs  Rhythm:regular Rate:Normal     Neuro/Psych  Headaches PSYCHIATRIC DISORDERS Anxiety Depression    negative neurological ROS  negative psych ROS   GI/Hepatic negative GI ROS, Neg liver ROS,GERD  ,,  Endo/Other  negative endocrine ROSdiabetes    Renal/GU negative Renal ROS  negative genitourinary   Musculoskeletal   Abdominal   Peds  Hematology negative hematology ROS (+) Blood dyscrasia, anemia   Anesthesia Other Findings   Reproductive/Obstetrics negative OB ROS                             Anesthesia Physical Anesthesia Plan  ASA: 3  Anesthesia Plan: General   Post-op Pain Management:    Induction:   PONV Risk Score and Plan: Propofol infusion  Airway Management Planned:   Additional Equipment:   Intra-op Plan:   Post-operative Plan:   Informed Consent: I have reviewed the patients History and Physical, chart, labs and discussed the procedure including the risks, benefits and alternatives for the proposed anesthesia with the patient or authorized representative who has indicated his/her understanding and acceptance.     Dental Advisory Given  Plan Discussed with: CRNA  Anesthesia Plan Comments:        Anesthesia Quick Evaluation

## 2023-08-24 NOTE — Op Note (Signed)
[facility] Patient Name: [name] [name] Procedure Date: 08/24/2023 9:22 AM MRN: [id number] Date of Birth: March 05, 1961 Attending MD: Hennie Duos. Marletta Lor , [place], [id number] CSN: [id number] Age: 63 Admit Type: Outpatient Procedure:                Colonoscopy Indications:              Colon cancer screening in patient at increased                            risk: Colorectal cancer in brother Providers:                Hennie Duos. Marletta Lor, DO, Tammy Vaught, RN, Elinor Parkinson Referring MD:              Medicines:                See the Anesthesia note for documentation of the                            administered medications Complications:            No immediate complications. Estimated Blood Loss:     Estimated blood loss: none. Procedure:                Pre-Anesthesia Assessment:                           - The anesthesia plan was to use monitored                            anesthesia care (MAC).                           After obtaining informed consent, the colonoscope                            was passed under direct vision. Throughout the                            procedure, the patient's blood pressure, pulse, and                            oxygen saturations were monitored continuously. The                            [id number]-HQ190L ([id number]) scope was introduced through                            the anus and advanced to the the terminal ileum,                            with identification of the appendiceal orifice and                            IC valve. The colonoscopy was performed  without                            difficulty. The patient tolerated the procedure                            well. The quality of the bowel preparation was                            evaluated using the BBPS [facility] Bowel Preparation                            Scale) with scores of: Right Colon = 3, Transverse                            Colon = 3 and Left Colon = 3  (entire mucosa seen                            well with no residual staining, small fragments of                            stool or opaque liquid). The total BBPS score                            equals 9. Scope In: 9:38:31 AM Scope Out: 9:47:54 AM Scope Withdrawal Time: 0 hours 7 minutes 37 seconds  Total Procedure Duration: 0 hours 9 minutes 23 seconds  Findings:      Non-bleeding internal hemorrhoids were found.      The terminal ileum appeared normal.      The colon (entire examined portion) appeared normal. Impression:               - Non-bleeding internal hemorrhoids.                           - The examined portion of the ileum was normal.                           - The entire examined colon is normal.                           - No specimens collected. Moderate Sedation:      Per Anesthesia Care Recommendation:           - Patient has a contact number available for                            emergencies. The signs and symptoms of potential                            delayed complications were discussed with the                            patient. Return to normal activities tomorrow.  Written discharge instructions were provided to the                            patient.                           - Resume previous diet.                           - Continue present medications.                           - Repeat colonoscopy in 5 years for screening                            purposes/family history of colon cancer in brother.                           - Return to GI clinic in 4 months. Procedure Code(s):        --- Professional ---                           G9562, Colorectal cancer screening; colonoscopy on                            individual at high risk Diagnosis Code(s):        --- Professional ---                           Z80.0, Family history of malignant neoplasm of                            digestive organs                            K64.8, Other hemorrhoids CPT copyright 2022 American Medical Association. All rights reserved. The codes documented in this report are preliminary and upon coder review may  be revised to meet current compliance requirements. Hennie Duos. Marletta Lor, DO Hennie Duos. Marletta Lor, DO 08/24/2023 9:53:24 AM This report has been signed electronically. Number of Addenda: 0

## 2023-08-25 ENCOUNTER — Encounter (HOSPITAL_COMMUNITY): Payer: Self-pay | Admitting: Internal Medicine

## 2023-08-27 NOTE — Anesthesia Postprocedure Evaluation (Signed)
Anesthesia Post Note  Patient: [name] [name]  Procedure(s) Performed: COLONOSCOPY WITH PROPOFOL  Patient location during evaluation: Phase II Anesthesia Type: General Level of consciousness: awake Pain management: pain level controlled Vital Signs Assessment: post-procedure vital signs reviewed and stable Respiratory status: spontaneous breathing and respiratory function stable Cardiovascular status: blood pressure returned to baseline and stable Postop Assessment: no headache and no apparent nausea or vomiting Anesthetic complications: no Comments: Late entry   There were no known notable events for this encounter.   Last Vitals:  Vitals:   08/24/23 0839 08/24/23 0951  BP: 136/83 98/64  Pulse: 68 69  Resp: 16 (!) 23  Temp: 36.8 C 36.5 C  SpO2: 99% 100%    Last Pain:  Vitals:   08/24/23 0951  TempSrc: Oral  PainSc: 0-No pain                 Windell Norfolk

## 2023-08-29 ENCOUNTER — Other Ambulatory Visit: Payer: Self-pay | Admitting: Family Medicine

## 2023-08-29 DIAGNOSIS — N3090 Cystitis, unspecified without hematuria: Secondary | ICD-10-CM

## 2023-08-30 ENCOUNTER — Ambulatory Visit (INDEPENDENT_AMBULATORY_CARE_PROVIDER_SITE_OTHER): Payer: HMO | Admitting: Family Medicine

## 2023-08-30 ENCOUNTER — Encounter: Payer: Self-pay | Admitting: Family Medicine

## 2023-08-30 ENCOUNTER — Other Ambulatory Visit: Payer: Self-pay | Admitting: Family Medicine

## 2023-08-30 VITALS — BP 110/56 | HR 52 | Temp 97.2°F | Ht 61.0 in | Wt 142.0 lb

## 2023-08-30 DIAGNOSIS — Z130 Encounter for screening for diseases of the blood and blood-forming organs and certain disorders involving the immune mechanism: Secondary | ICD-10-CM

## 2023-08-30 DIAGNOSIS — I10 Essential (primary) hypertension: Secondary | ICD-10-CM

## 2023-08-30 DIAGNOSIS — E119 Type 2 diabetes mellitus without complications: Secondary | ICD-10-CM

## 2023-08-30 DIAGNOSIS — E785 Hyperlipidemia, unspecified: Secondary | ICD-10-CM | POA: Diagnosis not present

## 2023-08-30 DIAGNOSIS — E1169 Type 2 diabetes mellitus with other specified complication: Secondary | ICD-10-CM | POA: Diagnosis not present

## 2023-08-30 MED ORDER — ATENOLOL 25 MG PO TABS: 25.0000 mg | ORAL_TABLET | Freq: Every day | ORAL | 3 refills | Status: DC

## 2023-08-30 NOTE — Progress Notes (Signed)
 Subjective:  Patient ID: [name] [name], female    DOB: October 19, 1960  Age: 63 y.o. MRN: [id number]  CC:   Chief Complaint  Patient presents with   Diabetes   Hypertension    HPI:  63 year old female with the below mentioned medical problems presents for follow-up.   Patient reports that she is having a lot of anxiety and stress.  She is having some ongoing personal issues in regards to her son.  Her father was recently placed on hospice care as well.  Hypertension is well-controlled.  However, patient bradycardic.  Will discuss decrease and atenolol today.  Patient reports that she is concerned about low sugar readings.  Now off all pharmacotherapy regarding type 2 diabetes.  Last A1c was 5.6.  Patient Active Problem List   Diagnosis Date Noted   Anxiety and depression 07/27/2022   Allergic rhinitis 07/15/2021   NAFLD (nonalcoholic fatty liver disease) 65/78/4696   Psychophysiological insomnia 08/23/2020   Hyperlipidemia associated with type 2 diabetes mellitus (HCC) 03/22/2017   Diabetes (HCC)    IBS (irritable bowel syndrome) 12/10/2015   History of Chiari malformation 08/23/2014   Malignant neoplasm of upper-outer quadrant of left breast in female, estrogen receptor positive (HCC) 04/06/2013   Obesity (BMI 30-39.9) 12/14/2012   GERD (gastroesophageal reflux disease) 12/14/2012   Essential hypertension 09/02/2012    Social Hx   Social History   Socioeconomic History   Marital status: Married    Spouse name: Peyton Najjar   Number of children: 4   Years of education: Not on file   Highest education level: 12th grade  Occupational History   Occupation: Marine scientist work    Associate Professor: Nurse, adult [employer]  Tobacco Use   Smoking status: Former    Current packs/day: 0.00    Average packs/day: 0.5 packs/day for 15.0 years (7.5 ttl pk-yrs)    Types: Cigarettes    Start date: 07/07/1967    Quit date: 07/06/1982    Years since quitting: 41.1    Passive exposure: Never   Smokeless  tobacco: Never  Vaping Use   Vaping status: Never Used  Substance and Sexual Activity   Alcohol use: No   Drug use: No   Sexual activity: Yes    Partners: Male    Birth control/protection: Surgical    Comment: TAH  Other Topics Concern   Not on file  Social History Narrative   4 biological children   4 step children   13 grandchildren    1 great grandchildren   2 foster children   Social Drivers of Corporate investment banker Strain: Low Risk  (01/11/2023)   Overall Financial Resource Strain (CARDIA)    Difficulty of Paying Living Expenses: Not very hard  Food Insecurity: No Food Insecurity (08/29/2023)   Hunger Vital Sign    Worried About Running Out of Food in the Last Year: Never true    Ran Out of Food in the Last Year: Never true  Transportation Needs: No Transportation Needs (08/29/2023)   PRAPARE - Transportation    Lack of Transportation (Medical): No    Lack of Transportation (Non-Medical): No  Physical Activity: Insufficiently Active (08/29/2023)   Exercise Vital Sign    Days of Exercise per Week: 1 day    Minutes of Exercise per Session: 10 min  Stress: Stress Concern Present (08/29/2023)   [employer] of Occupational Health - Occupational Stress Questionnaire    Feeling of Stress : To some extent  Social Connections: Moderately Integrated (08/29/2023)  Social Advertising account executive [NHANES]    Frequency of Communication with Friends and Family: Once a week    Frequency of Social Gatherings with Friends and Family: Once a week    Attends Religious Services: More than 4 times per year    Active Member of [employer] or Organizations: Yes    Attends Banker Meetings: 1 to 4 times per year    Marital Status: Married    Review of Systems Per HPI  Objective:  BP (!) 110/56   Pulse (!) 52   Temp (!) 97.2 F (36.2 C)   Ht 5\' 1"  (1.549 m)   Wt 142 lb (64.4 kg)   LMP 12/04/2008   SpO2 100%   BMI 26.83 kg/m      08/30/2023    9:40 AM  08/24/2023    9:51 AM 08/24/2023    8:39 AM  BP/Weight  Systolic BP 110 98 136  Diastolic BP 56 64 83  Wt. (Lbs) 142  140  BMI 26.83 kg/m2  26.45 kg/m2    Physical Exam Vitals and nursing note reviewed.  Constitutional:      General: She is not in acute distress.    Appearance: Normal appearance.  HENT:     Head: Normocephalic and atraumatic.  Eyes:     General:        Right eye: No discharge.        Left eye: No discharge.     Conjunctiva/sclera: Conjunctivae normal.  Cardiovascular:     Rate and Rhythm: Normal rate and regular rhythm.  Pulmonary:     Effort: Pulmonary effort is normal.     Breath sounds: Normal breath sounds. No wheezing, rhonchi or rales.  Neurological:     Mental Status: She is alert.  Psychiatric:        Mood and Affect: Mood normal.        Behavior: Behavior normal.     Lab Results  Component Value Date   WBC 8.7 08/12/2022   HGB 12.7 08/12/2022   HCT 37.5 08/12/2022   PLT 252 08/12/2022   GLUCOSE 80 10/07/2022   CHOL 171 02/26/2023   TRIG 67 02/26/2023   HDL 72 02/26/2023   LDLCALC 86 02/26/2023   ALT 15 08/12/2022   AST 19 08/12/2022   NA 141 10/07/2022   K 4.2 10/07/2022   CL 106 10/07/2022   CREATININE 0.97 10/07/2022   BUN 7 (L) 10/07/2022   CO2 27 10/07/2022   TSH 1.990 05/08/2020   INR 1.0 06/06/2008   HGBA1C 5.6 02/26/2023     Assessment & Plan:  Type 2 diabetes mellitus without complication, without long-term current use of insulin (HCC) Assessment & Plan: Advised regular meals with adequate protein.  A1c today.  Currently off all pharmacotherapy regarding type 2 diabetes.  Orders: -     CMP14+EGFR -     Hemoglobin A1c -     Microalbumin / creatinine urine ratio  Hyperlipidemia associated with type 2 diabetes mellitus (HCC) Assessment & Plan: Lipid panel today to assess.  Continue Crestor.  Orders: -     Lipid panel  Screening for deficiency anemia -     CBC  Essential hypertension Assessment &  Plan: Continue atenolol.  Decreasing dose due to bradycardia.   Orders: -     Atenolol; Take 1 tablet (25 mg total) by mouth daily.  Dispense: 90 tablet; Refill: 3    Follow-up:  Return in about 6 months (around 02/27/2024).  Everlene Other DO [facility]

## 2023-08-30 NOTE — Assessment & Plan Note (Signed)
 Continue atenolol.  Decreasing dose due to bradycardia.

## 2023-08-30 NOTE — Assessment & Plan Note (Addendum)
 Advised regular meals with adequate protein.  A1c today.  Currently off all pharmacotherapy regarding type 2 diabetes.

## 2023-08-30 NOTE — Patient Instructions (Signed)
 Protein with each meal.  Consider counseling for your current life stressors.  Atenolol lowered.  Follow up in 6 months.  Labs today.

## 2023-08-30 NOTE — Assessment & Plan Note (Signed)
Lipid panel today to assess.  Continue Crestor. 

## 2023-08-31 LAB — LIPID PANEL
Chol/HDL Ratio: 2.3 {ratio} (ref 0.0–4.4)
Cholesterol, Total: 180 mg/dL (ref 100–199)
HDL: 79 mg/dL (ref >39)
LDL Chol Calc (NIH): 86 mg/dL (ref 0–99)
Triglycerides: 83 mg/dL (ref 0–149)
VLDL Cholesterol Cal: 15 mg/dL (ref 5–40)

## 2023-08-31 LAB — CBC
Hematocrit: 42.2 % (ref 34.0–46.6)
Hemoglobin: 13.7 g/dL (ref 11.1–15.9)
MCH: 27.1 pg (ref 26.6–33.0)
MCHC: 32.5 g/dL (ref 31.5–35.7)
MCV: 84 fL (ref 79–97)
Platelets: 191 10*3/uL (ref 150–450)
RBC: 5.05 x10E6/uL (ref 3.77–5.28)
RDW: 13.9 % (ref 11.7–15.4)
WBC: 7.8 10*3/uL (ref 3.4–10.8)

## 2023-08-31 LAB — CMP14+EGFR
ALT: 39 [IU]/L — ABNORMAL HIGH (ref 0–32)
AST: 31 [IU]/L (ref 0–40)
Albumin: 4.4 g/dL (ref 3.9–4.9)
Alkaline Phosphatase: 81 [IU]/L (ref 44–121)
BUN/Creatinine Ratio: 14 (ref 12–28)
BUN: 12 mg/dL (ref 8–27)
Bilirubin Total: 0.3 mg/dL (ref 0.0–1.2)
CO2: 20 mmol/L (ref 20–29)
Calcium: 9.2 mg/dL (ref 8.7–10.3)
Chloride: 106 mmol/L (ref 96–106)
Creatinine, Ser: 0.87 mg/dL (ref 0.57–1.00)
Globulin, Total: 2.8 g/dL (ref 1.5–4.5)
Glucose: 84 mg/dL (ref 70–99)
Potassium: 4.6 mmol/L (ref 3.5–5.2)
Sodium: 144 mmol/L (ref 134–144)
Total Protein: 7.2 g/dL (ref 6.0–8.5)
eGFR: 75 mL/min/{1.73_m2} (ref >59)

## 2023-08-31 LAB — MICROALBUMIN / CREATININE URINE RATIO
Creatinine, Urine: 46.4 mg/dL
Microalb/Creat Ratio: 14 mg/g{creat} (ref 0–29)
Microalbumin, Urine: 6.4 ug/mL

## 2023-08-31 LAB — HEMOGLOBIN A1C
Est. average glucose Bld gHb Est-mCnc: 126 mg/dL
Hgb A1c MFr Bld: 6 % — ABNORMAL HIGH (ref 4.8–5.6)

## 2023-09-02 ENCOUNTER — Ambulatory Visit (HOSPITAL_COMMUNITY)
Admission: RE | Admit: 2023-09-02 | Discharge: 2023-09-02 | Disposition: A | Discharge status: Home / Self Care | Payer: HMO | Source: Ambulatory Visit | Attending: Urology | Admitting: Urology

## 2023-09-02 DIAGNOSIS — R319 Hematuria, unspecified: Secondary | ICD-10-CM | POA: Diagnosis not present

## 2023-09-02 DIAGNOSIS — N3001 Acute cystitis with hematuria: Secondary | ICD-10-CM | POA: Insufficient documentation

## 2023-09-05 ENCOUNTER — Encounter: Payer: Self-pay | Admitting: Family Medicine

## 2023-09-06 ENCOUNTER — Ambulatory Visit: Payer: HMO | Admitting: Urology

## 2023-09-06 ENCOUNTER — Ambulatory Visit (INDEPENDENT_AMBULATORY_CARE_PROVIDER_SITE_OTHER): Payer: Self-pay

## 2023-09-06 ENCOUNTER — Other Ambulatory Visit: Payer: Self-pay

## 2023-09-06 DIAGNOSIS — E7800 Pure hypercholesterolemia, unspecified: Secondary | ICD-10-CM

## 2023-09-06 DIAGNOSIS — J309 Allergic rhinitis, unspecified: Secondary | ICD-10-CM | POA: Diagnosis not present

## 2023-09-06 MED ORDER — EZETIMIBE 10 MG PO TABS
10.0000 mg | ORAL_TABLET | Freq: Every day | ORAL | 3 refills | Status: AC
Start: 2023-09-06 — End: ?

## 2023-09-07 ENCOUNTER — Other Ambulatory Visit: Payer: Self-pay | Admitting: Nurse Practitioner

## 2023-09-15 ENCOUNTER — Ambulatory Visit (INDEPENDENT_AMBULATORY_CARE_PROVIDER_SITE_OTHER): Payer: Self-pay

## 2023-09-15 DIAGNOSIS — J309 Allergic rhinitis, unspecified: Secondary | ICD-10-CM

## 2023-09-24 ENCOUNTER — Ambulatory Visit: Payer: HMO | Admitting: Urology

## 2023-09-24 ENCOUNTER — Ambulatory Visit (INDEPENDENT_AMBULATORY_CARE_PROVIDER_SITE_OTHER): Payer: Self-pay

## 2023-09-24 VITALS — BP 108/61 | HR 62

## 2023-09-24 DIAGNOSIS — N3001 Acute cystitis with hematuria: Secondary | ICD-10-CM | POA: Diagnosis not present

## 2023-09-24 DIAGNOSIS — R351 Nocturia: Secondary | ICD-10-CM

## 2023-09-24 DIAGNOSIS — R350 Frequency of micturition: Secondary | ICD-10-CM

## 2023-09-24 DIAGNOSIS — R82998 Other abnormal findings in urine: Secondary | ICD-10-CM

## 2023-09-24 DIAGNOSIS — J309 Allergic rhinitis, unspecified: Secondary | ICD-10-CM | POA: Diagnosis not present

## 2023-09-24 LAB — URINALYSIS, ROUTINE W REFLEX MICROSCOPIC
Bilirubin, UA: NEGATIVE
Glucose, UA: NEGATIVE
Ketones, UA: NEGATIVE
Nitrite, UA: NEGATIVE
Protein,UA: NEGATIVE
Specific Gravity, UA: 1.025 (ref 1.005–1.030)
Urobilinogen, Ur: 1 mg/dL (ref 0.2–1.0)
pH, UA: 6 (ref 5.0–7.5)

## 2023-09-24 LAB — MICROSCOPIC EXAMINATION

## 2023-09-24 NOTE — Progress Notes (Signed)
 09/24/2023 10:57 AM   [name] [name] [birth year] [id number]  Referring provider: Tommie Sams, DO [address] [name],  [place] [address]  Frequent UTI   HPI: [name] [name] is a 62yo here for followup for microhematuria and concern for UTI. UA today shows 3-10 RBC/hpf. She has increased urinary frequency over the past 6 months. She drinks 2-3 cups of coffee daily. Nocturia 1-2x.    PMH: Past Medical History:  Diagnosis Date   Allergic rhinitis    Allergy    Anemia, unspecified 05/30/2013   Anxiety    Breast cancer (HCC) 03/31/2013   left   Cancer (HCC)    Chest pain 2009   Consultation-[facility], negative chest CT; nl echo in 2005; h/o palpitations   Chiari malformation type I (HCC)    s/p suboccipital craniectomy, C1 laminectomy, superior C2 laminectomy, duraplasty 05/10/14   Colitis 2010   not IBD   Degenerative joint disease    + degenerative joint disease of the lumbosacral spine   Depression    Diabetes (HCC)    Diabetes (HCC)    Dysrhythmia    palpations   Genetic testing 07/01/2016   GERD (gastroesophageal reflux disease)    "a little"   Headache    Heart murmur    "small"   Herpes simplex type II infection    Hypercholesteremia    "slightly high"   Hypertension    does not have high blood pressure   Meningitis 08/22/2014   Palpitations    Personal history of chemotherapy    Personal history of radiation therapy    Urticaria    Wears glasses     Surgical History: Past Surgical History:  Procedure Laterality Date   ABDOMINAL HYSTERECTOMY  03/13/2007   TAH ?BSO--Dr Cheron Every,    BIOPSY  01/14/2021   Procedure: BIOPSY;  Surgeon: Lanelle Bal, DO;  [facility]: [facility];  Service: Endoscopy;;   BRAIN SURGERY     BREAST EXCISIONAL BIOPSY  04/2003   Left; benign disease   BREAST LUMPECTOMY Left 09/2013   BREAST LUMPECTOMY WITH NEEDLE LOCALIZATION AND AXILLARY LYMPH NODE DISSECTION Left 09/12/2013   Procedure: BREAST  LUMPECTOMY WITH NEEDLE LOCALIZATION AND AXILLARY LYMPH NODE DISSECTION;  Surgeon: Kandis Cocking, MD;  Location: [facility];  Service: General;  Laterality: Left;  and axilla   BREAST SURGERY     COLONOSCOPY  10/2010   proctitis; melanosis coli   COLONOSCOPY WITH PROPOFOL N/A 01/04/2018   Dr. Darrick Penna: hemorrhoids, follow up colonoscopy in 10 years   COLONOSCOPY WITH PROPOFOL N/A 08/24/2023   Procedure: COLONOSCOPY WITH PROPOFOL;  Surgeon: Lanelle Bal, DO;  Location: [facility];  Service: Endoscopy;  Laterality: N/A;  10:00AM, ASA 2   EAR CYST EXCISION Right 09/04/2019   Procedure: EXCISION EAR MASS;  Surgeon: Newman Pies, MD;  Location: [facility];  Service: ENT;  Laterality: Right;   ESOPHAGOGASTRODUODENOSCOPY (EGD) WITH PROPOFOL N/A 01/14/2021   Procedure: ESOPHAGOGASTRODUODENOSCOPY (EGD) WITH PROPOFOL;  Surgeon: Lanelle Bal, DO;  Location: [facility];  Service: Endoscopy;  Laterality: N/A;  9:00am   EXCISION/RELEASE BURSA HIP Left 03/17/2019   Procedure: Left hip open bursectomy;  Surgeon: Yolonda Kida, MD;  Location: [facility];  Service: Orthopedics;  Laterality: Left;  75 mins   EYE SURGERY Left    "fix lazy eye"   PORTACATH PLACEMENT Right 04/21/2013   Procedure: INSERTION PORT-A-CATH;  Surgeon: Kandis Cocking, MD;  Location: [facility];  Service: General;  Laterality: Right;   RIGHT OOPHORECTOMY  03/13/2007   SHOULDER ARTHROSCOPY WITH DISTAL CLAVICLE RESECTION Right 10/09/2022   Procedure: SHOULDER ARTHROSCOPY WITH DISTAL CLAVICLE RESECTION;  Surgeon: Yolonda Kida, MD;  Location: [facility];  Service: Orthopedics;  Laterality: Right;  120   SHOULDER ARTHROSCOPY WITH ROTATOR CUFF REPAIR AND SUBACROMIAL DECOMPRESSION Right 10/09/2022   Procedure: SHOULDER ARTHROSCOPY WITH ROTATOR CUFF REPAIR AND SUBACROMIAL DECOMPRESSION;  Surgeon: Yolonda Kida, MD;  Location:  SURGERY CENTER;  Service: Orthopedics;   Laterality: Right;   SUBOCCIPITAL CRANIECTOMY CERVICAL LAMINECTOMY N/A 05/10/2014   Procedure: SUBOCCIPITAL CRANIECTOMY CERVICAL LAMINECTOMY/DURAPLASTY;  Surgeon: Hewitt Shorts, MD;  Location: [facility];  Service: Neurosurgery;  Laterality: N/A;  suboccipital craniectomy with cervical laminectomy and duraplasty   TUBAL LIGATION      Home Medications:  Allergies as of 09/24/2023       Reactions   Doxycycline Itching, Swelling        Medication List        Accurate as of September 24, 2023 10:57 AM. If you have any questions, ask your nurse or doctor.          atenolol 25 MG tablet Commonly known as: TENORMIN Take 1 tablet (25 mg total) by mouth daily.   azelastine 0.1 % nasal spray Commonly known as: ASTELIN Place 2 sprays into both nostrils 2 (two) times daily as needed. Use in each nostril as directed   buPROPion 150 MG 24 hr tablet Commonly known as: WELLBUTRIN XL Take 150 mg by mouth daily.   cetirizine 10 MG tablet Commonly known as: ZYRTEC TAKE 1 TABLET BY MOUTH EVERY DAY   clonazePAM 0.5 MG tablet Commonly known as: KLONOPIN TAKE HALF TO 1 TABLET BY MOUTH AS NEEDED AS NEEDED FOR EXTREME ANXIETY   diphenhydramine-acetaminophen 25-500 MG Tabs tablet Commonly known as: TYLENOL PM Take 1 tablet by mouth at bedtime as needed (sleep).   EPINEPHrine 0.3 mg/0.3 mL Soaj injection Commonly known as: EPI-PEN Inject 0.3 mg into the muscle as needed for anaphylaxis.   escitalopram 20 MG tablet Commonly known as: LEXAPRO TAKE 1 TABLET BY MOUTH EVERY DAY   ezetimibe 10 MG tablet Commonly known as: Zetia Take 1 tablet (10 mg total) by mouth daily.   famotidine 40 MG tablet Commonly known as: PEPCID Take 1 tablet (40 mg total) by mouth at bedtime.   Melatonin 10 MG Tabs Take 10 mg by mouth at bedtime.   montelukast 10 MG tablet Commonly known as: SINGULAIR Take 1 tablet (10 mg total) by mouth at bedtime.   pantoprazole 40 MG tablet Commonly known as:  PROTONIX Take 1 tablet (40 mg total) by mouth daily.   rosuvastatin 40 MG tablet Commonly known as: CRESTOR Take 1 tablet (40 mg total) by mouth daily.   triamcinolone cream 0.1 % Commonly known as: KENALOG Apply 1 Application topically 2 (two) times daily. Prn rash; use up to 2 weeks   valACYclovir 500 MG tablet Commonly known as: VALTREX TAKE 1 TABLET (500 MG TOTAL) BY MOUTH DAILY.        Allergies:  Allergies  Allergen Reactions   Doxycycline Itching and Swelling    Family History: Family History  Problem Relation Age of Onset   Aneurysm Mother        Cerebral   Hypertension Mother    Hyperlipidemia Mother    Stroke Mother    Depression Mother    Coronary artery disease Father  Hypertension Father    Hyperlipidemia Father    Heart attack Father        d. 79   Lung cancer Father        dx early 75s; former smoker   Lung cancer Sister        paternal half-sister; not a smoker; dx <60; d. 44y   Breast cancer Sister 68   Other Sister        hx of hysterectomy for unspecified reason   Eczema Brother    Lung cancer Brother    Lung cancer Brother        d. 57y; smoker   Multiple myeloma Brother        d. 9s   Lung cancer Brother        dx late 3s; smoker   Arthritis Brother    Prostate cancer Brother        dx late 19s   Prostate cancer Brother        dx late 73s   Lung cancer Brother    Diabetes Brother    Leukemia Maternal Aunt        "blood cancer"; d. unspecified age   Lung cancer Maternal Aunt        d. unspecified age   Breast cancer Maternal Aunt        dx unspecified age   Cervical cancer Paternal Aunt        d. late 48s   Anxiety disorder Daughter    Prostate cancer Cousin        maternal 1st cousin; dx older age   Breast cancer Other        niece dx early 43s or before   Leukemia Other        nephew d. 2y; "blood cancer"   Colon cancer Neg Hx     Social History:  reports that she quit smoking about 41 years ago. Her smoking use  included cigarettes. She started smoking about 56 years ago. She has a 7.5 pack-year smoking history. She has never been exposed to tobacco smoke. She has never used smokeless tobacco. She reports that she does not drink alcohol and does not use drugs.  ROS: All other review of systems were reviewed and are negative except what is noted above in HPI  Physical Exam: BP 108/61   Pulse 62   LMP 12/04/2008   Constitutional:  Alert and oriented, No acute distress. HEENT: [place] AT, moist mucus membranes.  Trachea midline, no masses. Cardiovascular: No clubbing, cyanosis, or edema. Respiratory: Normal respiratory effort, no increased work of breathing. GI: Abdomen is soft, nontender, nondistended, no abdominal masses GU: No CVA tenderness.  Lymph: No cervical or inguinal lymphadenopathy. Skin: No rashes, bruises or suspicious lesions. Neurologic: Grossly intact, no focal deficits, moving all 4 extremities. Psychiatric: Normal mood and affect.  Laboratory Data: Lab Results  Component Value Date   WBC 7.8 08/30/2023   HGB 13.7 08/30/2023   HCT 42.2 08/30/2023   MCV 84 08/30/2023   PLT 191 08/30/2023    Lab Results  Component Value Date   CREATININE 0.87 08/30/2023    No results found for: "PSA"  No results found for: "TESTOSTERONE"  Lab Results  Component Value Date   HGBA1C 6.0 (H) 08/30/2023    Urinalysis    Component Value Date/Time   COLORURINE YELLOW 04/15/2016 1515   APPEARANCEUR Clear 09/11/2022 1015   LABSPEC 1.025 04/15/2016 1515   LABSPEC 1.015 09/07/2013 1136   PHURINE 5.5  04/15/2016 1515   GLUCOSEU Negative 09/11/2022 1015   GLUCOSEU Negative 09/07/2013 1136   HGBUR TRACE (A) 04/15/2016 1515   BILIRUBINUR Negative 09/11/2022 1015   BILIRUBINUR Negative 09/07/2013 1136   KETONESUR negative 03/05/2022 0952   KETONESUR 15 (A) 04/15/2016 1515   PROTEINUR Trace 09/11/2022 1015   PROTEINUR NEGATIVE 04/15/2016 1515   UROBILINOGEN 0.2 03/05/2022 0952    UROBILINOGEN 1.0 08/22/2014 1309   UROBILINOGEN 0.2 09/07/2013 1136   NITRITE Negative 09/11/2022 1015   NITRITE NEGATIVE 04/15/2016 1515   LEUKOCYTESUR Trace (A) 09/11/2022 1015   LEUKOCYTESUR Trace 09/07/2013 1136    Lab Results  Component Value Date   LABMICR 6.4 08/30/2023   WBCUA 0-5 09/11/2022   LABEPIT 0-10 09/11/2022   BACTERIA None seen 09/11/2022    Pertinent Imaging:  No results found for this or any previous visit.  No results found for this or any previous visit.  No results found for this or any previous visit.  No results found for this or any previous visit.  Results for orders placed during the hospital encounter of 09/02/23  Ultrasound renal complete  Narrative CLINICAL DATA:  Hematuria  EXAM: RENAL / URINARY TRACT ULTRASOUND COMPLETE  COMPARISON:  CT abdomen pelvis 03/10/2022  FINDINGS: Right Kidney:  Renal measurements: 10.8 x 4.3 x 5.0 cm = volume: 122 mL. Echogenicity within normal limits. No mass or hydronephrosis visualized.  Left Kidney:  Renal measurements: 11.6 x 5.9 x 4.6 cm = volume: 165 mL. Echogenicity within normal limits. No mass or hydronephrosis visualized.  Bladder:  Appears normal for degree of bladder distention.  Other:  None.  IMPRESSION: No hydronephrosis.   Electronically Signed By: Annia Belt M.D. On: 09/13/2023 12:47  No results found for this or any previous visit.  Results for orders placed in visit on 03/05/22  CT HEMATURIA WORKUP  Narrative CLINICAL DATA:  Painless hematuria  EXAM: CT ABDOMEN AND PELVIS WITHOUT AND WITH CONTRAST  TECHNIQUE: Multidetector CT imaging of the abdomen and pelvis was performed following the standard protocol before and following the bolus administration of intravenous contrast.  RADIATION DOSE REDUCTION: This exam was performed according to the departmental dose-optimization program which includes automated exposure control, adjustment of the mA and/or kV  according to patient size and/or use of iterative reconstruction technique.  CONTRAST:  OMNIPAQUE IOHEXOL 300 MG/ML  SOLN  COMPARISON:  Ultrasound 11/07/2021 and CT scan 06/22/2018  FINDINGS: Lower chest: Suspected right coronary artery atherosclerotic calcification.  Hepatobiliary: Unremarkable  Pancreas: Unremarkable  Spleen: Unremarkable  Adrenals/Urinary Tract: Mild scarring in the left kidney upper pole.  No urinary tract calculi, abnormal renal parenchymal enhancement, or abnormal urographic phase filling defect along the urothelium to explain the patient's hematuria.  Stomach/Bowel: Unremarkable  Vascular/Lymphatic: Atherosclerosis is present, including aortoiliac atherosclerotic disease.  Reproductive: Uterus absent. Mild fullness of left ovarian/adnexal tissues, 3.8 by 2.2 cm on image 68 series 7, formerly 4.0 by 2.6 cm on 06/12/2018, probably benign/incidental.  Other: No supplemental non-categorized findings.  Musculoskeletal: Stable chronic sclerosis in the left pedicle and adjacent vertebral body at T12. The lack of change from 06/12/2018 favors benign etiology.  IMPRESSION: 1. No urinary tract calculi, abnormal renal parenchymal enhancement, or abnormal urographic phase filling defect along the urothelium to explain the patient's hematuria. 2. Aortic Atherosclerosis (ICD10-I70.0). Suspected right coronary artery atherosclerosis.   Electronically Signed By: Gaylyn Rong M.D. On: 03/10/2022 09:54  No results found for this or any previous visit.   Assessment & Plan:  1. Acute cystitis with hematuria (Primary) Urine for culture - Urinalysis, Routine w reflex microscopic  2. Microhematuria -followup 1 year with UA   No follow-ups on file.  Wilkie Aye, MD  [facility] [place]

## 2023-09-26 LAB — URINE CULTURE

## 2023-09-29 ENCOUNTER — Telehealth: Payer: Self-pay

## 2023-09-29 NOTE — Telephone Encounter (Signed)
 Pt called to get urine culture results, per verbal from MD McKenzie culture is negative pt notified

## 2023-09-30 ENCOUNTER — Encounter: Payer: Self-pay | Admitting: Urology

## 2023-09-30 NOTE — Patient Instructions (Signed)

## 2023-10-01 ENCOUNTER — Ambulatory Visit (INDEPENDENT_AMBULATORY_CARE_PROVIDER_SITE_OTHER): Payer: Self-pay

## 2023-10-01 DIAGNOSIS — J309 Allergic rhinitis, unspecified: Secondary | ICD-10-CM

## 2023-10-04 ENCOUNTER — Ambulatory Visit: Payer: HMO | Admitting: Internal Medicine

## 2023-10-04 ENCOUNTER — Other Ambulatory Visit: Payer: Self-pay

## 2023-10-04 VITALS — BP 118/84 | HR 59 | Temp 98.7°F | Resp 16

## 2023-10-04 DIAGNOSIS — L501 Idiopathic urticaria: Secondary | ICD-10-CM | POA: Diagnosis not present

## 2023-10-04 DIAGNOSIS — J302 Other seasonal allergic rhinitis: Secondary | ICD-10-CM

## 2023-10-04 DIAGNOSIS — J3089 Other allergic rhinitis: Secondary | ICD-10-CM

## 2023-10-04 MED ORDER — AZELASTINE HCL 0.1 % NA SOLN: 2.0000 | Freq: Two times a day (BID) | NASAL | 5 refills | Status: DC | PRN

## 2023-10-04 MED ORDER — FAMOTIDINE 40 MG PO TABS: 40.0000 mg | ORAL_TABLET | Freq: Every day | ORAL | 1 refills | Status: DC

## 2023-10-04 MED ORDER — CETIRIZINE HCL 10 MG PO TABS: 10.0000 mg | ORAL_TABLET | Freq: Two times a day (BID) | ORAL | 1 refills | Status: DC

## 2023-10-04 MED ORDER — MONTELUKAST SODIUM 10 MG PO TABS: 10.0000 mg | ORAL_TABLET | Freq: Every day | ORAL | 1 refills | Status: DC

## 2023-10-04 NOTE — Patient Instructions (Addendum)
 Allergic Rhinitis - SPT 04/2022: positive to tree pollen, dust mite, and cockroach  - Consider saline nasal rinses as needed for nasal symptoms. Use this before any medicated nasal sprays for best result. - Use Azelastine 2 sprays twice daily as needed for congestion/drainage/sneezing/runny nose.  Aim upward and outward.  - Use Zyrtec 10mg  twice daily.   - Use Singulair 10mg  daily.  - Continue allergy shots on schedule.  Must bring Epipen for each shot visit. Re-Initiated 08/2023.   Chronic Idiopathic Urticaria/Angioedema (Hives/Swelling): - At this time etiology of hives and swelling is unknown. Hives can be caused by a variety of different triggers including illness/infection, exercise, pressure, vibrations, extremes of temperature to name a few however majority of the time there is no identifiable trigger.    -Use Zyrtec (Cetirizine) 10mg  twice daily and Pepcid (Famotidine) 40mg  daily.  -Continue Singulair 10mg  daily.

## 2023-10-04 NOTE — Progress Notes (Signed)
 FOLLOW UP Date of Service/Encounter:  10/04/23   Subjective:  [name] [name] (DOB: 1960-09-29) is a 63 y.o. female who [facility] for follow up for allergic rhinitis and urticaria.   History obtained from: chart review and patient. Last visit was with me on 07/05/2023 and at the time, reported frequent episodes of hives/swelling since stopping allergy shots and wished to restart.  We discussed that allergy shots are indicated for her allergies but may or may not help for hives.  Regardless she wanted to restart the shots and received first shot on 2/17.    Since starting allergy shots, reports some decrease in hives but still having outbreaks.  She calls them knots but has pictures that clearly show hives.  No scarring/pain.  Does also have episodes of itching on arms and palms.  Not sure exactly what meds she takes but thinks Zyrtec 10mg  BID, Pepcid 40mg  daily and Singulair daily.    Also notes lots of congestion, drainage, sniffling with Spring. Using Azelastine here and there.   Also on Zyrtec and Singulair. No reactions with allergy shots.  Has an Epipen.   Past Medical History: Past Medical History:  Diagnosis Date   Allergic rhinitis    Allergy    Anemia, unspecified 05/30/2013   Anxiety    Breast cancer (HCC) 03/31/2013   left   Cancer (HCC)    Chest pain 2009   Consultation-Rothbart, negative chest CT; nl echo in 2005; h/o palpitations   Chiari malformation type I (HCC)    s/p suboccipital craniectomy, C1 laminectomy, superior C2 laminectomy, duraplasty 05/10/14   Colitis 2010   not IBD   Degenerative joint disease    + degenerative joint disease of the lumbosacral spine   Depression    Diabetes (HCC)    Diabetes (HCC)    Dysrhythmia    palpations   Genetic testing 07/01/2016   GERD (gastroesophageal reflux disease)    "a little"   Headache    Heart murmur    "small"   Herpes simplex type II infection     Hypercholesteremia    "slightly high"   Hypertension    does not have high blood pressure   Meningitis 08/22/2014   Palpitations    Personal history of chemotherapy    Personal history of radiation therapy    Urticaria    Wears glasses     Objective:  BP 118/84   Pulse (!) 59   Temp 98.7 F (37.1 C)   Resp 16   LMP 12/04/2008   SpO2 97%  There is no height or weight on file to calculate BMI. Physical Exam: GEN: alert, well developed HEENT: clear conjunctiva, nose with mild inferior turbinate hypertrophy, pink nasal mucosa, + clear rhinorrhea, no cobblestoning HEART: regular rate and rhythm, no murmur LUNGS: clear to auscultation bilaterally, no coughing, unlabored respiration SKIN: no rashes or lesions   Assessment:   1. Chronic idiopathic urticaria   2. Seasonal and perennial allergic rhinitis     Plan/Recommendations:  Allergic Rhinitis - Uncontrolled, continue building on AIT. - SPT 04/2022: positive to tree pollen, dust mite, and cockroach  - Consider saline nasal rinses as needed for nasal symptoms. Use this before any medicated nasal sprays for best result. - Use Azelastine 2 sprays twice daily as needed for congestion/drainage/sneezing/runny nose.  Aim upward and outward.  - Use Zyrtec 10mg  twice daily.   - Use Singulair 10mg  daily.   - Continue allergy  shots on schedule.  Must bring Epipen for each shot visit. Re-Initiated 08/2023.   Chronic Idiopathic Urticaria/Angioedema (Hives/Swelling): - Uncontrolled.  Discussed increased anti histamine dose.  In the past, we discussed allergy shots may or may not help with hives/swelling and to consider Xolair.  - At this time etiology of hives and swelling is unknown. Hives can be caused by a variety of different triggers including illness/infection, exercise, pressure, vibrations, extremes of temperature to name a few however majority of the time there is no identifiable trigger.    -Use Zyrtec (Cetirizine) 10mg  twice  daily and Pepcid (Famotidine) 40mg  daily.  -Continue Singulair 10mg  daily.      Return in about 4 months (around 02/03/2024).  Alesia Morin, MD [facility]

## 2023-10-06 ENCOUNTER — Other Ambulatory Visit: Payer: Self-pay | Admitting: Family Medicine

## 2023-10-06 DIAGNOSIS — F32A Depression, unspecified: Secondary | ICD-10-CM

## 2023-10-09 ENCOUNTER — Other Ambulatory Visit: Payer: Self-pay | Admitting: Family Medicine

## 2023-10-09 DIAGNOSIS — R002 Palpitations: Secondary | ICD-10-CM

## 2023-10-09 DIAGNOSIS — I10 Essential (primary) hypertension: Secondary | ICD-10-CM

## 2023-10-13 ENCOUNTER — Ambulatory Visit (INDEPENDENT_AMBULATORY_CARE_PROVIDER_SITE_OTHER): Payer: Self-pay

## 2023-10-13 DIAGNOSIS — J309 Allergic rhinitis, unspecified: Secondary | ICD-10-CM | POA: Diagnosis not present

## 2023-10-27 ENCOUNTER — Ambulatory Visit (INDEPENDENT_AMBULATORY_CARE_PROVIDER_SITE_OTHER): Payer: Self-pay

## 2023-10-27 DIAGNOSIS — J309 Allergic rhinitis, unspecified: Secondary | ICD-10-CM

## 2023-10-28 ENCOUNTER — Ambulatory Visit: Admitting: Nurse Practitioner

## 2023-10-28 VITALS — BP 129/73 | HR 58 | Temp 98.6°F | Ht 61.0 in | Wt 145.4 lb

## 2023-10-28 DIAGNOSIS — F32A Depression, unspecified: Secondary | ICD-10-CM

## 2023-10-28 DIAGNOSIS — I10 Essential (primary) hypertension: Secondary | ICD-10-CM | POA: Diagnosis not present

## 2023-10-28 DIAGNOSIS — F419 Anxiety disorder, unspecified: Secondary | ICD-10-CM

## 2023-10-29 ENCOUNTER — Encounter: Payer: Self-pay | Admitting: Nurse Practitioner

## 2023-10-29 NOTE — Progress Notes (Signed)
 Subjective:    Patient ID: [name] [name], female    DOB: 22-Apr-1961, 63 y.o.   MRN: [id number]  HPI Presents for complaints of low energy for the past couple of months.  Concerned about possible issues with her sugar.  Postprandial blood sugars running 140-150.  Her fasting this morning was 99.  Fatigue occurs off and on worse on certain days.  Still performing her normal activities.  Has a very brief episode of rapid heart rate about once a month only last a very short period of time.  No syncopal episodes.  Has noticed some fatigue after this.  Continues to have significant stress due to her family issues and providing care for foster children.  Continues to take melatonin and Tylenol  PM for sleep which is working well.  Gets regular preventive health physicals with GYN.   Review of Systems  Constitutional:  Positive for fatigue.  HENT:  Negative for sore throat and trouble swallowing.   Respiratory:  Negative for cough, chest tightness and shortness of breath.   Cardiovascular:  Positive for palpitations. Negative for chest pain and leg swelling.       10/28/2023    3:38 PM  Depression screen PHQ 2/9  Decreased Interest 0  Down, Depressed, Hopeless 1  PHQ - 2 Score 1  Altered sleeping 2  Tired, decreased energy 1  Change in appetite 0  Feeling bad or failure about yourself  1  Trouble concentrating 1  Moving slowly or fidgety/restless 0  Suicidal thoughts 0  PHQ-9 Score 6      10/28/2023    3:38 PM 08/30/2023    9:56 AM 02/26/2023   11:00 AM 01/11/2023    1:55 PM  GAD 7 : Generalized Anxiety Score  Nervous, Anxious, on Edge 2 1 0 0  Control/stop worrying 2 1 0 0  Worry too much - different things 1 1 0 1  Trouble relaxing 2 1 0 1  Restless 2 0 0 0  Easily annoyed or irritable 1 1 0 1  Afraid - awful might happen 0 0 0 0  Total GAD 7 Score 10 5 0 3  Anxiety Difficulty  Somewhat difficult Not difficult at all Not difficult at all   Social History   Tobacco Use    Smoking status: Former    Current packs/day: 0.00    Average packs/day: 0.5 packs/day for 15.0 years (7.5 ttl pk-yrs)    Types: Cigarettes    Start date: 07/07/1967    Quit date: 07/06/1982    Years since quitting: 41.3    Passive exposure: Never   Smokeless tobacco: Never  Vaping Use   Vaping status: Never Used  Substance Use Topics   Alcohol use: No   Drug use: No         Objective:   Physical Exam NAD.  Alert, oriented.  Calm affect.  Making good eye contact.  Speech clear.  Dressed appropriately for the weather.  Thoughts logical coherent and relevant.  Normal behavior and judgment.  Thyroid  nontender to palpation, no mass or goiter noted.  Lungs clear.  Heart regular rate rhythm. Today's Vitals   10/28/23 1531  BP: 129/73  Pulse: (!) 58  Temp: 98.6 F (37 C)  SpO2: 100%  Weight: 145 lb 6.4 oz (66 kg)  Height: 5\' 1"  (1.549 m)   Body mass index is 27.47 kg/m.  Recent Results (from the past 2160 hours)  CBC     Status: None   Collection  Time: 08/30/23 10:41 AM  Result Value Ref Range   WBC 7.8 3.4 - 10.8 x10E3/uL   RBC 5.05 3.77 - 5.28 x10E6/uL   Hemoglobin 13.7 11.1 - 15.9 g/dL   Hematocrit 16.1 09.6 - 46.6 %   MCV 84 79 - 97 fL   MCH 27.1 26.6 - 33.0 pg   MCHC 32.5 31.5 - 35.7 g/dL   RDW 04.5 40.9 - 81.1 %   Platelets 191 150 - 450 x10E3/uL  CMP14+EGFR     Status: Abnormal   Collection Time: 08/30/23 10:41 AM  Result Value Ref Range   Glucose 84 70 - 99 mg/dL   BUN 12 8 - 27 mg/dL   Creatinine, Ser 9.14 0.57 - 1.00 mg/dL   eGFR 75 >78 GN/FAO/1.30   BUN/Creatinine Ratio 14 12 - 28   Sodium 144 134 - 144 mmol/L   Potassium 4.6 3.5 - 5.2 mmol/L   Chloride 106 96 - 106 mmol/L   CO2 20 20 - 29 mmol/L   Calcium  9.2 8.7 - 10.3 mg/dL   Total Protein 7.2 6.0 - 8.5 g/dL   Albumin 4.4 3.9 - 4.9 g/dL   Globulin, Total 2.8 1.5 - 4.5 g/dL   Bilirubin Total 0.3 0.0 - 1.2 mg/dL   Alkaline Phosphatase 81 44 - 121 IU/L   AST 31 0 - 40 IU/L   ALT 39 (H) 0 - 32 IU/L   Hemoglobin A1c     Status: Abnormal   Collection Time: 08/30/23 10:41 AM  Result Value Ref Range   Hgb A1c MFr Bld 6.0 (H) 4.8 - 5.6 %    Comment:          Prediabetes: 5.7 - 6.4          Diabetes: >6.4          Glycemic control for adults with diabetes: <7.0    Est. average glucose Bld gHb Est-mCnc 126 mg/dL  Lipid panel     Status: None   Collection Time: 08/30/23 10:41 AM  Result Value Ref Range   Cholesterol, Total 180 100 - 199 mg/dL   Triglycerides 83 0 - 149 mg/dL   HDL 79 >86 mg/dL   VLDL Cholesterol Cal 15 5 - 40 mg/dL   LDL Chol Calc (NIH) 86 0 - 99 mg/dL   Chol/HDL Ratio 2.3 0.0 - 4.4 ratio    Comment:                                   T. Chol/HDL Ratio                                             Men  Women                               1/2 Avg.Risk  3.4    3.3                                   Avg.Risk  5.0    4.4  2X Avg.Risk  9.6    7.1                                3X Avg.Risk 23.4   11.0   Microalbumin / creatinine urine ratio     Status: None   Collection Time: 08/30/23 10:41 AM  Result Value Ref Range   Creatinine, Urine 46.4 Not Estab. mg/dL   Microalbumin, Urine 6.4 Not Estab. ug/mL   Microalb/Creat Ratio 14 0 - 29 mg/g creat    Comment:                        Normal:                0 -  29                        Moderately increased: 30 - 300                        Severely increased:       >300   Reviewed most recent labs with patient during visit.       Assessment & Plan:   Problem List Items Addressed This Visit       Cardiovascular and Mediastinum   Essential hypertension - Primary     Other   Anxiety and depression   With her labs being normal and no significant changes in her blood sugar, based on her extreme stress at the moment feel this is most likely coming from anxiety and depression.  Discussed options.  Increase bupropion  XL to 300 mg daily, take 2 of her current tablets.  Call back if any side  effects or if no improvement over the next few weeks.  Discussed importance of self-care and stress reduction.  Note that patient's pulse at baseline runs 50s to 60s which is normal for her.  She has stopped her atenolol . Recheck if symptoms worsen or persist.  Otherwise routine follow-up.

## 2023-11-03 ENCOUNTER — Ambulatory Visit (INDEPENDENT_AMBULATORY_CARE_PROVIDER_SITE_OTHER)

## 2023-11-03 DIAGNOSIS — J309 Allergic rhinitis, unspecified: Secondary | ICD-10-CM | POA: Diagnosis not present

## 2023-11-10 ENCOUNTER — Ambulatory Visit (INDEPENDENT_AMBULATORY_CARE_PROVIDER_SITE_OTHER): Payer: Self-pay

## 2023-11-10 DIAGNOSIS — J309 Allergic rhinitis, unspecified: Secondary | ICD-10-CM | POA: Diagnosis not present

## 2023-11-17 ENCOUNTER — Ambulatory Visit (INDEPENDENT_AMBULATORY_CARE_PROVIDER_SITE_OTHER)

## 2023-11-17 DIAGNOSIS — J309 Allergic rhinitis, unspecified: Secondary | ICD-10-CM | POA: Diagnosis not present

## 2023-11-24 ENCOUNTER — Ambulatory Visit (INDEPENDENT_AMBULATORY_CARE_PROVIDER_SITE_OTHER)

## 2023-11-24 DIAGNOSIS — J309 Allergic rhinitis, unspecified: Secondary | ICD-10-CM | POA: Diagnosis not present

## 2023-11-27 ENCOUNTER — Other Ambulatory Visit: Payer: Self-pay | Admitting: Family Medicine

## 2023-12-01 ENCOUNTER — Ambulatory Visit (INDEPENDENT_AMBULATORY_CARE_PROVIDER_SITE_OTHER): Payer: Self-pay

## 2023-12-01 DIAGNOSIS — J309 Allergic rhinitis, unspecified: Secondary | ICD-10-CM | POA: Diagnosis not present

## 2023-12-02 ENCOUNTER — Encounter: Payer: Self-pay | Admitting: Internal Medicine

## 2023-12-10 ENCOUNTER — Ambulatory Visit (INDEPENDENT_AMBULATORY_CARE_PROVIDER_SITE_OTHER): Payer: Self-pay

## 2023-12-10 DIAGNOSIS — J309 Allergic rhinitis, unspecified: Secondary | ICD-10-CM | POA: Diagnosis not present

## 2023-12-15 ENCOUNTER — Ambulatory Visit (INDEPENDENT_AMBULATORY_CARE_PROVIDER_SITE_OTHER): Payer: Self-pay

## 2023-12-15 DIAGNOSIS — J309 Allergic rhinitis, unspecified: Secondary | ICD-10-CM

## 2023-12-22 ENCOUNTER — Ambulatory Visit (INDEPENDENT_AMBULATORY_CARE_PROVIDER_SITE_OTHER): Payer: Self-pay

## 2023-12-22 DIAGNOSIS — J309 Allergic rhinitis, unspecified: Secondary | ICD-10-CM | POA: Diagnosis not present

## 2023-12-27 ENCOUNTER — Ambulatory Visit: Admitting: Nurse Practitioner

## 2023-12-27 VITALS — BP 110/70 | HR 60 | Temp 97.9°F | Ht 61.0 in | Wt 135.0 lb

## 2023-12-27 DIAGNOSIS — Z0001 Encounter for general adult medical examination with abnormal findings: Secondary | ICD-10-CM

## 2023-12-27 DIAGNOSIS — Z01419 Encounter for gynecological examination (general) (routine) without abnormal findings: Secondary | ICD-10-CM

## 2023-12-27 NOTE — Progress Notes (Unsigned)
 Subjective:    Patient ID: [name] [name], female    DOB: [birth year], 63 y.o.   MRN: [id number]  HPI The patient comes in today for a wellness visit.    A review of their health history was completed.  A review of medications was also completed.  Any needed refills; no  Eating habits: fair; decreased appetite; eats once a day with one snack; states GERD is under good control with use of Pantoprazole ; See GI specialist   Falls/  MVA accidents in past few months: no   Regular exercise: stays active with kids   Specialist pt sees on regular basis: ortho and gastro yearly  Preventative health issues were discussed.   Additional concerns: renewal fo foster care license  Married, no new sexual partners. Decreased sex drive.  Colonoscopy and mammogram up to date.  Has not had a bone density recently. Has been on treatment for breast cancer in the past.   Review of Systems  Constitutional:  Negative for activity change, appetite change and fatigue.  HENT:  Negative for sore throat and trouble swallowing.   Respiratory:  Negative for cough, chest tightness, shortness of breath and wheezing.   Cardiovascular:  Negative for chest pain.  Gastrointestinal:  Positive for abdominal pain and nausea. Negative for abdominal distention, blood in stool, constipation, diarrhea and vomiting.       Occasional abdominal pain and bloating.   Genitourinary:  Negative for difficulty urinating, dysuria, enuresis, frequency, genital sores, pelvic pain, urgency and vaginal discharge.       Abdominal hysterectomy. Thinks she had BSO. Denies vulvar rash, irritation, itching or burning.       12/27/2023   10:02 AM  Depression screen PHQ 2/9  Decreased Interest 0  Down, Depressed, Hopeless 0  PHQ - 2 Score 0  Altered sleeping 0  Tired, decreased energy 1  Change in appetite 1  Feeling bad or failure about yourself  0  Trouble concentrating 0  Moving slowly or fidgety/restless 0  Suicidal  thoughts 0  PHQ-9 Score 2  Difficult doing work/chores Not difficult at all      12/27/2023   10:02 AM 10/28/2023    3:38 PM 08/30/2023    9:56 AM 02/26/2023   11:00 AM  GAD 7 : Generalized Anxiety Score  Nervous, Anxious, on Edge 0 2 1 0  Control/stop worrying 1 2 1  0  Worry too much - different things 1 1 1  0  Trouble relaxing 1 2 1  0  Restless 0 2 0 0  Easily annoyed or irritable 0 1 1 0  Afraid - awful might happen 0 0 0 0  Total GAD 7 Score 3 10 5  0  Anxiety Difficulty Not difficult at all  Somewhat difficult Not difficult at all    Social History   Tobacco Use   Smoking status: Former    Current packs/day: 0.00    Average packs/day: 0.5 packs/day for 15.0 years (7.5 ttl pk-yrs)    Types: Cigarettes    Start date: 07/07/1967    Quit date: 07/06/1982    Years since quitting: 41.5    Passive exposure: Never   Smokeless tobacco: Never  Vaping Use   Vaping status: Never Used  Substance Use Topics   Alcohol use: No   Drug use: No        Objective:   Physical Exam Vitals and nursing note reviewed.  Constitutional:      General: She is not in acute distress.  Appearance: She is well-developed.  Neck:     Thyroid : No thyromegaly.     Trachea: No tracheal deviation.     Comments: Thyroid  non tender to palpation. No mass or goiter noted.  Cardiovascular:     Rate and Rhythm: Normal rate and regular rhythm.     Heart sounds: Normal heart sounds. No murmur heard. Pulmonary:     Effort: Pulmonary effort is normal.     Breath sounds: Normal breath sounds.  Chest:  Breasts:    Right: No swelling, inverted nipple, mass, skin change or tenderness.     Left: No swelling, inverted nipple, mass, skin change or tenderness.  Abdominal:     General: There is no distension.     Palpations: Abdomen is soft.     Tenderness: There is no abdominal tenderness.  Genitourinary:    Comments: Defers GU/pelvic exam. Denies any problems.   Musculoskeletal:     Cervical back: Normal  range of motion and neck supple.  Lymphadenopathy:     Cervical: No cervical adenopathy.     Upper Body:     Right upper body: No supraclavicular, axillary or pectoral adenopathy.     Left upper body: No supraclavicular, axillary or pectoral adenopathy.   Skin:    General: Skin is warm and dry.   Neurological:     Mental Status: She is alert and oriented to person, place, and time.   Psychiatric:        Mood and Affect: Mood normal.        Behavior: Behavior normal.        Thought Content: Thought content normal.        Judgment: Judgment normal.    Today's Vitals   12/27/23 0938  BP: 110/70  Pulse: 60  Temp: 97.9 F (36.6 C)  SpO2: 100%  Weight: 135 lb (61.2 kg)  Height: 5' 1 (1.549 m)   Body mass index is 25.51 kg/m.         Assessment & Plan:  Well woman exam Adult wellness-complete.wellness physical was conducted today. Importance of diet and exercise were discussed in detail.  Importance of stress reduction and healthy living were discussed.  In addition to this a discussion regarding safety was also covered.  We also reviewed over immunizations and gave recommendations regarding current immunization needed for age.   Recommend Tdap and shingles vaccine at local pharmacy. Forms completed for her foster care physical.  Return in about 1 year (around 12/26/2024) for physical. Follow up with Dr. Bluford on 8/25 as planned.

## 2023-12-27 NOTE — Patient Instructions (Signed)
 Tdap  Shingles vaccine

## 2023-12-28 ENCOUNTER — Encounter: Payer: Self-pay | Admitting: Nurse Practitioner

## 2023-12-29 ENCOUNTER — Ambulatory Visit (INDEPENDENT_AMBULATORY_CARE_PROVIDER_SITE_OTHER): Payer: Self-pay

## 2023-12-29 DIAGNOSIS — J309 Allergic rhinitis, unspecified: Secondary | ICD-10-CM | POA: Diagnosis not present

## 2024-01-02 ENCOUNTER — Other Ambulatory Visit: Payer: Self-pay | Admitting: Family Medicine

## 2024-01-02 DIAGNOSIS — F32A Depression, unspecified: Secondary | ICD-10-CM

## 2024-01-05 ENCOUNTER — Ambulatory Visit (INDEPENDENT_AMBULATORY_CARE_PROVIDER_SITE_OTHER): Payer: Self-pay

## 2024-01-05 DIAGNOSIS — J309 Allergic rhinitis, unspecified: Secondary | ICD-10-CM

## 2024-01-12 ENCOUNTER — Ambulatory Visit (INDEPENDENT_AMBULATORY_CARE_PROVIDER_SITE_OTHER): Payer: Self-pay

## 2024-01-12 DIAGNOSIS — J309 Allergic rhinitis, unspecified: Secondary | ICD-10-CM

## 2024-01-19 ENCOUNTER — Ambulatory Visit (INDEPENDENT_AMBULATORY_CARE_PROVIDER_SITE_OTHER): Payer: Self-pay

## 2024-01-19 DIAGNOSIS — J309 Allergic rhinitis, unspecified: Secondary | ICD-10-CM | POA: Diagnosis not present

## 2024-01-20 DIAGNOSIS — J3089 Other allergic rhinitis: Secondary | ICD-10-CM | POA: Diagnosis not present

## 2024-01-20 NOTE — Progress Notes (Signed)
 VIAL MADE 01-20-24

## 2024-01-26 ENCOUNTER — Ambulatory Visit (INDEPENDENT_AMBULATORY_CARE_PROVIDER_SITE_OTHER): Payer: Self-pay

## 2024-01-26 DIAGNOSIS — J309 Allergic rhinitis, unspecified: Secondary | ICD-10-CM

## 2024-01-31 ENCOUNTER — Ambulatory Visit: Admitting: Allergy

## 2024-02-09 ENCOUNTER — Ambulatory Visit (INDEPENDENT_AMBULATORY_CARE_PROVIDER_SITE_OTHER): Payer: Self-pay

## 2024-02-09 DIAGNOSIS — J309 Allergic rhinitis, unspecified: Secondary | ICD-10-CM | POA: Diagnosis not present

## 2024-02-14 ENCOUNTER — Other Ambulatory Visit: Payer: Self-pay

## 2024-02-14 ENCOUNTER — Encounter: Payer: Self-pay | Admitting: Internal Medicine

## 2024-02-14 ENCOUNTER — Ambulatory Visit: Admitting: Internal Medicine

## 2024-02-14 VITALS — BP 122/80 | HR 68 | Temp 97.6°F | Resp 16 | Ht 60.0 in | Wt 139.2 lb

## 2024-02-14 DIAGNOSIS — J302 Other seasonal allergic rhinitis: Secondary | ICD-10-CM | POA: Diagnosis not present

## 2024-02-14 DIAGNOSIS — L501 Idiopathic urticaria: Secondary | ICD-10-CM | POA: Diagnosis not present

## 2024-02-14 DIAGNOSIS — J3089 Other allergic rhinitis: Secondary | ICD-10-CM

## 2024-02-14 MED ORDER — FAMOTIDINE 40 MG PO TABS: 40.0000 mg | ORAL_TABLET | Freq: Every day | ORAL | 1 refills | Status: AC

## 2024-02-14 MED ORDER — AZELASTINE HCL 0.1 % NA SOLN: 2.0000 | Freq: Two times a day (BID) | NASAL | 5 refills | Status: AC | PRN

## 2024-02-14 MED ORDER — EPINEPHRINE 0.3 MG/0.3ML IJ SOAJ: 0.3000 mg | INTRAMUSCULAR | 1 refills | Status: AC | PRN

## 2024-02-14 MED ORDER — MONTELUKAST SODIUM 10 MG PO TABS: 10.0000 mg | ORAL_TABLET | Freq: Every day | ORAL | 1 refills | Status: AC

## 2024-02-14 MED ORDER — CETIRIZINE HCL 10 MG PO TABS: 10.0000 mg | ORAL_TABLET | Freq: Two times a day (BID) | ORAL | 1 refills | Status: AC | PRN

## 2024-02-14 NOTE — Progress Notes (Addendum)
 FOLLOW UP Date of Service/Encounter:  02/14/24   Subjective:  [name] [name] (DOB: [birth year]) is a 63 y.o. female who [facility] for follow up for CIU and allergic rhinitis.   History obtained from: chart review and patient. Last visit was with me on 10/04/2023: AR on Azelastine  PRN, Zyrtec  BID, Singulair  daily, reinitiated AIT due to uncontrolled AR and hives.  Also discussed AIT may not help with hives, consider Xolair.   Reports doing a lot better.  Not as much congestion/runny nose with allergy  shots.  Taking Zyrtec  daily and Singulair  daily. Has an Epipen  with AIT. No reactions.  Has not noted as many hives but still gets itchy, prickly skin especially on arms when outdoors.  Not doing Pepcid /double dose Zyrtec .   Past Medical History: Past Medical History:  Diagnosis Date   Allergic rhinitis    Allergy     Anemia, unspecified 05/30/2013   Anxiety    Breast cancer (HCC) 03/31/2013   left   Cancer (HCC)    Chest pain 2009   Consultation-Rothbart, negative chest CT; nl echo in 2005; h/o palpitations   Chiari malformation type I (HCC)    s/p suboccipital craniectomy, C1 laminectomy, superior C2 laminectomy, duraplasty 05/10/14   Colitis 2010   not IBD   Degenerative joint disease    + degenerative joint disease of the lumbosacral spine   Depression    Diabetes (HCC)    Diabetes (HCC)    Dysrhythmia    palpations   Genetic testing 07/01/2016   GERD (gastroesophageal reflux disease)    a little   Headache    Heart murmur    small   Herpes simplex type II infection    Hypercholesteremia    slightly high   Hypertension    does not have high blood pressure   Meningitis 08/22/2014   Palpitations    Personal history of chemotherapy    Personal history of radiation therapy    Urticaria    Wears glasses     Objective:  BP 122/80   Pulse 68   Temp 97.6 F (36.4 C)   Resp 16   Ht 5' (1.524 m)   Wt 139 lb 4 oz  (63.2 kg)   LMP 12/04/2008   SpO2 96%   BMI 27.20 kg/m  Body mass index is 27.2 kg/m. Physical Exam: GEN: alert, well developed HEENT: clear conjunctiva, nose with mild inferior turbinate hypertrophy, pink nasal mucosa, no rhinorrhea, no cobblestoning HEART: regular rate and rhythm, no murmur LUNGS: clear to auscultation bilaterally, no coughing, unlabored respiration SKIN: no rashes or lesions  Assessment:   1. Chronic idiopathic urticaria   2. Seasonal and perennial allergic rhinitis     Plan/Recommendations:   Allergic Rhinitis - Improved with AIT, plan to continue for 5 years.  - SPT 04/2022: positive to tree pollen, dust mite, and cockroach  - Consider saline nasal rinses as needed for nasal symptoms. Use this before any medicated nasal sprays for best result. - Use Azelastine  2 sprays twice daily as needed for congestion/drainage/sneezing/runny nose.  Aim upward and outward.  - Use Zyrtec  10mg  daily.  Can take an extra dose if needed.  - Use Singulair  10mg  daily.  - Continue allergy  shots on schedule.  Must bring Epipen  for each shot visit. Re-Initiated 08/2023.   Chronic Idiopathic Urticaria/Angioedema (Hives/Swelling): - Some improvement noted with AIT, discussed AIT may not help control this and if worsening, consider Xolair.  - At  this time etiology of hives and swelling is unknown. Hives can be caused by a variety of different triggers including illness/infection, exercise, pressure, vibrations, extremes of temperature to name a few however majority of the time there is no identifiable trigger.    - Continue Zyrtec  10mg  daily. - If symptoms worsen, increase to Zyrtec  10mg  twice daily and add Pepcid  40mg  daily.  -Continue Singulair  10mg  daily.        Return in about 6 months (around 08/16/2024).  Arleta Blanch, MD [facility] 

## 2024-02-14 NOTE — Patient Instructions (Addendum)
 Allergic Rhinitis - SPT 04/2022: positive to tree pollen, dust mite, and cockroach  - Consider saline nasal rinses as needed for nasal symptoms. Use this before any medicated nasal sprays for best result. - Use Azelastine  2 sprays twice daily as needed for congestion/drainage/sneezing/runny nose.  Aim upward and outward.  - Use Zyrtec  10mg  daily.  Can take an extra dose if needed.  - Use Singulair  10mg  daily.  - Continue allergy  shots on schedule.  Must bring Epipen  for each shot visit. Re-Initiated 08/2023.   Chronic Idiopathic Urticaria/Angioedema (Hives/Swelling): - At this time etiology of hives and swelling is unknown. Hives can be caused by a variety of different triggers including illness/infection, exercise, pressure, vibrations, extremes of temperature to name a few however majority of the time there is no identifiable trigger.    - Continue Zyrtec  10mg  daily. - If symptoms worsen, increase to Zyrtec  10mg  twice daily and add Pepcid  40mg  daily.  -Continue Singulair  10mg  daily.

## 2024-02-23 ENCOUNTER — Ambulatory Visit (INDEPENDENT_AMBULATORY_CARE_PROVIDER_SITE_OTHER): Payer: Self-pay

## 2024-02-23 DIAGNOSIS — J309 Allergic rhinitis, unspecified: Secondary | ICD-10-CM

## 2024-02-28 ENCOUNTER — Encounter: Payer: Self-pay | Admitting: Family Medicine

## 2024-02-28 ENCOUNTER — Ambulatory Visit (INDEPENDENT_AMBULATORY_CARE_PROVIDER_SITE_OTHER): Payer: HMO | Admitting: Family Medicine

## 2024-02-28 VITALS — BP 102/50 | HR 57 | Temp 97.2°F | Ht 60.0 in | Wt 142.0 lb

## 2024-02-28 DIAGNOSIS — I10 Essential (primary) hypertension: Secondary | ICD-10-CM

## 2024-02-28 DIAGNOSIS — E119 Type 2 diabetes mellitus without complications: Secondary | ICD-10-CM | POA: Diagnosis not present

## 2024-02-28 DIAGNOSIS — E785 Hyperlipidemia, unspecified: Secondary | ICD-10-CM

## 2024-02-28 DIAGNOSIS — E1169 Type 2 diabetes mellitus with other specified complication: Secondary | ICD-10-CM | POA: Diagnosis not present

## 2024-02-28 NOTE — Assessment & Plan Note (Signed)
 Experiencing dizziness.  BP 102/58 today.  Stopping atenolol .

## 2024-02-28 NOTE — Assessment & Plan Note (Signed)
 Has been stable.  A1c today to assess.

## 2024-02-28 NOTE — Progress Notes (Signed)
 Subjective:  Patient ID: [name] [name], female    DOB: 11/29/1960  Age: 63 y.o. MRN: [id number]  CC:   Chief Complaint  Patient presents with   Diabetes   Hypertension   Dizziness    Comes and goes for over a couple of months    HPI:  63 year old female with the below mentioned medical problems presents for follow-up.  BP 102/50 today.  Patient endorses compliance with atenolol .  She states that she is having some intermittent dizziness.  Will discuss discontinuation as her blood pressure is exceptionally well-controlled.  Patient denies chest pain or shortness of breath.  Patient's last A1c was 6.0.  She is not currently on any pharmacotherapy.  Needs repeat A1c today.  Lipids have been fairly well-controlled on rosuvastatin  and Zetia .  She is tolerating.   Patient Active Problem List   Diagnosis Date Noted   Anxiety and depression 07/27/2022   Allergic rhinitis 07/15/2021   NAFLD (nonalcoholic fatty liver disease) 88/90/7977   Psychophysiological insomnia 08/23/2020   Hyperlipidemia associated with type 2 diabetes mellitus (HCC) 03/22/2017   Diabetes (HCC)    IBS (irritable bowel syndrome) 12/10/2015   History of Chiari malformation 08/23/2014   Malignant neoplasm of upper-outer quadrant of left breast in female, estrogen receptor positive (HCC) 04/06/2013   Obesity (BMI 30-39.9) 12/14/2012   GERD (gastroesophageal reflux disease) 12/14/2012   Essential hypertension 09/02/2012    Social Hx   Social History   Socioeconomic History   Marital status: Married    Spouse name: Ubaldo   Number of children: 4   Years of education: Not on file   Highest education level: 12th grade  Occupational History   Occupation: Marine scientist work    Associate Professor: Nurse, adult [employer]  Tobacco Use   Smoking status: Former    Current packs/day: 0.00    Average packs/day: 0.5 packs/day for 15.0 years (7.5 ttl pk-yrs)    Types: Cigarettes    Start date: 07/07/1967    Quit date: 07/06/1982     Years since quitting: 41.6    Passive exposure: Never   Smokeless tobacco: Never  Vaping Use   Vaping status: Never Used  Substance and Sexual Activity   Alcohol use: No   Drug use: No   Sexual activity: Yes    Partners: Male    Birth control/protection: Surgical    Comment: TAH  Other Topics Concern   Not on file  Social History Narrative   4 biological children   4 step children   13 grandchildren    1 great grandchildren   2 foster children   Social Drivers of Corporate investment banker Strain: Low Risk  (01/11/2023)   Overall Financial Resource Strain (CARDIA)    Difficulty of Paying Living Expenses: Not very hard  Food Insecurity: No Food Insecurity (08/29/2023)   Hunger Vital Sign    Worried About Running Out of Food in the Last Year: Never true    Ran Out of Food in the Last Year: Never true  Transportation Needs: No Transportation Needs (08/29/2023)   PRAPARE - Transportation    Lack of Transportation (Medical): No    Lack of Transportation (Non-Medical): No  Physical Activity: Insufficiently Active (08/29/2023)   Exercise Vital Sign    Days of Exercise per Week: 1 day    Minutes of Exercise per Session: 10 min  Stress: Stress Concern Present (08/29/2023)   [employer] of Occupational Health - Occupational Stress Questionnaire    Feeling of  Stress : To some extent  Social Connections: Moderately Integrated (08/29/2023)   Social Connection and Isolation Panel    Frequency of Communication with Friends and Family: Once a week    Frequency of Social Gatherings with Friends and Family: Once a week    Attends Religious Services: More than 4 times per year    Active Member of [employer] or Organizations: Yes    Attends Banker Meetings: 1 to 4 times per year    Marital Status: Married    Review of Systems Per HPI  Objective:  BP (!) 102/50   Pulse (!) 57   Temp (!) 97.2 F (36.2 C)   Ht 5' (1.524 m)   Wt 142 lb (64.4 kg)   LMP 12/04/2008   SpO2  100%   BMI 27.73 kg/m      02/28/2024    9:00 AM 02/14/2024   10:21 AM 12/27/2023    9:38 AM  BP/Weight  Systolic BP 102 122 110  Diastolic BP 50 80 70  Wt. (Lbs) 142 139.25 135  BMI 27.73 kg/m2 27.2 kg/m2 25.51 kg/m2    Physical Exam Vitals and nursing note reviewed.  Constitutional:      General: She is not in acute distress.    Appearance: Normal appearance.  HENT:     Head: Normocephalic and atraumatic.  Eyes:     General:        Right eye: No discharge.        Left eye: No discharge.     Conjunctiva/sclera: Conjunctivae normal.  Cardiovascular:     Rate and Rhythm: Regular rhythm. Bradycardia present.  Pulmonary:     Effort: Pulmonary effort is normal.     Breath sounds: Normal breath sounds. No wheezing, rhonchi or rales.  Neurological:     Mental Status: She is alert.  Psychiatric:        Mood and Affect: Mood normal.        Behavior: Behavior normal.     Lab Results  Component Value Date   WBC 7.8 08/30/2023   HGB 13.7 08/30/2023   HCT 42.2 08/30/2023   PLT 191 08/30/2023   GLUCOSE 84 08/30/2023   CHOL 180 08/30/2023   TRIG 83 08/30/2023   HDL 79 08/30/2023   LDLCALC 86 08/30/2023   ALT 39 (H) 08/30/2023   AST 31 08/30/2023   NA 144 08/30/2023   K 4.6 08/30/2023   CL 106 08/30/2023   CREATININE 0.87 08/30/2023   BUN 12 08/30/2023   CO2 20 08/30/2023   TSH 1.990 05/08/2020   INR 1.0 06/06/2008   HGBA1C 6.0 (H) 08/30/2023     Assessment & Plan:  Essential hypertension Assessment & Plan: Experiencing dizziness.  BP 102/58 today.  Stopping atenolol .   Type 2 diabetes mellitus without complication, without long-term current use of insulin (HCC) Assessment & Plan: Has been stable.  A1c today to assess.  Orders: -     CMP14+EGFR -     Hemoglobin A1c -     Microalbumin / creatinine urine ratio  Hyperlipidemia associated with type 2 diabetes mellitus (HCC) Assessment & Plan: Lipid panel today.  Continue Crestor  and Zetia .  Orders: -      Lipid panel    Follow-up:  6 months  Caryle Helgeson Bluford DO [facility]

## 2024-02-28 NOTE — Assessment & Plan Note (Signed)
Lipid panel today.  Continue Crestor and Zetia.

## 2024-02-28 NOTE — Patient Instructions (Signed)
 Stop atenolol .  Labs today.  Follow up in 6 months.

## 2024-02-29 ENCOUNTER — Ambulatory Visit: Payer: Self-pay | Admitting: Family Medicine

## 2024-02-29 LAB — CMP14+EGFR
ALT: 79 IU/L — ABNORMAL HIGH (ref 0–32)
AST: 48 IU/L — ABNORMAL HIGH (ref 0–40)
Albumin: 4.3 g/dL (ref 3.9–4.9)
Alkaline Phosphatase: 83 IU/L (ref 44–121)
BUN/Creatinine Ratio: 10 — ABNORMAL LOW (ref 12–28)
BUN: 10 mg/dL (ref 8–27)
Bilirubin Total: 0.3 mg/dL (ref 0.0–1.2)
CO2: 23 mmol/L (ref 20–29)
Calcium: 9.1 mg/dL (ref 8.7–10.3)
Chloride: 104 mmol/L (ref 96–106)
Creatinine, Ser: 1.01 mg/dL — ABNORMAL HIGH (ref 0.57–1.00)
Globulin, Total: 2.3 g/dL (ref 1.5–4.5)
Glucose: 86 mg/dL (ref 70–99)
Potassium: 4.1 mmol/L (ref 3.5–5.2)
Sodium: 142 mmol/L (ref 134–144)
Total Protein: 6.6 g/dL (ref 6.0–8.5)
eGFR: 63 mL/min/1.73 (ref >59)

## 2024-02-29 LAB — LIPID PANEL
Chol/HDL Ratio: 2.1 ratio (ref 0.0–4.4)
Cholesterol, Total: 144 mg/dL (ref 100–199)
HDL: 70 mg/dL (ref >39)
LDL Chol Calc (NIH): 62 mg/dL (ref 0–99)
Triglycerides: 56 mg/dL (ref 0–149)
VLDL Cholesterol Cal: 12 mg/dL (ref 5–40)

## 2024-02-29 LAB — HEMOGLOBIN A1C
Est. average glucose Bld gHb Est-mCnc: 120 mg/dL
Hgb A1c MFr Bld: 5.8 % — ABNORMAL HIGH (ref 4.8–5.6)

## 2024-02-29 LAB — MICROALBUMIN / CREATININE URINE RATIO
Creatinine, Urine: 54.4 mg/dL
Microalb/Creat Ratio: 10 mg/g{creat} (ref 0–29)
Microalbumin, Urine: 5.2 ug/mL

## 2024-03-01 ENCOUNTER — Ambulatory Visit (INDEPENDENT_AMBULATORY_CARE_PROVIDER_SITE_OTHER)

## 2024-03-01 DIAGNOSIS — J309 Allergic rhinitis, unspecified: Secondary | ICD-10-CM | POA: Diagnosis not present

## 2024-03-08 ENCOUNTER — Ambulatory Visit (INDEPENDENT_AMBULATORY_CARE_PROVIDER_SITE_OTHER): Payer: Self-pay

## 2024-03-08 DIAGNOSIS — J309 Allergic rhinitis, unspecified: Secondary | ICD-10-CM | POA: Diagnosis not present

## 2024-03-10 DIAGNOSIS — M25561 Pain in right knee: Secondary | ICD-10-CM | POA: Diagnosis not present

## 2024-03-10 DIAGNOSIS — M25562 Pain in left knee: Secondary | ICD-10-CM | POA: Diagnosis not present

## 2024-03-10 DIAGNOSIS — G8929 Other chronic pain: Secondary | ICD-10-CM | POA: Diagnosis not present

## 2024-03-15 ENCOUNTER — Ambulatory Visit (INDEPENDENT_AMBULATORY_CARE_PROVIDER_SITE_OTHER): Payer: Self-pay

## 2024-03-15 DIAGNOSIS — J309 Allergic rhinitis, unspecified: Secondary | ICD-10-CM | POA: Diagnosis not present

## 2024-03-29 ENCOUNTER — Ambulatory Visit (INDEPENDENT_AMBULATORY_CARE_PROVIDER_SITE_OTHER): Payer: Self-pay

## 2024-03-29 DIAGNOSIS — J309 Allergic rhinitis, unspecified: Secondary | ICD-10-CM | POA: Diagnosis not present

## 2024-04-05 ENCOUNTER — Other Ambulatory Visit: Payer: Self-pay | Admitting: Family Medicine

## 2024-04-05 DIAGNOSIS — F32A Depression, unspecified: Secondary | ICD-10-CM

## 2024-04-07 ENCOUNTER — Ambulatory Visit (INDEPENDENT_AMBULATORY_CARE_PROVIDER_SITE_OTHER)

## 2024-04-07 DIAGNOSIS — J309 Allergic rhinitis, unspecified: Secondary | ICD-10-CM

## 2024-04-12 ENCOUNTER — Ambulatory Visit (INDEPENDENT_AMBULATORY_CARE_PROVIDER_SITE_OTHER): Payer: Self-pay

## 2024-04-12 DIAGNOSIS — J309 Allergic rhinitis, unspecified: Secondary | ICD-10-CM

## 2024-04-19 ENCOUNTER — Ambulatory Visit (INDEPENDENT_AMBULATORY_CARE_PROVIDER_SITE_OTHER): Payer: Self-pay

## 2024-04-19 DIAGNOSIS — J309 Allergic rhinitis, unspecified: Secondary | ICD-10-CM | POA: Diagnosis not present

## 2024-05-03 ENCOUNTER — Ambulatory Visit (INDEPENDENT_AMBULATORY_CARE_PROVIDER_SITE_OTHER): Payer: Self-pay

## 2024-05-03 DIAGNOSIS — J309 Allergic rhinitis, unspecified: Secondary | ICD-10-CM

## 2024-05-05 ENCOUNTER — Ambulatory Visit

## 2024-05-15 ENCOUNTER — Ambulatory Visit: Payer: Self-pay

## 2024-05-15 ENCOUNTER — Encounter: Payer: Self-pay | Admitting: Family Medicine

## 2024-05-15 DIAGNOSIS — Z1231 Encounter for screening mammogram for malignant neoplasm of breast: Secondary | ICD-10-CM

## 2024-05-15 NOTE — Telephone Encounter (Signed)
 Appointment scheduled.

## 2024-05-15 NOTE — Telephone Encounter (Signed)
 FYI Only or Action Required?: FYI only for provider: appointment scheduled on 05/16/2024.  Patient was last seen in primary care on 02/28/2024 by Cook, Jayce G, DO.  Called Nurse Triage reporting Breast Pain.  Symptoms began about a month ago.  Interventions attempted: Nothing.  Symptoms are: gradually worsening.  Triage Disposition: See PCP Within 2 Weeks  Patient/caregiver understands and will follow disposition?: Yes        Copied from CRM [id number]. Topic: Clinical - Red Word Triage >> May 15, 2024  2:13 PM [name] [name] wrote: Red Word that prompted transfer to Nurse Triage: Patient has history of breast cancer on right side, and states now she is having lots of tenderness and very uncomfortable ,sometimes throbbing feeling under her right arm pit . Reason for Disposition  [1] Breast pain AND [2] cause is not known  Answer Assessment - Initial Assessment Questions 1. SYMPTOM: What's the main symptom you're concerned about?  (e.g., lump, nipple discharge, pain, rash)     Pain that she states is mild in nature 2. LOCATION: Where is the pain located?     L side near armpit area  3. ONSET: When did pain  start?     1 month ago  4. PRIOR HISTORY: Do you have any history of prior problems with your breasts? (e.g., breast cancer, breast implant, fibrocystic breast disease)     Yes a hx of breast cancer 5. OTHER SYMPTOMS: Do you have any other symptoms? (e.g., breast pain, fever, nipple discharge, redness or rash)     Denies  Protocols used: Breast Symptoms-A-AH

## 2024-05-16 ENCOUNTER — Encounter: Payer: Self-pay | Admitting: Family Medicine

## 2024-05-16 ENCOUNTER — Ambulatory Visit (INDEPENDENT_AMBULATORY_CARE_PROVIDER_SITE_OTHER): Payer: Self-pay | Admitting: Family Medicine

## 2024-05-16 VITALS — BP 127/72 | HR 81 | Temp 97.3°F | Ht 60.0 in | Wt 152.0 lb

## 2024-05-16 DIAGNOSIS — N644 Mastodynia: Secondary | ICD-10-CM | POA: Diagnosis not present

## 2024-05-16 DIAGNOSIS — Z23 Encounter for immunization: Secondary | ICD-10-CM | POA: Diagnosis not present

## 2024-05-16 NOTE — Assessment & Plan Note (Signed)
 Suspect that this is related to scar tissue.  Arranging for diagnostic mammogram.

## 2024-05-16 NOTE — Patient Instructions (Signed)
 We will arrange mammogram and likely US .  Monitor closely.

## 2024-05-16 NOTE — Progress Notes (Signed)
 Subjective:  Patient ID: [name] [name], female    DOB: May 23, 1961  Age: 63 y.o. MRN: [id number]  CC:   Chief Complaint  Patient presents with   left breast throbbing , intermittent pain     2 months tender and heaviness , no other changes reported    HPI:  63 year old female with a remote history of left breast cancer presents for evaluation of the above.  1 to 36-month history of intermittent throbbing pain and tenderness of the left breast at the area of prior incision which is near the axilla.  No nipple discharge.  No appreciable mass.  Mammogram up-to-date.  She is not due for screening mammogram until December.  No skin changes.  No redness or fluctuance.  No fever.  Patient Active Problem List   Diagnosis Date Noted   Breast pain, left 05/16/2024   Anxiety and depression 07/27/2022   Allergic rhinitis 07/15/2021   NAFLD (nonalcoholic fatty liver disease) 88/90/7977   Hyperlipidemia associated with type 2 diabetes mellitus (HCC) 03/22/2017   Diabetes (HCC)    IBS (irritable bowel syndrome) 12/10/2015   History of Chiari malformation 08/23/2014   Malignant neoplasm of upper-outer quadrant of left breast in female, estrogen receptor positive (HCC) 04/06/2013   Obesity (BMI 30-39.9) 12/14/2012   GERD (gastroesophageal reflux disease) 12/14/2012   Essential hypertension 09/02/2012    Social Hx   Social History   Socioeconomic History   Marital status: Married    Spouse name: Ubaldo   Number of children: 4   Years of education: Not on file   Highest education level: 12th grade  Occupational History   Occupation: Marine Scientist work    Associate Professor: NURSE, [employer]ULT [employer]  Tobacco Use   Smoking status: Former    Current packs/day: 0.00    Average packs/day: 0.5 packs/day for 15.0 years (7.5 ttl pk-yrs)    Types: Cigarettes    Start date: 07/07/1967    Quit date: 07/06/1982    Years since quitting: 41.8    Passive exposure: Never   Smokeless tobacco: Never  Vaping Use   Vaping  status: Never Used  Substance and Sexual Activity   Alcohol use: No   Drug use: No   Sexual activity: Yes    Partners: Male    Birth control/protection: Surgical    Comment: TAH  Other Topics Concern   Not on file  Social History Narrative   4 biological children   4 step children   13 grandchildren    1 great grandchildren   2 foster children   Social Drivers of Corporate Investment Banker Strain: Low Risk  (01/11/2023)   Overall Financial Resource Strain (CARDIA)    Difficulty of Paying Living Expenses: Not very hard  Food Insecurity: No Food Insecurity (08/29/2023)   Hunger Vital Sign    Worried About Running Out of Food in the Last Year: Never true    Ran Out of Food in the Last Year: Never true  Transportation Needs: No Transportation Needs (08/29/2023)   PRAPARE - Transportation    Lack of Transportation (Medical): No    Lack of Transportation (Non-Medical): No  Physical Activity: Insufficiently Active (08/29/2023)   Exercise Vital Sign    Days of Exercise per Week: 1 day    Minutes of Exercise per Session: 10 min  Stress: Stress Concern Present (08/29/2023)   [employer] of Occupational Health - Occupational Stress Questionnaire    Feeling of Stress : To some extent  Social Connections:  Moderately Integrated (08/29/2023)   Social Connection and Isolation Panel    Frequency of Communication with Friends and Family: Once a week    Frequency of Social Gatherings with Friends and Family: Once a week    Attends Religious Services: More than 4 times per year    Active Member of [employer] or Organizations: Yes    Attends Banker Meetings: 1 to 4 times per year    Marital Status: Married    Review of Systems Per HPI  Objective:  BP 127/72   Pulse 81   Temp (!) 97.3 F (36.3 C)   Ht 5' (1.524 m)   Wt 152 lb (68.9 kg)   LMP 12/04/2008   SpO2 99%   BMI 29.69 kg/m      05/16/2024   10:57 AM 02/28/2024    9:00 AM 02/14/2024   10:21 AM  BP/Weight   Systolic BP 127 102 122  Diastolic BP 72 50 80  Wt. (Lbs) 152 142 139.25  BMI 29.69 kg/m2 27.73 kg/m2 27.2 kg/m2    Physical Exam Vitals and nursing note reviewed. Exam conducted with a chaperone present (Betzy present for exam.).  Constitutional:      General: She is not in acute distress.    Appearance: Normal appearance.  HENT:     Head: Normocephalic and atraumatic.  Pulmonary:     Effort: Pulmonary effort is normal. No respiratory distress.  Chest:       Comments: Area of tenderness at the labeled location which is at her prior incision site. No appreciable mass.  No nipple changes.  No appreciable adenopathy. Neurological:     Mental Status: She is alert.  Psychiatric:        Mood and Affect: Mood normal.        Behavior: Behavior normal.     Lab Results  Component Value Date   WBC 7.8 08/30/2023   HGB 13.7 08/30/2023   HCT 42.2 08/30/2023   PLT 191 08/30/2023   GLUCOSE 86 02/28/2024   CHOL 144 02/28/2024   TRIG 56 02/28/2024   HDL 70 02/28/2024   LDLCALC 62 02/28/2024   ALT 79 (H) 02/28/2024   AST 48 (H) 02/28/2024   NA 142 02/28/2024   K 4.1 02/28/2024   CL 104 02/28/2024   CREATININE 1.01 (H) 02/28/2024   BUN 10 02/28/2024   CO2 23 02/28/2024   TSH 1.990 05/08/2020   INR 1.0 06/06/2008   HGBA1C 5.8 (H) 02/28/2024     Assessment & Plan:  Breast pain, left Assessment & Plan: Suspect that this is related to scar tissue.  Arranging for diagnostic mammogram.  Orders: -     MM 3D DIAGNOSTIC MAMMOGRAM UNILATERAL LEFT BREAST  Immunization due -     Flu vaccine trivalent PF, 6mos and older(Flulaval,Afluria,Fluarix,Fluzone)    Follow-up:  Pending work up  [employer] DO [facility]

## 2024-05-17 ENCOUNTER — Other Ambulatory Visit: Payer: Self-pay | Admitting: Family Medicine

## 2024-05-17 ENCOUNTER — Ambulatory Visit (INDEPENDENT_AMBULATORY_CARE_PROVIDER_SITE_OTHER)

## 2024-05-17 DIAGNOSIS — N644 Mastodynia: Secondary | ICD-10-CM

## 2024-05-17 DIAGNOSIS — J309 Allergic rhinitis, unspecified: Secondary | ICD-10-CM

## 2024-05-20 ENCOUNTER — Ambulatory Visit
Admission: RE | Admit: 2024-05-20 | Discharge: 2024-05-20 | Disposition: A | Source: Ambulatory Visit | Attending: Family Medicine | Admitting: Family Medicine

## 2024-05-20 ENCOUNTER — Ambulatory Visit

## 2024-05-20 DIAGNOSIS — R921 Mammographic calcification found on diagnostic imaging of breast: Secondary | ICD-10-CM | POA: Diagnosis not present

## 2024-05-24 NOTE — Progress Notes (Signed)
 VIAL MADE ON 05/25/24

## 2024-05-25 ENCOUNTER — Other Ambulatory Visit: Payer: Self-pay | Admitting: Family Medicine

## 2024-05-25 DIAGNOSIS — J301 Allergic rhinitis due to pollen: Secondary | ICD-10-CM | POA: Diagnosis not present

## 2024-05-25 DIAGNOSIS — J3089 Other allergic rhinitis: Secondary | ICD-10-CM | POA: Diagnosis not present

## 2024-05-31 ENCOUNTER — Ambulatory Visit (INDEPENDENT_AMBULATORY_CARE_PROVIDER_SITE_OTHER)

## 2024-05-31 DIAGNOSIS — J309 Allergic rhinitis, unspecified: Secondary | ICD-10-CM

## 2024-06-07 ENCOUNTER — Ambulatory Visit

## 2024-06-07 NOTE — Progress Notes (Unsigned)
 This encounter was created in error - please disregard.

## 2024-06-14 ENCOUNTER — Ambulatory Visit (INDEPENDENT_AMBULATORY_CARE_PROVIDER_SITE_OTHER)

## 2024-06-14 DIAGNOSIS — J309 Allergic rhinitis, unspecified: Secondary | ICD-10-CM

## 2024-06-14 DIAGNOSIS — J302 Other seasonal allergic rhinitis: Secondary | ICD-10-CM

## 2024-06-24 ENCOUNTER — Other Ambulatory Visit: Payer: Self-pay | Admitting: Internal Medicine

## 2024-07-12 ENCOUNTER — Ambulatory Visit

## 2024-07-12 DIAGNOSIS — J302 Other seasonal allergic rhinitis: Secondary | ICD-10-CM

## 2024-07-28 ENCOUNTER — Ambulatory Visit (INDEPENDENT_AMBULATORY_CARE_PROVIDER_SITE_OTHER)

## 2024-07-28 DIAGNOSIS — J302 Other seasonal allergic rhinitis: Secondary | ICD-10-CM | POA: Diagnosis not present

## 2024-08-04 ENCOUNTER — Ambulatory Visit

## 2024-08-04 DIAGNOSIS — J302 Other seasonal allergic rhinitis: Secondary | ICD-10-CM | POA: Diagnosis not present

## 2024-08-09 ENCOUNTER — Ambulatory Visit

## 2024-08-09 DIAGNOSIS — J302 Other seasonal allergic rhinitis: Secondary | ICD-10-CM

## 2024-08-10 ENCOUNTER — Other Ambulatory Visit: Payer: Self-pay | Admitting: Medical Genetics

## 2024-08-10 NOTE — Patient Instructions (Incomplete)
 [name] [name],  Thank you for taking the time for your Medicare Wellness Visit. I appreciate your continued commitment to your health goals. Please review the care plan we discussed, and feel free to reach out if I can assist you further.  Please note that Annual Wellness Visits do not include a physical exam. Some assessments may be limited, especially if the visit was conducted virtually. If needed, we may recommend an in-person follow-up with your provider.  Ongoing Care Seeing your primary care provider every 3 to 6 months helps us  monitor your health and provide consistent, personalized care.   Referrals If a referral was made during today's visit and you haven't received any updates within two weeks, please contact the referred provider directly to check on the status.  Recommended Screenings:  Health Maintenance  Topic Date Due   DTaP/Tdap/Td vaccine (1 - Tdap) Never done   Zoster (Shingles) Vaccine (1 of 2) Never done   Pneumococcal Vaccine for age over 76 (2 of 2 - PCV) 10/19/2018   Eye exam for diabetics  10/13/2023   COVID-19 Vaccine (5 - 2025-26 season) 03/06/2024   Complete foot exam   08/29/2024   Kidney health urinalysis for diabetes  08/30/2024   Hemoglobin A1C  08/30/2024   Medicare Annual Wellness Visit  11/16/2024   Yearly kidney function blood test for diabetes  02/27/2025   Breast Cancer Screening  05/20/2025   Colon Cancer Screening  08/23/2033   Flu Shot  Completed   HPV Vaccine (No Doses Required) Completed   Hepatitis C Screening  Completed   HIV Screening  Completed   Hepatitis B Vaccine  Aged Out   Meningitis B Vaccine  Aged Out       08/24/2023    8:33 AM  Advanced Directives  Does Patient Have a Medical Advance Directive? No  Would patient like information on creating a medical advance directive? No - Patient declined   Information on Advanced Care Planning can be found at [place]  Secretary of [facility] Advance Health Care Directives Advance  Health Care Directives ([email or web address]   Vision: Annual vision screenings are recommended for early detection of glaucoma, cataracts, and diabetic retinopathy. These exams can also reveal signs of chronic conditions such as diabetes and high blood pressure.  Dental: Annual dental screenings help detect early signs of oral cancer, gum disease, and other conditions linked to overall health, including heart disease and diabetes.  Please see the attached documents for additional preventive care recommendations.

## 2024-08-11 ENCOUNTER — Ambulatory Visit

## 2024-08-11 VITALS — BP 126/70 | HR 68 | Ht 60.0 in | Wt 163.8 lb

## 2024-08-11 DIAGNOSIS — Z0000 Encounter for general adult medical examination without abnormal findings: Secondary | ICD-10-CM

## 2024-08-11 NOTE — Progress Notes (Cosign Needed)
 "  Chief Complaint  Patient presents with   Medicare Wellness     Subjective:   [name] [name] is a 64 y.o. female who presents for a Medicare Annual Wellness Visit.  Visit info / Clinical Intake: Medicare Wellness Visit Type:: Subsequent Annual Wellness Visit Persons participating in visit and providing information:: patient Medicare Wellness Visit Mode:: In-person (required for WTM) Interpreter Needed?: No Pre-visit prep was completed: yes AWV questionnaire completed by patient prior to visit?: no Living arrangements:: lives with spouse/significant other Patient's Overall Health Status Rating: good Typical amount of pain: some Does pain affect daily life?: no Are you currently prescribed opioids?: no  Dietary Habits and Nutritional Risks How many meals a day?: 3 Eats fruit and vegetables daily?: yes Most meals are obtained by: preparing own meals; having others provide food In the last 2 weeks, have you had any of the following?: none Diabetic:: (!) yes Any non-healing wounds?: no How often do you check your BS?: 1 Would you like to be referred to a Nutritionist or for Diabetic Management? : no  Functional Status Activities of Daily Living (to include ambulation/medication): Independent Ambulation: Independent Medication Administration: Independent Home Management (perform basic housework or laundry): Independent Manage your own finances?: yes Primary transportation is: driving Concerns about vision?: no *vision screening is required for WTM* Concerns about hearing?: no  Fall Screening Falls in the past year?: 0 Number of falls in past year: 0 Was there an injury with Fall?: 0 Fall Risk Category Calculator: 0 Patient Fall Risk Level: Low Fall Risk  Fall Risk Patient at Risk for Falls Due to: No Fall Risks Fall risk Follow up: Falls evaluation completed; Education provided; Falls prevention discussed  Home and Transportation Safety: All rugs have non-skid  backing?: yes All stairs or steps have railings?: yes Grab bars in the bathtub or shower?: yes Have non-skid surface in bathtub or shower?: yes Good home lighting?: yes Regular seat belt use?: yes Hospital stays in the last year:: no  Cognitive Assessment Difficulty concentrating, remembering, or making decisions? : no Will 6CIT or Mini Cog be Completed: no 6CIT or Mini Cog Declined: patient alert, oriented, able to answer questions appropriately and recall recent events  Advance Directives (For Healthcare) Does Patient Have a Medical Advance Directive?: No Would patient like information on creating a medical advance directive?: Yes (MAU/Ambulatory/Procedural Areas - Information given)  Reviewed/Updated  Reviewed/Updated: Reviewed All (Medical, Surgical, Family, Medications, Allergies, Care Teams, Patient Goals)    Allergies (verified) Doxycycline   Current Medications (verified) Outpatient Encounter Medications as of 08/11/2024  Medication Sig   azelastine  (ASTELIN ) 0.1 % nasal spray Place 2 sprays into both nostrils 2 (two) times daily as needed for allergies or rhinitis. Use in each nostril as directed   buPROPion  (WELLBUTRIN  XL) 150 MG 24 hr tablet TAKE 1 TABLET BY MOUTH EVERY DAY   cetirizine  (ZYRTEC ) 10 MG tablet Take 1 tablet (10 mg total) by mouth 2 (two) times daily as needed for allergies.   EPINEPHrine  0.3 mg/0.3 mL IJ SOAJ injection Inject 0.3 mg into the muscle as needed for anaphylaxis.   escitalopram  (LEXAPRO ) 20 MG tablet TAKE 1 TABLET BY MOUTH EVERY DAY   ezetimibe  (ZETIA ) 10 MG tablet Take 1 tablet (10 mg total) by mouth daily.   famotidine  (PEPCID ) 40 MG tablet Take 1 tablet (40 mg total) by mouth at bedtime.   Melatonin 10 MG TABS Take 10 mg by mouth at bedtime.   montelukast  (SINGULAIR ) 10 MG tablet Take 1  tablet (10 mg total) by mouth daily.   pantoprazole  (PROTONIX ) 40 MG tablet Take 1 tablet (40 mg total) by mouth daily.   rosuvastatin  (CRESTOR ) 40 MG  tablet TAKE 1 TABLET BY MOUTH EVERY DAY   valACYclovir  (VALTREX ) 500 MG tablet TAKE 1 TABLET (500 MG TOTAL) BY MOUTH DAILY.   [DISCONTINUED] potassium chloride  SA (K-DUR,KLOR-CON ) 20 MEQ tablet Take 1 tablet (20 mEq total) by mouth 2 (two) times daily.   No facility-administered encounter medications on file as of 08/11/2024.    History: Past Medical History:  Diagnosis Date   Allergic rhinitis    Allergy     Anemia, unspecified 05/30/2013   Anxiety    Breast cancer (HCC) 03/31/2013   left   Cancer (HCC)    Chest pain 2009   Consultation-[facility], negative chest CT; nl echo in 2005; h/o palpitations   Chiari malformation type I (HCC)    s/p suboccipital craniectomy, C1 laminectomy, superior C2 laminectomy, duraplasty 05/10/14   Colitis 2010   not IBD   Degenerative joint disease    + degenerative joint disease of the lumbosacral spine   Depression    Diabetes (HCC)    Diabetes (HCC)    Dysrhythmia    palpations   Genetic testing 07/01/2016   GERD (gastroesophageal reflux disease)    a little   Headache    Heart murmur    small   Herpes simplex type II infection    Hypercholesteremia    slightly high   Hypertension    does not have high blood pressure   Meningitis 08/22/2014   Palpitations    Personal history of chemotherapy    Personal history of radiation therapy    Urticaria    Wears glasses    Past Surgical History:  Procedure Laterality Date   ABDOMINAL HYSTERECTOMY  03/13/2007   TAH ?BSO--Dr Jayne Chester,    BIOPSY  01/14/2021   Procedure: BIOPSY;  Surgeon: Cindie Carlin POUR, DO;  Location: [facility];  Service: Endoscopy;;   BRAIN SURGERY     BREAST EXCISIONAL BIOPSY  04/2003   Left; benign disease   BREAST LUMPECTOMY Left 09/2013   BREAST LUMPECTOMY WITH NEEDLE LOCALIZATION AND AXILLARY LYMPH NODE DISSECTION Left 09/12/2013   Procedure: BREAST LUMPECTOMY WITH NEEDLE LOCALIZATION AND AXILLARY LYMPH NODE DISSECTION;  Surgeon: Alm VEAR Angle,  MD;  Location: [facility];  Service: General;  Laterality: Left;  and axilla   BREAST SURGERY     COLONOSCOPY  10/2010   proctitis; melanosis coli   COLONOSCOPY WITH PROPOFOL  N/A 01/04/2018   Dr. Harvey: hemorrhoids, follow up colonoscopy in 10 years   COLONOSCOPY WITH PROPOFOL  N/A 08/24/2023   Procedure: COLONOSCOPY WITH PROPOFOL ;  Surgeon: Cindie Carlin POUR, DO;  Location: [facility];  Service: Endoscopy;  Laterality: N/A;  10:00AM, ASA 2   EAR CYST EXCISION Right 09/04/2019   Procedure: EXCISION EAR MASS;  Surgeon: Karis Clunes, MD;  Location: [facility];  Service: ENT;  Laterality: Right;   ESOPHAGOGASTRODUODENOSCOPY (EGD) WITH PROPOFOL  N/A 01/14/2021   Procedure: ESOPHAGOGASTRODUODENOSCOPY (EGD) WITH PROPOFOL ;  Surgeon: Cindie Carlin POUR, DO;  Location: [facility];  Service: Endoscopy;  Laterality: N/A;  9:00am   EXCISION/RELEASE BURSA HIP Left 03/17/2019   Procedure: Left hip open bursectomy;  Surgeon: Sharl Selinda Dover, MD;  Location: [facility];  Service: Orthopedics;  Laterality: Left;  75 mins   EYE SURGERY Left    fix lazy eye   PORTACATH PLACEMENT Right 04/21/2013  Procedure: INSERTION PORT-A-CATH;  Surgeon: Alm VEAR Angle, MD;  Location: [facility];  Service: General;  Laterality: Right;   RIGHT OOPHORECTOMY  03/13/2007   SHOULDER ARTHROSCOPY WITH DISTAL CLAVICLE RESECTION Right 10/09/2022   Procedure: SHOULDER ARTHROSCOPY WITH DISTAL CLAVICLE RESECTION;  Surgeon: Sharl Selinda Dover, MD;  Location: [facility];  Service: Orthopedics;  Laterality: Right;  120   SHOULDER ARTHROSCOPY WITH ROTATOR CUFF REPAIR AND SUBACROMIAL DECOMPRESSION Right 10/09/2022   Procedure: SHOULDER ARTHROSCOPY WITH ROTATOR CUFF REPAIR AND SUBACROMIAL DECOMPRESSION;  Surgeon: Sharl Selinda Dover, MD;  Location: [facility];  Service: Orthopedics;  Laterality: Right;   SUBOCCIPITAL CRANIECTOMY CERVICAL LAMINECTOMY N/A 05/10/2014   Procedure:  SUBOCCIPITAL CRANIECTOMY CERVICAL LAMINECTOMY/DURAPLASTY;  Surgeon: Lamar LELON Peaches, MD;  Location: [facility];  Service: Neurosurgery;  Laterality: N/A;  suboccipital craniectomy with cervical laminectomy and duraplasty   TUBAL LIGATION     Family History  Problem Relation Age of Onset   Aneurysm Mother        Cerebral   Hypertension Mother    Hyperlipidemia Mother    Stroke Mother    Depression Mother    Coronary artery disease Father    Hypertension Father    Hyperlipidemia Father    Heart attack Father        d. 48   Lung cancer Father        dx early 45s; former smoker   Lung cancer Sister        paternal half-sister; not a smoker; dx <60; d. 41y   Breast cancer Sister 46   Other Sister        hx of hysterectomy for unspecified reason   Eczema Brother    Lung cancer Brother    Lung cancer Brother        d. 46y; smoker   Multiple myeloma Brother        d. 3s   Lung cancer Brother        dx late 22s; smoker   Arthritis Brother    Prostate cancer Brother        dx late 69s   Prostate cancer Brother        dx late 18s   Lung cancer Brother    Diabetes Brother    Leukemia Maternal Aunt        blood cancer; d. unspecified age   Lung cancer Maternal Aunt        d. unspecified age   Breast cancer Maternal Aunt        dx unspecified age   Cervical cancer Paternal Aunt        d. late 1s   Anxiety disorder Daughter    Prostate cancer Cousin        maternal 1st cousin; dx older age   Breast cancer Other        niece dx early 60s or before   Leukemia Other        nephew d. 2y; blood cancer   Colon cancer Neg Hx    Social History   Occupational History   Occupation: Marine Scientist work    Associate Professor: [employer]  Tobacco Use   Smoking status: Former    Current packs/day: 0.00    Average packs/day: 0.5 packs/day for 15.0 years (7.5 ttl pk-yrs)    Types: Cigarettes    Start date: 07/07/1967    Quit date: 07/06/1982    Years since quitting: 42.1    Passive  exposure: Never  Smokeless tobacco: Never  Vaping Use   Vaping status: Never Used  Substance and Sexual Activity   Alcohol use: No   Drug use: No   Sexual activity: Yes    Partners: Male    Birth control/protection: Surgical    Comment: TAH   Tobacco Counseling Counseling given: Not Answered  SDOH Screenings   Food Insecurity: No Food Insecurity (08/11/2024)  Housing: Low Risk (08/11/2024)  Transportation Needs: No Transportation Needs (08/11/2024)  Utilities: Not At Risk (08/11/2024)  Alcohol Screen: Low Risk (10/26/2022)  Depression (PHQ2-9): Low Risk (08/11/2024)  Financial Resource Strain: Low Risk (01/11/2023)  Physical Activity: Inactive (08/11/2024)  Social Connections: Socially Integrated (08/11/2024)  Stress: No Stress Concern Present (08/11/2024)  Tobacco Use: Medium Risk (08/11/2024)  Health Literacy: Adequate Health Literacy (08/11/2024)   See flowsheets for full screening details  Depression Screen PHQ 2 & 9 Depression Scale- Over the past 2 weeks, how often have you been bothered by any of the following problems? Little interest or pleasure in doing things: 0 Feeling down, depressed, or hopeless (PHQ Adolescent also includes...irritable): 0 PHQ-2 Total Score: 0 Trouble falling or staying asleep, or sleeping too much: 0 Feeling tired or having little energy: 0 Poor appetite or overeating (PHQ Adolescent also includes...weight loss): 0 Feeling bad about yourself - or that you are a failure or have let yourself or your family down: 0 Trouble concentrating on things, such as reading the newspaper or watching television (PHQ Adolescent also includes...like school work): 0 Moving or speaking so slowly that other people could have noticed. Or the opposite - being so fidgety or restless that you have been moving around a lot more than usual: 0 Thoughts that you would be better off dead, or of hurting yourself in some way: 0 PHQ-9 Total Score: 0 If you checked off any problems, how  difficult have these problems made it for you to do your work, take care of things at home, or get along with other people?: Not difficult at all     Goals Addressed             This Visit's Progress    Maintain health and independence   On track            Objective:    Today's Vitals   08/11/24 0837  BP: 126/70  Pulse: 68  SpO2: 98%  Weight: 163 lb 12.8 oz (74.3 kg)  Height: 5' (1.524 m)   Body mass index is 31.99 kg/m.  Hearing/Vision screen No results found. Immunizations and Health Maintenance Health Maintenance  Topic Date Due   DTaP/Tdap/Td (1 - Tdap) Never done   Zoster Vaccines- Shingrix (1 of 2) Never done   Pneumococcal Vaccine: 50+ Years (2 of 2 - PCV) 10/19/2018   OPHTHALMOLOGY EXAM  10/13/2023   COVID-19 Vaccine (5 - 2025-26 season) 03/06/2024   FOOT EXAM  08/29/2024   Diabetic kidney evaluation - Urine ACR  08/30/2024   HEMOGLOBIN A1C  08/30/2024   Diabetic kidney evaluation - eGFR measurement  02/27/2025   Mammogram  05/20/2025   Medicare Annual Wellness (AWV)  08/11/2025   Colonoscopy  08/23/2033   Influenza Vaccine  Completed   HPV VACCINES (No Doses Required) Completed   Hepatitis C Screening  Completed   HIV Screening  Completed   Hepatitis B Vaccines 19-59 Average Risk  Aged Out   Meningococcal B Vaccine  Aged Out        Assessment/Plan:  This is a routine wellness examination  for Jakai.  Patient Care Team: Cook, Jayce G, DO as PCP - General (Family Medicine) Sharl Selinda Dover, MD as Consulting Physician (Orthopedic Surgery) Mauro Elveria BROCKS, NP (Family Medicine) Cindie Carlin POUR, DO as Consulting Physician (Gastroenterology) Crawford, Morna Pickle, NP as Nurse Practitioner (Hematology and Oncology) Vicci Mcardle, [place] (Optometry) Iva Marty Saltness, MD as Consulting Physician (Allergy  and Immunology) Sherrilee Dover CROME, MD as Consulting Physician (Urology)  I have personally reviewed and noted the following in  the patients chart:   Medical and social history Use of alcohol, tobacco or illicit drugs  Current medications and supplements including opioid prescriptions. Functional ability and status Nutritional status Physical activity Advanced directives List of other physicians Hospitalizations, surgeries, and ER visits in previous 12 months Vitals Screenings to include cognitive, depression, and falls Referrals and appointments  No orders of the defined types were placed in this encounter.  In addition, I have reviewed and discussed with patient certain preventive protocols, quality metrics, and best practice recommendations. A written personalized care plan for preventive services as well as general preventive health recommendations were provided to patient.   Lavelle Charmaine Browner, LPN   01/06/7972   Return in 1 year (on 08/11/2025).  After Visit Summary: (In Person-Printed) AVS printed and given to the patient  Nurse Notes: Patient advised to keep follow-up appointment with PCP (08/30/24); patient with concerns that blood sugar has been elevated x 1 month.  Not currently on any diabetic medications, notes that she has recently had a weight gain due to diet changes and also notes polyuria.    "

## 2024-08-14 ENCOUNTER — Ambulatory Visit: Admitting: Internal Medicine

## 2024-08-14 ENCOUNTER — Ambulatory Visit: Admitting: Family Medicine

## 2024-08-23 ENCOUNTER — Other Ambulatory Visit (HOSPITAL_COMMUNITY)

## 2024-08-30 ENCOUNTER — Ambulatory Visit: Admitting: Family Medicine

## 2024-09-25 ENCOUNTER — Ambulatory Visit: Admitting: Urology
